# Patient Record
Sex: Female | Born: 1935 | Race: White | Hispanic: No | Smoking: Never smoker
Health system: Southern US, Community
[De-identification: ages and names within clinical notes are randomized; demographics above are authoritative.]

## PROBLEM LIST (undated history)

## (undated) DIAGNOSIS — Z9289 Personal history of other medical treatment: Secondary | ICD-10-CM

## (undated) DIAGNOSIS — K5730 Diverticulosis of large intestine without perforation or abscess without bleeding: Secondary | ICD-10-CM

## (undated) DIAGNOSIS — K254 Chronic or unspecified gastric ulcer with hemorrhage: Secondary | ICD-10-CM

## (undated) DIAGNOSIS — I10 Essential (primary) hypertension: Secondary | ICD-10-CM

## (undated) DIAGNOSIS — Z950 Presence of cardiac pacemaker: Secondary | ICD-10-CM

## (undated) DIAGNOSIS — C439 Malignant melanoma of skin, unspecified: Secondary | ICD-10-CM

## (undated) DIAGNOSIS — I459 Conduction disorder, unspecified: Secondary | ICD-10-CM

## (undated) DIAGNOSIS — M810 Age-related osteoporosis without current pathological fracture: Secondary | ICD-10-CM

## (undated) DIAGNOSIS — E785 Hyperlipidemia, unspecified: Secondary | ICD-10-CM

## (undated) DIAGNOSIS — R51 Headache: Secondary | ICD-10-CM

## (undated) DIAGNOSIS — I4891 Unspecified atrial fibrillation: Secondary | ICD-10-CM

## (undated) DIAGNOSIS — N951 Menopausal and female climacteric states: Secondary | ICD-10-CM

## (undated) DIAGNOSIS — C801 Malignant (primary) neoplasm, unspecified: Secondary | ICD-10-CM

## (undated) HISTORY — PX: BUNIONECTOMY: SHX129

## (undated) HISTORY — PX: CATARACT EXTRACTION W/ INTRAOCULAR LENS  IMPLANT, BILATERAL: SHX1307

## (undated) HISTORY — DX: Malignant melanoma of skin, unspecified: C43.9

## (undated) HISTORY — PX: APPENDECTOMY: SHX54

## (undated) HISTORY — DX: Age-related osteoporosis without current pathological fracture: M81.0

## (undated) HISTORY — DX: Conduction disorder, unspecified: I45.9

## (undated) HISTORY — PX: FRACTURE SURGERY: SHX138

## (undated) HISTORY — DX: Hyperlipidemia, unspecified: E78.5

## (undated) HISTORY — DX: Diverticulosis of large intestine without perforation or abscess without bleeding: K57.30

## (undated) HISTORY — PX: CHOLECYSTECTOMY OPEN: SUR202

## (undated) HISTORY — PX: PACEMAKER INSERTION: SHX728

## (undated) HISTORY — DX: Menopausal and female climacteric states: N95.1

---

## 1997-09-01 ENCOUNTER — Other Ambulatory Visit: Admission: RE | Admit: 1997-09-01 | Discharge: 1997-09-01 | Payer: Self-pay | Admitting: Obstetrics and Gynecology

## 1998-05-23 ENCOUNTER — Emergency Department (HOSPITAL_COMMUNITY): Admission: EM | Admit: 1998-05-23 | Discharge: 1998-05-23 | Payer: Self-pay | Admitting: Emergency Medicine

## 1998-05-23 ENCOUNTER — Encounter: Payer: Self-pay | Admitting: Emergency Medicine

## 2001-04-22 ENCOUNTER — Other Ambulatory Visit: Admission: RE | Admit: 2001-04-22 | Discharge: 2001-04-22 | Payer: Self-pay | Admitting: Internal Medicine

## 2003-01-06 ENCOUNTER — Encounter (INDEPENDENT_AMBULATORY_CARE_PROVIDER_SITE_OTHER): Payer: Self-pay

## 2003-01-06 ENCOUNTER — Observation Stay (HOSPITAL_COMMUNITY): Admission: RE | Admit: 2003-01-06 | Discharge: 2003-01-07 | Payer: Self-pay | Admitting: *Deleted

## 2004-04-05 ENCOUNTER — Other Ambulatory Visit: Admission: RE | Admit: 2004-04-05 | Discharge: 2004-04-05 | Payer: Self-pay | Admitting: Obstetrics and Gynecology

## 2004-05-20 ENCOUNTER — Ambulatory Visit: Payer: Self-pay | Admitting: Internal Medicine

## 2004-07-05 ENCOUNTER — Ambulatory Visit: Payer: Self-pay | Admitting: Internal Medicine

## 2004-12-14 ENCOUNTER — Ambulatory Visit: Payer: Self-pay | Admitting: Internal Medicine

## 2005-08-03 ENCOUNTER — Ambulatory Visit: Payer: Self-pay | Admitting: Internal Medicine

## 2005-08-10 ENCOUNTER — Ambulatory Visit: Payer: Self-pay | Admitting: Internal Medicine

## 2005-08-23 ENCOUNTER — Ambulatory Visit: Payer: Self-pay | Admitting: Internal Medicine

## 2005-09-05 ENCOUNTER — Ambulatory Visit: Payer: Self-pay | Admitting: Internal Medicine

## 2005-09-05 ENCOUNTER — Encounter: Payer: Self-pay | Admitting: Internal Medicine

## 2005-10-17 ENCOUNTER — Ambulatory Visit: Payer: Self-pay | Admitting: Internal Medicine

## 2006-07-10 ENCOUNTER — Encounter: Payer: Self-pay | Admitting: Internal Medicine

## 2006-07-10 LAB — HM MAMMOGRAPHY

## 2006-07-11 ENCOUNTER — Encounter: Payer: Self-pay | Admitting: Internal Medicine

## 2006-08-07 ENCOUNTER — Ambulatory Visit: Payer: Self-pay | Admitting: Internal Medicine

## 2006-08-07 LAB — CONVERTED CEMR LAB
ALT: 20 units/L (ref 0–40)
AST: 20 units/L (ref 0–37)
Albumin: 4 g/dL (ref 3.5–5.2)
Alkaline Phosphatase: 79 units/L (ref 39–117)
BUN: 9 mg/dL (ref 6–23)
Basophils Absolute: 0 10*3/uL (ref 0.0–0.1)
Basophils Relative: 0.8 % (ref 0.0–1.0)
Bilirubin, Direct: 0.1 mg/dL (ref 0.0–0.3)
CO2: 30 meq/L (ref 19–32)
Calcium: 9.7 mg/dL (ref 8.4–10.5)
Chloride: 106 meq/L (ref 96–112)
Cholesterol: 162 mg/dL (ref 0–200)
Creatinine, Ser: 1 mg/dL (ref 0.4–1.2)
Eosinophils Absolute: 0.1 10*3/uL (ref 0.0–0.6)
Eosinophils Relative: 2 % (ref 0.0–5.0)
GFR calc Af Amer: 70 mL/min
GFR calc non Af Amer: 57 mL/min
Glucose, Bld: 112 mg/dL — ABNORMAL HIGH (ref 70–99)
HCT: 41.9 % (ref 36.0–46.0)
HDL: 51.2 mg/dL (ref >39.0)
Hemoglobin: 14.6 g/dL (ref 12.0–15.0)
Hgb A1c MFr Bld: 6.2 % — ABNORMAL HIGH (ref 4.6–6.0)
LDL Cholesterol: 85 mg/dL (ref 0–99)
Lymphocytes Relative: 32.5 % (ref 12.0–46.0)
MCHC: 34.7 g/dL (ref 30.0–36.0)
MCV: 87.6 fL (ref 78.0–100.0)
Monocytes Absolute: 0.4 10*3/uL (ref 0.2–0.7)
Monocytes Relative: 6.9 % (ref 3.0–11.0)
Neutro Abs: 3.6 10*3/uL (ref 1.4–7.7)
Neutrophils Relative %: 57.8 % (ref 43.0–77.0)
Platelets: 245 10*3/uL (ref 150–400)
Potassium: 4.8 meq/L (ref 3.5–5.1)
RBC: 4.78 M/uL (ref 3.87–5.11)
RDW: 12.9 % (ref 11.5–14.6)
Sodium: 141 meq/L (ref 135–145)
TSH: 1.59 microintl units/mL (ref 0.35–5.50)
Total Bilirubin: 0.9 mg/dL (ref 0.3–1.2)
Total CHOL/HDL Ratio: 3.2
Total Protein: 7.2 g/dL (ref 6.0–8.3)
Triglycerides: 131 mg/dL (ref 0–149)
VLDL: 26 mg/dL (ref 0–40)
WBC: 6.1 10*3/uL (ref 4.5–10.5)

## 2006-08-14 ENCOUNTER — Ambulatory Visit: Payer: Self-pay | Admitting: Internal Medicine

## 2006-11-22 ENCOUNTER — Encounter: Payer: Self-pay | Admitting: Internal Medicine

## 2006-11-22 ENCOUNTER — Ambulatory Visit: Payer: Self-pay | Admitting: Internal Medicine

## 2006-11-22 DIAGNOSIS — K5730 Diverticulosis of large intestine without perforation or abscess without bleeding: Secondary | ICD-10-CM | POA: Insufficient documentation

## 2006-11-22 DIAGNOSIS — E785 Hyperlipidemia, unspecified: Secondary | ICD-10-CM

## 2006-11-22 DIAGNOSIS — M810 Age-related osteoporosis without current pathological fracture: Secondary | ICD-10-CM | POA: Insufficient documentation

## 2006-11-22 HISTORY — DX: Age-related osteoporosis without current pathological fracture: M81.0

## 2006-11-22 HISTORY — DX: Diverticulosis of large intestine without perforation or abscess without bleeding: K57.30

## 2006-11-22 HISTORY — DX: Hyperlipidemia, unspecified: E78.5

## 2007-03-21 ENCOUNTER — Ambulatory Visit: Payer: Self-pay | Admitting: Internal Medicine

## 2007-10-20 ENCOUNTER — Encounter: Payer: Self-pay | Admitting: Internal Medicine

## 2007-10-21 ENCOUNTER — Ambulatory Visit: Payer: Self-pay | Admitting: Internal Medicine

## 2007-10-21 DIAGNOSIS — R300 Dysuria: Secondary | ICD-10-CM | POA: Insufficient documentation

## 2007-10-21 LAB — CONVERTED CEMR LAB
ALT: 18 units/L (ref 0–35)
AST: 20 units/L (ref 0–37)
Albumin: 4 g/dL (ref 3.5–5.2)
Alkaline Phosphatase: 74 units/L (ref 39–117)
BUN: 12 mg/dL (ref 6–23)
Basophils Absolute: 0 10*3/uL (ref 0.0–0.1)
Basophils Relative: 0.6 % (ref 0.0–1.0)
Bilirubin, Direct: 0.1 mg/dL (ref 0.0–0.3)
CO2: 28 meq/L (ref 19–32)
Calcium: 9.3 mg/dL (ref 8.4–10.5)
Chloride: 108 meq/L (ref 96–112)
Cholesterol: 231 mg/dL (ref 0–200)
Creatinine, Ser: 0.9 mg/dL (ref 0.4–1.2)
Direct LDL: 159.2 mg/dL
Eosinophils Absolute: 0.1 10*3/uL (ref 0.0–0.7)
Eosinophils Relative: 1.9 % (ref 0.0–5.0)
GFR calc Af Amer: 78 mL/min
GFR calc non Af Amer: 65 mL/min
Glucose, Bld: 98 mg/dL (ref 70–99)
HCT: 41 % (ref 36.0–46.0)
HDL: 44 mg/dL (ref >39.0)
Hemoglobin: 14 g/dL (ref 12.0–15.0)
Lymphocytes Relative: 33.2 % (ref 12.0–46.0)
MCHC: 34.2 g/dL (ref 30.0–36.0)
MCV: 88.1 fL (ref 78.0–100.0)
Monocytes Absolute: 0.4 10*3/uL (ref 0.1–1.0)
Monocytes Relative: 6 % (ref 3.0–12.0)
Neutro Abs: 3.6 10*3/uL (ref 1.4–7.7)
Neutrophils Relative %: 58.3 % (ref 43.0–77.0)
Platelets: 223 10*3/uL (ref 150–400)
Potassium: 4.5 meq/L (ref 3.5–5.1)
RBC: 4.66 M/uL (ref 3.87–5.11)
RDW: 13.4 % (ref 11.5–14.6)
Sodium: 143 meq/L (ref 135–145)
TSH: 1.68 microintl units/mL (ref 0.35–5.50)
Total Bilirubin: 0.8 mg/dL (ref 0.3–1.2)
Total CHOL/HDL Ratio: 5.3
Total Protein: 7.1 g/dL (ref 6.0–8.3)
Triglycerides: 128 mg/dL (ref 0–149)
VLDL: 26 mg/dL (ref 0–40)
WBC: 6.2 10*3/uL (ref 4.5–10.5)

## 2007-11-25 ENCOUNTER — Ambulatory Visit: Payer: Self-pay | Admitting: Internal Medicine

## 2008-01-09 ENCOUNTER — Ambulatory Visit: Payer: Self-pay | Admitting: Internal Medicine

## 2008-01-09 DIAGNOSIS — R519 Headache, unspecified: Secondary | ICD-10-CM | POA: Insufficient documentation

## 2008-01-09 DIAGNOSIS — R51 Headache: Secondary | ICD-10-CM | POA: Insufficient documentation

## 2008-02-12 ENCOUNTER — Encounter: Payer: Self-pay | Admitting: Internal Medicine

## 2008-02-24 ENCOUNTER — Encounter: Payer: Self-pay | Admitting: Internal Medicine

## 2008-02-24 ENCOUNTER — Ambulatory Visit: Payer: Self-pay | Admitting: Internal Medicine

## 2008-02-25 LAB — CONVERTED CEMR LAB
AST: 16 units/L (ref 0–37)
Cholesterol: 263 mg/dL (ref 0–200)
Direct LDL: 176 mg/dL

## 2008-06-16 ENCOUNTER — Ambulatory Visit: Payer: Self-pay | Admitting: Internal Medicine

## 2008-06-16 DIAGNOSIS — R079 Chest pain, unspecified: Secondary | ICD-10-CM | POA: Insufficient documentation

## 2008-07-07 ENCOUNTER — Encounter: Payer: Self-pay | Admitting: Internal Medicine

## 2008-07-07 ENCOUNTER — Ambulatory Visit: Payer: Self-pay

## 2008-08-18 ENCOUNTER — Encounter: Payer: Self-pay | Admitting: Internal Medicine

## 2009-02-18 ENCOUNTER — Ambulatory Visit: Payer: Self-pay | Admitting: Internal Medicine

## 2009-07-27 ENCOUNTER — Ambulatory Visit: Payer: Self-pay | Admitting: Internal Medicine

## 2009-08-31 ENCOUNTER — Encounter: Payer: Self-pay | Admitting: Internal Medicine

## 2010-01-28 ENCOUNTER — Ambulatory Visit: Payer: Self-pay | Admitting: Internal Medicine

## 2010-01-28 DIAGNOSIS — N390 Urinary tract infection, site not specified: Secondary | ICD-10-CM | POA: Insufficient documentation

## 2010-06-19 LAB — CONVERTED CEMR LAB
ALT: 17 units/L (ref 0–35)
AST: 18 units/L (ref 0–37)
Albumin: 4.3 g/dL (ref 3.5–5.2)
Alkaline Phosphatase: 79 units/L (ref 39–117)
BUN: 7 mg/dL (ref 6–23)
Basophils Absolute: 0 10*3/uL (ref 0.0–0.1)
Basophils Relative: 0.8 % (ref 0.0–3.0)
Bilirubin Urine: NEGATIVE
Bilirubin, Direct: 0.1 mg/dL (ref 0.0–0.3)
Blood in Urine, dipstick: NEGATIVE
CK-MB: 1.8 ng/mL (ref 0.3–4.0)
CO2: 29 meq/L (ref 19–32)
Calcium: 9.5 mg/dL (ref 8.4–10.5)
Chloride: 106 meq/L (ref 96–112)
Cholesterol: 170 mg/dL (ref 0–200)
Creatinine, Ser: 0.9 mg/dL (ref 0.4–1.2)
Eosinophils Absolute: 0.1 10*3/uL (ref 0.0–0.7)
Eosinophils Relative: 2.1 % (ref 0.0–5.0)
GFR calc non Af Amer: 64.28 mL/min (ref >60)
Glucose, Bld: 96 mg/dL (ref 70–99)
Glucose, Urine, Semiquant: NEGATIVE
HCT: 42.7 % (ref 36.0–46.0)
HDL: 56.1 mg/dL (ref >39.00)
Hemoglobin: 13.9 g/dL (ref 12.0–15.0)
Ketones, urine, test strip: NEGATIVE
LDL Cholesterol: 89 mg/dL (ref 0–99)
Lymphocytes Relative: 30.8 % (ref 12.0–46.0)
Lymphs Abs: 1.6 10*3/uL (ref 0.7–4.0)
MCHC: 32.5 g/dL (ref 30.0–36.0)
MCV: 90.9 fL (ref 78.0–100.0)
Monocytes Absolute: 0.4 10*3/uL (ref 0.1–1.0)
Monocytes Relative: 7 % (ref 3.0–12.0)
Neutro Abs: 3.2 10*3/uL (ref 1.4–7.7)
Neutrophils Relative %: 59.3 % (ref 43.0–77.0)
Nitrite: NEGATIVE
Platelets: 202 10*3/uL (ref 150.0–400.0)
Potassium: 4.4 meq/L (ref 3.5–5.1)
Protein, U semiquant: NEGATIVE
RBC: 4.69 M/uL (ref 3.87–5.11)
RDW: 13.1 % (ref 11.5–14.6)
Relative Index: 2.5 (ref 0.0–2.5)
Sodium: 143 meq/L (ref 135–145)
Specific Gravity, Urine: 1.01
TSH: 1.33 microintl units/mL (ref 0.35–5.50)
Total Bilirubin: 0.6 mg/dL (ref 0.3–1.2)
Total CHOL/HDL Ratio: 3
Total CK: 72 units/L (ref 7–177)
Total Protein: 7.8 g/dL (ref 6.0–8.3)
Triglycerides: 127 mg/dL (ref 0.0–149.0)
Urobilinogen, UA: NEGATIVE
VLDL: 25.4 mg/dL (ref 0.0–40.0)
WBC Urine, dipstick: NEGATIVE
WBC: 5.3 10*3/uL (ref 4.5–10.5)
pH: 5

## 2010-06-21 NOTE — Assessment & Plan Note (Signed)
Summary: emp/pt fasting/cjr   Vital Signs:  Patient profile:   77 year old female Height:      64.5 inches Weight:      155 pounds BMI:     26.29 Temp:     97.8 degrees F oral BP sitting:   126 / 70  (right arm) Cuff size:   regular  Vitals Entered By: Duard Brady LPN (July 28, 2954 8:34 AM) CC: cpx - fasting for labs , GYN - Dr. Derrel Nip - doing well Is Patient Diabetic? No   CC:  cpx - fasting for labs  and GYN - Dr. Derrel Nip - doing well.  History of Present Illness: 77 year old patient who is seen today for a comprehensive evaluation.  Medical problems include dyslipidemia.  She presents on Lipitor 20 mg daily.  She has osteoporosis and a history of diverticulosis.  She is doing quite well.  One year ago, had an exercise Cardiolite stress test that was unremarkable.  She denies any cardiopulmonary complaints center last visit here.  She has had a squamous cell skin cancer, resected from her left leg.  She has occasional headaches, which have been stable.  She does receive annual gynecologic care and mammograms.  Preventive Screening-Counseling & Management  Alcohol-Tobacco     Smoking Status: never  Allergies (verified): No Known Drug Allergies  Past History:  Past Medical History: Reviewed history from 11/22/2006 and no changes required. Diverticulosis, colon Hyperlipidemia Menopausal syndrome Osteoporosis  Past Surgical History: Appendectomy Cholecystectomy 2004 Fx'd shoulder surgery in 1989 bunionectomy 1984 colonoscopy 2007 status post squamous cell skin cancer resection, left leg 2010 Cardiolite stress test February 2010  Family History: Reviewed history from 10/21/2007 and no changes required. father died of an  MI at age 11 mother died age 83, heart failure  Four brothers status post CABG; one deceased of bone cancer; one deceased from complications of COPD  Two sisters; positive for bipolar depression and senile dementia(deceased)  Social  History: Reviewed history from 10/21/2007 and no changes required. Married Never Smoked (Beth died 01/19/09)  Review of Systems  The patient denies anorexia, fever, weight loss, weight gain, vision loss, decreased hearing, hoarseness, chest pain, syncope, dyspnea on exertion, peripheral edema, prolonged cough, headaches, hemoptysis, abdominal pain, melena, hematochezia, severe indigestion/heartburn, hematuria, incontinence, genital sores, muscle weakness, suspicious skin lesions, transient blindness, difficulty walking, depression, unusual weight change, abnormal bleeding, enlarged lymph nodes, angioedema, and breast masses.    Physical Exam  General:  Well-developed,well-nourished,in no acute distress; alert,appropriate and cooperative throughout examination Head:  Normocephalic and atraumatic without obvious abnormalities. No apparent alopecia or balding. Eyes:  No corneal or conjunctival inflammation noted. EOMI. Perrla. Funduscopic exam benign, without hemorrhages, exudates or papilledema. Vision grossly normal. Ears:  External ear exam shows no significant lesions or deformities.  Otoscopic examination reveals clear canals, tympanic membranes are intact bilaterally without bulging, retraction, inflammation or discharge. Hearing is grossly normal bilaterally. Nose:  External nasal examination shows no deformity or inflammation. Nasal mucosa are pink and moist without lesions or exudates. Mouth:  Oral mucosa and oropharynx without lesions or exudates.  Teeth in good repair. Neck:  No deformities, masses, or tenderness noted. Chest Wall:  No deformities, masses, or tenderness noted. Breasts:  No mass, nodules, thickening, tenderness, bulging, retraction, inflamation, nipple discharge or skin changes noted.   Lungs:  Normal respiratory effort, chest expands symmetrically. Lungs are clear to auscultation, no crackles or wheezes. Heart:  Normal rate and regular rhythm. S1 and S2 normal without  gallop, murmur, click, rub or other extra sounds. Abdomen:  Bowel sounds positive,abdomen soft and non-tender without masses, organomegaly or hernias noted. Msk:  No deformity or scoliosis noted of thoracic or lumbar spine.   Pulses:  diminished right posterior tibial pulse Extremities:  No clubbing, cyanosis, edema, or deformity noted with normal full range of motion of all joints.   Neurologic:  No cranial nerve deficits noted. Station and gait are normal. Plantar reflexes are down-going bilaterally. DTRs are symmetrical throughout. Sensory, motor and coordinative functions appear intact. Skin:  Intact without suspicious lesions or rashes Cervical Nodes:  No lymphadenopathy noted Axillary Nodes:  No palpable lymphadenopathy Inguinal Nodes:  No significant adenopathy Psych:  Cognition and judgment appear intact. Alert and cooperative with normal attention span and concentration. No apparent delusions, illusions, hallucinations   Impression & Recommendations:  Problem # 1:  OSTEOPOROSIS (ICD-733.00)  Her updated medication list for this problem includes:    Boniva 150 Mg Tabs (Ibandronate sodium) ..... One monthly  Orders: TLB-BMP (Basic Metabolic Panel-BMET) (80048-METABOL) TLB-CBC Platelet - w/Differential (85025-CBCD) TLB-Hepatic/Liver Function Pnl (80076-HEPATIC)  Problem # 2:  HYPERLIPIDEMIA (ICD-272.4)  The following medications were removed from the medication list:    Pravastatin Sodium 40 Mg Tabs (Pravastatin sodium) ..... One daily Her updated medication list for this problem includes:    Lipitor 40 Mg Tabs (Atorvastatin calcium) ..... Use daily  Orders: Prescription Created Electronically [phone]) Venipuncture [phone]) TLB-Lipid Panel (80061-LIPID) TLB-BMP (Basic Metabolic Panel-BMET) (80048-METABOL) TLB-CBC Platelet - w/Differential (85025-CBCD) TLB-Hepatic/Liver Function Pnl (80076-HEPATIC) TLB-TSH (Thyroid Stimulating Hormone) (84443-TSH)  Problem # 3:  HEADACHE  (ICD-784.0)  The following medications were removed from the medication list:    Butalbital-apap-caffeine 50-325-40 Mg Tabs (Butalbital-apap-caffeine) ..... One or 6 hours as needed for pain  Orders: TLB-BMP (Basic Metabolic Panel-BMET) (80048-METABOL) TLB-CBC Platelet - w/Differential (85025-CBCD) TLB-Hepatic/Liver Function Pnl (80076-HEPATIC)  Problem # 4:  DYSURIA (ICD-788.1)  Orders: TLB-BMP (Basic Metabolic Panel-BMET) (80048-METABOL) TLB-CBC Platelet - w/Differential (85025-CBCD) TLB-Hepatic/Liver Function Pnl (80076-HEPATIC)  Complete Medication List: 1)  Boniva 150 Mg Tabs (Ibandronate sodium) .... One monthly 2)  Lipitor 40 Mg Tabs (Atorvastatin calcium) .... Use daily  Other Orders: EKG w/ Interpretation (93000)  Patient Instructions: 1)  Please schedule a follow-up appointment in 1 year. 2)  Limit your Sodium (Salt). 3)  It is important that you exercise regularly at least 20 minutes 5 times a week. If you develop chest pain, have severe difficulty breathing, or feel very tired , stop exercising immediately and seek medical attention. 4)  Take calcium +Vitamin D daily. Prescriptions: LIPITOR 40 MG TABS (ATORVASTATIN CALCIUM) use daily  #90 x 6   Entered and Authorized by:   Gordy Savers  MD   Signed by:   Gordy Savers  MD on 07/27/2009   Method used:   Print then Give to Patient   RxID:   [id number] BONIVA 150 MG  TABS (IBANDRONATE SODIUM) one monthly  #3 x 6   Entered and Authorized by:   Gordy Savers  MD   Signed by:   Gordy Savers  MD on 07/27/2009   Method used:   Print then Give to Patient   RxID:   [id number]

## 2010-06-21 NOTE — Assessment & Plan Note (Signed)
Summary: UTI // RS   Vital Signs:  Patient profile:   77 year old female Weight:      153 pounds Temp:     98.1 degrees F oral BP sitting:   120 / 80  (right arm) Cuff size:   regular  Vitals Entered By: Duard Brady LPN (January 28, 2010 3:46 PM) CC: c/o dysuria, blood in urine Is Patient Diabetic? No Flu Vaccine Consent Questions     Do you have a history of severe allergic reactions to this vaccine? no    Any prior history of allergic reactions to egg and/or gelatin? no    Do you have a sensitivity to the preservative Thimersol? no    Do you have a past history of Guillan-Barre Syndrome? no    Do you currently have an acute febrile illness? no    Have you ever had a severe reaction to latex? no    Vaccine information given and explained to patient? yes    Are you currently pregnant? no    Lot Number:[id number]   Exp Date:11/19/2010   Site Given  Left Deltoid IM   CC:  c/o dysuria and blood in urine.  History of Present Illness:  77 year old patient who presented with a 3-day history of urinary frequency and dysuria.  No fever, chills, or flank pain.  She is scheduled for cataract extraction surgery and lens implantation in a few days.  No drug allergies. she has treated dyslipidemia, and osteoporosis  Allergies (verified): No Known Drug Allergies  Physical Exam  General:  Well-developed,well-nourished,in no acute distress; alert,appropriate and cooperative throughout examination   Impression & Recommendations:  Problem # 1:  UTI (ICD-599.0)  Her updated medication list for this problem includes:    Ciprofloxacin Hcl 500 Mg Tabs (Ciprofloxacin hcl) ..... One twice daily  Complete Medication List: 1)  Boniva 150 Mg Tabs (Ibandronate sodium) .... One monthly 2)  Lipitor 40 Mg Tabs (Atorvastatin calcium) .... Use daily 3)  Ciprofloxacin Hcl 500 Mg Tabs (Ciprofloxacin hcl) .... One twice daily  Other Orders: UA Dipstick w/o Micro (manual) (60454) Flu  Vaccine 14yrs + MEDICARE PATIENTS (U9811) Administration Flu vaccine - MCR (B1478)  Patient Instructions: 1)  Drink as much fluid as you can tolerate for the next few days. 2)  Take your antibiotic as prescribed until ALL of it is gone, but stop if you develop a rash or swelling and contact our office as soon as possible. 3)  Take ciprofloxin twice daily for 5 days Prescriptions: CIPROFLOXACIN HCL 500 MG TABS (CIPROFLOXACIN HCL) one twice daily  #20 x 0   Entered and Authorized by:   Gordy Savers  MD   Signed by:   Gordy Savers  MD on 01/28/2010   Method used:   Print then Give to Patient   RxID:   [id number]

## 2010-09-09 ENCOUNTER — Encounter: Payer: Self-pay | Admitting: Internal Medicine

## 2010-10-07 NOTE — Assessment & Plan Note (Signed)
 HEALTHCARE                            [facility] OFFICE NOTE   NAME:[name] [name] KOSKELA                          MRN:          [id number]  DATE:08/14/2006                            DOB:          Aug 30, 1928    A 77 year old patient seen today for an annual exam.  She has  hypercholesterolemia, menopausal syndrome, osteopenia.  She is doing  quite well, is followed annually by her gynecologist.  She has no  concerns or complaints today.  She has tried Actonel in the past and had  some severe dyspepsia.  Did have colonoscopy last year.  Has history of  diverticulosis as well.   FAMILY HISTORY:  Unchanged.  Father died young of MI.  Mother died of  complications of congestive heart failure.  One brother is status post  CABG.   PHYSICAL EXAMINATION:  GENERAL APPEARANCE:  A healthy-appearing fit  white female in no acute distress.  She appeared younger than her stated  age.  VITAL SIGNS:  Blood pressure was 140/80.  HEENT:  Fundi, ears, nose and throat clear.  NECK:  No bruits or adenopathy.  CHEST:  Clear.  BREASTS:  Negative.  CARDIOVASCULAR:  Normal heart sounds, no murmurs.  ABDOMEN:  Benign, no organomegaly.  EXTREMITIES:  Negative.  Peripheral pulses were full.   IMPRESSION:  1. Menopausal syndrome.  2. Hypercholesterolemia.  3. History of diverticulosis and osteopenia.   DISPOSITION:  Will give a trial of Boniva to use monthly.  Will pretreat  with a PPI.  Will reassess in one year.  Is scheduled for gynecologic  follow-up soon.     Gordy Savers, MD  Electronically Signed    PFK/MedQ  DD: 08/14/2006  DT: 08/14/2006  Job #: [id number]

## 2010-10-07 NOTE — Op Note (Signed)
   NAMEEARLA, CHARLIE                             ACCOUNT NO.:  [id number]   MEDICAL RECORD NO.:  [id number]                   PATIENT TYPE:  OBV   LOCATION:  0479                                 FACILITY:  [facility]   PHYSICIAN:  Vikki Ports, M.D.         DATE OF BIRTH:  1928/06/30   DATE OF PROCEDURE:  01/06/2003  DATE OF DISCHARGE:                                 OPERATIVE REPORT   PREOPERATIVE DIAGNOSIS:  Biliary dyskinesia.   POSTOPERATIVE DIAGNOSIS:  Biliary dyskinesia.   OPERATION/PROCEDURE:  Laparoscopic cholecystectomy.   SURGEON:  Vikki Ports, M.D.   ASSISTANTSheppard Plumber. Earlene Plater, M.D.   ANESTHESIA:  General.   DESCRIPTION OF PROCEDURE:  The patient was taken to the operating room and  placed in the supine position. After adequate general anesthesia was induced  using the endotracheal tube, the abdomen was prepped and draped in the  normal sterile fashion.  Using a transverse infraumbilical incision, I  dissected down to the fascia.  The fascia was opened vertically.  An 0  Vicryl pursestring suture was placed around the fascial defect and Hasson  trocar was placed in the abdomen.  Abdomen was insufflated with continuous-  flow CO2 to a measurement of 15 mmHg.  Under direct visualization a 10 mm  port was placed in the right subxiphoid region.  Two 5 mm ports were placed  in the right abdomen.  Gallbladder was identified and retracted cephalad.  Adhesions were taken down off the lateral wall of the gallbladder.  Infundibulum was retracted laterally.  Cystic duct was identified at its  junction with the gallbladder.  Common duct was verified.  The triangle of  Calot was identified and verified.  The cystic duct was then triply clipped  and divided.  The cystic artery was dissected in the similar fashion, triply  clipped and divided.  Gallbladder was then taken off then taken off the  gallbladder bed using Bovie electrocautery and removed through the  umbilicus  port.  Adequate hemostasis was insured.  Pneumoperitoneum was released.  The  infraumbilical fascial defect was closed with 3-0 Vicryl pursestring suture.  Skin incisions were closed with subcuticular 4-0 Monocryl.  Steri-Strips and  sterile dressings were applied.  Incisions were injected with Marcaine prior  to closure.  The patient tolerated the procedure well and went to PACU in  good condition.                                                Vikki Ports, M.D.    KRH/MEDQ  D:  01/06/2003  T:  01/06/2003  Job:  [id number]

## 2010-10-14 ENCOUNTER — Encounter: Payer: Self-pay | Admitting: Internal Medicine

## 2010-10-18 ENCOUNTER — Ambulatory Visit (INDEPENDENT_AMBULATORY_CARE_PROVIDER_SITE_OTHER): Payer: Medicare Other | Admitting: Internal Medicine

## 2010-10-18 ENCOUNTER — Encounter: Payer: Self-pay | Admitting: Internal Medicine

## 2010-10-18 VITALS — BP 124/80 | HR 60 | Temp 97.9°F | Resp 16 | Ht 64.5 in | Wt 154.0 lb

## 2010-10-18 DIAGNOSIS — Z23 Encounter for immunization: Secondary | ICD-10-CM

## 2010-10-18 DIAGNOSIS — E785 Hyperlipidemia, unspecified: Secondary | ICD-10-CM

## 2010-10-18 DIAGNOSIS — Z0000 Encounter for general adult medical examination without abnormal findings: Secondary | ICD-10-CM

## 2010-10-18 DIAGNOSIS — K5730 Diverticulosis of large intestine without perforation or abscess without bleeding: Secondary | ICD-10-CM

## 2010-10-18 DIAGNOSIS — M810 Age-related osteoporosis without current pathological fracture: Secondary | ICD-10-CM

## 2010-10-18 LAB — HEPATIC FUNCTION PANEL
AST: 20 U/L (ref 0–37)
Alkaline Phosphatase: 75 U/L (ref 39–117)
Bilirubin, Direct: 0.1 mg/dL (ref 0.0–0.3)
Total Bilirubin: 0.5 mg/dL (ref 0.3–1.2)

## 2010-10-18 LAB — BASIC METABOLIC PANEL
BUN: 9 mg/dL (ref 6–23)
Chloride: 106 mEq/L (ref 96–112)
GFR: 65.76 mL/min (ref >60.00)
Potassium: 4.6 mEq/L (ref 3.5–5.1)
Sodium: 142 mEq/L (ref 135–145)

## 2010-10-18 LAB — CBC WITH DIFFERENTIAL/PLATELET
Basophils Absolute: 0 10*3/uL (ref 0.0–0.1)
Eosinophils Absolute: 0.1 10*3/uL (ref 0.0–0.7)
Lymphocytes Relative: 33.6 % (ref 12.0–46.0)
MCHC: 33.5 g/dL (ref 30.0–36.0)
MCV: 90.2 fl (ref 78.0–100.0)
Monocytes Absolute: 0.4 10*3/uL (ref 0.1–1.0)
Neutrophils Relative %: 56.2 % (ref 43.0–77.0)
Platelets: 191 10*3/uL (ref 150.0–400.0)
RDW: 14.3 % (ref 11.5–14.6)

## 2010-10-18 LAB — LIPID PANEL
Cholesterol: 176 mg/dL (ref 0–200)
LDL Cholesterol: 89 mg/dL (ref 0–99)
Total CHOL/HDL Ratio: 3
VLDL: 31.4 mg/dL (ref 0.0–40.0)

## 2010-10-18 MED ORDER — ATORVASTATIN CALCIUM 40 MG PO TABS: 40.0000 mg | ORAL_TABLET | Freq: Every day | ORAL | Status: DC

## 2010-10-18 NOTE — Progress Notes (Signed)
Subjective:    Patient ID: [name] Small, female    DOB: Jul 05, 1928, 77 y.o.   MRN: [id number]  HPI  77 year old patient who is seen today for a health maintenance examination. Medical problems include dyslipidemia. She also has a history of osteoporosis but self discontinued Boniva approximately 6 months ago;  no concerns or complaints.  1. Risk factors, based on past  M,S,F history-  cardiovascular risk factors include dyslipidemia  2.  Physical activities: Remains quite active still works part-time at [employer]  3.  Depression/mood: No history of depression or mood disorder  4.  Hearing: No deficits  5.  ADL's: Independent in all aspects of daily living  6.  Fall risk: Low 7.  Home safety: No problems identified 8.  Height weight, and visual acuity; height and weight stable no change in visual acuity has had bilateral cataract extraction surgery  9.  Counseling: Heart healthy diet calcium vitamin D regular exercise are encouraged  10. Lab orders based on risk factors: Temperature profile including lipid panel will be reviewed 11. Referral: Not appropriate at this time  12. Care plan: Continue calcium and vitamin D supplement she has had a mammogram this year. Heart healthy diet and regular exercise all encouraged  13. Cognitive assessment: Alert and oriented with normal affect. No cognitive dysfunction       Review of Systems  Constitutional: Negative for fever, appetite change, fatigue and unexpected weight change.  HENT: Negative for hearing loss, ear pain, nosebleeds, congestion, sore throat, mouth sores, trouble swallowing, neck stiffness, dental problem, voice change, sinus pressure and tinnitus.   Eyes: Negative for photophobia, pain, redness and visual disturbance.  Respiratory: Negative for cough, chest tightness and shortness of breath.   Cardiovascular: Negative for chest pain, palpitations and leg swelling.  Gastrointestinal: Negative for nausea, vomiting,  abdominal pain, diarrhea, constipation, blood in stool, abdominal distention and rectal pain.  Genitourinary: Negative for dysuria, urgency, frequency, hematuria, flank pain, vaginal bleeding, vaginal discharge, difficulty urinating, genital sores, vaginal pain, menstrual problem and pelvic pain.  Musculoskeletal: Negative for back pain and arthralgias.  Skin: Negative for rash.  Neurological: Negative for dizziness, syncope, speech difficulty, weakness, light-headedness, numbness and headaches.  Hematological: Negative for adenopathy. Does not bruise/bleed easily.  Psychiatric/Behavioral: Negative for suicidal ideas, behavioral problems, self-injury, dysphoric mood and agitation. The patient is not nervous/anxious.        Objective:   Physical Exam  Constitutional: She is oriented to person, place, and time. She appears well-developed and well-nourished.  HENT:  Head: Normocephalic and atraumatic.  Right Ear: External ear normal.  Left Ear: External ear normal.  Mouth/Throat: Oropharynx is clear and moist.  Eyes: Conjunctivae and EOM are normal.  Neck: Normal range of motion. Neck supple. No JVD present. No thyromegaly present.  Cardiovascular: Normal rate, regular rhythm, normal heart sounds and intact distal pulses.   No murmur heard.      Pedal pulses full  Pulmonary/Chest: Effort normal and breath sounds normal. She has no wheezes. She has no rales.  Abdominal: Soft. Bowel sounds are normal. She exhibits no distension and no mass. There is no tenderness. There is no rebound and no guarding.  Musculoskeletal: Normal range of motion. She exhibits no edema and no tenderness.  Neurological: She is alert and oriented to person, place, and time. She has normal reflexes. No cranial nerve deficit. She exhibits normal muscle tone. Coordination normal.  Skin: Skin is warm and dry. No rash noted.  Psychiatric: She has a  normal mood and affect. Her behavior is normal.          Assessment  & Plan:   Annual health assessment Dyslipidemia Osteoporosis. We'll check a bone density study in one year. We'll continue calcium and vitamin D supplements and regular exercise

## 2010-10-18 NOTE — Patient Instructions (Signed)
Limit your sodium (Salt) intake  Take a calcium supplement, plus 800-1200 units of vitamin D    It is important that you exercise regularly, at least 20 minutes 3 to 4 times per week.  If you develop chest pain or shortness of breath seek  medical attention.  Return in one year for follow-up   

## 2010-11-24 ENCOUNTER — Telehealth: Payer: Self-pay | Admitting: Internal Medicine

## 2010-11-24 NOTE — Telephone Encounter (Signed)
Pt aware - request I mail letter to home. KIK

## 2010-11-24 NOTE — Telephone Encounter (Signed)
In order for the patient's insurance company to pay for her  Tetanus injection; she is requesting a letter from her primary doctor stating that the injection was required.

## 2010-12-09 ENCOUNTER — Encounter: Payer: Self-pay | Admitting: Internal Medicine

## 2010-12-09 ENCOUNTER — Ambulatory Visit (INDEPENDENT_AMBULATORY_CARE_PROVIDER_SITE_OTHER): Payer: Medicare Other | Admitting: Internal Medicine

## 2010-12-09 VITALS — BP 130/84 | Temp 98.1°F | Wt 155.0 lb

## 2010-12-09 DIAGNOSIS — L259 Unspecified contact dermatitis, unspecified cause: Secondary | ICD-10-CM

## 2010-12-09 DIAGNOSIS — L308 Other specified dermatitis: Secondary | ICD-10-CM

## 2010-12-09 MED ORDER — NYSTATIN-TRIAMCINOLONE 100000-0.1 UNIT/GM-% EX CREA: TOPICAL_CREAM | Freq: Four times a day (QID) | CUTANEOUS | Status: DC

## 2010-12-09 NOTE — Patient Instructions (Signed)
Use cream as directed  Keep the area dry and well ventilated  Call or return to clinic prn if these symptoms worsen or fail to improve as anticipated.

## 2010-12-09 NOTE — Progress Notes (Signed)
  Subjective:    Patient ID: [name] Small, female    DOB: 12-26-1928, 77 y.o.   MRN: [id number]  HPI  77 year old patient who presents with a two-week history of a red pruritic rash involving both axillary areas. She has been using an antibiotic ointment without benefit. She has discontinued use of a deodorant.   Review of Systems  Skin: Positive for rash.       Objective:   Physical Exam  Constitutional: She appears well-developed and well-nourished. No distress.  Skin:       Both axilla were erythematous          Assessment & Plan:   Axillary dermatitis. Will treat with nystatin triamcinolone

## 2011-03-02 ENCOUNTER — Ambulatory Visit (INDEPENDENT_AMBULATORY_CARE_PROVIDER_SITE_OTHER): Payer: Medicare Other | Admitting: Internal Medicine

## 2011-03-02 DIAGNOSIS — Z0000 Encounter for general adult medical examination without abnormal findings: Secondary | ICD-10-CM

## 2011-03-02 DIAGNOSIS — Z23 Encounter for immunization: Secondary | ICD-10-CM

## 2011-05-26 ENCOUNTER — Telehealth: Payer: Self-pay | Admitting: Internal Medicine

## 2011-05-26 MED ORDER — CIPROFLOXACIN HCL 500 MG PO TABS: 500.0000 mg | ORAL_TABLET | Freq: Two times a day (BID) | ORAL | Status: AC

## 2011-05-26 NOTE — Telephone Encounter (Signed)
Okay per Dr Kirtland Bouchard. Rx sent

## 2011-05-26 NOTE — Telephone Encounter (Signed)
Pt is at the pharmacy with another uti and would like a refill of Cipro called to [facility].

## 2011-09-16 ENCOUNTER — Observation Stay (HOSPITAL_COMMUNITY)
Admission: EM | Admit: 2011-09-16 | Discharge: 2011-09-17 | Disposition: A | Discharge status: Home / Self Care | Payer: Medicare Other | Attending: Internal Medicine | Admitting: Internal Medicine

## 2011-09-16 ENCOUNTER — Emergency Department (HOSPITAL_COMMUNITY): Payer: Medicare Other

## 2011-09-16 ENCOUNTER — Encounter (HOSPITAL_COMMUNITY): Payer: Self-pay | Admitting: Emergency Medicine

## 2011-09-16 DIAGNOSIS — E860 Dehydration: Principal | ICD-10-CM | POA: Insufficient documentation

## 2011-09-16 DIAGNOSIS — R002 Palpitations: Secondary | ICD-10-CM | POA: Diagnosis present

## 2011-09-16 DIAGNOSIS — R0789 Other chest pain: Secondary | ICD-10-CM | POA: Insufficient documentation

## 2011-09-16 DIAGNOSIS — M810 Age-related osteoporosis without current pathological fracture: Secondary | ICD-10-CM | POA: Insufficient documentation

## 2011-09-16 DIAGNOSIS — E785 Hyperlipidemia, unspecified: Secondary | ICD-10-CM | POA: Insufficient documentation

## 2011-09-16 HISTORY — DX: Malignant (primary) neoplasm, unspecified: C80.1

## 2011-09-16 HISTORY — DX: Headache: R51

## 2011-09-16 LAB — DIFFERENTIAL
Basophils Absolute: 0 10*3/uL (ref 0.0–0.1)
Basophils Relative: 1 % (ref 0–1)
Eosinophils Absolute: 0.1 10*3/uL (ref 0.0–0.7)
Eosinophils Relative: 1 % (ref 0–5)
Lymphocytes Relative: 16 % (ref 12–46)
Lymphs Abs: 1.4 10*3/uL (ref 0.7–4.0)
Monocytes Absolute: 0.7 10*3/uL (ref 0.1–1.0)
Monocytes Relative: 8 % (ref 3–12)
Neutro Abs: 6.4 10*3/uL (ref 1.7–7.7)
Neutrophils Relative %: 75 % (ref 43–77)

## 2011-09-16 LAB — CBC
HCT: 46.3 % — ABNORMAL HIGH (ref 36.0–46.0)
Hemoglobin: 15.3 g/dL — ABNORMAL HIGH (ref 12.0–15.0)
MCH: 29.5 pg (ref 26.0–34.0)
MCHC: 33 g/dL (ref 30.0–36.0)
MCV: 89.4 fL (ref 78.0–100.0)
Platelets: 247 10*3/uL (ref 150–400)
RBC: 5.18 MIL/uL — ABNORMAL HIGH (ref 3.87–5.11)
RDW: 13.5 % (ref 11.5–15.5)
WBC: 8.5 10*3/uL (ref 4.0–10.5)

## 2011-09-16 LAB — URINALYSIS, ROUTINE W REFLEX MICROSCOPIC
Glucose, UA: NEGATIVE mg/dL
Ketones, ur: 15 mg/dL — AB
Protein, ur: NEGATIVE mg/dL
Urobilinogen, UA: 1 mg/dL (ref 0.0–1.0)

## 2011-09-16 LAB — POCT I-STAT, CHEM 8
BUN: 16 mg/dL (ref 6–23)
Calcium, Ion: 1.18 mmol/L (ref 1.12–1.32)
Chloride: 110 mEq/L (ref 96–112)
Glucose, Bld: 146 mg/dL — ABNORMAL HIGH (ref 70–99)
HCT: 47 % — ABNORMAL HIGH (ref 36.0–46.0)
TCO2: 24 mmol/L (ref 0–100)

## 2011-09-16 LAB — CARDIAC PANEL(CRET KIN+CKTOT+MB+TROPI)
Relative Index: 4.2 — ABNORMAL HIGH (ref 0.0–2.5)
Troponin I: <0.3 ng/mL (ref <0.30)

## 2011-09-16 LAB — TROPONIN I: Troponin I: <0.3 ng/mL (ref <0.30)

## 2011-09-16 LAB — URINE MICROSCOPIC-ADD ON

## 2011-09-16 MED ORDER — ASPIRIN EC 81 MG PO TBEC
81.0000 mg | DELAYED_RELEASE_TABLET | Freq: Every day | ORAL | Status: DC
  Administered 2011-09-16 – 2011-09-17 (×2): 81 mg via ORAL
  Filled 2011-09-16 (×2): qty 1

## 2011-09-16 MED ORDER — SODIUM CHLORIDE 0.9 % IV SOLN: INTRAVENOUS | Status: DC

## 2011-09-16 MED ORDER — SODIUM CHLORIDE 0.9 % IV SOLN
INTRAVENOUS | Status: DC
  Administered 2011-09-16 – 2011-09-17 (×3): via INTRAVENOUS

## 2011-09-16 NOTE — ED Notes (Signed)
Family at bedside. Son and daughter in law.

## 2011-09-16 NOTE — ED Notes (Signed)
Pt. Stated, last night my heart was beating so fast and this morning I was so weak and my arm was hurting

## 2011-09-16 NOTE — ED Notes (Signed)
MD at bedside. 

## 2011-09-16 NOTE — H&P (Signed)
PCP:  Rogelia Boga, MD, MD   DOA:  09/16/2011  9:18 AM  Chief Complaint:  Chest pain and palpitations x 1 day  HPI: 78 y/o female with hx of HL and osteoporosis presented with 1 day of palpitations and left arm pain since 1 day.  Patient was in her usual health when she starting having palpitations with her heart racing  Very fast and symptom lasted for also 2 hours. She went off to bed. This morning she woke and up and felt very diaphoretic and hot. She also had dull pain voer left arm radiating to left side of neck 5/10 in intensity lasting for 15 minutes and self subsisded. She then cam eot ED. In the ED she was given ASA and EKG done and labs sent inclufding cardiac enzyme which was negative.  [facility] hospitalist called for admission to r/o ACS  on my evalaution she is symptom free. infomrs the last time she had similar symptoms was about 6 months ago. She had similar left arm pain radiating to left neck about 2 years ago for which she had stress test done  and was negative. denies SOB, orthopnea or PND. denies fever, cough, N/V, abdominal pain, bowel or urinary symptoms. She informs having a flu like symptoms few weeks back. She informs her living with her granddaughter and having some stressors with her.     Allergies: No Known Allergies  Prior to Admission medications   Medication Sig Start Date End Date Taking? Authorizing Provider  acetaminophen (TYLENOL) 500 MG tablet Take 500 mg by mouth every 6 (six) hours as needed. For pain.   Yes Historical Provider, MD  atorvastatin (LIPITOR) 40 MG tablet Take 20 mg by mouth daily. 10/18/10  Yes Gordy Savers, MD  Calcium Carbonate-Vitamin D (CALTRATE 600+D PO) Take 1 tablet by mouth daily.     Yes Historical Provider, MD  Polyvinyl Alcohol-Povidone (REFRESH OP) Place 1 drop into both eyes 2 (two) times daily.   Yes Historical Provider, MD    Past Medical History  Diagnosis Date  . DIVERTICULOSIS, COLON 11/22/2006  .  HYPERLIPIDEMIA 11/22/2006  . OSTEOPOROSIS 11/22/2006  . Menopausal syndrome (hot flashes)   . Headache   . Cancer Meloanoma left leg    Past Surgical History  Procedure Date  . Appendectomy   . Cholecystectomy   . Bunionectomy     Social History:  reports that she has never smoked. She has never used smokeless tobacco. She reports that she does not drink alcohol or use illicit drugs.  Family History  Problem Relation Age of Onset  . Alzheimer's disease Sister   . Cardiomyopathy Brother     Review of Systems:  Constitutional: Denies fever, chills, positive for  diaphoresis, appetite change and fatigue. left arm pain radiating to the neck  HEENT: Denies photophobia, eye pain, redness, hearing loss, ear pain, congestion, sore throat, rhinorrhea, sneezing, mouth sores, trouble swallowing, neck pain, neck stiffness and tinnitus.   Respiratory: Denies SOB, DOE, cough, chest tightness,  and wheezing.   Cardiovascular: Denies chest pain, palpitations + denies leg swelling.  Gastrointestinal: Denies nausea, vomiting, abdominal pain, diarrhea, constipation, blood in stool and abdominal distention.  Genitourinary: Denies dysuria, urgency, frequency, hematuria, flank pain and difficulty urinating.  Musculoskeletal: Denies myalgias, back pain, joint swelling, arthralgias and gait problem.  Skin: Denies pallor, rash and wound.  Neurological: Denies dizziness, seizures, syncope, weakness, light-headedness, numbness and headaches.  Hematological: Denies adenopathy. Easy bruising, personal or family bleeding history  Psychiatric/Behavioral: Denies  suicidal ideation, mood changes, confusion, nervousness, sleep disturbance and agitation   Physical Exam:  Filed Vitals:   09/16/11 1115 09/16/11 1145 09/16/11 1228 09/16/11 1317  BP: 115/52 121/51 158/57 158/57  Pulse: 57 65 55 55  Temp:   97.8 F (36.6 C) 97.8 F (36.6 C)  TempSrc:   Oral Oral  Resp: 17 20 20 20   Height:   5\' 5"  (1.651 m) 5\' 5"   (1.651 m)  Weight:   66.996 kg (147 lb 11.2 oz) 66.9 kg (147 lb 7.8 oz)  SpO2: 100% 99% 100% 100%    Constitutional: Vital signs reviewed.  Patient is a well-developed and well-nourished in no acute distress and cooperative with exam. Alert and oriented x3.  Head: Normocephalic and atraumatic Ear: TM normal bilaterally Mouth: no erythema or exudates, MMM Eyes: PERRL, EOMI, conjunctivae normal, No scleral icterus.  Neck: Supple, Trachea midline normal ROM, No JVD, mass, thyromegaly, or carotid bruit present.  Cardiovascular: RRR, S1 normal, S2 normal, no MRG, pulses symmetric and intact bilaterally Pulmonary/Chest: CTAB, no wheezes, rales, or rhonchi Abdominal: Soft. Non-tender, non-distended, bowel sounds are normal, no masses, organomegaly, or guarding present.  GU: no CVA tenderness Musculoskeletal: No joint deformities, erythema, or stiffness, ROM full and no nontender Ext: no edema and no cyanosis, pulses palpable bilaterally (DP and PT) Hematology: no cervical, inginal, or axillary adenopathy.  Neurological: A&O x3, Strenght is normal and symmetric bilaterally, cranial nerve II-XII are grossly intact, no focal motor deficit, sensory intact to light touch bilaterally.  Skin: Warm, dry and intact. No rash, cyanosis, or clubbing.  Psychiatric: Normal mood and affect. speech and behavior is normal. Judgment and thought content normal. Cognition and memory are normal.   Labs on Admission:  Results for orders placed during the hospital encounter of 09/16/11 (from the past 48 hour(s))  CBC     Status: Abnormal   Collection Time   09/16/11  9:37 AM      Component Value Range Comment   WBC 8.5  4.0 - 10.5 (K/uL)    RBC 5.18 (*) 3.87 - 5.11 (MIL/uL)    Hemoglobin 15.3 (*) 12.0 - 15.0 (g/dL)    HCT 62.9 (*) 52.8 - 46.0 (%)    MCV 89.4  78.0 - 100.0 (fL)    MCH 29.5  26.0 - 34.0 (pg)    MCHC 33.0  30.0 - 36.0 (g/dL)    RDW 41.3  24.4 - 01.0 (%)    Platelets 247  150 - 400 (K/uL)     DIFFERENTIAL     Status: Normal   Collection Time   09/16/11  9:37 AM      Component Value Range Comment   Neutrophils Relative 75  43 - 77 (%)    Neutro Abs 6.4  1.7 - 7.7 (K/uL)    Lymphocytes Relative 16  12 - 46 (%)    Lymphs Abs 1.4  0.7 - 4.0 (K/uL)    Monocytes Relative 8  3 - 12 (%)    Monocytes Absolute 0.7  0.1 - 1.0 (K/uL)    Eosinophils Relative 1  0 - 5 (%)    Eosinophils Absolute 0.1  0.0 - 0.7 (K/uL)    Basophils Relative 1  0 - 1 (%)    Basophils Absolute 0.0  0.0 - 0.1 (K/uL)   TROPONIN I     Status: Normal   Collection Time   09/16/11  9:37 AM      Component Value Range Comment   Troponin I <  0.30  <0.30 (ng/mL)   POCT I-STAT, CHEM 8     Status: Abnormal   Collection Time   09/16/11  9:55 AM      Component Value Range Comment   Sodium 145  135 - 145 (mEq/L)    Potassium 4.0  3.5 - 5.1 (mEq/L)    Chloride 110  96 - 112 (mEq/L)    BUN 16  6 - 23 (mg/dL)    Creatinine, Ser 2.95 (*) 0.50 - 1.10 (mg/dL)    Glucose, Bld 621 (*) 70 - 99 (mg/dL)    Calcium, Ion 3.08  1.12 - 1.32 (mmol/L)    TCO2 24  0 - 100 (mmol/L)    Hemoglobin 16.0 (*) 12.0 - 15.0 (g/dL)    HCT 65.7 (*) 84.6 - 46.0 (%)   URINALYSIS, ROUTINE W REFLEX MICROSCOPIC     Status: Abnormal   Collection Time   09/16/11 10:22 AM      Component Value Range Comment   Color, Urine AMBER (*) YELLOW  BIOCHEMICALS MAY BE AFFECTED BY COLOR   APPearance CLOUDY (*) CLEAR     Specific Gravity, Urine 1.026  1.005 - 1.030     pH 5.0  5.0 - 8.0     Glucose, UA NEGATIVE  NEGATIVE (mg/dL)    Hgb urine dipstick NEGATIVE  NEGATIVE     Bilirubin Urine SMALL (*) NEGATIVE     Ketones, ur 15 (*) NEGATIVE (mg/dL)    Protein, ur NEGATIVE  NEGATIVE (mg/dL)    Urobilinogen, UA 1.0  0.0 - 1.0 (mg/dL)    Nitrite NEGATIVE  NEGATIVE     Leukocytes, UA MODERATE (*) NEGATIVE    URINE MICROSCOPIC-ADD ON     Status: Abnormal   Collection Time   09/16/11 10:22 AM      Component Value Range Comment   Squamous Epithelial / LPF RARE   RARE     WBC, UA 3-6  <3 (WBC/hpf)    RBC / HPF 0-2  <3 (RBC/hpf)    Bacteria, UA FEW (*) RARE     Casts HYALINE CASTS (*) NEGATIVE     Urine-Other MUCOUS PRESENT       Radiological Exams on Admission: CXR - left lower lung nodular density. Recommended for repeat follow up as outpt ( possibly with a CT scan)  Assessment/Plan   *Chest pain Appears to be atypical and more likely stress induced  monitor under observation in tele Currently pain free  serial CE and EKG to r/o ACS ASA 81 mg po daily patient currently pain free If ruled out for ACS can be discharged home in am and follow up with PCP as outpt with 2D echo  patient had stress test done 2 years back for similar symptoms and was negative   Palpitations Monitor in tele  check TSH  EKG wnl Plan on 2D echo and can be done as outpt if stable     Dehydration Patient appeared quite dehydrated as per ED and started IV fluids Mild AKI noted Will cont IV NS for now Patient informs having a flu like illness recently   hyperlipidemia Cont lipitor   DVT prophylaxis: early ambulation  Cardiac diet  Full code  Time Spent on Admission: 60 minutes  Kaylana Fenstermacher 09/16/2011, 1:57 PM

## 2011-09-16 NOTE — ED Provider Notes (Signed)
History     CSN: [id number]  Arrival date & time 09/16/11  4098   First MD Initiated Contact with Patient 09/16/11 [phone]      Chief Complaint  Patient presents with  . Irregular Heart Beat    (Consider location/radiation/quality/duration/timing/severity/associated sxs/prior treatment) The history is provided by the patient.  patient states that she felt her heart very fast last night. She states she had some left-sided chest pain that went to her neck with it. She states she felt very weak during the episode and after. No cough. No fevers. She states she's otherwise feeling well. She states she's had an episode like this in the past. She states she had a previous negative stress test over a year ago. She still feels somewhat weak, the chest pain is resolved. She states she was too weak to get out of bed. She states she also to urinate 3 times in 15 minutes. He states this is unusual for her.  Past Medical History  Diagnosis Date  . DIVERTICULOSIS, COLON 11/22/2006  . HYPERLIPIDEMIA 11/22/2006  . OSTEOPOROSIS 11/22/2006  . Menopausal syndrome (hot flashes)     Past Surgical History  Procedure Date  . Appendectomy   . Cholecystectomy   . Shoulder surgery     fx  . Bunionectomy     No family history on file.  History  Substance Use Topics  . Smoking status: Never Smoker   . Smokeless tobacco: Never Used  . Alcohol Use: No    OB History    Grav Para Term Preterm Abortions TAB SAB Ect Mult Living                  Review of Systems  Constitutional: Positive for fatigue. Negative for activity change and appetite change.  HENT: Negative for neck stiffness.   Eyes: Negative for pain.  Respiratory: Negative for chest tightness and shortness of breath.   Cardiovascular: Positive for chest pain and palpitations. Negative for leg swelling.  Gastrointestinal: Negative for nausea, vomiting, abdominal pain and diarrhea.  Genitourinary: Positive for frequency. Negative for flank  pain.  Musculoskeletal: Negative for back pain.  Skin: Negative for rash.  Neurological: Positive for weakness. Negative for numbness and headaches.  Psychiatric/Behavioral: Negative for behavioral problems.    Allergies  Review of patient's allergies indicates no known allergies.  Home Medications   Current Outpatient Rx  Name Route Sig Dispense Refill  . ACETAMINOPHEN 500 MG PO TABS Oral Take 500 mg by mouth every 6 (six) hours as needed. For pain.    . ATORVASTATIN CALCIUM 40 MG PO TABS Oral Take 20 mg by mouth daily.    [name] CALTRATE 600+D PO Oral Take 1 tablet by mouth daily.      [name] REFRESH OP Both Eyes Place 1 drop into both eyes 2 (two) times daily.      BP 115/52  Pulse 57  Temp(Src) 97.3 F (36.3 C) (Oral)  Resp 17  SpO2 100%  Physical Exam  Nursing note and vitals reviewed. Constitutional: She is oriented to person, place, and time. She appears well-developed and well-nourished.       Patient appears somewhat weak overall  HENT:  Head: Normocephalic and atraumatic.  Neck: Normal range of motion. Neck supple.  Cardiovascular: Normal rate, regular rhythm and normal heart sounds.   No murmur heard. Pulmonary/Chest: Effort normal and breath sounds normal. No respiratory distress. She has no wheezes. She has no rales.  Abdominal: Soft. Bowel sounds are normal. She exhibits  no distension. There is no tenderness. There is no rebound and no guarding.  Musculoskeletal: Normal range of motion.  Neurological: She is alert and oriented to person, place, and time. No cranial nerve deficit.  Skin: Skin is warm and dry.  Psychiatric: She has a normal mood and affect. Her speech is normal.    ED Course  Procedures (including critical care time)  Labs Reviewed  CBC - Abnormal; Notable for the following:    RBC 5.18 (*)    Hemoglobin 15.3 (*)    HCT 46.3 (*)    All other components within normal limits  URINALYSIS, ROUTINE W REFLEX MICROSCOPIC - Abnormal; Notable for the  following:    Color, Urine AMBER (*) BIOCHEMICALS MAY BE AFFECTED BY COLOR   APPearance CLOUDY (*)    Bilirubin Urine SMALL (*)    Ketones, ur 15 (*)    Leukocytes, UA MODERATE (*)    All other components within normal limits  POCT I-STAT, CHEM 8 - Abnormal; Notable for the following:    Creatinine, Ser 1.20 (*)    Glucose, Bld 146 (*)    Hemoglobin 16.0 (*)    HCT 47.0 (*)    All other components within normal limits  URINE MICROSCOPIC-ADD ON - Abnormal; Notable for the following:    Bacteria, UA FEW (*)    Casts HYALINE CASTS (*)    All other components within normal limits  DIFFERENTIAL  TROPONIN I   Dg Chest 2 View  09/16/2011  *RADIOLOGY REPORT*  Clinical Data: Left chest pain and shortness of breath.  CHEST - 2 VIEW  Comparison: None.  Findings: There is a vague area of potential density in the retrocardiac region of the left lower lung which is not well localized in the lateral projection.  Without prior chest x-rays available for comparison at this time, a subtle pulmonary nodule cannot be excluded.  I would initially recommend a follow-up PA and lateral chest x-ray.  If this is a persistent finding, chest CT evaluation may be necessary to exclude pulmonary nodule.  No evidence of active infiltrate, pulmonary edema or pleural fluid. Heart size is normal.  The bony thorax is unremarkable.  IMPRESSION: Potential subtle retrocardiac left lower lung nodular density.  As above, if this is persistent on follow-up PA and lateral chest x- ray, CT of the chest is recommended.  Original Report Authenticated By: Reola Calkins, M.D.     1. Chest pain   2. Dehydration      Date: 09/16/2011  Rate: 65  Rhythm: normal sinus rhythm and sinus arrhythmia  QRS Axis: normal  Intervals: normal  ST/T Wave abnormalities: normal  Conduction Disutrbances:nonspecific intraventricular conduction delay  Narrative Interpretation:   Old EKG Reviewed: unchanged    MDM  Patient with palpitations  and left-sided chest pain last night. EKG and lab work are reassuring. It does show a possible dehydration. Patient be admitted to medicine for further evaluation.        Juliet Rude. Rubin Payor, MD 09/16/11 1128

## 2011-09-17 DIAGNOSIS — R0789 Other chest pain: Secondary | ICD-10-CM | POA: Diagnosis present

## 2011-09-17 LAB — CARDIAC PANEL(CRET KIN+CKTOT+MB+TROPI)
CK, MB: 3.2 ng/mL (ref 0.3–4.0)
Relative Index: INVALID (ref 0.0–2.5)
Total CK: 77 U/L (ref 7–177)
Troponin I: <0.3 ng/mL (ref <0.30)
Troponin I: <0.3 ng/mL (ref <0.30)

## 2011-09-17 NOTE — Discharge Instructions (Signed)
Chest Pain (Nonspecific) It is often hard to give a specific diagnosis for the cause of chest pain. There is always a chance that your pain could be related to something serious, such as a heart attack or a blood clot in the lungs. You need to follow up with your caregiver for further evaluation. CAUSES   Heartburn.   Pneumonia or bronchitis.   Anxiety or stress.   Inflammation around your heart (pericarditis) or lung (pleuritis or pleurisy).   A blood clot in the lung.   A collapsed lung (pneumothorax). It can develop suddenly on its own (spontaneous pneumothorax) or from injury (trauma) to the chest.   Shingles infection (herpes zoster virus).  The chest wall is composed of bones, muscles, and cartilage. Any of these can be the source of the pain.  The bones can be bruised by injury.   The muscles or cartilage can be strained by coughing or overwork.   The cartilage can be affected by inflammation and become sore (costochondritis).  DIAGNOSIS  Lab tests or other studies, such as X-rays, electrocardiography, stress testing, or cardiac imaging, may be needed to find the cause of your pain.  TREATMENT   Treatment depends on what may be causing your chest pain. Treatment may include:   Acid blockers for heartburn.   Anti-inflammatory medicine.   Pain medicine for inflammatory conditions.   Antibiotics if an infection is present.   You may be advised to change lifestyle habits. This includes stopping smoking and avoiding alcohol, caffeine, and chocolate.   You may be advised to keep your head raised (elevated) when sleeping. This reduces the chance of acid going backward from your stomach into your esophagus.   Most of the time, nonspecific chest pain will improve within 2 to 3 days with rest and mild pain medicine.  HOME CARE INSTRUCTIONS   If antibiotics were prescribed, take your antibiotics as directed. Finish them even if you start to feel better.   For the next few  days, avoid physical activities that bring on chest pain. Continue physical activities as directed.   Do not smoke.   Avoid drinking alcohol.   Only take over-the-counter or prescription medicine for pain, discomfort, or fever as directed by your caregiver.   Follow your caregiver's suggestions for further testing if your chest pain does not go away.   Keep any follow-up appointments you made. If you do not go to an appointment, you could develop lasting (chronic) problems with pain. If there is any problem keeping an appointment, you must call to reschedule.  SEEK MEDICAL CARE IF:   You think you are having problems from the medicine you are taking. Read your medicine instructions carefully.   Your chest pain does not go away, even after treatment.   You develop a rash with blisters on your chest.  SEEK IMMEDIATE MEDICAL CARE IF:   You have increased chest pain or pain that spreads to your arm, neck, jaw, back, or abdomen.   You develop shortness of breath, an increasing cough, or you are coughing up blood.   You have severe back or abdominal pain, feel nauseous, or vomit.   You develop severe weakness, fainting, or chills.   You have a fever.  THIS IS AN EMERGENCY. Do not wait to see if the pain will go away. Get medical help at once. Call your local emergency services (911 in [place] Do not drive yourself to the hospital. MAKE SURE YOU:   Understand these instructions.     Will watch your condition.   Will get help right away if you are not doing well or get worse.  Document Released: 02/15/2005 Document Revised: 04/27/2011 Document Reviewed: 12/12/2007 [employer] Patient Information 2012 [employer]. 

## 2011-09-17 NOTE — Progress Notes (Signed)
Verbalized understanding of discharge instructions.  Vital signs stable.  Transportation via family member to home.

## 2011-09-17 NOTE — Discharge Summary (Signed)
Patient ID: [name] Small MRN: [id number] DOB/AGE: September 02, 1928 78 y.o.  Admit date: 09/16/2011 Discharge date: 09/17/2011  Primary Care Physician:  Rogelia Boga, MD, MD  Discharge Diagnoses:     Principal Problem:  *Chest pain, atypical  Active Problems:  Dehydration  chest Palpitations Hyperlipidemia osteoporosis    Medication List  As of 09/17/2011  9:17 AM   TAKE these medications         acetaminophen 500 MG tablet   Commonly known as: TYLENOL   Take 500 mg by mouth every 6 (six) hours as needed. For pain.      atorvastatin 40 MG tablet   Commonly known as: LIPITOR   Take 20 mg by mouth daily.      CALTRATE 600+D PO   Take 1 tablet by mouth daily.      REFRESH OP   Place 1 drop into both eyes 2 (two) times daily.            Disposition and Follow-up:  Home with outpatient PCP follow up  Consults:  none  Significant Diagnostic Studies:  No results found.  Brief H and P: For complete details please refer to admission H and P, but in brief 78 y/o female with hx of HL and osteoporosis presented with 1 day of palpitations and left arm pain since 1 day.  Patient was in her usual health when she starting having palpitations with her heart racing Very fast and symptom lasted for also 2 hours. She went off to bed. This morning she woke and up and felt very diaphoretic and hot. She also had dull pain voer left arm radiating to left side of neck 5/10 in intensity lasting for 15 minutes and self subsisded. She then cam eot ED. In the ED she was given ASA and EKG done and labs sent inclufding cardiac enzyme which was negative.  [facility] hospitalist called for admission to r/o ACS  on my evalaution she is symptom free. infomrs the last time she had similar symptoms was about 6 months ago. She had similar left arm pain radiating to left neck about 2 years ago for which she had stress test done and was negative. denies SOB, orthopnea or PND. denies fever, cough, N/V,  abdominal pain, bowel or urinary symptoms.  She informs having a flu like symptoms few weeks back.   Physical Exam on Discharge:  Filed Vitals:   09/16/11 1317 09/16/11 2138 09/17/11 0159 09/17/11 0300  BP: 158/57 137/71 136/66 169/87  Pulse: 55 53 50 54  Temp: 97.8 F (36.6 C) 98 F (36.7 C) 97.7 F (36.5 C) 97.8 F (36.6 C)  TempSrc: Oral Oral Oral Oral  Resp: 20 18 18 18   Height: 5\' 5"  (1.651 m)     Weight: 66.9 kg (147 lb 7.8 oz)   68.5 kg (151 lb 0.2 oz)  SpO2: 100% 98% 98% 100%     Intake/Output Summary (Last 24 hours) at 09/17/11 0917 Last data filed at 09/17/11 0851  Gross per 24 hour  Intake 3618.75 ml  Output    475 ml  Net 3143.75 ml    General: Alert, awake, oriented x3, in no acute distress. HEENT: No bruits, no goiter. Heart: Regular rate and rhythm, without murmurs, rubs, gallops. Lungs: Clear to auscultation bilaterally. Abdomen: Soft, nontender, nondistended, positive bowel sounds. Extremities: No clubbing cyanosis or edema with positive pedal pulses. Neuro: Grossly intact, nonfocal.  CBC:    Component Value Date/Time   WBC 8.5 09/16/2011 0937  HGB 16.0* 09/16/2011 0955   HCT 47.0* 09/16/2011 0955   PLT 247 09/16/2011 0937   MCV 89.4 09/16/2011 0937   NEUTROABS 6.4 09/16/2011 0937   LYMPHSABS 1.4 09/16/2011 0937   MONOABS 0.7 09/16/2011 0937   EOSABS 0.1 09/16/2011 0937   BASOSABS 0.0 09/16/2011 0937    Basic Metabolic Panel:    Component Value Date/Time   NA 145 09/16/2011 0955   K 4.0 09/16/2011 0955   CL 110 09/16/2011 0955   CO2 29 10/18/2010 0952   BUN 16 09/16/2011 0955   CREATININE 1.20* 09/16/2011 0955   GLUCOSE 146* 09/16/2011 0955   CALCIUM 9.5 10/18/2010 0952    Hospital Course:  *Chest pain  Appears to be atypical and more likely stress induced  Stable  under observation in tele  Currently pain free serial CE and EKG done to r/o ACS and negative patient is symptom free can be discharged home  and follow up with PCP as outpt with 2D  echo  patient had stress test done 2 years back for similar symptoms and was negative   Palpitations  Stable on tele, few episodes of HR in high 40s to 50s but asymptomatic check TSH wnl EKG wnl  Patient would benefit from a  2D echo to evaluate the heart valves and can be done as outpt  Dehydration  Patient appeared quite dehydrated as per ED and started IV fluids . Patient informs having a flu like illness recently  Mild AKI noted  Stable this am  hyperlipidemia  Cont lipitor   Patient clinically stable and can be discharged home with outpt PCP follow up. Recommend 2d ECHO as outpt   Time spent on Discharge: 25 minutes  Signed: Eddie North 09/17/2011, 9:17 AM

## 2011-09-20 ENCOUNTER — Encounter: Payer: Self-pay | Admitting: Internal Medicine

## 2011-09-21 ENCOUNTER — Encounter: Payer: Self-pay | Admitting: Internal Medicine

## 2011-09-21 ENCOUNTER — Ambulatory Visit (INDEPENDENT_AMBULATORY_CARE_PROVIDER_SITE_OTHER): Payer: Medicare Other | Admitting: Internal Medicine

## 2011-09-21 VITALS — BP 150/90 | Temp 97.2°F | Wt 152.0 lb

## 2011-09-21 DIAGNOSIS — R002 Palpitations: Secondary | ICD-10-CM

## 2011-09-21 DIAGNOSIS — R0789 Other chest pain: Secondary | ICD-10-CM

## 2011-09-21 DIAGNOSIS — E785 Hyperlipidemia, unspecified: Secondary | ICD-10-CM

## 2011-09-21 MED ORDER — ASPIRIN 81 MG PO TBEC: 81.0000 mg | DELAYED_RELEASE_TABLET | Freq: Every day | ORAL | Status: AC

## 2011-09-21 NOTE — Patient Instructions (Signed)
Limit your sodium (Salt) intake    It is important that you exercise regularly, at least 20 minutes 3 to 4 times per week.  If you develop chest pain or shortness of breath seek  medical attention. 

## 2011-09-21 NOTE — Progress Notes (Signed)
  Subjective:    Patient ID: [name] Small, female    DOB: [birth year], 78 y.o.   MRN: [id number]  HPI  78 year old patient who is seen today post hospital discharge. She was admitted briefly for evaluation of atypical chest pain associated with palpitations. She was discharged about 4 days ago and has been stable. There's been no recurrent symptoms. She did have a stress test performed about 2 years ago for similar pain. EKGs and serial cardiac enzymes were normal. An outpatient 2-D echocardiogram was recommended. Discharge summary reviewed    Review of Systems  Constitutional: Negative.   HENT: Negative for hearing loss, congestion, sore throat, rhinorrhea, dental problem, sinus pressure and tinnitus.   Eyes: Negative for pain, discharge and visual disturbance.  Respiratory: Negative for cough and shortness of breath.   Cardiovascular: Positive for chest pain and palpitations. Negative for leg swelling.  Gastrointestinal: Negative for nausea, vomiting, abdominal pain, diarrhea, constipation, blood in stool and abdominal distention.  Genitourinary: Negative for dysuria, urgency, frequency, hematuria, flank pain, vaginal bleeding, vaginal discharge, difficulty urinating, vaginal pain and pelvic pain.  Musculoskeletal: Negative for joint swelling, arthralgias and gait problem.  Skin: Negative for rash.  Neurological: Negative for dizziness, syncope, speech difficulty, weakness, numbness and headaches.  Hematological: Negative for adenopathy.  Psychiatric/Behavioral: Negative for behavioral problems, dysphoric mood and agitation. The patient is not nervous/anxious.        Objective:   Physical Exam  Constitutional: She is oriented to person, place, and time. She appears well-developed and well-nourished.  HENT:  Head: Normocephalic.  Right Ear: External ear normal.  Left Ear: External ear normal.  Mouth/Throat: Oropharynx is clear and moist.  Eyes: Conjunctivae and EOM are normal. Pupils  are equal, round, and reactive to light.  Neck: Normal range of motion. Neck supple. No thyromegaly present.  Cardiovascular: Normal rate, regular rhythm, normal heart sounds and intact distal pulses.   No murmur heard. Pulmonary/Chest: Effort normal and breath sounds normal.  Abdominal: Soft. Bowel sounds are normal. She exhibits no mass. There is no tenderness.  Musculoskeletal: Normal range of motion.  Lymphadenopathy:    She has no cervical adenopathy.  Neurological: She is alert and oriented to person, place, and time.  Skin: Skin is warm and dry. No rash noted.  Psychiatric: She has a normal mood and affect. Her behavior is normal.          Assessment & Plan:  Atypical chest pain. Clinically stable at present History palpitations stable Dyslipidemia  We'll set her 2-D echocardiogram. Baby aspirin added to her regimen. If she remains stable will see in 6 months for her annual exam. She report any new symptoms

## 2011-09-22 ENCOUNTER — Encounter: Payer: Self-pay | Admitting: Internal Medicine

## 2011-10-03 ENCOUNTER — Other Ambulatory Visit: Payer: Self-pay

## 2011-10-03 ENCOUNTER — Ambulatory Visit (HOSPITAL_COMMUNITY): Payer: Medicare Other | Attending: Cardiology

## 2011-10-03 DIAGNOSIS — R079 Chest pain, unspecified: Secondary | ICD-10-CM | POA: Insufficient documentation

## 2011-10-03 DIAGNOSIS — R0789 Other chest pain: Secondary | ICD-10-CM

## 2011-10-03 DIAGNOSIS — R002 Palpitations: Secondary | ICD-10-CM | POA: Insufficient documentation

## 2011-10-03 DIAGNOSIS — R072 Precordial pain: Secondary | ICD-10-CM

## 2011-10-04 NOTE — Progress Notes (Signed)
Quick Note:  Attempt to call- VM - LMTCB if questions - test normal ______ 

## 2012-02-23 ENCOUNTER — Telehealth: Payer: Self-pay | Admitting: Internal Medicine

## 2012-02-23 MED ORDER — CIPROFLOXACIN HCL 500 MG PO TABS: 500.0000 mg | ORAL_TABLET | Freq: Two times a day (BID) | ORAL | Status: DC

## 2012-02-23 NOTE — Telephone Encounter (Signed)
Please advise -last seen 09/2011 post hospital

## 2012-02-23 NOTE — Telephone Encounter (Signed)
Caller: Haruna/Patient; Patient Name: Donnetta Hail; PCP: Eleonore Chiquito [facility]); Best Callback Phone Number: [phone]; Reason for call: Urinary Pain. Menopause.   Onset a "couple hours ago" 02/23/12.  +burning, +frequency, +hesistancy, no back pain, no fever.  She took x2 over the counter AZO. She is requesting Prescription medication called into her pharmacy. She reports that Dr. Kirtland Bouchard. has called in medication for her before.   She is requesting a prescription for Ciprofloxin. This was given  the last time she had a UTI in January , 2013. Emergent s/sx ruled out per Urinary Symptoms Protocol with excpetion to "Has one or more urinary tract symptoms and has not been previously evaluated".  See Provider in 24 hours. Patient declined appointment at present, would like to see if PRESCRIPTION MEDICATION WOULD BE CALLED. Home care instrucitons reviewed. Understadning expressed. Encouraged to call back for questions, changes or concerns.

## 2012-02-23 NOTE — Telephone Encounter (Signed)
Ok cipro 500  #6  One bid

## 2012-03-14 ENCOUNTER — Ambulatory Visit (INDEPENDENT_AMBULATORY_CARE_PROVIDER_SITE_OTHER): Payer: Medicare Other

## 2012-03-14 DIAGNOSIS — Z23 Encounter for immunization: Secondary | ICD-10-CM

## 2012-04-02 ENCOUNTER — Encounter: Payer: Self-pay | Admitting: Internal Medicine

## 2012-04-02 ENCOUNTER — Ambulatory Visit (INDEPENDENT_AMBULATORY_CARE_PROVIDER_SITE_OTHER): Payer: Medicare Other | Admitting: Internal Medicine

## 2012-04-02 VITALS — BP 160/90 | HR 63 | Temp 98.1°F | Resp 18 | Wt 149.0 lb

## 2012-04-02 DIAGNOSIS — E785 Hyperlipidemia, unspecified: Secondary | ICD-10-CM

## 2012-04-02 DIAGNOSIS — M810 Age-related osteoporosis without current pathological fracture: Secondary | ICD-10-CM

## 2012-04-02 DIAGNOSIS — I10 Essential (primary) hypertension: Secondary | ICD-10-CM | POA: Insufficient documentation

## 2012-04-02 DIAGNOSIS — Z0000 Encounter for general adult medical examination without abnormal findings: Secondary | ICD-10-CM

## 2012-04-02 DIAGNOSIS — R002 Palpitations: Secondary | ICD-10-CM

## 2012-04-02 DIAGNOSIS — N3281 Overactive bladder: Secondary | ICD-10-CM | POA: Insufficient documentation

## 2012-04-02 DIAGNOSIS — N318 Other neuromuscular dysfunction of bladder: Secondary | ICD-10-CM

## 2012-04-02 DIAGNOSIS — N3941 Urge incontinence: Secondary | ICD-10-CM

## 2012-04-02 LAB — POCT URINALYSIS DIPSTICK
Bilirubin, UA: NEGATIVE
Glucose, UA: NEGATIVE
Ketones, UA: NEGATIVE
Leukocytes, UA: NEGATIVE
Spec Grav, UA: 1.025

## 2012-04-02 LAB — COMPREHENSIVE METABOLIC PANEL
Albumin: 4 g/dL (ref 3.5–5.2)
BUN: 14 mg/dL (ref 6–23)
CO2: 28 mEq/L (ref 19–32)
GFR: 57.87 mL/min — ABNORMAL LOW (ref >60.00)
Glucose, Bld: 95 mg/dL (ref 70–99)
Sodium: 140 mEq/L (ref 135–145)
Total Bilirubin: 0.7 mg/dL (ref 0.3–1.2)
Total Protein: 7.3 g/dL (ref 6.0–8.3)

## 2012-04-02 LAB — CBC WITH DIFFERENTIAL/PLATELET
Basophils Relative: 0.8 % (ref 0.0–3.0)
Eosinophils Relative: 1.4 % (ref 0.0–5.0)
HCT: 41.4 % (ref 36.0–46.0)
Monocytes Relative: 6.9 % (ref 3.0–12.0)
Neutrophils Relative %: 53.9 % (ref 43.0–77.0)
Platelets: 214 10*3/uL (ref 150.0–400.0)
RBC: 4.57 Mil/uL (ref 3.87–5.11)
WBC: 6.3 10*3/uL (ref 4.5–10.5)

## 2012-04-02 LAB — LIPID PANEL
Cholesterol: 192 mg/dL (ref 0–200)
HDL: 50 mg/dL (ref >39.00)

## 2012-04-02 LAB — TSH: TSH: 1.64 u[IU]/mL (ref 0.35–5.50)

## 2012-04-02 MED ORDER — LISINOPRIL 20 MG PO TABS: 20.0000 mg | ORAL_TABLET | Freq: Every day | ORAL | Status: DC

## 2012-04-02 MED ORDER — FESOTERODINE FUMARATE ER 8 MG PO TB24: 8.0000 mg | ORAL_TABLET | Freq: Every day | ORAL | Status: DC

## 2012-04-02 MED ORDER — ATORVASTATIN CALCIUM 40 MG PO TABS: 40.0000 mg | ORAL_TABLET | Freq: Every day | ORAL | Status: DC

## 2012-04-02 NOTE — Progress Notes (Signed)
Subjective:    Patient ID: [name] Small, female    DOB: [birth year], 78 y.o.   MRN: [id number]  HPI Subjective:    Patient ID: [name] Small, female    DOB: 1928/10/04, 78 y.o.   MRN: [id number]  HPI  78year-old patient who is seen today for a health maintenance examination. Medical problems include dyslipidemia. She also has a history of osteoporosis but self discontinued Boniva approximately 2 years ago;  no concerns or complaints. Complaints today include urinary frequency and urgency to the point of incontinence for the past 6 months. Blood pressure has also been slightly elevated  1. Risk factors, based on past  M,S,F history-  cardiovascular risk factors include dyslipidemia  2.  Physical activities: Remains quite active still works part-time at [employer]  3.  Depression/mood: No history of depression or mood disorder  4.  Hearing: No deficits  5.  ADL's: Independent in all aspects of daily living  6.  Fall risk: Low 7.  Home safety: No problems identified 8.  Height weight, and visual acuity; height and weight stable no change in visual acuity has had bilateral cataract extraction surgery  9.  Counseling: Heart healthy diet calcium vitamin D regular exercise are encouraged  10. Lab orders based on risk factors: Temperature profile including lipid panel will be reviewed 11. Referral: Not appropriate at this time  12. Care plan: Continue calcium and vitamin D supplement she has had a mammogram this year. Heart healthy diet and regular exercise all encouraged  13. Cognitive assessment: Alert and oriented with normal affect. No cognitive dysfunction       Review of Systems  Constitutional: Negative for fever, appetite change, fatigue and unexpected weight change.  HENT: Negative for hearing loss, ear pain, nosebleeds, congestion, sore throat, mouth sores, trouble swallowing, neck stiffness, dental problem, voice change, sinus pressure and tinnitus.   Eyes: Negative for  photophobia, pain, redness and visual disturbance.  Respiratory: Negative for cough, chest tightness and shortness of breath.   Cardiovascular: Negative for chest pain, palpitations and leg swelling.  Gastrointestinal: Negative for nausea, vomiting, abdominal pain, diarrhea, constipation, blood in stool, abdominal distention and rectal pain.  Genitourinary: Negative for dysuria, urgency, frequency, hematuria, flank pain, vaginal bleeding, vaginal discharge, difficulty urinating, genital sores, vaginal pain, menstrual problem and pelvic pain.  Musculoskeletal: Negative for back pain and arthralgias.  Skin: Negative for rash.  Neurological: Negative for dizziness, syncope, speech difficulty, weakness, light-headedness, numbness and headaches.  Hematological: Negative for adenopathy. Does not bruise/bleed easily.  Psychiatric/Behavioral: Negative for suicidal ideas, behavioral problems, self-injury, dysphoric mood and agitation. The patient is not nervous/anxious.        Objective:   Physical Exam  Constitutional: She is oriented to person, place, and time. She appears well-developed and well-nourished.  HENT:  Head: Normocephalic and atraumatic.  Right Ear: External ear normal.  Left Ear: External ear normal.  Mouth/Throat: Oropharynx is clear and moist.  Eyes: Conjunctivae and EOM are normal.  Neck: Normal range of motion. Neck supple. No JVD present. No thyromegaly present.  Cardiovascular: Normal rate, regular rhythm, normal heart sounds and intact distal pulses.   No murmur heard.      Pedal pulses full  Pulmonary/Chest: Effort normal and breath sounds normal. She has no wheezes. She has no rales.  Abdominal: Soft. Bowel sounds are normal. She exhibits no distension and no mass. There is no tenderness. There is no rebound and no guarding.  Musculoskeletal: Normal range of motion. She exhibits  no edema and no tenderness.  Neurological: She is alert and oriented to person, place, and  time. She has normal reflexes. No cranial nerve deficit. She exhibits normal muscle tone. Coordination normal.  Skin: Skin is warm and dry. No rash noted.  Psychiatric: She has a normal mood and affect. Her behavior is normal.          Assessment & Plan:   Annual health assessment Dyslipidemia Osteoporosis. We'll check a bone density study in one year. We'll continue calcium and vitamin D supplements and regular exercise    BP Readings from Last 3 Encounters:  04/02/12 160/90  09/21/11 150/90  09/17/11 169/87    Review of Systems     Objective:   Physical Exam        Assessment & Plan:   Preventive health examination Mild hypertension. We'll start lisinopril 20 mg daily Overactive bladder. Samples of toviaz dispensed. Will recheck in 6 weeks Osteoporosis. We'll check a followup bone density

## 2012-04-02 NOTE — Patient Instructions (Signed)
Limit your sodium (Salt) intake    It is important that you exercise regularly, at least 20 minutes 3 to 4 times per week.  If you develop chest pain or shortness of breath seek  medical attention.  Bone density study as discussed  DASH Diet The DASH diet stands for "Dietary Approaches to Stop Hypertension." It is a healthy eating plan that has been shown to reduce high blood pressure (hypertension) in as little as 14 days, while also possibly providing other significant health benefits. These other health benefits include reducing the risk of breast cancer after menopause and reducing the risk of type 2 diabetes, heart disease, colon cancer, and stroke. Health benefits also include weight loss and slowing kidney failure in patients with chronic kidney disease.   DIET GUIDELINES  Limit salt (sodium). Your diet should contain less than 1500 mg of sodium daily.   Limit refined or processed carbohydrates. Your diet should include mostly whole grains. Desserts and added sugars should be used sparingly.   Include small amounts of heart-healthy fats. These types of fats include nuts, oils, and tub margarine. Limit saturated and trans fats. These fats have been shown to be harmful in the body.  CHOOSING FOODS   The following food groups are based on a 2000 calorie diet. See your Registered Dietitian for individual calorie needs. Grains and Grain Products (6 to 8 servings daily)  Eat More Often: Whole-wheat bread, brown rice, whole-grain or wheat pasta, quinoa, popcorn without added fat or salt (air popped).   Eat Less Often: White bread, white pasta, white rice, cornbread.  Vegetables (4 to 5 servings daily)  Eat More Often: Fresh, frozen, and canned vegetables. Vegetables may be raw, steamed, roasted, or grilled with a minimal amount of fat.   Eat Less Often/Avoid: Creamed or fried vegetables. Vegetables in a cheese sauce.  Fruit (4 to 5 servings daily)  Eat More Often: All fresh, canned (in  natural juice), or frozen fruits. Dried fruits without added sugar. One hundred percent fruit juice ( cup [237 mL] daily).   Eat Less Often: Dried fruits with added sugar. Canned fruit in light or heavy syrup.  [employer], Fish, and Poultry (2 servings or less daily. One serving is 3 to 4 oz [85-114 g]).  Eat More Often: Ninety percent or leaner ground beef, tenderloin, sirloin. Round cuts of beef, chicken breast, [place] breast. All fish. Grill, bake, or broil your meat. Nothing should be fried.   Eat Less Often/Avoid: Fatty cuts of meat, [place], or chicken leg, thigh, or wing. Fried cuts of meat or fish.  Dairy (2 to 3 servings)  Eat More Often: Low-fat or fat-free milk, low-fat plain or light yogurt, reduced-fat or part-skim cheese.   Eat Less Often/Avoid: Milk (whole, 2%). Whole milk yogurt. Full-fat cheeses.  Nuts, Seeds, and Legumes (4 to 5 servings per week)  Eat More Often: All without added salt.   Eat Less Often/Avoid: Salted nuts and seeds, canned beans with added salt.  Fats and Sweets (limited)  Eat More Often: Vegetable oils, tub margarines without trans fats, sugar-free gelatin. Mayonnaise and salad dressings.   Eat Less Often/Avoid: Coconut oils, palm oils, butter, stick margarine, cream, half and half, cookies, candy, pie.  FOR MORE INFORMATION The Dash Diet Eating Plan: www.dashdiet.org Document Released: 04/27/2011 Document Revised: 07/31/2011 Document Reviewed: 04/27/2011 [facility] Patient Information 2013 [place], [place].   Overactive Bladder, Adult The bladder has two functions that are totally opposite of the other. One is to relax and  stretch out so it can store urine (fills like a balloon), and the other is to contract and squeeze down so that it can empty the urine that it has stored. Proper functioning of the bladder is a complex mixing of these two functions. The filling and emptying of the bladder can be influenced by:  The bladder.   The spinal cord.    The brain.   The nerves going to the bladder.   Other organs that are closely related to the bladder such as prostate in males and the vagina in females.  As your bladder fills with urine, nerve signals are sent from the bladder to the brain to tell you that you may need to urinate. Normal urination requires that the bladder squeeze down with sufficient strength to empty the bladder, but this also requires that the bladder squeeze down sufficiently long to finish the job. In addition the sphincter muscles, which normally keep you from leaking urine, must also relax so that the urine can pass. Coordination between the bladder muscle squeezing down and the sphincter muscles relaxing is required to make everything happen normally. With an overactive bladder sometimes the muscles of the bladder contract unexpectedly and involuntarily and this causes an urgent need to urinate. The normal response is to try to hold urine in by contracting the sphincter muscles. Sometimes the bladder contracts so strongly that the sphincter muscles cannot stop the urine from passing out and incontinence occurs. This kind of incontinence is called urge incontinence. Having an overactive bladder can be embarrassing and awkward. It can keep you from living life the way you want to. Many people think it is just something you have to put up with as you grow older or have certain health conditions. In fact, there are treatments that can help make your life easier and more pleasant. CAUSES   Many things can cause an overactive bladder. Possibilities include:  Urinary tract infection or infection of nearby tissues such as the prostate.   Prostate enlargement.   In women, multiple pregnancies or surgery on the uterus or urethra.   Bladder stones, inflammation or tumors.   Caffeine.   Alcohol.   Medications. For example, diuretics (drugs that help the body get rid of extra fluid) increase urine production. Some other  medicines must be taken with lots of fluids.   Muscle or nerve weakness. This might be the result of a spinal cord injury, a stroke, multiple sclerosis or Parkinson's disease.   Diabetes can cause a high urine volume which fills the bladder so quickly that the normal urge to urinate is triggered very strongly.  SYMPTOMS    Loss of bladder control. You feel the need to urinate and cannot make your body wait.   Sudden, strong urges to urinate.   Urinating 8 or more times a day.   Waking up to urinate two or more times a night.  DIAGNOSIS   To decide if you have overactive bladder, your healthcare provider will probably:  Ask about symptoms you have noticed.   Ask about your overall health. This will include questions about any medications you are taking.   Do a physical examination. This will help determine if there are obvious blockages or other problems.   Order some tests. These might include:   A blood test to check for diabetes or other health issues that could be contributing to the problem.   Urine testing. This could measure the flow of urine and the pressure on the  bladder.   A test of your neurological system (the brain, spinal cord and nerves). This is the system that senses the need to urinate. Some of these tests are called flow tests, bladder pressure tests and electrical measurements of the sphincter muscle.   A bladder test to check whether it is emptying completely when you urinate.   Cytoscopy. This test uses a thin tube with a tiny camera on it. It offers a look inside your urethra and bladder to see if there are problems.   Imaging tests. You might be given a contrast dye and then asked to urinate. X-rays are taken to see how your bladder is working.  TREATMENT   An overactive bladder can be treated in many ways. The treatment will depend on the cause. Whether you have a mild or severe case also makes a difference. Often, treatment can be given in your  healthcare provider's office or clinic. Be sure to discuss the different options with your caregiver. They include:  Behavioral treatments. These do not involve medication or surgery:   Bladder training. For this, you would follow a schedule to urinate at regular intervals. This helps you learn to control the urge to urinate. At first, you might be asked to wait a few minutes after feeling the urge. In time, you should be able to schedule bathroom visits an hour or more apart.   Kegel exercises. These exercises strengthen the pelvic floor muscles, which support the bladder. By toning these muscles, they can help control urination, even if the bladder muscles are overactive. A specialist will teach you how to do these exercises correctly. They will require daily practice.   Weight loss. If you are obese or overweight, losing weight might stop your bladder from being overactive. Talk to your healthcare provider about how many pounds you should lose. Also ask if there is a specific program or method that would work best for you.   Diet change. This might be suggested if constipation is making your overactive bladder worse. Your healthcare provider or a nutritionist can explain ways to change what you eat to ease constipation. Other people might need to take in less caffeine or alcohol. Sometimes drinking fewer fluids is needed, too.   Protection. This is not an actual treatment. But, you could wear special pads to take care of any leakage while you wait for other treatments to take effect. This will help you avoid embarrassment.   Physical treatments.   Electrical stimulation. Electrodes will send gentle pulses to the nerves or muscles that help control the bladder. The goal is to strengthen them. Sometimes this is done with the electrodes outside of the body. Or, they might be placed inside the body (implanted). This treatment can take several months to have an effect.   Medications. These are  usually used along with other treatments. Several medicines are available. Some are injected into the muscles involved in urination. Others come in pill form. Medications sometimes prescribed include:   Anticholinergics. These drugs block the signals that the nerves deliver to the bladder. This keeps it from releasing urine at the wrong time. Researchers think the drugs might help in other ways, too.   Imipramine. This is an antidepressant. But, it relaxes bladder muscles.   Botox. This is still experimental. Some people believe that injecting it into the bladder muscles will relax them so they work more normally. It has also been injected into the sphincter muscle when the sphincter muscle does not open properly.  This is a temporary fix, however. Also, it might make matters worse, especially in older people.   Surgery.   A device might be implanted to help manage your nerves. It works on the nerves that signal when you need to urinate.   Surgery is sometimes needed with electrical stimulation. If the electrodes are implanted, this is done through surgery.   Sometimes repairs need to be made through surgery. For example, the size of the bladder can be changed. This is usually done in severe cases only.  HOME CARE INSTRUCTIONS    Take any medications your healthcare provider prescribed or suggested. Follow the directions carefully.   Practice any lifestyle changes that are recommended. These might include:   Drinking less fluid or drinking at different times of the day. If you need to urinate often during the night, for example, you may need to stop drinking fluids early in the evening.   Cutting down on caffeine or alcohol. They can both make an overactive bladder worse. Caffeine is found in coffee, tea and sodas.   Doing Kegel exercises to strengthen muscles.   Losing weight, if that is recommended.   Eating a healthy and balanced diet. This will help you avoid constipation.   Keep a  journal or a log. You might be asked to record how much you drink and when, and also when you feel the need to urinate.   Learn how to care for implants or other devices, such as pessaries.  SEEK MEDICAL CARE IF:    Your overactive bladder gets worse.   You feel increased pain or irritation when you urinate.   You notice blood in your urine.   You have questions about any medications or devices that your healthcare provider recommended.   You notice blood, pus or swelling at the site of any test or treatment procedure.   You have an oral temperature above 102 F (38.9 C).  SEEK IMMEDIATE MEDICAL CARE IF:   You have an oral temperature above 102 F (38.9 C), not controlled by medicine. Document Released: 03/04/2009 Document Revised: 07/31/2011 Document Reviewed: 03/04/2009 [facility] Patient Information 2013 [place], [place].

## 2012-05-28 ENCOUNTER — Ambulatory Visit (INDEPENDENT_AMBULATORY_CARE_PROVIDER_SITE_OTHER)
Admission: RE | Admit: 2012-05-28 | Discharge: 2012-05-28 | Disposition: A | Discharge status: Home / Self Care | Payer: Medicare Other | Source: Ambulatory Visit

## 2012-05-28 DIAGNOSIS — M810 Age-related osteoporosis without current pathological fracture: Secondary | ICD-10-CM

## 2012-06-04 ENCOUNTER — Ambulatory Visit (INDEPENDENT_AMBULATORY_CARE_PROVIDER_SITE_OTHER): Payer: Medicare Other | Admitting: Internal Medicine

## 2012-06-04 ENCOUNTER — Encounter: Payer: Self-pay | Admitting: Internal Medicine

## 2012-06-04 VITALS — BP 120/68 | HR 75 | Temp 98.8°F | Resp 18 | Wt 150.0 lb

## 2012-06-04 DIAGNOSIS — E785 Hyperlipidemia, unspecified: Secondary | ICD-10-CM

## 2012-06-04 DIAGNOSIS — I10 Essential (primary) hypertension: Secondary | ICD-10-CM

## 2012-06-04 NOTE — Patient Instructions (Signed)
Limit your sodium (Salt) intake  Please check your blood pressure on a regular basis.  If it is consistently greater than 150/90, please make an office appointment.  CPX  11/14

## 2012-06-04 NOTE — Progress Notes (Signed)
Subjective:    Patient ID: [name] Small, female    DOB: 1929-01-11, 80 y.o.   MRN: [id number]  HPI  79 year old patient who is seen today for followup of hypertension. She was seen for her annual exam in November and blood pressure readings last year were consistently high. She was placed on lisinopril. Blood pressure today is well-controlled. She feels well. She has tolerated the medication well without cough or other side effects. She has a history of osteoporosis and previously had been on boniva;  this was discontinued about 2 years ago and she has had a recent DEXA scan. This revealed osteopenia. She has a history of OAB and takes TOVIAZ  periodically with nice results  Past Medical History  Diagnosis Date  . DIVERTICULOSIS, COLON 11/22/2006  . HYPERLIPIDEMIA 11/22/2006  . OSTEOPOROSIS 11/22/2006  . Menopausal syndrome (hot flashes)   . Headache   . Cancer Meloanoma left leg    History   Social History  . Marital Status: Widowed    Spouse Name: N/A    Number of Children: N/A  . Years of Education: N/A   Occupational History  . Not on file.   Social History Main Topics  . Smoking status: Never Smoker   . Smokeless tobacco: Never Used  . Alcohol Use: No  . Drug Use: No  . Sexually Active: No   Other Topics Concern  . Not on file   Social History Narrative  . No narrative on file    Past Surgical History  Procedure Date  . Appendectomy   . Cholecystectomy   . Bunionectomy     Family History  Problem Relation Age of Onset  . Alzheimer's disease Sister   . Cardiomyopathy Brother     No Known Allergies  Current Outpatient Prescriptions on File Prior to Visit  Medication Sig Dispense Refill  . acetaminophen (TYLENOL) 500 MG tablet Take 500 mg by mouth every 6 (six) hours as needed. For pain.      [name] aspirin 81 MG EC tablet Take 1 tablet (81 mg total) by mouth daily. Swallow whole.  30 tablet  12  . atorvastatin (LIPITOR) 40 MG tablet Take 1 tablet (40 mg total) by  mouth daily.  90 tablet  6  . Calcium Carbonate-Vitamin D (CALTRATE 600+D PO) Take 1 tablet by mouth daily.        . fesoterodine (TOVIAZ) 8 MG TB24 Take 1 tablet (8 mg total) by mouth daily.  30 tablet    . lisinopril (PRINIVIL,ZESTRIL) 20 MG tablet Take 1 tablet (20 mg total) by mouth daily.  90 tablet  3  . Polyvinyl Alcohol-Povidone (REFRESH OP) Place 1 drop into both eyes 2 (two) times daily.        BP 120/68  Pulse 75  Temp 98.8 F (37.1 C) (Oral)  Resp 18  Wt 150 lb (68.04 kg)  SpO2 97%     Review of Systems  Constitutional: Negative.   HENT: Negative for hearing loss, congestion, sore throat, rhinorrhea, dental problem, sinus pressure and tinnitus.   Eyes: Negative for pain, discharge and visual disturbance.  Respiratory: Negative for cough and shortness of breath.   Cardiovascular: Negative for chest pain, palpitations and leg swelling.  Gastrointestinal: Negative for nausea, vomiting, abdominal pain, diarrhea, constipation, blood in stool and abdominal distention.  Genitourinary: Negative for dysuria, urgency, frequency, hematuria, flank pain, vaginal bleeding, vaginal discharge, difficulty urinating, vaginal pain and pelvic pain.  Musculoskeletal: Negative for joint swelling, arthralgias and gait problem.  Skin: Negative for rash.  Neurological: Negative for dizziness, syncope, speech difficulty, weakness, numbness and headaches.  Hematological: Negative for adenopathy.  Psychiatric/Behavioral: Negative for behavioral problems, dysphoric mood and agitation. The patient is not nervous/anxious.        Objective:   Physical Exam  Constitutional: She appears well-developed and well-nourished. No distress.       Blood pressure 130-140/70          Assessment & Plan:   Hypertension. Nicely controlled. We'll continue lisinopril. Will maintain a low salt diet exercise regimen at home blood pressure monitoring Overactive bladder stable samples of TOV IAZ  dispensed Osteopenia. We'll continue calcium and vitamin D supplementation as well as exercise and active lifestyle. We'll consider a followup bone density study in 2-3 years

## 2012-07-08 ENCOUNTER — Telehealth: Payer: Self-pay | Admitting: Internal Medicine

## 2012-07-08 NOTE — Telephone Encounter (Signed)
Caller: Emberley/Patient; Phone: [phone]; Reason for Call: Returning call from message left 07/08/12 AM by office staff.  No current problems.  Last seen approximately 1 month ago.  Please call back.

## 2012-07-16 ENCOUNTER — Ambulatory Visit (INDEPENDENT_AMBULATORY_CARE_PROVIDER_SITE_OTHER): Payer: Medicare Other | Admitting: Internal Medicine

## 2012-07-16 DIAGNOSIS — M810 Age-related osteoporosis without current pathological fracture: Secondary | ICD-10-CM

## 2012-07-16 MED ORDER — DENOSUMAB 60 MG/ML ~~LOC~~ SOLN
60.0000 mg | Freq: Once | SUBCUTANEOUS | Status: AC
  Administered 2012-07-16: 60 mg via SUBCUTANEOUS

## 2012-09-12 ENCOUNTER — Encounter: Payer: Self-pay | Admitting: Internal Medicine

## 2012-09-12 ENCOUNTER — Ambulatory Visit (INDEPENDENT_AMBULATORY_CARE_PROVIDER_SITE_OTHER): Payer: Medicare Other | Admitting: Internal Medicine

## 2012-09-12 VITALS — BP 130/62 | HR 76 | Temp 97.8°F | Resp 18 | Wt 145.0 lb

## 2012-09-12 DIAGNOSIS — M1610 Unilateral primary osteoarthritis, unspecified hip: Secondary | ICD-10-CM

## 2012-09-12 DIAGNOSIS — I10 Essential (primary) hypertension: Secondary | ICD-10-CM

## 2012-09-12 DIAGNOSIS — M810 Age-related osteoporosis without current pathological fracture: Secondary | ICD-10-CM

## 2012-09-12 DIAGNOSIS — M1612 Unilateral primary osteoarthritis, left hip: Secondary | ICD-10-CM

## 2012-09-12 DIAGNOSIS — H1045 Other chronic allergic conjunctivitis: Secondary | ICD-10-CM

## 2012-09-12 DIAGNOSIS — H1013 Acute atopic conjunctivitis, bilateral: Secondary | ICD-10-CM

## 2012-09-12 DIAGNOSIS — M169 Osteoarthritis of hip, unspecified: Secondary | ICD-10-CM

## 2012-09-12 MED ORDER — METHYLPREDNISOLONE ACETATE 80 MG/ML IJ SUSP
80.0000 mg | Freq: Once | INTRAMUSCULAR | Status: AC
  Administered 2012-09-12: 80 mg via INTRAMUSCULAR

## 2012-09-12 NOTE — Patient Instructions (Addendum)
Take Aleve 200 mg twice daily for pain or swelling  Consider a nonsedating antihistamine such as Claritin or Zyrtec once dailyAllergic Conjunctivitis The conjunctiva is a thin membrane that covers the visible white part of the eyeball and the underside of the eyelids. This membrane protects and lubricates the eye. The membrane has small blood vessels running through it that can normally be seen. When the conjunctiva becomes inflamed, the condition is called conjunctivitis. In response to the inflammation, the conjunctival blood vessels become swollen. The swelling results in redness in the normally white part of the eye. The blood vessels of this membrane also react when a person has allergies and is then called allergic conjunctivitis. This condition usually lasts for as long as the allergy persists. Allergic conjunctivitis cannot be passed to another person (non-contagious). The likelihood of bacterial infection is great and the cause is not likely due to allergies if the inflamed eye has:  A sticky discharge.  Discharge or sticking together of the lids in the morning.  Scaling or flaking of the eyelids where the eyelashes come out.  Red swollen eyelids. CAUSES   Viruses.  Irritants such as foreign bodies.  Chemicals.  General allergic reactions.  Inflammation or serious diseases in the inside or the outside of the eye or the orbit (the boney cavity in which the eye sits) can cause a "red eye." SYMPTOMS   Eye redness.  Tearing.  Itchy eyes.  Burning feeling in the eyes.  Clear drainage from the eye.  Allergic reaction due to pollens or ragweed sensitivity. Seasonal allergic conjunctivitis is frequent in the spring when pollens are in the air and in the fall. DIAGNOSIS  This condition, in its many forms, is usually diagnosed based on the history and an ophthalmological exam. It usually involves both eyes. If your eyes react at the same time every year, allergies may be the  cause. While most "red eyes" are due to allergy or an infection, the role of an eye (ophthalmological) exam is important. The exam can rule out serious diseases of the eye or orbit. TREATMENT   Non-antibiotic eye drops, ointments, or medications by mouth may be prescribed if the ophthalmologist is sure the conjunctivitis is due to allergies alone.  Over-the-counter drops and ointments for allergic symptoms should be used only after other causes of conjunctivitis have been ruled out, or as your caregiver suggests. Medications by mouth are often prescribed if other allergy-related symptoms are present. If the ophthalmologist is sure that the conjunctivitis is due to allergies alone, treatment is normally limited to drops or ointments to reduce itching and burning. HOME CARE INSTRUCTIONS   Wash hands before and after applying drops or ointments, or touching the inflamed eye(s) or eyelids.  Do not let the eye dropper tip or ointment tube touch the eyelid when putting medicine in your eye.  Stop using your soft contact lenses and throw them away. Use a new pair of lenses when recovery is complete. You should run through sterilizing cycles at least three times before use after complete recovery if the old soft contact lenses are to be used. Hard contact lenses should be stopped. They need to be thoroughly sterilized before use after recovery.  Itching and burning eyes due to allergies is often relieved by using a cool cloth applied to closed eye(s). SEEK MEDICAL CARE IF:   Your problems do not go away after two or three days of treatment.  Your lids are sticky (especially in the morning when you  wake up) or stick together.  Discharge develops. Antibiotics may be needed either as drops, ointment, or by mouth.  You have extreme light sensitivity.  An oral temperature above 102 F (38.9 C) develops.  Pain in or around the eye or any other visual symptom develops. MAKE SURE YOU:   Understand  these instructions.  Will watch your condition.  Will get help right away if you are not doing well or get worse. Document Released: 07/29/2002 Document Revised: 07/31/2011 Document Reviewed: 06/24/2007 [facility] Patient Information 2013 [place], [place].

## 2012-09-12 NOTE — Progress Notes (Signed)
Subjective:    Patient ID: [name] Small, female    DOB: 06-18-1928, 79 y.o.   MRN: [id number]  HPI  79 year old patient who is seen today as a walk-in/organ with a number of complaints. She has had some itching behind the left ear. Also for the past few days she is awoke in the morning with the itchy copy eyes. She also complains a right knee and left hip discomfort. She does have a history of osteoporosis and mild osteoarthritis. She has taken no medications for these complaints.  Past Medical History  Diagnosis Date  . DIVERTICULOSIS, COLON 11/22/2006  . HYPERLIPIDEMIA 11/22/2006  . OSTEOPOROSIS 11/22/2006  . Menopausal syndrome (hot flashes)   . Headache   . Cancer Meloanoma left leg    History   Social History  . Marital Status: Widowed    Spouse Name: N/A    Number of Children: N/A  . Years of Education: N/A   Occupational History  . Not on file.   Social History Main Topics  . Smoking status: Never Smoker   . Smokeless tobacco: Never Used  . Alcohol Use: No  . Drug Use: No  . Sexually Active: No   Other Topics Concern  . Not on file   Social History Narrative  . No narrative on file    Past Surgical History  Procedure Laterality Date  . Appendectomy    . Cholecystectomy    . Bunionectomy      Family History  Problem Relation Age of Onset  . Alzheimer's disease Sister   . Cardiomyopathy Brother     No Known Allergies  Current Outpatient Prescriptions on File Prior to Visit  Medication Sig Dispense Refill  . acetaminophen (TYLENOL) 500 MG tablet Take 500 mg by mouth every 6 (six) hours as needed. For pain.      [name] aspirin 81 MG EC tablet Take 1 tablet (81 mg total) by mouth daily. Swallow whole.  30 tablet  12  . atorvastatin (LIPITOR) 40 MG tablet Take 1 tablet (40 mg total) by mouth daily.  90 tablet  6  . Calcium Carbonate-Vitamin D (CALTRATE 600+D PO) Take 1 tablet by mouth daily.        . fesoterodine (TOVIAZ) 8 MG TB24 Take 1 tablet (8 mg total) by mouth  daily.  30 tablet    . lisinopril (PRINIVIL,ZESTRIL) 20 MG tablet Take 1 tablet (20 mg total) by mouth daily.  90 tablet  3  . Polyvinyl Alcohol-Povidone (REFRESH OP) Place 1 drop into both eyes 2 (two) times daily.       No current facility-administered medications on file prior to visit.    BP 130/62  Pulse 76  Temp(Src) 97.8 F (36.6 C) (Oral)  Resp 18  Wt 145 lb (65.772 kg)  BMI 24.13 kg/m2  SpO2 98%       Review of Systems  Constitutional: Negative.   HENT: Negative for hearing loss, congestion, sore throat, rhinorrhea, dental problem, sinus pressure and tinnitus.   Eyes: Positive for discharge, redness and itching. Negative for pain and visual disturbance.  Respiratory: Negative for cough and shortness of breath.   Cardiovascular: Negative for chest pain, palpitations and leg swelling.  Gastrointestinal: Negative for nausea, vomiting, abdominal pain, diarrhea, constipation, blood in stool and abdominal distention.  Genitourinary: Negative for dysuria, urgency, frequency, hematuria, flank pain, vaginal bleeding, vaginal discharge, difficulty urinating, vaginal pain and pelvic pain.  Musculoskeletal: Positive for arthralgias. Negative for joint swelling and gait problem.  Skin:  Negative for rash.  Neurological: Negative for dizziness, syncope, speech difficulty, weakness, numbness and headaches.  Hematological: Negative for adenopathy.  Psychiatric/Behavioral: Negative for behavioral problems, dysphoric mood and agitation. The patient is not nervous/anxious.        Objective:   Physical Exam  Constitutional: She is oriented to person, place, and time. She appears well-developed and well-nourished.  HENT:  Head: Normocephalic.  Right Ear: External ear normal.  Left Ear: External ear normal.  Mouth/Throat: Oropharynx is clear and moist.  Eyes: Conjunctivae and EOM are normal. Pupils are equal, round, and reactive to light. Right eye exhibits no discharge.  Conjunctiva  mildly injected  Neck: Normal range of motion. Neck supple. No thyromegaly present.  Cardiovascular: Normal rate, regular rhythm, normal heart sounds and intact distal pulses.   Pulmonary/Chest: Effort normal and breath sounds normal.  Abdominal: Soft. Bowel sounds are normal. She exhibits no mass. There is no tenderness.  Musculoskeletal: Normal range of motion.  Lymphadenopathy:    She has no cervical adenopathy.  Neurological: She is alert and oriented to person, place, and time.  Skin: Skin is warm and dry. No rash noted.  Dry flaky skin involving the left postauricular area at the junction of the ear and the scalp  Psychiatric: She has a normal mood and affect. Her behavior is normal.          Assessment & Plan:   Nonspecific dermatitis Symptomatic osteoarthritis Allergic conjunctivitis  Patient will treat with Depo-Medrol. Local cortisone cream will be administered to the left postauricular area samples provided. Will consider a nonsedating antihistamine and/or Aleve if symptoms persist

## 2012-12-30 ENCOUNTER — Telehealth: Payer: Self-pay | Admitting: Internal Medicine

## 2012-12-30 NOTE — Telephone Encounter (Signed)
Pt was sched for Prolia on 8/26. She called and cancelled it, stating the Prolia was making all her hair fall out.

## 2013-01-14 ENCOUNTER — Ambulatory Visit: Payer: Medicare Other | Admitting: Internal Medicine

## 2013-01-14 ENCOUNTER — Ambulatory Visit: Payer: Medicare Other | Admitting: *Deleted

## 2013-04-03 ENCOUNTER — Ambulatory Visit (INDEPENDENT_AMBULATORY_CARE_PROVIDER_SITE_OTHER): Payer: Medicare Other | Admitting: Internal Medicine

## 2013-04-03 ENCOUNTER — Encounter: Payer: Self-pay | Admitting: Internal Medicine

## 2013-04-03 VITALS — BP 142/80 | HR 63 | Temp 98.1°F | Resp 18 | Ht 64.25 in | Wt 143.0 lb

## 2013-04-03 DIAGNOSIS — E785 Hyperlipidemia, unspecified: Secondary | ICD-10-CM

## 2013-04-03 DIAGNOSIS — M810 Age-related osteoporosis without current pathological fracture: Secondary | ICD-10-CM

## 2013-04-03 DIAGNOSIS — Z0000 Encounter for general adult medical examination without abnormal findings: Secondary | ICD-10-CM

## 2013-04-03 DIAGNOSIS — I10 Essential (primary) hypertension: Secondary | ICD-10-CM

## 2013-04-03 DIAGNOSIS — Z23 Encounter for immunization: Secondary | ICD-10-CM

## 2013-04-03 LAB — COMPREHENSIVE METABOLIC PANEL
ALT: 14 U/L (ref 0–35)
Albumin: 4 g/dL (ref 3.5–5.2)
CO2: 29 mEq/L (ref 19–32)
Calcium: 9.5 mg/dL (ref 8.4–10.5)
Chloride: 105 mEq/L (ref 96–112)
GFR: 52.16 mL/min — ABNORMAL LOW (ref >60.00)
Sodium: 140 mEq/L (ref 135–145)
Total Protein: 7.4 g/dL (ref 6.0–8.3)

## 2013-04-03 LAB — CBC WITH DIFFERENTIAL/PLATELET
Basophils Absolute: 0.1 10*3/uL (ref 0.0–0.1)
HCT: 41.7 % (ref 36.0–46.0)
Lymphocytes Relative: 28.6 % (ref 12.0–46.0)
Lymphs Abs: 1.8 10*3/uL (ref 0.7–4.0)
Monocytes Relative: 6.5 % (ref 3.0–12.0)
Neutrophils Relative %: 62.6 % (ref 43.0–77.0)
Platelets: 213 10*3/uL (ref 150.0–400.0)
RDW: 13.4 % (ref 11.5–14.6)

## 2013-04-03 LAB — TSH: TSH: 1.24 u[IU]/mL (ref 0.35–5.50)

## 2013-04-03 MED ORDER — LOSARTAN POTASSIUM 100 MG PO TABS: 100.0000 mg | ORAL_TABLET | Freq: Every day | ORAL | Status: DC

## 2013-04-03 NOTE — Progress Notes (Signed)
Subjective:    Patient ID: [name] Small, female    DOB: Jun 06, 1928, 79 y.o.   MRN: [id number]  HPI  Pre-visit discussion using our clinic review tool. No additional management support is needed unless otherwise documented below in the visit note.    79 year-old patient who is seen today for a health maintenance examination. Medical problems include dyslipidemia. She also has a history of osteoporosis and is has been treated with leave that in the past. More recently she was treated with Prolia which she did not tolerate and presently is on calcium and vitamin D supplements  For the  past 3 or 4 months the patient has developed a cough. She has treated hypertension which has been managed with lisinopril.   MWV  1. Risk factors, based on past  M,S,F history-  cardiovascular risk factors include dyslipidemia. She was evaluated about one year ago for atypical chest pain  2.  Physical activities: Remains quite active still works part-time at [employer]  3.  Depression/mood: No history of depression or mood disorder  4.  Hearing: No deficits  5.  ADL's: Independent in all aspects of daily living  6.  Fall risk: Low 7.  Home safety: No problems identified 8.  Height weight, and visual acuity; height and weight stable no change in visual acuity has had bilateral cataract extraction surgery  9.  Counseling: Heart healthy diet calcium vitamin D regular exercise are encouraged  10. Lab orders based on risk factors: Temperature profile including lipid panel will be reviewed 11. Referral: Not appropriate at this time  12. Care plan: Continue calcium and vitamin D supplement she has had a mammogram this year. Heart healthy diet and regular exercise all encouraged  13. Cognitive assessment: Alert and oriented with normal affect. No cognitive dysfunction       Review of Systems  Constitutional: Negative for fever, appetite change, fatigue and unexpected weight change.  HENT: Negative  for congestion, dental problem, ear pain, hearing loss, mouth sores, nosebleeds, sinus pressure, sore throat, tinnitus, trouble swallowing and voice change.   Eyes: Negative for photophobia, pain, redness and visual disturbance.  Respiratory: Negative for cough, chest tightness and shortness of breath.   Cardiovascular: Negative for chest pain, palpitations and leg swelling.  Gastrointestinal: Negative for nausea, vomiting, abdominal pain, diarrhea, constipation, blood in stool, abdominal distention and rectal pain.  Genitourinary: Negative for dysuria, urgency, frequency, hematuria, flank pain, vaginal bleeding, vaginal discharge, difficulty urinating, genital sores, vaginal pain, menstrual problem and pelvic pain.  Musculoskeletal: Negative for arthralgias, back pain and neck stiffness.  Skin: Negative for rash.  Neurological: Negative for dizziness, syncope, speech difficulty, weakness, light-headedness, numbness and headaches.  Hematological: Negative for adenopathy. Does not bruise/bleed easily.  Psychiatric/Behavioral: Negative for suicidal ideas, behavioral problems, self-injury, dysphoric mood and agitation. The patient is not nervous/anxious.        Objective:   Physical Exam  Constitutional: She is oriented to person, place, and time. She appears well-developed and well-nourished.  HENT:  Head: Normocephalic and atraumatic.  Right Ear: External ear normal.  Left Ear: External ear normal.  Mouth/Throat: Oropharynx is clear and moist.  Eyes: Conjunctivae and EOM are normal.  Neck: Normal range of motion. Neck supple. No JVD present. No thyromegaly present.  Cardiovascular: Normal rate, regular rhythm, normal heart sounds and intact distal pulses.   No murmur heard. Pedal pulses full  Pulmonary/Chest: Effort normal and breath sounds normal. She has no wheezes. She has no rales.  Abdominal:  Soft. Bowel sounds are normal. She exhibits no distension and no mass. There is no  tenderness. There is no rebound and no guarding.  Musculoskeletal: Normal range of motion. She exhibits no edema and no tenderness.  Neurological: She is alert and oriented to person, place, and time. She has normal reflexes. No cranial nerve deficit. She exhibits normal muscle tone. Coordination normal.  Skin: Skin is warm and dry. No rash noted.  Psychiatric: She has a normal mood and affect. Her behavior is normal.          Assessment & Plan:   Annual health assessment Dyslipidemia Osteoporosis  We'll continue calcium and vitamin D supplements and regular exercise Laboratory  Update Hypertension. We'll substitute losartan Recheck one year

## 2013-04-03 NOTE — Progress Notes (Signed)
Pre-visit discussion using our clinic review tool. No additional management support is needed unless otherwise documented below in the visit note.  

## 2013-04-03 NOTE — Patient Instructions (Addendum)
Take a calcium supplement, plus [phone] units of vitamin D    It is important that you exercise regularly, at least 20 minutes 3 to 4 times per week.  If you develop chest pain or shortness of breath seek  medical attention.  Return in 6 months for follow-up  Please check your blood pressure on a regular basis.  If it is consistently greater than 150/90, please make an office appointment.

## 2013-09-30 ENCOUNTER — Ambulatory Visit: Payer: Medicare Other | Admitting: Internal Medicine

## 2013-09-30 LAB — HM MAMMOGRAPHY: HM Mammogram: NEGATIVE

## 2013-10-02 ENCOUNTER — Ambulatory Visit (INDEPENDENT_AMBULATORY_CARE_PROVIDER_SITE_OTHER): Payer: Medicare Other | Admitting: Internal Medicine

## 2013-10-02 ENCOUNTER — Encounter: Payer: Self-pay | Admitting: Internal Medicine

## 2013-10-02 VITALS — BP 132/80 | HR 56 | Temp 98.7°F | Resp 20 | Ht 64.25 in | Wt 145.0 lb

## 2013-10-02 DIAGNOSIS — E785 Hyperlipidemia, unspecified: Secondary | ICD-10-CM

## 2013-10-02 DIAGNOSIS — I10 Essential (primary) hypertension: Secondary | ICD-10-CM

## 2013-10-02 NOTE — Patient Instructions (Signed)
Limit your sodium (Salt) intake    It is important that you exercise regularly, at least 20 minutes 3 to 4 times per week.  If you develop chest pain or shortness of breath seek  medical attention.  Return in 6 months for follow-up  

## 2013-10-02 NOTE — Progress Notes (Signed)
Pre-visit discussion using our clinic review tool. No additional management support is needed unless otherwise documented below in the visit note.  

## 2013-10-02 NOTE — Progress Notes (Signed)
Subjective:    Patient ID: [name] Small, female    DOB: 1929/04/27, 80 y.o.   MRN: [id number]  HPI 80 year old patient who is seen today for her biannual followup.  She has hypertension and dyslipidemia.  Doing quite well without concerns or complaints.  She remains quite active and still works at [employer].  No cardiopulmonary complaints.  She was seen 6 months ago for her annual exam. Past Medical History  Diagnosis Date  . DIVERTICULOSIS, COLON 11/22/2006  . HYPERLIPIDEMIA 11/22/2006  . OSTEOPOROSIS 11/22/2006  . Menopausal syndrome (hot flashes)   . Headache(784.0)   . Cancer Meloanoma left leg    History   Social History  . Marital Status: Widowed    Spouse Name: N/A    Number of Children: N/A  . Years of Education: N/A   Occupational History  . Not on file.   Social History Main Topics  . Smoking status: Never Smoker   . Smokeless tobacco: Never Used  . Alcohol Use: No  . Drug Use: No  . Sexual Activity: No   Other Topics Concern  . Not on file   Social History Narrative  . No narrative on file    Past Surgical History  Procedure Laterality Date  . Appendectomy    . Cholecystectomy    . Bunionectomy      Family History  Problem Relation Age of Onset  . Alzheimer's disease Sister   . Cardiomyopathy Brother     Allergies  Allergen Reactions  . Lisinopril     Cough    Current Outpatient Prescriptions on File Prior to Visit  Medication Sig Dispense Refill  . acetaminophen (TYLENOL) 500 MG tablet Take 500 mg by mouth every 6 (six) hours as needed. For pain.      [name] atorvastatin (LIPITOR) 40 MG tablet Take 1 tablet (40 mg total) by mouth daily.  90 tablet  6  . Calcium Carbonate-Vitamin D (CALTRATE 600+D PO) Take 1 tablet by mouth daily.        [name] losartan (COZAAR) 100 MG tablet Take 1 tablet (100 mg total) by mouth daily.  90 tablet  3  . Polyvinyl Alcohol-Povidone (REFRESH OP) Place 1 drop into both eyes 2 (two) times daily.       No current  facility-administered medications on file prior to visit.    BP 132/80  Pulse 56  Temp(Src) 98.7 F (37.1 C) (Oral)  Resp 20  Ht 5' 4.25" (1.632 m)  Wt 145 lb (65.772 kg)  BMI 24.69 kg/m2  SpO2 99%        Review of Systems  Constitutional: Negative.   HENT: Negative for congestion, dental problem, hearing loss, rhinorrhea, sinus pressure, sore throat and tinnitus.   Eyes: Negative for pain, discharge and visual disturbance.  Respiratory: Negative for cough and shortness of breath.   Cardiovascular: Negative for chest pain, palpitations and leg swelling.  Gastrointestinal: Negative for nausea, vomiting, abdominal pain, diarrhea, constipation, blood in stool and abdominal distention.  Genitourinary: Negative for dysuria, urgency, frequency, hematuria, flank pain, vaginal bleeding, vaginal discharge, difficulty urinating, vaginal pain and pelvic pain.  Musculoskeletal: Negative for arthralgias, gait problem and joint swelling.  Skin: Negative for rash.  Neurological: Negative for dizziness, syncope, speech difficulty, weakness, numbness and headaches.  Hematological: Negative for adenopathy.  Psychiatric/Behavioral: Negative for behavioral problems, dysphoric mood and agitation. The patient is not nervous/anxious.        Objective:   Physical Exam  Constitutional: She is oriented  to person, place, and time. She appears well-developed and well-nourished.  HENT:  Head: Normocephalic.  Right Ear: External ear normal.  Left Ear: External ear normal.  Mouth/Throat: Oropharynx is clear and moist.  Eyes: Conjunctivae and EOM are normal. Pupils are equal, round, and reactive to light.  Neck: Normal range of motion. Neck supple. No thyromegaly present.  Cardiovascular: Normal rate, regular rhythm, normal heart sounds and intact distal pulses.   Pulmonary/Chest: Effort normal and breath sounds normal.  Abdominal: Soft. Bowel sounds are normal. She exhibits no mass. There is no  tenderness.  Musculoskeletal: Normal range of motion.  Lymphadenopathy:    She has no cervical adenopathy.  Neurological: She is alert and oriented to person, place, and time.  Skin: Skin is warm and dry. No rash noted.  Psychiatric: She has a normal mood and affect. Her behavior is normal.          Assessment & Plan:   Hypertension well controlled Dyslipidemia.  Stable on atorvastatin  CPX 6 months No change therapy

## 2013-10-03 ENCOUNTER — Encounter: Payer: Self-pay | Admitting: Internal Medicine

## 2013-10-03 ENCOUNTER — Telehealth: Payer: Self-pay | Admitting: Internal Medicine

## 2013-10-03 NOTE — Telephone Encounter (Signed)
Relevant patient education mailed to patient.  

## 2013-10-21 ENCOUNTER — Ambulatory Visit (INDEPENDENT_AMBULATORY_CARE_PROVIDER_SITE_OTHER): Payer: Medicare Other | Admitting: Internal Medicine

## 2013-10-21 ENCOUNTER — Encounter: Payer: Self-pay | Admitting: Internal Medicine

## 2013-10-21 VITALS — BP 136/80 | HR 50 | Temp 98.3°F | Resp 18 | Ht 64.25 in | Wt 146.0 lb

## 2013-10-21 DIAGNOSIS — I10 Essential (primary) hypertension: Secondary | ICD-10-CM

## 2013-10-21 DIAGNOSIS — Z1231 Encounter for screening mammogram for malignant neoplasm of breast: Secondary | ICD-10-CM

## 2013-10-21 DIAGNOSIS — E785 Hyperlipidemia, unspecified: Secondary | ICD-10-CM

## 2013-10-21 NOTE — Progress Notes (Signed)
Pre-visit discussion using our clinic review tool. No additional management support is needed unless otherwise documented below in the visit note.  

## 2013-10-21 NOTE — Progress Notes (Signed)
   Subjective:    Patient ID: [name] Small, female    DOB: [birth year], 80 y.o.   MRN: [id number]  HPI  80 year old patient who is seen today on a recent screening mammogram.  This revealed dense breasts, but no abnormalities were concerns about breast cancer.  Routine screening was recommended.  The mammogram report dated specified that 40% of women have dense breasts.  Nonetheless, the patient was concerned. The mammogram report reviewed and discussed and the patient reassured.  Annual screening recommended    Review of Systems     Objective:   Physical Exam        Assessment & Plan:   Unremarkable mammogram.  Discussed and patient reassured.  Return in 6 months as scheduled for general followup and annual exam

## 2013-10-21 NOTE — Patient Instructions (Signed)
Limit your sodium (Salt) intake  Please check your blood pressure on a regular basis.  If it is consistently greater than 150/90, please make an office appointment.    It is important that you exercise regularly, at least 20 minutes 3 to 4 times per week.  If you develop chest pain or shortness of breath seek  medical attention.  Return in 6 months for follow-up  

## 2013-12-01 ENCOUNTER — Telehealth: Payer: Self-pay | Admitting: Internal Medicine

## 2013-12-01 NOTE — Telephone Encounter (Signed)
Patient Information:  Caller Name: Corie  Phone: [phone]  Patient: [name] Small  Gender: Female  DOB: 12-Jul-1928  Age: 80 Years  PCP: Bluford Kaufmann (Family Practice > 68yrs old)  Office Follow Up:  Does the office need to follow up with this patient?: No  Instructions For The Office: N/A  RN Note:  Reviewed the normal reactions for the Shingles Vaccine with the pt. and offered reassurance. Advised to give it at least a week for the symptoms to resolve. Will use an ice pack to the arm and Tylenol for the headache.  Symptoms  Reason For Call & Symptoms: Pt took the Shingles Vaccine on 11/27/13 and is having pins and needles feeling in her head on the Lf. side. Pt. has a large reddened area in the Rt. arm  where the injection was that itches.  Reviewed Health History In EMR: Yes  Reviewed Medications In EMR: Yes  Reviewed Allergies In EMR: Yes  Reviewed Surgeries / Procedures: Yes  Date of Onset of Symptoms: 11/27/2013  Treatments Tried: Tylenol  Treatments Tried Worked: No  Guideline(s) Used:  Immunization Reactions  Disposition Per Guideline:   Home Care  Reason For Disposition Reached:   Mild immunization reaction  Advice Given:  Cold Pack for Local Reaction at Injection Site:  Apply a cold pack or ice in a wet washcloth to the area for 20 minutes. Repeat in 1 hour.  Then apply as needed for the first 48 hours after the injection (Reason: reduce the pain and swelling).  Pain and Fever Medicines:  For pain or fever relief, take either acetaminophen or ibuprofen.  Call Back If:  Injection site starts to look infected  You become worse.  Shingles (Herpes Zoster; Zostavax) Vaccine:  Redness, swelling, pain, or itching at the injection site (in 33%).  Headache (in 10%).  Fever over 100.4 F (38.0 C) (in 10 %) and fever over 102 F (38.9 C) (in 1-2%).  Malaise, nausea, headache, body aches.  Patient Will Follow Care Advice:  YES

## 2013-12-01 NOTE — Telephone Encounter (Signed)
Noted  

## 2014-03-10 ENCOUNTER — Ambulatory Visit (INDEPENDENT_AMBULATORY_CARE_PROVIDER_SITE_OTHER): Payer: Medicare Other

## 2014-03-10 DIAGNOSIS — Z23 Encounter for immunization: Secondary | ICD-10-CM

## 2014-04-05 ENCOUNTER — Encounter (HOSPITAL_COMMUNITY): Payer: Self-pay

## 2014-04-05 ENCOUNTER — Emergency Department (HOSPITAL_COMMUNITY)
Admission: EM | Admit: 2014-04-05 | Discharge: 2014-04-05 | Disposition: A | Discharge status: Home / Self Care | Payer: Medicare Other | Attending: Emergency Medicine | Admitting: Emergency Medicine

## 2014-04-05 DIAGNOSIS — Z79899 Other long term (current) drug therapy: Secondary | ICD-10-CM | POA: Insufficient documentation

## 2014-04-05 DIAGNOSIS — E785 Hyperlipidemia, unspecified: Secondary | ICD-10-CM | POA: Insufficient documentation

## 2014-04-05 DIAGNOSIS — Z85820 Personal history of malignant melanoma of skin: Secondary | ICD-10-CM | POA: Diagnosis not present

## 2014-04-05 DIAGNOSIS — M810 Age-related osteoporosis without current pathological fracture: Secondary | ICD-10-CM | POA: Insufficient documentation

## 2014-04-05 DIAGNOSIS — Z8742 Personal history of other diseases of the female genital tract: Secondary | ICD-10-CM | POA: Diagnosis not present

## 2014-04-05 DIAGNOSIS — M549 Dorsalgia, unspecified: Secondary | ICD-10-CM

## 2014-04-05 DIAGNOSIS — M546 Pain in thoracic spine: Secondary | ICD-10-CM | POA: Diagnosis not present

## 2014-04-05 DIAGNOSIS — Z8719 Personal history of other diseases of the digestive system: Secondary | ICD-10-CM | POA: Insufficient documentation

## 2014-04-05 MED ORDER — METHOCARBAMOL 500 MG PO TABS: 500.0000 mg | ORAL_TABLET | Freq: Two times a day (BID) | ORAL | Status: DC

## 2014-04-05 NOTE — ED Notes (Signed)
Pt here for mid upper back pain since this morning. No known injury.

## 2014-04-05 NOTE — ED Provider Notes (Signed)
CSN: [id number]     Arrival date & time 04/05/14  0732 History   First MD Initiated Contact with Patient 04/05/14 [phone]     Chief Complaint  Patient presents with  . Back Pain     (Consider location/radiation/quality/duration/timing/severity/associated sxs/prior Treatment) HPI Comments: Patient presents today with a chief complaint of pain across her mid back.  She reports acute onset of pain around 2 AM this morning.  Pain has been constant since that time.  Pain does not radiate.  Pain worse with bending and raising her arms.  She has not taken anything for pain prior to arrival.  She reports that yesterday she was trimming rose bushes at home.  She denies numbness, tingling, bowel/bladder incontinence, fever, or chills.   She also denies chest pain, SOB, or abdominal pain.  She denies prior history of Cancer.  The history is provided by the patient.    Past Medical History  Diagnosis Date  . DIVERTICULOSIS, COLON 11/22/2006  . HYPERLIPIDEMIA 11/22/2006  . OSTEOPOROSIS 11/22/2006  . Menopausal syndrome (hot flashes)   . Headache(784.0)   . Cancer Meloanoma left leg   Past Surgical History  Procedure Laterality Date  . Appendectomy    . Cholecystectomy    . Bunionectomy     Family History  Problem Relation Age of Onset  . Alzheimer's disease Sister   . Cardiomyopathy Brother    History  Substance Use Topics  . Smoking status: Never Smoker   . Smokeless tobacco: Never Used  . Alcohol Use: No   OB History    No data available     Review of Systems  All other systems reviewed and are negative.     Allergies  Lisinopril  Home Medications   Prior to Admission medications   Medication Sig Start Date End Date Taking? Authorizing Provider  acetaminophen (TYLENOL) 500 MG tablet Take 500 mg by mouth every 6 (six) hours as needed. For pain.    Historical Provider, MD  atorvastatin (LIPITOR) 40 MG tablet Take 1 tablet (40 mg total) by mouth daily. 04/02/12   Marletta Lor, MD  Calcium Carbonate-Vitamin D (CALTRATE 600+D PO) Take 1 tablet by mouth daily.      Historical Provider, MD  losartan (COZAAR) 100 MG tablet Take 1 tablet (100 mg total) by mouth daily. 04/03/13   Marletta Lor, MD  Polyvinyl Alcohol-Povidone (REFRESH OP) Place 1 drop into both eyes 2 (two) times daily.    Historical Provider, MD   BP 151/66 mmHg  Pulse 66  Temp(Src) 97.5 F (36.4 C) (Oral)  Resp 15  Ht 5\' 5"  (1.651 m)  Wt 140 lb (63.504 kg)  BMI 23.30 kg/m2  SpO2 100% Physical Exam  Constitutional: She appears well-developed and well-nourished.  HENT:  Head: Normocephalic and atraumatic.  Mouth/Throat: Oropharynx is clear and moist.  Neck: Normal range of motion. Neck supple.  Cardiovascular: Normal rate, regular rhythm and normal heart sounds.   Pulses:      Radial pulses are 2+ on the right side, and 2+ on the left side.       Dorsalis pedis pulses are 2+ on the right side, and 2+ on the left side.  Pulmonary/Chest: Effort normal and breath sounds normal. No respiratory distress. She has no wheezes. She has no rales.  Musculoskeletal: Normal range of motion.  No tenderness to palpation of the cervical, thoracic, or lumbar spine Increased pain of the back with raising both arms.  Neurological: She is alert. She  has normal strength. No sensory deficit. Gait normal.  Reflex Scores:      Patellar reflexes are 2+ on the right side and 2+ on the left side. Grip strength 5/5 bilaterally Muscle strength 5/5 lower extremities bilaterally Distal sensation of both feet intact   Skin: Skin is warm and dry.  Psychiatric: She has a normal mood and affect.  Nursing note and vitals reviewed.   ED Course  Procedures (including critical care time) Labs Review Labs Reviewed - No data to display  Imaging Review No results found.   EKG Interpretation None      MDM   Final diagnoses:  None   Patient presents today with back pain that came on early this  morning.  She reports that she was trimming rose bushes yesterday.  Pain worse with movement and raising both arms.  She is neurovascularly intact. Patient with back pain.  No neurological deficits and normal neuro exam.  Patient can ambulate.  No loss of bowel or bladder control.  No concern for cauda equina.  No fever, night sweats, weight loss, h/o cancer, IVDU.  Suspect that the pain is muscular.  Patient stable for discharge.  Discharged home with muscle relaxer.  Patient also evaluated by Dr. Zenia Resides who is in agreement with the plan.  Return precautions given.     Hyman Bible, PA-C 04/05/14 1219  Leota Jacobsen, MD 04/06/14 [phone]

## 2014-04-07 ENCOUNTER — Ambulatory Visit (INDEPENDENT_AMBULATORY_CARE_PROVIDER_SITE_OTHER): Payer: Medicare Other | Admitting: Internal Medicine

## 2014-04-07 ENCOUNTER — Encounter: Payer: Self-pay | Admitting: Internal Medicine

## 2014-04-07 DIAGNOSIS — Z0000 Encounter for general adult medical examination without abnormal findings: Secondary | ICD-10-CM

## 2014-04-07 DIAGNOSIS — I10 Essential (primary) hypertension: Secondary | ICD-10-CM

## 2014-04-07 DIAGNOSIS — Z23 Encounter for immunization: Secondary | ICD-10-CM

## 2014-04-07 DIAGNOSIS — M810 Age-related osteoporosis without current pathological fracture: Secondary | ICD-10-CM

## 2014-04-07 DIAGNOSIS — E785 Hyperlipidemia, unspecified: Secondary | ICD-10-CM

## 2014-04-07 LAB — CBC WITH DIFFERENTIAL/PLATELET
BASOS PCT: 0.3 % (ref 0.0–3.0)
Basophils Absolute: 0 10*3/uL (ref 0.0–0.1)
Eosinophils Absolute: 0 10*3/uL (ref 0.0–0.7)
Eosinophils Relative: 0.4 % (ref 0.0–5.0)
HCT: 41.2 % (ref 36.0–46.0)
HEMOGLOBIN: 13.6 g/dL (ref 12.0–15.0)
LYMPHS ABS: 1.8 10*3/uL (ref 0.7–4.0)
Lymphocytes Relative: 24.9 % (ref 12.0–46.0)
MCHC: 33.1 g/dL (ref 30.0–36.0)
MCV: 88.3 fl (ref 78.0–100.0)
MONO ABS: 0.5 10*3/uL (ref 0.1–1.0)
Monocytes Relative: 6.5 % (ref 3.0–12.0)
Neutro Abs: 4.8 10*3/uL (ref 1.4–7.7)
Neutrophils Relative %: 67.9 % (ref 43.0–77.0)
Platelets: 224 10*3/uL (ref 150.0–400.0)
RBC: 4.66 Mil/uL (ref 3.87–5.11)
RDW: 13.6 % (ref 11.5–15.5)
WBC: 7.1 10*3/uL (ref 4.0–10.5)

## 2014-04-07 LAB — TSH: TSH: 1.8 u[IU]/mL (ref 0.35–4.50)

## 2014-04-07 NOTE — Progress Notes (Signed)
Pre visit review using our clinic review tool, if applicable. No additional management support is needed unless otherwise documented below in the visit note. 

## 2014-04-07 NOTE — Progress Notes (Signed)
Subjective:    Patient ID: [name] Small, female    DOB: [birth year], 80 y.o.   MRN: [id number]  HPI  Pre-visit discussion using our clinic review tool. No additional management support is needed unless otherwise documented below in the visit note.    80  year-old patient who is seen today for a health maintenance examination.  Medical problems include dyslipidemia. She also has a history of osteoporosis and is has been treated with Fosamax in the past. More recently she was treated with Prolia which she did not tolerate and presently is on calcium and vitamin D supplements  She has treated hypertension which has been well controlled with losartan.  She developed a cough on lisinopril.  Her only complaint today is some occasional urinary urgency.   MWV  1. Risk factors, based on past  M,S,F history-  cardiovascular risk factors include dyslipidemia and hypertension. She was evaluated about one year ago for atypical chest pain  2.  Physical activities: Remains quite active still works part-time at [employer]  3.  Depression/mood: No history of depression or mood disorder  4.  Hearing: No deficits  5.  ADL's: Independent in all aspects of daily living  6.  Fall risk: Low 7.  Home safety: No problems identified 8.  Height weight, and visual acuity; height and weight stable no change in visual acuity has had bilateral cataract extraction surgery  9.  Counseling: Heart healthy diet calcium vitamin D regular exercise are encouraged  10. Lab orders based on risk factors: Temperature profile including lipid panel will be reviewed 11. Referral: Not appropriate at this time  12. Care plan: Continue calcium and vitamin D supplement she has had a mammogram this year. Heart healthy diet and regular exercise all encouraged  13. Cognitive assessment: Alert and oriented with normal affect. No cognitive dysfunction  14.  Preventive services will include annual physical examinations as well as  annual mammograms.  She does receive annual eye examinations as well.  Patient was provided with a written and personalized care plan  15.  Provider list update includes primary care ophthalmology and radiology       Review of Systems  Constitutional: Negative for fever, appetite change, fatigue and unexpected weight change.  HENT: Negative for congestion, dental problem, ear pain, hearing loss, mouth sores, nosebleeds, sinus pressure, sore throat, tinnitus, trouble swallowing and voice change.   Eyes: Negative for photophobia, pain, redness and visual disturbance.  Respiratory: Negative for cough, chest tightness and shortness of breath.   Cardiovascular: Negative for chest pain, palpitations and leg swelling.  Gastrointestinal: Negative for nausea, vomiting, abdominal pain, diarrhea, constipation, blood in stool, abdominal distention and rectal pain.  Genitourinary: Positive for urgency. Negative for dysuria, frequency, hematuria, flank pain, vaginal bleeding, vaginal discharge, difficulty urinating, genital sores, vaginal pain, menstrual problem and pelvic pain.  Musculoskeletal: Positive for back pain. Negative for arthralgias and neck stiffness.  Skin: Negative for rash.  Neurological: Negative for dizziness, syncope, speech difficulty, weakness, light-headedness, numbness and headaches.  Hematological: Negative for adenopathy. Does not bruise/bleed easily.  Psychiatric/Behavioral: Negative for suicidal ideas, behavioral problems, self-injury, dysphoric mood and agitation. The patient is not nervous/anxious.        Objective:   Physical Exam  Constitutional: She is oriented to person, place, and time. She appears well-developed and well-nourished.  HENT:  Head: Normocephalic and atraumatic.  Right Ear: External ear normal.  Left Ear: External ear normal.  Mouth/Throat: Oropharynx is clear and moist.  Eyes:  Conjunctivae and EOM are normal.  Neck: Normal range of motion. Neck  supple. No JVD present. No thyromegaly present.  Cardiovascular: Normal rate, regular rhythm, normal heart sounds and intact distal pulses.   No murmur heard. Pulmonary/Chest: Effort normal and breath sounds normal. She has no wheezes. She has no rales.  Abdominal: Soft. Bowel sounds are normal. She exhibits no distension and no mass. There is no tenderness. There is no rebound and no guarding.  Musculoskeletal: Normal range of motion. She exhibits no edema or tenderness.  Neurological: She is alert and oriented to person, place, and time. She has normal reflexes. No cranial nerve deficit. She exhibits normal muscle tone. Coordination normal.  Skin: Skin is warm and dry. No rash noted.  Psychiatric: She has a normal mood and affect. Her behavior is normal.          Assessment & Plan:   Annual health assessment Dyslipidemia Osteoporosis  We'll continue calcium and vitamin D supplements and regular exercise Laboratory  Update will be reviewed Hypertension. We'll continue losartan.  Continue home blood pressure monitoring, and low-salt diet Recheck one year

## 2014-04-07 NOTE — Patient Instructions (Addendum)
Limit your sodium (Salt) intake    It is important that you exercise regularly, at least 20 minutes 3 to 4 times per week.  If you develop chest pain or shortness of breath seek  medical attention.  Take a calcium supplement, plus 800-1200 units of vitamin D  Please check your blood pressure on a regular basis.  If it is consistently greater than 150/90, please make an office appointment.  Return in one year for follow-up  Health Maintenance Adopting a healthy lifestyle and getting preventive care can go a long way to promote health and wellness. Talk with your health care provider about what schedule of regular examinations is right for you. This is a good chance for you to check in with your provider about disease prevention and staying healthy. In between checkups, there are plenty of things you can do on your own. Experts have done a lot of research about which lifestyle changes and preventive measures are most likely to keep you healthy. Ask your health care provider for more information. WEIGHT AND DIET  Eat a healthy diet  Be sure to include plenty of vegetables, fruits, low-fat dairy products, and lean protein.  Do not eat a lot of foods high in solid fats, added sugars, or salt.  Get regular exercise. This is one of the most important things you can do for your health.  Most adults should exercise for at least 150 minutes each week. The exercise should increase your heart rate and make you sweat (moderate-intensity exercise).  Most adults should also do strengthening exercises at least twice a week. This is in addition to the moderate-intensity exercise.  Maintain a healthy weight  Body mass index (BMI) is a measurement that can be used to identify possible weight problems. It estimates body fat based on height and weight. Your health care provider can help determine your BMI and help you achieve or maintain a healthy weight.  For females 20 years of age and older:   A BMI  below 18.5 is considered underweight.  A BMI of 18.5 to 24.9 is normal.  A BMI of 25 to 29.9 is considered overweight.  A BMI of 30 and above is considered obese.  Watch levels of cholesterol and blood lipids  You should start having your blood tested for lipids and cholesterol at 78 years of age, then have this test every 5 years.  You may need to have your cholesterol levels checked more often if:  Your lipid or cholesterol levels are high.  You are older than 78 years of age.  You are at high risk for heart disease.  CANCER SCREENING   Lung Cancer  Lung cancer screening is recommended for adults 55-80 years old who are at high risk for lung cancer because of a history of smoking.  A yearly low-dose CT scan of the lungs is recommended for people who:  Currently smoke.  Have quit within the past 15 years.  Have at least a 30-pack-year history of smoking. A pack year is smoking an average of one pack of cigarettes a day for 1 year.  Yearly screening should continue until it has been 15 years since you quit.  Yearly screening should stop if you develop a health problem that would prevent you from having lung cancer treatment.  Breast Cancer  Practice breast self-awareness. This means understanding how your breasts normally appear and feel.  It also means doing regular breast self-exams. Let your health care provider know about any   changes, no matter how small.  If you are in your 20s or 30s, you should have a clinical breast exam (CBE) by a health care provider every 1-3 years as part of a regular health exam.  If you are 40 or older, have a CBE every year. Also consider having a breast X-ray (mammogram) every year.  If you have a family history of breast cancer, talk to your health care provider about genetic screening.  If you are at high risk for breast cancer, talk to your health care provider about having an MRI and a mammogram every year.  Breast cancer gene  (BRCA) assessment is recommended for women who have family members with BRCA-related cancers. BRCA-related cancers include:  Breast.  Ovarian.  Tubal.  Peritoneal cancers.  Results of the assessment will determine the need for genetic counseling and BRCA1 and BRCA2 testing. Cervical Cancer Routine pelvic examinations to screen for cervical cancer are no longer recommended for nonpregnant women who are considered low risk for cancer of the pelvic organs (ovaries, uterus, and vagina) and who do not have symptoms. A pelvic examination may be necessary if you have symptoms including those associated with pelvic infections. Ask your health care provider if a screening pelvic exam is right for you.   The Pap test is the screening test for cervical cancer for women who are considered at risk.  If you had a hysterectomy for a problem that was not cancer or a condition that could lead to cancer, then you no longer need Pap tests.  If you are older than 65 years, and you have had normal Pap tests for the past 10 years, you no longer need to have Pap tests.  If you have had past treatment for cervical cancer or a condition that could lead to cancer, you need Pap tests and screening for cancer for at least 20 years after your treatment.  If you no longer get a Pap test, assess your risk factors if they change (such as having a new sexual partner). This can affect whether you should start being screened again.  Some women have medical problems that increase their chance of getting cervical cancer. If this is the case for you, your health care provider may recommend more frequent screening and Pap tests.  The human papillomavirus (HPV) test is another test that may be used for cervical cancer screening. The HPV test looks for the virus that can cause cell changes in the cervix. The cells collected during the Pap test can be tested for HPV.  The HPV test can be used to screen women 30 years of age and  older. Getting tested for HPV can extend the interval between normal Pap tests from three to five years.  An HPV test also should be used to screen women of any age who have unclear Pap test results.  After 78 years of age, women should have HPV testing as often as Pap tests.  Colorectal Cancer  This type of cancer can be detected and often prevented.  Routine colorectal cancer screening usually begins at 78 years of age and continues through 78 years of age.  Your health care provider may recommend screening at an earlier age if you have risk factors for colon cancer.  Your health care provider may also recommend using home test kits to check for hidden blood in the stool.  A small camera at the end of a tube can be used to examine your colon directly (sigmoidoscopy or colonoscopy).   is done to check for the earliest forms of colorectal cancer.  Routine screening usually begins at age 9.  Direct examination of the colon should be repeated every 5-10 years through 78 years of age. However, you may need to be screened more often if early forms of precancerous polyps or small growths are found. Skin Cancer  Check your skin from head to toe regularly.  Tell your health care provider about any new moles or changes in moles, especially if there is a change in a mole's shape or color.  Also tell your health care provider if you have a mole that is larger than the size of a pencil eraser.  Always use sunscreen. Apply sunscreen liberally and repeatedly throughout the day.  Protect yourself by wearing long sleeves, pants, a wide-brimmed hat, and sunglasses whenever you are outside. HEART DISEASE, DIABETES, AND HIGH BLOOD PRESSURE   Have your blood pressure checked at least every 1-2 years. High blood pressure causes heart disease and increases the risk of stroke.  If you are between 19 years and 34 years old, ask your health care provider if you should take aspirin to prevent  strokes.  Have regular diabetes screenings. This involves taking a blood sample to check your fasting blood sugar level.  If you are at a normal weight and have a low risk for diabetes, have this test once every three years after 78 years of age.  If you are overweight and have a high risk for diabetes, consider being tested at a younger age or more often. PREVENTING INFECTION  Hepatitis B  If you have a higher risk for hepatitis B, you should be screened for this virus. You are considered at high risk for hepatitis B if:  You were born in a country where hepatitis B is common. Ask your health care provider which countries are considered high risk.  Your parents were born in a high-risk country, and you have not been immunized against hepatitis B (hepatitis B vaccine).  You have HIV or AIDS.  You use needles to inject street drugs.  You live with someone who has hepatitis B.  You have had sex with someone who has hepatitis B.  You get hemodialysis treatment.  You take certain medicines for conditions, including cancer, organ transplantation, and autoimmune conditions. Hepatitis C  Blood testing is recommended for:  Everyone born from [birth year] through 1963.  Anyone with known risk factors for hepatitis C. Sexually transmitted infections (STIs)  You should be screened for sexually transmitted infections (STIs) including gonorrhea and chlamydia if:  You are sexually active and are younger than 78 years of age.  You are older than 78 years of age and your health care provider tells you that you are at risk for this type of infection.  Your sexual activity has changed since you were last screened and you are at an increased risk for chlamydia or gonorrhea. Ask your health care provider if you are at risk.  If you do not have HIV, but are at risk, it may be recommended that you take a prescription medicine daily to prevent HIV infection. This is called pre-exposure prophylaxis  (PrEP). You are considered at risk if:  You are sexually active and do not regularly use condoms or know the HIV status of your partner(s).  You take drugs by injection.  You are sexually active with a partner who has HIV. Talk with your health care provider about whether you are at high risk of being  infected with HIV. If you choose to begin PrEP, you should first be tested for HIV. You should then be tested every 3 months for as long as you are taking PrEP.  PREGNANCY   If you are premenopausal and you may become pregnant, ask your health care provider about preconception counseling.  If you may become pregnant, take 400 to 800 micrograms (mcg) of folic acid every day.  If you want to prevent pregnancy, talk to your health care provider about birth control (contraception). OSTEOPOROSIS AND MENOPAUSE   Osteoporosis is a disease in which the bones lose minerals and strength with aging. This can result in serious bone fractures. Your risk for osteoporosis can be identified using a bone density scan.  If you are 53 years of age or older, or if you are at risk for osteoporosis and fractures, ask your health care provider if you should be screened.  Ask your health care provider whether you should take a calcium or vitamin D supplement to lower your risk for osteoporosis.  Menopause may have certain physical symptoms and risks.  Hormone replacement therapy may reduce some of these symptoms and risks. Talk to your health care provider about whether hormone replacement therapy is right for you.  HOME CARE INSTRUCTIONS   Schedule regular health, dental, and eye exams.  Stay current with your immunizations.   Do not use any tobacco products including cigarettes, chewing tobacco, or electronic cigarettes.  If you are pregnant, do not drink alcohol.  If you are breastfeeding, limit how much and how often you drink alcohol.  Limit alcohol intake to no more than 1 drink per day for  nonpregnant women. One drink equals 12 ounces of beer, 5 ounces of wine, or 1 ounces of hard liquor.  Do not use street drugs.  Do not share needles.  Ask your health care provider for help if you need support or information about quitting drugs.  Tell your health care provider if you often feel depressed.  Tell your health care provider if you have ever been abused or do not feel safe at home. Document Released: 11/21/2010 Document Revised: 09/22/2013 Document Reviewed: 04/09/2013 [facility] Patient Information 2015 [place], [place]. This information is not intended to replace advice given to you by your health care provider. Make sure you discuss any questions you have with your health care provider.

## 2014-04-08 LAB — COMPREHENSIVE METABOLIC PANEL
ALT: 18 U/L (ref 0–35)
AST: 29 U/L (ref 0–37)
Albumin: 4.5 g/dL (ref 3.5–5.2)
Alkaline Phosphatase: 66 U/L (ref 39–117)
BUN: 12 mg/dL (ref 6–23)
CO2: 22 mEq/L (ref 19–32)
Calcium: 9.4 mg/dL (ref 8.4–10.5)
Chloride: 110 mEq/L (ref 96–112)
Creatinine, Ser: 1.1 mg/dL (ref 0.4–1.2)
GFR: 49.36 mL/min — ABNORMAL LOW (ref >60.00)
Glucose, Bld: 103 mg/dL — ABNORMAL HIGH (ref 70–99)
Potassium: 4.3 mEq/L (ref 3.5–5.1)
Sodium: 145 mEq/L (ref 135–145)
Total Bilirubin: 0.7 mg/dL (ref 0.2–1.2)
Total Protein: 7.6 g/dL (ref 6.0–8.3)

## 2014-04-08 LAB — LIPID PANEL
CHOL/HDL RATIO: 4
Cholesterol: 277 mg/dL — ABNORMAL HIGH (ref 0–200)
HDL: 62 mg/dL (ref >39.00)
LDL CALC: 192 mg/dL — AB (ref 0–99)
NONHDL: 215
TRIGLYCERIDES: 114 mg/dL (ref 0.0–149.0)
VLDL: 22.8 mg/dL (ref 0.0–40.0)

## 2014-04-09 ENCOUNTER — Other Ambulatory Visit: Payer: Self-pay | Admitting: *Deleted

## 2014-04-09 MED ORDER — LOSARTAN POTASSIUM 100 MG PO TABS: 100.0000 mg | ORAL_TABLET | Freq: Every day | ORAL | Status: DC

## 2014-04-09 MED ORDER — ATORVASTATIN CALCIUM 40 MG PO TABS: 40.0000 mg | ORAL_TABLET | Freq: Every day | ORAL | Status: DC

## 2014-05-05 ENCOUNTER — Encounter: Payer: Self-pay | Admitting: Family Medicine

## 2014-05-05 ENCOUNTER — Ambulatory Visit (INDEPENDENT_AMBULATORY_CARE_PROVIDER_SITE_OTHER): Payer: Medicare Other | Admitting: Family Medicine

## 2014-05-05 VITALS — BP 130/84 | HR 60 | Temp 98.1°F | Ht 64.5 in | Wt 146.0 lb

## 2014-05-05 DIAGNOSIS — R350 Frequency of micturition: Secondary | ICD-10-CM

## 2014-05-05 DIAGNOSIS — N390 Urinary tract infection, site not specified: Secondary | ICD-10-CM

## 2014-05-05 LAB — POCT URINALYSIS DIPSTICK
Bilirubin, UA: NEGATIVE
Glucose, UA: NEGATIVE
Ketones, UA: NEGATIVE
Nitrite, UA: NEGATIVE
PROTEIN UA: NEGATIVE
RBC UA: NEGATIVE
SPEC GRAV UA: 1.01
Urobilinogen, UA: 0.2
pH, UA: 5

## 2014-05-05 MED ORDER — NITROFURANTOIN MONOHYD MACRO 100 MG PO CAPS: 100.0000 mg | ORAL_CAPSULE | Freq: Two times a day (BID) | ORAL | Status: DC

## 2014-05-05 NOTE — Progress Notes (Signed)
Pre visit review using our clinic review tool, if applicable. No additional management support is needed unless otherwise documented below in the visit note. 

## 2014-05-05 NOTE — Progress Notes (Signed)
   Subjective:    Patient ID: [name] Small, female    DOB: 06-02-1928, 80 y.o.   MRN: [id number]  HPI Here for 3 weeks of urinary urgency, burning, and some incontinence. No fever. Using Azo.    Review of Systems  Constitutional: Negative.   Genitourinary: Positive for dysuria, urgency and frequency. Negative for flank pain and pelvic pain.       Objective:   Physical Exam  Constitutional: She appears well-nourished.  Abdominal: Soft. Bowel sounds are normal. She exhibits no distension and no mass. There is no tenderness. There is no rebound and no guarding.          Assessment & Plan:  Treat with Macrobid. Send the sample for culture.

## 2014-05-08 LAB — URINE CULTURE

## 2014-05-12 MED ORDER — CIPROFLOXACIN HCL 500 MG PO TABS: 500.0000 mg | ORAL_TABLET | Freq: Two times a day (BID) | ORAL | Status: DC

## 2014-05-12 NOTE — Addendum Note (Signed)
Addended by: Aggie Hacker A on: 05/12/2014 11:03 AM   Modules accepted: Orders, Medications

## 2014-06-01 DIAGNOSIS — M1712 Unilateral primary osteoarthritis, left knee: Secondary | ICD-10-CM | POA: Diagnosis not present

## 2014-06-22 HISTORY — PX: REVISION TOTAL HIP ARTHROPLASTY: SHX766

## 2014-07-08 ENCOUNTER — Encounter (HOSPITAL_COMMUNITY): Payer: Self-pay | Admitting: Emergency Medicine

## 2014-07-08 ENCOUNTER — Inpatient Hospital Stay (HOSPITAL_COMMUNITY)
Admission: EM | Admit: 2014-07-08 | Discharge: 2014-07-11 | DRG: 482 | Disposition: A | Discharge status: Skilled Nursing Facility | Payer: Medicare Other | Attending: Orthopedic Surgery | Admitting: Orthopedic Surgery

## 2014-07-08 ENCOUNTER — Inpatient Hospital Stay (HOSPITAL_COMMUNITY): Payer: Medicare Other

## 2014-07-08 ENCOUNTER — Inpatient Hospital Stay (HOSPITAL_COMMUNITY): Payer: Medicare Other | Admitting: Anesthesiology

## 2014-07-08 ENCOUNTER — Encounter (HOSPITAL_COMMUNITY)
Admission: EM | Disposition: A | Discharge status: Skilled Nursing Facility | Payer: Self-pay | Source: Home / Self Care | Attending: Internal Medicine

## 2014-07-08 ENCOUNTER — Other Ambulatory Visit: Payer: Self-pay | Admitting: Emergency Medicine

## 2014-07-08 ENCOUNTER — Emergency Department (HOSPITAL_COMMUNITY): Payer: Medicare Other

## 2014-07-08 DIAGNOSIS — W108XXA Fall (on) (from) other stairs and steps, initial encounter: Secondary | ICD-10-CM | POA: Diagnosis present

## 2014-07-08 DIAGNOSIS — Z9889 Other specified postprocedural states: Secondary | ICD-10-CM

## 2014-07-08 DIAGNOSIS — I10 Essential (primary) hypertension: Secondary | ICD-10-CM | POA: Diagnosis not present

## 2014-07-08 DIAGNOSIS — S72142A Displaced intertrochanteric fracture of left femur, initial encounter for closed fracture: Principal | ICD-10-CM | POA: Diagnosis present

## 2014-07-08 DIAGNOSIS — S72009A Fracture of unspecified part of neck of unspecified femur, initial encounter for closed fracture: Secondary | ICD-10-CM

## 2014-07-08 DIAGNOSIS — W19XXXA Unspecified fall, initial encounter: Secondary | ICD-10-CM

## 2014-07-08 DIAGNOSIS — D72829 Elevated white blood cell count, unspecified: Secondary | ICD-10-CM | POA: Diagnosis present

## 2014-07-08 DIAGNOSIS — Y92008 Other place in unspecified non-institutional (private) residence as the place of occurrence of the external cause: Secondary | ICD-10-CM | POA: Diagnosis not present

## 2014-07-08 DIAGNOSIS — M810 Age-related osteoporosis without current pathological fracture: Secondary | ICD-10-CM | POA: Diagnosis present

## 2014-07-08 DIAGNOSIS — Z4789 Encounter for other orthopedic aftercare: Secondary | ICD-10-CM | POA: Diagnosis not present

## 2014-07-08 DIAGNOSIS — R2689 Other abnormalities of gait and mobility: Secondary | ICD-10-CM | POA: Diagnosis not present

## 2014-07-08 DIAGNOSIS — R278 Other lack of coordination: Secondary | ICD-10-CM | POA: Diagnosis not present

## 2014-07-08 DIAGNOSIS — Z9049 Acquired absence of other specified parts of digestive tract: Secondary | ICD-10-CM | POA: Diagnosis present

## 2014-07-08 DIAGNOSIS — E785 Hyperlipidemia, unspecified: Secondary | ICD-10-CM | POA: Diagnosis not present

## 2014-07-08 DIAGNOSIS — S72102A Unspecified trochanteric fracture of left femur, initial encounter for closed fracture: Secondary | ICD-10-CM | POA: Diagnosis not present

## 2014-07-08 DIAGNOSIS — S72142D Displaced intertrochanteric fracture of left femur, subsequent encounter for closed fracture with routine healing: Secondary | ICD-10-CM | POA: Diagnosis not present

## 2014-07-08 DIAGNOSIS — Z79899 Other long term (current) drug therapy: Secondary | ICD-10-CM | POA: Diagnosis not present

## 2014-07-08 DIAGNOSIS — M21252 Flexion deformity, left hip: Secondary | ICD-10-CM | POA: Diagnosis not present

## 2014-07-08 DIAGNOSIS — Z01818 Encounter for other preprocedural examination: Secondary | ICD-10-CM | POA: Diagnosis not present

## 2014-07-08 DIAGNOSIS — M6281 Muscle weakness (generalized): Secondary | ICD-10-CM | POA: Diagnosis not present

## 2014-07-08 DIAGNOSIS — S72132B Displaced apophyseal fracture of left femur, initial encounter for open fracture type I or II: Secondary | ICD-10-CM | POA: Diagnosis not present

## 2014-07-08 DIAGNOSIS — Z8781 Personal history of (healed) traumatic fracture: Secondary | ICD-10-CM

## 2014-07-08 DIAGNOSIS — R52 Pain, unspecified: Secondary | ICD-10-CM | POA: Diagnosis not present

## 2014-07-08 DIAGNOSIS — M25552 Pain in left hip: Secondary | ICD-10-CM | POA: Diagnosis not present

## 2014-07-08 DIAGNOSIS — S72102D Unspecified trochanteric fracture of left femur, subsequent encounter for closed fracture with routine healing: Secondary | ICD-10-CM | POA: Diagnosis not present

## 2014-07-08 HISTORY — DX: Essential (primary) hypertension: I10

## 2014-07-08 HISTORY — PX: FEMUR IM NAIL: SHX1597

## 2014-07-08 LAB — BASIC METABOLIC PANEL
Anion gap: 7 (ref 5–15)
BUN: 13 mg/dL (ref 6–23)
CHLORIDE: 108 mmol/L (ref 96–112)
CO2: 24 mmol/L (ref 19–32)
CREATININE: 1.06 mg/dL (ref 0.50–1.10)
Calcium: 8.9 mg/dL (ref 8.4–10.5)
GFR calc Af Amer: 55 mL/min — ABNORMAL LOW (ref >90)
GFR calc non Af Amer: 47 mL/min — ABNORMAL LOW (ref >90)
Glucose, Bld: 187 mg/dL — ABNORMAL HIGH (ref 70–99)
Potassium: 4 mmol/L (ref 3.5–5.1)
Sodium: 139 mmol/L (ref 135–145)

## 2014-07-08 LAB — CBC WITH DIFFERENTIAL/PLATELET
BASOS ABS: 0 10*3/uL (ref 0.0–0.1)
BASOS PCT: 0 % (ref 0–1)
EOS PCT: 1 % (ref 0–5)
Eosinophils Absolute: 0.1 10*3/uL (ref 0.0–0.7)
HEMATOCRIT: 40 % (ref 36.0–46.0)
HEMOGLOBIN: 12.9 g/dL (ref 12.0–15.0)
Lymphocytes Relative: 13 % (ref 12–46)
Lymphs Abs: 1.7 10*3/uL (ref 0.7–4.0)
MCH: 29.6 pg (ref 26.0–34.0)
MCHC: 32.3 g/dL (ref 30.0–36.0)
MCV: 91.7 fL (ref 78.0–100.0)
MONO ABS: 0.5 10*3/uL (ref 0.1–1.0)
Monocytes Relative: 4 % (ref 3–12)
NEUTROS ABS: 10.2 10*3/uL — AB (ref 1.7–7.7)
Neutrophils Relative %: 82 % — ABNORMAL HIGH (ref 43–77)
Platelets: 196 10*3/uL (ref 150–400)
RBC: 4.36 MIL/uL (ref 3.87–5.11)
RDW: 13.3 % (ref 11.5–15.5)
WBC: 12.5 10*3/uL — ABNORMAL HIGH (ref 4.0–10.5)

## 2014-07-08 LAB — ABO/RH: ABO/RH(D): O POS

## 2014-07-08 LAB — PROTIME-INR
INR: 1.04 (ref 0.00–1.49)
Prothrombin Time: 13.7 seconds (ref 11.6–15.2)

## 2014-07-08 LAB — TYPE AND SCREEN
ABO/RH(D): O POS
Antibody Screen: NEGATIVE

## 2014-07-08 SURGERY — INSERTION, INTRAMEDULLARY ROD, FEMUR
Anesthesia: General | Site: Hip | Laterality: Left

## 2014-07-08 MED ORDER — CEFAZOLIN SODIUM-DEXTROSE 2-3 GM-% IV SOLR
INTRAVENOUS | Status: AC
  Filled 2014-07-08: qty 50

## 2014-07-08 MED ORDER — SUCCINYLCHOLINE CHLORIDE 20 MG/ML IJ SOLN
INTRAMUSCULAR | Status: DC | PRN
  Administered 2014-07-08: 100 mg via INTRAVENOUS

## 2014-07-08 MED ORDER — ENOXAPARIN SODIUM 30 MG/0.3ML ~~LOC~~ SOLN
30.0000 mg | SUBCUTANEOUS | Status: DC
  Filled 2014-07-08: qty 0.3

## 2014-07-08 MED ORDER — FENTANYL CITRATE 0.05 MG/ML IJ SOLN: 50.0000 ug | INTRAMUSCULAR | Status: DC | PRN

## 2014-07-08 MED ORDER — SODIUM CHLORIDE 0.9 % IJ SOLN
INTRAMUSCULAR | Status: AC
  Filled 2014-07-08: qty 10

## 2014-07-08 MED ORDER — FENTANYL CITRATE 0.05 MG/ML IJ SOLN
INTRAMUSCULAR | Status: AC
  Filled 2014-07-08: qty 2

## 2014-07-08 MED ORDER — ACETAMINOPHEN 650 MG RE SUPP: 650.0000 mg | Freq: Four times a day (QID) | RECTAL | Status: DC | PRN

## 2014-07-08 MED ORDER — ONDANSETRON HCL 4 MG/2ML IJ SOLN
INTRAMUSCULAR | Status: DC | PRN
  Administered 2014-07-08: 4 mg via INTRAVENOUS

## 2014-07-08 MED ORDER — HYDROCODONE-ACETAMINOPHEN 5-325 MG PO TABS: 1.0000 | ORAL_TABLET | Freq: Four times a day (QID) | ORAL | Status: DC | PRN

## 2014-07-08 MED ORDER — DEXAMETHASONE SODIUM PHOSPHATE 10 MG/ML IJ SOLN
INTRAMUSCULAR | Status: DC | PRN
  Administered 2014-07-08: 10 mg via INTRAVENOUS

## 2014-07-08 MED ORDER — LIP MEDEX EX OINT
TOPICAL_OINTMENT | CUTANEOUS | Status: AC
  Filled 2014-07-08: qty 7

## 2014-07-08 MED ORDER — PROPOFOL 10 MG/ML IV BOLUS
INTRAVENOUS | Status: AC
  Filled 2014-07-08: qty 20

## 2014-07-08 MED ORDER — HYDRALAZINE HCL 20 MG/ML IJ SOLN
10.0000 mg | Freq: Four times a day (QID) | INTRAMUSCULAR | Status: DC | PRN
  Administered 2014-07-08: 10 mg via INTRAVENOUS
  Filled 2014-07-08: qty 1

## 2014-07-08 MED ORDER — GLYCOPYRROLATE 0.2 MG/ML IJ SOLN
INTRAMUSCULAR | Status: DC | PRN
  Administered 2014-07-08: 0.2 mg via INTRAVENOUS

## 2014-07-08 MED ORDER — ROCURONIUM BROMIDE 100 MG/10ML IV SOLN
INTRAVENOUS | Status: AC
  Filled 2014-07-08: qty 1

## 2014-07-08 MED ORDER — MORPHINE SULFATE 2 MG/ML IJ SOLN
0.5000 mg | INTRAMUSCULAR | Status: DC | PRN
  Administered 2014-07-09: 0.5 mg via INTRAVENOUS
  Filled 2014-07-08: qty 1

## 2014-07-08 MED ORDER — WARFARIN SODIUM 2.5 MG PO TABS
2.5000 mg | ORAL_TABLET | ORAL | Status: AC
  Administered 2014-07-08: 2.5 mg via ORAL
  Filled 2014-07-08: qty 1

## 2014-07-08 MED ORDER — LIDOCAINE HCL (CARDIAC) 20 MG/ML IV SOLN
INTRAVENOUS | Status: DC | PRN
  Administered 2014-07-08: 50 mg via INTRAVENOUS

## 2014-07-08 MED ORDER — HYDROCODONE-ACETAMINOPHEN 5-325 MG PO TABS
1.0000 | ORAL_TABLET | Freq: Four times a day (QID) | ORAL | Status: DC | PRN
  Administered 2014-07-09: 1 via ORAL
  Administered 2014-07-10 – 2014-07-11 (×3): 2 via ORAL
  Filled 2014-07-08 (×3): qty 2
  Filled 2014-07-08: qty 1

## 2014-07-08 MED ORDER — DEXAMETHASONE SODIUM PHOSPHATE 10 MG/ML IJ SOLN
INTRAMUSCULAR | Status: AC
  Filled 2014-07-08: qty 1

## 2014-07-08 MED ORDER — HYDROMORPHONE HCL 2 MG/ML IJ SOLN
INTRAMUSCULAR | Status: AC
Start: 2014-07-08 — End: 2014-07-08
  Filled 2014-07-08: qty 1

## 2014-07-08 MED ORDER — ACETAMINOPHEN 325 MG PO TABS: 650.0000 mg | ORAL_TABLET | Freq: Four times a day (QID) | ORAL | Status: DC | PRN

## 2014-07-08 MED ORDER — ONDANSETRON HCL 4 MG/2ML IJ SOLN: 4.0000 mg | Freq: Four times a day (QID) | INTRAMUSCULAR | Status: DC | PRN

## 2014-07-08 MED ORDER — LACTATED RINGERS IV SOLN
INTRAVENOUS | Status: DC
  Administered 2014-07-08: 1000 mL via INTRAVENOUS

## 2014-07-08 MED ORDER — EPHEDRINE SULFATE 50 MG/ML IJ SOLN
INTRAMUSCULAR | Status: DC | PRN
  Administered 2014-07-08: 5 mg via INTRAVENOUS

## 2014-07-08 MED ORDER — CEFAZOLIN SODIUM-DEXTROSE 2-3 GM-% IV SOLR: INTRAVENOUS | Status: DC | PRN

## 2014-07-08 MED ORDER — PHENOL 1.4 % MT LIQD
1.0000 | OROMUCOSAL | Status: DC | PRN
  Filled 2014-07-08: qty 177

## 2014-07-08 MED ORDER — LACTATED RINGERS IV SOLN
INTRAVENOUS | Status: DC | PRN
  Administered 2014-07-08: 17:00:00 via INTRAVENOUS

## 2014-07-08 MED ORDER — POTASSIUM CHLORIDE IN NACL 20-0.9 MEQ/L-% IV SOLN
INTRAVENOUS | Status: AC
  Administered 2014-07-08: 23:00:00 via INTRAVENOUS
  Filled 2014-07-08 (×5): qty 1000

## 2014-07-08 MED ORDER — MENTHOL 3 MG MT LOZG
1.0000 | LOZENGE | OROMUCOSAL | Status: DC | PRN
  Filled 2014-07-08 (×2): qty 9

## 2014-07-08 MED ORDER — PROPOFOL 10 MG/ML IV BOLUS
INTRAVENOUS | Status: DC | PRN
  Administered 2014-07-08: 120 mg via INTRAVENOUS

## 2014-07-08 MED ORDER — ONDANSETRON HCL 4 MG/2ML IJ SOLN
INTRAMUSCULAR | Status: AC
  Filled 2014-07-08: qty 2

## 2014-07-08 MED ORDER — SODIUM CHLORIDE 0.9 % IV SOLN
INTRAVENOUS | Status: DC
  Administered 2014-07-08: 11:00:00 via INTRAVENOUS

## 2014-07-08 MED ORDER — WARFARIN - PHARMACIST DOSING INPATIENT
Freq: Every day | Status: DC
  Administered 2014-07-11: 09:00:00

## 2014-07-08 MED ORDER — METOCLOPRAMIDE HCL 10 MG PO TABS: 5.0000 mg | ORAL_TABLET | Freq: Three times a day (TID) | ORAL | Status: DC | PRN

## 2014-07-08 MED ORDER — CEFAZOLIN SODIUM-DEXTROSE 2-3 GM-% IV SOLR
2.0000 g | Freq: Three times a day (TID) | INTRAVENOUS | Status: AC
  Administered 2014-07-09 (×2): 2 g via INTRAVENOUS
  Filled 2014-07-08 (×2): qty 50

## 2014-07-08 MED ORDER — GLYCOPYRROLATE 0.2 MG/ML IJ SOLN
INTRAMUSCULAR | Status: AC
  Filled 2014-07-08: qty 1

## 2014-07-08 MED ORDER — ATORVASTATIN CALCIUM 40 MG PO TABS
40.0000 mg | ORAL_TABLET | Freq: Every day | ORAL | Status: DC
  Administered 2014-07-09 – 2014-07-11 (×3): 40 mg via ORAL
  Filled 2014-07-08 (×4): qty 1

## 2014-07-08 MED ORDER — HYDROMORPHONE HCL 1 MG/ML IJ SOLN: 0.2500 mg | INTRAMUSCULAR | Status: DC | PRN

## 2014-07-08 MED ORDER — METOCLOPRAMIDE HCL 5 MG/ML IJ SOLN: 5.0000 mg | Freq: Three times a day (TID) | INTRAMUSCULAR | Status: DC | PRN

## 2014-07-08 MED ORDER — LABETALOL HCL 5 MG/ML IV SOLN
INTRAVENOUS | Status: AC
  Filled 2014-07-08: qty 4

## 2014-07-08 MED ORDER — ONDANSETRON HCL 4 MG PO TABS
4.0000 mg | ORAL_TABLET | Freq: Four times a day (QID) | ORAL | Status: DC | PRN
  Administered 2014-07-11: 4 mg via ORAL
  Filled 2014-07-08: qty 1

## 2014-07-08 MED ORDER — MORPHINE SULFATE 2 MG/ML IJ SOLN
0.5000 mg | INTRAMUSCULAR | Status: DC | PRN
  Administered 2014-07-08 (×2): 0.5 mg via INTRAVENOUS
  Filled 2014-07-08 (×2): qty 1

## 2014-07-08 MED ORDER — CEFAZOLIN SODIUM-DEXTROSE 2-3 GM-% IV SOLR
INTRAVENOUS | Status: DC | PRN
  Administered 2014-07-08: 2 g via INTRAVENOUS

## 2014-07-08 MED ORDER — EPHEDRINE SULFATE 50 MG/ML IJ SOLN
INTRAMUSCULAR | Status: AC
  Filled 2014-07-08: qty 1

## 2014-07-08 MED ORDER — MEPERIDINE HCL 50 MG/ML IJ SOLN: 6.2500 mg | INTRAMUSCULAR | Status: DC | PRN

## 2014-07-08 MED ORDER — LOSARTAN POTASSIUM 50 MG PO TABS
100.0000 mg | ORAL_TABLET | Freq: Every day | ORAL | Status: DC
  Administered 2014-07-09 – 2014-07-11 (×3): 100 mg via ORAL
  Filled 2014-07-08 (×4): qty 2

## 2014-07-08 MED ORDER — SODIUM CHLORIDE 0.9 % IV BOLUS (SEPSIS)
500.0000 mL | Freq: Once | INTRAVENOUS | Status: AC
  Administered 2014-07-08: 500 mL via INTRAVENOUS

## 2014-07-08 MED ORDER — FENTANYL CITRATE 0.05 MG/ML IJ SOLN
INTRAMUSCULAR | Status: DC | PRN
  Administered 2014-07-08 (×2): 50 ug via INTRAVENOUS

## 2014-07-08 MED ORDER — PROMETHAZINE HCL 25 MG/ML IJ SOLN: 6.2500 mg | INTRAMUSCULAR | Status: DC | PRN

## 2014-07-08 MED ORDER — 0.9 % SODIUM CHLORIDE (POUR BTL) OPTIME
TOPICAL | Status: DC | PRN
  Administered 2014-07-08: 1000 mL

## 2014-07-08 MED ORDER — LABETALOL HCL 5 MG/ML IV SOLN
5.0000 mg | INTRAVENOUS | Status: DC | PRN
  Administered 2014-07-08: 5 mg via INTRAVENOUS

## 2014-07-08 MED ORDER — LIDOCAINE HCL (CARDIAC) 20 MG/ML IV SOLN
INTRAVENOUS | Status: AC
  Filled 2014-07-08: qty 5

## 2014-07-08 MED ORDER — HYDROMORPHONE HCL 1 MG/ML IJ SOLN
INTRAMUSCULAR | Status: DC | PRN
Start: 2014-07-08 — End: 2014-07-08
  Administered 2014-07-08: 0.5 mg via INTRAVENOUS

## 2014-07-08 MED ORDER — SODIUM CHLORIDE 0.9 % IV SOLN
Freq: Once | INTRAVENOUS | Status: DC
  Administered 2014-07-08: 11:00:00 via INTRAVENOUS

## 2014-07-08 SURGICAL SUPPLY — 37 items
BAG SPEC THK2 15X12 ZIP CLS (MISCELLANEOUS) ×1
BAG ZIPLOCK 12X15 (MISCELLANEOUS) ×2 IMPLANT
BANDAGE ELASTIC 6 VELCRO ST LF (GAUZE/BANDAGES/DRESSINGS) ×2 IMPLANT
BLADE SURG 15 STRL LF DISP TIS (BLADE) ×1 IMPLANT
BLADE SURG 15 STRL SS (BLADE) ×2
BNDG GAUZE ELAST 4 BULKY (GAUZE/BANDAGES/DRESSINGS) ×2 IMPLANT
DRAPE STERI IOBAN 125X83 (DRAPES) ×2 IMPLANT
DRSG AQUACEL AG ADV 3.5X 4 (GAUZE/BANDAGES/DRESSINGS) ×2 IMPLANT
DRSG PAD ABDOMINAL 8X10 ST (GAUZE/BANDAGES/DRESSINGS) ×2 IMPLANT
DRSG TEGADERM 4X4.75 (GAUZE/BANDAGES/DRESSINGS) ×2 IMPLANT
DURAPREP 26ML APPLICATOR (WOUND CARE) ×2 IMPLANT
ELECT REM PT RETURN 9FT ADLT (ELECTROSURGICAL) ×2
ELECTRODE REM PT RTRN 9FT ADLT (ELECTROSURGICAL) ×1 IMPLANT
GAUZE SPONGE 4X4 12PLY STRL (GAUZE/BANDAGES/DRESSINGS) ×2 IMPLANT
GLOVE SURG ORTHO 8.0 STRL STRW (GLOVE) ×2 IMPLANT
GOWN STRL REUS W/TWL LRG LVL3 (GOWN DISPOSABLE) ×2 IMPLANT
GUIDE PIN 3.2 LONG (PIN) ×1 IMPLANT
GUIDE PIN 3.2MM (MISCELLANEOUS) ×2
GUIDE PIN ORTH 343X3.2XBRAD (MISCELLANEOUS) IMPLANT
HIP SCREW SET (Screw) ×1 IMPLANT
KIT BASIN OR (CUSTOM PROCEDURE TRAY) ×2 IMPLANT
NAIL IMSH 10X36 (Nail) ×1 IMPLANT
PACK GENERAL/GYN (CUSTOM PROCEDURE TRAY) ×2 IMPLANT
PADDING CAST COTTON 6X4 STRL (CAST SUPPLIES) ×2 IMPLANT
POSITIONER SURGICAL ARM (MISCELLANEOUS) ×3 IMPLANT
SCREW COMPRESSION (Screw) ×1 IMPLANT
SCREW LAG 95MM (Screw) ×2 IMPLANT
SCREW LAGSTD 95X21X12.7X9 (Screw) IMPLANT
SOL PREP PROV IODINE SCRUB 4OZ (MISCELLANEOUS) ×1 IMPLANT
SPONGE LAP 4X18 X RAY DECT (DISPOSABLE) ×2 IMPLANT
STAPLER VISISTAT (STAPLE) ×2 IMPLANT
SUT ETHILON 3 0 PS 1 (SUTURE) ×3 IMPLANT
SUT VIC AB 0 CT1 27 (SUTURE) ×4
SUT VIC AB 0 CT1 27XBRD ANTBC (SUTURE) ×2 IMPLANT
SUT VIC AB 2-0 CT1 27 (SUTURE) ×4
SUT VIC AB 2-0 CT1 TAPERPNT 27 (SUTURE) ×2 IMPLANT
TOWEL OR 17X26 10 PK STRL BLUE (TOWEL DISPOSABLE) ×6 IMPLANT

## 2014-07-08 NOTE — ED Notes (Signed)
Per surgery, will see patient after lunch, will perform surgery at [facility]-notified EDP and RN,keep NPO per EDP

## 2014-07-08 NOTE — Anesthesia Postprocedure Evaluation (Signed)
Anesthesia Post Note  Patient: [name] Small  Procedure(s) Performed: Procedure(s) (LRB): INTRAMEDULLARY (IM) NAIL  (Left)  Anesthesia type: General  Patient location: PACU  Post pain: Pain level controlled  Post assessment: Post-op Vital signs reviewed  Last Vitals: BP 146/61 mmHg  Pulse 73  Temp(Src) 36.4 C (Oral)  Resp 18  SpO2 100%  Post vital signs: Reviewed  Level of consciousness: sedated  Complications: No apparent anesthesia complications

## 2014-07-08 NOTE — Transfer of Care (Signed)
Immediate Anesthesia Transfer of Care Note  Patient: [name] Small  Procedure(s) Performed: Procedure(s): INTRAMEDULLARY (IM) NAIL  (Left)  Patient Location: PACU  Anesthesia Type:General  Level of Consciousness: awake, alert  and oriented  Airway & Oxygen Therapy: Patient Spontanous Breathing and Patient connected to face mask oxygen  Post-op Assessment: Report given to RN and Post -op Vital signs reviewed and stable  Post vital signs: Reviewed and stable  Last Vitals:  Filed Vitals:   07/08/14 1646  BP: 173/56  Pulse: 81  Temp: 37.1 C  Resp:     Complications: No apparent anesthesia complications

## 2014-07-08 NOTE — ED Provider Notes (Signed)
CSN: [id number]     Arrival date & time 07/08/14  0630 History   First MD Initiated Contact with Patient 07/08/14 [id number]     Chief Complaint  Patient presents with  . Fall  . Hip Pain    left      HPI Patient presents to the emergency department after falling off the bottom of the stairs today while apparent for a trip.  She presents with severe left hip pain with left leg shortening and external rotation.  She received fentanyl and Zofran by EMS.  Her pain is controlled at this time.  She denies head injury.  No neck pain.  No loss consciousness.  She denies weakness of her arms or legs.  She reports severe pain in her left hip.  No other complaints.  She has no history of heart disease or lung disease.  She is not on anticoagulants.  Patient was otherwise in her normal state of health today.  She lives at home with her husband.   Past Medical History  Diagnosis Date  . DIVERTICULOSIS, COLON 11/22/2006  . HYPERLIPIDEMIA 11/22/2006  . OSTEOPOROSIS 11/22/2006  . Menopausal syndrome (hot flashes)   . Headache(784.0)   . Cancer Meloanoma left leg  . Hypertension    Past Surgical History  Procedure Laterality Date  . Appendectomy    . Cholecystectomy    . Bunionectomy     Family History  Problem Relation Age of Onset  . Alzheimer's disease Sister   . Cardiomyopathy Brother    History  Substance Use Topics  . Smoking status: Never Smoker   . Smokeless tobacco: Never Used  . Alcohol Use: No   OB History    No data available     Review of Systems  All other systems reviewed and are negative.     Allergies  Lisinopril  Home Medications   Prior to Admission medications   Medication Sig Start Date End Date Taking? Authorizing Provider  acetaminophen (TYLENOL) 500 MG tablet Take 500 mg by mouth every 6 (six) hours as needed. For pain.   Yes Historical Provider, MD  atorvastatin (LIPITOR) 40 MG tablet Take 1 tablet (40 mg total) by mouth daily. 04/09/14  Yes Marletta Lor, MD  Calcium Carbonate-Vitamin D (CALTRATE 600+D PO) Take 1 tablet by mouth daily.     Yes Historical Provider, MD  losartan (COZAAR) 100 MG tablet Take 1 tablet (100 mg total) by mouth daily. 04/09/14  Yes Marletta Lor, MD  Polyvinyl Alcohol-Povidone (REFRESH OP) Place 1 drop into both eyes 2 (two) times daily.   Yes Historical Provider, MD  ciprofloxacin (CIPRO) 500 MG tablet Take 1 tablet (500 mg total) by mouth 2 (two) times daily. Patient not taking: Reported on 07/08/2014 05/12/14   Laurey Morale, MD  methocarbamol (ROBAXIN) 500 MG tablet Take 1 tablet (500 mg total) by mouth 2 (two) times daily. Patient not taking: Reported on 05/05/2014 04/05/14   Heather Laisure, PA-C   BP 205/64 mmHg  Pulse 88  Temp(Src) 98 F (36.7 C) (Oral)  Resp 16  SpO2 99% Physical Exam  Constitutional: She is oriented to person, place, and time. She appears well-developed and well-nourished. No distress.  HENT:  Head: Normocephalic and atraumatic.  Eyes: EOM are normal.  Neck: Normal range of motion.  c spine is nontender  Cardiovascular: Normal rate, regular rhythm and normal heart sounds.   Pulmonary/Chest: Effort normal and breath sounds normal.  Abdominal: Soft. She exhibits no distension. There  is no tenderness.  Musculoskeletal: Normal range of motion.  Left leg shortened and rotated. Wiggles toes bilaterally. Normal pulses bilaterally  Neurological: She is alert and oriented to person, place, and time.  Skin: Skin is warm and dry.  Psychiatric: She has a normal mood and affect. Judgment normal.  Nursing note and vitals reviewed.   ED Course  Procedures (including critical care time) Labs Review Labs Reviewed  CBC WITH DIFFERENTIAL/PLATELET - Abnormal; Notable for the following:    WBC 12.5 (*)    Neutrophils Relative % 82 (*)    Neutro Abs 10.2 (*)    All other components within normal limits  PROTIME-INR  BASIC METABOLIC PANEL  TYPE AND SCREEN    Imaging  Review Dg Chest 1 View  07/08/2014   CLINICAL DATA:  Preoperative evaluation for hip fracture. Hypertension  EXAM: CHEST  1 VIEW  COMPARISON:  September 16, 2011  FINDINGS: There is no edema or consolidation. The heart size and pulmonary vascularity are normal. No adenopathy. No pneumothorax. No bone lesions.  IMPRESSION: No edema or consolidation.   Electronically Signed   By: Lowella Grip III M.D.   On: 07/08/2014 08:57   Dg Hip Unilat With Pelvis 2-3 Views Left  07/08/2014   CLINICAL DATA:  Fall with left hip pain. Initial encounter.  EXAM: LEFT HIP (WITH PELVIS) 2-3 VIEWS  COMPARISON:  None.  FINDINGS: Acute intertrochanteric left femur fracture with distraction and varus angulation. The lesser and greater trochanteric fragments have been distracted. The hip remains located.  No evidence of pelvic ring or right hip fracture. 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  Osteopenia.  IMPRESSION: Displaced intertrochanteric left femur fracture.   Electronically Signed   By: Monte Fantasia M.D.   On: 07/08/2014 08:56  I personally reviewed the imaging tests through PACS system I reviewed available ER/hospitalization records through the EMR    EKG Interpretation   Date/Time:  Wednesday July 08 2014 08:36:04 EST Ventricular Rate:  50 PR Interval:  160 QRS Duration: 105 QT Interval:  474 QTC Calculation: 432 R Axis:   -24 Text Interpretation:  Sinus rhythm Borderline left axis deviation Low  voltage, precordial leads Abnormal R-wave progression, early transition  Baseline wander in lead(s) V6 No significant change was found Confirmed by  Faylynn Stamos  MD, Kennady Zimmerle ([id number]) on 07/08/2014 9:05:55 AM      MDM   Final diagnoses:  Fall  Pain    Left intertrochanteric fracture.  Patient will be admitted to the hospital.  Pain controlled at this time.  Nothing by mouth.  No use of anticoagulants.     Orthopedics: Dr Ellwood Handler    Hoy Morn, MD 07/08/14 [id number]

## 2014-07-08 NOTE — H&P (Signed)
[facility] History and Physical  [name] Small FAO:[id number] DOB: 28-Sep-1928 DOA: 07/08/2014  Referring physician: ED physician PCP: [name] Cowden, MD   Chief Complaint: fall and left hip pain   HPI:  Patient is 81 year old female with hypertension, presented to [facility] emergency department after an episode of fall at home while she was walking down the stairs. Patient explains she tripped and fell onto her left hip. She denies any chest pain or shortness of breath at this time or prior to this event. In addition she denies any loss of consciousness, no numbness or tingling in lower extremities or upper extremities. She describes pain in the left hip is constant and throbbing, 7 out of 10 in severity, occasionally but not consistently radiating to the left lower extremity. Worse with movement and with no specific alleviating factors.  In emergency department vital signs stable except elevated blood pressure at 200/61. X-ray notable for left hip fracture. Orthopedic specialists consulted for further evaluation and tried hospitalist asked to admit.  Assessment and Plan: Active Problems: Displaced intertrochanteric left femur fracture - Admit to medical floor - Appreciate orthopedic team assistance - Provide analgesia as needed - Plan to take to OR this afternoon - Keep nothing by mouth - Plan for PT in the morning Hypertension, accelerated - Likely exacerbated by acute hip fracture and pain - Continue home medical regimen with losartan - Placed in hydralazine 10 mg every 6 hours as needed for systolic blood pressure greater than 165 Leukocytosis - No specific infectious etiology elicited, patient denies urinary concerns, chest x-ray with no signs of consolidation - This is possibly reactive from fall and hip fracture - Hold off on antibiotics at this time, repeat CBC in the morning DVT prophylaxis - Lovenox subcutaneous   Radiological Exams on Admission: Dg Chest  1 View  07/08/2014   CLINICAL DATA:  Preoperative evaluation for hip fracture. Hypertension  EXAM: CHEST  1 VIEW  COMPARISON:  September 16, 2011  FINDINGS: There is no edema or consolidation. The heart size and pulmonary vascularity are normal. No adenopathy. No pneumothorax. No bone lesions.  IMPRESSION: No edema or consolidation.   Electronically Signed   By: [name] Small M.D.   On: 07/08/2014 08:57   Dg Hip Unilat With Pelvis 2-3 Views Left  07/08/2014   CLINICAL DATA:  Fall with left hip pain. Initial encounter.  EXAM: LEFT HIP (WITH PELVIS) 2-3 VIEWS  COMPARISON:  None.  FINDINGS: Acute intertrochanteric left femur fracture with distraction and varus angulation. The lesser and greater trochanteric fragments have been distracted. The hip remains located.  No evidence of pelvic ring or right hip fracture. 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  Osteopenia.  IMPRESSION: Displaced intertrochanteric left femur fracture.   Electronically Signed   By: [name] Small M.D.   On: 07/08/2014 08:56     Code Status: Full Family Communication: Pt and family at bedside Disposition Plan: Admit for further evaluation    [name] Small [facility] [phone]   Review of Systems:  Constitutional: Negative for fever, chills and malaise/fatigue. Negative for diaphoresis.  HENT: Negative for hearing loss, ear pain, nosebleeds, congestion, sore throat, neck pain, tinnitus and ear discharge.   Eyes: Negative for blurred vision, double vision, photophobia, pain, discharge and redness.  Respiratory: Negative for cough, hemoptysis, sputum production, shortness of breath, wheezing and stridor.   Cardiovascular: Negative for chest pain, palpitations, orthopnea, claudication and leg swelling.  Gastrointestinal: Negative for nausea, vomiting and  abdominal pain. Negative for heartburn, constipation, blood in stool and melena.  Genitourinary: Negative for dysuria, urgency, frequency,  hematuria and flank pain.  Musculoskeletal: Negative for myalgias Skin: Negative for itching and rash.  Neurological: Negative for dizziness and weakness.  Endo/Heme/Allergies: Negative for environmental allergies and polydipsia. Does not bruise/bleed easily.  Psychiatric/Behavioral: Negative for suicidal ideas. The patient is not nervous/anxious.      Past Medical History  Diagnosis Date  . DIVERTICULOSIS, COLON 11/22/2006  . HYPERLIPIDEMIA 11/22/2006  . OSTEOPOROSIS 11/22/2006  . Menopausal syndrome (hot flashes)   . Headache(784.0)   . Cancer Meloanoma left leg  . Hypertension     Past Surgical History  Procedure Laterality Date  . Appendectomy    . Cholecystectomy    . Bunionectomy      Social History:  reports that she has never smoked. She has never used smokeless tobacco. She reports that she does not drink alcohol or use illicit drugs.  Allergies  Allergen Reactions  . Lisinopril     Cough    Family History  Problem Relation Age of Onset  . Alzheimer's disease Sister   . Cardiomyopathy Brother     Prior to Admission medications   Medication Sig Start Date End Date Taking? Authorizing Provider  acetaminophen (TYLENOL) 500 MG tablet Take 500 mg by mouth every 6 (six) hours as needed. For pain.   Yes Historical Provider, MD  atorvastatin (LIPITOR) 40 MG tablet Take 1 tablet (40 mg total) by mouth daily. 04/09/14  Yes [name] Lor, MD  Calcium Carbonate-Vitamin D (CALTRATE 600+D PO) Take 1 tablet by mouth daily.     Yes Historical Provider, MD  losartan (COZAAR) 100 MG tablet Take 1 tablet (100 mg total) by mouth daily. 04/09/14  Yes [name] Lor, MD  Polyvinyl Alcohol-Povidone (REFRESH OP) Place 1 drop into both eyes 2 (two) times daily.   Yes Historical Provider, MD  ciprofloxacin (CIPRO) 500 MG tablet Take 1 tablet (500 mg total) by mouth 2 (two) times daily. Patient not taking: Reported on 07/08/2014 05/12/14   [name] Morale, MD  methocarbamol (ROBAXIN)  500 MG tablet Take 1 tablet (500 mg total) by mouth 2 (two) times daily. Patient not taking: Reported on 05/05/2014 04/05/14   [name] Bible, PA-C    Physical Exam: Filed Vitals:   07/08/14 0759 07/08/14 0923  BP: 205/64 191/50  Pulse: 88 56  Temp: 98 F (36.7 C)   TempSrc: Oral   Resp: 16 17  SpO2: 99% 100%    Physical Exam  Constitutional: Appears well-developed and well-nourished. No distress.  HENT: Normocephalic. External right and left ear normal. Oropharynx is clear and moist.  Eyes: Conjunctivae and EOM are normal. PERRLA, no scleral icterus.  Neck: Normal ROM. Neck supple. No JVD. No tracheal deviation. No thyromegaly.  CVS: RRR, S1/S2 +, no murmurs, no gallops, no carotid bruit.  Pulmonary: Effort and breath sounds normal, no stridor, rhonchi, wheezes, rales.  Abdominal: Soft. BS +,  no distension, tenderness, rebound or guarding.  Musculoskeletal: Normal range of motion. Significant tenderness at the area of the left hip, limited range of motion due to pain  Lymphadenopathy: No lymphadenopathy noted, cervical, inguinal. Neuro: Alert. Normal reflexes, muscle tone coordination. No cranial nerve deficit. Skin: Skin is warm and dry. No rash noted. Not diaphoretic. No erythema. No pallor.  Psychiatric: Normal mood and affect. Behavior, judgment, thought content normal.   Labs on Admission:  Basic Metabolic Panel: No results for input(s): NA, K, CL, CO2,  GLUCOSE, BUN, CREATININE, CALCIUM, MG, PHOS in the last 168 hours. Liver Function Tests: No results for input(s): AST, ALT, ALKPHOS, BILITOT, PROT, ALBUMIN in the last 168 hours. No results for input(s): LIPASE, AMYLASE in the last 168 hours. No results for input(s): AMMONIA in the last 168 hours. CBC:  Recent Labs Lab 07/08/14 0852  WBC 12.5*  NEUTROABS 10.2*  HGB 12.9  HCT 40.0  MCV 91.7  PLT 196    EKG: Requested EKG preop evaluation, still pending   If 7PM-7AM, please contact  night-coverage [email or web address] Password [facility] 07/08/2014, 9:27 AM

## 2014-07-08 NOTE — ED Notes (Signed)
Patient transported to X-ray 

## 2014-07-08 NOTE — Brief Op Note (Signed)
07/08/2014  7:07 PM  PATIENT:  [name] Small  81 y.o. female  PRE-OPERATIVE DIAGNOSIS:  fractured left hip  POST-OPERATIVE DIAGNOSIS:  fractured left hip  PROCEDURE:  Procedure(s): INTRAMEDULLARY (IM) NAIL   SURGEON:  Surgeon(s): Meredith Pel, MD  ASSISTANT: none  ANESTHESIA:   epidural  EBL: 100 ml       BLOOD ADMINISTERED: none  DRAINS: none   LOCAL MEDICATIONS USED:  none  SPECIMEN:  No Specimen  COUNTS:  YES  TOURNIQUET:  * No tourniquets in log *  DICTATION: .Other Dictation: Dictation Number [id number]   PLAN OF CARE: Admit to inpatient   PATIENT DISPOSITION:  PACU - hemodynamically stable

## 2014-07-08 NOTE — ED Notes (Addendum)
Per EMS pt comes from home where pt was on last step in garage carrying her suitcase when she fell onto left side.  Pt called her son who came over and called 36.  Pt has shortening and rotation per EMS.  EMS has splint around hips. Pt states that pain is very intense when she moves.   Pt was given 127mcg Fentanyl and 8mg  Zofran in route. Vitals: 192/100, 60HR, 94% RA.  Pt hasnt taken her morning medications (ie HTN) due to not taking on empty stomach.

## 2014-07-08 NOTE — Anesthesia Preprocedure Evaluation (Addendum)
Anesthesia Evaluation  Patient identified by MRN, date of birth, ID band Patient awake    Reviewed: Allergy & Precautions, NPO status , Patient's Chart, lab work & pertinent test results  Airway Mallampati: II  TM Distance: >3 FB Neck ROM: Full    Dental no notable dental hx.    Pulmonary neg pulmonary ROS,  breath sounds clear to auscultation  Pulmonary exam normal       Cardiovascular hypertension, Pt. on medications Rhythm:Regular Rate:Normal  Echo 2013 - Left ventricle: The cavity size was normal. Wall thicknesswas increased in a pattern of mild LVH. Systolic functionwas vigorous. The estimated ejection fraction was in therange of 65% to 70%. Wall motion was normal; there were noregional wall motion abnormalities. Features areconsistent with a pseudonormal left ventricular fillingpattern, with concomitant abnormal relaxation andincreased filling pressure (grade 2 diastolicdysfunction). - Aortic valve: Trivial regurgitation. - Mitral valve: Calcified annulus. - Pericardium, extracardiac: A trivial pericardial effusion was identified.    Neuro/Psych negative neurological ROS  negative psych ROS   GI/Hepatic negative GI ROS, Neg liver ROS,   Endo/Other  negative endocrine ROS  Renal/GU negative Renal ROS     Musculoskeletal negative musculoskeletal ROS (+)   Abdominal   Peds  Hematology negative hematology ROS (+)   Anesthesia Other Findings   Reproductive/Obstetrics negative OB ROS                            Anesthesia Physical Anesthesia Plan  ASA: III  Anesthesia Plan:    Post-op Pain Management:    Induction:   Airway Management Planned:   Additional Equipment:   Intra-op Plan:   Post-operative Plan:   Informed Consent: I have reviewed the patients History and Physical, chart, labs and discussed the procedure including the risks, benefits and alternatives  for the proposed anesthesia with the patient or authorized representative who has indicated his/her understanding and acceptance.   Dental advisory given  Plan Discussed with: CRNA  Anesthesia Plan Comments:         Anesthesia Quick Evaluation

## 2014-07-08 NOTE — ED Notes (Signed)
Bed: XQ11 Expected date:  Expected time:  Means of arrival:  Comments: Ems, 81 yo F, hip pain

## 2014-07-08 NOTE — Consult Note (Signed)
Reason for Consult: Left hip pain Referring Physician: Dr. Willene Hatchet is an 81 y.o. female.  HPI: [name] Small present a 30-year-old female who is getting ready to go on a trip today when she fell mechanical fall at her house. Reports left hip pain no other injuries denies any orthopedic complaints denies loss of consciousness there  is no family history of DVT or pulmonary embolism  Past Medical History  Diagnosis Date  . DIVERTICULOSIS, COLON 11/22/2006  . HYPERLIPIDEMIA 11/22/2006  . OSTEOPOROSIS 11/22/2006  . Menopausal syndrome (hot flashes)   . Headache(784.0)   . Cancer Meloanoma left leg  . Hypertension     Past Surgical History  Procedure Laterality Date  . Appendectomy    . Cholecystectomy    . Bunionectomy      Family History  Problem Relation Age of Onset  . Alzheimer's disease Sister   . Cardiomyopathy Brother     Social History:  reports that she has never smoked. She has never used smokeless tobacco. She reports that she does not drink alcohol or use illicit drugs.  Allergies:  Allergies  Allergen Reactions  . Lisinopril     Cough    Medications: I have reviewed the patient's current medications.  Results for orders placed or performed during the hospital encounter of 07/08/14 (from the past 48 hour(s))  Type and screen     Status: None   Collection Time: 07/08/14  8:48 AM  Result Value Ref Range   ABO/RH(D) O POS    Antibody Screen NEG    Sample Expiration 59/16/3846   Basic metabolic panel     Status: Abnormal   Collection Time: 07/08/14  8:52 AM  Result Value Ref Range   Sodium 139 135 - 145 mmol/L   Potassium 4.0 3.5 - 5.1 mmol/L   Chloride 108 96 - 112 mmol/L   CO2 24 19 - 32 mmol/L   Glucose, Bld 187 (H) 70 - 99 mg/dL   BUN 13 6 - 23 mg/dL   Creatinine, Ser 1.06 0.50 - 1.10 mg/dL   Calcium 8.9 8.4 - 10.5 mg/dL   GFR calc non Af Amer 47 (L) >90 mL/min   GFR calc Af Amer 55 (L) >90 mL/min    Comment: (NOTE) The eGFR has been calculated using  the CKD EPI equation. This calculation has not been validated in all clinical situations. eGFR's persistently <90 mL/min signify possible Chronic Kidney Disease.    Anion gap 7 5 - 15  CBC WITH DIFFERENTIAL     Status: Abnormal   Collection Time: 07/08/14  8:52 AM  Result Value Ref Range   WBC 12.5 (H) 4.0 - 10.5 K/uL   RBC 4.36 3.87 - 5.11 MIL/uL   Hemoglobin 12.9 12.0 - 15.0 g/dL   HCT 40.0 36.0 - 46.0 %   MCV 91.7 78.0 - 100.0 fL   MCH 29.6 26.0 - 34.0 pg   MCHC 32.3 30.0 - 36.0 g/dL   RDW 13.3 11.5 - 15.5 %   Platelets 196 150 - 400 K/uL   Neutrophils Relative % 82 (H) 43 - 77 %   Neutro Abs 10.2 (H) 1.7 - 7.7 K/uL   Lymphocytes Relative 13 12 - 46 %   Lymphs Abs 1.7 0.7 - 4.0 K/uL   Monocytes Relative 4 3 - 12 %   Monocytes Absolute 0.5 0.1 - 1.0 K/uL   Eosinophils Relative 1 0 - 5 %   Eosinophils Absolute 0.1 0.0 - 0.7 K/uL  Basophils Relative 0 0 - 1 %   Basophils Absolute 0.0 0.0 - 0.1 K/uL  Protime-INR     Status: None   Collection Time: 07/08/14  8:52 AM  Result Value Ref Range   Prothrombin Time 13.7 11.6 - 15.2 seconds   INR 1.04 0.00 - 1.49    Dg Chest 1 View  07/08/2014   CLINICAL DATA:  Preoperative evaluation for hip fracture. Hypertension  EXAM: CHEST  1 VIEW  COMPARISON:  September 16, 2011  FINDINGS: There is no edema or consolidation. The heart size and pulmonary vascularity are normal. No adenopathy. No pneumothorax. No bone lesions.  IMPRESSION: No edema or consolidation.   Electronically Signed   By: Lowella Grip III M.D.   On: 07/08/2014 08:57   Dg Hip Unilat With Pelvis 2-3 Views Left  07/08/2014   CLINICAL DATA:  Fall with left hip pain. Initial encounter.  EXAM: LEFT HIP (WITH PELVIS) 2-3 VIEWS  COMPARISON:  None.  FINDINGS: Acute intertrochanteric left femur fracture with distraction and varus angulation. The lesser and greater trochanteric fragments have been distracted. The hip remains located.  No evidence of pelvic ring or right hip fracture.  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  Osteopenia.  IMPRESSION: Displaced intertrochanteric left femur fracture.   Electronically Signed   By: Monte Fantasia M.D.   On: 07/08/2014 08:56    Review of Systems  Constitutional: Negative.   HENT: Negative.   Eyes: Negative.   Respiratory: Negative.   Cardiovascular: Negative.   Gastrointestinal: Negative.   Genitourinary: Negative.   Musculoskeletal: Positive for joint pain.  Skin: Negative.   Neurological: Negative.   Endo/Heme/Allergies: Negative.   Psychiatric/Behavioral: Negative.    Blood pressure 200/61, pulse 56, temperature 98 F (36.7 C), temperature source Oral, resp. rate 18, SpO2 100 %. Physical Exam  Constitutional: She appears well-developed.  HENT:  Head: Normocephalic.  Eyes: Pupils are equal, round, and reactive to light.  Neck: Normal range of motion.  Cardiovascular: Normal rate.   Respiratory: Effort normal.  Neurological: She is alert.  Skin: Skin is warm.  Psychiatric: She has a normal mood and affect.   semination of bilateral upper chimney demonstrates elbow shoulders wrist with reasonable range of motion and crepitus motor sensory function intact in the hands right lower [name] he demonstrates no grinding crepitus in the right ankle or right knee no groin tenderness internal extra rotation leg she has good ankle dorsiflexion plantar flexion strength on the right-hand side she has palpable pedal pulses bilaterally intact ankle [name] Small flexion on the left she has no knee effusion left pain with left hip range of motion  Assessment/Plan: Impression is left hip intertrochanteric fracture plan open reduction internal fixation today risk benefits discussed with the patient cannot limited to infection nerve and vessel damage incomplete healing potential need for more surgery anticipate hospital stay of 1-2 days with discharged either home with home health or potentially skilled  nursing. She does have 2 sons that live near her. She has 4 steps to get into the house. She was a community and related with no assist prior to this fall.Marcene Duos SCOTT 07/08/2014, 12:37 PM

## 2014-07-08 NOTE — Progress Notes (Signed)
UR completed 

## 2014-07-08 NOTE — Progress Notes (Signed)
Patients BP on arrival to [place] is 200/61.   RN paged hospitalist about BP, and informed MD that BP has been elevated since patient was in ED.  (ED RN was asked by 6E RN if BP has been addressed. ED RN, Morey Hummingbird informed floor RN that nothing had been done about BP in the ED.)

## 2014-07-08 NOTE — Progress Notes (Signed)
INITIAL NUTRITION ASSESSMENT  DOCUMENTATION CODES Per approved criteria  -Not Applicable   INTERVENTION: - Recommend that updated weight be obtained to better assess nutritional needs. - Once diet advanced, add Ensure Complete po BID, each supplement provides 350 kcal and 13 grams of protein - RD will continue to monitor for diet advancement  NUTRITION DIAGNOSIS: Inadequate oral intake related to inability to eat as evidenced by NPO.   Goal: Pt to meet >/= 90% of their estimated nutrition needs   Monitor:  Weight trend, NPO, acceptance of supplements, labs  Reason for Assessment: Consult for nutrition assessment  81 y.o. female  Admitting Dx: <principal problem not specified>  ASSESSMENT: 81 year old female with hypertension, presented to [facility] emergency department after an episode of fall at home while she was walking down the stairs. Patient explains she tripped and fell onto her left hip.   Pt with displaced intertrochanteric left femur fracture.  Currently NPO and surgery scheduled for this afternoon.  Pt report no recent weight loss. Usual body weight is 140-145 lbs.  Pt with poor appetite.  No updated weight in chart.  Will order nutritional supplements once diet advanced.  Pt with mild wasting at the temples.  Labs reviewed.   Height: Ht Readings from Last 1 Encounters:  05/05/14 5' 4.5" (1.638 m)    Weight: Wt Readings from Last 1 Encounters:  05/05/14 146 lb (66.225 kg)    Ideal Body Weight: 120 lbs  % Ideal Body Weight: 122%  Wt Readings from Last 10 Encounters:  05/05/14 146 lb (66.225 kg)  04/07/14 144 lb (65.318 kg)  04/05/14 140 lb (63.504 kg)  10/21/13 146 lb (66.225 kg)  10/02/13 145 lb (65.772 kg)  04/03/13 143 lb (64.864 kg)  09/12/12 145 lb (65.772 kg)  06/04/12 150 lb (68.04 kg)  04/02/12 149 lb (67.586 kg)  09/21/11 152 lb (68.947 kg)    BMI:  There is no weight on file to calculate BMI.  Estimated Nutritional Needs: Kcal:  1800-2000 Protein: 90-100 g Fluid: 1.8-2.0 L/day *based on weight from December 2015  Skin: intact  Diet Order: Diet NPO time specified  EDUCATION NEEDS: -Education needs addressed   Intake/Output Summary (Last 24 hours) at 07/08/14 1201 Last data filed at 07/08/14 1124  Gross per 24 hour  Intake  48.75 ml  Output      0 ml  Net  48.75 ml    Last BM: prior to admission   Labs:   Recent Labs Lab 07/08/14 0852  NA 139  K 4.0  CL 108  CO2 24  BUN 13  CREATININE 1.06  CALCIUM 8.9  GLUCOSE 187*    CBG (last 3)  No results for input(s): GLUCAP in the last 72 hours.  Scheduled Meds: . atorvastatin  40 mg Oral Daily  . enoxaparin (LOVENOX) injection  30 mg Subcutaneous Q24H  . lip balm      . losartan  100 mg Oral Daily    Continuous Infusions: . sodium chloride 10 mL/hr at 07/08/14 1124    Past Medical History  Diagnosis Date  . DIVERTICULOSIS, COLON 11/22/2006  . HYPERLIPIDEMIA 11/22/2006  . OSTEOPOROSIS 11/22/2006  . Menopausal syndrome (hot flashes)   . Headache(784.0)   . Cancer Meloanoma left leg  . Hypertension     Past Surgical History  Procedure Laterality Date  . Appendectomy    . Cholecystectomy    . [place], [place], [place]

## 2014-07-09 ENCOUNTER — Encounter (HOSPITAL_COMMUNITY): Payer: Self-pay | Admitting: Orthopedic Surgery

## 2014-07-09 LAB — BASIC METABOLIC PANEL
Anion gap: 11 (ref 5–15)
BUN: 14 mg/dL (ref 6–23)
CHLORIDE: 105 mmol/L (ref 96–112)
CO2: 23 mmol/L (ref 19–32)
CREATININE: 0.92 mg/dL (ref 0.50–1.10)
Calcium: 8.7 mg/dL (ref 8.4–10.5)
GFR, EST AFRICAN AMERICAN: 65 mL/min — AB (ref >90)
GFR, EST NON AFRICAN AMERICAN: 56 mL/min — AB (ref >90)
Glucose, Bld: 170 mg/dL — ABNORMAL HIGH (ref 70–99)
Potassium: 4.2 mmol/L (ref 3.5–5.1)
Sodium: 139 mmol/L (ref 135–145)

## 2014-07-09 LAB — PROTIME-INR
INR: 1.11 (ref 0.00–1.49)
Prothrombin Time: 14.4 seconds (ref 11.6–15.2)

## 2014-07-09 LAB — CBC
HCT: 29.1 % — ABNORMAL LOW (ref 36.0–46.0)
HEMOGLOBIN: 9.6 g/dL — AB (ref 12.0–15.0)
MCH: 29.4 pg (ref 26.0–34.0)
MCHC: 33 g/dL (ref 30.0–36.0)
MCV: 89 fL (ref 78.0–100.0)
Platelets: 197 10*3/uL (ref 150–400)
RBC: 3.27 MIL/uL — ABNORMAL LOW (ref 3.87–5.11)
RDW: 13.6 % (ref 11.5–15.5)
WBC: 8.8 10*3/uL (ref 4.0–10.5)

## 2014-07-09 MED ORDER — WARFARIN SODIUM 3 MG PO TABS
3.0000 mg | ORAL_TABLET | Freq: Once | ORAL | Status: AC
  Administered 2014-07-09: 3 mg via ORAL
  Filled 2014-07-09: qty 1

## 2014-07-09 MED ORDER — ENSURE COMPLETE PO LIQD
237.0000 mL | Freq: Two times a day (BID) | ORAL | Status: DC
  Administered 2014-07-09 – 2014-07-10 (×3): 237 mL via ORAL

## 2014-07-09 MED ORDER — POLYETHYLENE GLYCOL 3350 17 G PO PACK
17.0000 g | PACK | Freq: Every day | ORAL | Status: DC
  Administered 2014-07-09 – 2014-07-11 (×3): 17 g via ORAL

## 2014-07-09 NOTE — Progress Notes (Signed)
ANTICOAGULATION CONSULT NOTE - follow up  Pharmacy Consult for warfarin Indication: VTE prophylaxis  Allergies  Allergen Reactions  . Lisinopril     Cough    Patient Measurements: Height: 5\' 5"  (165.1 cm) Weight: 152 lb (68.947 kg) IBW/kg (Calculated) : 57 Heparin Dosing Weight:   Vital Signs: Temp: 98.8 F (37.1 C) (02/18 0900) Temp Source: Oral (02/18 0900) BP: 142/47 mmHg (02/18 0930) Pulse Rate: 82 (02/18 0930)  Labs:  Recent Labs  07/08/14 0852 07/09/14 0525  HGB 12.9 9.6*  HCT 40.0 29.1*  PLT 196 197  LABPROT 13.7 14.4  INR 1.04 1.11  CREATININE 1.06 0.92    Estimated Creatinine Clearance: 45.2 mL/min (by C-G formula based on Cr of 0.92).   Medical History: Past Medical History  Diagnosis Date  . DIVERTICULOSIS, COLON 11/22/2006  . HYPERLIPIDEMIA 11/22/2006  . OSTEOPOROSIS 11/22/2006  . Menopausal syndrome (hot flashes)   . Headache(784.0)   . Cancer Meloanoma left leg  . Hypertension     Medications:  Prescriptions prior to admission  Medication Sig Dispense Refill Last Dose  . acetaminophen (TYLENOL) 500 MG tablet Take 500 mg by mouth every 6 (six) hours as needed. For pain.   a week  . atorvastatin (LIPITOR) 40 MG tablet Take 1 tablet (40 mg total) by mouth daily. 90 tablet 3 07/07/2014 at Unknown time  . Calcium Carbonate-Vitamin D (CALTRATE 600+D PO) Take 1 tablet by mouth daily.     a week  . losartan (COZAAR) 100 MG tablet Take 1 tablet (100 mg total) by mouth daily. 90 tablet 3 07/07/2014 at Unknown time  . Polyvinyl Alcohol-Povidone (REFRESH OP) Place 1 drop into both eyes 2 (two) times daily.   07/08/2014 at Unknown time  . ciprofloxacin (CIPRO) 500 MG tablet Take 1 tablet (500 mg total) by mouth 2 (two) times daily. (Patient not taking: Reported on 07/08/2014) 14 tablet 0   . methocarbamol (ROBAXIN) 500 MG tablet Take 1 tablet (500 mg total) by mouth 2 (two) times daily. (Patient not taking: Reported on 05/05/2014) 20 tablet 0 Not Taking    Scheduled:  . atorvastatin  40 mg Oral Daily  . feeding supplement (ENSURE COMPLETE)  237 mL Oral BID BM  . losartan  100 mg Oral Daily  . polyethylene glycol  17 g Oral Daily  . Warfarin - Pharmacist Dosing Inpatient   Does not apply q1800    Assessment: Patient s/p Left hip intertrochanteric fracture fixation.  Pharmacy to dose warfarin.  Today: INR 1.11 H/H low but as expected after surgery Plts WNL Regular diet starting today No drug interactions noted  Goal of Therapy:  INR 2-3   Plan:  Coumadin 3mg  today @ 1800 Check PT/INR daily. Provide Coumadin education.   Dolly Rias RPh 07/09/2014, 10:45 AM Pager [phone]

## 2014-07-09 NOTE — Care Management Note (Unsigned)
Page 1 of 2   07/09/2014     3:06:11 PM CARE MANAGEMENT NOTE 07/09/2014  Patient:  [name] Small,[name] Small   Account Number:  [id number]  Date Initiated:  07/09/2014  Documentation initiated by:  JEFFRIES,SARAH  Subjective/Objective Assessment:   adm: INTRAMEDULLARY (IM) NAIL  (Left)     Action/Plan:   discharge planning   Anticipated DC Date:  07/09/2014   Anticipated DC Plan:  HOME W HOME HEALTH SERVICES      DC Planning Services  CM consult      PAC Choice  HOME HEALTH   Choice offered to / List presented to:  C-1 Patient   DME arranged  3-N-1  WALKER - ROLLING      DME agency  [facility]     HH arranged  HH-2 PT      HH agency  [facility]   Status of service:  In process, will continue to follow Medicare Important Message given?   (If response is "NO", the following Medicare IM given date fields will be blank) Date Medicare IM given:   Medicare IM given by:   Date Additional Medicare IM given:   Additional Medicare IM given by:    Discharge Disposition:  HOME W HOME HEALTH SERVICES  Per UR Regulation:    If discussed at Long Length of Stay Meetings, dates discussed:    Comments:  07/09/14 10:00 CM met with pt in room to offer choice of home health agency. Despite recc. of SNF, pt is adamant in going home.  Pt chooses [facility] to render HHPT.  Address and contact information verified by pt.  Referral called to [facility] rep, Kristen.  CM called [facility] DME rep, Lecretia to please deliver the 3n1 and rolling walker to room prior to discharge. Order for 3n1, rolling walker, and HHPT/RN (INR monitoring) needed if plan continues to be to go home. Orders have been rquested.    Sarah Jeffries, BSN, CM [phone].   

## 2014-07-09 NOTE — Progress Notes (Signed)
Subjective: Pt stable - pain ok   Objective: Vital signs in last 24 hours: Temp:  [97.5 F (36.4 C)-98.8 F (37.1 C)] 98.8 F (37.1 C) (02/18 0900) Pulse Rate:  [66-119] 82 (02/18 0930) Resp:  [9-18] 17 (02/18 0930) BP: (131-192)/(43-90) 142/47 mmHg (02/18 0930) SpO2:  [99 %-100 %] 100 % (02/18 0930) Weight:  [68.947 kg (152 lb)] 68.947 kg (152 lb) (02/17 2310)  Intake/Output from previous day: 02/17 0701 - 02/18 0700 In: 1281.3 [P.O.:120; I.V.:1161.3] Out: 600 [Urine:500; Blood:100] Intake/Output this shift: Total I/O In: 120 [P.O.:120] Out: 300 [Urine:300]  Exam:  Sensation intact distally Intact pulses distally Dorsiflexion/Plantar flexion intact  Labs:  Recent Labs  07/08/14 0852 07/09/14 0525  HGB 12.9 9.6*    Recent Labs  07/08/14 0852 07/09/14 0525  WBC 12.5* 8.8  RBC 4.36 3.27*  HCT 40.0 29.1*  PLT 196 197    Recent Labs  07/08/14 0852 07/09/14 0525  NA 139 139  K 4.0 4.2  CL 108 105  CO2 24 23  BUN 13 14  CREATININE 1.06 0.92  GLUCOSE 187* 170*  CALCIUM 8.9 8.7    Recent Labs  07/08/14 0852 07/09/14 0525  INR 1.04 1.11    Assessment/Plan: Plan to mobilize with PT  - nwb for 2 weeks then progress   [name] Small 07/09/2014, 12:12 PM

## 2014-07-09 NOTE — Evaluation (Signed)
Physical Therapy Evaluation Patient Details Name: [name] Small MRN: [id number] DOB: 08-19-1928 Today's Date: 07/09/2014   History of Present Illness  81 y/o female who fell  and broke L hip while on stairs at home .  Pt is now s/p L femoral IM nailing (07/08/14)  Clinical Impression  Pt admitted with above diagnosis. Pt currently with functional limitations due to the deficits listed below (see PT Problem List).  Pt will benefit from skilled PT to increase their independence and safety with mobility to allow discharge to the venue listed below.   Pt would like to go home, but due to being NWB is needing +2 A with SPT.  At this time recommend SNF for more rehab due to L LE NWB.  Pt would really like to go home and is going to speak with family.  If family can provide necessary level of physical assistance, then pt may be ok to go home with their assistance, HHPT, and would need w/c as well as RW and BSC.  PT spoke with CM regarding this.     Follow Up Recommendations SNF    Equipment Recommendations  Rolling walker with 5" wheels    Recommendations for Other Services       Precautions / Restrictions Precautions Precautions: Fall Restrictions Weight Bearing Restrictions: Yes LLE Weight Bearing: Non weight bearing      Mobility  Bed Mobility Overal bed mobility: Needs Assistance;+2 for physical assistance Bed Mobility: Supine to Sit     Supine to sit: +2 for physical assistance;Min assist     General bed mobility comments: A at leg and one person A with trunk.  Pt with good effort.  Transfers Overall transfer level: Needs assistance Equipment used: Rolling walker (2 wheeled) Transfers: Sit to/from [facility] Sit to Stand: Mod assist Stand pivot transfers: +2 physical assistance;Mod assist       General transfer comment: cues for hand placement and to maintaing L NWB. Pt unable to hop on R leg for SPT and having to scoot R foot over with RW and with  posterior lean. Cues to bring RW with her and to stay within RW.  Ambulation/Gait                Stairs            Wheelchair Mobility    Modified Rankin (Stroke Patients Only)       Balance Overall balance assessment: Needs assistance Sitting-balance support: Feet supported;Bilateral upper extremity supported Sitting balance-Leahy Scale: Fair     Standing balance support: Bilateral upper extremity supported Standing balance-Leahy Scale: Poor                               Pertinent Vitals/Pain Pain Assessment: 0-10 Pain Score: 2 - did increase to 10/10 with transfer and then settled back down Pain Location: L thigh Pain Descriptors / Indicators: Sore Pain Intervention(s): Premedicated before session;RN gave pain meds during session, ice applied    Home Living Family/patient expects to be discharged to:: Private residence Living Arrangements: Alone   Type of Home: House Home Access: Stairs to enter Entrance Stairs-Rails: Left Entrance Stairs-Number of Steps: 4 Home Layout: One level Home Equipment: Cane - single point      Prior Function Level of Independence: Independent               Hand Dominance        Extremity/Trunk Assessment  Upper Extremity Assessment: Defer to OT evaluation           Lower Extremity Assessment: LLE deficits/detail   LLE Deficits / Details: Limited L LE ROM. Pt with tendency to keep IR.  Cervical / Trunk Assessment: Normal  Communication      Cognition Arousal/Alertness: Awake/alert Behavior During Therapy: WFL for tasks assessed/performed Overall Cognitive Status: Within Functional Limits for tasks assessed                      General Comments      Exercises General Exercises - Lower Extremity Ankle Circles/Pumps: AROM;Both;10 reps Heel Slides: AAROM;Left;10 reps;Supine Hip ABduction/ADduction: AAROM;Left;10 reps;Supine      Assessment/Plan    PT Assessment Patient  needs continued PT services  PT Diagnosis Difficulty walking;Acute pain;Generalized weakness   PT Problem List Decreased strength;Decreased range of motion;Decreased activity tolerance;Decreased balance;Decreased mobility;Pain  PT Treatment Interventions Gait training;Stair training;Functional mobility training;Therapeutic activities;Therapeutic exercise;DME instruction   PT Goals (Current goals can be found in the Care Plan section) Acute Rehab PT Goals Patient Stated Goal: Pt would like to go home PT Goal Formulation: With patient Time For Goal Achievement: 07/16/14 Potential to Achieve Goals: Good    Frequency Min 5X/week   Barriers to discharge        Co-evaluation               End of Session Equipment Utilized During Treatment: Gait belt Activity Tolerance: Patient tolerated treatment well Patient left: in chair;with call bell/phone within reach Nurse Communication: Mobility status;Weight bearing status         Time: 1020-1040 PT Time Calculation (min) (ACUTE ONLY): 20 min   Charges:   PT Evaluation $Initial PT Evaluation Tier I: 1 Procedure     PT G Codes:        Cherrell Maybee LUBECK 07/09/2014, 11:57 AM

## 2014-07-09 NOTE — Op Note (Signed)
NAMEANTARA, [name] Small                   ACCOUNT NO.:  [id number]  MEDICAL RECORD NO.:  [id number]  LOCATION:  2080                         FACILITY:  [facility]  PHYSICIAN:  Anderson Malta, M.D.    DATE OF BIRTH:  10/05/1928  DATE OF PROCEDURE:  07/08/2014 DATE OF DISCHARGE:                              OPERATIVE REPORT   PREOPERATIVE DIAGNOSIS:  Left hip intertrochanteric fracture.  POSTOPERATIVE DIAGNOSIS:  Left hip intertrochanteric fracture.  PROCEDURE:  Left hip intertrochanteric fracture fixation using [employer] IMHS 10 x 36 nail with 95 mm lag screw.  SURGEON:  Anderson Malta, M.D.  ASSISTANT:  None.  ANESTHESIA:  General.  INDICATIONS:  [name] is a patient who is an ambulatory patient who fell and injured her left hip.  She presents now for operative management after explanation risks and benefits.  PROCEDURE IN DETAIL:  The patient was brought to operating room where general anesthetic was induced.  Preoperative antibiotics administered. Time-out was called.  Right leg was placed in lithotomy position.  Left leg placed in the [facility] bed under traction and slight internal rotation. Near anatomic reduction was achieved under fluoroscopic evaluation.  At this time, left hip area was prescrubbed with alcohol and Betadine and allowed to air dry.  Prepped with DuraPrep solution, draped in sterile manner.  [name] was then used to cover the operative field.  Time-out was called.  An incision was made proximal to the trochanter.  Skin and subcutaneous tissue were sharply divided, guidepin was placed to the tip of the trochanter in good position into the shaft.  At this time, proximal reaming performed in accordance with preop templating.  A 10 x 36 nail was then placed across the fracture site and into the femoral shaft.  A separate incision was then made for the lag screw which across the fracture plane, tip apex distance approximately 5 mm.  At this time, traction was released and  a compression screw was placed to the lag screw.  Good reduction was achieved.  Thorough irrigation performed of both incisions which were then closed using 0 Vicryl suture, 2-0 Vicryl suture, and skin staples.  The patient tolerated the procedure well without immediate complication.  Transferred to recovery room in stable condition.  Total blood loss approximately 100 mL.    Anderson Malta, M.D.    GSD/MEDQ  D:  07/08/2014  T:  07/09/2014  Job:  [id number]

## 2014-07-09 NOTE — Progress Notes (Addendum)
CSW consulted for SNF placement. Met with pt / son and daughter in-law at bedside. Pt plans to return home following hospital d/c. Pt reports that she has good family support / 24/7 assistance. RNCM will assist with d/c planning.  Werner Lean LCSW [phone]

## 2014-07-09 NOTE — Progress Notes (Signed)
OT Cancellation Note  Patient Details Name: JHANIYA BRISKI MRN: [id number] DOB: 09-27-1928   Cancelled Treatment:    Reason Eval/Treat Not Completed: Pain limiting ability to participate;Fatigue/lethargy limiting ability to participate  Noted pt needed significant A with PT. Pt will likely need ST SNF.   Will recheck on pt next day  [place], Thereasa Parkin 07/09/2014, 12:14 PM

## 2014-07-09 NOTE — Progress Notes (Signed)
ANTICOAGULATION CONSULT NOTE - Initial Consult  Pharmacy Consult for warfarin Indication: VTE prophylaxis  Allergies  Allergen Reactions  . Lisinopril     Cough    Patient Measurements: Height: 5\' 5"  (165.1 cm) Weight: 152 lb (68.947 kg) IBW/kg (Calculated) : 57 Heparin Dosing Weight:   Vital Signs: Temp: 98.8 F (37.1 C) (02/18 0219) Temp Source: Axillary (02/18 0219) BP: 132/60 mmHg (02/18 0219) Pulse Rate: 80 (02/18 0219)  Labs:  Recent Labs  07/08/14 0852 07/09/14 0525  HGB 12.9 9.6*  HCT 40.0 29.1*  PLT 196 197  LABPROT 13.7 14.4  INR 1.04 1.11  CREATININE 1.06  --     Estimated Creatinine Clearance: 39.2 mL/min (by C-G formula based on Cr of 1.06).   Medical History: Past Medical History  Diagnosis Date  . DIVERTICULOSIS, COLON 11/22/2006  . HYPERLIPIDEMIA 11/22/2006  . OSTEOPOROSIS 11/22/2006  . Menopausal syndrome (hot flashes)   . Headache(784.0)   . Cancer Meloanoma left leg  . Hypertension     Medications:  Prescriptions prior to admission  Medication Sig Dispense Refill Last Dose  . acetaminophen (TYLENOL) 500 MG tablet Take 500 mg by mouth every 6 (six) hours as needed. For pain.   a week  . atorvastatin (LIPITOR) 40 MG tablet Take 1 tablet (40 mg total) by mouth daily. 90 tablet 3 07/07/2014 at Unknown time  . Calcium Carbonate-Vitamin D (CALTRATE 600+D PO) Take 1 tablet by mouth daily.     a week  . losartan (COZAAR) 100 MG tablet Take 1 tablet (100 mg total) by mouth daily. 90 tablet 3 07/07/2014 at Unknown time  . Polyvinyl Alcohol-Povidone (REFRESH OP) Place 1 drop into both eyes 2 (two) times daily.   07/08/2014 at Unknown time  . ciprofloxacin (CIPRO) 500 MG tablet Take 1 tablet (500 mg total) by mouth 2 (two) times daily. (Patient not taking: Reported on 07/08/2014) 14 tablet 0   . methocarbamol (ROBAXIN) 500 MG tablet Take 1 tablet (500 mg total) by mouth 2 (two) times daily. (Patient not taking: Reported on 05/05/2014) 20 tablet 0 Not Taking    Scheduled:  . atorvastatin  40 mg Oral Daily  .  ceFAZolin (ANCEF) IV  2 g Intravenous Q8H  . labetalol      . losartan  100 mg Oral Daily  . Warfarin - Pharmacist Dosing Inpatient   Does not apply q1800    Assessment: Patient s/p Left hip intertrochanteric fracture fixation.  Pharmacy to dose warfarin. Goal of Therapy:  INR 2-3    Plan:  Start with Coumadin 2.5 mg on 2/17 PM. Check PT/INR daily. Provide Coumadin education.   Tyler Deis, Shea Stakes [place] 07/09/2014,6:01 AM

## 2014-07-09 NOTE — Progress Notes (Addendum)
[facility] PROGRESS NOTE  Assessment/Plan: Left intertrochanteric hip fracture: - She is status post fixation on 07/09/2014. - Appreciate orthosis assistance. - Anticoagulation per orthopedic physician. - PTOT consult pending. - She has good family support.  Essential hypertension - Continues to be stable, continue home medications.  Leukocytosis: Most likely due to stressed emargination now resolved has remained afebrile.    Code Status: full Family Communication: family  Disposition Plan: PT pending   Consultants:  ortho  Procedures:  l hip surgery  Antibiotics:  None  HPI/Subjective:  no complaints, she relates she has been pain-free. She is motivated and once just get started with physical therapy.  Patient relates she would like to go home with physical therapy.  Objective: Filed Vitals:   07/08/14 2310 07/09/14 0010 07/09/14 0219 07/09/14 0611  BP: 154/71 131/65 132/60 140/43  Pulse: 81 79 80 77  Temp: 97.9 F (36.6 C) 97.5 F (36.4 C) 98.8 F (37.1 C) 98.2 F (36.8 C)  TempSrc: Axillary Oral Axillary Oral  Resp: 18 18 18 18   Height: 5\' 5"  (1.651 m)     Weight: 68.947 kg (152 lb)     SpO2: 100% 99% 99% 99%    Intake/Output Summary (Last 24 hours) at 07/09/14 0808 Last data filed at 07/09/14 1914  Gross per 24 hour  Intake 1281.25 ml  Output    600 ml  Net 681.25 ml   Filed Weights   07/08/14 2310  Weight: 68.947 kg (152 lb)    Exam:  General: Alert, awake, oriented x3, in no acute distress.  HEENT: No bruits, no goiter.  Heart: Regular rate and rhythm.  Lungs: Good air movement, clear. Abdomen: Soft, nontender, nondistended, positive bowel sounds.  Neuro: Grossly intact, nonfocal.   Data Reviewed: Basic Metabolic Panel:  Recent Labs Lab 07/08/14 0852 07/09/14 0525  NA 139 139  K 4.0 4.2  CL 108 105  CO2 24 23  GLUCOSE 187* 170*  BUN 13 14  CREATININE 1.06 0.92  CALCIUM 8.9 8.7   Liver Function Tests: No  results for input(s): AST, ALT, ALKPHOS, BILITOT, PROT, ALBUMIN in the last 168 hours. No results for input(s): LIPASE, AMYLASE in the last 168 hours. No results for input(s): AMMONIA in the last 168 hours. CBC:  Recent Labs Lab 07/08/14 0852 07/09/14 0525  WBC 12.5* 8.8  NEUTROABS 10.2*  --   HGB 12.9 9.6*  HCT 40.0 29.1*  MCV 91.7 89.0  PLT 196 197   Cardiac Enzymes: No results for input(s): CKTOTAL, CKMB, CKMBINDEX, TROPONINI in the last 168 hours. BNP (last 3 results) No results for input(s): BNP in the last 8760 hours.  ProBNP (last 3 results) No results for input(s): PROBNP in the last 8760 hours.  CBG: No results for input(s): GLUCAP in the last 168 hours.  No results found for this or any previous visit (from the past 240 hour(s)).   Studies: Dg Chest 1 View  07/08/2014   CLINICAL DATA:  Preoperative evaluation for hip fracture. Hypertension  EXAM: CHEST  1 VIEW  COMPARISON:  September 16, 2011  FINDINGS: There is no edema or consolidation. The heart size and pulmonary vascularity are normal. No adenopathy. No pneumothorax. No bone lesions.  IMPRESSION: No edema or consolidation.   Electronically Signed   By: Lowella Grip III M.D.   On: 07/08/2014 08:57   Pelvis Portable  07/08/2014   CLINICAL DATA:  Status post fracture fixation left hip.  EXAM: PORTABLE PELVIS 1-2 VIEWS  COMPARISON:  Plain films left hip earlier this same date.  FINDINGS: The patient has a new intra medullary nail and mobile hip screw for fixation of an intertrochanteric right hip fracture. Gas in the soft tissues and surgical staples are noted. The distal and of the femoral component is not visualized. Hardware is intact. No new fracture is identified. The lesser trochanter remains distracted.  IMPRESSION: Status post fixation of a left intertrochanteric fracture without evidence of complication. The distal end of the femoral component is not included on the images.   Electronically Signed   By: Inge Rise M.D.   On: 07/08/2014 23:27   Dg C-arm 1-60 Min-no Report  07/08/2014   CLINICAL DATA: IM Nail left hip   C-ARM 1-60 MINUTES  Fluoroscopy was utilized by the requesting physician.  No radiographic  interpretation.    Dg Hip Unilat With Pelvis 2-3 Views Left  07/08/2014   CLINICAL DATA:  Fall with left hip pain. Initial encounter.  EXAM: LEFT HIP (WITH PELVIS) 2-3 VIEWS  COMPARISON:  None.  FINDINGS: Acute intertrochanteric left femur fracture with distraction and varus angulation. The lesser and greater trochanteric fragments have been distracted. The hip remains located.  No evidence of pelvic ring or right hip fracture. 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  Osteopenia.  IMPRESSION: Displaced intertrochanteric left femur fracture.   Electronically Signed   By: Monte Fantasia M.D.   On: 07/08/2014 08:56   Dg Femur Min 2 Views Left  07/08/2014   CLINICAL DATA:  Left proximal femoral fracture.  EXAM: LEFT FEMUR 2 VIEWS; DG C-ARM 1-60 MIN - NRPT [facility]  COMPARISON:  Radiographs dated 07/08/2014  FINDINGS: Multiple C-arm images demonstrate the patient has undergone open reduction and internal fixation with a long intramedullary nail and compression screw. There is improvement in the alignment and position of the major fracture fragments. Lesser trochanter is avulsed and displaced.  IMPRESSION: Open reduction and internal fixation of comminuted intertrochanteric fracture of the left femur.   Electronically Signed   By: Lorriane Shire M.D.   On: 07/08/2014 19:03    Scheduled Meds: . atorvastatin  40 mg Oral Daily  .  ceFAZolin (ANCEF) IV  2 g Intravenous Q8H  . losartan  100 mg Oral Daily  . polyethylene glycol  17 g Oral Daily  . Warfarin - Pharmacist Dosing Inpatient   Does not apply q1800   Continuous Infusions: . sodium chloride Stopped (07/08/14 1708)  . 0.9 % NaCl with KCl 20 mEq / L 75 mL/hr at 07/08/14 [place] [place]  [facility] Pager [phone]. If 8PM-8AM, please contact night-coverage at [email or web address], password [facility] 07/09/2014, 8:08 AM  LOS: 1 day

## 2014-07-10 LAB — PROTIME-INR
INR: 1.09 (ref 0.00–1.49)
Prothrombin Time: 14.2 seconds (ref 11.6–15.2)

## 2014-07-10 LAB — BASIC METABOLIC PANEL
ANION GAP: 6 (ref 5–15)
BUN: 19 mg/dL (ref 6–23)
CALCIUM: 8.6 mg/dL (ref 8.4–10.5)
CHLORIDE: 107 mmol/L (ref 96–112)
CO2: 27 mmol/L (ref 19–32)
CREATININE: 0.92 mg/dL (ref 0.50–1.10)
GFR calc non Af Amer: 56 mL/min — ABNORMAL LOW (ref >90)
GFR, EST AFRICAN AMERICAN: 65 mL/min — AB (ref >90)
Glucose, Bld: 127 mg/dL — ABNORMAL HIGH (ref 70–99)
Potassium: 4.1 mmol/L (ref 3.5–5.1)
Sodium: 140 mmol/L (ref 135–145)

## 2014-07-10 LAB — CBC
HCT: 24.8 % — ABNORMAL LOW (ref 36.0–46.0)
HEMOGLOBIN: 8.1 g/dL — AB (ref 12.0–15.0)
MCH: 29.7 pg (ref 26.0–34.0)
MCHC: 32.7 g/dL (ref 30.0–36.0)
MCV: 90.8 fL (ref 78.0–100.0)
Platelets: 159 10*3/uL (ref 150–400)
RBC: 2.73 MIL/uL — ABNORMAL LOW (ref 3.87–5.11)
RDW: 14.1 % (ref 11.5–15.5)
WBC: 9 10*3/uL (ref 4.0–10.5)

## 2014-07-10 LAB — URINE MICROSCOPIC-ADD ON

## 2014-07-10 LAB — URINALYSIS, ROUTINE W REFLEX MICROSCOPIC
Bilirubin Urine: NEGATIVE
Glucose, UA: NEGATIVE mg/dL
HGB URINE DIPSTICK: NEGATIVE
Ketones, ur: NEGATIVE mg/dL
Nitrite: NEGATIVE
Protein, ur: NEGATIVE mg/dL
Specific Gravity, Urine: 1.017 (ref 1.005–1.030)
UROBILINOGEN UA: 0.2 mg/dL (ref 0.0–1.0)
pH: 5 (ref 5.0–8.0)

## 2014-07-10 MED ORDER — WARFARIN SODIUM 6 MG PO TABS
6.0000 mg | ORAL_TABLET | Freq: Once | ORAL | Status: AC
Start: 2014-07-10 — End: 2014-07-10
  Administered 2014-07-10: 6 mg via ORAL
  Filled 2014-07-10: qty 1

## 2014-07-10 MED ORDER — METHOCARBAMOL 500 MG PO TABS: 500.0000 mg | ORAL_TABLET | Freq: Three times a day (TID) | ORAL | Status: DC | PRN

## 2014-07-10 MED ORDER — HYDROCODONE-ACETAMINOPHEN 5-325 MG PO TABS: 1.0000 | ORAL_TABLET | Freq: Four times a day (QID) | ORAL | Status: DC | PRN

## 2014-07-10 MED ORDER — WARFARIN SODIUM 5 MG PO TABS: 5.0000 mg | ORAL_TABLET | Freq: Every day | ORAL | Status: DC

## 2014-07-10 MED ORDER — COUMADIN BOOK
Freq: Once | Status: AC
  Administered 2014-07-10: 12:00:00
  Filled 2014-07-10: qty 1

## 2014-07-10 MED ORDER — ENSURE COMPLETE PO LIQD
237.0000 mL | Freq: Three times a day (TID) | ORAL | Status: DC
  Administered 2014-07-10 – 2014-07-11 (×3): 237 mL via ORAL

## 2014-07-10 NOTE — Discharge Instructions (Addendum)
Information on my medicine - Coumadin®   (Warfarin) ° °This medication education was reviewed with me or my healthcare representative as part of my discharge preparation.  The pharmacist that spoke with me during my hospital stay was:  Jackson, Rachel E, RPH ° °Why was Coumadin prescribed for you? °Coumadin was prescribed for you because you have a blood clot or a medical condition that can cause an increased risk of forming blood clots. Blood clots can cause serious health problems by blocking the flow of blood to the heart, lung, or brain. Coumadin can prevent harmful blood clots from forming. °As a reminder your indication for Coumadin is:   Select from menu ° °What test will check on my response to Coumadin? °While on Coumadin (warfarin) you will need to have an INR test regularly to ensure that your dose is keeping you in the desired range. The INR (international normalized ratio) number is calculated from the result of the laboratory test called prothrombin time (PT). ° °If an INR APPOINTMENT HAS NOT ALREADY BEEN MADE FOR YOU please schedule an appointment to have this lab work done by your health care provider within 7 days. °Your INR goal is usually a number between:  2 to 3 or your provider may give you a more narrow range like 2-2.5.  Ask your health care provider during an office visit what your goal INR is. ° °What  do you need to  know  About  COUMADIN? °Take Coumadin (warfarin) exactly as prescribed by your healthcare provider about the same time each day.  DO NOT stop taking without talking to the doctor who prescribed the medication.  Stopping without other blood clot prevention medication to take the place of Coumadin may increase your risk of developing a new clot or stroke.  Get refills before you run out. ° °What do you do if you miss a dose? °If you miss a dose, take it as soon as you remember on the same day then continue your regularly scheduled regimen the next day.  Do not take two doses of  Coumadin at the same time. ° °Important Safety Information °A possible side effect of Coumadin (Warfarin) is an increased risk of bleeding. You should call your healthcare provider right away if you experience any of the following: °? Bleeding from an injury or your nose that does not stop. °? Unusual colored urine (red or dark brown) or unusual colored stools (red or black). °? Unusual bruising for unknown reasons. °? A serious fall or if you hit your head (even if there is no bleeding). ° °Some foods or medicines interact with Coumadin® (warfarin) and might alter your response to warfarin. To help avoid this: °? Eat a balanced diet, maintaining a consistent amount of Vitamin K. °? Notify your provider about major diet changes you plan to make. °? Avoid alcohol or limit your intake to 1 drink for women and 2 drinks for men per day. °(1 drink is 5 oz. wine, 12 oz. beer, or 1.5 oz. liquor.) ° °Make sure that ANY health care provider who prescribes medication for you knows that you are taking Coumadin (warfarin).  Also make sure the healthcare provider who is monitoring your Coumadin knows when you have started a new medication including herbals and non-prescription products. ° °Coumadin® (Warfarin)  Major Drug Interactions  °Increased Warfarin Effect Decreased Warfarin Effect  °Alcohol (large quantities) °Antibiotics (esp. Septra/Bactrim, Flagyl, Cipro) °Amiodarone (Cordarone) °Aspirin (ASA) °Cimetidine (Tagamet) °Megestrol (Megace) °NSAIDs (ibuprofen, naproxen, etc.) °Piroxicam (  Feldene) Propafenone (Rythmol SR) Propranolol (Inderal) Isoniazid (INH) Posaconazole (Noxafil) Barbiturates (Phenobarbital) Carbamazepine (Tegretol) Chlordiazepoxide (Librium) Cholestyramine (Questran) Griseofulvin Oral Contraceptives Rifampin Sucralfate (Carafate) Vitamin K   Coumadin (Warfarin) Major Herbal Interactions  Increased Warfarin Effect Decreased Warfarin Effect  Garlic Ginseng Ginkgo biloba Coenzyme Q10 Green  tea St. Johns wort    Coumadin (Warfarin) FOOD Interactions  Eat a consistent number of servings per week of foods HIGH in Vitamin K (1 serving =  cup)  Collards (cooked, or boiled & drained) Kale (cooked, or boiled & drained) Mustard greens (cooked, or boiled & drained) Parsley *serving size only =  cup Spinach (cooked, or boiled & drained) Swiss chard (cooked, or boiled & drained) Turnip greens (cooked, or boiled & drained)  Eat a consistent number of servings per week of foods MEDIUM-HIGH in Vitamin K (1 serving = 1 cup)  Asparagus (cooked, or boiled & drained) Broccoli (cooked, boiled & drained, or raw & chopped) Brussel sprouts (cooked, or boiled & drained) *serving size only =  cup Lettuce, raw (green leaf, endive, romaine) Spinach, raw Turnip greens, raw & chopped   These websites have more information on Coumadin (warfarin):  FailFactory.se; VeganReport.com.au;  Information on my medicine - Coumadin   (Warfarin)  This medication education was reviewed with me or my healthcare representative as part of my discharge preparation.  The pharmacist that spoke with me during my hospital stay was:  Rudean Haskell, [facility]  Why was Coumadin prescribed for you? Coumadin was prescribed for you because you have a blood clot or a medical condition that can cause an increased risk of forming blood clots. Blood clots can cause serious health problems by blocking the flow of blood to the heart, lung, or brain. Coumadin can prevent harmful blood clots from forming. As a reminder your indication for Coumadin is:   Blood Clot Prevention After Orthopedic Surgery  What test will check on my response to Coumadin? While on Coumadin (warfarin) you will need to have an INR test regularly to ensure that your dose is keeping you in the desired range. The INR (international normalized ratio) number is calculated from the result of the laboratory test called prothrombin time  (PT).  If an INR APPOINTMENT HAS NOT ALREADY BEEN MADE FOR YOU please schedule an appointment to have this lab work done by your health care provider within 7 days. Your INR goal is usually a number between:  2 to 3 or your provider may give you a more narrow range like 2-2.5.  Ask your health care provider during an office visit what your goal INR is.  What  do you need to  know  About  COUMADIN? Take Coumadin (warfarin) exactly as prescribed by your healthcare provider about the same time each day.  DO NOT stop taking without talking to the doctor who prescribed the medication.  Stopping without other blood clot prevention medication to take the place of Coumadin may increase your risk of developing a new clot or stroke.  Get refills before you run out.  What do you do if you miss a dose? If you miss a dose, take it as soon as you remember on the same day then continue your regularly scheduled regimen the next day.  Do not take two doses of Coumadin at the same time.  Important Safety Information A possible side effect of Coumadin (Warfarin) is an increased risk of bleeding. You should call your healthcare provider right away if you experience any of the following: ?  Bleeding from an injury or your nose that does not stop. ? Unusual colored urine (red or dark brown) or unusual colored stools (red or black). ? Unusual bruising for unknown reasons. ? A serious fall or if you hit your head (even if there is no bleeding).  Some foods or medicines interact with Coumadin (warfarin) and might alter your response to warfarin. To help avoid this: ? Eat a balanced diet, maintaining a consistent amount of Vitamin K. ? Notify your provider about major diet changes you plan to make. ? Avoid alcohol or limit your intake to 1 drink for women and 2 drinks for men per day. (1 drink is 5 oz. wine, 12 oz. beer, or 1.5 oz. liquor.)  Make sure that ANY health care provider who prescribes medication for you  knows that you are taking Coumadin (warfarin).  Also make sure the healthcare provider who is monitoring your Coumadin knows when you have started a new medication including herbals and non-prescription products.  Coumadin (Warfarin)  Major Drug Interactions  Increased Warfarin Effect Decreased Warfarin Effect  Alcohol (large quantities) Antibiotics (esp. Septra/Bactrim, Flagyl, Cipro) Amiodarone (Cordarone) Aspirin (ASA) Cimetidine (Tagamet) Megestrol (Megace) NSAIDs (ibuprofen, naproxen, etc.) Piroxicam (Feldene) Propafenone (Rythmol SR) Propranolol (Inderal) Isoniazid (INH) Posaconazole (Noxafil) Barbiturates (Phenobarbital) Carbamazepine (Tegretol) Chlordiazepoxide (Librium) Cholestyramine (Questran) Griseofulvin Oral Contraceptives Rifampin Sucralfate (Carafate) Vitamin K   Coumadin (Warfarin) Major Herbal Interactions  Increased Warfarin Effect Decreased Warfarin Effect  Garlic Ginseng Ginkgo biloba Coenzyme Q10 Green tea St. Johns wort    Coumadin (Warfarin) FOOD Interactions  Eat a consistent number of servings per week of foods HIGH in Vitamin K (1 serving =  cup)  Collards (cooked, or boiled & drained) Kale (cooked, or boiled & drained) Mustard greens (cooked, or boiled & drained) Parsley *serving size only =  cup Spinach (cooked, or boiled & drained) Swiss chard (cooked, or boiled & drained) Turnip greens (cooked, or boiled & drained)  Eat a consistent number of servings per week of foods MEDIUM-HIGH in Vitamin K (1 serving = 1 cup)  Asparagus (cooked, or boiled & drained) Broccoli (cooked, boiled & drained, or raw & chopped) Brussel sprouts (cooked, or boiled & drained) *serving size only =  cup Lettuce, raw (green leaf, endive, romaine) Spinach, raw Turnip greens, raw & chopped   These websites have more information on Coumadin (warfarin):  FailFactory.se; VeganReport.com.au;

## 2014-07-10 NOTE — Progress Notes (Signed)
Clinical Social Work Department BRIEF PSYCHOSOCIAL ASSESSMENT 07/10/2014  Patient:  [name] Small, [name] Small     Account Number:  [id number]     Admit date:  07/08/2014  Clinical Social Worker:  Renold Genta  Date/Time:  07/10/2014 12:06 PM  Referred by:  Physician  Date Referred:  07/10/2014 Referred for  SNF Placement   Other Referral:   Interview type:  Patient Other interview type:   and son, [name] Small  & daughter-in-law, [name] Small    PSYCHOSOCIAL DATA Living Status:  ALONE Admitted from facility:   Level of care:   Primary support name:  [name] Small (son) h#: [phone] c#: [phone] Primary support relationship to patient:  CHILD, ADULT Degree of support available:   good    CURRENT CONCERNS Current Concerns  Post-Acute Placement   Other Concerns:    SOCIAL WORK ASSESSMENT / PLAN CSW received consult for SNF placement.   Assessment/plan status:  Information/Referral to [employer] Other assessment/ plan:   Information/referral to community resources:   CSW completed FL2 and faxed information out to [facility] SNFs - left message for [place] at [facility], awaiting call back re: bed availability.    PATIENT'S/FAMILY'S RESPONSE TO PLAN OF CARE: Patient reluctantly agreed to SNF after talking it over with her son & daughter-in-law. Son states that one of their family members had been to [facility] in the past and had a good experience.          [name] Small, [name] Small Clinical Social Worker cell #: [phone]

## 2014-07-10 NOTE — Progress Notes (Signed)
Clinical Social Work Department CLINICAL SOCIAL WORK PLACEMENT NOTE 07/10/2014  Patient:  [name] Small, [name] Small  Account Number:  [id number] Admit date:  07/08/2014  Clinical Social Worker:  Renold Genta  Date/time:  07/10/2014 12:12 PM  Clinical Social Work is seeking post-discharge placement for this patient at the following level of care:   [place]   (*CSW will update this form in Epic as items are completed)   07/10/2014  Patient/family provided with [facility] Department of Clinical Social Work's list of facilities offering this level of care within the geographic area requested by the patient (or if unable, by the patient's family).  07/10/2014  Patient/family informed of their freedom to choose among providers that offer the needed level of care, that participate in Medicare, Medicaid or managed care program needed by the patient, have an available bed and are willing to accept the patient.  07/10/2014  Patient/family informed of [facility]' ownership interest in [facility], as well as of the fact that they are under no obligation to receive care at this facility.  PASARR submitted to EDS on 07/10/2014 PASARR number received on 07/10/2014  FL2 transmitted to all facilities in geographic area requested by pt/family on  07/10/2014 FL2 transmitted to all facilities within larger geographic area on   Patient informed that his/her managed care company has contracts with or will negotiate with  certain facilities, including the following:     Patient/family informed of bed offers received:   Patient chooses bed at  Physician recommends and patient chooses bed at    Patient to be transferred to  on   Patient to be transferred to facility by  Patient and family notified of transfer on  Name of family member notified:    The following physician request were entered in Epic:   Additional Comments:    Raynaldo Opitz, [place] Social Worker cell #: [phone]

## 2014-07-10 NOTE — Progress Notes (Signed)
ANTICOAGULATION CONSULT NOTE - FOLLOW UP  Pharmacy Consult for warfarin Indication: VTE prophylaxis  Allergies  Allergen Reactions  . Lisinopril     Cough    Patient Measurements: Height: 5\' 5"  (165.1 cm) Weight: 152 lb (68.947 kg) IBW/kg (Calculated) : 57 Heparin Dosing Weight:   Vital Signs: Temp: 98.3 F (36.8 C) (02/19 0545) Temp Source: Oral (02/19 0545) BP: 147/58 mmHg (02/19 0545) Pulse Rate: 79 (02/19 0545)  Labs:  Recent Labs  07/08/14 0852 07/09/14 0525 07/10/14 0444  HGB 12.9 9.6* 8.1*  HCT 40.0 29.1* 24.8*  PLT 196 197 159  LABPROT 13.7 14.4 14.2  INR 1.04 1.11 1.09  CREATININE 1.06 0.92 0.92    Estimated Creatinine Clearance: 45.2 mL/min (by C-G formula based on Cr of 0.92).   Medical History: Past Medical History  Diagnosis Date  . DIVERTICULOSIS, COLON 11/22/2006  . HYPERLIPIDEMIA 11/22/2006  . OSTEOPOROSIS 11/22/2006  . Menopausal syndrome (hot flashes)   . Headache(784.0)   . Cancer Meloanoma left leg  . Hypertension     Medications:  Prescriptions prior to admission  Medication Sig Dispense Refill Last Dose  . acetaminophen (TYLENOL) 500 MG tablet Take 500 mg by mouth every 6 (six) hours as needed. For pain.   a week  . atorvastatin (LIPITOR) 40 MG tablet Take 1 tablet (40 mg total) by mouth daily. 90 tablet 3 07/07/2014 at Unknown time  . Calcium Carbonate-Vitamin D (CALTRATE 600+D PO) Take 1 tablet by mouth daily.     a week  . losartan (COZAAR) 100 MG tablet Take 1 tablet (100 mg total) by mouth daily. 90 tablet 3 07/07/2014 at Unknown time  . Polyvinyl Alcohol-Povidone (REFRESH OP) Place 1 drop into both eyes 2 (two) times daily.   07/08/2014 at Unknown time  . ciprofloxacin (CIPRO) 500 MG tablet Take 1 tablet (500 mg total) by mouth 2 (two) times daily. (Patient not taking: Reported on 07/08/2014) 14 tablet 0   . methocarbamol (ROBAXIN) 500 MG tablet Take 1 tablet (500 mg total) by mouth 2 (two) times daily. (Patient not taking: Reported on  05/05/2014) 20 tablet 0 Not Taking   Scheduled:  . atorvastatin  40 mg Oral Daily  . feeding supplement (ENSURE COMPLETE)  237 mL Oral BID BM  . losartan  100 mg Oral Daily  . polyethylene glycol  17 g Oral Daily  . Warfarin - Pharmacist Dosing Inpatient   Does not apply q1800    Assessment: 65 YOF s/p Left hip intertrochanteric fracture fixation.  Pharmacy has been consulted to dose warfarin for VTE prophylaxis.  Today: -INR SUBtherapeutic at 1.09  -H/H low, but stable. -Plts WNL -Began regular diet yesterday--no percentage of meal eaten recorded -No bleeding noted -No drug interactions noted  Goal of Therapy:  INR 2-3   Plan:  -Coumadin 6mg  today @ 1800 -Check PT/INR daily -Monitor CBC and signs of bleeding -Provide Coumadin education.  Gloriajean Dell, PharmD Candidate  Dolly Rias RPh 07/10/2014, 11:30 AM Pager [phone]

## 2014-07-10 NOTE — Evaluation (Signed)
Occupational Therapy Evaluation Patient Details Name: [name] Small MRN: [id number] DOB: [birth year] Today's Date: 07/10/2014    History of Present Illness 81 y/o female who fell  and broke L hip while on stairs at home .  Pt is now s/p L femoral IM nailing (07/08/14)   Clinical Impression   Pt is s/p hip fracture resulting in the deficits listed below (see OT Problem List). Pt will benefit from skilled OT to increase their safety and independence with ADL and functional mobility for ADL to facilitate discharge to venue listed below.      Follow Up Recommendations  SNF;Home health OT;Supervision/Assistance - 24 hour;Other (comment) (pt needs SNF- but wants [facility])    Equipment Recommendations  3 in 1 bedside comode    Recommendations for Other Services       Precautions / Restrictions Restrictions Weight Bearing Restrictions: Yes LLE Weight Bearing: Non weight bearing      Mobility Bed Mobility Overal bed mobility: Needs Assistance;+2 for physical assistance Bed Mobility: Supine to Sit     Supine to sit: +2 for physical assistance;Min assist     General bed mobility comments: A at leg and one person A with trunk.  Pt with good effort.  Transfers Overall transfer level: Needs assistance Equipment used: Rolling walker (2 wheeled)   Sit to Stand: Max assist Stand pivot transfers: +2 physical assistance;Max assist       General transfer comment: cues for hand placement and to maintaing L NWB.     Balance                                            ADL Overall ADL's : Needs assistance/impaired     Grooming: Sitting;Set up                   Toilet Transfer: RW;BSC;Cueing for safety;Cueing for sequencing;+2 for physical assistance;Maximal assistance   Toileting- Clothing Manipulation and Hygiene: Sit to/from stand;+2 for physical assistance;Total assistance         General ADL Comments: pt unable to maintain NWB with bed to chair transfer      Vision     Perception     Praxis      Pertinent Vitals/Pain Pain Score: 9  Pain Location: l thigh Pain Descriptors / Indicators: Sore;Aching Pain Intervention(s): Monitored during session;Premedicated before session;Repositioned;Ice applied;Limited activity within patient's tolerance     Hand Dominance     Extremity/Trunk Assessment Upper Extremity Assessment Upper Extremity Assessment: Generalized weakness           Communication Communication Communication: No difficulties   Cognition Arousal/Alertness: Awake/alert Behavior During Therapy: WFL for tasks assessed/performed Overall Cognitive Status: Within Functional Limits for tasks assessed                     General Comments       Exercises       Shoulder Instructions      Home Living Family/patient expects to be discharged to:: Private residence Living Arrangements: Alone   Type of Home: House Home Access: Stairs to enter Technical brewer of Steps: 4 Entrance Stairs-Rails: Left Home Layout: One level     Bathroom Shower/Tub: Teacher, early years/pre: Standard     Home Equipment: Cane - single point          Prior Functioning/Environment Level of Independence:  Independent             OT Diagnosis: Generalized weakness;Acute pain   OT Problem List: Decreased strength;Decreased activity tolerance;Decreased knowledge of precautions   OT Treatment/Interventions: Self-care/ADL training;Patient/family education;DME and/or AE instruction    OT Goals(Current goals can be found in the care plan section) Acute Rehab OT Goals Patient Stated Goal: Pt would like to go home OT Goal Formulation: With patient Time For Goal Achievement: August 09, 2014 Potential to Achieve Goals: Good  OT Frequency: Min 2X/week   Barriers to D/C:            Co-evaluation              End of Session Nurse Communication: Mobility status  Activity Tolerance: Patient limited by  pain Patient left: in chair;with call bell/phone within reach   Time: 0913-0953 OT Time Calculation (min): 40 min Charges:  OT General Charges $OT Visit: 1 Procedure OT Evaluation $Initial OT Evaluation Tier I: 1 Procedure OT Treatments $Self Care/Home Management : 23-37 mins G-Codes:    Payton Mccallum D 08-09-2014, 10:02 AM

## 2014-07-10 NOTE — Progress Notes (Signed)
Subjective: Pt stable - pain ok    Objective: Vital signs in last 24 hours: Temp:  [97.9 F (36.6 C)-98.3 F (36.8 C)] 98.3 F (36.8 C) (02/19 0545) Pulse Rate:  [68-79] 79 (02/19 0545) Resp:  [16] 16 (02/19 0545) BP: (128-154)/(41-58) 147/58 mmHg (02/19 0545) SpO2:  [98 %-100 %] 99 % (02/19 0545)  Intake/Output from previous day: 02/18 0701 - 02/19 0700 In: 2144.3 [P.O.:460; I.V.:1634.3; IV Piggyback:50] Out: 1050 [Urine:1050] Intake/Output this shift:    Exam:  Intact pulses distally Dorsiflexion/Plantar flexion intact  Labs:  Recent Labs  07/08/14 0852 07/09/14 0525 07/10/14 0444  HGB 12.9 9.6* 8.1*    Recent Labs  07/09/14 0525 07/10/14 0444  WBC 8.8 9.0  RBC 3.27* 2.73*  HCT 29.1* 24.8*  PLT 197 159    Recent Labs  07/09/14 0525 07/10/14 0444  NA 139 140  K 4.2 4.1  CL 105 107  CO2 23 27  BUN 14 19  CREATININE 0.92 0.92  GLUCOSE 170* 127*  CALCIUM 8.7 8.6    Recent Labs  07/09/14 0525 07/10/14 0444  INR 1.11 1.09    Assessment/Plan: Plan snf today or tomorrow - tdwb lle - pain meds and rx for anti dvt on chart   DEAN,GREGORY SCOTT 07/10/2014, 12:21 PM

## 2014-07-10 NOTE — Progress Notes (Signed)
Physical Therapy Treatment Patient Details Name: [name] Small MRN: [id number] DOB: 06/26/1928 Today's Date: 07/10/2014    History of Present Illness 81 y/o female who fell  and broke L hip while on stairs at home .  Pt is now s/p L femoral IM nailing (07/08/14)    PT Comments    POD # 2 pt progressing slowly and requires total to Max assist for transfers and bed mobility.  Assisted pt out of recliner to a wheelchair.  Pt propelled wc 110 feet intermittent due to c/o weakness/fatigue.  Assisted pt to bathroom using wc to get close to commode.  Assisted with toileting.  Assisted back to wc then back to bed.  Pt required increased time and several rest breaks due to pain level and fatigue. Son arrived at end of session.  Filled him in on pt's need for max/total assist for all care and that there is "no way she can safely function at home" as pt has declined rec for SNF earlier.  Pt and family agreed to [facility] center preferably.  Follow Up Recommendations  SNF [facility])     Equipment Recommendations       Recommendations for Other Services       Precautions / Restrictions Precautions Precautions: Fall Restrictions Weight Bearing Restrictions: Yes LLE Weight Bearing: Non weight bearing Other Position/Activity Restrictions: for 2 weeks per Ortho MD note    Mobility  Bed Mobility Overal bed mobility: Needs Assistance;+2 for physical assistance Bed Mobility: Sit to Supine     Supine to sit: +2 for physical assistance;Min assist Sit to supine: Total assist   General bed mobility comments: assisted back to bed per pt request  Transfers Overall transfer level: Needs assistance Equipment used: None Transfers: Sit to/from [employer] Transfers Sit to Stand: Max assist;Total assist Stand pivot transfers: Max assist;Total assist       General transfer comment: positioned transfer such that pt was performing 1/4 pivot turn towards her R.  assisted out of recliner  to wc.  assiststed from wc to commode.  assisited from commode to wc. assisted from wc to bed. a total of 4 transfers with 75% VC's on proper tech, proper hand placement and turn completion while NWB L LE.  Ambulation/Gait                 Hotel manager mobility: Yes Wheelchair propulsion: Both upper extremities Wheelchair parts: Needs assistance Distance: 110 feet Wheelchair Assistance Details (indicate cue type and reason): 25% VC's on proper tech and safety with turns.  Limited activity tolerance due to c/o "woozy" and "tired".   Modified Rankin (Stroke Patients Only)       Balance                                    Cognition Arousal/Alertness: Awake/alert Behavior During Therapy: WFL for tasks assessed/performed Overall Cognitive Status: Within Functional Limits for tasks assessed                      Exercises      General Comments        Pertinent Vitals/Pain Pain Assessment: 0-10 Pain Score: 9  Pain Location: L hip Pain Descriptors / Indicators: Sore;Tender Pain Intervention(s): Monitored during session;Premedicated before session;Repositioned;Ice applied    Home Living Family/patient  expects to be discharged to:: Private residence Living Arrangements: Alone   Type of Home: House Home Access: Stairs to enter Entrance Stairs-Rails: Left Home Layout: One level Home Equipment: [place] - single point      Prior Function Level of Independence: Independent          PT Goals (current goals can now be found in the care plan section) Acute Rehab PT Goals Patient Stated Goal: Pt would like to go home Progress towards PT goals: Progressing toward goals    Frequency  Min 5X/week    PT Plan      Co-evaluation             End of Session Equipment Utilized During Treatment: Gait belt Activity Tolerance: Patient limited by fatigue Patient left: in bed;with call  bell/phone within reach;with family/visitor present     Time: 1000-1055 PT Time Calculation (min) (ACUTE ONLY): 55 min  Charges:  $Therapeutic Activity: 23-37 mins $Wheel Chair Management: 23-37 mins                    G Codes:      Rica Koyanagi  PTA [facility]  Acute  Rehab Pager      [phone]

## 2014-07-10 NOTE — Discharge Summary (Addendum)
Physician Discharge Summary  Patient ID: [name] Small MRN: [id number] DOB/AGE: 1929-03-27 81 y.o.  Admit date: 07/08/2014 Discharge date: 07/11/2014  Admission Diagnoses:  Active Problems:   Essential hypertension   Intertrochanteric fracture of left hip   Discharge Diagnoses:  Same  Surgeries: Procedure(s): INTRAMEDULLARY (IM) NAIL  on 07/08/2014   Consultants:    Discharged Condition: Stable  Hospital Course: [name] Small is an 81 y.o. female who was admitted 07/08/2014 with a chief complaint of  Chief Complaint  Patient presents with  . Fall  . Hip Pain    left  , and found to have a diagnosis of hip fracture.  They were brought to the operating room on 07/08/2014 and underwent the above named procedures.Tolerated well - TDWB left leg and on coumadin for dvt prophylaxis. Pain controlled.    Antibiotics given:      Anti-infectives    Start     Dose/Rate Route Frequency Ordered Stop   07/09/14 0200  ceFAZolin (ANCEF) IVPB 2 g/50 mL premix     2 g 100 mL/hr over 30 Minutes Intravenous Every 8 hours 07/08/14 2249 07/09/14 1010    .  Recent vital signs:  Filed Vitals:   07/11/14 0630  BP: 130/43  Pulse: 64  Temp: 98.9 F (37.2 C)  Resp: 18    Recent laboratory studies:  Results for orders placed or performed during the hospital encounter of 96/75/91  Basic metabolic panel  Result Value Ref Range   Sodium 139 135 - 145 mmol/L   Potassium 4.0 3.5 - 5.1 mmol/L   Chloride 108 96 - 112 mmol/L   CO2 24 19 - 32 mmol/L   Glucose, Bld 187 (H) 70 - 99 mg/dL   BUN 13 6 - 23 mg/dL   Creatinine, Ser 1.06 0.50 - 1.10 mg/dL   Calcium 8.9 8.4 - 10.5 mg/dL   GFR calc non Af Amer 47 (L) >90 mL/min   GFR calc Af Amer 55 (L) >90 mL/min   Anion gap 7 5 - 15  CBC WITH DIFFERENTIAL  Result Value Ref Range   WBC 12.5 (H) 4.0 - 10.5 K/uL   RBC 4.36 3.87 - 5.11 MIL/uL   Hemoglobin 12.9 12.0 - 15.0 g/dL   HCT 40.0 36.0 - 46.0 %   MCV 91.7 78.0 - 100.0 fL   MCH 29.6 26.0 - 34.0  pg   MCHC 32.3 30.0 - 36.0 g/dL   RDW 13.3 11.5 - 15.5 %   Platelets 196 150 - 400 K/uL   Neutrophils Relative % 82 (H) 43 - 77 %   Neutro Abs 10.2 (H) 1.7 - 7.7 K/uL   Lymphocytes Relative 13 12 - 46 %   Lymphs Abs 1.7 0.7 - 4.0 K/uL   Monocytes Relative 4 3 - 12 %   Monocytes Absolute 0.5 0.1 - 1.0 K/uL   Eosinophils Relative 1 0 - 5 %   Eosinophils Absolute 0.1 0.0 - 0.7 K/uL   Basophils Relative 0 0 - 1 %   Basophils Absolute 0.0 0.0 - 0.1 K/uL  Protime-INR  Result Value Ref Range   Prothrombin Time 13.7 11.6 - 15.2 seconds   INR 1.04 0.00 - 1.49  CBC  Result Value Ref Range   WBC 8.8 4.0 - 10.5 K/uL   RBC 3.27 (L) 3.87 - 5.11 MIL/uL   Hemoglobin 9.6 (L) 12.0 - 15.0 g/dL   HCT 29.1 (L) 36.0 - 46.0 %   MCV 89.0 78.0 - 100.0 fL  MCH 29.4 26.0 - 34.0 pg   MCHC 33.0 30.0 - 36.0 g/dL   RDW 13.6 11.5 - 15.5 %   Platelets 197 150 - 400 K/uL  Basic metabolic panel  Result Value Ref Range   Sodium 139 135 - 145 mmol/L   Potassium 4.2 3.5 - 5.1 mmol/L   Chloride 105 96 - 112 mmol/L   CO2 23 19 - 32 mmol/L   Glucose, Bld 170 (H) 70 - 99 mg/dL   BUN 14 6 - 23 mg/dL   Creatinine, Ser 0.92 0.50 - 1.10 mg/dL   Calcium 8.7 8.4 - 10.5 mg/dL   GFR calc non Af Amer 56 (L) >90 mL/min   GFR calc Af Amer 65 (L) >90 mL/min   Anion gap 11 5 - 15  Protime-INR  Result Value Ref Range   Prothrombin Time 14.4 11.6 - 15.2 seconds   INR 1.11 0.00 - 1.49  CBC  Result Value Ref Range   WBC 9.0 4.0 - 10.5 K/uL   RBC 2.73 (L) 3.87 - 5.11 MIL/uL   Hemoglobin 8.1 (L) 12.0 - 15.0 g/dL   HCT 24.8 (L) 36.0 - 46.0 %   MCV 90.8 78.0 - 100.0 fL   MCH 29.7 26.0 - 34.0 pg   MCHC 32.7 30.0 - 36.0 g/dL   RDW 14.1 11.5 - 15.5 %   Platelets 159 150 - 400 K/uL  Basic metabolic panel  Result Value Ref Range   Sodium 140 135 - 145 mmol/L   Potassium 4.1 3.5 - 5.1 mmol/L   Chloride 107 96 - 112 mmol/L   CO2 27 19 - 32 mmol/L   Glucose, Bld 127 (H) 70 - 99 mg/dL   BUN 19 6 - 23 mg/dL   Creatinine,  Ser 0.92 0.50 - 1.10 mg/dL   Calcium 8.6 8.4 - 10.5 mg/dL   GFR calc non Af Amer 56 (L) >90 mL/min   GFR calc Af Amer 65 (L) >90 mL/min   Anion gap 6 5 - 15  Protime-INR  Result Value Ref Range   Prothrombin Time 14.2 11.6 - 15.2 seconds   INR 1.09 0.00 - 1.49  Urinalysis, Routine w reflex microscopic  Result Value Ref Range   Color, Urine YELLOW YELLOW   APPearance CLEAR CLEAR   Specific Gravity, Urine 1.017 1.005 - 1.030   pH 5.0 5.0 - 8.0   Glucose, UA NEGATIVE NEGATIVE mg/dL   Hgb urine dipstick NEGATIVE NEGATIVE   Bilirubin Urine NEGATIVE NEGATIVE   Ketones, ur NEGATIVE NEGATIVE mg/dL   Protein, ur NEGATIVE NEGATIVE mg/dL   Urobilinogen, UA 0.2 0.0 - 1.0 mg/dL   Nitrite NEGATIVE NEGATIVE   Leukocytes, UA TRACE (A) NEGATIVE  Urine microscopic-add on  Result Value Ref Range   WBC, UA 0-2 <3 WBC/hpf   Bacteria, UA RARE RARE  CBC  Result Value Ref Range   WBC 8.0 4.0 - 10.5 K/uL   RBC 2.62 (L) 3.87 - 5.11 MIL/uL   Hemoglobin 7.8 (L) 12.0 - 15.0 g/dL   HCT 23.8 (L) 36.0 - 46.0 %   MCV 90.8 78.0 - 100.0 fL   MCH 29.8 26.0 - 34.0 pg   MCHC 32.8 30.0 - 36.0 g/dL   RDW 14.0 11.5 - 15.5 %   Platelets 170 150 - 400 K/uL  Protime-INR  Result Value Ref Range   Prothrombin Time 14.7 11.6 - 15.2 seconds   INR 1.13 0.00 - 1.49  Type and screen  Result Value Ref Range  ABO/RH(D) O POS    Antibody Screen NEG    Sample Expiration 07/11/2014     Discharge Medications:     Medication List    STOP taking these medications        acetaminophen 500 MG tablet  Commonly known as:  TYLENOL     ciprofloxacin 500 MG tablet  Commonly known as:  CIPRO      TAKE these medications        atorvastatin 40 MG tablet  Commonly known as:  LIPITOR  Take 1 tablet (40 mg total) by mouth daily.     CALTRATE 600+D PO  Take 1 tablet by mouth daily.     HYDROcodone-acetaminophen 5-325 MG per tablet  Commonly known as:  NORCO/VICODIN  Take 1-2 tablets by mouth every 6 (six) hours as  needed for moderate pain.     losartan 100 MG tablet  Commonly known as:  COZAAR  Take 1 tablet (100 mg total) by mouth daily.     methocarbamol 500 MG tablet  Commonly known as:  ROBAXIN  Take 1 tablet (500 mg total) by mouth every 8 (eight) hours as needed for muscle spasms.     REFRESH OP  Place 1 drop into both eyes 2 (two) times daily.     warfarin 5 MG tablet  Commonly known as:  COUMADIN  Take 1 tablet (5 mg total) by mouth daily.        Diagnostic Studies: Dg Chest 1 View  07/08/2014   CLINICAL DATA:  Preoperative evaluation for hip fracture. Hypertension  EXAM: CHEST  1 VIEW  COMPARISON:  September 16, 2011  FINDINGS: There is no edema or consolidation. The heart size and pulmonary vascularity are normal. No adenopathy. No pneumothorax. No bone lesions.  IMPRESSION: No edema or consolidation.   Electronically Signed   By: Lowella Grip III M.D.   On: 07/08/2014 08:57   Pelvis Portable  07/08/2014   CLINICAL DATA:  Status post fracture fixation left hip.  EXAM: PORTABLE PELVIS 1-2 VIEWS  COMPARISON:  Plain films left hip earlier this same date.  FINDINGS: The patient has a new intra medullary nail and mobile hip screw for fixation of an intertrochanteric right hip fracture. Gas in the soft tissues and surgical staples are noted. The distal and of the femoral component is not visualized. Hardware is intact. No new fracture is identified. The lesser trochanter remains distracted.  IMPRESSION: Status post fixation of a left intertrochanteric fracture without evidence of complication. The distal end of the femoral component is not included on the images.   Electronically Signed   By: Inge Rise M.D.   On: 07/08/2014 23:27   Dg C-arm 1-60 Min-no Report  07/08/2014   CLINICAL DATA: IM Nail left hip   C-ARM 1-60 MINUTES  Fluoroscopy was utilized by the requesting physician.  No radiographic  interpretation.    Dg Hip Unilat With Pelvis 2-3 Views Left  07/08/2014   CLINICAL DATA:   Fall with left hip pain. Initial encounter.  EXAM: LEFT HIP (WITH PELVIS) 2-3 VIEWS  COMPARISON:  None.  FINDINGS: Acute intertrochanteric left femur fracture with distraction and varus angulation. The lesser and greater trochanteric fragments have been distracted. The hip remains located.  No evidence of pelvic ring or right hip fracture. 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  Osteopenia.  IMPRESSION: Displaced intertrochanteric left femur fracture.   Electronically Signed   By: Monte Fantasia M.D.   On:  07/08/2014 08:56   Dg Femur Min 2 Views Left  07/08/2014   CLINICAL DATA:  Left proximal femoral fracture.  EXAM: LEFT FEMUR 2 VIEWS; DG C-ARM 1-60 MIN - NRPT [facility]  COMPARISON:  Radiographs dated 07/08/2014  FINDINGS: Multiple C-arm images demonstrate the patient has undergone open reduction and internal fixation with a long intramedullary nail and compression screw. There is improvement in the alignment and position of the major fracture fragments. Lesser trochanter is avulsed and displaced.  IMPRESSION: Open reduction and internal fixation of comminuted intertrochanteric fracture of the left femur.   Electronically Signed   By: Lorriane Shire M.D.   On: 07/08/2014 19:03    Disposition: Skilled nursing facility.  Patient needs PT/INR drawn on Monday.  Discharge Instructions    Call MD / Call 911    Complete by:  As directed   If you experience chest pain or shortness of breath, CALL 911 and be transported to the hospital emergency room.  If you develope a fever above 101 F, pus (white drainage) or increased drainage or redness at the wound, or calf pain, call your surgeon's office.     Call MD / Call 911    Complete by:  As directed   If you experience chest pain or shortness of breath, CALL 911 and be transported to the hospital emergency room.  If you develope a fever above 101.5 F, pus (white drainage) or increased drainage or redness at the wound, or calf  pain, call your surgeon's office.     Constipation Prevention    Complete by:  As directed   Drink plenty of fluids.  Prune juice may be helpful.  You may use a stool softener, such as Colace (over the counter) 100 mg twice a day.  Use MiraLax (over the counter) for constipation as needed.     Constipation Prevention    Complete by:  As directed   Drink plenty of fluids.  Prune juice may be helpful.  You may use a stool softener, such as Colace (over the counter) 100 mg twice a day.  Use MiraLax (over the counter) for constipation as needed.     DO NOT drive, shower or take a tub bath until instructed by your physician    Complete by:  As directed      Diet - low sodium heart healthy    Complete by:  As directed      Diet - low sodium heart healthy    Complete by:  As directed      Diet general    Complete by:  As directed      Discharge instructions    Complete by:  As directed   Touch down weight bearing left leg Keep incisions dry Return to [place] [facility] clinic 10 days from discharge     Discharge wound care:    Complete by:  As directed   If you have a hip bandage, keep it clean and dry.  Change your bandage as instructed by your health care providers.  If your bandage has been discontinued, keep your incision clean and dry.  Pat dry after showering.  DO NOT put lotion, powder, or antibiotic ointments on your incision.     Do not sit on low chairs, stoools or toilet seats, as it may be difficult to get up from low surfaces    Complete by:  As directed      Driving restrictions    Complete by:  As directed  No driving while taking narcotic pain meds.     Increase activity slowly as tolerated    Complete by:  As directed      Increase activity slowly as tolerated    Complete by:  As directed      Touch down weight bearing    Complete by:  As directed            Follow-up Information    Follow up with Nyoka Cowden, MD.   Specialty:  Internal Medicine   Contact  information:   [place] [address] [phone]       Follow up with [place].   Why:  home health physical therapy   Contact information:   [address] [place] [address] [phone]       Follow up with [place].   Why:  rolling walker and 3n1 (commode)   Contact information:   [place] [address] [phone]       Follow up with Meredith Pel, MD In 2 weeks.   Specialty:  Orthopedic Surgery   Why:  For suture removal, For wound re-check   Contact information:   [place] [address] [phone]        Signed: Marianna Payment 07/11/2014, 7:45 AM

## 2014-07-10 NOTE — Progress Notes (Signed)
NUTRITION FOLLOW UP  Intervention:   - Increase Ensure Complete to TID - Magic cup TID with meals, each supplement provides 290 kcal and 9 grams of protein - RD will continue to monitor  Nutrition Dx:   Inadequate oral intake related to poor appetite as evidenced by poor po; ongoing  Goal:   Pt to meet >/= 90% of their estimated nutrition needs; not met  Monitor:   Weight trend, po intake, acceptance of supplements, labs  Assessment:   81 year old female with hypertension, presented to [facility] emergency department after an episode of fall at home while she was walking down the stairs. Patient explains she tripped and fell onto her left hip.   2/17: Pt with displaced intertrochanteric left femur fracture.  Currently NPO and surgery scheduled for this afternoon.  Pt report no recent weight loss. Usual body weight is 140-145 lbs.  Pt with poor appetite.  No updated weight in chart.  Will order nutritional supplements once diet advanced.  Pt with mild wasting at the temples.   2/19: Pt s/p hip fx repair 2/17 Pt reports poor appetite due to pain medication.  PO has been 0-75%. She is drinking Ensure BID.  She does not feel hungry, but family member ordered pt lunch tray of a baked sweet potato and soup which she does not feel like eating. Pt said that she may be able to tolerate ice cream. Will increase Ensure and send Magic Cups with trays. Labs reviewed  Height: Ht Readings from Last 1 Encounters:  07/08/14 _0  (1.651 m)    Weight Status:   Wt Readings from Last 1 Encounters:  07/08/14 152 lb (68.947 kg)    Re-estimated needs:  Kcal: 1800-2000 Protein: 90-100 g Fluid: 1.8-2.0 L/day  Skin: closed incision on hip  Diet Order: Diet regular   Intake/Output Summary (Last 24 hours) at 07/10/14 1141 Last data filed at 07/10/14 0545  Gross per 24 hour  Intake 1422.5 ml  Output    750 ml  Net  672.5 ml    Last BM: 2/19   Labs:   Recent Labs Lab  07/08/14 0852 07/09/14 0525 07/10/14 0444  NA 139 139 140  K 4.0 4.2 4.1  CL 108 105 107  CO2 _1 BUN _2 CREATININE 1.06 0.92 0.92  CALCIUM 8.9 8.7 8.6  GLUCOSE 187* 170* 127*    CBG (last 3)  No results for input(s): GLUCAP in the last 72 hours.  Scheduled Meds: . atorvastatin  40 mg Oral Daily  . coumadin book   Does not apply Once  . feeding supplement (ENSURE COMPLETE)  237 mL Oral BID BM  . losartan  100 mg Oral Daily  . polyethylene glycol  17 g Oral Daily  . warfarin  6 mg Oral ONCE-1800  . Warfarin - Pharmacist Dosing Inpatient   Does not apply q1800    Continuous Infusions: . sodium chloride Stopped (07/08/14 1708)  . 0.9 % NaCl with KCl 20 mEq / L 20 mL/hr at 07/09/14 [address] [place], [place], [place]

## 2014-07-11 DIAGNOSIS — I10 Essential (primary) hypertension: Secondary | ICD-10-CM | POA: Diagnosis not present

## 2014-07-11 DIAGNOSIS — M6281 Muscle weakness (generalized): Secondary | ICD-10-CM | POA: Diagnosis not present

## 2014-07-11 DIAGNOSIS — I480 Paroxysmal atrial fibrillation: Secondary | ICD-10-CM | POA: Diagnosis not present

## 2014-07-11 DIAGNOSIS — S72412D Displaced unspecified condyle fracture of lower end of left femur, subsequent encounter for closed fracture with routine healing: Secondary | ICD-10-CM | POA: Diagnosis not present

## 2014-07-11 DIAGNOSIS — S72142S Displaced intertrochanteric fracture of left femur, sequela: Secondary | ICD-10-CM | POA: Diagnosis not present

## 2014-07-11 DIAGNOSIS — Z4789 Encounter for other orthopedic aftercare: Secondary | ICD-10-CM | POA: Diagnosis not present

## 2014-07-11 DIAGNOSIS — M25552 Pain in left hip: Secondary | ICD-10-CM | POA: Diagnosis not present

## 2014-07-11 DIAGNOSIS — R2689 Other abnormalities of gait and mobility: Secondary | ICD-10-CM | POA: Diagnosis not present

## 2014-07-11 DIAGNOSIS — M21252 Flexion deformity, left hip: Secondary | ICD-10-CM | POA: Diagnosis not present

## 2014-07-11 DIAGNOSIS — D72829 Elevated white blood cell count, unspecified: Secondary | ICD-10-CM | POA: Diagnosis not present

## 2014-07-11 DIAGNOSIS — D62 Acute posthemorrhagic anemia: Secondary | ICD-10-CM | POA: Diagnosis not present

## 2014-07-11 DIAGNOSIS — R278 Other lack of coordination: Secondary | ICD-10-CM | POA: Diagnosis not present

## 2014-07-11 DIAGNOSIS — J9620 Acute and chronic respiratory failure, unspecified whether with hypoxia or hypercapnia: Secondary | ICD-10-CM | POA: Diagnosis not present

## 2014-07-11 DIAGNOSIS — E785 Hyperlipidemia, unspecified: Secondary | ICD-10-CM | POA: Diagnosis not present

## 2014-07-11 DIAGNOSIS — S72102D Unspecified trochanteric fracture of left femur, subsequent encounter for closed fracture with routine healing: Secondary | ICD-10-CM | POA: Diagnosis not present

## 2014-07-11 DIAGNOSIS — R791 Abnormal coagulation profile: Secondary | ICD-10-CM | POA: Diagnosis not present

## 2014-07-11 DIAGNOSIS — I4891 Unspecified atrial fibrillation: Secondary | ICD-10-CM | POA: Diagnosis not present

## 2014-07-11 LAB — PROTIME-INR
INR: 1.13 (ref 0.00–1.49)
PROTHROMBIN TIME: 14.7 s (ref 11.6–15.2)

## 2014-07-11 LAB — CBC
HCT: 23.8 % — ABNORMAL LOW (ref 36.0–46.0)
HEMOGLOBIN: 7.8 g/dL — AB (ref 12.0–15.0)
MCH: 29.8 pg (ref 26.0–34.0)
MCHC: 32.8 g/dL (ref 30.0–36.0)
MCV: 90.8 fL (ref 78.0–100.0)
Platelets: 170 10*3/uL (ref 150–400)
RBC: 2.62 MIL/uL — ABNORMAL LOW (ref 3.87–5.11)
RDW: 14 % (ref 11.5–15.5)
WBC: 8 10*3/uL (ref 4.0–10.5)

## 2014-07-11 MED ORDER — WARFARIN SODIUM 6 MG PO TABS
6.0000 mg | ORAL_TABLET | Freq: Once | ORAL | Status: AC
  Administered 2014-07-11: 6 mg via ORAL
  Filled 2014-07-11: qty 1

## 2014-07-11 NOTE — Progress Notes (Signed)
Medicare Important Message given? YES  Date Medicare IM given:  07/11/2014 Medicare IM given by: Jonnie Finner

## 2014-07-11 NOTE — Progress Notes (Signed)
CARE MANAGEMENT NOTE 07/11/2014  Patient:  [name] Small, [name] Small   Account Number:  [id number]  Date Initiated:  07/09/2014  Documentation initiated by:  [facility]  Subjective/Objective Assessment:   adm: INTRAMEDULLARY (IM) NAIL  (Left)     Action/Plan:   discharge planning   Anticipated DC Date:  07/09/2014   Anticipated DC Plan:  [place]  In-house referral  Clinical Social Worker      DC Planning Services  CM consult      Choice offered to / List presented to:     DME arranged  3-N-1  Vassie Moselle      DME agency  [facility].        Status of service:  Completed, signed off Medicare Important Message given?  YES (If response is "NO", the following Medicare IM given date fields will be blank) Date Medicare IM given:  07/11/2014 Medicare IM given by:  [facility] Date Additional Medicare IM given:   Additional Medicare IM given by:    Discharge Disposition:  [place]  Per UR Regulation:    If discussed at Long Length of Stay Meetings, dates discussed:    Comments:  07/11/2014 1000 NCM spoke to pt and plan is dc to SNF for rehab. Notified [facility] of plan to SNF.  Jonnie Finner RN CCM Case Mgmt phone [phone]  07/09/14 10:00 CM met with pt in room to offer choice of home health agency. Despite recc. of SNF, pt is adamant in going home.  Pt chooses [facility] to render HHPT.  Address and contact information verified by pt.  Referral called to [facility] rep, [name] Small.  CM called [facility] DME rep, [name] Small to please deliver the 3n1 and rolling walker to room prior to discharge. Order for 3n1, rolling walker, and HHPT/RN (INR monitoring) needed if plan continues to be to go home. Orders have been rquested.    Mariane Masters, BSN, CM [phone].

## 2014-07-11 NOTE — Progress Notes (Signed)
Report call to Gbo, Therapist, sports at [facility]. Patient will be transported by EMS to facility.

## 2014-07-11 NOTE — Clinical Social Work Placement (Signed)
Clinical Social Work Department CLINICAL SOCIAL WORK PLACEMENT NOTE 07/11/2014  Patient:  LITA, FLYNN  Account Number:  [id number] Admit date:  07/08/2014  Clinical Social Worker:  Renold Genta  Date/time:  07/10/2014 12:12 PM  Clinical Social Work is seeking post-discharge placement for this patient at the following level of care:   [place]   (*CSW will update this form in Epic as items are completed)   07/10/2014  Patient/family provided with [place] Department of Clinical Social Work's list of facilities offering this level of care within the geographic area requested by the patient (or if unable, by the patient's family).  07/10/2014  Patient/family informed of their freedom to choose among providers that offer the needed level of care, that participate in Medicare, Medicaid or managed care program needed by the patient, have an available bed and are willing to accept the patient.  07/10/2014  Patient/family informed of [facility]' ownership interest in [facility], as well as of the fact that they are under no obligation to receive care at this facility.  PASARR submitted to EDS on 07/10/2014 PASARR number received on 07/10/2014  FL2 transmitted to all facilities in geographic area requested by pt/family on  07/10/2014 FL2 transmitted to all facilities within larger geographic area on   Patient informed that his/her managed care company has contracts with or will negotiate with  certain facilities, including the following:     Patient/family informed of bed offers received:  07/10/2014 Patient chooses bed at [facility] Physician recommends and patient chooses bed at    Patient to be transferred to [facility] on  07/11/2014 Patient to be transferred to facility by ambulance Patient and family notified of transfer on 07/11/2014 Name of family member notified:  Donald/son  The following physician request were entered in Epic:   Additional  Comments:  .Dede Query, [place] Worker - Weekend Coverage cell #: [phone]

## 2014-07-11 NOTE — Progress Notes (Signed)
ANTICOAGULATION CONSULT NOTE - FOLLOW UP  Pharmacy Consult for warfarin Indication: VTE prophylaxis  Allergies  Allergen Reactions  . Lisinopril     Cough    Patient Measurements: Height: 5\' 5"  (165.1 cm) Weight: 152 lb (68.947 kg) IBW/kg (Calculated) : 57 Heparin Dosing Weight:   Vital Signs: Temp: 98.9 F (37.2 C) (02/20 0630) Temp Source: Oral (02/20 0630) BP: 130/43 mmHg (02/20 0630) Pulse Rate: 64 (02/20 0630)  Labs:  Recent Labs  07/08/14 0852 07/09/14 0525 07/10/14 0444 07/11/14 0515  HGB 12.9 9.6* 8.1* 7.8*  HCT 40.0 29.1* 24.8* 23.8*  PLT 196 197 159 170  LABPROT 13.7 14.4 14.2 14.7  INR 1.04 1.11 1.09 1.13  CREATININE 1.06 0.92 0.92  --     Estimated Creatinine Clearance: 45.2 mL/min (by C-G formula based on Cr of 0.92).   Medical History: Past Medical History  Diagnosis Date  . DIVERTICULOSIS, COLON 11/22/2006  . HYPERLIPIDEMIA 11/22/2006  . OSTEOPOROSIS 11/22/2006  . Menopausal syndrome (hot flashes)   . Headache(784.0)   . Cancer Meloanoma left leg  . Hypertension     Medications:  Prescriptions prior to admission  Medication Sig Dispense Refill Last Dose  . acetaminophen (TYLENOL) 500 MG tablet Take 500 mg by mouth every 6 (six) hours as needed. For pain.   a week  . atorvastatin (LIPITOR) 40 MG tablet Take 1 tablet (40 mg total) by mouth daily. 90 tablet 3 07/07/2014 at Unknown time  . Calcium Carbonate-Vitamin D (CALTRATE 600+D PO) Take 1 tablet by mouth daily.     a week  . losartan (COZAAR) 100 MG tablet Take 1 tablet (100 mg total) by mouth daily. 90 tablet 3 07/07/2014 at Unknown time  . Polyvinyl Alcohol-Povidone (REFRESH OP) Place 1 drop into both eyes 2 (two) times daily.   07/08/2014 at Unknown time  . ciprofloxacin (CIPRO) 500 MG tablet Take 1 tablet (500 mg total) by mouth 2 (two) times daily. (Patient not taking: Reported on 07/08/2014) 14 tablet 0   . methocarbamol (ROBAXIN) 500 MG tablet Take 1 tablet (500 mg total) by mouth 2 (two)  times daily. (Patient not taking: Reported on 05/05/2014) 20 tablet 0 Not Taking   Scheduled:  . atorvastatin  40 mg Oral Daily  . feeding supplement (ENSURE COMPLETE)  237 mL Oral TID BM  . losartan  100 mg Oral Daily  . polyethylene glycol  17 g Oral Daily  . Warfarin - Pharmacist Dosing Inpatient   Does not apply q1800    Assessment: 88 YOF s/p Left hip intertrochanteric fracture fixation.  Pharmacy has been consulted to dose warfarin for VTE prophylaxis.  Today: -INR remains SUBtherapeutic  -H/H low, trending down slowly post-op -Plts WNL -Regular diet, 50% of dinner recorded as eaten last night -No bleeding noted -No drug interactions noted -SCDs on -Noted plan for d/c to SNF today  Goal of Therapy:  INR 2-3   Plan:  -Repeat Coumadin 6mg  today @ noon -Check PT/INR daily -Monitor CBC and signs of bleeding -Provide Coumadin education.  Ralene Bathe, PharmD, BCPS 07/11/2014, 8:20 AM  Pager: [phone]

## 2014-07-11 NOTE — Progress Notes (Signed)
Hgb stable Dressing clean Dc to SNF today  N. Eduard Roux, MD [place] 7:44 AM

## 2014-07-13 ENCOUNTER — Encounter: Payer: Self-pay | Admitting: Internal Medicine

## 2014-07-13 ENCOUNTER — Non-Acute Institutional Stay (SKILLED_NURSING_FACILITY): Payer: Medicare Other | Admitting: Internal Medicine

## 2014-07-13 DIAGNOSIS — D72829 Elevated white blood cell count, unspecified: Secondary | ICD-10-CM | POA: Diagnosis not present

## 2014-07-13 DIAGNOSIS — E785 Hyperlipidemia, unspecified: Secondary | ICD-10-CM

## 2014-07-13 DIAGNOSIS — D62 Acute posthemorrhagic anemia: Secondary | ICD-10-CM | POA: Diagnosis not present

## 2014-07-13 DIAGNOSIS — I10 Essential (primary) hypertension: Secondary | ICD-10-CM | POA: Diagnosis not present

## 2014-07-13 DIAGNOSIS — S72142S Displaced intertrochanteric fracture of left femur, sequela: Secondary | ICD-10-CM

## 2014-07-13 DIAGNOSIS — K5900 Constipation, unspecified: Secondary | ICD-10-CM

## 2014-07-13 LAB — POCT INR: INR: 2.2 — AB (ref 0.9–1.1)

## 2014-07-13 NOTE — Progress Notes (Signed)
Patient ID: [name] Small, female   DOB: 02-16-1929, 81 y.o.   MRN: [id number]     Facility: [facility]    PCP: Nyoka Cowden, MD  Code Status: full code  Allergies  Allergen Reactions  . Lisinopril     Cough    Chief Complaint  Patient presents with  . New Admit To SNF     HPI:  81 year old patient is here for short term rehabilitation post hospital admission from 07/08/14-07/11/14 post fall with left hip fracture. She underwent left femoral intramedullary nailing. She is seen in her room today with her daughter in law present. She mentions being in pain at present. She has not had a bowel movement for 3-4 days. Normal bowel regimen is every other day for her. Denies muscle spasm. No other concerns.  Review of Systems:  Constitutional: positive for fatigue. Negative for fever, chills, diaphoresis.  HENT: Negative for headache, congestion Eyes: Negative for eye pain, blurred vision, double vision and discharge.  Respiratory: Negative for cough, shortness of breath and wheezing.   Cardiovascular: Negative for chest pain, palpitations, leg swelling.  Gastrointestinal: Negative for heartburn, nausea, vomiting, abdominal pain. appetite is fair Genitourinary: Negative for dysuria, flank pain.  Musculoskeletal: Negative for back pain, falls in facility Skin: Negative for itching, rash.  Neurological: Negative for dizziness, tingling, focal weakness Psychiatric/Behavioral: Negative for depression, anxiety, insomnia and memory loss.    Past Medical History  Diagnosis Date  . DIVERTICULOSIS, COLON 11/22/2006  . HYPERLIPIDEMIA 11/22/2006  . OSTEOPOROSIS 11/22/2006  . Menopausal syndrome (hot flashes)   . Headache(784.0)   . Cancer Meloanoma left leg  . Hypertension    Past Surgical History  Procedure Laterality Date  . Appendectomy    . Cholecystectomy    . Bunionectomy    . Femur im nail Left 07/08/2014    Procedure: INTRAMEDULLARY (IM) NAIL ;   Surgeon: Meredith Pel, MD;  Location: [facility];  Service: Orthopedics;  Laterality: Left;   Social History:   reports that she has never smoked. She has never used smokeless tobacco. She reports that she does not drink alcohol or use illicit drugs.  Family History  Problem Relation Age of Onset  . Alzheimer's disease Sister   . Cardiomyopathy Brother     Medications: Patient's Medications  New Prescriptions   No medications on file  Previous Medications   ATORVASTATIN (LIPITOR) 40 MG TABLET    Take 1 tablet (40 mg total) by mouth daily.   CALCIUM CARBONATE-VITAMIN D (CALTRATE 600+D PO)    Take 1 tablet by mouth daily.     HYDROCODONE-ACETAMINOPHEN (NORCO/VICODIN) 5-325 MG PER TABLET    Take 1-2 tablets by mouth every 6 (six) hours as needed for moderate pain.   LOSARTAN (COZAAR) 100 MG TABLET    Take 1 tablet (100 mg total) by mouth daily.   METHOCARBAMOL (ROBAXIN) 500 MG TABLET    Take 1 tablet (500 mg total) by mouth every 8 (eight) hours as needed for muscle spasms.   POLYVINYL ALCOHOL-POVIDONE (REFRESH OP)    Place 1 drop into both eyes 2 (two) times daily.   WARFARIN (COUMADIN) 5 MG TABLET    Take 1 tablet (5 mg total) by mouth daily.  Modified Medications   No medications on file  Discontinued Medications   No medications on file     Physical Exam: Filed Vitals:   07/13/14 1209  BP: 130/58  Pulse: 64  Temp: 97 F (36.1 C)  Resp: 18  SpO2: 97%    General- elderly female, well built, in no acute distress Head- normocephalic, atraumatic Throat- moist mucus membrane Eyes- PERRLA, EOMI, no pallor, no icterus, no discharge, normal conjunctiva, normal sclera Neck- no cervical lymphadenopathy Cardiovascular- normal s1,s2, no murmurs, palpable dorsalis pedis and radial pulses, no leg edema Respiratory- bilateral clear to auscultation, no wheeze, no rhonchi, no crackles, no use of accessory muscles Abdomen- bowel sounds present, soft, non tender Musculoskeletal- able  to move all 4 extremities, limited range of motion on left hip area  Neurological- no focal deficit Skin- warm and dry, surgical incision on left hip area with 2 aquacel dressing Psychiatry- alert and oriented to person, place and time, normal mood and affect    Labs reviewed: Basic Metabolic Panel:  Recent Labs  07/08/14 0852 07/09/14 0525 07/10/14 0444  NA 139 139 140  K 4.0 4.2 4.1  CL 108 105 107  CO2 24 23 27   GLUCOSE 187* 170* 127*  BUN 13 14 19   CREATININE 1.06 0.92 0.92  CALCIUM 8.9 8.7 8.6   Liver Function Tests:  Recent Labs  04/07/14 0935  AST 29  ALT 18  ALKPHOS 66  BILITOT 0.7  PROT 7.6  ALBUMIN 4.5   CBC:  Recent Labs  04/07/14 0935 07/08/14 0852 07/09/14 0525 07/10/14 0444 07/11/14 0515  WBC 7.1 12.5* 8.8 9.0 8.0  NEUTROABS 4.8 10.2*  --   --   --   HGB 13.6 12.9 9.6* 8.1* 7.8*  HCT 41.2 40.0 29.1* 24.8* 23.8*  MCV 88.3 91.7 89.0 90.8 90.8  PLT 224.0 196 197 159 170    Assessment/Plan  Left intertrochanteric hip fracture She is status post IM nailing on 07/09/2014. Has follow up with orthopedics In 2 weeks. Will have her work with physical therapy and occupational therapy team to help with gait training and muscle strengthening exercises.fall precautions. Skin care. Encourage to be out of bed. As per d/c summary she is touch down weight bearing. Continue coumadin for dvt prophylaxis. Continue posterior hip precuations. Continue ca-vit d supplement. Continue norco 5-325 1-2 tab q6h prn pain and robaxin 500 mg q8h prn muscle spasm. Nurse to provide pain medication now prior to therapy  Anticoagulation inr 07/12/14 2.2 continue coumadin 5 mg daily and inr recheck on 07/20/14  Essential hypertension stable, continue cozaar 100 mg daily  Leukocytosis Resolved. Monitor clinically  Dyslipidemia Continue lipitor 40 mg daily  Constipation Start miralax 17 g daily with colace 100 mg bid for now and reassess  Blood loss anemia likely post  op, monitor h&h  Goals of care: short term rehabilitation  Labs/tests ordered: cbc   Family/ staff Communication: reviewed care plan with patient and nursing supervisor    Blanchie Serve, MD  [place] [phone] (Monday-Friday 8 am - 5 pm) [phone] (afterhours)

## 2014-07-15 ENCOUNTER — Encounter: Payer: Self-pay | Admitting: Registered Nurse

## 2014-07-15 ENCOUNTER — Non-Acute Institutional Stay (SKILLED_NURSING_FACILITY): Payer: Medicare Other | Admitting: Registered Nurse

## 2014-07-15 DIAGNOSIS — I4891 Unspecified atrial fibrillation: Secondary | ICD-10-CM

## 2014-07-15 LAB — CBC AND DIFFERENTIAL
HCT: 28 % — AB (ref 36–46)
Hemoglobin: 9.2 g/dL — AB (ref 12.0–16.0)
Platelets: 399 10*3/uL (ref 150–399)
WBC: 9.2 10^3/mL

## 2014-07-15 LAB — BASIC METABOLIC PANEL
BUN: 20 mg/dL (ref 4–21)
Creatinine: 0.9 mg/dL (ref 0.5–1.1)
Glucose: 126 mg/dL
Potassium: 4.3 mmol/L (ref 3.4–5.3)
Sodium: 138 mmol/L (ref 137–147)

## 2014-07-15 NOTE — Progress Notes (Signed)
Patient ID: [name] Small, female   DOB: Jan 30, 1929, 81 y.o.   MRN: [id number]   Place of Service: [facility]  Allergies  Allergen Reactions  . Lisinopril     Cough    Code Status: Full Code  Goals of Care: Longevity/STR  Chief Complaint  Patient presents with  . Acute Visit    new onset afib with RVR    HPI  81 y.o. female with PMH of HTN, OA, HLD, left hip fx s/p IM nailing is being seen for an acute visit at the request of nursing staff for the evaluation of possible afib with RVR per EKG. Patient reported that she was returning to bed after using the restroom last night and started feeling like her heart is beating out of her chest. Denies any other symptoms at the time. Has had similar symptom in the past. She denies any fever, chills, fatigue, headache, dizziness, chest pain, palpitations, shortness of breath, orthopnea, abdominal pain, or n/v. ROS otherwise unremarkable. She was placed on lopressor 25mg  twice daily by on-call provider last night w/o any issues. She is also currently on coumadin for DVT prophylaxis after Left hip surgery with therapeutic INR 2.2 on 07/12/14.   Past Medical History  Diagnosis Date  . DIVERTICULOSIS, COLON 11/22/2006  . HYPERLIPIDEMIA 11/22/2006  . OSTEOPOROSIS 11/22/2006  . Menopausal syndrome (hot flashes)   . Headache(784.0)   . Cancer Meloanoma left leg  . Hypertension     Past Surgical History  Procedure Laterality Date  . Appendectomy    . Cholecystectomy    . Bunionectomy    . Femur im nail Left 07/08/2014    Procedure: INTRAMEDULLARY (IM) NAIL ;  Surgeon: Meredith Pel, MD;  Location: [facility];  Service: Orthopedics;  Laterality: Left;   Family History  Problem Relation Age of Onset  . Alzheimer's disease Sister   . Cardiomyopathy Brother     History   Social History  . Marital Status: Widowed    Spouse Name: N/A  . Number of Children: N/A  . Years of Education: N/A   Occupational History  . Not on file.    Social History Main Topics  . Smoking status: Never Smoker   . Smokeless tobacco: Never Used  . Alcohol Use: No  . Drug Use: No  . Sexual Activity: No   Other Topics Concern  . Not on file   Social History Narrative      Medication List       This list is accurate as of: 07/15/14  2:00 PM.  Always use your most recent med list.               atorvastatin 40 MG tablet  Commonly known as:  LIPITOR  Take 1 tablet (40 mg total) by mouth daily.     CALTRATE 600+D PO  Take 1 tablet by mouth daily.     HYDROcodone-acetaminophen 5-325 MG per tablet  Commonly known as:  NORCO/VICODIN  Take 1-2 tablets by mouth every 6 (six) hours as needed for moderate pain.     losartan 100 MG tablet  Commonly known as:  COZAAR  Take 1 tablet (100 mg total) by mouth daily.     methocarbamol 500 MG tablet  Commonly known as:  ROBAXIN  Take 1 tablet (500 mg total) by mouth every 8 (eight) hours as needed for muscle spasms.     metoprolol tartrate 25 MG tablet  Commonly known as:  LOPRESSOR  Take  25 mg by mouth 2 (two) times daily.     REFRESH OP  Place 1 drop into both eyes 2 (two) times daily.     warfarin 5 MG tablet  Commonly known as:  COUMADIN  Take 1 tablet (5 mg total) by mouth daily.        Physical Exam  BP 120/87 mmHg  Pulse 93  Temp(Src) 97.8 F (36.6 C)  Resp 20  Ht 5\' 9"  (1.753 m)  Wt 145 lb (65.772 kg)  BMI 21.40 kg/m2  SpO2 95%  Constitutional: WDWN elderly female in no acute distress. Appears younger than stated age. HEENT: Normocephalic and atraumatic. PERRL. EOM intact. No scleral icterus. Oral mucosa moist. Posterior pharynx clear of any exudate or lesions.  Neck: Supple and nontender. No lymphadenopathy, masses, or thyromegaly. No JVD or carotid bruits. Cardiac: Irregularly irregular without appreciable murmurs, rubs, or gallops. Distal pulses intact. No dependent edema.  Lungs: No respiratory distress. Breath sounds clear bilaterally without rales,  rhonchi, or wheezes. Abdomen: Audible bowel sounds in all quadrants. Soft, nontender, nondistended.  Musculoskeletal: able to move all extremities.  Skin: Warm and dry. No rash noted.  Neurological: Alert and oriented to person, place, and time. No focal deficits. Psychiatric: Judgment and insight adequate. Appropriate mood and affect.   Labs Reviewed  CBC Latest Ref Rng 07/15/2014 07/11/2014 07/10/2014  WBC - 9.2 8.0 9.0  Hemoglobin 12.0 - 16.0 g/dL 9.2(A) 7.8(L) 8.1(L)  Hematocrit 36 - 46 % 28(A) 23.8(L) 24.8(L)  Platelets 150 - 399 K/L 399 170 159     CMP Latest Ref Rng 07/15/2014 07/10/2014 07/09/2014  Glucose 70 - 99 mg/dL - 127(H) 170(H)  BUN 4 - 21 mg/dL 20 19 14   Creatinine 0.5 - 1.1 mg/dL 0.9 0.92 0.92  Sodium 137 - 147 mmol/L 138 140 139  Potassium 3.4 - 5.3 mmol/L 4.3 4.1 4.2  Chloride 96 - 112 mmol/L - 107 105  CO2 19 - 32 mmol/L - 27 23  Calcium 8.4 - 10.5 mg/dL - 8.6 8.7  Total Protein 6.0 - 8.3 g/dL - - -  Total Bilirubin 0.2 - 1.2 mg/dL - - -  Alkaline Phos 39 - 117 U/L - - -  AST 0 - 37 U/L - - -  ALT 0 - 35 U/L - - -   Lab Results  Component Value Date   INR 2.2* 07/13/2014   INR 1.13 07/11/2014   INR 1.09 07/10/2014   Lab Results  Component Value Date   TSH 1.80 04/07/2014   EKG 07/14/14: Afib with RVR (126)  Assessment & Plan 1. New onset a-fib Continue lopressor 25mg  twice daily and coumadin for now. Cardiology consult initiated. Continue to monitor her status.   Labs: TSH  Family/Staff Communication Plan of care discussed with resident and nursing staff. Resident and nursing staff verbalized understanding and agree with plan of care. No additional questions or concerns reported.    Arthur Holms, MSN, AGNP-C [facility] [address] [place], [place] [address] [phone] [8am-5pm] After hours: [phone]

## 2014-07-20 ENCOUNTER — Encounter: Payer: Self-pay | Admitting: Registered Nurse

## 2014-07-20 ENCOUNTER — Non-Acute Institutional Stay (SKILLED_NURSING_FACILITY): Payer: Medicare Other | Admitting: Registered Nurse

## 2014-07-20 DIAGNOSIS — R791 Abnormal coagulation profile: Secondary | ICD-10-CM | POA: Diagnosis not present

## 2014-07-20 DIAGNOSIS — I480 Paroxysmal atrial fibrillation: Secondary | ICD-10-CM

## 2014-07-20 LAB — POCT INR: INR: 3.7 — AB (ref 0.9–1.1)

## 2014-07-20 NOTE — Progress Notes (Signed)
Patient ID: [name] Small, female   DOB: [birth year], 81 y.o.   MRN: [id number]   Place of Service: [facility]  Allergies  Allergen Reactions  . Lisinopril     Cough    Code Status: Full Code  Goals of Care: Longevity/STR  Chief Complaint  Patient presents with  . Acute Visit    elevated INR    HPI  81 y.o. female with PMH of HTN, HLD, Osteporosis, Left femur fracture s/p IM nailing among others is being seen for management of warfarin therapy. Patient is on warfarin anticoagulation for DVT prophylaxis and new onset paroxysmal afib with CHASVAS score of 4. Goal INR is 2 to 3. Today INR is 3.7 and current warfarin dose is 5mg  daily. No missed or extra doses in past week reported. Recently started on lopressor 25mg  twice daily with holding criteria for SBP <110 and HR <60. No upcoming procedure.   Review of Systems Constitutional: Negative for fever and chills. Cardiovascular: Negative for chest pain, palpitations, and leg swelling Respiratory: Negative cough, shortness of breath, and wheezing.  Gastrointestinal: Negative for nausea and vomiting. Negative for abdominal pain. Negative for bloody stool.   Genitourinary: Negative for hematuria Musculoskeletal: Negative for back pain, joint pain, and joint swelling  Neurological: Negative for dizziness and headache Skin: Negative for rash and wound.   Psychiatric: Negative for depression  Past Medical History  Diagnosis Date  . DIVERTICULOSIS, COLON 11/22/2006  . HYPERLIPIDEMIA 11/22/2006  . OSTEOPOROSIS 11/22/2006  . Menopausal syndrome (hot flashes)   . Headache(784.0)   . Cancer Meloanoma left leg  . Hypertension     Past Surgical History  Procedure Laterality Date  . Appendectomy    . Cholecystectomy    . Bunionectomy    . Femur im nail Left 07/08/2014    Procedure: INTRAMEDULLARY (IM) NAIL ;  Surgeon: Meredith Pel, MD;  Location: [facility];  Service: Orthopedics;  Laterality: Left;    History   Social  History  . Marital Status: Widowed    Spouse Name: N/A  . Number of Children: N/A  . Years of Education: N/A   Occupational History  . Not on file.   Social History Main Topics  . Smoking status: Never Smoker   . Smokeless tobacco: Never Used  . Alcohol Use: No  . Drug Use: No  . Sexual Activity: No   Other Topics Concern  . Not on file   Social History Narrative    Family History  Problem Relation Age of Onset  . Alzheimer's disease Sister   . Cardiomyopathy Brother       Medication List       This list is accurate as of: 07/20/14 11:46 AM.  Always use your most recent med list.               atorvastatin 40 MG tablet  Commonly known as:  LIPITOR  Take 1 tablet (40 mg total) by mouth daily.     CALTRATE 600+D PO  Take 1 tablet by mouth daily.     HYDROcodone-acetaminophen 5-325 MG per tablet  Commonly known as:  NORCO/VICODIN  Take 1-2 tablets by mouth every 6 (six) hours as needed for moderate pain.     losartan 100 MG tablet  Commonly known as:  COZAAR  Take 1 tablet (100 mg total) by mouth daily.     methocarbamol 500 MG tablet  Commonly known as:  ROBAXIN  Take 1 tablet (500 mg total) by  mouth every 8 (eight) hours as needed for muscle spasms.     metoprolol tartrate 25 MG tablet  Commonly known as:  LOPRESSOR  Take 25 mg by mouth 2 (two) times daily.     REFRESH OP  Place 1 drop into both eyes 2 (two) times daily.     warfarin 5 MG tablet  Commonly known as:  COUMADIN  Take 1 tablet (5 mg total) by mouth daily.        Physical Exam  BP 130/56 mmHg  Pulse 52  Temp(Src) 98.2 F (36.8 C)  Resp 20  Ht 5\' 6"  (1.676 m)  Wt 145 lb (65.772 kg)  BMI 23.41 kg/m2  Constitutional: WDWN elderly female in no acute distress. Appears younger than stated age. HEENT: Normocephalic and atraumatic. PERRL. EOM intact. No scleral icterus. Oral mucosa moist. Posterior pharynx clear of any exudate or lesions.  Neck: Supple and nontender. No  lymphadenopathy, masses, or thyromegaly. No JVD or carotid bruits. Cardiac: Slightly bradycardic. Regular rhythm without appreciable murmurs, rubs, or gallops. Distal pulses intact. Trace dependent edema.  Lungs: No respiratory distress. Breath sounds clear bilaterally without rales, rhonchi, or wheezes. Abdomen: Audible bowel sounds in all quadrants. Soft, nontender, nondistended.  Musculoskeletal: able to move all extremities.  Skin: Warm and dry. No rash noted.  Neurological: Alert and oriented to person, place, and time. No focal deficits. Psychiatric: Judgment and insight adequate. Appropriate mood and affect.   Labs Reviewed  Lab Results  Component Value Date   INR 3.7* 07/20/2014   INR 2.2* 07/13/2014   INR 1.13 07/11/2014     Assessment & Plan 1. Elevated INR 2. Paroxysmal a-fib INR 3.7 today. No signs of bleeding. Hold coumadin for now. Recheck INR daily until INR <3.0. Continue to monitor her status. Pending cardiology consult.   Family/Staff Communication Plan of care discussed with patient and nursing staff. Patient and nursing staff verbalized understanding and agree with plan of care. No additional questions or concerns reported.    Arthur Holms, MSN, AGNP-C [facility] [address] [place], [place] [address] [phone] [8am-5pm] After hours: [phone]

## 2014-07-22 ENCOUNTER — Encounter: Payer: Self-pay | Admitting: Registered Nurse

## 2014-07-22 ENCOUNTER — Non-Acute Institutional Stay (SKILLED_NURSING_FACILITY): Payer: Medicare Other | Admitting: Registered Nurse

## 2014-07-22 DIAGNOSIS — S72142S Displaced intertrochanteric fracture of left femur, sequela: Secondary | ICD-10-CM | POA: Diagnosis not present

## 2014-07-22 DIAGNOSIS — S72102D Unspecified trochanteric fracture of left femur, subsequent encounter for closed fracture with routine healing: Secondary | ICD-10-CM | POA: Diagnosis not present

## 2014-07-22 NOTE — Progress Notes (Signed)
Patient ID: [name] Small, female   DOB: 10-Oct-1928, 81 y.o.   MRN: [id number]   Place of Service: [facility]  Allergies  Allergen Reactions  . Lisinopril     Cough    Code Status: Full Code  Goals of Care: Longevity/STR  Chief Complaint  Patient presents with  . Acute Visit    pain    HPI  81 y.o. female with PMH of HTN, HLD, Osteporosis, Left femur fracture s/p IM nailing among others is being seen for an acute visit at the request of PT and nursing staff for pain management. Per PT, patient is unable to participate to her full potential secondary to pain of LLE. She has orders for tylenol and norco as needed for pain with robaxin as needed for muscle spasms, but patient is very reluctant to taking pain med and muscle relaxer secondary to potential adverse effects. She would only take one tylenol 325mg  for pain and may be receptive to 650mg  if pain is severe.  Seen in room today. Family at bedside  Review of Systems Constitutional: Negative for fever and chills. Cardiovascular: Negative for chest pain, palpitations, and leg swelling Respiratory: Negative cough, shortness of breath, and wheezing.  Gastrointestinal: Negative for nausea and vomiting. Negative for abdominal pain. Genitourinary: Negative for dysuria Musculoskeletal: Negative for back pain. Positive for LLE pain Neurological: Negative for dizziness and headache Skin: Negative for rash Psychiatric: Negative for depression  Past Medical History  Diagnosis Date  . DIVERTICULOSIS, COLON 11/22/2006  . HYPERLIPIDEMIA 11/22/2006  . OSTEOPOROSIS 11/22/2006  . Menopausal syndrome (hot flashes)   . Headache(784.0)   . Cancer Meloanoma left leg  . Hypertension     Past Surgical History  Procedure Laterality Date  . Appendectomy    . Cholecystectomy    . Bunionectomy    . Femur im nail Left 07/08/2014    Procedure: INTRAMEDULLARY (IM) NAIL ;  Surgeon: Meredith Pel, MD;  Location: [facility];  Service:  Orthopedics;  Laterality: Left;    History   Social History  . Marital Status: Widowed    Spouse Name: N/A  . Number of Children: N/A  . Years of Education: N/A   Occupational History  . Not on file.   Social History Main Topics  . Smoking status: Never Smoker   . Smokeless tobacco: Never Used  . Alcohol Use: No  . Drug Use: No  . Sexual Activity: No   Other Topics Concern  . Not on file   Social History Narrative    Family History  Problem Relation Age of Onset  . Alzheimer's disease Sister   . Cardiomyopathy Brother       Medication List       This list is accurate as of: 07/22/14 12:26 PM.  Always use your most recent med list.               atorvastatin 40 MG tablet  Commonly known as:  LIPITOR  Take 1 tablet (40 mg total) by mouth daily.     CALTRATE 600+D PO  Take 1 tablet by mouth daily.     HYDROcodone-acetaminophen 5-325 MG per tablet  Commonly known as:  NORCO/VICODIN  Take 1-2 tablets by mouth every 6 (six) hours as needed for moderate pain.     lidocaine 5 %  Commonly known as:  LIDODERM  Place 1 patch onto the skin daily. Remove & Discard patch within 12 hours or as directed by MD  losartan 100 MG tablet  Commonly known as:  COZAAR  Take 1 tablet (100 mg total) by mouth daily.     methocarbamol 500 MG tablet  Commonly known as:  ROBAXIN  Take 1 tablet (500 mg total) by mouth every 8 (eight) hours as needed for muscle spasms.     metoprolol tartrate 25 MG tablet  Commonly known as:  LOPRESSOR  Take 25 mg by mouth 2 (two) times daily.     REFRESH OP  Place 1 drop into both eyes 2 (two) times daily.     warfarin 5 MG tablet  Commonly known as:  COUMADIN  Take 1 tablet (5 mg total) by mouth daily.        Physical Exam  BP 122/78 mmHg  Pulse 82  Temp(Src) 97.7 F (36.5 C)  Resp 20  SpO2 97%  Constitutional: WDWN elderly female in no acute distress. Appears younger than stated age. HEENT: Normocephalic and atraumatic.  PERRL. EOM intact. No scleral icterus. Oral mucosa moist. Posterior pharynx clear of any exudate or lesions.  Neck: Supple and nontender. No lymphadenopathy, masses, or thyromegaly. No JVD or carotid bruits. Cardiac: RRR without appreciable murmurs, rubs, or gallops. Distal pulses intact. Trace dependent edema.  Lungs: No respiratory distress. Breath sounds clear bilaterally without rales, rhonchi, or wheezes. Abdomen: Audible bowel sounds in all quadrants. Soft, nontender, nondistended.  Musculoskeletal: able to move all extremities. Limited ROM of LLE.  Skin: Warm and dry. No rash noted.  Neurological: Alert and oriented to person, place, and time. No focal deficits. Psychiatric: Judgment and insight adequate. Appropriate mood and affect.   Assessment & Plan 1. Intertrochanteric fracture of left hip, sequela Pain is not well-controlled to maximize therapy time secondary to fear of potential adverse effects. Education provided regarding side/adverse effects of analgesics and muscle relaxants. Encourage patient to utilize available medications for better pain management so that she can fully participate and make progress in therapy. Patient reported that she would like to stay off any narcotics. Will start lidocaine 5% patch topically to LLE daily. Reassess and continue to monitor.   Time spent: 25 minutes on counseling  Family/Staff Communication Plan of care discussed with patient, family, and nursing staff. Patient and nursing staff verbalized understanding and agree with plan of care. No additional questions or concerns reported.    Arthur Holms, MSN, AGNP-C [facility] [address] [place], [place] [address] [phone] [8am-5pm] After hours: [phone]

## 2014-07-27 ENCOUNTER — Non-Acute Institutional Stay (SKILLED_NURSING_FACILITY): Payer: Medicare Other | Admitting: Registered Nurse

## 2014-07-27 DIAGNOSIS — R791 Abnormal coagulation profile: Secondary | ICD-10-CM

## 2014-07-27 DIAGNOSIS — I480 Paroxysmal atrial fibrillation: Secondary | ICD-10-CM | POA: Diagnosis not present

## 2014-07-27 LAB — POCT INR: INR: 1.7 — AB (ref <=1.1)

## 2014-07-27 NOTE — Progress Notes (Signed)
Patient ID: [name] Small, female   DOB: [birth year], 81 y.o.   MRN: [id number]   Place of Service: [facility]  Allergies  Allergen Reactions  . Lisinopril     Cough    Code Status: Full Code  Goals of Care: Longevity/STR  Chief Complaint  Patient presents with  . Acute Visit    subtherapeutic INR    HPI  81 y.o. female with PMH of HTN, HLD, Osteporosis, Left femur fracture s/p IM nailing among others is being seen for management of warfarin therapy. Patient is on warfarin anticoagulation for DVT prophylaxis and new onset paroxysmal afib with CHASVAS score of 4. Goal INR is 2 to 3. Today INR is 1.7 and current warfarin dose is 4.5mg  daily. No missed dose reported.  No upcoming procedure. Seen in room today. Denies any concerns  Review of Systems Constitutional: Negative for fever and chills. Cardiovascular: Negative for chest pain, palpitations, and leg swelling Respiratory: Negative cough, shortness of breath, and wheezing.  Gastrointestinal: Negative for nausea and vomiting. Negative for abdominal pain. Negative for bloody stool.   Genitourinary: Negative for hematuria Musculoskeletal: Negative for back pain, joint pain, and joint swelling  Neurological: Negative for dizziness and headache Skin: Negative for rash and wound.   Psychiatric: Negative for depression  Past Medical History  Diagnosis Date  . DIVERTICULOSIS, COLON 11/22/2006  . HYPERLIPIDEMIA 11/22/2006  . OSTEOPOROSIS 11/22/2006  . Menopausal syndrome (hot flashes)   . Headache(784.0)   . Cancer Meloanoma left leg  . Hypertension     Past Surgical History  Procedure Laterality Date  . Appendectomy    . Cholecystectomy    . Bunionectomy    . Femur im nail Left 07/08/2014    Procedure: INTRAMEDULLARY (IM) NAIL ;  Surgeon: Meredith Pel, MD;  Location: [facility];  Service: Orthopedics;  Laterality: Left;    History   Social History  . Marital Status: Widowed    Spouse Name: N/A  . Number of  Children: N/A  . Years of Education: N/A   Occupational History  . Not on file.   Social History Main Topics  . Smoking status: Never Smoker   . Smokeless tobacco: Never Used  . Alcohol Use: No  . Drug Use: No  . Sexual Activity: No   Other Topics Concern  . Not on file   Social History Narrative    Family History  Problem Relation Age of Onset  . Alzheimer's disease Sister   . Cardiomyopathy Brother       Medication List       This list is accurate as of: 07/27/14  1:21 PM.  Always use your most recent med list.               atorvastatin 40 MG tablet  Commonly known as:  LIPITOR  Take 1 tablet (40 mg total) by mouth daily.     CALTRATE 600+D PO  Take 1 tablet by mouth daily.     HYDROcodone-acetaminophen 5-325 MG per tablet  Commonly known as:  NORCO/VICODIN  Take 1-2 tablets by mouth every 6 (six) hours as needed for moderate pain.     lidocaine 5 %  Commonly known as:  LIDODERM  Place 1 patch onto the skin daily. Remove & Discard patch within 12 hours or as directed by MD     losartan 100 MG tablet  Commonly known as:  COZAAR  Take 1 tablet (100 mg total) by mouth daily.  methocarbamol 500 MG tablet  Commonly known as:  ROBAXIN  Take 1 tablet (500 mg total) by mouth every 8 (eight) hours as needed for muscle spasms.     metoprolol tartrate 25 MG tablet  Commonly known as:  LOPRESSOR  Take 25 mg by mouth 2 (two) times daily.     REFRESH OP  Place 1 drop into both eyes 2 (two) times daily.     warfarin 1 MG tablet  Commonly known as:  COUMADIN  Take 4.5 mg by mouth daily.        Physical Exam  BP 147/72 mmHg  Pulse 59  Temp(Src) 98.5 F (36.9 C)  Resp 20  Constitutional: WDWN elderly female in no acute distress. Appears younger than stated age. HEENT: Normocephalic and atraumatic. PERRL. EOM intact. No scleral icterus. Oral mucosa moist. Posterior pharynx clear of any exudate or lesions.  Neck: No JVD or carotid bruits. Cardiac:  Slightly bradycardic. Regular rhythm without appreciable murmurs, rubs, or gallops. Distal pulses intact. Trace dependent edema.  Lungs: No respiratory distress. Breath sounds clear bilaterally without rales, rhonchi, or wheezes. Abdomen: Audible bowel sounds in all quadrants. Soft, nontender, nondistended.  Musculoskeletal: able to move all extremities.  Skin: Warm and dry. No rash noted.  Neurological: Alert and oriented to person, place, and time. No focal deficits. Psychiatric: Judgment and insight adequate. Appropriate mood and affect.   Labs Reviewed  Lab Results  Component Value Date   INR 1.7* 07/27/2014   INR 3.7* 07/20/2014   INR 2.2* 07/13/2014     Assessment & Plan 1. Paroxysmal a-fib 2. Subtherapeutic international normalized ratio (INR) INR 1.7 today. Will increase coumadin to 4.5mg  daily. Recheck INR on 08/03/14. Continue to monitor her status.   Family/Staff Communication Plan of care discussed with patient and nursing staff. Patient and nursing staff verbalized understanding and agree with plan of care. No additional questions or concerns reported.    Arthur Holms, MSN, AGNP-C [id number] [address] [place], [place] [address] [phone] [8am-5pm] After hours: [phone]

## 2014-07-28 ENCOUNTER — Non-Acute Institutional Stay (SKILLED_NURSING_FACILITY): Payer: Medicare Other | Admitting: Registered Nurse

## 2014-07-28 DIAGNOSIS — E785 Hyperlipidemia, unspecified: Secondary | ICD-10-CM

## 2014-07-28 DIAGNOSIS — S72142S Displaced intertrochanteric fracture of left femur, sequela: Secondary | ICD-10-CM | POA: Diagnosis not present

## 2014-07-28 DIAGNOSIS — K5900 Constipation, unspecified: Secondary | ICD-10-CM

## 2014-07-28 DIAGNOSIS — I480 Paroxysmal atrial fibrillation: Secondary | ICD-10-CM

## 2014-07-28 DIAGNOSIS — I10 Essential (primary) hypertension: Secondary | ICD-10-CM | POA: Diagnosis not present

## 2014-07-28 DIAGNOSIS — D62 Acute posthemorrhagic anemia: Secondary | ICD-10-CM

## 2014-07-28 NOTE — Progress Notes (Addendum)
Patient ID: [name] Small, female   DOB: 10-29-1928, 81 y.o.   MRN: [id number]   Place of Service: [facility]  Allergies  Allergen Reactions  . Lisinopril     Cough    Code Status: Full Code  Goals of Care: Longevity/STR  Chief Complaint  Patient presents with  . Discharge Note    HPI 81 y.o. female with PMH of HTN, HLD, osteoporosis among others is being seen for a discharge visit. Patient was here for short-term rehabilitation post hospital admission from 07/08/14 to 07/11/14 with left hip fracture s/p fall after missing a step. She underwent left femoral intramedullary nailing. Patient has worked with therapy team and is ready to be discharged home with [facility] PT/OT/RN and DME (Standard walker). Seen in room today. Denies any concerns. Family at bedside. Reported that pain is adequately controlled with lidocaine patch and prn tylenol. No issues with constipation.   Review of Systems Constitutional: Negative for fever and chills. Cardiovascular: Negative for chest pain, palpitations, and leg swelling Respiratory: Negative cough, shortness of breath, and wheezing.  Gastrointestinal: Negative for nausea and vomiting. Negative for abdominal pain. Genitourinary: Negative for dysuria Musculoskeletal: Negative for back pain. Positive for LLE pain Neurological: Negative for dizziness and headache Skin: Negative for rash Psychiatric: Negative for depression  Past Medical History  Diagnosis Date  . DIVERTICULOSIS, COLON 11/22/2006  . HYPERLIPIDEMIA 11/22/2006  . OSTEOPOROSIS 11/22/2006  . Menopausal syndrome (hot flashes)   . Headache(784.0)   . Cancer Meloanoma left leg  . Hypertension     Past Surgical History  Procedure Laterality Date  . Appendectomy    . Cholecystectomy    . Bunionectomy    . Femur im nail Left 07/08/2014    Procedure: INTRAMEDULLARY (IM) NAIL ;  Surgeon: Meredith Pel, MD;  Location: [facility];  Service: Orthopedics;  Laterality: Left;    History    Social History  . Marital Status: Widowed    Spouse Name: N/A  . Number of Children: N/A  . Years of Education: N/A   Occupational History  . Not on file.   Social History Main Topics  . Smoking status: Never Smoker   . Smokeless tobacco: Never Used  . Alcohol Use: No  . Drug Use: No  . Sexual Activity: No   Other Topics Concern  . Not on file   Social History Narrative    Family History  Problem Relation Age of Onset  . Alzheimer's disease Sister   . Cardiomyopathy Brother       Medication List       This list is accurate as of: 07/28/14  1:55 PM.  Always use your most recent med list.               atorvastatin 40 MG tablet  Commonly known as:  LIPITOR  Take 1 tablet (40 mg total) by mouth daily.     CALTRATE 600+D PO  Take 1 tablet by mouth daily.     lidocaine 5 %  Commonly known as:  LIDODERM  Place 1 patch onto the skin daily. Remove & Discard patch within 12 hours or as directed by MD     losartan 100 MG tablet  Commonly known as:  COZAAR  Take 1 tablet (100 mg total) by mouth daily.     metoprolol tartrate 25 MG tablet  Commonly known as:  LOPRESSOR  Take 12.5 mg by mouth 2 (two) times daily.     REFRESH OP  Place 1 drop into both eyes 2 (two) times daily.     warfarin 1 MG tablet  Commonly known as:  COUMADIN  Take 4.5 mg by mouth daily.        Physical Exam  BP 118/65 mmHg  Pulse 60  Temp(Src) 97.7 F (36.5 C)  Resp 18  Ht 5\' 6"  (1.676 m)  Wt 140 lb 8 oz (63.73 kg)  BMI 22.69 kg/m2  SpO2 97%  Constitutional: WDWN elderly female in no acute distress. Appears younger than stated age. HEENT: Normocephalic and atraumatic. PERRL. EOM intact. No scleral icterus. Oral mucosa moist. Posterior pharynx clear of any exudate or lesions.  Neck: Supple and nontender. No lymphadenopathy, masses, or thyromegaly. No JVD or carotid bruits. Cardiac: Slightly bradycardic, but regular rhythm. No appreciable murmurs, rubs, or gallops. Distal  pulses intact. Trace dependent edema.  Lungs: No respiratory distress. Breath sounds clear bilaterally without rales, rhonchi, or wheezes. Abdomen: Audible bowel sounds in all quadrants. Soft, nontender, nondistended.  Musculoskeletal: able to move all extremities. Limited ROM of LLE with generalized weakness.LLE tender to palpation. Surgical incision w/o signs of infection noted.  Skin: Warm and dry. No rash noted.  Neurological: Alert and oriented to person, place, and time. No focal deficits. Psychiatric: Judgment and insight adequate. Appropriate mood and affect.   Labs Reviewed  CBC Latest Ref Rng 07/15/2014 07/11/2014 07/10/2014  WBC - 9.2 8.0 9.0  Hemoglobin 12.0 - 16.0 g/dL 9.2(A) 7.8(L) 8.1(L)  Hematocrit 36 - 46 % 28(A) 23.8(L) 24.8(L)  Platelets 150 - 399 K/L 399 170 159    CMP Latest Ref Rng 07/15/2014 07/10/2014 07/09/2014  Glucose 70 - 99 mg/dL - 127(H) 170(H)  BUN 4 - 21 mg/dL 20 19 14   Creatinine 0.5 - 1.1 mg/dL 0.9 0.92 0.92  Sodium 137 - 147 mmol/L 138 140 139  Potassium 3.4 - 5.3 mmol/L 4.3 4.1 4.2  Chloride 96 - 112 mmol/L - 107 105  CO2 19 - 32 mmol/L - 27 23  Calcium 8.4 - 10.5 mg/dL - 8.6 8.7  Total Protein 6.0 - 8.3 g/dL - - -  Total Bilirubin 0.2 - 1.2 mg/dL - - -  Alkaline Phos 39 - 117 U/L - - -  AST 0 - 37 U/L - - -  ALT 0 - 35 U/L - - -   Lab Results  Component Value Date   INR 1.7* 07/27/2014   INR 3.7* 07/20/2014   INR 2.2* 07/13/2014    Assessment & Plan 1. Intertrochanteric fracture of left hip, sequela S/p left femoral IM nailing. Continue tylenol 325-650mg  every four hours as needed for pain with lidocaine 5% topically to LLE daily (12 hrs on/12 hrs off). Continue coumadin at 4.5mg  daily for DVT prophylaxis. Last INR 1.7 on 07/26/16. Will be discharged home with [facility] RN for INR monitoring. Next INR check is 08/03/14. Continue to work with [facility] PT/OT for gait/strength/balance training to restore/maximize function. Continue to f/u with ortho.   2.  Paroxysmal a-fib Stable. RRR on exam. Will decrease lopressor from 25mg  twice daily to 12.5mg  twice daily due to low HR in high 40s to low 50s. CHASVASC score 4. Continue coumadin 4.5mg  daily for now. Has appt with cardiology, Dr. Johnsie Cancel on 08/13/14 @10am .  3. Essential hypertension Stable. Continue losartan 100mg  daily with lopressor 12.5mg  twice daily. Encourage patient to keep BP and HR diary and review it with PCP  4. Dyslipidemia Continue lipitor 40mg  daily. F/u with pcp  5. Acute blood loss anemia Stable.  Recent h&h 9.2/28.3. PCP to monitor h&h  6. Constipation, unspecified constipation type Stable. Encourage hydration and ambulation as tolerated. Continue colace 100mg  twice daily as needed.   Home health services:PT/OT/RN DME required: [facility] PCP follow-up: Dr. Burnice Logan on 08/18/14 at 1:15 30-day supply of prescription medications provided.   Family/Staff Communication Plan of care discussed with patient and nursing staff. Patient and nursing staff verbalized understanding and agree with plan of care. No additional questions or concerns reported.    Arthur Holms, MSN, AGNP-C [facility] [address] [place], [place] [address] [phone] [8am-5pm] After hours: [phone]

## 2014-08-03 ENCOUNTER — Ambulatory Visit (INDEPENDENT_AMBULATORY_CARE_PROVIDER_SITE_OTHER): Payer: Medicare Other | Admitting: General Practice

## 2014-08-03 ENCOUNTER — Telehealth: Payer: Self-pay | Admitting: Internal Medicine

## 2014-08-03 DIAGNOSIS — I4891 Unspecified atrial fibrillation: Secondary | ICD-10-CM | POA: Diagnosis not present

## 2014-08-03 DIAGNOSIS — Z7901 Long term (current) use of anticoagulants: Secondary | ICD-10-CM

## 2014-08-03 DIAGNOSIS — I10 Essential (primary) hypertension: Secondary | ICD-10-CM | POA: Diagnosis not present

## 2014-08-03 DIAGNOSIS — Z5181 Encounter for therapeutic drug level monitoring: Secondary | ICD-10-CM | POA: Diagnosis not present

## 2014-08-03 DIAGNOSIS — Z4789 Encounter for other orthopedic aftercare: Secondary | ICD-10-CM | POA: Diagnosis not present

## 2014-08-03 DIAGNOSIS — E785 Hyperlipidemia, unspecified: Secondary | ICD-10-CM | POA: Diagnosis not present

## 2014-08-03 LAB — POCT INR: INR: 1.4

## 2014-08-03 NOTE — Progress Notes (Signed)
Pre visit review using our clinic review tool, if applicable. No additional management support is needed unless otherwise documented below in the visit note. 

## 2014-08-03 NOTE — Telephone Encounter (Signed)
[name] Small from [facility] called to say that pt  INR 1.4  PROTIME 16.2   CURRENT DOSE 4.5 MG DAILY

## 2014-08-03 NOTE — Telephone Encounter (Signed)
Spoke to pt, gave her orders per Dr. Raliegh Ip but pt was already given orders this morning when [facility] from [facility] was there from [place]. Told pt to do as instruced earlier and will send message to [facility] about scheduling follow up. Pt verbalized understanding.

## 2014-08-03 NOTE — Telephone Encounter (Signed)
Increase Coumadin to 5 mg daily Given additional 2.5 milligrams today only Recheck INR one week

## 2014-08-03 NOTE — Telephone Encounter (Signed)
Please see message and advise 

## 2014-08-04 DIAGNOSIS — E785 Hyperlipidemia, unspecified: Secondary | ICD-10-CM | POA: Diagnosis not present

## 2014-08-04 DIAGNOSIS — Z4789 Encounter for other orthopedic aftercare: Secondary | ICD-10-CM | POA: Diagnosis not present

## 2014-08-04 DIAGNOSIS — I4891 Unspecified atrial fibrillation: Secondary | ICD-10-CM | POA: Diagnosis not present

## 2014-08-04 DIAGNOSIS — Z7901 Long term (current) use of anticoagulants: Secondary | ICD-10-CM | POA: Diagnosis not present

## 2014-08-04 DIAGNOSIS — I10 Essential (primary) hypertension: Secondary | ICD-10-CM | POA: Diagnosis not present

## 2014-08-04 DIAGNOSIS — Z5181 Encounter for therapeutic drug level monitoring: Secondary | ICD-10-CM | POA: Diagnosis not present

## 2014-08-04 NOTE — Telephone Encounter (Signed)
Noted  

## 2014-08-04 NOTE — Telephone Encounter (Signed)
Cindy, I saw you gave pt coumadin orders, pt not sure if she has a follow up appointment. Please schedule.

## 2014-08-06 ENCOUNTER — Telehealth: Payer: Self-pay | Admitting: *Deleted

## 2014-08-06 ENCOUNTER — Ambulatory Visit (INDEPENDENT_AMBULATORY_CARE_PROVIDER_SITE_OTHER): Payer: Medicare Other | Admitting: General Practice

## 2014-08-06 DIAGNOSIS — Z5181 Encounter for therapeutic drug level monitoring: Secondary | ICD-10-CM | POA: Diagnosis not present

## 2014-08-06 DIAGNOSIS — I10 Essential (primary) hypertension: Secondary | ICD-10-CM | POA: Diagnosis not present

## 2014-08-06 DIAGNOSIS — Z7901 Long term (current) use of anticoagulants: Secondary | ICD-10-CM | POA: Diagnosis not present

## 2014-08-06 DIAGNOSIS — I4891 Unspecified atrial fibrillation: Secondary | ICD-10-CM | POA: Diagnosis not present

## 2014-08-06 DIAGNOSIS — Z4789 Encounter for other orthopedic aftercare: Secondary | ICD-10-CM | POA: Diagnosis not present

## 2014-08-06 DIAGNOSIS — E785 Hyperlipidemia, unspecified: Secondary | ICD-10-CM | POA: Diagnosis not present

## 2014-08-06 LAB — POCT INR: INR: 1.6

## 2014-08-06 NOTE — Progress Notes (Signed)
Pre visit review using our clinic review tool, if applicable. No additional management support is needed unless otherwise documented below in the visit note. 

## 2014-08-06 NOTE — Telephone Encounter (Signed)
Received phone call from [place] saw pt today and said pt's heart rate is 44, blood pressure 118/63 resting. Pt is asymptomatic. Pt is taking Metoprolol 12.5 mg twice a day. Told him I will call him right back after speaking with Dr. Idolina Primer verbalized understanding.  Discussed with Dr. Raliegh Ip, pt to be seen tomorrow and do not take anymore Metoprolol till seen.  Called Merrilee Seashore back told him Dr. Raliegh Ip would like to see pt tomorrow and not to take anymore Metoprolol till he sees her tomorrow. Merrilee Seashore verbalized understanding and told Merrilee Seashore have pt come tomorrow at 11:00. Merrilee Seashore said that time is fine with pt. Appointment scheduled.

## 2014-08-07 ENCOUNTER — Encounter: Payer: Self-pay | Admitting: Internal Medicine

## 2014-08-07 ENCOUNTER — Ambulatory Visit (INDEPENDENT_AMBULATORY_CARE_PROVIDER_SITE_OTHER): Payer: Medicare Other | Admitting: Internal Medicine

## 2014-08-07 DIAGNOSIS — R001 Bradycardia, unspecified: Secondary | ICD-10-CM | POA: Diagnosis not present

## 2014-08-07 DIAGNOSIS — R5383 Other fatigue: Secondary | ICD-10-CM | POA: Diagnosis not present

## 2014-08-07 MED ORDER — METOPROLOL TARTRATE 25 MG PO TABS: ORAL_TABLET | ORAL | Status: DC

## 2014-08-07 NOTE — Progress Notes (Signed)
Subjective:    Patient ID: [name] Small, female    DOB: 11-10-1928, 81 y.o.   MRN: [id number]  HPI 81 year old patient who has essential hypertension.  She is status post treatment of an intertrochanteric fracture of the left hip approximately 6 weeks ago. At physical yesterday she was noted to have significant bradycardia with pulse rate in the forties.  She has been on metoprolol 12.5 mg twice a day.  Her afternoon dose was held yesterday and she has not taken any medication today.  She generally feels well with mild fatigue.  No presyncopal symptoms.  She has an appointment to see cardiology next week and does have a history of palpitations  Blood pressure on arrival after ambulation to the exam room 72 EKG today revealed a sinus bradycardia with a rate of 54  She is on Coumadin anticoagulation  2-D echocardiogram in 2013 revealed mild LVH and otherwise normal  Past Medical History  Diagnosis Date  . DIVERTICULOSIS, COLON 11/22/2006  . HYPERLIPIDEMIA 11/22/2006  . OSTEOPOROSIS 11/22/2006  . Menopausal syndrome (hot flashes)   . Headache(784.0)   . Cancer Meloanoma left leg  . Hypertension     History   Social History  . Marital Status: Widowed    Spouse Name: N/A  . Number of Children: N/A  . Years of Education: N/A   Occupational History  . Not on file.   Social History Main Topics  . Smoking status: Never Smoker   . Smokeless tobacco: Never Used  . Alcohol Use: No  . Drug Use: No  . Sexual Activity: No   Other Topics Concern  . Not on file   Social History Narrative    Past Surgical History  Procedure Laterality Date  . Appendectomy    . Cholecystectomy    . Bunionectomy    . Femur im nail Left 07/08/2014    Procedure: INTRAMEDULLARY (IM) NAIL ;  Surgeon: Meredith Pel, MD;  Location: [facility];  Service: Orthopedics;  Laterality: Left;    Family History  Problem Relation Age of Onset  . Alzheimer's disease Sister   . Cardiomyopathy Brother      Allergies  Allergen Reactions  . Lisinopril     Cough    Current Outpatient Prescriptions on File Prior to Visit  Medication Sig Dispense Refill  . atorvastatin (LIPITOR) 40 MG tablet Take 1 tablet (40 mg total) by mouth daily. 90 tablet 3  . Calcium Carbonate-Vitamin D (CALTRATE 600+D PO) Take 1 tablet by mouth daily.      [name] lidocaine (LIDODERM) 5 % Place 1 patch onto the skin daily. Remove & Discard patch within 12 hours or as directed by MD    . losartan (COZAAR) 100 MG tablet Take 1 tablet (100 mg total) by mouth daily. 90 tablet 3  . Polyvinyl Alcohol-Povidone (REFRESH OP) Place 1 drop into both eyes 2 (two) times daily.     No current facility-administered medications on file prior to visit.    BP 148/80 mmHg  Pulse 72  Temp(Src) 97.9 F (36.6 C) (Oral)  Resp 18  Ht 5\' 6"  (1.676 m)  Wt 137 lb (62.143 kg)  BMI 22.12 kg/m2  SpO2 98%    Review of Systems  Constitutional: Positive for fatigue.  HENT: Negative for congestion, dental problem, hearing loss, rhinorrhea, sinus pressure, sore throat and tinnitus.   Eyes: Negative for pain, discharge and visual disturbance.  Respiratory: Negative for cough and shortness of breath.   Cardiovascular: Negative for  chest pain, palpitations and leg swelling.  Gastrointestinal: Negative for nausea, vomiting, abdominal pain, diarrhea, constipation, blood in stool and abdominal distention.  Genitourinary: Negative for dysuria, urgency, frequency, hematuria, flank pain, vaginal bleeding, vaginal discharge, difficulty urinating, vaginal pain and pelvic pain.  Musculoskeletal: Positive for gait problem. Negative for joint swelling and arthralgias.  Skin: Negative for rash.  Neurological: Negative for dizziness, syncope, speech difficulty, weakness, numbness and headaches.  Hematological: Negative for adenopathy.  Psychiatric/Behavioral: Negative for behavioral problems, dysphoric mood and agitation. The patient is not nervous/anxious.         Objective:   Physical Exam  Constitutional: She appears well-developed and well-nourished. No distress.  Neck: Normal range of motion. No JVD present.  Cardiovascular: Normal rate and regular rhythm.   Rate approximately 60  Pulmonary/Chest: No respiratory distress. She has no wheezes. She has no rales.  O2 saturation 98  Lymphadenopathy:    She has no cervical adenopathy.          Assessment & Plan:   Essential hypertension Bradycardia.  We'll decrease metoprolol to 12 point 5 mg once daily.  The patient has an appointment to see cardiology in 6 days.  We'll continue to observe.  Reassess blood pressure and 6 days History of palpitations

## 2014-08-07 NOTE — Patient Instructions (Addendum)
Decrease metoprolol to 12.5 mg once daily in the morning  Follow-up cardiology next week as scheduled  Limit your sodium (Salt) intake  Okay to continue with physical therapy  Please check your blood pressure on a regular basis.  If it is consistently greater than 150/90, please make an office appointment.

## 2014-08-07 NOTE — Progress Notes (Signed)
Pre visit review using our clinic review tool, if applicable. No additional management support is needed unless otherwise documented below in the visit note. 

## 2014-08-10 ENCOUNTER — Ambulatory Visit (INDEPENDENT_AMBULATORY_CARE_PROVIDER_SITE_OTHER): Payer: Medicare Other | Admitting: General Practice

## 2014-08-10 DIAGNOSIS — Z5181 Encounter for therapeutic drug level monitoring: Secondary | ICD-10-CM | POA: Diagnosis not present

## 2014-08-10 DIAGNOSIS — Z7901 Long term (current) use of anticoagulants: Secondary | ICD-10-CM

## 2014-08-10 DIAGNOSIS — E785 Hyperlipidemia, unspecified: Secondary | ICD-10-CM | POA: Diagnosis not present

## 2014-08-10 DIAGNOSIS — Z4789 Encounter for other orthopedic aftercare: Secondary | ICD-10-CM | POA: Diagnosis not present

## 2014-08-10 DIAGNOSIS — I10 Essential (primary) hypertension: Secondary | ICD-10-CM | POA: Diagnosis not present

## 2014-08-10 DIAGNOSIS — I4891 Unspecified atrial fibrillation: Secondary | ICD-10-CM | POA: Diagnosis not present

## 2014-08-10 LAB — POCT INR: INR: 2.4

## 2014-08-10 NOTE — Progress Notes (Signed)
Pre visit review using our clinic review tool, if applicable. No additional management support is needed unless otherwise documented below in the visit note. 

## 2014-08-12 DIAGNOSIS — S72102D Unspecified trochanteric fracture of left femur, subsequent encounter for closed fracture with routine healing: Secondary | ICD-10-CM | POA: Diagnosis not present

## 2014-08-12 NOTE — Progress Notes (Signed)
Patient ID: [name] Small, female   DOB: 06-18-1928, 81 y.o.   MRN: [id number]     Cardiology Office Note   Date:  08/13/2014   ID:  ROSHANA SHUFFIELD, DOB 1928/11/11, MRN [id number]  PCP:  Nyoka Cowden, MD  Cardiologist:   Jenkins Rouge, MD   No chief complaint on file.     History of Present Illness: [name] Small is a 81 y.o. female who presents for evaluation of new onset afib However reading Dr Marthann Schiller note she was noted to have bradycardia and palpitations and beta blocker dose reduced.  She had a hip fracture about 8 weeks ago and is on coumadin This was started 2/21 after left intratrochanteric fracture Lopressor decreased last week by Dr Raliegh Ip  No syncope  Fall with hip fracture was mechanical Missed garage steps.  Very  Infrequent palpitations No documented afib that I can find She denies history of  Still wit some left thigh pain but doing well with rehab.  No chest pain or dyspnea     Past Medical History  Diagnosis Date  . DIVERTICULOSIS, COLON 11/22/2006  . HYPERLIPIDEMIA 11/22/2006  . OSTEOPOROSIS 11/22/2006  . Menopausal syndrome (hot flashes)   . Headache(784.0)   . Cancer Meloanoma left leg  . Hypertension     Past Surgical History  Procedure Laterality Date  . Appendectomy    . Cholecystectomy    . Bunionectomy    . Femur im nail Left 07/08/2014    Procedure: INTRAMEDULLARY (IM) NAIL ;  Surgeon: Meredith Pel, MD;  Location: [facility];  Service: Orthopedics;  Laterality: Left;     Current Outpatient Prescriptions  Medication Sig Dispense Refill  . atorvastatin (LIPITOR) 40 MG tablet Take 1 tablet (40 mg total) by mouth daily. 90 tablet 3  . Calcium Carbonate-Vitamin D (CALTRATE 600+D PO) Take 1 tablet by mouth daily.      [name] losartan (COZAAR) 100 MG tablet Take 1 tablet (100 mg total) by mouth daily. 90 tablet 3  . metoprolol tartrate (LOPRESSOR) 25 MG tablet 12.5 mg once daily in the morning    . Polyvinyl Alcohol-Povidone (REFRESH OP) Place 1 drop into both eyes 2  (two) times daily.    [name] warfarin (COUMADIN) 2 MG tablet   0  . warfarin (COUMADIN) 2.5 MG tablet   0   No current facility-administered medications for this visit.    Allergies:   Lisinopril    Social History:  The patient  reports that she has never smoked. She has never used smokeless tobacco. She reports that she does not drink alcohol or use illicit drugs.   Family History:  The patient's family history includes Alzheimer's disease in her sister; Cardiomyopathy in her brother.    ROS:  Please see the history of present illness.   Otherwise, review of systems are positive for none.   All other systems are reviewed and negative.    PHYSICAL EXAM: VS:  BP 90/52 mmHg  Pulse 55  Ht 5\' 5"  (1.651 m)  Wt 137 lb (62.143 kg)  BMI 22.80 kg/m2  SpO2 99% , BMI Body mass index is 22.8 kg/(m^2). GEN: Well nourished, well developed, in no acute distress HEENT: normal Neck: no JVD, carotid bruits, or masses Cardiac:  RRR; no murmurs, rubs, or gallops,no edema  Respiratory:  clear to auscultation bilaterally, normal work of breathing GI: soft, nontender, nondistended, + BS MS: no deformity or atrophy Skin: warm and dry, no rash Neuro:  Strength and sensation  are intact Psych: euthymic mood, full affect Left hip scar    EKG:  08/07/14  SB rate 54  Poor R wave progression no conduction or ST abnormality  2/18  SR rate 56 nonspecific ST changes   Recent Labs: 04/07/2014: ALT 18; TSH 1.80 07/15/2014: BUN 20; Creatinine 0.9; Hemoglobin 9.2*; Platelets 399; Potassium 4.3; Sodium 138    Lipid Panel    Component Value Date/Time   CHOL 277* 04/07/2014 0935   TRIG 114.0 04/07/2014 0935   HDL 62.00 04/07/2014 0935   CHOLHDL 4 04/07/2014 0935   VLDL 22.8 04/07/2014 0935   LDLCALC 192* 04/07/2014 0935   LDLDIRECT 176.0 02/24/2008 1424      Wt Readings from Last 3 Encounters:  08/13/14 137 lb (62.143 kg)  08/07/14 137 lb (62.143 kg)  07/28/14 140 lb 8 oz (63.73 kg)      Other  studies Reviewed: Additional studies/ records that were reviewed today include:  None.    ASSESSMENT AND PLAN:  1.  Bradycardia:  With relatively low BP stop lopressor all together.  2/24 NH notes indicate ? afib but I have no Epic documentation.  She may have SSS with isolated PAF. However she broke her hip due to mechanical fall and given her age and frail female body habitus her bleeding risk is not trivial Multiple f/u ECG;s since February NSR and no palpitations D/C lopresser and coumadin start 81 mg ASA  Discussed these issues personally with Dr Raliegh Ip on phone and he agrees to stopping meds 2. Left Hip Fracture  Continue rehab  Good family support May need xray for alignment given continued thigh pain    Current medicines are reviewed at length with the patient today.  The patient does not want to be on coumadin concerns regarding medicines.  The following changes have been made:  Stop lopressor and coumadin   Labs/ tests ordered today include: None  No orders of the defined types were placed in this encounter.     Disposition:   FU with me  In 3 months    Signed, Jenkins Rouge, MD  08/13/2014 10:03 AM    [facility] [place], [place], [place]  [address] Phone: [phone]; Fax: [phone]

## 2014-08-13 ENCOUNTER — Ambulatory Visit (INDEPENDENT_AMBULATORY_CARE_PROVIDER_SITE_OTHER): Payer: Medicare Other | Admitting: Cardiovascular Disease

## 2014-08-13 ENCOUNTER — Encounter: Payer: Self-pay | Admitting: Cardiovascular Disease

## 2014-08-13 VITALS — BP 90/52 | HR 55 | Ht 65.0 in | Wt 137.0 lb

## 2014-08-13 DIAGNOSIS — R001 Bradycardia, unspecified: Secondary | ICD-10-CM | POA: Diagnosis not present

## 2014-08-13 NOTE — Patient Instructions (Signed)
Your physician recommends that you schedule a follow-up appointment in: [place] has recommended you make the following change in your medication:  STOP  WARFARIN AND  METOPROLOL START  ASPIRIN  81 MG   EVERY DAY

## 2014-08-14 DIAGNOSIS — Z5181 Encounter for therapeutic drug level monitoring: Secondary | ICD-10-CM | POA: Diagnosis not present

## 2014-08-14 DIAGNOSIS — E785 Hyperlipidemia, unspecified: Secondary | ICD-10-CM | POA: Diagnosis not present

## 2014-08-14 DIAGNOSIS — J9620 Acute and chronic respiratory failure, unspecified whether with hypoxia or hypercapnia: Secondary | ICD-10-CM | POA: Diagnosis not present

## 2014-08-14 DIAGNOSIS — R2689 Other abnormalities of gait and mobility: Secondary | ICD-10-CM | POA: Diagnosis not present

## 2014-08-14 DIAGNOSIS — Z7901 Long term (current) use of anticoagulants: Secondary | ICD-10-CM | POA: Diagnosis not present

## 2014-08-14 DIAGNOSIS — Z4789 Encounter for other orthopedic aftercare: Secondary | ICD-10-CM | POA: Diagnosis not present

## 2014-08-14 DIAGNOSIS — I10 Essential (primary) hypertension: Secondary | ICD-10-CM | POA: Diagnosis not present

## 2014-08-14 DIAGNOSIS — I4891 Unspecified atrial fibrillation: Secondary | ICD-10-CM | POA: Diagnosis not present

## 2014-08-14 DIAGNOSIS — S72412D Displaced unspecified condyle fracture of lower end of left femur, subsequent encounter for closed fracture with routine healing: Secondary | ICD-10-CM | POA: Diagnosis not present

## 2014-08-17 DIAGNOSIS — I10 Essential (primary) hypertension: Secondary | ICD-10-CM | POA: Diagnosis not present

## 2014-08-17 DIAGNOSIS — Z4789 Encounter for other orthopedic aftercare: Secondary | ICD-10-CM | POA: Diagnosis not present

## 2014-08-17 DIAGNOSIS — E785 Hyperlipidemia, unspecified: Secondary | ICD-10-CM | POA: Diagnosis not present

## 2014-08-17 DIAGNOSIS — Z7901 Long term (current) use of anticoagulants: Secondary | ICD-10-CM | POA: Diagnosis not present

## 2014-08-17 DIAGNOSIS — I4891 Unspecified atrial fibrillation: Secondary | ICD-10-CM | POA: Diagnosis not present

## 2014-08-17 DIAGNOSIS — Z5181 Encounter for therapeutic drug level monitoring: Secondary | ICD-10-CM | POA: Diagnosis not present

## 2014-08-18 ENCOUNTER — Ambulatory Visit: Payer: Medicare Other | Admitting: Internal Medicine

## 2014-08-20 ENCOUNTER — Ambulatory Visit (INDEPENDENT_AMBULATORY_CARE_PROVIDER_SITE_OTHER): Payer: Medicare Other | Admitting: General Practice

## 2014-08-20 DIAGNOSIS — Z7901 Long term (current) use of anticoagulants: Secondary | ICD-10-CM

## 2014-08-21 DIAGNOSIS — Z4789 Encounter for other orthopedic aftercare: Secondary | ICD-10-CM | POA: Diagnosis not present

## 2014-08-21 DIAGNOSIS — E785 Hyperlipidemia, unspecified: Secondary | ICD-10-CM | POA: Diagnosis not present

## 2014-08-21 DIAGNOSIS — I4891 Unspecified atrial fibrillation: Secondary | ICD-10-CM | POA: Diagnosis not present

## 2014-08-21 DIAGNOSIS — I10 Essential (primary) hypertension: Secondary | ICD-10-CM | POA: Diagnosis not present

## 2014-08-21 DIAGNOSIS — Z7901 Long term (current) use of anticoagulants: Secondary | ICD-10-CM | POA: Diagnosis not present

## 2014-08-21 DIAGNOSIS — Z5181 Encounter for therapeutic drug level monitoring: Secondary | ICD-10-CM | POA: Diagnosis not present

## 2014-08-25 DIAGNOSIS — Z7901 Long term (current) use of anticoagulants: Secondary | ICD-10-CM | POA: Diagnosis not present

## 2014-08-25 DIAGNOSIS — Z5181 Encounter for therapeutic drug level monitoring: Secondary | ICD-10-CM | POA: Diagnosis not present

## 2014-08-25 DIAGNOSIS — Z4789 Encounter for other orthopedic aftercare: Secondary | ICD-10-CM | POA: Diagnosis not present

## 2014-08-25 DIAGNOSIS — I10 Essential (primary) hypertension: Secondary | ICD-10-CM | POA: Diagnosis not present

## 2014-08-25 DIAGNOSIS — E785 Hyperlipidemia, unspecified: Secondary | ICD-10-CM | POA: Diagnosis not present

## 2014-08-25 DIAGNOSIS — I4891 Unspecified atrial fibrillation: Secondary | ICD-10-CM | POA: Diagnosis not present

## 2014-08-27 DIAGNOSIS — E785 Hyperlipidemia, unspecified: Secondary | ICD-10-CM | POA: Diagnosis not present

## 2014-08-27 DIAGNOSIS — I10 Essential (primary) hypertension: Secondary | ICD-10-CM | POA: Diagnosis not present

## 2014-08-27 DIAGNOSIS — I4891 Unspecified atrial fibrillation: Secondary | ICD-10-CM | POA: Diagnosis not present

## 2014-08-27 DIAGNOSIS — Z4789 Encounter for other orthopedic aftercare: Secondary | ICD-10-CM | POA: Diagnosis not present

## 2014-08-27 DIAGNOSIS — Z5181 Encounter for therapeutic drug level monitoring: Secondary | ICD-10-CM | POA: Diagnosis not present

## 2014-08-27 DIAGNOSIS — Z7901 Long term (current) use of anticoagulants: Secondary | ICD-10-CM | POA: Diagnosis not present

## 2014-09-01 DIAGNOSIS — Z7901 Long term (current) use of anticoagulants: Secondary | ICD-10-CM | POA: Diagnosis not present

## 2014-09-01 DIAGNOSIS — E785 Hyperlipidemia, unspecified: Secondary | ICD-10-CM | POA: Diagnosis not present

## 2014-09-01 DIAGNOSIS — I4891 Unspecified atrial fibrillation: Secondary | ICD-10-CM | POA: Diagnosis not present

## 2014-09-01 DIAGNOSIS — I10 Essential (primary) hypertension: Secondary | ICD-10-CM | POA: Diagnosis not present

## 2014-09-01 DIAGNOSIS — Z5181 Encounter for therapeutic drug level monitoring: Secondary | ICD-10-CM | POA: Diagnosis not present

## 2014-09-01 DIAGNOSIS — Z4789 Encounter for other orthopedic aftercare: Secondary | ICD-10-CM | POA: Diagnosis not present

## 2014-09-03 DIAGNOSIS — E785 Hyperlipidemia, unspecified: Secondary | ICD-10-CM | POA: Diagnosis not present

## 2014-09-03 DIAGNOSIS — Z7901 Long term (current) use of anticoagulants: Secondary | ICD-10-CM | POA: Diagnosis not present

## 2014-09-03 DIAGNOSIS — Z4789 Encounter for other orthopedic aftercare: Secondary | ICD-10-CM | POA: Diagnosis not present

## 2014-09-03 DIAGNOSIS — I4891 Unspecified atrial fibrillation: Secondary | ICD-10-CM | POA: Diagnosis not present

## 2014-09-03 DIAGNOSIS — Z5181 Encounter for therapeutic drug level monitoring: Secondary | ICD-10-CM | POA: Diagnosis not present

## 2014-09-03 DIAGNOSIS — I10 Essential (primary) hypertension: Secondary | ICD-10-CM | POA: Diagnosis not present

## 2014-09-16 DIAGNOSIS — S72102D Unspecified trochanteric fracture of left femur, subsequent encounter for closed fracture with routine healing: Secondary | ICD-10-CM | POA: Diagnosis not present

## 2014-09-22 DIAGNOSIS — H01021 Squamous blepharitis right upper eyelid: Secondary | ICD-10-CM | POA: Diagnosis not present

## 2014-09-22 DIAGNOSIS — H26493 Other secondary cataract, bilateral: Secondary | ICD-10-CM | POA: Diagnosis not present

## 2014-09-22 DIAGNOSIS — H01024 Squamous blepharitis left upper eyelid: Secondary | ICD-10-CM | POA: Diagnosis not present

## 2014-10-01 ENCOUNTER — Ambulatory Visit (INDEPENDENT_AMBULATORY_CARE_PROVIDER_SITE_OTHER): Payer: Medicare Other | Admitting: Internal Medicine

## 2014-10-01 ENCOUNTER — Encounter: Payer: Self-pay | Admitting: Internal Medicine

## 2014-10-01 VITALS — BP 140/78 | HR 58 | Temp 98.0°F | Resp 18 | Ht 66.0 in | Wt 134.0 lb

## 2014-10-01 DIAGNOSIS — S72142D Displaced intertrochanteric fracture of left femur, subsequent encounter for closed fracture with routine healing: Secondary | ICD-10-CM | POA: Diagnosis not present

## 2014-10-01 DIAGNOSIS — I10 Essential (primary) hypertension: Secondary | ICD-10-CM | POA: Diagnosis not present

## 2014-10-01 DIAGNOSIS — R002 Palpitations: Secondary | ICD-10-CM

## 2014-10-01 NOTE — Patient Instructions (Signed)
Limit your sodium (Salt) intake  Return in 6 months for follow-up  

## 2014-10-01 NOTE — Progress Notes (Signed)
Subjective:    Patient ID: [name] Small, female    DOB: 28-Dec-1928, 81 y.o.   MRN: [id number]  HPI  81 year old patient who has essential hypertension.  She has been on beta blocker therapy and does have a history also of palpitations.  She is recovering from a left hip fracture and has noted to have some recent bradycardia.  She is now off Coumadin anticoagulation.  Metoprolol has been tapered and discontinued.  She generally feels well today.  Blood pressure repeat 140 over 78  Past Medical History  Diagnosis Date  . DIVERTICULOSIS, COLON 11/22/2006  . HYPERLIPIDEMIA 11/22/2006  . OSTEOPOROSIS 11/22/2006  . Menopausal syndrome (hot flashes)   . Headache(784.0)   . Cancer Meloanoma left leg  . Hypertension     History   Social History  . Marital Status: Widowed    Spouse Name: N/A  . Number of Children: N/A  . Years of Education: N/A   Occupational History  . Not on file.   Social History Main Topics  . Smoking status: Never Smoker   . Smokeless tobacco: Never Used  . Alcohol Use: No  . Drug Use: No  . Sexual Activity: No   Other Topics Concern  . Not on file   Social History Narrative    Past Surgical History  Procedure Laterality Date  . Appendectomy    . Cholecystectomy    . Bunionectomy    . Femur im nail Left 07/08/2014    Procedure: INTRAMEDULLARY (IM) NAIL ;  Surgeon: Meredith Pel, MD;  Location: [facility];  Service: Orthopedics;  Laterality: Left;    Family History  Problem Relation Age of Onset  . Alzheimer's disease Sister   . Cardiomyopathy Brother     Allergies  Allergen Reactions  . Lisinopril     Cough    Current Outpatient Prescriptions on File Prior to Visit  Medication Sig Dispense Refill  . atorvastatin (LIPITOR) 40 MG tablet Take 1 tablet (40 mg total) by mouth daily. 90 tablet 3  . Calcium Carbonate-Vitamin D (CALTRATE 600+D PO) Take 1 tablet by mouth daily.      [name] losartan (COZAAR) 100 MG tablet Take 1 tablet (100 mg total) by  mouth daily. 90 tablet 3  . Polyvinyl Alcohol-Povidone (REFRESH OP) Place 1 drop into both eyes 2 (two) times daily.     No current facility-administered medications on file prior to visit.    BP 140/78 mmHg  Pulse 58  Temp(Src) 98 F (36.7 C) (Oral)  Resp 18  Ht 5\' 6"  (1.676 m)  Wt 134 lb (60.782 kg)  BMI 21.64 kg/m2  SpO2 98%     Review of Systems  Constitutional: Negative.   HENT: Negative for congestion, dental problem, hearing loss, rhinorrhea, sinus pressure, sore throat and tinnitus.   Eyes: Negative for pain, discharge and visual disturbance.  Respiratory: Negative for cough and shortness of breath.   Cardiovascular: Positive for leg swelling. Negative for chest pain and palpitations.  Gastrointestinal: Negative for nausea, vomiting, abdominal pain, diarrhea, constipation, blood in stool and abdominal distention.  Genitourinary: Negative for dysuria, urgency, frequency, hematuria, flank pain, vaginal bleeding, vaginal discharge, difficulty urinating, vaginal pain and pelvic pain.  Musculoskeletal: Positive for gait problem. Negative for joint swelling and arthralgias.  Skin: Negative for rash.  Neurological: Negative for dizziness, syncope, speech difficulty, weakness, numbness and headaches.  Hematological: Negative for adenopathy.  Psychiatric/Behavioral: Negative for behavioral problems, dysphoric mood and agitation. The patient is not nervous/anxious.  Objective:   Physical Exam  Constitutional: She is oriented to person, place, and time. She appears well-developed and well-nourished.  HENT:  Head: Normocephalic.  Right Ear: External ear normal.  Left Ear: External ear normal.  Mouth/Throat: Oropharynx is clear and moist.  Eyes: Conjunctivae and EOM are normal. Pupils are equal, round, and reactive to light.  Neck: Normal range of motion. Neck supple. No thyromegaly present.  Cardiovascular: Normal rate, regular rhythm, normal heart sounds and intact  distal pulses.   Pulse rate 60 and regular  Repeat blood pressure 140/78  Pulmonary/Chest: Effort normal and breath sounds normal.  Abdominal: Soft. Bowel sounds are normal. She exhibits no mass. There is no tenderness.  Musculoskeletal: Normal range of motion. She exhibits edema.  Mild left ankle edema  Lymphadenopathy:    She has no cervical adenopathy.  Neurological: She is alert and oriented to person, place, and time.  Skin: Skin is warm and dry. No rash noted.  Psychiatric: She has a normal mood and affect. Her behavior is normal.          Assessment & Plan:   Hypertension.  Remains stable off beta blocker therapy.  We'll continue losartan. Low-salt diet recommended.  Home blood pressure readings recommended  Recheck 6 months or as needed Will report any consistent blood pressure elevations in excess of 140 over 90

## 2014-10-01 NOTE — Progress Notes (Signed)
Pre visit review using our clinic review tool, if applicable. No additional management support is needed unless otherwise documented below in the visit note. 

## 2014-10-27 DIAGNOSIS — Z13820 Encounter for screening for osteoporosis: Secondary | ICD-10-CM | POA: Diagnosis not present

## 2014-10-27 DIAGNOSIS — Z780 Asymptomatic menopausal state: Secondary | ICD-10-CM | POA: Diagnosis not present

## 2014-10-27 DIAGNOSIS — Z1231 Encounter for screening mammogram for malignant neoplasm of breast: Secondary | ICD-10-CM | POA: Diagnosis not present

## 2014-10-27 LAB — HM MAMMOGRAPHY: HM MAMMO: NEGATIVE

## 2014-10-27 LAB — HM DEXA SCAN

## 2014-11-02 ENCOUNTER — Encounter: Payer: Self-pay | Admitting: Internal Medicine

## 2014-11-02 NOTE — Progress Notes (Signed)
Patient ID: [name] Small, female   DOB: [birth year], 81 y.o.   MRN: [id number]     Cardiology Office Note   Date:  11/04/2014   ID:  [name] Small, DOB 1928-06-04, MRN [id number]  PCP:  Nyoka Cowden, MD  Cardiologist:   Jenkins Rouge, MD   No chief complaint on file.     History of Present Illness: [name] Small is a 81 y.o. female who presents for f/u bradycardia  Reading Dr Marthann Schiller note she was noted to have bradycardia and palpitations and beta blocker dose reduced.  She had a hip fracture and was on coumadin until recently This was started 07/12/14 after left intratrochanteric fracture  No syncope  Fall with hip fracture was mechanical Missed garage steps.  Very  Infrequent palpitations No documented afib that I can find She denies history of  Still wit some left thigh pain but doing well with rehab.  No chest pain or dyspnea   Stopped taking BP med as she didn't think she needed it  08/12/14 beta blocker stopped all together and decision not to place on long term anticoagulation  Past Medical History  Diagnosis Date  . DIVERTICULOSIS, COLON 11/22/2006  . HYPERLIPIDEMIA 11/22/2006  . OSTEOPOROSIS 11/22/2006  . Menopausal syndrome (hot flashes)   . Headache(784.0)   . Cancer Meloanoma left leg  . Hypertension     Past Surgical History  Procedure Laterality Date  . Appendectomy    . Cholecystectomy    . Bunionectomy    . Femur im nail Left 07/08/2014    Procedure: INTRAMEDULLARY (IM) NAIL ;  Surgeon: Meredith Pel, MD;  Location: [facility];  Service: Orthopedics;  Laterality: Left;     Current Outpatient Prescriptions  Medication Sig Dispense Refill  . acetaminophen (TYLENOL) 500 MG tablet Take 500 mg by mouth daily.    [name] aspirin 81 MG tablet Take 81 mg by mouth daily.    [name] atorvastatin (LIPITOR) 40 MG tablet Take 1 tablet (40 mg total) by mouth daily. 90 tablet 3  . Calcium Carbonate-Vitamin D (CALTRATE 600+D PO) Take 1 tablet by mouth daily.      [name] losartan (COZAAR) 100 MG  tablet Take 1 tablet (100 mg total) by mouth daily. 90 tablet 3  . Polyvinyl Alcohol-Povidone (REFRESH OP) Place 1 drop into both eyes 2 (two) times daily.     No current facility-administered medications for this visit.    Allergies:   Lisinopril    Social History:  The patient  reports that she has never smoked. She has never used smokeless tobacco. She reports that she does not drink alcohol or use illicit drugs.   Family History:  The patient's family history includes Alzheimer's disease in her sister; Cardiomyopathy in her brother.    ROS:  Please see the history of present illness.   Otherwise, review of systems are positive for none.   All other systems are reviewed and negative.    PHYSICAL EXAM: VS:  BP 170/80 mmHg  Pulse 54  Ht 5\' 5"  (1.651 m)  Wt 59.149 kg (130 lb 6.4 oz)  BMI 21.70 kg/m2  SpO2 99% , BMI Body mass index is 21.7 kg/(m^2). GEN: Well nourished, well developed, in no acute distress HEENT: normal Neck: no JVD, carotid bruits, or masses Cardiac:  RRR; no murmurs, rubs, or gallops,no edema  Respiratory:  clear to auscultation bilaterally, normal work of breathing GI: soft, nontender, nondistended, + BS MS: no deformity or atrophy Skin: warm and  dry, no rash Neuro:  Strength and sensation are intact Psych: euthymic mood, full affect Left hip scar    EKG:  08/07/14  SB rate 54  Poor R wave progression no conduction or ST abnormality  2/18  SR rate 56 nonspecific ST changes   Recent Labs: 04/07/2014: ALT 18; TSH 1.80 07/15/2014: BUN 20; Creatinine 0.9; Hemoglobin 9.2*; Platelets 399; Potassium 4.3; Sodium 138    Lipid Panel    Component Value Date/Time   CHOL 277* 04/07/2014 0935   TRIG 114.0 04/07/2014 0935   HDL 62.00 04/07/2014 0935   CHOLHDL 4 04/07/2014 0935   VLDL 22.8 04/07/2014 0935   LDLCALC 192* 04/07/2014 0935   LDLDIRECT 176.0 02/24/2008 1424      Wt Readings from Last 3 Encounters:  11/04/14 59.149 kg (130 lb 6.4 oz)  10/01/14  60.782 kg (134 lb)  08/13/14 62.143 kg (137 lb)      Other studies Reviewed: Additional studies/ records that were reviewed today include:  None.    ASSESSMENT AND PLAN:  1.  Bradycardia:  With relatively low BP stop lopressor all together.  2/24 NH notes indicate ? afib but I have no Epic documentation.  She may have SSS with isolated PAF. However she broke her hip due to mechanical fall and given her age and frail female body habitus her bleeding risk is not trivial Multiple f/u ECG;s since February NSR and no palpitations Rx ASA and no beta blocker  Discussed these issues personally with Dr Raliegh Ip on phone  2. Left Hip Fracture  Continue rehab  Good family support May need xray for alignment given continued thigh pain  3. HTN some white coat component but stressed compliance with her ARB   Current medicines are reviewed at length with the patient today.  The patient does not want to be on coumadin concerns regarding medicines.  The following changes have been made:  Stop lopressor and coumadin   Labs/ tests ordered today include: None  No orders of the defined types were placed in this encounter.     Disposition:   FU with me  In 3 months    Signed, Jenkins Rouge, MD  11/04/2014 9:17 AM    [facility] [place], [place], [place]  [address] Phone: [phone]; Fax: [phone]

## 2014-11-04 ENCOUNTER — Ambulatory Visit (INDEPENDENT_AMBULATORY_CARE_PROVIDER_SITE_OTHER): Payer: Medicare Other | Admitting: Cardiovascular Disease

## 2014-11-04 ENCOUNTER — Encounter: Payer: Self-pay | Admitting: Cardiovascular Disease

## 2014-11-04 VITALS — BP 170/80 | HR 54 | Ht 65.0 in | Wt 130.4 lb

## 2014-11-04 DIAGNOSIS — I10 Essential (primary) hypertension: Secondary | ICD-10-CM | POA: Diagnosis not present

## 2014-11-04 NOTE — Patient Instructions (Signed)
Medication Instructions:  NO CHANGES  Labwork: NONE  Testing/Procedures: NONE  Follow-Up: Your physician recommends that you schedule a follow-up appointment in:6 MONTHS  WITH  DR Johnsie Cancel   Any Other Special Instructions Will Be Listed Below (If Applicable).

## 2014-11-11 DIAGNOSIS — S72102D Unspecified trochanteric fracture of left femur, subsequent encounter for closed fracture with routine healing: Secondary | ICD-10-CM | POA: Diagnosis not present

## 2014-12-07 ENCOUNTER — Inpatient Hospital Stay (HOSPITAL_COMMUNITY)
Admission: EM | Admit: 2014-12-07 | Discharge: 2014-12-08 | DRG: 310 | Disposition: A | Discharge status: Home / Self Care | Payer: Medicare Other | Attending: Internal Medicine | Admitting: Internal Medicine

## 2014-12-07 ENCOUNTER — Encounter (HOSPITAL_COMMUNITY): Payer: Self-pay | Admitting: Emergency Medicine

## 2014-12-07 ENCOUNTER — Emergency Department (HOSPITAL_COMMUNITY): Payer: Medicare Other

## 2014-12-07 DIAGNOSIS — R748 Abnormal levels of other serum enzymes: Secondary | ICD-10-CM | POA: Diagnosis not present

## 2014-12-07 DIAGNOSIS — N3281 Overactive bladder: Secondary | ICD-10-CM | POA: Diagnosis not present

## 2014-12-07 DIAGNOSIS — R000 Tachycardia, unspecified: Secondary | ICD-10-CM | POA: Diagnosis not present

## 2014-12-07 DIAGNOSIS — E860 Dehydration: Secondary | ICD-10-CM | POA: Diagnosis not present

## 2014-12-07 DIAGNOSIS — I495 Sick sinus syndrome: Secondary | ICD-10-CM | POA: Diagnosis present

## 2014-12-07 DIAGNOSIS — R739 Hyperglycemia, unspecified: Secondary | ICD-10-CM | POA: Diagnosis present

## 2014-12-07 DIAGNOSIS — M810 Age-related osteoporosis without current pathological fracture: Secondary | ICD-10-CM | POA: Diagnosis present

## 2014-12-07 DIAGNOSIS — I4891 Unspecified atrial fibrillation: Principal | ICD-10-CM | POA: Diagnosis present

## 2014-12-07 DIAGNOSIS — Z888 Allergy status to other drugs, medicaments and biological substances status: Secondary | ICD-10-CM

## 2014-12-07 DIAGNOSIS — R778 Other specified abnormalities of plasma proteins: Secondary | ICD-10-CM | POA: Diagnosis present

## 2014-12-07 DIAGNOSIS — E1165 Type 2 diabetes mellitus with hyperglycemia: Secondary | ICD-10-CM | POA: Diagnosis present

## 2014-12-07 DIAGNOSIS — N289 Disorder of kidney and ureter, unspecified: Secondary | ICD-10-CM | POA: Diagnosis not present

## 2014-12-07 DIAGNOSIS — Z9049 Acquired absence of other specified parts of digestive tract: Secondary | ICD-10-CM | POA: Diagnosis not present

## 2014-12-07 DIAGNOSIS — I10 Essential (primary) hypertension: Secondary | ICD-10-CM | POA: Diagnosis not present

## 2014-12-07 DIAGNOSIS — E785 Hyperlipidemia, unspecified: Secondary | ICD-10-CM | POA: Diagnosis not present

## 2014-12-07 DIAGNOSIS — R7989 Other specified abnormal findings of blood chemistry: Secondary | ICD-10-CM | POA: Diagnosis present

## 2014-12-07 DIAGNOSIS — R61 Generalized hyperhidrosis: Secondary | ICD-10-CM | POA: Diagnosis not present

## 2014-12-07 LAB — BASIC METABOLIC PANEL
Anion gap: 10 (ref 5–15)
BUN: 6 mg/dL (ref 6–20)
CHLORIDE: 106 mmol/L (ref 101–111)
CO2: 24 mmol/L (ref 22–32)
CREATININE: 1.11 mg/dL — AB (ref 0.44–1.00)
Calcium: 9.7 mg/dL (ref 8.9–10.3)
GFR calc Af Amer: 52 mL/min — ABNORMAL LOW (ref >60)
GFR, EST NON AFRICAN AMERICAN: 45 mL/min — AB (ref >60)
Glucose, Bld: 142 mg/dL — ABNORMAL HIGH (ref 65–99)
POTASSIUM: 4 mmol/L (ref 3.5–5.1)
Sodium: 140 mmol/L (ref 135–145)

## 2014-12-07 LAB — URINALYSIS, ROUTINE W REFLEX MICROSCOPIC
Bilirubin Urine: NEGATIVE
Glucose, UA: NEGATIVE mg/dL
HGB URINE DIPSTICK: NEGATIVE
KETONES UR: NEGATIVE mg/dL
Nitrite: NEGATIVE
PH: 6 (ref 5.0–8.0)
PROTEIN: NEGATIVE mg/dL
Specific Gravity, Urine: 1.005 (ref 1.005–1.030)
UROBILINOGEN UA: 0.2 mg/dL (ref 0.0–1.0)

## 2014-12-07 LAB — GLUCOSE, CAPILLARY
GLUCOSE-CAPILLARY: 143 mg/dL — AB (ref 65–99)
Glucose-Capillary: 122 mg/dL — ABNORMAL HIGH (ref 65–99)

## 2014-12-07 LAB — TROPONIN I
Troponin I: 0.06 ng/mL — ABNORMAL HIGH (ref <0.031)
Troponin I: 0.05 ng/mL — ABNORMAL HIGH (ref <0.031)

## 2014-12-07 LAB — I-STAT TROPONIN, ED: Troponin i, poc: 0.06 ng/mL (ref 0.00–0.08)

## 2014-12-07 LAB — CBC
HCT: 44.3 % (ref 36.0–46.0)
Hemoglobin: 14.1 g/dL (ref 12.0–15.0)
MCH: 27.9 pg (ref 26.0–34.0)
MCHC: 31.8 g/dL (ref 30.0–36.0)
MCV: 87.7 fL (ref 78.0–100.0)
Platelets: 287 10*3/uL (ref 150–400)
RBC: 5.05 MIL/uL (ref 3.87–5.11)
RDW: 15 % (ref 11.5–15.5)
WBC: 8.3 10*3/uL (ref 4.0–10.5)

## 2014-12-07 LAB — MAGNESIUM: Magnesium: 2.1 mg/dL (ref 1.7–2.4)

## 2014-12-07 LAB — URINE MICROSCOPIC-ADD ON

## 2014-12-07 LAB — TSH: TSH: 1.019 u[IU]/mL (ref 0.350–4.500)

## 2014-12-07 LAB — T4, FREE: Free T4: 1.17 ng/dL — ABNORMAL HIGH (ref 0.61–1.12)

## 2014-12-07 MED ORDER — SODIUM CHLORIDE 0.9 % IV SOLN
Freq: Once | INTRAVENOUS | Status: AC
  Administered 2014-12-07: 10:00:00 via INTRAVENOUS

## 2014-12-07 MED ORDER — OFF THE BEAT BOOK
Freq: Once | Status: AC
  Administered 2014-12-07: 18:00:00
  Filled 2014-12-07: qty 1

## 2014-12-07 MED ORDER — APIXABAN 2.5 MG PO TABS: 2.5000 mg | ORAL_TABLET | Freq: Two times a day (BID) | ORAL | Status: DC

## 2014-12-07 MED ORDER — SODIUM CHLORIDE 0.9 % IV BOLUS (SEPSIS)
500.0000 mL | Freq: Once | INTRAVENOUS | Status: AC
  Administered 2014-12-07: 500 mL via INTRAVENOUS

## 2014-12-07 MED ORDER — POLYVINYL ALCOHOL 1.4 % OP SOLN
1.0000 [drp] | Freq: Two times a day (BID) | OPHTHALMIC | Status: DC
  Administered 2014-12-08: 1 [drp] via OPHTHALMIC
  Filled 2014-12-07: qty 15

## 2014-12-07 MED ORDER — LOSARTAN POTASSIUM 50 MG PO TABS
100.0000 mg | ORAL_TABLET | Freq: Every day | ORAL | Status: DC
Start: 2014-12-07 — End: 2014-12-08
  Administered 2014-12-07 – 2014-12-08 (×2): 100 mg via ORAL
  Filled 2014-12-07 (×2): qty 2

## 2014-12-07 MED ORDER — SODIUM CHLORIDE 0.9 % IV SOLN
INTRAVENOUS | Status: DC
  Administered 2014-12-07 (×2): via INTRAVENOUS

## 2014-12-07 MED ORDER — METOPROLOL TARTRATE 12.5 MG HALF TABLET
12.5000 mg | ORAL_TABLET | Freq: Two times a day (BID) | ORAL | Status: DC
  Administered 2014-12-07: 12.5 mg via ORAL
  Filled 2014-12-07: qty 1

## 2014-12-07 MED ORDER — ACETAMINOPHEN 325 MG PO TABS: 650.0000 mg | ORAL_TABLET | ORAL | Status: DC | PRN

## 2014-12-07 MED ORDER — ACETAMINOPHEN 500 MG PO TABS: 500.0000 mg | ORAL_TABLET | Freq: Every day | ORAL | Status: DC

## 2014-12-07 MED ORDER — ASPIRIN EC 81 MG PO TBEC
81.0000 mg | DELAYED_RELEASE_TABLET | Freq: Every day | ORAL | Status: DC
  Administered 2014-12-07 – 2014-12-08 (×2): 81 mg via ORAL
  Filled 2014-12-07 (×2): qty 1

## 2014-12-07 MED ORDER — METOPROLOL TARTRATE 1 MG/ML IV SOLN
5.0000 mg | Freq: Once | INTRAVENOUS | Status: AC
  Administered 2014-12-07: 5 mg via INTRAVENOUS
  Filled 2014-12-07: qty 5

## 2014-12-07 MED ORDER — INSULIN ASPART 100 UNIT/ML ~~LOC~~ SOLN: 0.0000 [IU] | Freq: Three times a day (TID) | SUBCUTANEOUS | Status: DC

## 2014-12-07 MED ORDER — ONDANSETRON HCL 4 MG/2ML IJ SOLN: 4.0000 mg | Freq: Four times a day (QID) | INTRAMUSCULAR | Status: DC | PRN

## 2014-12-07 MED ORDER — SODIUM CHLORIDE 0.9 % IV SOLN: INTRAVENOUS | Status: AC

## 2014-12-07 MED ORDER — CARBOXYMETHYLCELLULOSE SODIUM 1 % OP SOLN: 1.0000 [drp] | Freq: Two times a day (BID) | OPHTHALMIC | Status: DC

## 2014-12-07 MED ORDER — DILTIAZEM LOAD VIA INFUSION
10.0000 mg | Freq: Once | INTRAVENOUS | Status: AC
  Administered 2014-12-07: 10 mg via INTRAVENOUS
  Filled 2014-12-07: qty 10

## 2014-12-07 MED ORDER — ATORVASTATIN CALCIUM 40 MG PO TABS
40.0000 mg | ORAL_TABLET | Freq: Every day | ORAL | Status: DC
  Administered 2014-12-07 – 2014-12-08 (×2): 40 mg via ORAL
  Filled 2014-12-07 (×3): qty 1

## 2014-12-07 MED ORDER — DILTIAZEM HCL 100 MG IV SOLR
5.0000 mg/h | INTRAVENOUS | Status: DC
  Administered 2014-12-07: 5 mg/h via INTRAVENOUS

## 2014-12-07 NOTE — ED Notes (Signed)
Portable xray at bedside.

## 2014-12-07 NOTE — ED Provider Notes (Signed)
CSN: [id number]     Arrival date & time 12/07/14  3151 History   First MD Initiated Contact with Patient 12/07/14 0827     Chief Complaint  Patient presents with  . Tachycardia  . Weakness     (Consider location/radiation/quality/duration/timing/severity/associated sxs/prior Treatment) HPI   [name] Small is a 81 y.o. female who states that she woke up this morning with a sensation of rapid heartbeat, and feeling sweaty. She was able to ambulate without dizziness. She denies chest pain, shortness of breath, nausea or vomiting. She has had urinary frequency, yesterday, without dysuria. She is taking her usual medications. She had episodes of rapid heartbeat, during a rehabilitation stay, in February 2016, saw a cardiologist, but no interventions or diagnosis were made. There are no other known modifying factors.   Past Medical History  Diagnosis Date  . DIVERTICULOSIS, COLON 11/22/2006  . HYPERLIPIDEMIA 11/22/2006  . OSTEOPOROSIS 11/22/2006  . Menopausal syndrome (hot flashes)   . Headache(784.0)   . Cancer Meloanoma left leg  . Hypertension    Past Surgical History  Procedure Laterality Date  . Appendectomy    . Cholecystectomy    . Bunionectomy    . Femur im nail Left 07/08/2014    Procedure: INTRAMEDULLARY (IM) NAIL ;  Surgeon: Meredith Pel, MD;  Location: [facility];  Service: Orthopedics;  Laterality: Left;   Family History  Problem Relation Age of Onset  . Alzheimer's disease Sister   . Cardiomyopathy Brother    History  Substance Use Topics  . Smoking status: Never Smoker   . Smokeless tobacco: Never Used  . Alcohol Use: No   OB History    No data available     Review of Systems  All other systems reviewed and are negative.     Allergies  Lisinopril  Home Medications   Prior to Admission medications   Medication Sig Start Date End Date Taking? Authorizing Provider  acetaminophen (TYLENOL) 500 MG tablet Take 500 mg by mouth daily.   Yes Historical Provider,  MD  aspirin 81 MG tablet Take 81 mg by mouth daily.   Yes Historical Provider, MD  atorvastatin (LIPITOR) 40 MG tablet Take 1 tablet (40 mg total) by mouth daily. 04/09/14  Yes Marletta Lor, MD  Calcium Carbonate-Vitamin D (CALTRATE 600+D PO) Take 1 tablet by mouth daily.     Yes Historical Provider, MD  losartan (COZAAR) 100 MG tablet Take 1 tablet (100 mg total) by mouth daily. 04/09/14  Yes Marletta Lor, MD  Polyvinyl Alcohol-Povidone (REFRESH OP) Place 1 drop into both eyes 2 (two) times daily.   Yes Historical Provider, MD   BP 125/82 mmHg  Pulse 95  Temp(Src) 97.8 F (36.6 C) (Oral)  Resp 17  Ht 5\' 5"  (1.651 m)  Wt 130 lb (58.968 kg)  BMI 21.63 kg/m2  SpO2 100% Physical Exam  Constitutional: She is oriented to person, place, and time. She appears well-developed.  Elderly, frail  HENT:  Head: Normocephalic and atraumatic.  Right Ear: External ear normal.  Left Ear: External ear normal.  Eyes: Conjunctivae and EOM are normal. Pupils are equal, round, and reactive to light.  Neck: Normal range of motion and phonation normal. Neck supple.  Cardiovascular: Normal heart sounds.   Irregular tachycardia.   Pulmonary/Chest: Effort normal and breath sounds normal. She exhibits no bony tenderness.  Abdominal: Soft. There is no tenderness.  Musculoskeletal: Normal range of motion. She exhibits no edema or tenderness.  Neurological: She is  alert and oriented to person, place, and time. No cranial nerve deficit or sensory deficit. She exhibits normal muscle tone. Coordination normal.  Skin: Skin is warm, dry and intact.  Psychiatric: She has a normal mood and affect. Her behavior is normal. Judgment and thought content normal.  Nursing note and vitals reviewed.   ED Course  Procedures (including critical care time)  Medications  sodium chloride 0.9 % bolus 500 mL (0 mLs Intravenous Stopped 12/07/14 0930)  metoprolol (LOPRESSOR) injection 5 mg (5 mg Intravenous Given  12/07/14 0952)  0.9 %  sodium chloride infusion ( Intravenous New Bag/Given 12/07/14 0952)    Patient Vitals for the past 24 hrs:  BP Temp Temp src Pulse Resp SpO2 Height Weight  12/07/14 1045 - - - - 17 100 % - -  12/07/14 1015 125/82 mmHg - - 95 15 100 % - -  12/07/14 1000 117/79 mmHg - - - 22 96 % - -  12/07/14 0953 138/88 mmHg - - - 25 92 % - -  12/07/14 0945 125/69 mmHg - - - 23 97 % - -  12/07/14 0927 138/69 mmHg - - - 22 98 % - -  12/07/14 0915 108/56 mmHg - - - 22 99 % - -  12/07/14 0835 141/90 mmHg 97.8 F (36.6 C) Oral (!) 150 20 100 % 5\' 5"  (1.651 m) 130 lb (58.968 kg)  12/07/14 0824 - - - - - - 5\' 5"  (1.651 m) 130 lb (58.968 kg)    9:44 AM Reevaluation with update and discussion. After initial assessment and treatment, an updated evaluation reveals she remains comfortable and is drinking fluid. HR still elevated 125-140. Lopressor ordered. [name]   This patients CHA2DS2-VASc Score and unadjusted Ischemic Stroke Rate (% per year) is equal to 4.8 % stroke rate/year from a score of 4  Above score calculated as 1 point each if present [CHF, HTN, DM, Vascular=MI/PAD/Aortic Plaque, Age if 65-74, or Female] Above score calculated as 2 points each if present [Age > 75, or Stroke/TIA/TE]   CRITICAL CARE Performed by: Daleen Bo L Total critical care time: 35 min. Critical care time was exclusive of separately billable procedures and treating other patients. Critical care was necessary to treat or prevent imminent or life-threatening deterioration. Critical care was time spent personally by me on the following activities: development of treatment plan with patient and/or surrogate as well as nursing, discussions with consultants, evaluation of patient's response to treatment, examination of patient, obtaining history from patient or surrogate, ordering and performing treatments and interventions, ordering and review of laboratory studies, ordering and review of radiographic  studies, pulse oximetry and re-evaluation of patient's condition.   Labs Review Labs Reviewed  BASIC METABOLIC PANEL - Abnormal; Notable for the following:    Glucose, Bld 142 (*)    Creatinine, Ser 1.11 (*)    GFR calc non Af Amer 45 (*)    GFR calc Af Amer 52 (*)    All other components within normal limits  URINE CULTURE  CBC  URINALYSIS, ROUTINE W REFLEX MICROSCOPIC (NOT AT [facility])  Randolm Idol, ED    Imaging Review Dg Chest Port 1 View  12/07/2014   CLINICAL DATA:  Diaphoresis upon waking this morning. Sensation of tachycardia.  EXAM: PORTABLE CHEST - 1 VIEW  COMPARISON:  07/08/2014  FINDINGS: Artifact overlies the chest. Heart size is normal. There is atherosclerosis of the aorta. The lungs are clear. The pulmonary vascularity is normal. No effusions. No acute bone finding.  IMPRESSION: No active disease   Electronically Signed   By: Nelson Chimes M.D.   On: 12/07/2014 08:51     EKG Interpretation   Date/Time:  Monday December 07 2014 08:30:35 EDT Ventricular Rate:  161 PR Interval:    QRS Duration: 76 QT Interval:  292 QTC Calculation: 478 R Axis:   -38 Text Interpretation:  Atrial fibrillation with rapid V-rate Left axis  deviation Abnormal R-wave progression, early transition Repolarization  abnormality, prob rate related Since last tracing Atrial fibrillation is  new and rate is faster Confirmed by Tania Steinhauser  MD, Xiamara Hulet ([id number]) on  12/07/2014 8:41:00 AM      MDM   Final diagnoses:  Atrial fibrillation with rapid ventricular response    Atrial fibrillation, new onset, without clear cause. Rapid ventricular rate. Required treatment with IV fluids, and Lopressor. Initial troponin is normal. Doubt ACS, PE, pneumonia, metabolic instability. She may have mild dehydration associated with elevated creatinine, from her baseline. Urinalysis with white cells, but no bacteria and nitrite negative. Urine culture is pending. She had previous evaluation by cardiology Johnsie Cancel) for  episodes of irregular heartbeat. Apparently, with tachycardia. At that time, in May 2016, she was in sinus bradycardia. She was transiently treated for palpitations and possibly to fibrillation, with metoprolol but is off it at this time. She has had a cardiac echo recently which showed normal ejection fraction. Chads risk is elevated, indicating that she needs to have anticoagulation. She was symptomatic today with rapid heartbeat and uncontrolled atrial fibrillation. She will require admission for further observation and treatment.  Nursing Notes Reviewed/ Care Coordinated Applicable Imaging Reviewed Interpretation of Laboratory Data incorporated into ED treatment   Plan: Admit    Daleen Bo, MD 12/07/14 1643

## 2014-12-07 NOTE — Progress Notes (Signed)
Pt started on cardizem gtt, with bolus. HR down to 65. Notified PA, turned cardizem gtt off

## 2014-12-07 NOTE — ED Notes (Signed)
Pt reports waking up this morning with diaphoresis and feeling like her heart was "running away."

## 2014-12-07 NOTE — Progress Notes (Signed)
Pt concerned with taking Insulin. RN educated patient. Pt states she will address it tomorrow with MD

## 2014-12-07 NOTE — ED Notes (Signed)
Pt attempted to use bedpan, no UA sample Pt drinking water

## 2014-12-07 NOTE — Consult Note (Signed)
Patient ID: [name] Small MRN: [id number], DOB/AGE: 07/27/1928   Admit date: 12/07/2014   Primary Physician: Nyoka Cowden, MD Primary Cardiologist: Dr. Johnsie Cancel   Pt. Profile:  81 y/o female with h/o HTN, HLD, bradycardia and palpitations, presenting to the ED with tachy palpitations and found to be in atrial fibrillation w/ RVR. There had been question of atrial fibrillation in the past. When the patient was seen by Dr. Johnsie Cancel earlier this year, he was concerned about the possibility of sick sinus syndrome. The patient had been on Coumadin after a fractured hip. Drs. Johnsie Cancel and [place] spoke and agreed that she would not be kept on Coumadin. It was also mentioned that the patient preferred not to be on Coumadin.  She presented today with rapid atrial fib.  Problem List  Past Medical History  Diagnosis Date  . DIVERTICULOSIS, COLON 11/22/2006  . HYPERLIPIDEMIA 11/22/2006  . OSTEOPOROSIS 11/22/2006  . Menopausal syndrome (hot flashes)   . Headache(784.0)   . Cancer Meloanoma left leg  . Hypertension     Past Surgical History  Procedure Laterality Date  . Appendectomy    . Cholecystectomy    . Bunionectomy    . Femur im nail Left 07/08/2014    Procedure: INTRAMEDULLARY (IM) NAIL ;  Surgeon: Meredith Pel, MD;  Location: [facility];  Service: Orthopedics;  Laterality: Left;     Allergies  Allergies  Allergen Reactions  . Lisinopril     Cough    HPI  81 y/o female followed by Dr. Johnsie Cancel. Dr. Burnice Logan follower her medically. Her history is notable for HTN and HLD. She has a h/o palpitations but no prior documentation of atrial fibrillation. She has had documented bradycardia and has required discontinuation of BB therapy. She had been on coumadin for a period of time for DVT prophylaxis following hip surgery. However, given her advanced age and fall risk, decision was made not to continue coumadin long term. She had a 2D echo 09/2011 that showed normal LV systolic  function with an EF of 65-70%. Grade II DD was noted but no wall motion abnormalities and no significant valvular abnormalities. Her last OV with Dr. Johnsie Cancel was 11/04/14 and she was felt to be stable from a cardiac standpoint and she was instructed to f/u in 3 months.   She presents to [facility] ED today with complaints of tachy palpitations with associated weakness and diaphoresis. Sudden onset which started this am. No chest pain or dyspnea. She also notes recent h/o increased urinary frequency w/o dysuria or pelvic pain.   EKG on arrival showed atrial fibrillation with RVR of 161 bpm. Labs are only notable for renal insufficiency with SCr of 1.11 (baseline 0.09). K is WNL at 4.0. She is afebrile. WBC and H/H all normal. CXR is unremarkable w/o signs of edema or infiltrate. U/A shows moderate amount of leukocytes. Urine culture pending. Mg and TSH also pending.   In ER, she received 5 mg of IV Lopressor and started on IVFs. She continues in afib with rate now in the 120s-130s. She is being admitted by IM.      Home Medications  Prior to Admission medications   Medication Sig Start Date End Date Taking? Authorizing Provider  acetaminophen (TYLENOL) 500 MG tablet Take 500 mg by mouth daily.   Yes Historical Provider, MD  aspirin 81 MG tablet Take 81 mg by mouth daily.   Yes Historical Provider, MD  atorvastatin (LIPITOR) 40 MG tablet Take 1 tablet (40  mg total) by mouth daily. 04/09/14  Yes Marletta Lor, MD  Calcium Carbonate-Vitamin D (CALTRATE 600+D PO) Take 1 tablet by mouth daily.     Yes Historical Provider, MD  losartan (COZAAR) 100 MG tablet Take 1 tablet (100 mg total) by mouth daily. 04/09/14  Yes Marletta Lor, MD  Polyvinyl Alcohol-Povidone (REFRESH OP) Place 1 drop into both eyes 2 (two) times daily.   Yes Historical Provider, MD    Family History  Family History  Problem Relation Age of Onset  . Alzheimer's disease Sister   . Cardiomyopathy Brother     Social  History  History   Social History  . Marital Status: Widowed    Spouse Name: N/A  . Number of Children: N/A  . Years of Education: N/A   Occupational History  . Not on file.   Social History Main Topics  . Smoking status: Never Smoker   . Smokeless tobacco: Never Used  . Alcohol Use: No  . Drug Use: No  . Sexual Activity: No   Other Topics Concern  . Not on file   Social History Narrative     Review of Systems General:  No chills, fever, night sweats or weight changes.  Cardiovascular:  No chest pain, dyspnea on exertion, edema, orthopnea, palpitations, paroxysmal nocturnal dyspnea. Dermatological: No rash, lesions/masses Respiratory: No cough, dyspnea Urologic: No hematuria, dysuria Abdominal:   No nausea, vomiting, diarrhea, bright red blood per rectum, melena, or hematemesis Neurologic:  No visual changes, wkns, changes in mental status. All other systems reviewed and are otherwise negative except as noted above.  Physical Exam  Blood pressure 128/85, pulse 95, temperature 97.8 F (36.6 C), temperature source Oral, resp. rate 17, height 5\' 5"  (1.651 m), weight 130 lb (58.968 kg), SpO2 99 %.  General: Pleasant, NAD Psych: Normal affect. Neuro: Alert and oriented X 3. Moves all extremities spontaneously. HEENT: Normal  Neck: Supple without bruits or JVD. Lungs:  Resp regular and unlabored, CTA. Heart: irregularly irregular no s3, s4, or murmurs. Abdomen: Soft, non-tender, non-distended, BS + x 4.  Extremities: No clubbing, cyanosis or edema. DP/PT/Radials 2+ and equal bilaterally.  Labs  Troponin [facility] Test)  Recent Labs  12/07/14 0844  TROPIPOC 0.06   No results for input(s): CKTOTAL, CKMB, TROPONINI in the last 72 hours. Lab Results  Component Value Date   WBC 8.3 12/07/2014   HGB 14.1 12/07/2014   HCT 44.3 12/07/2014   MCV 87.7 12/07/2014   PLT 287 12/07/2014    Recent Labs Lab 12/07/14 0838  NA 140  K 4.0  CL 106  CO2 24  BUN 6    CREATININE 1.11*  CALCIUM 9.7  GLUCOSE 142*   Lab Results  Component Value Date   CHOL 277* 04/07/2014   HDL 62.00 04/07/2014   LDLCALC 192* 04/07/2014   TRIG 114.0 04/07/2014   No results found for: DDIMER   Radiology/Studies  Dg Chest Port 1 View  12/07/2014   CLINICAL DATA:  Diaphoresis upon waking this morning. Sensation of tachycardia.  EXAM: PORTABLE CHEST - 1 VIEW  COMPARISON:  07/08/2014  FINDINGS: Artifact overlies the chest. Heart size is normal. There is atherosclerosis of the aorta. The lungs are clear. The pulmonary vascularity is normal. No effusions. No acute bone finding.  IMPRESSION: No active disease   Electronically Signed   By: Nelson Chimes M.D.   On: 12/07/2014 08:51    ECG  Atrial fibrillation w/ RVR 161 bpm  ASSESSMENT AND PLAN  Principal Problem:   Atrial fibrillation with RVR Active Problems:   Dyslipidemia   Dehydration   Overactive bladder   Essential hypertension   Hyperglycemia   Atrial fibrillation with rapid ventricular response   1. Atrial Fibrillation w/ RVR:  HR still elevated in the 120s-130s. She has h/o bradycardia on BBs in the past. Recommend initiation of IV Cardizem for rate control. Discontinue if significant bradycardia. Check Mg, TSH and 2D echo. F/u urine studies. Her CHA2DS2 VASc score is at least 4 (HTN, Age >53 and female sex). However, given her advanced age and  fall risk, her coumadin was discontinued 06/2014. We will monitor her response to rate control therapy. We will use diltiazem for rate control. Beta blocker cannot be used because of history of prior sinus bradycardia. The patient has sick sinus syndrome. At this point there is no definite indication for pacemaker. She has not had syncope.. We will continue to keep her off anticoagulation. I have an additional concern added to the prior ones that she could possibly have a syncopal episode if she has a long pause when converting from atrial flutter back to sinus rhythm.  This issue can be readdressed by Dr. Johnsie Cancel when he sees her back as an outpatient in the future.  Signed, SIMMONS, BRITTAINY, PA-C 12/07/2014, 1:16 PM Patient seen and examined. I agree with the assessment and plan as detailed above. See also my additional thoughts below.   I spoke with the patient and family in the emergency room. I reviewed old records. I have modified the note above with my own additions. We will use diltiazem for rate control. We will have to follow her rhythm to see if she has significant bradycardia tachycardia problems. At this point she does not need a pacemaker. We will not add anticoagulation at this time. That issue can be reconsidered in the future when the patient is seen by Dr. Johnsie Cancel as an outpatient.  Dola Argyle, MD, [facility] 12/07/2014 2:46 PM

## 2014-12-07 NOTE — Evaluation (Signed)
Physical Therapy Evaluation Patient Details Name: [name] Small MRN: [id number] DOB: 08-17-1928 Today's Date: 12/07/2014   History of Present Illness  81 y.o. female admitted from [facility] on 12/07/14 with A fib with RVR.  Pt with significant PMHx of diverticulosis, osteoporosis, CA (melanoma of L leg) HTN, L femur IM nail s/p fall in Feb of 2016.  Clinical Impression  Pt is mobilizing at her baseline. HR 90s-100s during gait except for one brief moment of 140 and then immediately back to 90s.  She would continue to benefit from acute PT for activity tolerance (HR monitoring during mobility).  She will likely not need PT f/u at discharge.      Follow Up Recommendations No PT follow up    Equipment Recommendations  None recommended by PT    Recommendations for Other Services   NA    Precautions / Restrictions Precautions Precautions: Fall Precaution Comments: h/o falls Restrictions Weight Bearing Restrictions: No      Mobility  Bed Mobility Overal bed mobility: Modified Independent             General bed mobility comments: HOB elevated and using bed rail for leverage  Transfers Overall transfer level: Modified independent Equipment used: Straight cane             General transfer comment: using straight cane and bed rail  Ambulation/Gait Ambulation/Gait assistance: Min guard Ambulation Distance (Feet): 150 Feet Assistive device: Straight cane Gait Pattern/deviations: Step-through pattern;Antalgic Gait velocity: decreased Gait velocity interpretation: <1.8 ft/sec, indicative of risk for recurrent falls General Gait Details: mildly antalgic gait pattern, very slow speed         Balance Overall balance assessment: Needs assistance Sitting-balance support: Feet supported;No upper extremity supported Sitting balance-Leahy Scale: Good     Standing balance support: Single extremity supported Standing balance-Leahy Scale: Good                              Pertinent Vitals/Pain Pain Assessment: No/denies pain    Home Living Family/patient expects to be discharged to:: Private residence Living Arrangements: Alone Available Help at Discharge: Family;Available PRN/intermittently Type of Home: House Home Access: Stairs to enter Entrance Stairs-Rails: Left Entrance Stairs-Number of Steps: 4 Home Layout: One level Home Equipment: Cane - single point;Walker - 2 wheels      Prior Function Level of Independence: Independent with assistive device(s)         Comments: pt uses cane during the day at home and RW at night when she gets up to go to the bathroom.  Family lives next door and check on her regularly.  She still drives.         Extremity/Trunk Assessment   Upper Extremity Assessment: Overall WFL for tasks assessed           Lower Extremity Assessment: LLE deficits/detail   LLE Deficits / Details: left leg still showing mild functional deficits as a result of recent hip fx surgery.  Mildly abnormal gait pattern.   Cervical / Trunk Assessment: Normal  Communication   Communication: No difficulties  Cognition Arousal/Alertness: Awake/alert Behavior During Therapy: WFL for tasks assessed/performed Overall Cognitive Status: Within Functional Limits for tasks assessed                      General Comments General comments (skin integrity, edema, etc.): HR mostly stayed upper 90s low 100s except for one breif moment the monitor registered 140  and then immediately went back to 90s.            Assessment/Plan    PT Assessment Patient needs continued PT services  PT Diagnosis Abnormality of gait;Difficulty walking   PT Problem List Decreased strength;Decreased activity tolerance;Decreased balance;Cardiopulmonary status limiting activity  PT Treatment Interventions DME instruction;Gait training;Stair training;Functional mobility training;Therapeutic activities;Balance training;Therapeutic  exercise;Neuromuscular re-education;Patient/family education   PT Goals (Current goals can be found in the Care Plan section) Acute Rehab PT Goals Patient Stated Goal: to go home tomorrow if she is allowed PT Goal Formulation: With patient Time For Goal Achievement: 12/21/14 Potential to Achieve Goals: Good    Frequency Min 3X/week    End of Session   Activity Tolerance: Patient tolerated treatment well Patient left: in bed (seated EOB) Nurse Communication: Mobility status;Other (comment) (HR)         Time: 8403-7543 PT Time Calculation (min) (ACUTE ONLY): 15 min   Charges:   PT Evaluation $Initial PT Evaluation Tier I: 1 Procedure          [name] Small, [name] Small, DPT [phone]   12/07/2014, 5:05 PM

## 2014-12-07 NOTE — Progress Notes (Signed)
Utilization Review Completed.Donne Anon T7/18/2016

## 2014-12-07 NOTE — H&P (Signed)
[facility] History and Physical  BARBEE MAMULA PJA:[id number] DOB: 09-02-1928 DOA: 12/07/2014  Referring physician: Dr. Eulis Foster PCP: Nyoka Cowden, MD   Chief Complaint: Fast heart rate  HPI: [name] Small is a 81 y.o. female HTN, HLD, overactive bladder and osteoporosis presented to the ED due to an episode of feeling hot, sweaty and weak with a fast heart rate. The episode began at 0730 this morning and persisted until she receiving medicine in the ED. Per daughter-in-law, patient was also pale during this episode. Patient says she felt normal when she went to sleep last night. Patient denies nausea, vomiting, dizziness, light-headedness, confusion, vision changes. No orthopnea, PND, DOE, SOB or chest pain. She also reports persistent urinary frequency and feeling dehydrated. She has been eating and drinking normally, with some decreased appetite and has been feeling more tired than usual. She experienced an episode similar to this is Feb 2016 and was taking metoprolol at that time. She saw a cardiologist, no interventions were made at that time. The metoprolol was discontinued in May 2016 due to bradycardia. Patient had a hip fracture in Feb 2016 and was placed on coumadin for DVT prophylaxis, but is currently on only aspirin -not anti-coagulants.  In the ED, patient was given metoprolol that resolved her symptoms and IVF. EKG demonstrated atrial fibrillation with RVR. POC Trop negative. Labwork includes an elevated Cr at 1.11, other lab work unremarkable. CXR unremarkable. Patient is currently asymptomatic and is tachycardic in atrial fibrillation.   Echo performed 10/03/11 with EF 34-19%, grade 2 diastolic dysfunction.    Review of Systems:  Constitutional: No weight loss, night sweats, Fevers, chills, fatigue. ++weakness, diaphoresis HEENT: No headaches, Difficulty swallowing,Tooth/dental problems,Sore throat,  No sneezing, itching, ear ache, nasal congestion, post nasal drip,    Cardio-vascular:No chest pain, Orthopnea, PND, swelling in lower extremities, anasarca, dizziness, ++palpitations  GI: No heartburn, indigestion, abdominal pain, nausea, vomiting, diarrhea, change in bowel habits, ++ decreased appetite  Resp: No shortness of breath with exertion or at rest. No excess mucus, no productive cough, No non-productive cough, No coughing up of blood.No change in color of mucus.No wheezing.No chest wall deformity  Skin: no rash or lesions.  GU: ++ urinary frequency, no dysuria, change in color of urine, no urgency,No flank pain.  Musculoskeletal: No joint pain or swelling. No decreased range of motion. No back pain.  Psych: No change in mood or affect. No depression or anxiety. No memory loss.   Past Medical History  Diagnosis Date  . DIVERTICULOSIS, COLON 11/22/2006  . HYPERLIPIDEMIA 11/22/2006  . OSTEOPOROSIS 11/22/2006  . Menopausal syndrome (hot flashes)   . Headache(784.0)   . Cancer Meloanoma left leg  . Hypertension    Past Surgical History  Procedure Laterality Date  . Appendectomy    . Cholecystectomy    . Bunionectomy    . Femur im nail Left 07/08/2014    Procedure: INTRAMEDULLARY (IM) NAIL ;  Surgeon: Meredith Pel, MD;  Location: [facility];  Service: Orthopedics;  Laterality: Left;   Social History:  reports that she has never smoked. She has never used smokeless tobacco. She reports that she does not drink alcohol or use illicit drugs.   Patient lives at home by herself. She uses a cane to walk due to L hip fracture in Feb 2016. She is able to cook and clean for herself. She does drive. She has immediately family that lives in close proximity to her.   Allergies  Allergen Reactions  .  Lisinopril     Cough    Family History  Problem Relation Age of Onset  . Alzheimer's disease Sister   . Cardiomyopathy Brother   Brother MI with open heart surgery when he was 73.  Prior to Admission medications   Medication Sig Start Date End Date Taking?  Authorizing Provider  acetaminophen (TYLENOL) 500 MG tablet Take 500 mg by mouth daily.   Yes Historical Provider, MD  aspirin 81 MG tablet Take 81 mg by mouth daily.   Yes Historical Provider, MD  atorvastatin (LIPITOR) 40 MG tablet Take 1 tablet (40 mg total) by mouth daily. 04/09/14  Yes Marletta Lor, MD  Calcium Carbonate-Vitamin D (CALTRATE 600+D PO) Take 1 tablet by mouth daily.     Yes Historical Provider, MD  losartan (COZAAR) 100 MG tablet Take 1 tablet (100 mg total) by mouth daily. 04/09/14  Yes Marletta Lor, MD  Polyvinyl Alcohol-Povidone (REFRESH OP) Place 1 drop into both eyes 2 (two) times daily.   Yes Historical Provider, MD   Physical Exam: Filed Vitals:   12/07/14 1045 12/07/14 1115 12/07/14 1130 12/07/14 1142  BP:  124/70 133/80   Pulse:      Temp:      TempSrc:      Resp: 17 20 19 16   Height:      Weight:      SpO2: 100% 96% 99% 100%    Wt Readings from Last 3 Encounters:  12/07/14 58.968 kg (130 lb)  11/04/14 59.149 kg (130 lb 6.4 oz)  10/01/14 60.782 kg (134 lb)    General:  Appears calm and comfortable laying supine in bed. She is pleasant and in no distress with no difficulty breathing. Her color is normal.  Eyes: PERRL, normal lids, irises & conjunctiva ENT: grossly normal hearing, lips & tongue Neck: no LAD, masses or thyromegaly, no JVD Cardiovascular: Irregular rhythm, no m/r/g. No LE edema. 2+ DP/PT pulses. Respiratory: CTA bilaterally, no w/r/r. Normal respiratory effort. Abdomen: Bowel sounds present, soft, ntnd Skin: no rash or induration seen on limited exam Musculoskeletal: grossly normal tone BUE/BLE, 5/5 strength BUE. Psychiatric: grossly normal mood and affect, speech fluent and appropriate Neurologic: grossly non-focal.          Labs on Admission:  Basic Metabolic Panel:  Recent Labs Lab 12/07/14 0838  NA 140  K 4.0  CL 106  CO2 24  GLUCOSE 142*  BUN 6  CREATININE 1.11*  CALCIUM 9.7    CBC:  Recent  Labs Lab 12/07/14 0838  WBC 8.3  HGB 14.1  HCT 44.3  MCV 87.7  PLT 287   Radiological Exams on Admission: Dg Chest Port 1 View  12/07/2014   CLINICAL DATA:  Diaphoresis upon waking this morning. Sensation of tachycardia.  EXAM: PORTABLE CHEST - 1 VIEW  COMPARISON:  07/08/2014  FINDINGS: Artifact overlies the chest. Heart size is normal. There is atherosclerosis of the aorta. The lungs are clear. The pulmonary vascularity is normal. No effusions. No acute bone finding.  IMPRESSION: No active disease   Electronically Signed   By: Nelson Chimes M.D.   On: 12/07/2014 08:51    EKG: Independently reviewed. Atrial fibrillation at 161.  Assessment/Plan Principal Problem:   Atrial fibrillation with RVR Active Problems:   Dyslipidemia   Dehydration   Overactive bladder   Essential hypertension   Hyperglycemia   1. Atrial fibrillation with RVR - Rapid heart rate that persisted until metoprolol was given  - CHADS-VASC 4-5. Anticoagulation indicated. Will  start on Eliquis per pharmacy.  - Cycle troponin, Check Mag, - Check TSH and free T4 for any thyroid dysfunction that may contribute to tachycardia - start Eliquis at 2.5 mg bid, and metoprolol.  Pharmacy to review dose and educate. - Cardiology consult requested. - Admit to telemtry  2. HTN - Blood pressure controlled at 133/80t - Continue losartan, added metoprolol 12.5 bid  3. Hyperlipidemia - Continue atorvastatin - Heart healthy diet  4. Hyperglycemia - No personal history of diabetes - Check A1C due to elevated CBG at 141 and polyuria - Will add SSI - sensitive.  5. Overactive bladder - Persistent urinary frequency and feeling of being dehydrated - Urine unremarkable, moderate leukocytes, negative for glucose and protein   Code Status: Full DVT Prophylaxis: Eliquis Family Communication: Patient and daughter-in-law Disposition Plan: Admit to telemetry  Time spent: [address] PA-S Imogene Burn, [place] [facility] Pager [phone]

## 2014-12-08 ENCOUNTER — Other Ambulatory Visit: Payer: Self-pay | Admitting: Cardiology

## 2014-12-08 ENCOUNTER — Inpatient Hospital Stay (HOSPITAL_COMMUNITY): Payer: Medicare Other

## 2014-12-08 ENCOUNTER — Ambulatory Visit (INDEPENDENT_AMBULATORY_CARE_PROVIDER_SITE_OTHER): Payer: Medicare Other

## 2014-12-08 DIAGNOSIS — I4891 Unspecified atrial fibrillation: Secondary | ICD-10-CM

## 2014-12-08 DIAGNOSIS — R739 Hyperglycemia, unspecified: Secondary | ICD-10-CM

## 2014-12-08 DIAGNOSIS — I10 Essential (primary) hypertension: Secondary | ICD-10-CM

## 2014-12-08 DIAGNOSIS — I495 Sick sinus syndrome: Secondary | ICD-10-CM

## 2014-12-08 DIAGNOSIS — R7989 Other specified abnormal findings of blood chemistry: Secondary | ICD-10-CM

## 2014-12-08 DIAGNOSIS — R778 Other specified abnormalities of plasma proteins: Secondary | ICD-10-CM | POA: Diagnosis present

## 2014-12-08 DIAGNOSIS — E785 Hyperlipidemia, unspecified: Secondary | ICD-10-CM

## 2014-12-08 LAB — BASIC METABOLIC PANEL
ANION GAP: 4 — AB (ref 5–15)
BUN: 14 mg/dL (ref 6–20)
CALCIUM: 8.7 mg/dL — AB (ref 8.9–10.3)
CO2: 25 mmol/L (ref 22–32)
Chloride: 110 mmol/L (ref 101–111)
Creatinine, Ser: 1.06 mg/dL — ABNORMAL HIGH (ref 0.44–1.00)
GFR calc Af Amer: 55 mL/min — ABNORMAL LOW (ref >60)
GFR, EST NON AFRICAN AMERICAN: 47 mL/min — AB (ref >60)
GLUCOSE: 104 mg/dL — AB (ref 65–99)
Potassium: 3.9 mmol/L (ref 3.5–5.1)
SODIUM: 139 mmol/L (ref 135–145)

## 2014-12-08 LAB — GLUCOSE, CAPILLARY
GLUCOSE-CAPILLARY: 101 mg/dL — AB (ref 65–99)
Glucose-Capillary: 88 mg/dL (ref 65–99)

## 2014-12-08 LAB — HEMOGLOBIN A1C
Hgb A1c MFr Bld: 6.1 % — ABNORMAL HIGH (ref 4.8–5.6)
Mean Plasma Glucose: 128 mg/dL

## 2014-12-08 LAB — TROPONIN I: TROPONIN I: 0.05 ng/mL — AB (ref <0.031)

## 2014-12-08 MED ORDER — APIXABAN 2.5 MG PO TABS: 2.5000 mg | ORAL_TABLET | Freq: Two times a day (BID) | ORAL | Status: DC

## 2014-12-08 MED ORDER — METOPROLOL TARTRATE 25 MG PO TABS: 12.5000 mg | ORAL_TABLET | Freq: Two times a day (BID) | ORAL | Status: DC

## 2014-12-08 MED ORDER — CARVEDILOL 3.125 MG PO TABS: 3.1250 mg | ORAL_TABLET | Freq: Two times a day (BID) | ORAL | Status: DC

## 2014-12-08 NOTE — Progress Notes (Signed)
  Echocardiogram 2D Echocardiogram has been performed.  Basilia Jumbo 12/08/2014, 2:38 PM

## 2014-12-08 NOTE — Progress Notes (Signed)
Subjective:  Up in bed, no complaints, anxious to go home  Objective:  Vital Signs in the last 24 hours: Temp:  [97.7 F (36.5 C)-98.2 F (36.8 C)] 97.9 F (36.6 C) (07/19 0338) Pulse Rate:  [51-140] 51 (07/19 0338) Resp:  [16-28] 16 (07/19 0338) BP: (104-162)/(49-93) 162/58 mmHg (07/19 0338) SpO2:  [96 %-100 %] 99 % (07/19 0338) Weight:  [129 lb 3.2 oz (58.605 kg)-130 lb 9.6 oz (59.24 kg)] 130 lb 9.6 oz (59.24 kg) (07/19 0338)  Intake/Output from previous day:  Intake/Output Summary (Last 24 hours) at 12/08/14 1110 Last data filed at 12/08/14 0807  Gross per 24 hour  Intake 1197.5 ml  Output      2 ml  Net 1195.5 ml    Physical Exam: General appearance: alert, cooperative, no distress and pale Neck: no JVD Lungs: clear to auscultation bilaterally Heart: regular rate and rhythm Extremities: no edema   Rate: 45-60  Rhythm: normal sinus rhythm and sinus bradycardia  Lab Results:  Recent Labs  12/07/14 0838  WBC 8.3  HGB 14.1  PLT 287    Recent Labs  12/07/14 0838 12/08/14 0249  NA 140 139  K 4.0 3.9  CL 106 110  CO2 24 25  GLUCOSE 142* 104*  BUN 6 14  CREATININE 1.11* 1.06*    Recent Labs  12/07/14 2038 12/08/14 0249  TROPONINI 0.05* 0.05*   No results for input(s): INR in the last 72 hours.  Scheduled Meds: . acetaminophen  500 mg Oral Daily  . aspirin EC  81 mg Oral Daily  . atorvastatin  40 mg Oral Daily  . insulin aspart  0-9 Units Subcutaneous TID WC  . losartan  100 mg Oral Daily  . metoprolol tartrate  12.5 mg Oral BID  . polyvinyl alcohol  1 drop Both Eyes BID   Continuous Infusions: . sodium chloride 50 mL/hr at 12/07/14 2133  . diltiazem (CARDIZEM) infusion Stopped (12/07/14 1543)   PRN Meds:.acetaminophen, ondansetron (ZOFRAN) IV   Imaging: Dg Chest Port 1 View  12/07/2014   CLINICAL DATA:  Diaphoresis upon waking this morning. Sensation of tachycardia.  EXAM: PORTABLE CHEST - 1 VIEW  COMPARISON:  07/08/2014  FINDINGS:  Artifact overlies the chest. Heart size is normal. There is atherosclerosis of the aorta. The lungs are clear. The pulmonary vascularity is normal. No effusions. No acute bone finding.  IMPRESSION: No active disease   Electronically Signed   By: Nelson Chimes M.D.   On: 12/07/2014 08:51    Cardiac Studies: Echo 12/08/14 Study Conclusions  - Left ventricle: The cavity size was normal. Wall thickness was normal. Systolic function was normal. The estimated ejection fraction was in the range of 55% to 60%. Wall motion was normal; there were no regional wall motion abnormalities. Doppler parameters are consistent with high ventricular filling pressure. - Aortic valve: There was trivial regurgitation. - Left atrium: The atrium was mildly dilated.  Impressions:  - Normal LV function; elevated LV filling pressure; mild LAE; trace AI, MR and TR.   Assessment/Plan:  81 y/o female with h/o HTN, HLD, bradycardia and palpitations, presenting to the ED with tachy palpitations and found to be in atrial fibrillation w/ RVR. She was placed on IV Diltiazem and converted to NSR/SB overnight.   Principal Problem:   Atrial fibrillation with RVR-CHADs VASc=4 Active Problems:   Essential hypertension   Dyslipidemia   Hyperglycemia   Troponin level elevated   PLAN: She has SSS component with past documented  palpations and known bradycardia. She is considering NOAC Rx. She is on Lopressor 12.5 mg BID currently. ? DC today with plans for 48 hr monitor and early f/u.   [employer] PA-C 12/08/2014, 11:10 AM [phone]

## 2014-12-08 NOTE — Progress Notes (Signed)
Noftified Dr. Debara Pickett and Dr. Tana Coast of pt HR 49. Carroll Kinds RN

## 2014-12-08 NOTE — Progress Notes (Signed)
Pt is ready for discharge. Pt has all paperwork, and RN has gone over all discharge instructions. VSS. IV has been removed. Pt understands to go over to her cardiologist office after discharge to obtain a Holter monitor. Pt agrees and understands all discharge instructions. Pt instructed to call Unit or MD if she has any further questions about discharge. Etta Quill, RN

## 2014-12-08 NOTE — Discharge Instructions (Signed)
Atrial Fibrillation °Atrial fibrillation is a type of irregular heart rhythm (arrhythmia). During atrial fibrillation, the upper chambers of the heart (atria) quiver continuously in a chaotic pattern. This causes an irregular and often rapid heart rate.  °Atrial fibrillation is the result of the heart becoming overloaded with disorganized signals that tell it to beat. These signals are normally released one at a time by a part of the right atrium called the sinoatrial node. They then travel from the atria to the lower chambers of the heart (ventricles), causing the atria and ventricles to contract and pump blood as they pass. In atrial fibrillation, parts of the atria outside of the sinoatrial node also release these signals. This results in two problems. First, the atria receive so many signals that they do not have time to fully contract. Second, the ventricles, which can only receive one signal at a time, beat irregularly and out of rhythm with the atria.  °There are three types of atrial fibrillation:  °· Paroxysmal. Paroxysmal atrial fibrillation starts suddenly and stops on its own within a week. °· Persistent. Persistent atrial fibrillation lasts for more than a week. It may stop on its own or with treatment. °· Permanent. Permanent atrial fibrillation does not go away. Episodes of atrial fibrillation may lead to permanent atrial fibrillation. °Atrial fibrillation can prevent your heart from pumping blood normally. It increases your risk of stroke and can lead to heart failure.  °CAUSES  °· Heart conditions, including a heart attack, heart failure, coronary artery disease, and heart valve conditions.   °· Inflammation of the sac that surrounds the heart (pericarditis). °· Blockage of an artery in the lungs (pulmonary embolism). °· Pneumonia or other infections. °· Chronic lung disease. °· Thyroid problems, especially if the thyroid is overactive (hyperthyroidism). °· Caffeine, excessive alcohol use, and use  of some illegal drugs.   °· Use of some medicines, including certain decongestants and diet pills. °· Heart surgery.   °· Birth defects.   °Sometimes, no cause can be found. When this happens, the atrial fibrillation is called lone atrial fibrillation. The risk of complications from atrial fibrillation increases if you have lone atrial fibrillation and you are age 60 years or older. °RISK FACTORS °· Heart failure. °· Coronary artery disease. °· Diabetes mellitus.   °· High blood pressure (hypertension).   °· Obesity.   °· Other arrhythmias.   °· Increased age. °SIGNS AND SYMPTOMS  °· A feeling that your heart is beating rapidly or irregularly.   °· A feeling of discomfort or pain in your chest.   °· Shortness of breath.   °· Sudden light-headedness or weakness.   °· Getting tired easily when exercising.   °· Urinating more often than normal (mainly when atrial fibrillation first begins).   °In paroxysmal atrial fibrillation, symptoms may start and suddenly stop. °DIAGNOSIS  °Your health care provider may be able to detect atrial fibrillation when taking your pulse. Your health care provider may have you take a test called an ambulatory electrocardiogram (ECG). An ECG records your heartbeat patterns over a 24-hour period. You may also have other tests, such as: °· Transthoracic echocardiogram (TTE). During echocardiography, sound waves are used to evaluate how blood flows through your heart. °· Transesophageal echocardiogram (TEE). °· Stress test. There is more than one type of stress test. If a stress test is needed, ask your health care provider about which type is best for you. °· Chest X-ray exam. °· Blood tests. °· Computed tomography (CT). °TREATMENT  °Treatment may include: °· Treating any underlying conditions. For example, if you   have an overactive thyroid, treating the condition may correct atrial fibrillation.  Taking medicine. Medicines may be given to control a rapid heart rate or to prevent blood  clots, heart failure, or a stroke.  Having a procedure to correct the rhythm of the heart:  Electrical cardioversion. During electrical cardioversion, a controlled, low-energy shock is delivered to the heart through your skin. If you have chest pain, very low blood pressure, or sudden heart failure, this procedure may need to be done as an emergency.  Catheter ablation. During this procedure, heart tissues that send the signals that cause atrial fibrillation are destroyed.  Surgical ablation. During this surgery, thin lines of heart tissue that carry the abnormal signals are destroyed. This procedure can either be an open-heart surgery or a minimally invasive surgery. With the minimally invasive surgery, small cuts are made to access the heart instead of a large opening.  Pulmonary venous isolation. During this surgery, tissue around the veins that carry blood from the lungs (pulmonary veins) is destroyed. This tissue is thought to carry the abnormal signals. HOME CARE INSTRUCTIONS   Take medicines only as directed by your health care provider. Some medicines can make atrial fibrillation worse or recur.  If blood thinners were prescribed by your health care provider, take them exactly as directed. Too much blood-thinning medicine can cause bleeding. If you take too little, you will not have the needed protection against stroke and other problems.  Perform blood tests at home if directed by your health care provider. Perform blood tests exactly as directed.  Quit smoking if you smoke.  Do not drink alcohol.  Do not drink caffeinated beverages such as coffee, soda, and some teas. You may drink decaffeinated coffee, soda, or tea.   Maintain a healthy weight.Do not use diet pills unless your health care provider approves. They may make heart problems worse.   Follow diet instructions as directed by your health care provider.  Exercise regularly as directed by your health care  provider.  Keep all follow-up visits as directed by your health care provider. This is important. PREVENTION  The following substances can cause atrial fibrillation to recur:   Caffeinated beverages.  Alcohol.  Certain medicines, especially those used for breathing problems.  Certain herbs and herbal medicines, such as those containing ephedra or ginseng.  Illegal drugs, such as cocaine and amphetamines. Sometimes medicines are given to prevent atrial fibrillation from recurring. Proper treatment of any underlying condition is also important in helping prevent recurrence.  SEEK MEDICAL CARE IF:  You notice a change in the rate, rhythm, or strength of your heartbeat.  You suddenly begin urinating more frequently.  You tire more easily when exerting yourself or exercising. SEEK IMMEDIATE MEDICAL CARE IF:   You have chest pain, abdominal pain, sweating, or weakness.  You feel nauseous.  You have shortness of breath.  You suddenly have swollen feet and ankles.  You feel dizzy.  Your face or limbs feel numb or weak.  You have a change in your vision or speech. MAKE SURE YOU:   Understand these instructions.  Will watch your condition.  Will get help right away if you are not doing well or get worse. Document Released: 05/08/2005 Document Revised: 09/22/2013 Document Reviewed: 06/18/2012 [facility] Patient Information 2015 [place], [place]. This information is not intended to replace advice given to you by your health care provider. Make sure you discuss any questions you have with your health care provider. Holter Monitoring A Holter monitor is  a small device with electrodes (small sticky patches) that attach to your chest. It records the electrical activity of your heart and is worn continuously for 24-48 hours.  A HOLTER MONITOR IS USED TO  Detect heart problems such as:  Heart arrhythmia. Is an abnormal or irregular heartbeat. With some heart arrhythmias, you may not  feel or know that you have an irregular heart rhythm.  Palpitations, such as feeling your heart racing or fluttering. It is possible to have heart palpitations and not have a heart arrhythmia.  A heart rhythm that is too slow or too fast.  If you have problems fainting, near fainting or feeling light-headed, a Holter monitor may be worn to see if your heart is the cause. HOLTER MONITOR PREPARATION   Electrodes will be attached to the skin on your chest.  If you have hair on your chest, small areas may have to be shaved. This is done to help the patches stick better and make the recording more accurate.  The electrodes are attached by wires to the Holter monitor. The Holter monitor clips to your clothing. You will wear the monitor at all times, even while exercising and sleeping. HOME CARE INSTRUCTIONS   Wear your monitor at all times.  The wires and the monitor must stay dry. Do not get the monitor wet.  Do not bathe, swim or use a hot tub with it on.  You may do a "sponge" bath while you have the monitor on.  Keep your skin clean, do not put body lotion or moisturizer on your chest.  It's possible that your skin under the electrodes could become irritated. To keep this from happening, you may put the electrodes in slightly different places on your chest.  Your caregiver will also ask you to keep a diary of your activities, such as walking or doing chores. Be sure to note what you are doing if you experience heart symptoms such as palpitations. This will help your caregiver determine what might be contributing to your symptoms. The information stored in your monitor will be reviewed by your caregiver alongside your diary entries.  Make sure the monitor is safely clipped to your clothing or in a location close to your body that your caregiver recommends.  The monitor and electrodes are removed when the test is over. Return the monitor as directed.  Be sure to follow up with your  caregiver and discuss your Holter monitor results. SEEK IMMEDIATE MEDICAL CARE IF:  You faint or feel lightheaded.  You have trouble breathing.  You get pain in your chest, upper arm or jaw.  You feel sick to your stomach and your skin is pale, cool, or damp.  You think something is wrong with the way your heart is beating. MAKE SURE YOU:   Understand these instructions.  Will watch your condition.  Will get help right away if you are not doing well or get worse. Document Released: 02/04/2004 Document Revised: 07/31/2011 Document Reviewed: 06/18/2008 [facility] Patient Information 2015 [place], [place]. This information is not intended to replace advice given to you by your health care provider. Make sure you discuss any questions you have with your health care provider.  Go to [employer] office to get Holter after discharge

## 2014-12-08 NOTE — Discharge Summary (Addendum)
Physician Discharge Summary   Patient ID: [name] Small MRN: [id number] DOB/AGE: Apr 27, 1929 81 y.o.  Admit date: 12/07/2014 Discharge date: 12/08/2014  Primary Care Physician:  Nyoka Cowden, MD  Discharge Diagnoses:    . Hyperglycemia . Atrial fibrillation with RVR-CHADs VASc=4 . Essential hypertension . Dyslipidemia . Troponin level elevated  Consults:  Cardiology, Dr. Debara Pickett   Recommendations for Outpatient Follow-up:  Please note patient was started on Coreg 3.125 mg twice a day,eliquis  2.5 mg twice a day  TESTS THAT NEED FOLLOW-UP Please follow hemoglobin A1c, currently 6.1   DIET: Heart healthy diet    Allergies:   Allergies  Allergen Reactions  . Lisinopril     Cough     Discharge Medications:   Medication List    STOP taking these medications        aspirin 81 MG tablet      TAKE these medications        acetaminophen 500 MG tablet  Commonly known as:  TYLENOL  Take 500 mg by mouth daily.     apixaban 2.5 MG Tabs tablet  Commonly known as:  ELIQUIS  Take 1 tablet (2.5 mg total) by mouth 2 (two) times daily.     atorvastatin 40 MG tablet  Commonly known as:  LIPITOR  Take 1 tablet (40 mg total) by mouth daily.     CALTRATE 600+D PO  Take 1 tablet by mouth daily.     carvedilol 3.125 MG tablet  Commonly known as:  COREG  Take 1 tablet (3.125 mg total) by mouth 2 (two) times daily with a meal.  Start taking on:  12/09/2014     losartan 100 MG tablet  Commonly known as:  COZAAR  Take 1 tablet (100 mg total) by mouth daily.     REFRESH OP  Place 1 drop into both eyes 2 (two) times daily.         Brief H and P: For complete details please refer to admission H and P, but in brief [name] Small is a 81 y.o. female HTN, HLD, overactive bladder and osteoporosis presented to the ED due to an episode of feeling hot, sweaty and weak with a fast heart rate. The episode began at 0730 this morning and persisted until she receiving medicine  in the ED. Per daughter-in-law, patient was also pale during this episode. Patient says she felt normal when she went to sleep last night. Patient denies nausea, vomiting, dizziness, light-headedness, confusion, vision changes. No orthopnea, PND, DOE, SOB or chest pain. She also reported persistent urinary frequency and feeling dehydrated. She had been eating and drinking normally, with some decreased appetite and has been feeling more tired than usual. She experienced an episode similar to this is Feb 2016 and was taking metoprolol at that time. She saw a cardiologist, no interventions were made at that time. The metoprolol was discontinued in May 2016 due to bradycardia. Patient had a hip fracture in Feb 2016 and was placed on coumadin for DVT prophylaxis, but is currently on only aspirin -not anti-coagulants. In the ED, patient was given metoprolol that resolved her symptoms and IVF. EKG demonstrated atrial fibrillation with RVR. POC Trop negative. Labwork includes an elevated Cr at 1.11, other lab work unremarkable. CXR unremarkable. Patient is currently asymptomatic and is tachycardic in atrial fibrillation.    Hospital Course:  Atrial fibrillation with RVR Patient was admitted for further workup. Patient was given IV Lopressor with improvement in her heart rate. Her troponins  were elevated, patient was admitted for further workup. Cardiology was consulted. However after the Lopressor, patient did have bradycardia with heart rate in 40s, cardiology, Dr. Debara Pickett recommended to discontinue metoprolol and placed on Coreg. 2-D echocardiogram showed EF of 55-60%, normal wall motion. - CHADS-VASC 4-5, patient was started on Eliquis 2.5mg  BID. Aspirin was discontinued TSH 1.019, magnesium 2.1. Cardiology recommended 48 hours monitor to rule out sick sinus syndrome. Cardiology with Will arrange the monitor.  Hypertension Currently stable, continue losartan, added Coreg  Hyperlipidemia Continue  statin  Hyperglycemia, diabetes Hemoglobin A1c 6.1, patient was not started on any oral hypoglycemic, a.m. blood sugar was 104. Please follow closely.  PT evaluation showed no follow-up needed    Day of Discharge BP 162/58 mmHg  Pulse 51  Temp(Src) 97.9 F (36.6 C) (Oral)  Resp 16  Ht 5\' 5"  (1.651 m)  Wt 59.24 kg (130 lb 9.6 oz)  BMI 21.73 kg/m2  SpO2 99%  Physical Exam: General: Alert and awake oriented x3 not in any acute distress. HEENT: anicteric sclera, pupils reactive to light and accommodation CVS: S1-S2 clear no murmur rubs or gallops Chest: clear to auscultation bilaterally, no wheezing rales or rhonchi Abdomen: soft nontender, nondistended, normal bowel sounds Extremities: no cyanosis, clubbing or edema noted bilaterally Neuro: Cranial nerves II-XII intact, no focal neurological deficits   The results of significant diagnostics from this hospitalization (including imaging, microbiology, ancillary and laboratory) are listed below for reference.    LAB RESULTS: Basic Metabolic Panel:  Recent Labs Lab 12/07/14 0838 12/07/14 1531 12/08/14 0249  NA 140  --  139  K 4.0  --  3.9  CL 106  --  110  CO2 24  --  25  GLUCOSE 142*  --  104*  BUN 6  --  14  CREATININE 1.11*  --  1.06*  CALCIUM 9.7  --  8.7*  MG  --  2.1  --    Liver Function Tests: No results for input(s): AST, ALT, ALKPHOS, BILITOT, PROT, ALBUMIN in the last 168 hours. No results for input(s): LIPASE, AMYLASE in the last 168 hours. No results for input(s): AMMONIA in the last 168 hours. CBC:  Recent Labs Lab 12/07/14 0838  WBC 8.3  HGB 14.1  HCT 44.3  MCV 87.7  PLT 287   Cardiac Enzymes:  Recent Labs Lab 12/07/14 2038 12/08/14 0249  TROPONINI 0.05* 0.05*   BNP: Invalid input(s): POCBNP CBG:  Recent Labs Lab 12/08/14 0742 12/08/14 1110  GLUCAP 101* 88    Significant Diagnostic Studies:  Dg Chest Port 1 View  12/07/2014   CLINICAL DATA:  Diaphoresis upon waking  this morning. Sensation of tachycardia.  EXAM: PORTABLE CHEST - 1 VIEW  COMPARISON:  07/08/2014  FINDINGS: Artifact overlies the chest. Heart size is normal. There is atherosclerosis of the aorta. The lungs are clear. The pulmonary vascularity is normal. No effusions. No acute bone finding.  IMPRESSION: No active disease   Electronically Signed   By: Nelson Chimes M.D.   On: 12/07/2014 08:51    2D ECHO: Study Conclusions  - Left ventricle: The cavity size was normal. Wall thickness was normal. Systolic function was normal. The estimated ejection fraction was in the range of 55% to 60%. Wall motion was normal; there were no regional wall motion abnormalities. Doppler parameters are consistent with high ventricular filling pressure. - Aortic valve: There was trivial regurgitation. - Left atrium: The atrium was mildly dilated.  Impressions:  - Normal LV function; elevated  LV filling pressure; mild LAE; trace AI, MR and TR.  Disposition and Follow-up: Discharge Instructions    Diet - low sodium heart healthy    Complete by:  As directed      Increase activity slowly    Complete by:  As directed             DISPOSITION: Home.    DISCHARGE FOLLOW-UP Follow-up Information    Follow up with Jenkins Rouge, MD. Go on 12/21/2014.   Specialty:  Cardiology   Why:  for hospital follow-up @ 2:30   Contact information:   [address] [address] [phone]       Follow up with Nyoka Cowden, MD. Go on 12/18/2014.   Specialty:  Internal Medicine   Why:  for hospital follow up @1030am    Contact information:   [place] [address] [phone]        Time spent on Discharge: 35 mins   Signed:   Domonique Cothran M.D. [facility] 12/08/2014, 1:50 PM Pager: [phone]

## 2014-12-08 NOTE — Care Management Note (Signed)
Case Management Note  Patient Details  Name: [name] Small MRN: [id number] Date of Birth: Dec 20, 1928  Subjective/Objective:       Pt admitted for Afib. Plan for d/c on Eliquis.              Action/Plan: Benefits check in process for Eliquis. Will make pt aware once completed. CM will provide pt with 30 day free card. Pt will need Rx to go along with 30 day free. [facility] has medication available.  No further needs from CM at this time.    Expected Discharge Date:                  Expected Discharge Plan:  Home/Self Care  In-House Referral:  NA  Discharge planning Services  CM Consult, Medication Assistance  Post Acute Care Choice:  NA Choice offered to:  NA  DME Arranged:  N/A DME Agency:  NA  HH Arranged:  NA HH Agency:  NA  Status of Service:  Completed, signed off  Medicare Important Message Given:    Date Medicare IM Given:    Medicare IM give by:    Date Additional Medicare IM Given:    Additional Medicare Important Message give by:     If discussed at [place] of Stay Meetings, dates discussed:    Additional Comments:  Bethena Roys, RN 12/08/2014, 12:40 PM

## 2014-12-08 NOTE — Evaluation (Signed)
Occupational Therapy Evaluation Patient Details Name: [name] Small MRN: [id number] DOB: 1928/10/25 Today's Date: 12/08/2014    History of Present Illness 81 y.o. female admitted from [facility] on 12/07/14 with A fib with RVR.  Pt with significant PMHx of diverticulosis, osteoporosis, CA (melanoma of L leg) HTN, L femur IM nail s/p fall in Feb of 2016.   Clinical Impression   Pt admitted with above. Pt moving well in session and education provided. Feel pt is safe to d/c home, from OT standpoint. HR remained in 50-60s.   Follow Up Recommendations  No OT follow up    Equipment Recommendations  None recommended by OT    Recommendations for Other Services       Precautions / Restrictions Precautions Precautions: Fall Precaution Comments: h/o fall Restrictions Weight Bearing Restrictions: No      Mobility Bed Mobility     pt sitting EOB              Transfers Overall transfer level: Needs assistance   Transfers: Sit to/from Stand Sit to Stand: Supervision         General transfer comment: for IV; otherwise Mod I    Balance  No LOB in session.                                            ADL Overall ADL's : Needs assistance/impaired                     Lower Body Dressing: Supervision/safety;Sit to/from stand;Set up   Toilet Transfer: Supervision/safety;Ambulation (cane; sit to stand from bed)       Tub/ Shower Transfer: Walk-in shower;Supervision/safety;Ambulation   Functional mobility during ADLs: Supervision/safety;Cane General ADL Comments: Educated on energy conservation techniques as she reports she gets tired with bathing. Educated on safety such as sitting for LB ADLs, rugs/items on floor, and safe footwear.      Vision     Perception     Praxis      Pertinent Vitals/Pain Pain Assessment: Faces Faces Pain Scale: Hurts little more Pain Location: at IV site in left arm when she bends elbow Pain Intervention(s):  Monitored during session   HR in 50-60s in session.     Hand Dominance     Extremity/Trunk Assessment Upper Extremity Assessment Upper Extremity Assessment: Overall WFL for tasks assessed;Generalized weakness   Lower Extremity Assessment Lower Extremity Assessment: Defer to PT evaluation       Communication Communication Communication: No difficulties   Cognition Arousal/Alertness: Awake/alert Behavior During Therapy: WFL for tasks assessed/performed Overall Cognitive Status: Within Functional Limits for tasks assessed                     General Comments       Exercises       Shoulder Instructions      Home Living Family/patient expects to be discharged to:: Private residence Living Arrangements: Alone Available Help at Discharge: Family;Available PRN/intermittently Type of Home: House Home Access: Stairs to enter [employer] of Steps: 4 Entrance Stairs-Rails: Left Home Layout: One level     Bathroom Shower/Tub: Tub/shower unit and walk-in Psychologist, prison and probation services: Standard     Home Equipment: Cane - single point;Walker - 2 wheels;Grab bars - tub/shower;Shower seat - built in          Prior Functioning/Environment Level  of Independence: Independent with assistive device(s)        Comments: pt uses cane during the day at home and RW at night when she gets up to go to the bathroom.  Family lives next door and check on her regularly.  She still drives.     OT Diagnosis: Generalized weakness   OT Problem List:     OT Treatment/Interventions:      OT Goals(Current goals can be found in the care plan section)    OT Frequency:     Barriers to D/C:            Co-evaluation              End of Session Equipment Utilized During Treatment: Gait belt Nurse Communication: Other (comment) (HR)  Activity Tolerance: Patient tolerated treatment well Patient left: in bed;with call bell/phone within reach;with family/visitor  present   Time: 0511-0211 OT Time Calculation (min): 19 min Charges:  OT General Charges $OT Visit: 1 Procedure OT Evaluation $Initial OT Evaluation Tier I: 1 Procedure G-CodesBenito Mccreedy OTR/L [id number] 12/08/2014, 11:05 AM

## 2014-12-09 ENCOUNTER — Telehealth: Payer: Self-pay | Admitting: *Deleted

## 2014-12-09 LAB — URINE CULTURE: Special Requests: NORMAL

## 2014-12-09 NOTE — Telephone Encounter (Signed)
[name] Small pt returned your call °

## 2014-12-09 NOTE — Telephone Encounter (Signed)
Left message on machine for patient to return our call 

## 2014-12-11 NOTE — Telephone Encounter (Signed)
Left message on machine for patient to return our call 

## 2014-12-14 NOTE — Telephone Encounter (Signed)
Transition Care Management Follow-up Telephone Call  How have you been since you were released from the hospital? Patient complaining of dizziness and rash   Do you understand why you were in the hospital? yes   Do you understand the discharge instrcutions? yes  Items Reviewed:  Medications reviewed: yes  Allergies reviewed: yes  Dietary changes reviewed: yes  Referrals reviewed: yes   Functional Questionnaire:   Activities of Daily Living (ADLs):   She states they are independent in the following: ambulation, bathing and hygiene, feeding, continence, grooming, toileting and dressing - hard to take a shower States they require assistance with the following: hard to take a shower   Any transportation issues/concerns?: no   Any patient concerns? Yes - patient may be having an allergic reaction to Eliquis   Confirmed importance and date/time of follow-up visits scheduled: yes   Confirmed with patient if condition begins to worsen call PCP or go to the ER.  Patient was given the Call-a-Nurse line [phone]: yes Left message on machine for patient to return our call Left message on machine for patient to return our call Patient was discharged 12/08/14 Patient was discharged to her home Patient has an appointment with Dr Burnice Logan 12/18/14 at 10:30 am Appointment rescheduled per Dr Raliegh Ip to 12/17/14 at 2:30 pm and patient is aware

## 2014-12-16 ENCOUNTER — Telehealth: Payer: Self-pay | Admitting: *Deleted

## 2014-12-16 MED ORDER — RIVAROXABAN 15 MG PO TABS: 15.0000 mg | ORAL_TABLET | Freq: Every day | ORAL | Status: DC

## 2014-12-16 NOTE — Telephone Encounter (Signed)
We didn't prescribe eliquis and my last note indicated I didn't think she should be on anticoagulation

## 2014-12-16 NOTE — Telephone Encounter (Signed)
DR  [place]  NOTE  AND  RECOMMENDS STOPPING ELIQUIS  AND  STARTING  XARELTO  15 MG  EVERY DAY

## 2014-12-16 NOTE — Telephone Encounter (Signed)
Patient believes that she may be having an allergic reaction to Eliquis.  She has a rash, feels her lips may be swelling, and she is lightheaded.  Patient has an appointment 12/18/14. Please advise.

## 2014-12-16 NOTE — Telephone Encounter (Signed)
Patient is aware that she should call cardiology per Dr Burnice Logan.  Also her appointment has been moved.

## 2014-12-16 NOTE — Telephone Encounter (Signed)
PT AWARE OF MED CHANGE./CY 

## 2014-12-16 NOTE — Telephone Encounter (Signed)
WHILE GIVING PT  MONITOR RESULTS PT  HAS  COMPLAINED ABOUT  MOUTH SORES  AND  BLEEDING ,  THINKS  ELIQUIS   IS CULPRIT  HAS NOT  TAKEN  TODAY .  WILL FORWARD TO  DR Johnsie Cancel  FOR REVIEW ./C

## 2014-12-17 ENCOUNTER — Encounter: Payer: Self-pay | Admitting: Internal Medicine

## 2014-12-17 ENCOUNTER — Ambulatory Visit (INDEPENDENT_AMBULATORY_CARE_PROVIDER_SITE_OTHER): Payer: Medicare Other | Admitting: Internal Medicine

## 2014-12-17 ENCOUNTER — Telehealth: Payer: Self-pay

## 2014-12-17 VITALS — BP 120/60 | HR 55 | Temp 97.8°F | Resp 18 | Ht 65.0 in | Wt 129.0 lb

## 2014-12-17 DIAGNOSIS — R739 Hyperglycemia, unspecified: Secondary | ICD-10-CM | POA: Diagnosis not present

## 2014-12-17 DIAGNOSIS — I4891 Unspecified atrial fibrillation: Secondary | ICD-10-CM | POA: Diagnosis not present

## 2014-12-17 DIAGNOSIS — I10 Essential (primary) hypertension: Secondary | ICD-10-CM

## 2014-12-17 LAB — POCT GLUCOSE (DEVICE FOR HOME USE): POC GLUCOSE: 132 mg/dL — AB (ref 70–99)

## 2014-12-17 NOTE — Progress Notes (Signed)
Pre visit review using our clinic review tool, if applicable. No additional management support is needed unless otherwise documented below in the visit note. 

## 2014-12-17 NOTE — Progress Notes (Signed)
Subjective:    Patient ID: [name] Small, female    DOB: 20-Oct-1928, 81 y.o.   MRN: [id number]  HPI Admit date: 12/07/2014 Discharge date: 12/08/2014  Discharge Diagnoses:   . Hyperglycemia . Atrial fibrillation with RVR-CHADs VASc=4 . Essential hypertension . Dyslipidemia . Troponin level elevated  Consults: Cardiology, Dr. Debara Pickett   Recommendations for Outpatient Follow-up:  Please note patient was started on Coreg 3.125 mg twice a day,eliquis 2.5 mg twice a day  TESTS THAT NEED FOLLOW-UP Please follow hemoglobin A1c, currently 28.30  81 year old patient seen today for a follow up following a recent hospital discharge.  She was admitted with atrial fib with uncontrolled ventricular response and now is on anticoagulation.  She was discharged on Eliquis , but this has been switched to Xarelto which she seems to be tolerating well. Has done well since her hospital discharge without episodes of weakness, dizziness or palpitations.  No shortness of breath.  Hospital evaluation included a 2-D echo with a normal ejection fraction.  She has had some bradycardia in the past with metoprolol and was discharged on Coreg.  She is scheduled for cardiology follow-up soon.  Past Medical History  Diagnosis Date  . DIVERTICULOSIS, COLON 11/22/2006  . HYPERLIPIDEMIA 11/22/2006  . OSTEOPOROSIS 11/22/2006  . Menopausal syndrome (hot flashes)   . Headache(784.0)   . Cancer Meloanoma left leg  . Hypertension     History   Social History  . Marital Status: Widowed    Spouse Name: N/A  . Number of Children: N/A  . Years of Education: N/A   Occupational History  . Not on file.   Social History Main Topics  . Smoking status: Never Smoker   . Smokeless tobacco: Never Used  . Alcohol Use: No  . Drug Use: No  . Sexual Activity: No   Other Topics Concern  . Not on file   Social History Narrative    Past Surgical History  Procedure Laterality Date  . Appendectomy    . Cholecystectomy    .  Bunionectomy    . Femur im nail Left 07/08/2014    Procedure: INTRAMEDULLARY (IM) NAIL ;  Surgeon: Meredith Pel, MD;  Location: [facility] ORS;  Service: Orthopedics;  Laterality: Left;    Family History  Problem Relation Age of Onset  . Alzheimer's disease Sister   . Cardiomyopathy Brother     Allergies  Allergen Reactions  . Lisinopril     Cough    Current Outpatient Prescriptions on File Prior to Visit  Medication Sig Dispense Refill  . acetaminophen (TYLENOL) 500 MG tablet Take 500 mg by mouth daily.    [name] atorvastatin (LIPITOR) 40 MG tablet Take 1 tablet (40 mg total) by mouth daily. 90 tablet 3  . Calcium Carbonate-Vitamin D (CALTRATE 600+D PO) Take 1 tablet by mouth daily.      . carvedilol (COREG) 3.125 MG tablet Take 1 tablet (3.125 mg total) by mouth 2 (two) times daily with a meal. 60 tablet 2  . losartan (COZAAR) 100 MG tablet Take 1 tablet (100 mg total) by mouth daily. 90 tablet 3  . Polyvinyl Alcohol-Povidone (REFRESH OP) Place 1 drop into both eyes 2 (two) times daily.    . Rivaroxaban (XARELTO) 15 MG TABS tablet Take 1 tablet (15 mg total) by mouth daily with supper. (Patient not taking: Reported on 12/17/2014) 30 tablet 11   No current facility-administered medications on file prior to visit.    BP 120/60 mmHg  Pulse 55  Temp(Src) 97.8 F (36.6 C) (Oral)  Resp 18  Ht 5\' 5"  (1.651 m)  Wt 129 lb (58.514 kg)  BMI 21.47 kg/m2  SpO2 98%      Review of Systems  Constitutional: Positive for fatigue.  HENT: Negative for congestion, dental problem, hearing loss, rhinorrhea, sinus pressure, sore throat and tinnitus.   Eyes: Negative for pain, discharge and visual disturbance.  Respiratory: Negative for cough and shortness of breath.   Cardiovascular: Positive for leg swelling. Negative for chest pain and palpitations.  Gastrointestinal: Negative for nausea, vomiting, abdominal pain, diarrhea, constipation, blood in stool and abdominal distention.  Genitourinary:  Negative for dysuria, urgency, frequency, hematuria, flank pain, vaginal bleeding, vaginal discharge, difficulty urinating, vaginal pain and pelvic pain.  Musculoskeletal: Positive for gait problem. Negative for joint swelling and arthralgias.  Skin: Negative for rash.  Neurological: Negative for dizziness, syncope, speech difficulty, weakness, numbness and headaches.  Hematological: Negative for adenopathy.  Psychiatric/Behavioral: Negative for behavioral problems, dysphoric mood and agitation. The patient is not nervous/anxious.        Objective:   Physical Exam  Constitutional: She is oriented to person, place, and time. She appears well-developed and well-nourished.  Blood pressure 120/64 Pulse regular  HENT:  Head: Normocephalic.  Right Ear: External ear normal.  Left Ear: External ear normal.  Mouth/Throat: Oropharynx is clear and moist.  Eyes: Conjunctivae and EOM are normal. Pupils are equal, round, and reactive to light.  Neck: Normal range of motion. Neck supple. No thyromegaly present.  Cardiovascular: Normal rate, regular rhythm, normal heart sounds and intact distal pulses.   Pulmonary/Chest: Effort normal and breath sounds normal. No respiratory distress. She has no wheezes. She has no rales.  Abdominal: Soft. Bowel sounds are normal. She exhibits no mass. There is no tenderness.  Musculoskeletal: Normal range of motion. She exhibits edema.  Mild chronic left ankle edema  Walks with a cane  Lymphadenopathy:    She has no cervical adenopathy.  Neurological: She is alert and oriented to person, place, and time.  Skin: Skin is warm and dry. No rash noted.  Psychiatric: She has a normal mood and affect. Her behavior is normal.          Assessment & Plan:   Atrial fibrillation with rapid ventricular response.  Presently normal sinus rhythm.  Will continue anticoagulation Essential hypertension, stable Status post left hip fracture History of stress hyperglycemia.   Will monitor blood sugars carefully.  Random blood sugar today 1:30  Follow-up as scheduled Cardiology follow-up as scheduled

## 2014-12-17 NOTE — Patient Instructions (Signed)
Limit your sodium (Salt) intake  Cardiology follow-up as scheduled  Report any new or worsening symptoms, such as shortness of breath, palpitations, or weakness

## 2014-12-17 NOTE — Telephone Encounter (Signed)
Prior auth sent to [facility] for Xarelto 15mg  via Cover My Meds.

## 2014-12-18 ENCOUNTER — Telehealth: Payer: Self-pay

## 2014-12-18 ENCOUNTER — Ambulatory Visit: Payer: Self-pay | Admitting: Internal Medicine

## 2014-12-18 NOTE — Telephone Encounter (Signed)
Xarelto 15mg  approved through [facility]. Local pharmacy notified.

## 2014-12-21 ENCOUNTER — Telehealth: Payer: Self-pay

## 2014-12-21 ENCOUNTER — Ambulatory Visit (INDEPENDENT_AMBULATORY_CARE_PROVIDER_SITE_OTHER): Payer: Medicare Other | Admitting: Nurse Practitioner

## 2014-12-21 ENCOUNTER — Encounter: Payer: Self-pay | Admitting: Nurse Practitioner

## 2014-12-21 VITALS — BP 130/70 | HR 59 | Resp 18 | Ht 65.0 in | Wt 128.0 lb

## 2014-12-21 DIAGNOSIS — I480 Paroxysmal atrial fibrillation: Secondary | ICD-10-CM | POA: Diagnosis not present

## 2014-12-21 DIAGNOSIS — Z7901 Long term (current) use of anticoagulants: Secondary | ICD-10-CM

## 2014-12-21 DIAGNOSIS — I10 Essential (primary) hypertension: Secondary | ICD-10-CM

## 2014-12-21 LAB — BASIC METABOLIC PANEL
BUN: 12 mg/dL (ref 6–23)
CO2: 27 mEq/L (ref 19–32)
Calcium: 9.8 mg/dL (ref 8.4–10.5)
Chloride: 104 mEq/L (ref 96–112)
Creatinine, Ser: 0.96 mg/dL (ref 0.40–1.20)
GFR: 58.86 mL/min — ABNORMAL LOW (ref >60.00)
Glucose, Bld: 87 mg/dL (ref 70–99)
Potassium: 3.8 mEq/L (ref 3.5–5.1)
Sodium: 140 mEq/L (ref 135–145)

## 2014-12-21 LAB — CBC
HCT: 36.8 % (ref 36.0–46.0)
Hemoglobin: 12.2 g/dL (ref 12.0–15.0)
MCHC: 33 g/dL (ref 30.0–36.0)
MCV: 86.1 fl (ref 78.0–100.0)
Platelets: 229 10*3/uL (ref 150.0–400.0)
RBC: 4.28 Mil/uL (ref 3.87–5.11)
RDW: 15.5 % (ref 11.5–15.5)
WBC: 7 10*3/uL (ref 4.0–10.5)

## 2014-12-21 NOTE — Patient Instructions (Addendum)
We will be checking the following labs today - BMET and CBC   Medication Instructions:    Continue with your current medicines.     Testing/Procedures To Be Arranged:  N/A  Follow-Up:   See Dr. Johnsie Cancel in 3 to 4 months.     Other Special Instructions:   N/A  Call the [place] office at [phone] if you have any questions, problems or concerns.

## 2014-12-21 NOTE — Progress Notes (Signed)
CARDIOLOGY OFFICE NOTE  Date:  12/21/2014    Dorothey Baseman [place] Date of Birth: 1929-02-12 Medical Record #[id number]  PCP:  Nyoka Cowden, MD  Cardiologist:  Johnsie Cancel    Chief Complaint  Patient presents with  . Atrial Fibrillation    Post hospital visit - seen for Dr. Johnsie Cancel    History of Present Illness: [name] Small is a 81 y.o. female who presents today for a follow up visit. Seen for Dr. Johnsie Cancel. She has a history of PAF, HTN, HLD, and DM. She had a hip fracture and was on coumadin back in February following left intratrochanteric fracture. She had missed steps in her garage.   Admitted in July with AF. She was given IV Lopressor with improvement in her heart rate. Her troponins were elevated. Cardiology was consulted. However after the Lopressor, patient did have bradycardia with heart rate in 40s, cardiology, Dr. Debara Pickett recommended to discontinue metoprolol and placed on Coreg. Her 2-D echocardiogram showed EF of 55-60%, normal wall motion. - CHADS-VASC 4-5, patient was started on Eliquis 2.5mg  BID. Aspirin was discontinued. Cardiology recommended 48 hours monitor to rule out sick sinus syndrome.   Phone call last week to report some sores in her mouth along with bleeding - was switched to Xarelto 15 mg per Dr. Johnsie Cancel.   Comes back today. Here with her daughter. She is doing well. No palpitations. No chest pain. Using her cane. Her fall back in February was due to missing a step. Still with some soreness in her hip but otherwise doing ok. She had some mouth bleeding and was switched to Xarelto. CrCl is 35. She has no real complaint. She notes that she remains fatigued but this is a chronic issue and is not new or worse since her recent admission. Feels like she is doing well.   Past Medical History  Diagnosis Date  . DIVERTICULOSIS, COLON 11/22/2006  . HYPERLIPIDEMIA 11/22/2006  . OSTEOPOROSIS 11/22/2006  . Menopausal syndrome (hot flashes)   . Headache(784.0)   . Cancer Meloanoma  left leg  . Hypertension     Past Surgical History  Procedure Laterality Date  . Appendectomy    . Cholecystectomy    . Bunionectomy    . Femur im nail Left 07/08/2014    Procedure: INTRAMEDULLARY (IM) NAIL ;  Surgeon: Meredith Pel, MD;  Location: [facility];  Service: Orthopedics;  Laterality: Left;     Medications: Current Outpatient Prescriptions  Medication Sig Dispense Refill  . acetaminophen (TYLENOL) 500 MG tablet Take 500 mg by mouth every 6 (six) hours as needed for mild pain or headache.     [name] atorvastatin (LIPITOR) 40 MG tablet Take 1 tablet (40 mg total) by mouth daily. 90 tablet 3  . Calcium Carbonate-Vitamin D (CALTRATE 600+D PO) Take 1 tablet by mouth daily.      . carvedilol (COREG) 3.125 MG tablet Take 1 tablet (3.125 mg total) by mouth 2 (two) times daily with a meal. 60 tablet 2  . losartan (COZAAR) 100 MG tablet Take 1 tablet (100 mg total) by mouth daily. 90 tablet 3  . Polyvinyl Alcohol-Povidone (REFRESH OP) Place 1 drop into both eyes 2 (two) times daily.    . Rivaroxaban (XARELTO) 15 MG TABS tablet Take 1 tablet (15 mg total) by mouth daily with supper. 30 tablet 11   No current facility-administered medications for this visit.    Allergies: Allergies  Allergen Reactions  . Lisinopril     Cough  Social History: The patient  reports that she has never smoked. She has never used smokeless tobacco. She reports that she does not drink alcohol or use illicit drugs.   Family History: The patient's family history includes Alzheimer's disease in her sister; Cardiomyopathy in her brother.   Review of Systems: Please see the history of present illness.   Otherwise, the review of systems is positive for none.   All other systems are reviewed and negative.   Physical Exam: VS:  BP 130/70 mmHg  Pulse 59  Resp 18  Ht 5\' 5"  (1.651 m)  Wt 128 lb (58.06 kg)  BMI 21.30 kg/m2  SpO2 98% .  BMI Body mass index is 21.3 kg/(m^2).  Wt Readings from Last 3  Encounters:  12/21/14 128 lb (58.06 kg)  12/17/14 129 lb (58.514 kg)  12/08/14 130 lb 9.6 oz (59.24 kg)    General: Pleasant. Well developed, well nourished and in no acute distress.  HEENT: Normal. Neck: Supple, no JVD, carotid bruits, or masses noted.  Cardiac: Regular rate and rhythm. No murmurs, rubs, or gallops. No edema.  Respiratory:  Lungs are clear to auscultation bilaterally with normal work of breathing.  GI: Soft and nontender.  MS: No deformity or atrophy. Gait and ROM intact. Skin: Warm and dry. Color is normal.  Neuro:  Strength and sensation are intact and no gross focal deficits noted.  Psych: Alert, appropriate and with normal affect.   LABORATORY DATA:  EKG:  EKG is ordered today. This demonstrates sinus brady with a rate of 54. Has RSR'.   Lab Results  Component Value Date   WBC 8.3 12/07/2014   HGB 14.1 12/07/2014   HCT 44.3 12/07/2014   PLT 287 12/07/2014   GLUCOSE 104* 12/08/2014   CHOL 277* 04/07/2014   TRIG 114.0 04/07/2014   HDL 62.00 04/07/2014   LDLDIRECT 176.0 02/24/2008   LDLCALC 192* 04/07/2014   ALT 18 04/07/2014   AST 29 04/07/2014   NA 139 12/08/2014   K 3.9 12/08/2014   CL 110 12/08/2014   CREATININE 1.06* 12/08/2014   BUN 14 12/08/2014   CO2 25 12/08/2014   TSH 1.019 12/07/2014   INR 2.4 08/10/2014   HGBA1C 6.1* 12/07/2014    BNP (last 3 results) No results for input(s): BNP in the last 8760 hours.  ProBNP (last 3 results) No results for input(s): PROBNP in the last 8760 hours.   Other Studies Reviewed Today: Notes Recorded by Josue Hector, MD on 12/15/2014 at 2:34 PM Monitor ok no significant arrhythmias just PAC;s and PVC;s     Echo Study Conclusions from July 2017  - Left ventricle: The cavity size was normal. Wall thickness was normal. Systolic function was normal. The estimated ejection fraction was in the range of 55% to 60%. Wall motion was normal; there were no regional wall motion abnormalities.  Doppler parameters are consistent with high ventricular filling pressure. - Aortic valve: There was trivial regurgitation. - Left atrium: The atrium was mildly dilated.  Impressions:  - Normal LV function; elevated LV filling pressure; mild LAE; trace AI, MR and TR.   Assessment/Plan: 1. PAF -  Remains in sinus.   2. Chronic anticoagulation with Xarelto - she did not tolerate Eliquis. Recheck surveillance labs today.  3. HTN - BP is good on her current regimen.   4. HLD - on statin  5. Bradycardia - recent monitor ok - no profound bradycardia noted. Rate ok by EKG today.   Current medicines  are reviewed with the patient today.  The patient does not have concerns regarding medicines other than what has been noted above.  The following changes have been made:  See above.  Labs/ tests ordered today include:    Orders Placed This Encounter  Procedures  . Basic metabolic panel  . CBC  . EKG 12-Lead     Disposition:   FU with Dr. Johnsie Cancel in 4 months.   Patient is agreeable to this plan and will call if any problems develop in the interim.   Signed: Burtis Junes, RN, ANP-C 12/21/2014 2:41 PM  [facility] [address] [place], [place]  [address] Phone: [phone] Fax: [phone]

## 2014-12-21 NOTE — Telephone Encounter (Signed)
Opened in error

## 2015-01-07 ENCOUNTER — Telehealth: Payer: Self-pay | Admitting: Cardiovascular Disease

## 2015-01-07 NOTE — Telephone Encounter (Signed)
SPOKE WITH  PT  IS  NOT  TOLERATING  XARELTO  HAD NOT  TOLERATED   ELIQUIS EITHER  INFORMED WILL DISCUSS WITH DR Johnsie Cancel PER  PT   WILL TRY AND TAKE  BENADRYL  AND SEE IF HELPS  WITH ITCHINESS   DOES  NOT  WANT TO TAKE   COUMADIN   WILL CONTINUE  XARELTO PT TO  CALL NEXT WEEK IF NO IMPROVEMENT .Adonis Housekeeper

## 2015-01-07 NOTE — Telephone Encounter (Signed)
Pt c/o medication issue:  1. Name of Medication: Xareloto  2. How are you currently taking this medication (dosage and times per day)? 15 mg 1x per day   3. Are you having a reaction (difficulty breathing--STAT)? Making pt dizzy and making her itch  4. What is your medication issue?

## 2015-01-27 ENCOUNTER — Emergency Department (HOSPITAL_COMMUNITY)
Admission: EM | Admit: 2015-01-27 | Discharge: 2015-01-27 | Disposition: A | Discharge status: Home / Self Care | Payer: Medicare Other | Attending: Emergency Medicine | Admitting: Emergency Medicine

## 2015-01-27 ENCOUNTER — Emergency Department (HOSPITAL_COMMUNITY): Payer: Medicare Other

## 2015-01-27 ENCOUNTER — Encounter (HOSPITAL_COMMUNITY): Payer: Self-pay | Admitting: Family Medicine

## 2015-01-27 DIAGNOSIS — Z8742 Personal history of other diseases of the female genital tract: Secondary | ICD-10-CM | POA: Insufficient documentation

## 2015-01-27 DIAGNOSIS — Z79899 Other long term (current) drug therapy: Secondary | ICD-10-CM | POA: Diagnosis not present

## 2015-01-27 DIAGNOSIS — R1110 Vomiting, unspecified: Secondary | ICD-10-CM | POA: Diagnosis not present

## 2015-01-27 DIAGNOSIS — I4891 Unspecified atrial fibrillation: Secondary | ICD-10-CM | POA: Insufficient documentation

## 2015-01-27 DIAGNOSIS — Z7901 Long term (current) use of anticoagulants: Secondary | ICD-10-CM | POA: Diagnosis not present

## 2015-01-27 DIAGNOSIS — E785 Hyperlipidemia, unspecified: Secondary | ICD-10-CM | POA: Insufficient documentation

## 2015-01-27 DIAGNOSIS — I10 Essential (primary) hypertension: Secondary | ICD-10-CM | POA: Diagnosis not present

## 2015-01-27 DIAGNOSIS — R61 Generalized hyperhidrosis: Secondary | ICD-10-CM | POA: Insufficient documentation

## 2015-01-27 DIAGNOSIS — R5381 Other malaise: Secondary | ICD-10-CM | POA: Diagnosis not present

## 2015-01-27 DIAGNOSIS — Z8739 Personal history of other diseases of the musculoskeletal system and connective tissue: Secondary | ICD-10-CM | POA: Diagnosis not present

## 2015-01-27 DIAGNOSIS — Z859 Personal history of malignant neoplasm, unspecified: Secondary | ICD-10-CM | POA: Insufficient documentation

## 2015-01-27 DIAGNOSIS — R531 Weakness: Secondary | ICD-10-CM | POA: Diagnosis present

## 2015-01-27 DIAGNOSIS — Z8719 Personal history of other diseases of the digestive system: Secondary | ICD-10-CM | POA: Diagnosis not present

## 2015-01-27 LAB — BASIC METABOLIC PANEL
Anion gap: 11 (ref 5–15)
BUN: 12 mg/dL (ref 6–20)
CALCIUM: 9.9 mg/dL (ref 8.9–10.3)
CO2: 25 mmol/L (ref 22–32)
CREATININE: 1.22 mg/dL — AB (ref 0.44–1.00)
Chloride: 106 mmol/L (ref 101–111)
GFR, EST AFRICAN AMERICAN: 46 mL/min — AB (ref >60)
GFR, EST NON AFRICAN AMERICAN: 40 mL/min — AB (ref >60)
Glucose, Bld: 182 mg/dL — ABNORMAL HIGH (ref 65–99)
Potassium: 3.7 mmol/L (ref 3.5–5.1)
SODIUM: 142 mmol/L (ref 135–145)

## 2015-01-27 LAB — URINALYSIS, ROUTINE W REFLEX MICROSCOPIC
BILIRUBIN URINE: NEGATIVE
GLUCOSE, UA: NEGATIVE mg/dL
Hgb urine dipstick: NEGATIVE
KETONES UR: NEGATIVE mg/dL
Leukocytes, UA: NEGATIVE
NITRITE: NEGATIVE
PH: 6 (ref 5.0–8.0)
Protein, ur: NEGATIVE mg/dL
Specific Gravity, Urine: 1.009 (ref 1.005–1.030)
Urobilinogen, UA: 0.2 mg/dL (ref 0.0–1.0)

## 2015-01-27 LAB — CBC
HCT: 46.4 % — ABNORMAL HIGH (ref 36.0–46.0)
Hemoglobin: 14.8 g/dL (ref 12.0–15.0)
MCH: 28.6 pg (ref 26.0–34.0)
MCHC: 31.9 g/dL (ref 30.0–36.0)
MCV: 89.6 fL (ref 78.0–100.0)
PLATELETS: 285 10*3/uL (ref 150–400)
RBC: 5.18 MIL/uL — AB (ref 3.87–5.11)
RDW: 13.8 % (ref 11.5–15.5)
WBC: 9.1 10*3/uL (ref 4.0–10.5)

## 2015-01-27 LAB — I-STAT TROPONIN, ED: TROPONIN I, POC: 0.07 ng/mL (ref 0.00–0.08)

## 2015-01-27 MED ORDER — CARVEDILOL 3.125 MG PO TABS
3.1250 mg | ORAL_TABLET | Freq: Two times a day (BID) | ORAL | Status: DC
Start: 2015-01-27 — End: 2015-01-27
  Administered 2015-01-27: 3.125 mg via ORAL
  Filled 2015-01-27 (×2): qty 1

## 2015-01-27 MED ORDER — SODIUM CHLORIDE 0.9 % IV BOLUS (SEPSIS)
1000.0000 mL | Freq: Once | INTRAVENOUS | Status: AC
  Administered 2015-01-27: 1000 mL via INTRAVENOUS

## 2015-01-27 MED ORDER — DILTIAZEM HCL 25 MG/5ML IV SOLN
15.0000 mg | Freq: Once | INTRAVENOUS | Status: AC
  Administered 2015-01-27: 15 mg via INTRAVENOUS

## 2015-01-27 MED ORDER — DILTIAZEM HCL 100 MG IV SOLR
INTRAVENOUS | Status: AC
  Filled 2015-01-27: qty 100

## 2015-01-27 NOTE — ED Provider Notes (Signed)
CSN: [id number]     Arrival date & time 01/27/15  4098 History   First MD Initiated Contact with Patient 01/27/15 [phone]     Chief Complaint  Patient presents with  . Weakness  . Nausea     (Consider location/radiation/quality/duration/timing/severity/associated sxs/prior Treatment) Patient is a 81 y.o. female presenting with weakness. The history is provided by the patient.  Weakness This is a new problem. The current episode started 3 to 5 hours ago. The problem occurs constantly. The problem has not changed since onset.Pertinent negatives include no chest pain and no shortness of breath. Nothing aggravates the symptoms. Nothing relieves the symptoms.    Past Medical History  Diagnosis Date  . DIVERTICULOSIS, COLON 11/22/2006  . HYPERLIPIDEMIA 11/22/2006  . OSTEOPOROSIS 11/22/2006  . Menopausal syndrome (hot flashes)   . Headache(784.0)   . Cancer Meloanoma left leg  . Hypertension    Past Surgical History  Procedure Laterality Date  . Appendectomy    . Cholecystectomy    . Bunionectomy    . Femur im nail Left 07/08/2014    Procedure: INTRAMEDULLARY (IM) NAIL ;  Surgeon: Meredith Pel, MD;  Location: [facility];  Service: Orthopedics;  Laterality: Left;   Family History  Problem Relation Age of Onset  . Alzheimer's disease Sister   . Cardiomyopathy Brother    Social History  Substance Use Topics  . Smoking status: Never Smoker   . Smokeless tobacco: Never Used  . Alcohol Use: No   OB History    No data available     Review of Systems  Constitutional: Negative for fever and chills.  Respiratory: Negative for cough and shortness of breath.   Cardiovascular: Negative for chest pain.  Neurological: Positive for weakness.  All other systems reviewed and are negative.     Allergies  Lisinopril  Home Medications   Prior to Admission medications   Medication Sig Start Date End Date Taking? Authorizing Provider  acetaminophen (TYLENOL) 500 MG tablet Take 500 mg by  mouth every 6 (six) hours as needed for mild pain or headache.     Historical Provider, MD  atorvastatin (LIPITOR) 40 MG tablet Take 1 tablet (40 mg total) by mouth daily. 04/09/14   Marletta Lor, MD  Calcium Carbonate-Vitamin D (CALTRATE 600+D PO) Take 1 tablet by mouth daily.      Historical Provider, MD  carvedilol (COREG) 3.125 MG tablet Take 1 tablet (3.125 mg total) by mouth 2 (two) times daily with a meal. 12/09/14   Ripudeep Krystal Eaton, MD  losartan (COZAAR) 100 MG tablet Take 1 tablet (100 mg total) by mouth daily. 04/09/14   Marletta Lor, MD  Polyvinyl Alcohol-Povidone (REFRESH OP) Place 1 drop into both eyes 2 (two) times daily.    Historical Provider, MD  Rivaroxaban (XARELTO) 15 MG TABS tablet Take 1 tablet (15 mg total) by mouth daily with supper. 12/16/14   Josue Hector, MD   BP 140/115 mmHg  Pulse 155  Temp(Src) 97.1 F (36.2 C)  Resp 18  SpO2 100% Physical Exam  Constitutional: She is oriented to person, place, and time. She appears well-developed and well-nourished. No distress.  HENT:  Head: Normocephalic and atraumatic.  Mouth/Throat: Oropharynx is clear and moist.  Eyes: EOM are normal. Pupils are equal, round, and reactive to light.  Neck: Normal range of motion. Neck supple.  Cardiovascular: An irregularly irregular rhythm present. Tachycardia present.  Exam reveals no friction rub.   No murmur heard. Pulmonary/Chest: Effort normal  and breath sounds normal. No respiratory distress. She has no wheezes. She has no rales.  Abdominal: Soft. She exhibits no distension. There is no tenderness. There is no rebound.  Musculoskeletal: Normal range of motion. She exhibits no edema.  Neurological: She is alert and oriented to person, place, and time.  Skin: No rash noted. She is not diaphoretic.  Nursing note and vitals reviewed.   ED Course  Procedures (including critical care time) Labs Review Labs Reviewed  BASIC METABOLIC PANEL  CBC  URINALYSIS, ROUTINE W  REFLEX MICROSCOPIC (NOT AT [facility])  Randolm Idol, ED    Imaging Review Dg Chest Portable 1 View  01/27/2015   CLINICAL DATA:  Hypertension.  Atrial fibrillation  EXAM: PORTABLE CHEST - 1 VIEW  COMPARISON:  December 07, 2014  FINDINGS: Lungs are clear. Heart size and pulmonary vascularity are normal. No pneumothorax. No adenopathy. No bone lesions. There is atherosclerotic calcification in the aortic arch region.  IMPRESSION: No edema or consolidation.   Electronically Signed   By: Lowella Grip III M.D.   On: 01/27/2015 09:18   I have personally reviewed and evaluated these images and lab results as part of my medical decision-making.   EKG Interpretation   Date/Time:  Wednesday January 27 2015 08:17:47 EDT Ventricular Rate:  162 PR Interval:    QRS Duration: 79 QT Interval:  290 QTC Calculation: 476 R Axis:   -26 Text Interpretation:  Atrial fibrillation with rapid V-rate Borderline  left axis deviation Abnormal R-wave progression, early transition  Repolarization abnormality, prob rate related Baseline wander in lead(s)  III aVL Similar to prior from july 2016 Confirmed by Mingo Amber  MD, [place]  [phone]) on 01/27/2015 8:34:55 AM      MDM   Final diagnoses:  Atrial fibrillation with RVR    81 year old female with history of A. fib on Coreg for rate control presents with weakness, sweating, generalized malaise. She denies any chest pain or shortness of breath. She had a few episodes of vomiting. This all began when she will cut this morning. She denies any missed medications. Here she is in A. fib with RVR with rates varying between the high 130s to the 160s. EKG looks similar to that of 11/25/2014. Blood pressure is normal. She received IV Lopressor previously but then had some profound bradycardia, so we'll use IV diltiazem for rate control. She is on rivaroxaban for anti-coagulation. She is much more rate controlled after Dilt bolus, no hypotension. Given her Coreg. Cards will see in  follow-up. Stable for discharge.    Evelina Bucy, MD 01/27/15 [id number]

## 2015-01-27 NOTE — ED Notes (Signed)
Pt here for weakness, vomiting that stared this am. sts she feels like her heart is out of rhythm.

## 2015-01-27 NOTE — ED Notes (Signed)
Discharge pt.IV

## 2015-01-27 NOTE — Discharge Instructions (Signed)

## 2015-02-05 DIAGNOSIS — L821 Other seborrheic keratosis: Secondary | ICD-10-CM | POA: Diagnosis not present

## 2015-02-08 ENCOUNTER — Ambulatory Visit (INDEPENDENT_AMBULATORY_CARE_PROVIDER_SITE_OTHER): Payer: Medicare Other | Admitting: Nurse Practitioner

## 2015-02-08 ENCOUNTER — Encounter: Payer: Self-pay | Admitting: Nurse Practitioner

## 2015-02-08 VITALS — BP 140/68 | HR 54 | Ht 65.0 in | Wt 126.8 lb

## 2015-02-08 DIAGNOSIS — I10 Essential (primary) hypertension: Secondary | ICD-10-CM | POA: Diagnosis not present

## 2015-02-08 DIAGNOSIS — Z7901 Long term (current) use of anticoagulants: Secondary | ICD-10-CM | POA: Diagnosis not present

## 2015-02-08 DIAGNOSIS — I4891 Unspecified atrial fibrillation: Secondary | ICD-10-CM | POA: Diagnosis not present

## 2015-02-08 DIAGNOSIS — I480 Paroxysmal atrial fibrillation: Secondary | ICD-10-CM | POA: Diagnosis not present

## 2015-02-08 NOTE — Progress Notes (Signed)
CARDIOLOGY OFFICE NOTE  Date:  02/08/2015    [name] Small Date of Birth: 04/07/1929 Medical Record #[id number]  PCP:  Nyoka Cowden, MD  Cardiologist:  Johnsie Cancel    Chief Complaint  Patient presents with  . Atrial Fibrillation    Post ER visit - seen for Dr. Johnsie Cancel    History of Present Illness: [name] Small is a 81 y.o. female who presents today for a post ER visit/follow up. Seen for Dr. Johnsie Cancel. She has a history of PAF, HTN, HLD, and DM. She had a hip fracture and was on coumadin back in February following left intratrochanteric fracture. She had missed steps in her garage.   Admitted in July with AF. She was given IV Lopressor with improvement in her heart rate. Her troponins were elevated. Cardiology was consulted. However after the Lopressor, patient did have bradycardia with heart rate in 40s, cardiology, Dr. Debara Pickett recommended to discontinue metoprolol and placed on Coreg. Her 2-D echocardiogram showed EF of 55-60%, normal wall motion. - CHADS-VASC 4-5, patient was started on Eliquis 2.5mg  BID. Aspirin was discontinued. Cardiology recommended 48 hours monitor to rule out sick sinus syndrome.   Phone call back in August was to report some sores in her mouth along with bleeding - was switched to Xarelto 15 mg per Dr. Johnsie Cancel.   I saw her about 6 weeks ago - she was doing ok. Remained fatigued but that seemed to be a chronic issue.   In the ER about 10 days ago. Had recurrent AF - treated with Diltiazem. She felt better after the Diltiazem. Was told to follow back up here. Does not look like she had any medicine changes  Comes back today. Here with her daughter in law. Doing ok. Still fatigued. No palpitations. Some dizziness. HR remains in the low 50's just on low dose Coreg. She admits she is a little down in the dumps - since her hip fracture - has had to stop working, living situation is different - has just had a lot to cope with. Not eating. Continues to lose weight.  She weighed 146 last fall.    Past Medical History  Diagnosis Date  . DIVERTICULOSIS, COLON 11/22/2006  . HYPERLIPIDEMIA 11/22/2006  . OSTEOPOROSIS 11/22/2006  . Menopausal syndrome (hot flashes)   . Headache(784.0)   . Cancer Meloanoma left leg  . Hypertension     Past Surgical History  Procedure Laterality Date  . Appendectomy    . Cholecystectomy    . Bunionectomy    . Femur im nail Left 07/08/2014    Procedure: INTRAMEDULLARY (IM) NAIL ;  Surgeon: Meredith Pel, MD;  Location: [facility];  Service: Orthopedics;  Laterality: Left;     Medications: Current Outpatient Prescriptions  Medication Sig Dispense Refill  . acetaminophen (TYLENOL) 500 MG tablet Take 500 mg by mouth every 6 (six) hours as needed for mild pain or headache.     [name] amoxicillin (AMOXIL) 500 MG capsule TK ONE C PO  QID UNTIL FINISHED  0  . atorvastatin (LIPITOR) 40 MG tablet Take 1 tablet (40 mg total) by mouth daily. 90 tablet 3  . Calcium Carbonate-Vitamin D (CALTRATE 600+D PO) Take 1 tablet by mouth daily.      . carvedilol (COREG) 3.125 MG tablet Take 1 tablet (3.125 mg total) by mouth 2 (two) times daily with a meal. 60 tablet 2  . losartan (COZAAR) 100 MG tablet Take 1 tablet (100 mg total) by mouth daily. [place]  tablet 3  . Polyvinyl Alcohol-Povidone (REFRESH OP) Place 1 drop into both eyes 2 (two) times daily.    . Rivaroxaban (XARELTO) 15 MG TABS tablet Take 1 tablet (15 mg total) by mouth daily with supper. 30 tablet 11   No current facility-administered medications for this visit.    Allergies: Allergies  Allergen Reactions  . Lisinopril     Cough    Social History: The patient  reports that she has never smoked. She has never used smokeless tobacco. She reports that she does not drink alcohol or use illicit drugs.   Family History: The patient's family history includes Alzheimer's disease in her sister; Cardiomyopathy in her brother.   Review of Systems: Please see the history of present  illness.   Otherwise, the review of systems is positive for none.   All other systems are reviewed and negative.   Physical Exam: VS:  BP 140/68 mmHg  Pulse 54  Ht 5\' 5"  (1.651 m)  Wt 126 lb 12.8 oz (57.516 kg)  BMI 21.10 kg/m2 .  BMI Body mass index is 21.1 kg/(m^2).  Wt Readings from Last 3 Encounters:  02/08/15 126 lb 12.8 oz (57.516 kg)  12/21/14 128 lb (58.06 kg)  12/17/14 129 lb (58.514 kg)    General: Pleasant. Well developed, well nourished and in no acute distress. She seems a little blue/sad to me today.  HEENT: Normal. Neck: Supple, no JVD, carotid bruits, or masses noted.  Cardiac: Regular rate and rhythm. No murmurs, rubs, or gallops. No edema.  Respiratory:  Lungs are clear to auscultation bilaterally with normal work of breathing.  GI: Soft and nontender.  MS: No deformity or atrophy. Gait and ROM intact. Skin: Warm and dry. Color is normal.  Neuro:  Strength and sensation are intact and no gross focal deficits noted.  Psych: Alert, appropriate and with normal affect.   LABORATORY DATA:  EKG:  EKG is ordered today. This demonstrates sinus bradycardia.  Lab Results  Component Value Date   WBC 9.1 01/27/2015   HGB 14.8 01/27/2015   HCT 46.4* 01/27/2015   PLT 285 01/27/2015   GLUCOSE 182* 01/27/2015   CHOL 277* 04/07/2014   TRIG 114.0 04/07/2014   HDL 62.00 04/07/2014   LDLDIRECT 176.0 02/24/2008   LDLCALC 192* 04/07/2014   ALT 18 04/07/2014   AST 29 04/07/2014   NA 142 01/27/2015   K 3.7 01/27/2015   CL 106 01/27/2015   CREATININE 1.22* 01/27/2015   BUN 12 01/27/2015   CO2 25 01/27/2015   TSH 1.019 12/07/2014   INR 2.4 08/10/2014   HGBA1C 6.1* 12/07/2014    BNP (last 3 results) No results for input(s): BNP in the last 8760 hours.  ProBNP (last 3 results) No results for input(s): PROBNP in the last 8760 hours.   Other Studies Reviewed Today:  Notes Recorded by Josue Hector, MD on 12/15/2014 at 2:34 PM Monitor ok no significant arrhythmias  just PAC;s and PVC;s     Echo Study Conclusions from July 2017  - Left ventricle: The cavity size was normal. Wall thickness was normal. Systolic function was normal. The estimated ejection fraction was in the range of 55% to 60%. Wall motion was normal; there were no regional wall motion abnormalities. Doppler parameters are consistent with high ventricular filling pressure. - Aortic valve: There was trivial regurgitation. - Left atrium: The atrium was mildly dilated.  Impressions:  - Normal LV function; elevated LV filling pressure; mild LAE; trace AI, MR and  TR.   Assessment/Plan: 1. PAF - Recent recurrence - HR limiting our options. Will refer to AF clinic to discuss possible AAD therapy. May at some point need PPM in order to increase rate slowing medicines. She is anticoagulated.   2. Chronic anticoagulation with Xarelto - she did not tolerate Eliquis. Tolerating without issue at present  3. HTN - BP is good on her current regimen.   4. HLD - on statin  5. Bradycardia - recent monitor ok - no profound bradycardia noted but her medicines have limited how much medicine we can have her on. Rate ok by EKG today.   6. Probable depression - she has had a lot to deal with/cope with since her hip fracture. May need antidepressant - she will discuss with Dr. Raliegh Ip.         Current medicines are reviewed with the patient today.  The patient does not have concerns regarding medicines other than what has been noted above.  The following changes have been made:  See above.  Labs/ tests ordered today include:   No orders of the defined types were placed in this encounter.     Disposition:   FU with Dr. Johnsie Cancel as planned. Will refer on to the AF clinic for other options.    Patient is agreeable to this plan and will call if any problems develop in the interim.   Signed: Burtis Junes, RN, ANP-C 02/08/2015 11:54 AM  [facility] [address] [place], [place]  [address] Phone: [phone] Fax: [phone]

## 2015-02-08 NOTE — Patient Instructions (Addendum)
We will be checking the following labs today - NONE   Medication Instructions:    Continue with your current medicines.     Testing/Procedures To Be Arranged:  N/A  Follow-Up:   We will get you an appointment in the Atrial Fib clinic at the hospital    Other Special Instructions:   N/A  Call the [place] office at [phone] if you have any questions, problems or concerns.

## 2015-02-23 ENCOUNTER — Ambulatory Visit (HOSPITAL_COMMUNITY)
Admission: RE | Admit: 2015-02-23 | Discharge: 2015-02-23 | Disposition: A | Discharge status: Home / Self Care | Payer: Medicare Other | Source: Ambulatory Visit | Attending: Nurse Practitioner | Admitting: Nurse Practitioner

## 2015-02-23 ENCOUNTER — Other Ambulatory Visit (HOSPITAL_COMMUNITY): Payer: Self-pay | Admitting: *Deleted

## 2015-02-23 VITALS — BP 160/82 | HR 65 | Ht 65.0 in | Wt 127.4 lb

## 2015-02-23 DIAGNOSIS — Z79899 Other long term (current) drug therapy: Secondary | ICD-10-CM | POA: Diagnosis not present

## 2015-02-23 DIAGNOSIS — I4891 Unspecified atrial fibrillation: Secondary | ICD-10-CM | POA: Diagnosis present

## 2015-02-23 DIAGNOSIS — I480 Paroxysmal atrial fibrillation: Secondary | ICD-10-CM

## 2015-02-23 DIAGNOSIS — E785 Hyperlipidemia, unspecified: Secondary | ICD-10-CM | POA: Diagnosis not present

## 2015-02-23 DIAGNOSIS — I10 Essential (primary) hypertension: Secondary | ICD-10-CM | POA: Diagnosis not present

## 2015-02-23 DIAGNOSIS — Z7902 Long term (current) use of antithrombotics/antiplatelets: Secondary | ICD-10-CM | POA: Insufficient documentation

## 2015-02-23 MED ORDER — DRONEDARONE HCL 400 MG PO TABS: 400.0000 mg | ORAL_TABLET | Freq: Two times a day (BID) | ORAL | Status: DC

## 2015-02-24 ENCOUNTER — Encounter (HOSPITAL_COMMUNITY): Payer: Self-pay | Admitting: Nurse Practitioner

## 2015-02-24 NOTE — Progress Notes (Signed)
Patient ID: [name] Small, female   DOB: Oct 07, 1928, 81 y.o.   MRN: [id number]     Primary Care Physician: Nyoka Cowden, MD Referring Physician: Truitt Merle, NP   [name] Small is a 81 y.o. female with a h/o PAF that was first diagnosed 7/18 with PAF. Has has a past history of a fall in 2/16 with subsequent hip fracture and had been on coumadin for a short period of time. time. She was d/c on eliquis but did not tolerate and is now on 15 mg of xarelto. She does have some issues with bradycardia when is SR.  She was d/c on low dose carvedilol. She then had a ER visit 9/7 for another episode of afib with rvr at 160 bpm which left her feeling weak and sweaty. She converted with IV diltiazem and was d/c home. She is here to discuss possible antiarrythmic's with her daughter-in-law today.  Today, she denies symptoms of palpitations, chest pain, shortness of breath, orthopnea, PND, lower extremity edema, dizziness, presyncope, syncope, or neurologic sequela. The patient is tolerating medications without difficulties and is otherwise without complaint today.   Past Medical History  Diagnosis Date  . DIVERTICULOSIS, COLON 11/22/2006  . HYPERLIPIDEMIA 11/22/2006  . OSTEOPOROSIS 11/22/2006  . Menopausal syndrome (hot flashes)   . Headache(784.0)   . Cancer ([place]) Meloanoma left leg  . Hypertension    Past Surgical History  Procedure Laterality Date  . Appendectomy    . Cholecystectomy    . Bunionectomy    . Femur im nail Left 07/08/2014    Procedure: INTRAMEDULLARY (IM) NAIL ;  Surgeon: Meredith Pel, MD;  Location: [facility];  Service: Orthopedics;  Laterality: Left;    Current Outpatient Prescriptions  Medication Sig Dispense Refill  . acetaminophen (TYLENOL) 500 MG tablet Take 500 mg by mouth every 6 (six) hours as needed for mild pain or headache.     [name] atorvastatin (LIPITOR) 40 MG tablet Take 1 tablet (40 mg total) by mouth daily. 90 tablet 3  . Calcium Carbonate-Vitamin D (CALTRATE  600+D PO) Take 1 tablet by mouth daily.      [name] losartan (COZAAR) 100 MG tablet Take 1 tablet (100 mg total) by mouth daily. 90 tablet 3  . Polyvinyl Alcohol-Povidone (REFRESH OP) Place 1 drop into both eyes 2 (two) times daily.    . Rivaroxaban (XARELTO) 15 MG TABS tablet Take 1 tablet (15 mg total) by mouth daily with supper. 30 tablet 11  . dronedarone (MULTAQ) 400 MG tablet Take 1 tablet (400 mg total) by mouth 2 (two) times daily with a meal. 60 tablet 3   No current facility-administered medications for this encounter.    Allergies  Allergen Reactions  . Lisinopril     Cough    Social History   Social History  . Marital Status: Widowed    Spouse Name: N/A  . Number of Children: N/A  . Years of Education: N/A   Occupational History  . Not on file.   Social History Main Topics  . Smoking status: Never Smoker   . Smokeless tobacco: Never Used  . Alcohol Use: No  . Drug Use: No  . Sexual Activity: No   Other Topics Concern  . Not on file   Social History Narrative    Family History  Problem Relation Age of Onset  . Alzheimer's disease Sister   . Cardiomyopathy Brother     ROS- All systems are reviewed and negative except as per the  HPI above  Physical Exam: Filed Vitals:   02/23/15 0956  BP: 160/82  Pulse: 65  Height: 5\' 5"  (1.651 m)  Weight: 127 lb 6.4 oz (57.788 kg)    GEN- The patient is well appearing, alert and oriented x 3 today.   Head- normocephalic, atraumatic Eyes-  Sclera clear, conjunctiva pink Ears- hearing intact Oropharynx- clear Neck- supple, no JVP Lymph- no cervical lymphadenopathy Lungs- Clear to ausculation bilaterally, normal work of breathing Heart- Regular rate and rhythm, no murmurs, rubs or gallops, PMI not laterally displaced GI- soft, NT, ND, + BS Extremities- no clubbing, cyanosis, or edema MS- no significant deformity or atrophy Skin- no rash or lesion Psych- euthymic mood, full affect Neuro- strength and sensation  are intact  EKG- NSR with sinus arrhythmia, IRBBB, LAFB, v rare 65 bpm, PR int 144 ms, QRS 92 ms, QTc 403 ms  Epic records reviewed, labs, ech. Past 2 ER visits and Cecille Rubin Gerhardt's note  Echo-Left ventricle: The cavity size was normal. Wall thickness was normal. Systolic function was normal. The estimated ejection fraction was in the range of 55% to 60%. Wall motion was normal; there were no regional wall motion abnormalities. Doppler parameters are consistent with high ventricular filling pressure. - Aortic valve: There was trivial regurgitation. - Left atrium: The atrium was mildly dilated.  Impressions:  - Normal LV function; elevated LV filling pressure; mild LAE; trace AI, MR and TR.  Assessment and Plan: 1. PAF Long discussion re afib and consequences/ treatment of same. Discussed possible antiarrythmic's. She is not a 1c candidate due to IRBBB/LAFB. She is concerned with S.E. Of amiodarone and does not think she could afford tikosyn. Sotalol would probably cause profound bradycardia. She is willing to try multaq 400 mg bid with food and stop carvedilol due to possible bradycardia with both. She will be seen back in the office in one week for tolerance and will obtain liver profile at that time.  Continue Crane Carroll, [facility] [address] [place], [place] [address] [phone]

## 2015-03-02 ENCOUNTER — Ambulatory Visit (HOSPITAL_COMMUNITY)
Admission: RE | Admit: 2015-03-02 | Discharge: 2015-03-02 | Disposition: A | Discharge status: Home / Self Care | Payer: Medicare Other | Source: Ambulatory Visit | Attending: Nurse Practitioner | Admitting: Nurse Practitioner

## 2015-03-02 ENCOUNTER — Ambulatory Visit (INDEPENDENT_AMBULATORY_CARE_PROVIDER_SITE_OTHER): Payer: Medicare Other

## 2015-03-02 ENCOUNTER — Encounter (HOSPITAL_COMMUNITY): Payer: Self-pay | Admitting: Nurse Practitioner

## 2015-03-02 VITALS — BP 138/76 | HR 51 | Ht 65.0 in | Wt 126.0 lb

## 2015-03-02 DIAGNOSIS — I4891 Unspecified atrial fibrillation: Secondary | ICD-10-CM | POA: Diagnosis not present

## 2015-03-02 DIAGNOSIS — R001 Bradycardia, unspecified: Secondary | ICD-10-CM | POA: Insufficient documentation

## 2015-03-02 DIAGNOSIS — Z7901 Long term (current) use of anticoagulants: Secondary | ICD-10-CM | POA: Insufficient documentation

## 2015-03-02 DIAGNOSIS — I480 Paroxysmal atrial fibrillation: Secondary | ICD-10-CM | POA: Insufficient documentation

## 2015-03-02 NOTE — Progress Notes (Signed)
Patient ID: [name] Small, female   DOB: [birth year], 81 y.o.   MRN: [id number]     Primary Care Physician: Nyoka Cowden, MD Referring Physician: Truitt Merle, NP   [name] Small is a 81 y.o. female with a h/o PAF that was first diagnosed 7/18 with PAF. Has has a past history of a fall in 2/16 with subsequent hip fracture and had been on coumadin for a short period of time. time. She was d/c on eliquis but did not tolerate and is now on 15 mg of xarelto. She does have some issues with bradycardia when is SR.  She was d/c on low dose carvedilol. She then had a ER visit 9/7 for another episode of afib with rvr at 160 bpm which left her feeling weak and sweaty. She converted with IV diltiazem and was d/c home. She is here to discuss possible antiarrythmic's with her daughter-in-law today.   After a discussion re AAD's, she wanted to try multaq. She comes back today with SR but with v rate of 51. She is not symptomatic with that rate but does have a tendency for bradycardia. Discussed 48 hour monitor and she is in agreement to further evaluate. She has not noticed any further afib. She is tolerating multaq.  Today, she denies symptoms of palpitations, chest pain, shortness of breath, orthopnea, PND, lower extremity edema, dizziness, presyncope, syncope, or neurologic sequela.  Mild dizziness with getting out of bed in am.The patient is tolerating medications without difficulties and is otherwise without complaint today.   Past Medical History  Diagnosis Date  . DIVERTICULOSIS, COLON 11/22/2006  . HYPERLIPIDEMIA 11/22/2006  . OSTEOPOROSIS 11/22/2006  . Menopausal syndrome (hot flashes)   . Headache(784.0)   . Cancer ([place]) Meloanoma left leg  . Hypertension    Past Surgical History  Procedure Laterality Date  . Appendectomy    . Cholecystectomy    . Bunionectomy    . Femur im nail Left 07/08/2014    Procedure: INTRAMEDULLARY (IM) NAIL ;  Surgeon: Meredith Pel, MD;  Location: [facility];   Service: Orthopedics;  Laterality: Left;    Current Outpatient Prescriptions  Medication Sig Dispense Refill  . acetaminophen (TYLENOL) 500 MG tablet Take 500 mg by mouth every 6 (six) hours as needed for mild pain or headache.     [name] atorvastatin (LIPITOR) 40 MG tablet Take 1 tablet (40 mg total) by mouth daily. 90 tablet 3  . Calcium Carbonate-Vitamin D (CALTRATE 600+D PO) Take 1 tablet by mouth daily.      [name] dronedarone (MULTAQ) 400 MG tablet Take 1 tablet (400 mg total) by mouth 2 (two) times daily with a meal. 60 tablet 3  . losartan (COZAAR) 100 MG tablet Take 1 tablet (100 mg total) by mouth daily. 90 tablet 3  . Polyvinyl Alcohol-Povidone (REFRESH OP) Place 1 drop into both eyes 2 (two) times daily.    . Rivaroxaban (XARELTO) 15 MG TABS tablet Take 1 tablet (15 mg total) by mouth daily with supper. 30 tablet 11   No current facility-administered medications for this encounter.    Allergies  Allergen Reactions  . Lisinopril     Cough    Social History   Social History  . Marital Status: Widowed    Spouse Name: N/A  . Number of Children: N/A  . Years of Education: N/A   Occupational History  . Not on file.   Social History Main Topics  . Smoking status: Never Smoker   .  Smokeless tobacco: Never Used  . Alcohol Use: No  . Drug Use: No  . Sexual Activity: No   Other Topics Concern  . Not on file   Social History Narrative    Family History  Problem Relation Age of Onset  . Alzheimer's disease Sister   . Cardiomyopathy Brother     ROS- All systems are reviewed and negative except as per the HPI above  Physical Exam: Filed Vitals:   03/02/15 0954  BP: 138/76  Pulse: 51  Height: 5\' 5"  (1.651 m)  Weight: 126 lb (57.153 kg)    GEN- The patient is well appearing, alert and oriented x 3 today.   Head- normocephalic, atraumatic Eyes-  Sclera clear, conjunctiva pink Ears- hearing intact Oropharynx- clear Neck- supple, no JVP Lymph- no cervical  lymphadenopathy Lungs- Clear to ausculation bilaterally, normal work of breathing Heart- Regular rate and rhythm, no murmurs, rubs or gallops, PMI not laterally displaced GI- soft, NT, ND, + BS Extremities- no clubbing, cyanosis, or edema MS- no significant deformity or atrophy Skin- no rash or lesion Psych- euthymic mood, full affect Neuro- strength and sensation are intact  EKG- SR with pac x one, possible RVH,  v rare 51bpm, PR int 130 ms, QRS 90 ms, QTc 407 ms   Echo-Left ventricle: The cavity size was normal. Wall thickness was normal. Systolic function was normal. The estimated ejection fraction was in the range of 55% to 60%. Wall motion was normal; there were no regional wall motion abnormalities. Doppler parameters are consistent with high ventricular filling pressure. - Aortic valve: There was trivial regurgitation. - Left atrium: The atrium was mildly dilated.  Impressions:  - Normal LV function; elevated LV filling pressure; mild LAE; trace AI, MR and TR.  Assessment and Plan: 1. PAF Continue multaq.  Continue xarelto  2, Bradycardia  48 hour monitor  F/u in afib clinic x 2 weeks  Geroge Baseman. Rayson Rando, [facility] [address] [place], [place] [address] [phone]

## 2015-03-09 ENCOUNTER — Ambulatory Visit (HOSPITAL_COMMUNITY)
Admission: RE | Admit: 2015-03-09 | Discharge: 2015-03-09 | Disposition: A | Discharge status: Home / Self Care | Payer: Medicare Other | Source: Ambulatory Visit | Attending: Nurse Practitioner | Admitting: Nurse Practitioner

## 2015-03-09 ENCOUNTER — Encounter (HOSPITAL_COMMUNITY): Payer: Self-pay | Admitting: Nurse Practitioner

## 2015-03-09 VITALS — BP 140/72 | HR 52 | Ht 65.0 in | Wt 126.6 lb

## 2015-03-09 DIAGNOSIS — R001 Bradycardia, unspecified: Secondary | ICD-10-CM | POA: Insufficient documentation

## 2015-03-09 DIAGNOSIS — I480 Paroxysmal atrial fibrillation: Secondary | ICD-10-CM | POA: Insufficient documentation

## 2015-03-09 LAB — HEPATIC FUNCTION PANEL
ALK PHOS: 75 U/L (ref 38–126)
ALT: 12 U/L — AB (ref 14–54)
AST: 15 U/L (ref 15–41)
Albumin: 3.6 g/dL (ref 3.5–5.0)
BILIRUBIN DIRECT: 0.1 mg/dL (ref 0.1–0.5)
BILIRUBIN INDIRECT: 0.4 mg/dL (ref 0.3–0.9)
BILIRUBIN TOTAL: 0.5 mg/dL (ref 0.3–1.2)
Total Protein: 7 g/dL (ref 6.5–8.1)

## 2015-03-09 NOTE — Patient Instructions (Signed)
Melissa will be in touch with you regarding appointment with Dr. Curt Bears.

## 2015-03-09 NOTE — Progress Notes (Signed)
Patient ID: [name] Small, female   DOB: 04-Jun-1928, 81 y.o.   MRN: [id number]   Primary Care Physician: Nyoka Cowden, MD Referring Physician: Truitt Merle, NP    [name] Small is a 81 y.o. female with h/o PAF that was first diagnosed 7/18 with PAF. Has has a past history of a fall in 2/16 with subsequent hip fracture and had been on coumadin for a short period of time.  She was d/c on eliquis but did not tolerate and is now on 15 mg of xarelto. She does have some issues with bradycardia when is SR. She was d/c on low dose carvedilol. She then had a ER visit 9/7 for another episode of afib with rvr at 160 bpm which left her feeling weak and sweaty.   She was started on Multaq and is currently tolerating without any further afib. Carvedilol was stopped with the addition of multaq due to concerns re bradycardia. She also wore a holter monitor which showed some concerns for brady with pauses and pt will be sent to EP for further evaluation. She, however is asymptomatic re spells of dizziness, presyncope.   Today, she denies symptoms of palpitations, chest pain, shortness of breath, orthopnea, PND, lower extremity edema, dizziness, presyncope, syncope, or neurologic sequela. The patient is tolerating medications without difficulties and is otherwise without complaint today.   Past Medical History  Diagnosis Date  . DIVERTICULOSIS, COLON 11/22/2006  . HYPERLIPIDEMIA 11/22/2006  . OSTEOPOROSIS 11/22/2006  . Menopausal syndrome (hot flashes)   . Headache(784.0)   . Cancer ([place]) Meloanoma left leg  . Hypertension    Past Surgical History  Procedure Laterality Date  . Appendectomy    . Cholecystectomy    . Bunionectomy    . Femur im nail Left 07/08/2014    Procedure: INTRAMEDULLARY (IM) NAIL ;  Surgeon: Meredith Pel, MD;  Location: [facility];  Service: Orthopedics;  Laterality: Left;    Current Outpatient Prescriptions  Medication Sig Dispense Refill  . acetaminophen (TYLENOL) 500 MG tablet  Take 500 mg by mouth every 6 (six) hours as needed for mild pain or headache.     [name] atorvastatin (LIPITOR) 40 MG tablet Take 1 tablet (40 mg total) by mouth daily. 90 tablet 3  . Calcium Carbonate-Vitamin D (CALTRATE 600+D PO) Take 1 tablet by mouth daily.      [name] dronedarone (MULTAQ) 400 MG tablet Take 1 tablet (400 mg total) by mouth 2 (two) times daily with a meal. 60 tablet 3  . losartan (COZAAR) 100 MG tablet Take 1 tablet (100 mg total) by mouth daily. 90 tablet 3  . Polyvinyl Alcohol-Povidone (REFRESH OP) Place 1 drop into both eyes 2 (two) times daily.    . Rivaroxaban (XARELTO) 15 MG TABS tablet Take 1 tablet (15 mg total) by mouth daily with supper. 30 tablet 11   No current facility-administered medications for this encounter.    Allergies  Allergen Reactions  . Lisinopril     Cough    Social History   Social History  . Marital Status: Widowed    Spouse Name: N/A  . Number of Children: N/A  . Years of Education: N/A   Occupational History  . Not on file.   Social History Main Topics  . Smoking status: Never Smoker   . Smokeless tobacco: Never Used  . Alcohol Use: No  . Drug Use: No  . Sexual Activity: No   Other Topics Concern  . Not on file  Social History Narrative    Family History  Problem Relation Age of Onset  . Alzheimer's disease Sister   . Cardiomyopathy Brother     ROS- All systems are reviewed and negative except as per the HPI above  Physical Exam: Filed Vitals:   03/09/15 1038  BP: 140/72  Pulse: 52  Height: 5\' 5"  (1.651 m)  Weight: 126 lb 9.6 oz (57.425 kg)    GEN- The patient is well appearing, alert and oriented x 3 today.   Head- normocephalic, atraumatic Eyes-  Sclera clear, conjunctiva pink Ears- hearing intact Oropharynx- clear Neck- supple, no JVP Lymph- no cervical lymphadenopathy Lungs- Clear to ausculation bilaterally, normal work of breathing Heart- Regular rate and rhythm, no murmurs, rubs or gallops, PMI not  laterally displaced GI- soft, NT, ND, + BS Extremities- no clubbing, cyanosis, or edema MS- no significant deformity or atrophy Skin- no rash or lesion Psych- euthymic mood, full affect Neuro- strength and sensation are intact  EKG-Sinus brady at 52 bpm, pr int 148 ms, QRS int 80 ms, QTc 403 ms, LAFB  Holter monitor: Interpreted by Dr. Johnsie Cancel, Sinus rhythm sinus bradycardia PVC;s with slow bigeminny. If PVC;s not perfused effective HR as low as 24 bpm PAC;s and short bursts of atrial tachycardia less than 10 beats Average HR 54 bpm  Will refer to EP   Assessment and Plan: 1. PAF Continue on multaq, no further afib Continue on xarelto Liver panel today unremarkable for use of multaq.  2. Bradycardia WIll refer to EP per Dr. Kyla Balzarine recommendation for bradycardia, possible need for PPM Currently not symptomatic with dizziness, a lot of the brady was seen in the early AM hours.  F/u in the afib clinic as needed  [place]. Johnson Arizola, [facility] [address] [place], [place] [address] [phone]

## 2015-03-22 NOTE — Progress Notes (Signed)
Electrophysiology Office Note   Date:  03/22/2015   ID:  [name] Small, DOB May 19, 1929, MRN [id number]  PCP:  [name] Cowden, MD  Primary Electrophysiologist: [name] Meddings [name] Leeds, MD    No chief complaint on file.    History of Present Illness: [name] Small is a 81 y.o. female who presents today for electrophysiology evaluation.   She is a 81 y.o. female with h/o PAF that was first diagnosed 7/18 with PAF. Has has a past history of a fall in 2/16 with subsequent hip fracture and had been on coumadin for a short period of time. She does have some issues with bradycardia when is SR. She is bradycardic at night when sleeping.  She was d/c on low dose carvedilol. She then had a ER visit 9/7 for another episode of afib with rvr at 160 bpm which left her feeling weak and sweaty.   She was started on Multaq and is currently tolerating without any further afib. Carvedilol was stopped with the addition of multaq due to concerns re bradycardia. She also wore a holter monitor which showed some concerns for brady with pauses and pt [name] Small be sent to EP for further evaluation. She, however is asymptomatic re spells of dizziness, presyncope.  She states that she is able to do her daily activities without any issues and that she feels well now on the multaq.    Today, she denies symptoms of palpitations, chest pain, shortness of breath, orthopnea, PND, lower extremity edema, claudication, dizziness, presyncope, syncope, bleeding, or neurologic sequela. The patient is tolerating medications without difficulties and is otherwise without complaint today.    Past Medical History  Diagnosis Date  . DIVERTICULOSIS, COLON 11/22/2006  . HYPERLIPIDEMIA 11/22/2006  . OSTEOPOROSIS 11/22/2006  . Menopausal syndrome (hot flashes)   . Headache(784.0)   . Cancer ([place]) Meloanoma left leg  . Hypertension    Past Surgical History  Procedure Laterality Date  . Appendectomy    . Cholecystectomy    . Bunionectomy     . Femur im nail Left 07/08/2014    Procedure: INTRAMEDULLARY (IM) NAIL ;  Surgeon: [name] Pel, MD;  Location: [facility];  Service: Orthopedics;  Laterality: Left;     Current Outpatient Prescriptions  Medication Sig Dispense Refill  . acetaminophen (TYLENOL) 500 MG tablet Take 500 mg by mouth every 6 (six) hours as needed for mild pain or headache.     [name] atorvastatin (LIPITOR) 40 MG tablet Take 1 tablet (40 mg total) by mouth daily. 90 tablet 3  . Calcium Carbonate-Vitamin D (CALTRATE 600+D PO) Take 1 tablet by mouth daily.      [name] dronedarone (MULTAQ) 400 MG tablet Take 1 tablet (400 mg total) by mouth 2 (two) times daily with a meal. 60 tablet 3  . losartan (COZAAR) 100 MG tablet Take 1 tablet (100 mg total) by mouth daily. 90 tablet 3  . Polyvinyl Alcohol-Povidone (REFRESH OP) Place 1 drop into both eyes 2 (two) times daily.    . Rivaroxaban (XARELTO) 15 MG TABS tablet Take 1 tablet (15 mg total) by mouth daily with supper. 30 tablet 11   No current facility-administered medications for this visit.    Allergies:   Lisinopril   Social History:  The patient  reports that she has never smoked. She has never used smokeless tobacco. She reports that she does not drink alcohol or use illicit drugs.   Family History:  The patient's family history includes Alzheimer's disease in her  sister; Cardiomyopathy in her brother.    ROS:  Please see the history of present illness.  All other systems are reviewed and negative.    PHYSICAL EXAM: VS:  There were no vitals taken for this visit. , BMI There is no weight on file to calculate BMI. GEN: Well nourished, well developed, in no acute distress HEENT: normal Neck: no JVD, carotid bruits, or masses Cardiac: RRR; no murmurs, rubs, or gallops,no edema  Respiratory:  clear to auscultation bilaterally, normal work of breathing GI: soft, nontender, nondistended, + BS MS: no deformity or atrophy Skin: warm and dry Neuro:  Strength and  sensation are intact Psych: euthymic mood, full affect  EKG:  EKG is ordered today. The ekg ordered today shows sinus rhythm, LAFB, incomplete RBBB, rate 51  Recent Labs: 12/07/2014: Magnesium 2.1; TSH 1.019 01/27/2015: BUN 12; Creatinine, Ser 1.22*; Hemoglobin 14.8; Platelets 285; Potassium 3.7; Sodium 142 03/09/2015: ALT 12*    Lipid Panel     Component Value Date/Time   CHOL 277* 04/07/2014 0935   TRIG 114.0 04/07/2014 0935   HDL 62.00 04/07/2014 0935   CHOLHDL 4 04/07/2014 0935   VLDL 22.8 04/07/2014 0935   LDLCALC 192* 04/07/2014 0935   LDLDIRECT 176.0 02/24/2008 1424     Wt Readings from Last 3 Encounters:  03/09/15 126 lb 9.6 oz (57.425 kg)  03/02/15 126 lb (57.153 kg)  02/23/15 127 lb 6.4 oz (57.788 kg)      Other studies Reviewed: Additional studies/ records that were reviewed today include: Holter 03/02/15  Review of the above records today demonstrates: Sinus rhythm sinus bradycardia PVC;s with slow bigeminny. If PVC;s not perfused effective HR as low as 24 bpm PAC;s and short bursts of atrial tachycardia less than 10 beats Average HR 54 bpm   TTE - Normal LV function; elevated LV filling pressure; mild LAE; traceAI, MR and TR.  ASSESSMENT AND PLAN:  1. PAF On Xarelto and multaq without recurrent symptoms.  [name] Small make no changes at this time.  2. Bradycardia Has had bradycardia in the early moninig hours on monitor.  When up and around, feels well without major complaints.  [name] Small continue to follow and if she develops symptoms, Dai Apel plan for pacemaker placement.     Current medicines are reviewed at length with the patient today.   The patient does not have concerns regarding her medicines.  The following changes were made today:  none  Labs/ tests ordered today include: none  No orders of the defined types were placed in this encounter.     Disposition:   FU with AF clinic at next scheduled appointment  Signed, [name] Fluharty [name] Leeds, MD    03/22/2015 8:35 PM     [place] [address] [phone] (office) [phone] (fax)

## 2015-03-23 ENCOUNTER — Ambulatory Visit (INDEPENDENT_AMBULATORY_CARE_PROVIDER_SITE_OTHER): Payer: Medicare Other | Admitting: Cardiology

## 2015-03-23 ENCOUNTER — Encounter: Payer: Self-pay | Admitting: Cardiology

## 2015-03-23 VITALS — BP 126/78 | HR 51 | Ht 65.0 in | Wt 126.2 lb

## 2015-03-23 DIAGNOSIS — R001 Bradycardia, unspecified: Secondary | ICD-10-CM

## 2015-03-23 NOTE — Patient Instructions (Signed)
Medication Instructions:  Your physician recommends that you continue on your current medications as directed. Please refer to the Current Medication list given to you today.  Labwork: None ordered  Testing/Procedures: None ordered  Follow-Up: No follow up is needed at this time with Dr. Curt Bears.  He will see you on an as needed basis.  Your physician wants you to follow-up in: 6 months with AFib clinic You will receive a reminder letter in the mail two months in advance. If you don't receive a letter, please call our office to schedule the follow-up appointment.   Any Other Special Instructions Will Be Listed Below (If Applicable).  If you need a refill on your cardiac medications before your next appointment, please call your pharmacy.  Thank you for choosing [facility]!!   Trinidad Curet, RN [phone]

## 2015-03-30 ENCOUNTER — Ambulatory Visit (INDEPENDENT_AMBULATORY_CARE_PROVIDER_SITE_OTHER): Payer: Medicare Other

## 2015-03-30 DIAGNOSIS — Z23 Encounter for immunization: Secondary | ICD-10-CM | POA: Diagnosis not present

## 2015-04-05 ENCOUNTER — Ambulatory Visit: Payer: Medicare Other | Admitting: Internal Medicine

## 2015-04-21 ENCOUNTER — Ambulatory Visit (INDEPENDENT_AMBULATORY_CARE_PROVIDER_SITE_OTHER): Payer: Medicare Other | Admitting: Internal Medicine

## 2015-04-21 ENCOUNTER — Encounter: Payer: Self-pay | Admitting: Internal Medicine

## 2015-04-21 VITALS — BP 120/70 | HR 64 | Temp 97.8°F | Resp 18 | Ht 65.0 in | Wt 126.5 lb

## 2015-04-21 DIAGNOSIS — R0789 Other chest pain: Secondary | ICD-10-CM | POA: Diagnosis not present

## 2015-04-21 DIAGNOSIS — E785 Hyperlipidemia, unspecified: Secondary | ICD-10-CM

## 2015-04-21 DIAGNOSIS — I4891 Unspecified atrial fibrillation: Secondary | ICD-10-CM | POA: Diagnosis not present

## 2015-04-21 DIAGNOSIS — I10 Essential (primary) hypertension: Secondary | ICD-10-CM | POA: Diagnosis not present

## 2015-04-21 NOTE — Progress Notes (Signed)
Pre visit review using our clinic review tool, if applicable. No additional management support is needed unless otherwise documented below in the visit note. 

## 2015-04-21 NOTE — Patient Instructions (Signed)
Limit your sodium (Salt) intake  Return in 6 months for follow-up  

## 2015-04-21 NOTE — Progress Notes (Signed)
Subjective:    Patient ID: [name] Small, female    DOB: [birth year], 81 y.o.   MRN: [id number]  HPI  81 year old patient who is seen today in follow-up.  She is followed closely by cardiology due to atrial fibrillation.  She is now on rhythm control medications as well as chronic anticoagulation. Yesterday she had a 15 minute episode of rapid heart rate associated with weakness and feeling flushed.  She considered a ED visit, but symptoms eventually resolved She has essential hypertension  On complaint today is some right posterior chest wall pain that seems aggravated by prolonged standing and alleviated by rest  Cardiology follow-up as scheduled in about 6 weeks  Past Medical History  Diagnosis Date  . DIVERTICULOSIS, COLON 11/22/2006  . HYPERLIPIDEMIA 11/22/2006  . OSTEOPOROSIS 11/22/2006  . Menopausal syndrome (hot flashes)   . Headache(784.0)   . Cancer ([place]) Meloanoma left leg  . Hypertension     Social History   Social History  . Marital Status: Widowed    Spouse Name: N/A  . Number of Children: N/A  . Years of Education: N/A   Occupational History  . Not on file.   Social History Main Topics  . Smoking status: Never Smoker   . Smokeless tobacco: Never Used  . Alcohol Use: No  . Drug Use: No  . Sexual Activity: No   Other Topics Concern  . Not on file   Social History Narrative    Past Surgical History  Procedure Laterality Date  . Appendectomy    . Cholecystectomy    . Bunionectomy    . Femur im nail Left 07/08/2014    Procedure: INTRAMEDULLARY (IM) NAIL ;  Surgeon: Meredith Pel, MD;  Location: [facility];  Service: Orthopedics;  Laterality: Left;    Family History  Problem Relation Age of Onset  . Alzheimer's disease Sister   . Cardiomyopathy Brother     Allergies  Allergen Reactions  . Lisinopril Other (See Comments)    Cough    Current Outpatient Prescriptions on File Prior to Visit  Medication Sig Dispense Refill  . acetaminophen (TYLENOL)  500 MG tablet Take 500 mg by mouth every 6 (six) hours as needed for mild pain or headache.     [name] atorvastatin (LIPITOR) 40 MG tablet Take 1 tablet (40 mg total) by mouth daily. 90 tablet 3  . Calcium Carbonate-Vitamin D (CALTRATE 600+D PO) Take 1 tablet by mouth daily.      [name] dronedarone (MULTAQ) 400 MG tablet Take 1 tablet (400 mg total) by mouth 2 (two) times daily with a meal. 60 tablet 3  . losartan (COZAAR) 100 MG tablet Take 1 tablet (100 mg total) by mouth daily. 90 tablet 3  . Polyvinyl Alcohol-Povidone (REFRESH OP) Place 1 drop into both eyes 2 (two) times daily.    . Rivaroxaban (XARELTO) 15 MG TABS tablet Take 1 tablet (15 mg total) by mouth daily with supper. 30 tablet 11   No current facility-administered medications on file prior to visit.    BP 120/70 mmHg  Pulse 64  Temp(Src) 97.8 F (36.6 C) (Oral)  Resp 18  Ht 5\' 5"  (1.651 m)  Wt 126 lb 8 oz (57.38 kg)  BMI 21.05 kg/m2  SpO2 98%     Review of Systems  Constitutional: Negative.   HENT: Negative for congestion, dental problem, hearing loss, rhinorrhea, sinus pressure, sore throat and tinnitus.   Eyes: Negative for pain, discharge and visual disturbance.  Respiratory: Negative  for cough and shortness of breath.   Cardiovascular: Positive for chest pain and palpitations. Negative for leg swelling.  Gastrointestinal: Negative for nausea, vomiting, abdominal pain, diarrhea, constipation, blood in stool and abdominal distention.  Genitourinary: Negative for dysuria, urgency, frequency, hematuria, flank pain, vaginal bleeding, vaginal discharge, difficulty urinating, vaginal pain and pelvic pain.  Musculoskeletal: Positive for gait problem. Negative for joint swelling and arthralgias.  Skin: Negative for rash.  Neurological: Negative for dizziness, syncope, speech difficulty, weakness, numbness and headaches.  Hematological: Negative for adenopathy.  Psychiatric/Behavioral: Negative for behavioral problems, dysphoric  mood and agitation. The patient is not nervous/anxious.        Objective:   Physical Exam  Constitutional: She is oriented to person, place, and time. She appears well-developed and well-nourished.  Blood pressure well controlled Frail Uses a cane Requires 1 person assistance to transfer from a sitting position to the examining table  HENT:  Head: Normocephalic.  Right Ear: External ear normal.  Left Ear: External ear normal.  Mouth/Throat: Oropharynx is clear and moist.  Eyes: Conjunctivae and EOM are normal. Pupils are equal, round, and reactive to light.  Neck: Normal range of motion. Neck supple. No thyromegaly present.  Cardiovascular: Normal rate, regular rhythm, normal heart sounds and intact distal pulses.   Pulmonary/Chest: Effort normal and breath sounds normal. No respiratory distress. She has no wheezes. She has no rales.  Abdominal: Soft. Bowel sounds are normal. She exhibits no mass. There is no tenderness.  Musculoskeletal: Normal range of motion.  Lymphadenopathy:    She has no cervical adenopathy.  Neurological: She is alert and oriented to person, place, and time.  Skin: Skin is warm and dry. No rash noted.  Psychiatric: She has a normal mood and affect. Her behavior is normal.          Assessment & Plan:   Paroxysmal atrial fibrillation.  Continue present treatment.  Cardiology follow-up as scheduled Hypertension, stable Chest wall pain  Recheck 6 months or as needed

## 2015-04-22 ENCOUNTER — Encounter: Payer: Self-pay | Admitting: Internal Medicine

## 2015-06-11 NOTE — Progress Notes (Signed)
Patient ID: [name] Small, female   DOB: 08-08-1928, 82 y.o.   MRN: [id number]     CARDIOLOGY OFFICE NOTE  Date:  06/14/2015    [name] Small Date of Birth: Oct 05, 1928 Medical Record [id number]  PCP:  Nyoka Cowden, MD  Cardiologist:  Johnsie Cancel    Chief Complaint  Patient presents with  . Hypertension  . Atrial Fibrillation    History of Present Illness: [name] Small is a 82 y.o. female who presents today for a follow up visit.  She has a history of PAF, HTN, HLD, and DM. She had a hip fracture and was on coumadin back in February 2016 following left intratrochanteric fracture. She had missed steps in her garage.   Admitted in July 2016  with AF. She was given IV Lopressor with improvement in her heart rate. Her troponins were elevated. Cardiology was consulted. However after the Lopressor, patient did have bradycardia with heart rate in 40s, cardiology, Dr. Debara Pickett recommended to discontinue metoprolol and placed on Coreg. Her 2-D echocardiogram showed EF of 55-60%, normal wall motion. - CHADS-VASC 4-5, patient was started on Eliquis 2.5mg  BID. Aspirin was discontinued. Cardiology recommended 48 hours monitor to rule out sick sinus syndrome.   Phone call last week to report some sores in her mouth along with bleeding - was switched to Xarelto 15 mg     Seen by Dr Curt Bears 03/22/15  Monitor with early morning bradycardia asymptomatic  He did not feel pacer needed at this time   Past Medical History  Diagnosis Date  . DIVERTICULOSIS, COLON 11/22/2006  . HYPERLIPIDEMIA 11/22/2006  . OSTEOPOROSIS 11/22/2006  . Menopausal syndrome (hot flashes)   . Headache(784.0)   . Cancer ([place]) Meloanoma left leg  . Hypertension     Past Surgical History  Procedure Laterality Date  . Appendectomy    . Cholecystectomy    . Bunionectomy    . Femur im nail Left 07/08/2014    Procedure: INTRAMEDULLARY (IM) NAIL ;  Surgeon: Meredith Pel, MD;  Location: [facility];  Service: Orthopedics;  Laterality:  Left;     Medications: Current Outpatient Prescriptions  Medication Sig Dispense Refill  . acetaminophen (TYLENOL) 500 MG tablet Take 500 mg by mouth every 6 (six) hours as needed for mild pain or headache.     [name] atorvastatin (LIPITOR) 40 MG tablet Take 1 tablet (40 mg total) by mouth daily. 90 tablet 3  . Calcium Carbonate-Vitamin D (CALTRATE 600+D PO) Take 1 tablet by mouth daily.      [name] dronedarone (MULTAQ) 400 MG tablet Take 1 tablet (400 mg total) by mouth 2 (two) times daily with a meal. 60 tablet 3  . losartan (COZAAR) 100 MG tablet Take 1 tablet (100 mg total) by mouth daily. 90 tablet 3  . Polyvinyl Alcohol-Povidone (REFRESH OP) Place 1 drop into both eyes 2 (two) times daily.    . Rivaroxaban (XARELTO) 15 MG TABS tablet Take 1 tablet (15 mg total) by mouth daily with supper. 30 tablet 11   No current facility-administered medications for this visit.    Allergies: Allergies  Allergen Reactions  . Lisinopril Other (See Comments)    Cough    Social History: The patient  reports that she has never smoked. She has never used smokeless tobacco. She reports that she does not drink alcohol or use illicit drugs.   Family History: The patient's family history includes Alzheimer's disease in her sister; Cardiomyopathy in her brother.   Review of Systems:  Please see the history of present illness.   Otherwise, the review of systems is positive for none.   All other systems are reviewed and negative.   Physical Exam: VS:  BP 126/50 mmHg  Pulse 51  Ht 5\' 5"  (1.651 m)  Wt 57.425 kg (126 lb 9.6 oz)  BMI 21.07 kg/m2  SpO2 96% .  BMI Body mass index is 21.07 kg/(m^2).  Wt Readings from Last 3 Encounters:  06/14/15 57.425 kg (126 lb 9.6 oz)  04/21/15 57.38 kg (126 lb 8 oz)  03/23/15 57.244 kg (126 lb 3.2 oz)    General: Pleasant. Well developed, well nourished and in no acute distress.  HEENT: Normal. Neck: Supple, no JVD, carotid bruits, or masses noted.  Cardiac: Regular  rate and rhythm. No murmurs, rubs, or gallops. No edema.  Respiratory:  Lungs are clear to auscultation bilaterally with normal work of breathing.  GI: Soft and nontender.  MS: No deformity or atrophy. Gait and ROM intact. Skin: Warm and dry. Color is normal.  Neuro:  Strength and sensation are intact and no gross focal deficits noted.  Psych: Alert, appropriate and with normal affect.   LABORATORY DATA:  EKG:   03/23/15   sinus brady with a rate of 54. Has RSR'.   Lab Results  Component Value Date   WBC 9.1 01/27/2015   HGB 14.8 01/27/2015   HCT 46.4* 01/27/2015   PLT 285 01/27/2015   GLUCOSE 182* 01/27/2015   CHOL 277* 04/07/2014   TRIG 114.0 04/07/2014   HDL 62.00 04/07/2014   LDLDIRECT 176.0 02/24/2008   LDLCALC 192* 04/07/2014   ALT 12* 03/09/2015   AST 15 03/09/2015   NA 142 01/27/2015   K 3.7 01/27/2015   CL 106 01/27/2015   CREATININE 1.22* 01/27/2015   BUN 12 01/27/2015   CO2 25 01/27/2015   TSH 1.019 12/07/2014   INR 2.4 08/10/2014   HGBA1C 6.1* 12/07/2014    BNP (last 3 results) No results for input(s): BNP in the last 8760 hours.  ProBNP (last 3 results) No results for input(s): PROBNP in the last 8760 hours.   Other Studies Reviewed Today: Notes Recorded by Josue Hector, MD on 12/15/2014 at 2:34 PM Monitor ok no significant arrhythmias just PAC;s and PVC;s     Echo Study Conclusions from July 2017  - Left ventricle: The cavity size was normal. Wall thickness was normal. Systolic function was normal. The estimated ejection fraction was in the range of 55% to 60%. Wall motion was normal; there were no regional wall motion abnormalities. Doppler parameters are consistent with high ventricular filling pressure. - Aortic valve: There was trivial regurgitation. - Left atrium: The atrium was mildly dilated.  Impressions:  - Normal LV function; elevated LV filling pressure; mild LAE; trace AI, MR and TR.   Assessment/Plan: 1. PAF -   Remains in sinus.  Continue multaq  But decrease to once/day patient request due to expense.  Samples given  beta blocker d/c due to bradycardia  2. Chronic anticoagulation with Xarelto - she did not tolerate Eliquis. Low dose given age and fall 15 mg  3. HTN - BP is good on her current regimen.   4. HLD - on statin  5. Bradycardia - recent monitor ok - no profound bradycardia noted. Rate ok by EKG today. No pacer per [place] f/u EP  Current medicines are reviewed with the patient today.  The patient does not have concerns regarding medicines other than what has been  noted above.  The following changes have been made:  See above.  Labs/ tests ordered today include:    No orders of the defined types were placed in this encounter.     Disposition:   FU with EP 6 months and me a year   Patient is agreeable to this plan and will call if any problems develop in the interim.   Jenkins Rouge

## 2015-06-14 ENCOUNTER — Encounter: Payer: Self-pay | Admitting: Cardiovascular Disease

## 2015-06-14 ENCOUNTER — Ambulatory Visit (INDEPENDENT_AMBULATORY_CARE_PROVIDER_SITE_OTHER): Payer: Medicare Other | Admitting: Cardiovascular Disease

## 2015-06-14 VITALS — BP 126/50 | HR 51 | Ht 65.0 in | Wt 126.6 lb

## 2015-06-14 DIAGNOSIS — I10 Essential (primary) hypertension: Secondary | ICD-10-CM | POA: Diagnosis not present

## 2015-06-14 DIAGNOSIS — I480 Paroxysmal atrial fibrillation: Secondary | ICD-10-CM

## 2015-06-14 MED ORDER — DRONEDARONE HCL 400 MG PO TABS: 400.0000 mg | ORAL_TABLET | Freq: Every day | ORAL | Status: DC

## 2015-06-14 NOTE — Patient Instructions (Addendum)
Medication Instructions:  Your physician has recommended you make the following change in your medication:  1- Decrease Multaq 400 mg by mouth daily  Labwork: NONE  Testing/Procedures: NONE  Follow-Up: Your physician wants you to follow-up in: 6 months with Dr. Allene Pyo. You will receive a reminder letter in the mail two months in advance. If you don't receive a letter, please call our office to schedule the follow-up appointment.  Your physician wants you to follow-up in: 12 months with Dr. Johnsie Cancel. You will receive a reminder letter in the mail two months in advance. If you don't receive a letter, please call our office to schedule the follow-up appointment.  If you need a refill on your cardiac medications before your next appointment, please call your pharmacy.

## 2015-06-30 ENCOUNTER — Other Ambulatory Visit: Payer: Self-pay | Admitting: Internal Medicine

## 2015-07-20 ENCOUNTER — Other Ambulatory Visit: Payer: Self-pay | Admitting: Internal Medicine

## 2015-07-27 ENCOUNTER — Telehealth: Payer: Self-pay | Admitting: Internal Medicine

## 2015-07-27 NOTE — Telephone Encounter (Signed)
Noted  

## 2015-07-27 NOTE — Telephone Encounter (Signed)
Patient Name: [name] Small DOB: 03-Sep-1928 Initial Comment Caller states she has cold and cough, weakness, blood pressure dropped to top number 91 Nurse Assessment Nurse: Ronnald Ramp, RN, Miranda Date/Time (Eastern Time): 07/27/2015 1:39:10 PM Confirm and document reason for call. If symptomatic, describe symptoms. You must click the next button to save text entered. ---Caller states she has has a cold and cough for 2 days. This morning her SBP was 91 (normally 140's), it went up to 140/80. Has the patient traveled out of the country within the last 30 days? ---No Does the patient have any new or worsening symptoms? ---Yes Will a triage be completed? ---Yes Related visit to physician within the last 2 weeks? ---No Does the PT have any chronic conditions? (i.e. diabetes, asthma, etc.) ---Yes List chronic conditions. ---HTN, High Cholesterol, A-fib Is this a behavioral health or substance abuse call? ---No Guidelines Guideline Title Affirmed Question Affirmed Notes Common Cold Cold with no complications (all triage questions negative) Urination Pain - Female Age > 26 years Final Disposition Privateer, RN, Miranda Comments She also reports frequent urination since yesterday. Appt scheduled for tomorrow 07/28/15 at 10:15am with Dr. Burnice Logan Referrals REFERRED TO PCP OFFICE Disagree/Comply: Comply Disagree/Comply: Comply

## 2015-07-28 ENCOUNTER — Ambulatory Visit (INDEPENDENT_AMBULATORY_CARE_PROVIDER_SITE_OTHER): Payer: Medicare Other | Admitting: Internal Medicine

## 2015-07-28 ENCOUNTER — Encounter: Payer: Self-pay | Admitting: Internal Medicine

## 2015-07-28 VITALS — BP 126/70 | HR 63 | Temp 98.4°F | Resp 18 | Ht 65.0 in | Wt 126.0 lb

## 2015-07-28 DIAGNOSIS — J069 Acute upper respiratory infection, unspecified: Secondary | ICD-10-CM

## 2015-07-28 DIAGNOSIS — E785 Hyperlipidemia, unspecified: Secondary | ICD-10-CM

## 2015-07-28 DIAGNOSIS — R300 Dysuria: Secondary | ICD-10-CM

## 2015-07-28 DIAGNOSIS — I10 Essential (primary) hypertension: Secondary | ICD-10-CM

## 2015-07-28 DIAGNOSIS — R350 Frequency of micturition: Secondary | ICD-10-CM | POA: Diagnosis not present

## 2015-07-28 DIAGNOSIS — B9789 Other viral agents as the cause of diseases classified elsewhere: Secondary | ICD-10-CM

## 2015-07-28 LAB — POC URINALSYSI DIPSTICK (AUTOMATED)
Blood, UA: NEGATIVE
Glucose, UA: NEGATIVE
KETONES UA: NEGATIVE
NITRITE UA: NEGATIVE
PH UA: 5.5
Spec Grav, UA: >=1.03
UROBILINOGEN UA: 0.2

## 2015-07-28 NOTE — Patient Instructions (Signed)
Drink as much fluid as you  can tolerate over the next few days  Acute bronchitis symptoms for less than 10 days are generally not helped by antibiotics.  Take over-the-counter expectorants and cough medications such as  Mucinex DM.  Call if there is no improvement in 5 to 7 days or if  you develop worsening cough, fever, or new symptoms, such as shortness of breath or chest pain.  Call or return to clinic prn if these symptoms worsen or fail to improve as anticipated.  Take [phone] mg of Tylenol every 6 hours as needed for pain relief or fever.  Avoid taking more than 3000 mg in a 24-hour period (  This may cause liver damage).

## 2015-07-28 NOTE — Progress Notes (Signed)
Pre visit review using our clinic review tool, if applicable. No additional management support is needed unless otherwise documented below in the visit note. 

## 2015-07-28 NOTE — Progress Notes (Signed)
Subjective:    Patient ID: [name] Small, female    DOB: 12/07/1928, 82 y.o.   MRN: [id number]  HPI  82 year old patient who has a history of hypertension, dyslipidemia and paroxysmal atrial fibrillation.  For the past 2 or 3 days she has had a general sense of unwellness.  She has had cough, chest congestion, weakness and anorexia.  She also noticed some occasional urinary frequency.  There is been no fever, chills or productive cough  Past Medical History  Diagnosis Date  . DIVERTICULOSIS, COLON 11/22/2006  . HYPERLIPIDEMIA 11/22/2006  . OSTEOPOROSIS 11/22/2006  . Menopausal syndrome (hot flashes)   . Headache(784.0)   . Cancer ([place]) Meloanoma left leg  . Hypertension     Social History   Social History  . Marital Status: Widowed    Spouse Name: N/A  . Number of Children: N/A  . Years of Education: N/A   Occupational History  . Not on file.   Social History Main Topics  . Smoking status: Never Smoker   . Smokeless tobacco: Never Used  . Alcohol Use: No  . Drug Use: No  . Sexual Activity: No   Other Topics Concern  . Not on file   Social History Narrative    Past Surgical History  Procedure Laterality Date  . Appendectomy    . Cholecystectomy    . Bunionectomy    . Femur im nail Left 07/08/2014    Procedure: INTRAMEDULLARY (IM) NAIL ;  Surgeon: Meredith Pel, MD;  Location: [facility];  Service: Orthopedics;  Laterality: Left;    Family History  Problem Relation Age of Onset  . Alzheimer's disease Sister   . Cardiomyopathy Brother     Allergies  Allergen Reactions  . Lisinopril Other (See Comments)    Cough    Current Outpatient Prescriptions on File Prior to Visit  Medication Sig Dispense Refill  . acetaminophen (TYLENOL) 500 MG tablet Take 500 mg by mouth every 6 (six) hours as needed for mild pain or headache.     [name] atorvastatin (LIPITOR) 40 MG tablet TAKE 1 TABLET BY MOUTH DAILY 90 tablet 1  . Calcium Carbonate-Vitamin D (CALTRATE 600+D PO) Take 1  tablet by mouth daily.      [name] dronedarone (MULTAQ) 400 MG tablet Take 1 tablet (400 mg total) by mouth daily.    [name] losartan (COZAAR) 100 MG tablet TAKE 1 TABLET BY MOUTH DAILY 90 tablet 1  . Polyvinyl Alcohol-Povidone (REFRESH OP) Place 1 drop into both eyes 2 (two) times daily.    . Rivaroxaban (XARELTO) 15 MG TABS tablet Take 1 tablet (15 mg total) by mouth daily with supper. 30 tablet 11   No current facility-administered medications on file prior to visit.    BP 126/70 mmHg  Pulse 63  Temp(Src) 98.4 F (36.9 C) (Oral)  Resp 18  Ht 5\' 5"  (1.651 m)  Wt 126 lb (57.153 kg)  BMI 20.97 kg/m2  SpO2 98%     Review of Systems  Constitutional: Positive for activity change, appetite change and fatigue. Negative for fever, chills and diaphoresis.  HENT: Positive for congestion. Negative for dental problem, hearing loss, rhinorrhea, sinus pressure, sore throat and tinnitus.   Eyes: Negative for pain, discharge and visual disturbance.  Respiratory: Positive for cough. Negative for shortness of breath.   Cardiovascular: Negative for chest pain, palpitations and leg swelling.  Gastrointestinal: Negative for nausea, vomiting, abdominal pain, diarrhea, constipation, blood in stool and abdominal distention.  Genitourinary: Negative  for dysuria, urgency, frequency, hematuria, flank pain, vaginal bleeding, vaginal discharge, difficulty urinating, vaginal pain and pelvic pain.  Musculoskeletal: Negative for joint swelling, arthralgias and gait problem.  Skin: Negative for rash.  Neurological: Positive for weakness. Negative for dizziness, syncope, speech difficulty, numbness and headaches.  Hematological: Negative for adenopathy.  Psychiatric/Behavioral: Negative for behavioral problems, dysphoric mood and agitation. The patient is not nervous/anxious.        Objective:   Physical Exam  Constitutional: She is oriented to person, place, and time. She appears well-developed and well-nourished.    Afebrile Appears unwell, but in no acute distress Pulse 63  HENT:  Head: Normocephalic.  Right Ear: External ear normal.  Left Ear: External ear normal.  Mouth/Throat: Oropharynx is clear and moist.  Eyes: Conjunctivae and EOM are normal. Pupils are equal, round, and reactive to light.  Neck: Normal range of motion. Neck supple. No thyromegaly present.  Cardiovascular: Normal rate, regular rhythm, normal heart sounds and intact distal pulses.   Pulmonary/Chest: Effort normal and breath sounds normal.  O2 saturation 98  Abdominal: Soft. Bowel sounds are normal. She exhibits no mass. There is no tenderness.  Musculoskeletal: Normal range of motion.  Lymphadenopathy:    She has no cervical adenopathy.  Neurological: She is alert and oriented to person, place, and time.  Skin: Skin is warm and dry. No rash noted.  Psychiatric: She has a normal mood and affect. Her behavior is normal.          Assessment & Plan:   Viral URI with cough.  Will encourage liberal fluid intake and treat symptomatically Hypertension, stable

## 2015-09-28 ENCOUNTER — Encounter (HOSPITAL_COMMUNITY): Payer: Self-pay | Admitting: Nurse Practitioner

## 2015-09-28 ENCOUNTER — Ambulatory Visit (HOSPITAL_COMMUNITY)
Admission: RE | Admit: 2015-09-28 | Discharge: 2015-09-28 | Disposition: A | Discharge status: Home / Self Care | Payer: Medicare Other | Source: Ambulatory Visit | Attending: Nurse Practitioner | Admitting: Nurse Practitioner

## 2015-09-28 VITALS — BP 130/78 | HR 57 | Ht 65.0 in | Wt 127.4 lb

## 2015-09-28 DIAGNOSIS — Z0189 Encounter for other specified special examinations: Secondary | ICD-10-CM | POA: Diagnosis not present

## 2015-09-28 DIAGNOSIS — I444 Left anterior fascicular block: Secondary | ICD-10-CM | POA: Diagnosis not present

## 2015-09-28 DIAGNOSIS — I480 Paroxysmal atrial fibrillation: Secondary | ICD-10-CM | POA: Insufficient documentation

## 2015-09-28 DIAGNOSIS — K5730 Diverticulosis of large intestine without perforation or abscess without bleeding: Secondary | ICD-10-CM | POA: Diagnosis not present

## 2015-09-28 DIAGNOSIS — M810 Age-related osteoporosis without current pathological fracture: Secondary | ICD-10-CM | POA: Diagnosis not present

## 2015-09-28 DIAGNOSIS — I10 Essential (primary) hypertension: Secondary | ICD-10-CM | POA: Insufficient documentation

## 2015-09-28 DIAGNOSIS — C4372 Malignant melanoma of left lower limb, including hip: Secondary | ICD-10-CM | POA: Insufficient documentation

## 2015-09-28 DIAGNOSIS — Z7901 Long term (current) use of anticoagulants: Secondary | ICD-10-CM | POA: Insufficient documentation

## 2015-09-28 DIAGNOSIS — E785 Hyperlipidemia, unspecified: Secondary | ICD-10-CM | POA: Diagnosis not present

## 2015-09-28 DIAGNOSIS — R001 Bradycardia, unspecified: Secondary | ICD-10-CM | POA: Diagnosis not present

## 2015-09-28 DIAGNOSIS — N951 Menopausal and female climacteric states: Secondary | ICD-10-CM | POA: Diagnosis not present

## 2015-09-28 NOTE — Progress Notes (Signed)
Patient ID: [name] Small, female   DOB: 01-Jul-1928, 82 y.o.   MRN: [id number]   Primary Care Physician: Nyoka Cowden, MD Referring Physician: Truitt Merle, NP  Cardiologist: Dr. Johnsie Cancel EP: Dr. Oris Drone is a 82 y.o. female with h/o PAF that was first diagnosed 7/18 with PAF. Has has a past history of a fall in 2/16 with subsequent hip fracture and had been on coumadin for a short period of time.  She was d/c on eliquis but did not tolerate and is now on 15 mg of xarelto. She does have some issues with bradycardia when is SR. She was d/c on low dose carvedilol. She then had a ER visit 9/7 for another episode of afib with rvr at 160 bpm which left her feeling weak and sweaty.   She was started on Multaq and is currently tolerating without any further afib. Carvedilol was stopped with the addition of multaq due to concerns re bradycardia. She also wore a holter monitor which showed some concerns for brady with pauses and pt will be sent to EP for further evaluation. She, however is asymptomatic re spells of dizziness, presyncope.   Returns 5/8. At some point multaq was decreased to one a day for insomnia but is keeping her in rhythm. She was seen by Dr. Curt Bears 11/1, and was evaluated for brady on monitor but was not felt to be in need of PPM. She is not symptomatic today. HR is 57 bpm and tolerating.  Today, she denies symptoms of palpitations, chest pain, shortness of breath, orthopnea, PND, lower extremity edema, dizziness, presyncope, syncope, or neurologic sequela. The patient is tolerating medications without difficulties and is otherwise without complaint today.   Past Medical History  Diagnosis Date  . DIVERTICULOSIS, COLON 11/22/2006  . HYPERLIPIDEMIA 11/22/2006  . OSTEOPOROSIS 11/22/2006  . Menopausal syndrome (hot flashes)   . Headache(784.0)   . Cancer ([place]) Meloanoma left leg  . Hypertension    Past Surgical History  Procedure Laterality Date  . Appendectomy     . Cholecystectomy    . Bunionectomy    . Femur im nail Left 07/08/2014    Procedure: INTRAMEDULLARY (IM) NAIL ;  Surgeon: Meredith Pel, MD;  Location: [facility];  Service: Orthopedics;  Laterality: Left;    Current Outpatient Prescriptions  Medication Sig Dispense Refill  . acetaminophen (TYLENOL) 500 MG tablet Take 500 mg by mouth every 6 (six) hours as needed for mild pain or headache.     [name] atorvastatin (LIPITOR) 40 MG tablet TAKE 1 TABLET BY MOUTH DAILY 90 tablet 1  . Calcium Carbonate-Vitamin D (CALTRATE 600+D PO) Take 1 tablet by mouth daily.      [name] dronedarone (MULTAQ) 400 MG tablet Take 1 tablet (400 mg total) by mouth daily.    [name] losartan (COZAAR) 100 MG tablet TAKE 1 TABLET BY MOUTH DAILY 90 tablet 1  . Polyvinyl Alcohol-Povidone (REFRESH OP) Place 1 drop into both eyes 2 (two) times daily.    . Rivaroxaban (XARELTO) 15 MG TABS tablet Take 1 tablet (15 mg total) by mouth daily with supper. 30 tablet 11   No current facility-administered medications for this encounter.    Allergies  Allergen Reactions  . Lisinopril Other (See Comments)    Cough    Social History   Social History  . Marital Status: Widowed    Spouse Name: N/A  . Number of Children: N/A  . Years of Education: N/A   Occupational History  .  Not on file.   Social History Main Topics  . Smoking status: Never Smoker   . Smokeless tobacco: Never Used  . Alcohol Use: No  . Drug Use: No  . Sexual Activity: No   Other Topics Concern  . Not on file   Social History Narrative    Family History  Problem Relation Age of Onset  . Alzheimer's disease Sister   . Cardiomyopathy Brother     ROS- All systems are reviewed and negative except as per the HPI above  Physical Exam: Filed Vitals:   09/28/15 1037  BP: 130/78  Pulse: 57  Height: 5\' 5"  (1.651 m)  Weight: 127 lb 6.4 oz (57.788 kg)    GEN- The patient is well appearing, alert and oriented x 3 today.   Head- normocephalic,  atraumatic Eyes-  Sclera clear, conjunctiva pink Ears- hearing intact Oropharynx- clear Neck- supple, no JVP Lymph- no cervical lymphadenopathy Lungs- Clear to ausculation bilaterally, normal work of breathing Heart- Regular rate and rhythm, no murmurs, rubs or gallops, PMI not laterally displaced GI- soft, NT, ND, + BS Extremities- no clubbing, cyanosis, or edema MS- no significant deformity or atrophy Skin- no rash or lesion Psych- euthymic mood, full affect Neuro- strength and sensation are intact  EKG-Sinus brady at 57 bpm, pr int 152 ms, QRS int 82 ms, QTc 414 ms, LAFB  Holter monitor: 03/02/15- Interpreted by Dr. Johnsie Cancel, Sinus rhythm sinus bradycardia PVC;s with slow bigeminny. If PVC;s not perfused effective HR as low as 24 bpm PAC;s and short bursts of atrial tachycardia less than 10 beats Average HR 54 bpm  Will refer to EP   Assessment and Plan: 1. PAF Continue on multaq 400 qd, no further afib Continue on xarelto  2. Bradycardia Asymptomatic  Dr. Curt Bears as scheduled 7/26 F/u in the afib clinic as needed  [place]. [name], [facility] [address] [place], [place] [address] [phone]

## 2015-09-30 DIAGNOSIS — H35033 Hypertensive retinopathy, bilateral: Secondary | ICD-10-CM | POA: Diagnosis not present

## 2015-09-30 DIAGNOSIS — H04123 Dry eye syndrome of bilateral lacrimal glands: Secondary | ICD-10-CM | POA: Diagnosis not present

## 2015-09-30 DIAGNOSIS — H01024 Squamous blepharitis left upper eyelid: Secondary | ICD-10-CM | POA: Diagnosis not present

## 2015-09-30 DIAGNOSIS — H26492 Other secondary cataract, left eye: Secondary | ICD-10-CM | POA: Diagnosis not present

## 2015-10-06 ENCOUNTER — Telehealth: Payer: Self-pay | Admitting: Internal Medicine

## 2015-10-06 NOTE — Telephone Encounter (Signed)
Patient wants to refill her Amoxicillin- 500 mg four times daily for itching.  Patient does not have an appointment.    Pharmacy- [facility] on [address].

## 2015-10-19 ENCOUNTER — Ambulatory Visit: Payer: Medicare Other | Admitting: Internal Medicine

## 2015-10-26 ENCOUNTER — Ambulatory Visit (INDEPENDENT_AMBULATORY_CARE_PROVIDER_SITE_OTHER): Payer: Medicare Other | Admitting: Internal Medicine

## 2015-10-26 ENCOUNTER — Encounter: Payer: Self-pay | Admitting: Internal Medicine

## 2015-10-26 VITALS — BP 120/70 | HR 56 | Temp 98.1°F | Resp 18 | Ht 65.0 in | Wt 128.0 lb

## 2015-10-26 DIAGNOSIS — E785 Hyperlipidemia, unspecified: Secondary | ICD-10-CM

## 2015-10-26 DIAGNOSIS — I10 Essential (primary) hypertension: Secondary | ICD-10-CM

## 2015-10-26 DIAGNOSIS — I4891 Unspecified atrial fibrillation: Secondary | ICD-10-CM | POA: Diagnosis not present

## 2015-10-26 NOTE — Progress Notes (Signed)
Subjective:    Patient ID: [name] Small, female    DOB: Jul 19, 1928, 82 y.o.   MRN: [id number]  HPI  82 year old patient who is seen today for her six-month follow-up.  She was hospitalized about 11 months ago for new onset atrial fibrillation.  Remains on chronic anticoagulation.  She has treated hypertension and dyslipidemia.  No new concerns or complaints. She is followed by cardiology.  Past Medical History  Diagnosis Date  . DIVERTICULOSIS, COLON 11/22/2006  . HYPERLIPIDEMIA 11/22/2006  . OSTEOPOROSIS 11/22/2006  . Menopausal syndrome (hot flashes)   . Headache(784.0)   . Cancer ([place]) Meloanoma left leg  . Hypertension      Social History   Social History  . Marital Status: Widowed    Spouse Name: N/A  . Number of Children: N/A  . Years of Education: N/A   Occupational History  . Not on file.   Social History Main Topics  . Smoking status: Never Smoker   . Smokeless tobacco: Never Used  . Alcohol Use: No  . Drug Use: No  . Sexual Activity: No   Other Topics Concern  . Not on file   Social History Narrative    Past Surgical History  Procedure Laterality Date  . Appendectomy    . Cholecystectomy    . Bunionectomy    . Femur im nail Left 07/08/2014    Procedure: INTRAMEDULLARY (IM) NAIL ;  Surgeon: Meredith Pel, MD;  Location: [facility];  Service: Orthopedics;  Laterality: Left;    Family History  Problem Relation Age of Onset  . Alzheimer's disease Sister   . Cardiomyopathy Brother     Allergies  Allergen Reactions  . Lisinopril Other (See Comments)    Cough    Current Outpatient Prescriptions on File Prior to Visit  Medication Sig Dispense Refill  . acetaminophen (TYLENOL) 500 MG tablet Take 500 mg by mouth every 6 (six) hours as needed for mild pain or headache.     [name] atorvastatin (LIPITOR) 40 MG tablet TAKE 1 TABLET BY MOUTH DAILY 90 tablet 1  . Calcium Carbonate-Vitamin D (CALTRATE 600+D PO) Take 1 tablet by mouth daily.      [name] dronedarone  (MULTAQ) 400 MG tablet Take 1 tablet (400 mg total) by mouth daily.    [name] losartan (COZAAR) 100 MG tablet TAKE 1 TABLET BY MOUTH DAILY 90 tablet 1  . Polyvinyl Alcohol-Povidone (REFRESH OP) Place 1 drop into both eyes 2 (two) times daily.    . Rivaroxaban (XARELTO) 15 MG TABS tablet Take 1 tablet (15 mg total) by mouth daily with supper. 30 tablet 11   No current facility-administered medications on file prior to visit.    BP 120/70 mmHg  Pulse 56  Temp(Src) 98.1 F (36.7 C) (Oral)  Resp 18  Ht 5\' 5"  (1.651 m)  Wt 128 lb (58.06 kg)  BMI 21.30 kg/m2  SpO2 99%     Review of Systems  Constitutional: Negative.   HENT: Negative for congestion, dental problem, hearing loss, rhinorrhea, sinus pressure, sore throat and tinnitus.   Eyes: Negative for pain, discharge and visual disturbance.  Respiratory: Negative for cough and shortness of breath.   Cardiovascular: Negative for chest pain, palpitations and leg swelling.  Gastrointestinal: Negative for nausea, vomiting, abdominal pain, diarrhea, constipation, blood in stool and abdominal distention.  Genitourinary: Negative for dysuria, urgency, frequency, hematuria, flank pain, vaginal bleeding, vaginal discharge, difficulty urinating, vaginal pain and pelvic pain.  Musculoskeletal: Negative for joint swelling, arthralgias  and gait problem.  Skin: Negative for rash.  Neurological: Negative for dizziness, syncope, speech difficulty, weakness, numbness and headaches.  Hematological: Negative for adenopathy.  Psychiatric/Behavioral: Negative for behavioral problems, dysphoric mood and agitation. The patient is not nervous/anxious.        Objective:   Physical Exam  Constitutional: She is oriented to person, place, and time. She appears well-developed and well-nourished.  HENT:  Head: Normocephalic.  Right Ear: External ear normal.  Left Ear: External ear normal.  Mouth/Throat: Oropharynx is clear and moist.  Eyes: Conjunctivae and EOM  are normal. Pupils are equal, round, and reactive to light.  Neck: Normal range of motion. Neck supple. No thyromegaly present.  Cardiovascular: Normal rate, regular rhythm, normal heart sounds and intact distal pulses.   Pulmonary/Chest: Effort normal and breath sounds normal.  Abdominal: Soft. Bowel sounds are normal. She exhibits no mass. There is no tenderness.  Musculoskeletal: Normal range of motion.  Lymphadenopathy:    She has no cervical adenopathy.  Neurological: She is alert and oriented to person, place, and time.  Skin: Skin is warm and dry. No rash noted.  Psychiatric: She has a normal mood and affect. Her behavior is normal.          Assessment & Plan:   Atrial fibrillation, stable Essential hypertension, well-controlled History of stress hyperglycemia Dyslipidemia.  Continue statin therapy  CPX 6 months   Nyoka Cowden, MD

## 2015-10-26 NOTE — Progress Notes (Signed)
Pre visit review using our clinic review tool, if applicable. No additional management support is needed unless otherwise documented below in the visit note. 

## 2015-10-26 NOTE — Progress Notes (Signed)
   Subjective:    Patient ID: [name] Small, female    DOB: Jul 27, 1928, 82 y.o.   MRN: [id number]  HPI  Lab Results  Component Value Date   HGBA1C 6.1* 12/07/2014    Review of Systems     Objective:   Physical Exam        Assessment & Plan:

## 2015-10-26 NOTE — Patient Instructions (Signed)
Limit your sodium (Salt) intake  Please check your blood pressure on a regular basis.  If it is consistently greater than 150/90, please make an office appointment.  Cardiology follow-up as scheduled  Return in 6 months for follow-up 

## 2015-11-02 DIAGNOSIS — Z1231 Encounter for screening mammogram for malignant neoplasm of breast: Secondary | ICD-10-CM | POA: Diagnosis not present

## 2015-11-02 LAB — HM MAMMOGRAPHY

## 2015-11-04 ENCOUNTER — Encounter: Payer: Self-pay | Admitting: Internal Medicine

## 2015-11-24 ENCOUNTER — Emergency Department (HOSPITAL_COMMUNITY)
Admission: EM | Admit: 2015-11-24 | Discharge: 2015-11-24 | Disposition: A | Discharge status: Home / Self Care | Payer: Medicare Other | Attending: Emergency Medicine | Admitting: Emergency Medicine

## 2015-11-24 ENCOUNTER — Emergency Department (HOSPITAL_COMMUNITY): Payer: Medicare Other

## 2015-11-24 ENCOUNTER — Encounter (HOSPITAL_COMMUNITY): Payer: Self-pay | Admitting: *Deleted

## 2015-11-24 DIAGNOSIS — R0602 Shortness of breath: Secondary | ICD-10-CM | POA: Diagnosis not present

## 2015-11-24 DIAGNOSIS — Z79899 Other long term (current) drug therapy: Secondary | ICD-10-CM | POA: Diagnosis not present

## 2015-11-24 DIAGNOSIS — Z7901 Long term (current) use of anticoagulants: Secondary | ICD-10-CM | POA: Insufficient documentation

## 2015-11-24 DIAGNOSIS — R531 Weakness: Secondary | ICD-10-CM | POA: Insufficient documentation

## 2015-11-24 DIAGNOSIS — I480 Paroxysmal atrial fibrillation: Secondary | ICD-10-CM | POA: Diagnosis not present

## 2015-11-24 DIAGNOSIS — N390 Urinary tract infection, site not specified: Secondary | ICD-10-CM

## 2015-11-24 DIAGNOSIS — I4891 Unspecified atrial fibrillation: Secondary | ICD-10-CM | POA: Diagnosis not present

## 2015-11-24 DIAGNOSIS — I10 Essential (primary) hypertension: Secondary | ICD-10-CM | POA: Diagnosis not present

## 2015-11-24 DIAGNOSIS — Z85820 Personal history of malignant melanoma of skin: Secondary | ICD-10-CM | POA: Diagnosis not present

## 2015-11-24 DIAGNOSIS — I499 Cardiac arrhythmia, unspecified: Secondary | ICD-10-CM | POA: Diagnosis present

## 2015-11-24 HISTORY — DX: Unspecified atrial fibrillation: I48.91

## 2015-11-24 LAB — BASIC METABOLIC PANEL
Anion gap: 8 (ref 5–15)
BUN: 13 mg/dL (ref 6–20)
CALCIUM: 9.4 mg/dL (ref 8.9–10.3)
CO2: 23 mmol/L (ref 22–32)
CREATININE: 1.18 mg/dL — AB (ref 0.44–1.00)
Chloride: 109 mmol/L (ref 101–111)
GFR calc non Af Amer: 41 mL/min — ABNORMAL LOW (ref >60)
GFR, EST AFRICAN AMERICAN: 48 mL/min — AB (ref >60)
GLUCOSE: 103 mg/dL — AB (ref 65–99)
Potassium: 4.2 mmol/L (ref 3.5–5.1)
Sodium: 140 mmol/L (ref 135–145)

## 2015-11-24 LAB — CBC
HCT: 43.8 % (ref 36.0–46.0)
Hemoglobin: 13.8 g/dL (ref 12.0–15.0)
MCH: 29.4 pg (ref 26.0–34.0)
MCHC: 31.5 g/dL (ref 30.0–36.0)
MCV: 93.4 fL (ref 78.0–100.0)
PLATELETS: 214 10*3/uL (ref 150–400)
RBC: 4.69 MIL/uL (ref 3.87–5.11)
RDW: 13.8 % (ref 11.5–15.5)
WBC: 6.2 10*3/uL (ref 4.0–10.5)

## 2015-11-24 LAB — URINALYSIS, ROUTINE W REFLEX MICROSCOPIC
BILIRUBIN URINE: NEGATIVE
Glucose, UA: NEGATIVE mg/dL
HGB URINE DIPSTICK: NEGATIVE
KETONES UR: NEGATIVE mg/dL
Nitrite: NEGATIVE
PROTEIN: NEGATIVE mg/dL
Specific Gravity, Urine: 1.017 (ref 1.005–1.030)
pH: 6.5 (ref 5.0–8.0)

## 2015-11-24 LAB — URINE MICROSCOPIC-ADD ON

## 2015-11-24 LAB — TROPONIN I
TROPONIN I: 0.06 ng/mL — AB (ref <0.03)
Troponin I: 0.05 ng/mL (ref <0.03)

## 2015-11-24 LAB — BRAIN NATRIURETIC PEPTIDE: B NATRIURETIC PEPTIDE 5: 519.9 pg/mL — AB (ref 0.0–100.0)

## 2015-11-24 MED ORDER — METOPROLOL TARTRATE 25 MG PO TABS: 12.5000 mg | ORAL_TABLET | Freq: Two times a day (BID) | ORAL | Status: DC

## 2015-11-24 MED ORDER — CEPHALEXIN 500 MG PO CAPS: 500.0000 mg | ORAL_CAPSULE | Freq: Three times a day (TID) | ORAL | Status: DC

## 2015-11-24 MED ORDER — METOPROLOL TARTRATE 25 MG PO TABS
12.5000 mg | ORAL_TABLET | Freq: Once | ORAL | Status: AC
  Administered 2015-11-24: 12.5 mg via ORAL
  Filled 2015-11-24: qty 1

## 2015-11-24 NOTE — Discharge Instructions (Signed)
Atrial Fibrillation Stop taking multaq. Take metoprolol twice daily instead. Follow up in the A fib clinic in 2 days. Return to the ED if you develop chest pain, shortness of breath, or any other concerns. Atrial fibrillation is a type of irregular or rapid heartbeat (arrhythmia). In atrial fibrillation, the heart quivers continuously in a chaotic pattern. This occurs when parts of the heart receive disorganized signals that make the heart unable to pump blood normally. This can increase the risk for stroke, heart failure, and other heart-related conditions. There are different types of atrial fibrillation, including:  Paroxysmal atrial fibrillation. This type starts suddenly, and it usually stops on its own shortly after it starts.  Persistent atrial fibrillation. This type often lasts longer than a week. It may stop on its own or with treatment.  Long-lasting persistent atrial fibrillation. This type lasts longer than 12 months.  Permanent atrial fibrillation. This type does not go away. Talk with your health care provider to learn about the type of atrial fibrillation that you have. CAUSES This condition is caused by some heart-related conditions or procedures, including:  A heart attack.  Coronary artery disease.  Heart failure.  Heart valve conditions.  High blood pressure.  Inflammation of the sac that surrounds the heart (pericarditis).  Heart surgery.  Certain heart rhythm disorders, such as Wolf-Parkinson-White syndrome. Other causes include:  Pneumonia.  Obstructive sleep apnea.  Blockage of an artery in the lungs (pulmonary embolism, or PE).  Lung cancer.  Chronic lung disease.  Thyroid problems, especially if the thyroid is overactive (hyperthyroidism).  Caffeine.  Excessive alcohol use or illegal drug use.  Use of some medicines, including certain decongestants and diet pills. Sometimes, the cause cannot be found. RISK FACTORS This condition is more  likely to develop in:  People who are older in age.  People who smoke.  People who have diabetes mellitus.  People who are overweight (obese).  Athletes who exercise vigorously. SYMPTOMS Symptoms of this condition include:  A feeling that your heart is beating rapidly or irregularly.  A feeling of discomfort or pain in your chest.  Shortness of breath.  Sudden light-headedness or weakness.  Getting tired easily during exercise. In some cases, there are no symptoms. DIAGNOSIS Your health care provider may be able to detect atrial fibrillation when taking your pulse. If detected, this condition may be diagnosed with:  An electrocardiogram (ECG).  A Holter monitor test that records your heartbeat patterns over a 24-hour period.  Transthoracic echocardiogram (TTE) to evaluate how blood flows through your heart.  Transesophageal echocardiogram (TEE) to view more detailed images of your heart.  A stress test.  Imaging tests, such as a CT scan or chest X-ray.  Blood tests. TREATMENT The main goals of treatment are to prevent blood clots from forming and to keep your heart beating at a normal rate and rhythm. The type of treatment that you receive depends on many factors, such as your underlying medical conditions and how you feel when you are experiencing atrial fibrillation. This condition may be treated with:  Medicine to slow down the heart rate, bring the heart's rhythm back to normal, or prevent clots from forming.  Electrical cardioversion. This is a procedure that resets your heart's rhythm by delivering a controlled, low-energy shock to the heart through your skin.  Different types of ablation, such as catheter ablation, catheter ablation with pacemaker, or surgical ablation. These procedures destroy the heart tissues that send abnormal signals. When the pacemaker is used,  it is placed under your skin to help your heart beat in a regular rhythm. HOME CARE  INSTRUCTIONS  Take over-the counter and prescription medicines only as told by your health care provider.  If your health care provider prescribed a blood-thinning medicine (anticoagulant), take it exactly as told. Taking too much blood-thinning medicine can cause bleeding. If you do not take enough blood-thinning medicine, you will not have the protection that you need against stroke and other problems.  Do not use tobacco products, including cigarettes, chewing tobacco, and e-cigarettes. If you need help quitting, ask your health care provider.  If you have obstructive sleep apnea, manage your condition as told by your health care provider.  Do not drink alcohol.  Do not drink beverages that contain caffeine, such as coffee, soda, and tea.  Maintain a healthy weight. Do not use diet pills unless your health care provider approves. Diet pills may make heart problems worse.  Follow diet instructions as told by your health care provider.  Exercise regularly as told by your health care provider.  Keep all follow-up visits as told by your health care provider. This is important. PREVENTION  Avoid drinking beverages that contain caffeine or alcohol.  Avoid certain medicines, especially medicines that are used for breathing problems.  Avoid certain herbs and herbal medicines, such as those that contain ephedra or ginseng.  Do not use illegal drugs, such as cocaine and amphetamines.  Do not smoke.  Manage your high blood pressure. SEEK MEDICAL CARE IF:  You notice a change in the rate, rhythm, or strength of your heartbeat.  You are taking an anticoagulant and you notice increased bruising.  You tire more easily when you exercise or exert yourself. SEEK IMMEDIATE MEDICAL CARE IF:  You have chest pain, abdominal pain, sweating, or weakness.  You feel nauseous.  You notice blood in your vomit, bowel movement, or urine.  You have shortness of breath.  You suddenly have  swollen feet and ankles.  You feel dizzy.  You have sudden weakness or numbness of the face, arm, or leg, especially on one side of the body.  You have trouble speaking, trouble understanding, or both (aphasia).  Your face or your eyelid droops on one side. These symptoms may represent a serious problem that is an emergency. Do not wait to see if the symptoms will go away. Get medical help right away. Call your local emergency services (911 in the [place] Do not drive yourself to the hospital.   This information is not intended to replace advice given to you by your health care provider. Make sure you discuss any questions you have with your health care provider.   Document Released: 05/08/2005 Document Revised: 01/27/2015 Document Reviewed: 09/02/2014 Elsevier Interactive Patient Education [employer].

## 2015-11-24 NOTE — ED Notes (Signed)
Pt used bedside commode. Gave urine sample

## 2015-11-24 NOTE — ED Notes (Signed)
MD at bedside. 

## 2015-11-24 NOTE — ED Notes (Signed)
Patient transported to X-ray 

## 2015-11-24 NOTE — Consult Note (Signed)
ELECTROPHYSIOLOGY CONSULT NOTE    Patient ID: [name] Small MRN: [id number], DOB/AGE: 1928-09-09 82 y.o.  Admit date: 11/24/2015 Date of Consult: 11/24/2015  Primary Physician: Nyoka Cowden, MD Primary Cardiologist: Johnsie Cancel Electrophysiologist: Curt Bears  Reason for Consultation: AF with RVR   HPI:  [name] Small is a 82 y.o. female with a past medical history significant for hypertension, hyperlipidemia, and paroxysmal atrial fibrillation.  Bradycardia has limited medication options for atrial fibrillation.  She went into atrial fibrillation yesterday morning and is symptomatic with fatigue, weakness, and diaphoresis.  She takes Xarelto and reports compliance except for 2 missed doses last week.    She currently reports fatigue and weakness.  She denies chest pain, shortness of breath, dizziness, syncope. No fevers or chills.   Past Medical History  Diagnosis Date  . DIVERTICULOSIS, COLON 11/22/2006  . HYPERLIPIDEMIA 11/22/2006  . OSTEOPOROSIS 11/22/2006  . Menopausal syndrome (hot flashes)   . Headache(784.0)   . Cancer ([place]) Meloanoma left leg  . Hypertension   . Atrial fibrillation [facility])      Surgical History:  Past Surgical History  Procedure Laterality Date  . Appendectomy    . Cholecystectomy    . Bunionectomy    . Femur im nail Left 07/08/2014    Procedure: INTRAMEDULLARY (IM) NAIL ;  Surgeon: Meredith Pel, MD;  Location: [facility];  Service: Orthopedics;  Laterality: Left;     Current outpatient prescriptions:  .  acetaminophen (TYLENOL) 500 MG tablet, Take 500 mg by mouth every 6 (six) hours as needed for mild pain or headache. , Disp: , Rfl:  .  atorvastatin (LIPITOR) 40 MG tablet, TAKE 1 TABLET BY MOUTH DAILY, Disp: 90 tablet, Rfl: 1 .  Calcium Carbonate-Vitamin D (CALTRATE 600+D PO), Take 1 tablet by mouth daily.  , Disp: , Rfl:  .  dronedarone (MULTAQ) 400 MG tablet, Take 1 tablet (400 mg total) by mouth daily., Disp: , Rfl:  .  losartan (COZAAR) 100 MG  tablet, TAKE 1 TABLET BY MOUTH DAILY, Disp: 90 tablet, Rfl: 1 .  Polyvinyl Alcohol-Povidone (REFRESH OP), Place 1 drop into both eyes 2 (two) times daily., Disp: , Rfl:  .  Rivaroxaban (XARELTO) 15 MG TABS tablet, Take 1 tablet (15 mg total) by mouth daily with supper., Disp: 30 tablet, Rfl: 11   Inpatient Medications:   Allergies:  Allergies  Allergen Reactions  . Lisinopril Other (See Comments)    Cough    Social History   Social History  . Marital Status: Widowed    Spouse Name: N/A  . Number of Children: N/A  . Years of Education: N/A   Occupational History  . Not on file.   Social History Main Topics  . Smoking status: Never Smoker   . Smokeless tobacco: Never Used  . Alcohol Use: No  . Drug Use: No  . Sexual Activity: No   Other Topics Concern  . Not on file   Social History Narrative     Family History  Problem Relation Age of Onset  . Alzheimer's disease Sister   . Cardiomyopathy Brother      Review of Systems: All other systems reviewed and are otherwise negative except as noted above.  Physical Exam: Filed Vitals:   11/24/15 0832 11/24/15 0833  BP: 144/82 144/82  Pulse: 89 105  Temp:  97.3 F (36.3 C)  TempSrc:  Oral  Resp: 11 18  SpO2: 100% 99%    GEN- The patient is elderly and thin appearing,  alert and oriented x 3 today.   HEENT: normocephalic, atraumatic; sclera clear, conjunctiva pink; hearing intact; oropharynx clear; neck supple  Lungs- Clear to ausculation bilaterally, normal work of breathing.  No wheezes, rales, rhonchi Heart- Tachycardic irregular rate and rhythm  GI- soft, non-tender, non-distended, bowel sounds present  Extremities- no clubbing, cyanosis, or edema; DP/PT/radial pulses 2+ bilaterally MS- no significant deformity or atrophy Skin- warm and dry, no rash or lesion Psych- euthymic mood, full affect Neuro- strength and sensation are intact  Labs:   Lab Results  Component Value Date   WBC 6.2 11/24/2015   HGB  13.8 11/24/2015   HCT 43.8 11/24/2015   MCV 93.4 11/24/2015   PLT 214 11/24/2015    Recent Labs Lab 11/24/15 0845  NA 140  K 4.2  CL 109  CO2 23  BUN 13  CREATININE 1.18*  CALCIUM 9.4  GLUCOSE 103*      Radiology/Studies: No results found.  [id number] fibrillation, ventricular rate 115  TELEMETRY: atrial fibrillation, v rates 120's  Assessment/Plan: 1.  Paroxysmal atrial fibrillation The patient has recurrent symptomatic atrial fibrillation despite Multaq (once daily).  She is appropriately anticoagulated with Xarelto (renally dosed) Medication options are limited with bradycardia We have discussed cardioversion and staying on Multaq vs PPM implant which would allow more medication options  With missed doses of Xarelto last week and pt not 100% sure she went into AF within last 48 hours, [name] Small give 12.5mg  of Metoprolol now and see if rates improve enough to discharge home.   2. HTN Stable No change required today  3. Elevated troponin Chronically elevated No ischemic symptoms No further work up planned    Signed, [name] Marshall, NP 11/24/2015 10:31 AM   Addendum: Pt's rate much improved after low dose Metoprolol. Pt has not had symptoms of dizziness or pre-syncope before even while wearing monitor and bradycardia noted.  Discussed with patient and family. [name] Small plan to discharge from ER with Metoprolol 12.5mg  bid - pt aware she can take extra 12.5mg  of Metoprolol if needed for tachycardia. [name] Small discontinue Multaq as it is ineffective. Have discussed starting amiodarone on Friday when seen by [name] Small in the AF clinic.  I think she would tolerate low dose amiodarone as she has not been symptomatic with bradycardia in the past.   [name] Marshall, NP 11/24/2015 12:47 PM      I have seen and examined this patient with [name] Small.  Agree with above, note added to reflect my findings.  On exam, irregular rhythm, no murmurs, lungs clear.  Presented in atrial fibrillation.   Discussed options of pacemaker vs continued medical management.  Have added metoprolol 12.5 mg BID to her medical regimen.  [name] Small discharge today with improved rate control and see in the office at regularly scheduled appointment.    Lonisha Bobby M. Avanni Turnbaugh MD 11/24/2015 1:27 PM

## 2015-11-24 NOTE — ED Provider Notes (Signed)
CSN: [id number]     Arrival date & time 11/24/15  [id number] History   First MD Initiated Contact with Patient 11/24/15 [phone]     Chief Complaint  Patient presents with  . Weakness  . Irregular Heart Beat     (Consider location/radiation/quality/duration/timing/severity/associated sxs/prior Treatment) HPI Comments: Patient presents with weakness, diaphoresis, subjective hypotension at home. Patient states she woke up feeling generally weak and had trouble dressing and was diaphoretic.  She checked her blood pressure and it was 107/70 which is low for her.  Did not take medications today.  Found to be in Afib, hx of same.  On xarelto.  Does feel palpitations and chest tightness.  Unsure with Afib began.  Felt some generalized weakness. No falls.  No syncope or dizziness. Constant chest tightness since this morning.  Some diaphoresis, no nausea, vomiting or fever.  The history is provided by the patient.    Past Medical History  Diagnosis Date  . DIVERTICULOSIS, COLON 11/22/2006  . HYPERLIPIDEMIA 11/22/2006  . OSTEOPOROSIS 11/22/2006  . Menopausal syndrome (hot flashes)   . Headache(784.0)   . Cancer ([place]) Meloanoma left leg  . Hypertension   . Atrial fibrillation [facility])    Past Surgical History  Procedure Laterality Date  . Appendectomy    . Cholecystectomy    . Bunionectomy    . Femur im nail Left 07/08/2014    Procedure: INTRAMEDULLARY (IM) NAIL ;  Surgeon: Meredith Pel, MD;  Location: [facility];  Service: Orthopedics;  Laterality: Left;   Family History  Problem Relation Age of Onset  . Alzheimer's disease Sister   . Cardiomyopathy Brother    Social History  Substance Use Topics  . Smoking status: Never Smoker   . Smokeless tobacco: Never Used  . Alcohol Use: No   OB History    No data available     Review of Systems  Constitutional: Positive for diaphoresis, activity change, appetite change and fatigue. Negative for fever.  HENT: Negative for congestion.   Eyes: Negative  for visual disturbance.  Respiratory: Positive for chest tightness. Negative for cough and shortness of breath.   Cardiovascular: Negative for chest pain.  Gastrointestinal: Negative for nausea, vomiting and abdominal pain.  Genitourinary: Negative for dysuria, vaginal bleeding and vaginal discharge.  Musculoskeletal: Negative for myalgias and arthralgias.  Neurological: Positive for weakness. Negative for dizziness, light-headedness and headaches.  A complete 10 system review of systems was obtained and all systems are negative except as noted in the HPI and PMH.      Allergies  Lisinopril  Home Medications   Prior to Admission medications   Medication Sig Start Date End Date Taking? Authorizing Provider  acetaminophen (TYLENOL) 500 MG tablet Take 500 mg by mouth every 6 (six) hours as needed for mild pain or headache.     Historical Provider, MD  atorvastatin (LIPITOR) 40 MG tablet TAKE 1 TABLET BY MOUTH DAILY 06/30/15   Marletta Lor, MD  Calcium Carbonate-Vitamin D (CALTRATE 600+D PO) Take 1 tablet by mouth daily.      Historical Provider, MD  dronedarone (MULTAQ) 400 MG tablet Take 1 tablet (400 mg total) by mouth daily. 06/14/15   Josue Hector, MD  losartan (COZAAR) 100 MG tablet TAKE 1 TABLET BY MOUTH DAILY 07/20/15   Marletta Lor, MD  Polyvinyl Alcohol-Povidone (REFRESH OP) Place 1 drop into both eyes 2 (two) times daily.    Historical Provider, MD  Rivaroxaban (XARELTO) 15 MG TABS tablet Take 1 tablet (  15 mg total) by mouth daily with supper. 12/16/14   Josue Hector, MD   BP 144/82 mmHg  Pulse 105  Temp(Src) 97.3 F (36.3 C) (Oral)  Resp 18  SpO2 99% Physical Exam  Constitutional: She is oriented to person, place, and time. She appears well-developed and well-nourished. No distress.  HENT:  Head: Normocephalic and atraumatic.  Mouth/Throat: Oropharynx is clear and moist. No oropharyngeal exudate.  Eyes: Conjunctivae and EOM are normal. Pupils are equal,  round, and reactive to light.  Neck: Normal range of motion. Neck supple.  No meningismus.  Cardiovascular: Normal rate, normal heart sounds and intact distal pulses.   No murmur heard. Irregular rhythm rates 80-110s  Pulmonary/Chest: Effort normal and breath sounds normal. No respiratory distress.  Abdominal: Soft. There is no tenderness. There is no rebound and no guarding.  Musculoskeletal: Normal range of motion. She exhibits no edema or tenderness.  Neurological: She is alert and oriented to person, place, and time. No cranial nerve deficit. She exhibits normal muscle tone. Coordination normal.  No ataxia on finger to nose bilaterally. No pronator drift. 5/5 strength throughout. CN 2-12 intact.Equal grip strength. Sensation intact.   Skin: Skin is warm.  Psychiatric: She has a normal mood and affect. Her behavior is normal.  Nursing note and vitals reviewed.   ED Course  Procedures (including critical care time) Labs Review Labs Reviewed  BASIC METABOLIC PANEL - Abnormal; Notable for the following:    Glucose, Bld 103 (*)    Creatinine, Ser 1.18 (*)    GFR calc non Af Amer 41 (*)    GFR calc Af Amer 48 (*)    All other components within normal limits  URINALYSIS, ROUTINE W REFLEX MICROSCOPIC (NOT AT [facility]) - Abnormal; Notable for the following:    APPearance CLOUDY (*)    Leukocytes, UA MODERATE (*)    All other components within normal limits  BRAIN NATRIURETIC PEPTIDE - Abnormal; Notable for the following:    B Natriuretic Peptide 519.9 (*)    All other components within normal limits  TROPONIN I - Abnormal; Notable for the following:    Troponin I 0.05 (*)    All other components within normal limits  URINE MICROSCOPIC-ADD ON - Abnormal; Notable for the following:    Squamous Epithelial / LPF 0-5 (*)    Bacteria, UA RARE (*)    Casts HYALINE CASTS (*)    All other components within normal limits  CBC    Imaging Review Dg Chest 2 View  11/24/2015  CLINICAL DATA:   Shortness of breath. EXAM: CHEST  2 VIEW COMPARISON:  01/27/2015 FINDINGS: ECG skin marker overlying the right upper chest. Lungs are clear without airspace disease or pulmonary edema. Heart and mediastinum are within normal limits. Atherosclerotic calcifications at the aortic arch. Trachea is midline. No large pleural effusions. No acute bone abnormality. IMPRESSION: No active cardiopulmonary disease. Aortic atherosclerosis. Electronically Signed   By: Markus Daft M.D.   On: 11/24/2015 10:31   I have personally reviewed and evaluated these images and lab results as part of my medical decision-making.   EKG Interpretation   Date/Time:  Wednesday November 24 2015 08:29:21 EDT Ventricular Rate:  115 PR Interval:    QRS Duration: 85 QT Interval:  331 QTC Calculation: 480 R Axis:   -32 Text Interpretation:  Atrial fibrillation Left axis deviation RSR' in V1  or V2, right VCD or RVH Nonspecific T abnrm, anterolateral leads now  atrial fibrillation Confirmed by  Stephens Shreve  MD, Annie Main [phone]) on 11/24/2015  9:00:55 AM      MDM   Final diagnoses:  Atrial fibrillation, unspecified type ([place])  Urinary tract infection without hematuria, site unspecified   Generalized weakness, diaphoresis, chest tightness.  Afib with variable rates.    Patient denies chest pain. Does have troponin elevation similar previous.  She did not take Xarelto today and had two missed doses last week. Not a good candidate for cardioversion.  Troponins are borderline elevated similar to previous. Rate remains controlled in 80s.  Seen by cardiology. We'll start metoprolol 12.5 mg twice a day. Stop Multaq. He will be seen in the A. fib clinic in 2 days.  Feels well. Stable minimal troponin elevation. Tolerating PO and ambulatory.  Follow in A fib clinic in 2 days. Return precautions discussed. Treat UTI.  Ezequiel Essex, MD 11/24/15 [phone]

## 2015-11-24 NOTE — ED Notes (Signed)
Pt states she understands instructions. Home stable with family\. 

## 2015-11-24 NOTE — ED Notes (Signed)
Pt in from home reports diaphoresis, weakness, & a lower than usual BP at home 107/70, pt hx of a fib takes Xerelto, pt in a fib upon arrival to ED, pt denies CP, SOB, n/v/d, onset yesterday, pt takes Dronedarone 400 mg tab for a fib, A&O x4

## 2015-11-25 ENCOUNTER — Encounter (HOSPITAL_COMMUNITY)
Admission: EM | Disposition: A | Discharge status: Home / Self Care | Payer: Self-pay | Source: Home / Self Care | Attending: Emergency Medicine

## 2015-11-25 ENCOUNTER — Encounter (HOSPITAL_COMMUNITY): Payer: Self-pay

## 2015-11-25 ENCOUNTER — Ambulatory Visit (HOSPITAL_COMMUNITY)
Admission: EM | Admit: 2015-11-25 | Discharge: 2015-11-26 | Disposition: A | Discharge status: Home / Self Care | Payer: Medicare Other | Attending: Emergency Medicine | Admitting: Emergency Medicine

## 2015-11-25 DIAGNOSIS — I480 Paroxysmal atrial fibrillation: Secondary | ICD-10-CM | POA: Insufficient documentation

## 2015-11-25 DIAGNOSIS — E785 Hyperlipidemia, unspecified: Secondary | ICD-10-CM | POA: Insufficient documentation

## 2015-11-25 DIAGNOSIS — N390 Urinary tract infection, site not specified: Secondary | ICD-10-CM | POA: Insufficient documentation

## 2015-11-25 DIAGNOSIS — I4891 Unspecified atrial fibrillation: Secondary | ICD-10-CM | POA: Diagnosis not present

## 2015-11-25 DIAGNOSIS — I495 Sick sinus syndrome: Secondary | ICD-10-CM | POA: Diagnosis not present

## 2015-11-25 DIAGNOSIS — I10 Essential (primary) hypertension: Secondary | ICD-10-CM | POA: Diagnosis not present

## 2015-11-25 DIAGNOSIS — I482 Chronic atrial fibrillation, unspecified: Secondary | ICD-10-CM | POA: Diagnosis present

## 2015-11-25 DIAGNOSIS — Z95818 Presence of other cardiac implants and grafts: Secondary | ICD-10-CM

## 2015-11-25 HISTORY — DX: Presence of cardiac pacemaker: Z95.0

## 2015-11-25 HISTORY — DX: Chronic or unspecified gastric ulcer with hemorrhage: K25.4

## 2015-11-25 HISTORY — PX: EP IMPLANTABLE DEVICE: SHX172B

## 2015-11-25 HISTORY — DX: Personal history of other medical treatment: Z92.89

## 2015-11-25 LAB — BASIC METABOLIC PANEL
ANION GAP: 7 (ref 5–15)
BUN: 16 mg/dL (ref 6–20)
CHLORIDE: 109 mmol/L (ref 101–111)
CO2: 24 mmol/L (ref 22–32)
Calcium: 9.5 mg/dL (ref 8.9–10.3)
Creatinine, Ser: 1.2 mg/dL — ABNORMAL HIGH (ref 0.44–1.00)
GFR calc Af Amer: 47 mL/min — ABNORMAL LOW (ref >60)
GFR, EST NON AFRICAN AMERICAN: 40 mL/min — AB (ref >60)
GLUCOSE: 100 mg/dL — AB (ref 65–99)
POTASSIUM: 4.5 mmol/L (ref 3.5–5.1)
Sodium: 140 mmol/L (ref 135–145)

## 2015-11-25 LAB — TROPONIN I: Troponin I: 0.06 ng/mL (ref <0.03)

## 2015-11-25 LAB — CBC WITH DIFFERENTIAL/PLATELET
BASOS ABS: 0 10*3/uL (ref 0.0–0.1)
Basophils Relative: 0 %
EOS PCT: 1 %
Eosinophils Absolute: 0.1 10*3/uL (ref 0.0–0.7)
HEMATOCRIT: 45.7 % (ref 36.0–46.0)
HEMOGLOBIN: 14.7 g/dL (ref 12.0–15.0)
LYMPHS PCT: 20 %
Lymphs Abs: 1.4 10*3/uL (ref 0.7–4.0)
MCH: 29.7 pg (ref 26.0–34.0)
MCHC: 32.2 g/dL (ref 30.0–36.0)
MCV: 92.3 fL (ref 78.0–100.0)
Monocytes Absolute: 0.6 10*3/uL (ref 0.1–1.0)
Monocytes Relative: 9 %
NEUTROS ABS: 5.2 10*3/uL (ref 1.7–7.7)
NEUTROS PCT: 70 %
PLATELETS: 231 10*3/uL (ref 150–400)
RBC: 4.95 MIL/uL (ref 3.87–5.11)
RDW: 13.8 % (ref 11.5–15.5)
WBC: 7.3 10*3/uL (ref 4.0–10.5)

## 2015-11-25 SURGERY — PACEMAKER IMPLANT
Anesthesia: LOCAL

## 2015-11-25 MED ORDER — METOPROLOL TARTRATE 12.5 MG HALF TABLET: 12.5000 mg | ORAL_TABLET | Freq: Two times a day (BID) | ORAL | Status: DC

## 2015-11-25 MED ORDER — MIDAZOLAM HCL 5 MG/5ML IJ SOLN
INTRAMUSCULAR | Status: DC | PRN
  Administered 2015-11-25: 1 mg via INTRAVENOUS

## 2015-11-25 MED ORDER — LIDOCAINE HCL (PF) 1 % IJ SOLN
INTRAMUSCULAR | Status: AC
  Filled 2015-11-25: qty 60

## 2015-11-25 MED ORDER — ONDANSETRON HCL 4 MG/2ML IJ SOLN: 4.0000 mg | Freq: Four times a day (QID) | INTRAMUSCULAR | Status: DC | PRN

## 2015-11-25 MED ORDER — FENTANYL CITRATE (PF) 100 MCG/2ML IJ SOLN
INTRAMUSCULAR | Status: DC | PRN
  Administered 2015-11-25: 25 ug via INTRAVENOUS

## 2015-11-25 MED ORDER — CEPHALEXIN 500 MG PO CAPS: 500.0000 mg | ORAL_CAPSULE | Freq: Three times a day (TID) | ORAL | Status: DC

## 2015-11-25 MED ORDER — SODIUM CHLORIDE 0.9 % IR SOLN
Status: AC
  Filled 2015-11-25: qty 2

## 2015-11-25 MED ORDER — FENTANYL CITRATE (PF) 100 MCG/2ML IJ SOLN
INTRAMUSCULAR | Status: AC
  Filled 2015-11-25: qty 2

## 2015-11-25 MED ORDER — ATORVASTATIN CALCIUM 40 MG PO TABS
40.0000 mg | ORAL_TABLET | Freq: Every day | ORAL | Status: DC
  Administered 2015-11-25 – 2015-11-26 (×2): 40 mg via ORAL
  Filled 2015-11-25 (×2): qty 1

## 2015-11-25 MED ORDER — LIDOCAINE HCL (PF) 1 % IJ SOLN
INTRAMUSCULAR | Status: DC | PRN
  Administered 2015-11-25: 45 mL via SUBCUTANEOUS

## 2015-11-25 MED ORDER — LOSARTAN POTASSIUM 50 MG PO TABS
100.0000 mg | ORAL_TABLET | Freq: Every day | ORAL | Status: DC
  Administered 2015-11-25: 100 mg via ORAL
  Filled 2015-11-25: qty 2

## 2015-11-25 MED ORDER — ENSURE ENLIVE PO LIQD
237.0000 mL | Freq: Two times a day (BID) | ORAL | Status: DC
  Filled 2015-11-25 (×4): qty 237

## 2015-11-25 MED ORDER — ACETAMINOPHEN 500 MG PO TABS: 500.0000 mg | ORAL_TABLET | Freq: Four times a day (QID) | ORAL | Status: DC | PRN

## 2015-11-25 MED ORDER — CHLORHEXIDINE GLUCONATE 4 % EX LIQD: 60.0000 mL | Freq: Once | CUTANEOUS | Status: DC

## 2015-11-25 MED ORDER — CEFAZOLIN SODIUM-DEXTROSE 2-3 GM-% IV SOLR
INTRAVENOUS | Status: DC | PRN
  Administered 2015-11-25: 2 g via INTRAVENOUS

## 2015-11-25 MED ORDER — GENTAMICIN SULFATE 40 MG/ML IJ SOLN
80.0000 mg | INTRAMUSCULAR | Status: AC
  Administered 2015-11-25: 80 mg
  Filled 2015-11-25: qty 2

## 2015-11-25 MED ORDER — CEFAZOLIN IN D5W 1 GM/50ML IV SOLN
1.0000 g | Freq: Four times a day (QID) | INTRAVENOUS | Status: AC
  Administered 2015-11-25 – 2015-11-26 (×3): 1 g via INTRAVENOUS
  Filled 2015-11-25 (×3): qty 50

## 2015-11-25 MED ORDER — MIDAZOLAM HCL 5 MG/5ML IJ SOLN
INTRAMUSCULAR | Status: AC
  Filled 2015-11-25: qty 5

## 2015-11-25 MED ORDER — CEPHALEXIN 500 MG PO CAPS: 500.0000 mg | ORAL_CAPSULE | Freq: Two times a day (BID) | ORAL | Status: DC

## 2015-11-25 MED ORDER — ACETAMINOPHEN 325 MG PO TABS
325.0000 mg | ORAL_TABLET | ORAL | Status: DC | PRN
  Filled 2015-11-25: qty 1
  Filled 2015-11-25: qty 2

## 2015-11-25 MED ORDER — SODIUM CHLORIDE 0.9 % IV SOLN
INTRAVENOUS | Status: DC
  Administered 2015-11-25: 13:00:00 via INTRAVENOUS

## 2015-11-25 MED ORDER — METOPROLOL TARTRATE 25 MG PO TABS
25.0000 mg | ORAL_TABLET | Freq: Three times a day (TID) | ORAL | Status: DC
  Administered 2015-11-25 – 2015-11-26 (×3): 25 mg via ORAL
  Filled 2015-11-25 (×3): qty 1

## 2015-11-25 MED ORDER — RIVAROXABAN 15 MG PO TABS
15.0000 mg | ORAL_TABLET | Freq: Every day | ORAL | Status: DC
  Administered 2015-11-25: 15 mg via ORAL
  Filled 2015-11-25: qty 1

## 2015-11-25 MED ORDER — CEFAZOLIN SODIUM-DEXTROSE 2-4 GM/100ML-% IV SOLN
2.0000 g | INTRAVENOUS | Status: AC
  Filled 2015-11-25: qty 100

## 2015-11-25 MED ORDER — HEPARIN (PORCINE) IN NACL 2-0.9 UNIT/ML-% IJ SOLN
INTRAMUSCULAR | Status: DC | PRN
  Administered 2015-11-25: 500 mL

## 2015-11-25 MED ORDER — HEPARIN (PORCINE) IN NACL 2-0.9 UNIT/ML-% IJ SOLN
INTRAMUSCULAR | Status: AC
  Filled 2015-11-25: qty 500

## 2015-11-25 MED ORDER — CEFAZOLIN SODIUM-DEXTROSE 2-4 GM/100ML-% IV SOLN
INTRAVENOUS | Status: AC
  Filled 2015-11-25: qty 100

## 2015-11-25 SURGICAL SUPPLY — 8 items
CABLE SURGICAL S-101-97-12 (CABLE) ×1 IMPLANT
LEAD CAPSURE NOVUS 45CM (Lead) ×1 IMPLANT
LEAD CAPSURE NOVUS 5076-52CM (Lead) ×1 IMPLANT
PAD DEFIB LIFELINK (PAD) ×1 IMPLANT
PPM ADVISA MRI DR A2DR01 (Pacemaker) ×1 IMPLANT
SHEATH CLASSIC 7F (SHEATH) ×2 IMPLANT
SHEATH CLASSIC 7F 25CM (SHEATH) ×1 IMPLANT
TRAY PACEMAKER INSERTION (PACKS) ×1 IMPLANT

## 2015-11-25 NOTE — ED Notes (Signed)
Per PT, Pt started to feel SOB and weak on July 4th. Pt was seen her yesterday and discharged home with Atrial fibrillation. Pt states that she woke up this morning and she was unable to walk with increased SOB.

## 2015-11-25 NOTE — H&P (Signed)
ELECTROPHYSIOLOGY HISTORY AND PHYSICAL    Patient ID: [name] Small MRN: [id number], DOB/AGE: 07/17/1928 82 y.o.  Admit date: 11/25/2015 Date of Consult: 11/25/2015  Primary Physician: Nyoka Cowden, MD Primary Cardiologist: Johnsie Cancel Electrophysiologist: Curt Bears  Reason for Consultation: AF with RVR   HPI:  [name] Small is a 82 y.o. female with a past medical history significant for hypertension, hyperlipidemia, and paroxysmal atrial fibrillation.  Bradycardia has limited medication options for atrial fibrillation.  She went into atrial fibrillation earlier this week and was seen in the ER yesterday for AF with RVR.  Rates improved significantly with Metoprolol while in the ER but patient did not fill her prescription yesterday afternoon.  This morning, she had progressive weakness and presented back to the ER for further evaluation.   She currently reports fatigue and weakness.  She denies chest pain, shortness of breath, dizziness, syncope. No fevers or chills.   Past Medical History  Diagnosis Date  . DIVERTICULOSIS, COLON 11/22/2006  . HYPERLIPIDEMIA 11/22/2006  . OSTEOPOROSIS 11/22/2006  . Menopausal syndrome (hot flashes)   . Headache(784.0)   . Cancer ([place]) Meloanoma left leg  . Hypertension   . Atrial fibrillation [facility])      Surgical History:  Past Surgical History  Procedure Laterality Date  . Appendectomy    . Cholecystectomy    . Bunionectomy    . Femur im nail Left 07/08/2014    Procedure: INTRAMEDULLARY (IM) NAIL ;  Surgeon: Meredith Pel, MD;  Location: [facility];  Service: Orthopedics;  Laterality: Left;     Current outpatient prescriptions:  .  acetaminophen (TYLENOL) 500 MG tablet, Take 500 mg by mouth every 6 (six) hours as needed for mild pain or headache. , Disp: , Rfl:  .  atorvastatin (LIPITOR) 40 MG tablet, TAKE 1 TABLET BY MOUTH DAILY, Disp: 90 tablet, Rfl: 1 .  Calcium Carbonate-Vitamin D (CALTRATE 600+D PO), Take 1 tablet by mouth daily.  , Disp: ,  Rfl:  .  dronedarone (MULTAQ) 400 MG tablet, Take 1 tablet (400 mg total) by mouth daily., Disp: , Rfl:  .  losartan (COZAAR) 100 MG tablet, TAKE 1 TABLET BY MOUTH DAILY, Disp: 90 tablet, Rfl: 1 .  Polyvinyl Alcohol-Povidone (REFRESH OP), Place 1 drop into both eyes 2 (two) times daily., Disp: , Rfl:  .  Rivaroxaban (XARELTO) 15 MG TABS tablet, Take 1 tablet (15 mg total) by mouth daily with supper., Disp: 30 tablet, Rfl: 11   Inpatient Medications:   Allergies:  Allergies  Allergen Reactions  . Lisinopril Other (See Comments)    Cough    Social History   Social History  . Marital Status: Widowed    Spouse Name: N/A  . Number of Children: N/A  . Years of Education: N/A   Occupational History  . Not on file.   Social History Main Topics  . Smoking status: Never Smoker   . Smokeless tobacco: Never Used  . Alcohol Use: No  . Drug Use: No  . Sexual Activity: No   Other Topics Concern  . Not on file   Social History Narrative     Family History  Problem Relation Age of Onset  . Alzheimer's disease Sister   . Cardiomyopathy Brother      Review of Systems: All other systems reviewed and are otherwise negative except as noted above.  Physical Exam: Filed Vitals:   11/25/15 1030 11/25/15 1045 11/25/15 1100 11/25/15 1115  BP: 108/80 111/67 118/71 127/97  Pulse: 105  Resp: 22 26 21 22   Height:      Weight:      SpO2: 100% 100% 99% 95%    GEN- The patient is elderly and thin appearing, alert and oriented x 3 today.   HEENT: normocephalic, atraumatic; sclera clear, conjunctiva pink; hearing intact; oropharynx clear; neck supple  Lungs- Clear to ausculation bilaterally, normal work of breathing.  No wheezes, rales, rhonchi Heart- Tachycardic irregular rate and rhythm  GI- soft, non-tender, non-distended, bowel sounds present  Extremities- no clubbing, cyanosis, or edema; DP/PT/radial pulses 2+ bilaterally MS- no significant deformity or atrophy Skin- warm and  dry, no rash or lesion Psych- euthymic mood, full affect Neuro- strength and sensation are intact  Labs:   Lab Results  Component Value Date   WBC 7.3 11/25/2015   HGB 14.7 11/25/2015   HCT 45.7 11/25/2015   MCV 92.3 11/25/2015   PLT 231 11/25/2015     Recent Labs Lab 11/25/15 0845  NA 140  K 4.5  CL 109  CO2 24  BUN 16  CREATININE 1.20*  CALCIUM 9.5  GLUCOSE 100*      Radiology/Studies: Dg Chest 2 View  11/24/2015  CLINICAL DATA:  Shortness of breath. EXAM: CHEST  2 VIEW COMPARISON:  01/27/2015 FINDINGS: ECG skin marker overlying the right upper chest. Lungs are clear without airspace disease or pulmonary edema. Heart and mediastinum are within normal limits. Atherosclerotic calcifications at the aortic arch. Trachea is midline. No large pleural effusions. No acute bone abnormality. IMPRESSION: No active cardiopulmonary disease. Aortic atherosclerosis. Electronically Signed   By: Markus Daft M.D.   On: 11/24/2015 10:31    [id number] fibrillation, ventricular rate 115  TELEMETRY: atrial fibrillation, v rates 120's  Assessment/Plan: 1.  Paroxysmal atrial fibrillation The patient has recurrent symptomatic atrial fibrillation despite Multaq (once daily).  She is appropriately anticoagulated with Xarelto (renally dosed) Medication options are limited with bradycardia Risks, benefits to pacemaker implantation for tachy/brady discussed with the patient who wishes to proceed. Ayan Heffington plan for later today.   2. HTN Stable No change required today  3. Elevated troponin Chronically elevated No ischemic symptoms No further work up planned    Signed, Chanetta Marshall, NP 11/25/2015 11:32 AM    I have seen and examined this patient with Chanetta Marshall.  Agree with above, note added to reflect my findings.  On exam, tachycardic, irregular, no murmurs, lungs clear.  Presented in atrial fibrillation after being in the ER yesterday for the same issue.  Worsening fatigue.  Discussed  options of pacemaker placement.  Patient agrees to pacemaker.  Risks and benefits explained.  Risks include bleeding, infection, tamponade, pneumothorax.  Patient understands these risks.    Thiago Ragsdale M. Kaylee Wombles MD 11/25/2015 11:35 AM

## 2015-11-25 NOTE — ED Provider Notes (Signed)
CSN: [id number]     Arrival date & time 11/25/15  0810 History   First MD Initiated Contact with Patient 11/25/15 [phone]     Chief Complaint  Patient presents with  . Shortness of Breath     (Consider location/radiation/quality/duration/timing/severity/associated sxs/prior Treatment) HPI.... Patient presents today with generalized weakness. No substernal chest pain, fever, sweats, chills, cough.  Dyspnea has persisted. She was evaluated in the emergency department yesterday for atrial fibrillation and discharged home. A cardiology consult was obtained at that time and several options were given to the patient including medication changes, cardioversion, pacemaker. Severity of symptoms is moderate.  Past Medical History  Diagnosis Date  . DIVERTICULOSIS, COLON 11/22/2006  . HYPERLIPIDEMIA 11/22/2006  . OSTEOPOROSIS 11/22/2006  . Menopausal syndrome (hot flashes)   . Headache(784.0)   . Cancer ([place]) Meloanoma left leg  . Hypertension   . Atrial fibrillation [facility])    Past Surgical History  Procedure Laterality Date  . Appendectomy    . Cholecystectomy    . Bunionectomy    . Femur im nail Left 07/08/2014    Procedure: INTRAMEDULLARY (IM) NAIL ;  Surgeon: Meredith Pel, MD;  Location: [facility];  Service: Orthopedics;  Laterality: Left;   Family History  Problem Relation Age of Onset  . Alzheimer's disease Sister   . Cardiomyopathy Brother    Social History  Substance Use Topics  . Smoking status: Never Smoker   . Smokeless tobacco: Never Used  . Alcohol Use: No   OB History    No data available     Review of Systems  All other systems reviewed and are negative.     Allergies  Lisinopril  Home Medications   Prior to Admission medications   Medication Sig Start Date End Date Taking? Authorizing Provider  acetaminophen (TYLENOL) 500 MG tablet Take 500 mg by mouth every 6 (six) hours as needed for mild pain or headache.    Yes Historical Provider, MD  Calcium  Carbonate-Vitamin D (CALTRATE 600+D PO) Take 1 tablet by mouth daily.     Yes Historical Provider, MD  Polyvinyl Alcohol-Povidone (REFRESH OP) Place 1 drop into both eyes 2 (two) times daily.   Yes Historical Provider, MD  Rivaroxaban (XARELTO) 15 MG TABS tablet Take 1 tablet (15 mg total) by mouth daily with supper. 12/16/14  Yes Josue Hector, MD  atorvastatin (LIPITOR) 40 MG tablet TAKE 1 TABLET BY MOUTH DAILY 06/30/15   Marletta Lor, MD  cephALEXin (KEFLEX) 500 MG capsule Take 1 capsule (500 mg total) by mouth 3 (three) times daily. 11/24/15   Ezequiel Essex, MD  losartan (COZAAR) 100 MG tablet TAKE 1 TABLET BY MOUTH DAILY 07/20/15   Marletta Lor, MD  metoprolol (LOPRESSOR) 25 MG tablet Take 0.5 tablets (12.5 mg total) by mouth 2 (two) times daily. 11/24/15   Ezequiel Essex, MD   BP 127/97 mmHg  Pulse 105  Resp 22  Ht 5\' 5"  (1.651 m)  Wt 125 lb (56.7 kg)  BMI 20.80 kg/m2  SpO2 95% Physical Exam  Constitutional: She is oriented to person, place, and time. She appears well-developed and well-nourished.  HENT:  Head: Normocephalic and atraumatic.  Eyes: Conjunctivae and EOM are normal. Pupils are equal, round, and reactive to light.  Neck: Normal range of motion. Neck supple.  Cardiovascular:  Tachycardic, irregularly irregular  Pulmonary/Chest: Effort normal and breath sounds normal.  Abdominal: Soft. Bowel sounds are normal.  Musculoskeletal: Normal range of motion.  Neurological: She is alert  and oriented to person, place, and time.  Skin: Skin is warm and dry.  Psychiatric: She has a normal mood and affect. Her behavior is normal.  Nursing note and vitals reviewed.   ED Course  Procedures (including critical care time) Labs Review Labs Reviewed  BASIC METABOLIC PANEL - Abnormal; Notable for the following:    Glucose, Bld 100 (*)    Creatinine, Ser 1.20 (*)    GFR calc non Af Amer 40 (*)    GFR calc Af Amer 47 (*)    All other components within normal limits   TROPONIN I - Abnormal; Notable for the following:    Troponin I 0.06 (*)    All other components within normal limits  CBC WITH DIFFERENTIAL/PLATELET    Imaging Review Dg Chest 2 View  11/24/2015  CLINICAL DATA:  Shortness of breath. EXAM: CHEST  2 VIEW COMPARISON:  01/27/2015 FINDINGS: ECG skin marker overlying the right upper chest. Lungs are clear without airspace disease or pulmonary edema. Heart and mediastinum are within normal limits. Atherosclerotic calcifications at the aortic arch. Trachea is midline. No large pleural effusions. No acute bone abnormality. IMPRESSION: No active cardiopulmonary disease. Aortic atherosclerosis. Electronically Signed   By: Markus Daft M.D.   On: 11/24/2015 10:31   I have personally reviewed and evaluated these images and lab results as part of my medical decision-making.   EKG Interpretation   Date/Time:  Thursday November 25 2015 08:14:22 EDT Ventricular Rate:  113 PR Interval:    QRS Duration: 80 QT Interval:  340 QTC Calculation: 466 R Axis:   -9 Text Interpretation:  Atrial fibrillation with rapid ventricular response  with premature ventricular or aberrantly conducted complexes RSR' or QR  pattern in V1 suggests right ventricular conduction delay Septal infarct ,  age undetermined Abnormal ECG Confirmed by Latha Staunton  MD, Shonia Skilling ([id number]) on  11/25/2015 9:04:05 AM Also confirmed by Lacinda Axon  MD, Taylen Wendland ([id number])  on 11/25/2015  10:48:26 AM      MDM   Final diagnoses:  Atrial fibrillation, unspecified type ([place])    Second visit to the emergency department in 2 days for weakness and atrial fibrillation. Her blood pressure is stable. She is alert. Will consult cardiology for further recommendations.    Nat Christen, MD 11/26/15 [phone]

## 2015-11-25 NOTE — ED Notes (Signed)
Cardiology at the bedside.

## 2015-11-26 ENCOUNTER — Ambulatory Visit (HOSPITAL_COMMUNITY): Payer: Medicare Other

## 2015-11-26 ENCOUNTER — Ambulatory Visit (HOSPITAL_COMMUNITY): Payer: Medicare Other | Admitting: Nurse Practitioner

## 2015-11-26 DIAGNOSIS — I480 Paroxysmal atrial fibrillation: Secondary | ICD-10-CM | POA: Diagnosis not present

## 2015-11-26 DIAGNOSIS — I10 Essential (primary) hypertension: Secondary | ICD-10-CM | POA: Diagnosis not present

## 2015-11-26 DIAGNOSIS — E785 Hyperlipidemia, unspecified: Secondary | ICD-10-CM | POA: Diagnosis not present

## 2015-11-26 DIAGNOSIS — N390 Urinary tract infection, site not specified: Secondary | ICD-10-CM | POA: Diagnosis not present

## 2015-11-26 DIAGNOSIS — Z45018 Encounter for adjustment and management of other part of cardiac pacemaker: Secondary | ICD-10-CM | POA: Diagnosis not present

## 2015-11-26 DIAGNOSIS — I495 Sick sinus syndrome: Secondary | ICD-10-CM | POA: Diagnosis not present

## 2015-11-26 MED ORDER — OFF THE BEAT BOOK
Freq: Once | Status: AC
  Administered 2015-11-26: 04:00:00
  Filled 2015-11-26: qty 1

## 2015-11-26 MED ORDER — AMIODARONE HCL 200 MG PO TABS: 200.0000 mg | ORAL_TABLET | Freq: Every day | ORAL | Status: DC

## 2015-11-26 MED ORDER — YOU HAVE A PACEMAKER BOOK
Freq: Once | Status: AC
  Administered 2015-11-26: 04:00:00
  Filled 2015-11-26: qty 1

## 2015-11-26 NOTE — Discharge Summary (Signed)
ELECTROPHYSIOLOGY PROCEDURE DISCHARGE SUMMARY    Patient ID: [name] Small,  MRN: [id number], DOB/AGE: 80/23/1930 80 y.o.  Admit date: 11/25/2015 Discharge date: 11/26/2015  Primary Care Physician: Nyoka Cowden, MD Primary Cardiologist: Dr. Johnsie Cancel Electrophysiologist: Dr. Curt Bears  Primary Discharge Diagnosis:  1. Tachy-brady syndrome      S/p PPM implant this admission  Secondary Discharge Diagnosis:  1. Paroxysmal Afib     CHA2DS2Vasc is at least 4, on Xarelto 2. HTN 3. Chronic Troponin elevation  Allergies  Allergen Reactions  . Lisinopril Other (See Comments)    Cough     Procedures This Admission:  1.  Implantation of a MDT dual chamber PPM on 11/25/15 by Dr Curt Bears.  The patient received a Medtronic Advisa DR MRI model A2DR01 1 (serial number [id number]) pacemaker,Medtronic model 5076 (serial number [id number]) right atrial lead and a Medtronic model 5076 (serial number [id number]) right ventricular lead. There were no immediate post procedure complications. 2.  CXR on 11/26/15 demonstrated no pneumothorax status post device implantation.   Brief HPI: [name] Small is a 82 y.o. female was admitted with recurrent symptomatic Afib with known history of bradycardia, underwent PPM implant this admission.   Hospital Course:  The patient was admitted and underwent implantation of a PPM with details as outlined above.  She was monitored on telemetry overnight which demonstrated SR with A pacing, V sensing this AM.  Left chest was without hematoma or ecchymosis.  The device was interrogated and found to be functioning normally.  CXR was obtained and demonstrated no pneumothorax status post device implantation.  Wound care, arm mobility, and restrictions were reviewed with the patient.  Her Multaq is being discontinued to start amiodarone.  She had been prescribed Keflex for UTI at the ED 11/24/15 she Arabia Nylund continue this as was prescribed to completion and instructed to f/u  with her PMD regarding this.  The patient is feeling well this morning, was examined by Dr. Curt Bears and considered stable for discharge to home.    Physical Exam: Filed Vitals:   11/25/15 2100 11/25/15 2207 11/25/15 2300 11/26/15 0459  BP: 141/68 141/68 132/113 143/93  Pulse: 67 75  61  Temp:    97 F (36.1 C)  TempSrc:    Axillary  Resp: 15  21 12   Height:      Weight:    132 lb 0.9 oz (59.9 kg)  SpO2: 98%   99%    GEN- The patient is well appearing, alert and oriented x 3 today.   HEENT: normocephalic, atraumatic; sclera clear, conjunctiva pink; hearing intact; oropharynx clear; neck supple, no JVP Lungs- Clear to ausculation bilaterally, normal work of breathing.  No wheezes, rales, rhonchi Heart- Regular rate and rhythm, no murmurs, rubs or gallops, PMI not laterally displaced GI- soft, non-tender, non-distended Extremities- no clubbing, cyanosis, or edema MS- no significant deformity or atrophy Skin- warm and dry, no rash or lesion, left chest without hematoma/ecchymosis Psych- euthymic mood, full affect Neuro- no gross deficits   Labs:   Lab Results  Component Value Date   WBC 7.3 11/25/2015   HGB 14.7 11/25/2015   HCT 45.7 11/25/2015   MCV 92.3 11/25/2015   PLT 231 11/25/2015     Recent Labs Lab 11/25/15 0845  NA 140  K 4.5  CL 109  CO2 24  BUN 16  CREATININE 1.20*  CALCIUM 9.5  GLUCOSE 100*    Discharge Medications:    Medication List  TAKE these medications        acetaminophen 500 MG tablet  Commonly known as:  TYLENOL  Take 500 mg by mouth every 6 (six) hours as needed for mild pain or headache.     amiodarone 200 MG tablet  Commonly known as:  PACERONE  Take 1 tablet (200 mg total) by mouth daily.     atorvastatin 40 MG tablet  Commonly known as:  LIPITOR  TAKE 1 TABLET BY MOUTH DAILY     CALTRATE 600+D PO  Take 1 tablet by mouth daily.     cephALEXin 500 MG capsule  Commonly known as:  KEFLEX  Take 1 capsule (500 mg total) by  mouth 3 (three) times daily.     losartan 100 MG tablet  Commonly known as:  COZAAR  TAKE 1 TABLET BY MOUTH DAILY     metoprolol tartrate 25 MG tablet  Commonly known as:  LOPRESSOR  Take 0.5 tablets (12.5 mg total) by mouth 2 (two) times daily.     REFRESH OP  Place 1 drop into both eyes 2 (two) times daily.     Rivaroxaban 15 MG Tabs tablet  Commonly known as:  XARELTO  Take 1 tablet (15 mg total) by mouth daily with supper.        Disposition:  Home    Duration of Discharge Encounter: Greater than 30 minutes including physician time.  Signed, Tommye Standard, PA-C 11/26/2015 8:14 AM    I have seen and examined this patient with Tommye Standard.  Agree with above, note added to reflect my findings.  On exam, regular rhythm, no murmurs, lungs clear.  Presented to the hospital in atrial fibrillation.  Treatment was not possible due to bradycardia and thus pacemaker placed.  Increased metoprolol to 25 BID.  Cassius Cullinane also add amiodarone for rhythm control. Discharge today with return to device clinic in 10 days..    Ruffus Kamaka M. Kory Rains MD 11/26/2015 8:50 AM

## 2015-11-26 NOTE — Discharge Instructions (Signed)
°  Follow up with your primary doctor for follow up on the urinary tract infection, complete antibiotics as prescribed by the ER.       Supplemental Discharge Instructions for  Pacemaker/Defibrillator Patients  Activity No heavy lifting or vigorous activity with your left/right arm for 6 to 8 weeks.  Do not raise your left/right arm above your head for one week.  Gradually raise your affected arm as drawn below.             11/29/15                     11/30/15                    12/01/15                   12/02/15 __  NO DRIVING for 1 week    ; you may begin driving on  [id number]   .  WOUND CARE - Keep the wound area clean and dry.  Do not get this area wet for one week. No showers for one week; you may shower on  12/02/15   . - The tape/steri-strips on your wound will fall off; do not pull them off.  No bandage is needed on the site.  DO  NOT apply any creams, oils, or ointments to the wound area. - If you notice any drainage or discharge from the wound, any swelling or bruising at the site, or you develop a fever > 101? F after you are discharged home, call the office at once.  Special Instructions - You are still able to use cellular telephones; use the ear opposite the side where you have your pacemaker/defibrillator.  Avoid carrying your cellular phone near your device. - When traveling through airports, show security personnel your identification card to avoid being screened in the metal detectors.  Ask the security personnel to use the hand wand. - Avoid arc welding equipment, MRI testing (magnetic resonance imaging), TENS units (transcutaneous nerve stimulators).  Call the office for questions about other devices. - Avoid electrical appliances that are in poor condition or are not properly grounded. - Microwave ovens are safe to be near or to operate.  Additional information for defibrillator patients should your device go off: - If your device goes off ONCE and you feel fine  afterward, notify the device clinic nurses. - If your device goes off ONCE and you do not feel well afterward, call 911. - If your device goes off TWICE, call 911. - If your device goes off THREE times in one day, call 911.  DO NOT DRIVE YOURSELF OR A FAMILY MEMBER WITH A DEFIBRILLATOR TO THE HOSPITAL--CALL 911.

## 2015-12-02 ENCOUNTER — Encounter: Payer: Self-pay | Admitting: Cardiology

## 2015-12-02 ENCOUNTER — Ambulatory Visit (INDEPENDENT_AMBULATORY_CARE_PROVIDER_SITE_OTHER): Payer: Medicare Other | Admitting: *Deleted

## 2015-12-02 DIAGNOSIS — Z950 Presence of cardiac pacemaker: Secondary | ICD-10-CM

## 2015-12-02 DIAGNOSIS — I480 Paroxysmal atrial fibrillation: Secondary | ICD-10-CM | POA: Diagnosis not present

## 2015-12-02 DIAGNOSIS — R001 Bradycardia, unspecified: Secondary | ICD-10-CM

## 2015-12-02 LAB — CUP PACEART INCLINIC DEVICE CHECK
Battery Voltage: 3.1 V
Brady Statistic AP VP Percent: 0.06 %
Brady Statistic RA Percent Paced: 75.51 %
Brady Statistic RV Percent Paced: 0.06 %
Implantable Lead Implant Date: 20170706
Implantable Lead Location: 753859
Implantable Lead Location: 753860
Implantable Lead Model: 5076
Lead Channel Impedance Value: 475 Ohm
Lead Channel Impedance Value: 608 Ohm
Lead Channel Pacing Threshold Amplitude: 1.25 V
Lead Channel Pacing Threshold Pulse Width: 0.4 ms
MDC IDC LEAD IMPLANT DT: 20170706
MDC IDC MSMT LEADCHNL RA SENSING INTR AMPL: 2.125 mV
MDC IDC MSMT LEADCHNL RV IMPEDANCE VALUE: 418 Ohm
MDC IDC MSMT LEADCHNL RV IMPEDANCE VALUE: 475 Ohm
MDC IDC MSMT LEADCHNL RV PACING THRESHOLD AMPLITUDE: 0.75 V
MDC IDC MSMT LEADCHNL RV PACING THRESHOLD PULSEWIDTH: 0.4 ms
MDC IDC MSMT LEADCHNL RV SENSING INTR AMPL: 21.125 mV
MDC IDC SESS DTM: 20170713135409
MDC IDC SET LEADCHNL RA PACING AMPLITUDE: 3.5 V
MDC IDC SET LEADCHNL RV PACING AMPLITUDE: 3.5 V
MDC IDC SET LEADCHNL RV PACING PULSEWIDTH: 0.4 ms
MDC IDC SET LEADCHNL RV SENSING SENSITIVITY: 2.8 mV
MDC IDC STAT BRADY AP VS PERCENT: 75.45 %
MDC IDC STAT BRADY AS VP PERCENT: 0 %
MDC IDC STAT BRADY AS VS PERCENT: 24.49 %

## 2015-12-02 NOTE — Progress Notes (Signed)
Wound check appointment. Steri-strips removed. Wound without redness or edema. Incision edges approximated, wound well healed. Normal device function. Thresholds, sensing, and impedances consistent with implant measurements. Device programmed at 3.5V with auto capture programmed on for extra safety margin until 3 month visit. Histogram distribution appropriate for patient and level of activity. Patient reports weakness, exhaustion in AM only, feels it may be related to medication--scheduled for appt with AF clinic due to recent med changes in hospital. No mode switches (<0.1% AT/AF burden), +Xarelto, amio, and metoprolol. No high ventricular rates noted. Patient educated about wound care, arm mobility, lifting restrictions. ROV in 3 months with WC.

## 2015-12-03 ENCOUNTER — Encounter (HOSPITAL_COMMUNITY): Payer: Self-pay | Admitting: Nurse Practitioner

## 2015-12-03 ENCOUNTER — Ambulatory Visit (HOSPITAL_COMMUNITY)
Admission: RE | Admit: 2015-12-03 | Discharge: 2015-12-03 | Disposition: A | Discharge status: Home / Self Care | Payer: Medicare Other | Source: Ambulatory Visit | Attending: Nurse Practitioner | Admitting: Nurse Practitioner

## 2015-12-03 VITALS — BP 112/62 | HR 75 | Ht 65.0 in | Wt 124.2 lb

## 2015-12-03 DIAGNOSIS — Z950 Presence of cardiac pacemaker: Secondary | ICD-10-CM | POA: Insufficient documentation

## 2015-12-03 DIAGNOSIS — Z888 Allergy status to other drugs, medicaments and biological substances status: Secondary | ICD-10-CM | POA: Insufficient documentation

## 2015-12-03 DIAGNOSIS — E785 Hyperlipidemia, unspecified: Secondary | ICD-10-CM | POA: Insufficient documentation

## 2015-12-03 DIAGNOSIS — Z7901 Long term (current) use of anticoagulants: Secondary | ICD-10-CM | POA: Insufficient documentation

## 2015-12-03 DIAGNOSIS — N390 Urinary tract infection, site not specified: Secondary | ICD-10-CM | POA: Insufficient documentation

## 2015-12-03 DIAGNOSIS — R531 Weakness: Secondary | ICD-10-CM | POA: Insufficient documentation

## 2015-12-03 DIAGNOSIS — R5383 Other fatigue: Secondary | ICD-10-CM | POA: Insufficient documentation

## 2015-12-03 DIAGNOSIS — Z8719 Personal history of other diseases of the digestive system: Secondary | ICD-10-CM | POA: Diagnosis not present

## 2015-12-03 DIAGNOSIS — I480 Paroxysmal atrial fibrillation: Secondary | ICD-10-CM | POA: Diagnosis not present

## 2015-12-03 DIAGNOSIS — I4891 Unspecified atrial fibrillation: Secondary | ICD-10-CM | POA: Diagnosis not present

## 2015-12-03 DIAGNOSIS — Z79899 Other long term (current) drug therapy: Secondary | ICD-10-CM | POA: Insufficient documentation

## 2015-12-03 DIAGNOSIS — I10 Essential (primary) hypertension: Secondary | ICD-10-CM | POA: Diagnosis not present

## 2015-12-03 MED ORDER — LOSARTAN POTASSIUM 100 MG PO TABS: 50.0000 mg | ORAL_TABLET | Freq: Every day | ORAL | Status: DC

## 2015-12-03 NOTE — Progress Notes (Signed)
Patient ID: [name] Small, female   DOB: 1929-04-23, 82 y.o.   MRN: [id number]     Primary Care Physician: Nyoka Cowden, MD Referring Physician:Device clinic Cardiologist: Dr. Oris Drone is a 82 y.o. female with a h/o afib, with tacy/brady, admitted and underwent implantation of a  Medtronic PPM. She was monitored on telemetry overnight which demonstrated SR with A pacing, V sensing this AM. Left chest was without hematoma or ecchymosis. The device was interrogated and found to be functioning normally. CXR was obtained and demonstrated no pneumothorax status post device implantation. Wound care, arm mobility, and restrictions were reviewed with the patient. Multaq is being discontinued to start amiodarone. She had been prescribed Keflex for UTI at the ED 11/24/15 she will continue this as was prescribed to completion and instructed to f/u with her PMD regarding this. The patient was was examined by Dr. Curt Bears and considered stable for discharge to home.   She was seen in the device clinic yesterday with interrogation of device.She c/o of feeling weak. Device interrogated normal function and without any afib. Pacer site healing well. She was started on amiodarone 200 mg a day on d/c 7/7 and continues on cephalexin 500 mg tid for UTI. She did not start metoprolol. She c /o of feeling very weak in the am and better in the pm. BP soft at [id number] systolic and usually runs around 140 sys. Standing at 94/60. Has had some mild GI upset with antibiotic.  Today, she denies symptoms of palpitations, chest pain, shortness of breath, orthopnea, PND, lower extremity edema, dizziness, presyncope, syncope, or neurologic sequela. The patient is tolerating medications without difficulties and is otherwise without complaint today.   Past Medical History  Diagnosis Date  . DIVERTICULOSIS, COLON 11/22/2006  . HYPERLIPIDEMIA 11/22/2006  . OSTEOPOROSIS 11/22/2006  . Menopausal syndrome (hot flashes)   .  Cancer ([place]) Meloanoma left leg  . Hypertension   . Atrial fibrillation ([place])   . Presence of permanent cardiac pacemaker   . Bleeding stomach ulcer 1980s?  [name] History of blood transfusion 1980s    "when I had the bleeding ulcer"  . Headache(784.0)     "very often; not regular" (11/25/2015)   Past Surgical History  Procedure Laterality Date  . Bunionectomy Right   . Femur im nail Left 07/08/2014    Procedure: INTRAMEDULLARY (IM) NAIL ;  Surgeon: Meredith Pel, MD;  Location: [facility];  Service: Orthopedics;  Laterality: Left;  . Ep implantable device N/A 11/25/2015    Procedure: Pacemaker Implant;  Surgeon: Will Meredith Leeds, MD;  Location: [place] [facility];  Service: Cardiovascular;  Laterality: N/A;  . Fracture surgery    . Appendectomy    . Cholecystectomy open    . Cataract extraction w/ intraocular lens  implant, bilateral Bilateral 2000s    Current Outpatient Prescriptions  Medication Sig Dispense Refill  . acetaminophen (TYLENOL) 500 MG tablet Take 500 mg by mouth every 6 (six) hours as needed for mild pain or headache.     [name] amiodarone (PACERONE) 200 MG tablet Take 1 tablet (200 mg total) by mouth daily. 30 tablet 3  . atorvastatin (LIPITOR) 40 MG tablet TAKE 1 TABLET BY MOUTH DAILY 90 tablet 1  . Calcium Carbonate-Vitamin D (CALTRATE 600+D PO) Take 1 tablet by mouth daily.      . cephALEXin (KEFLEX) 500 MG capsule Take 1 capsule (500 mg total) by mouth 3 (three) times daily. 30 capsule 0  . losartan (  COZAAR) 100 MG tablet Take 0.5 tablets (50 mg total) by mouth at bedtime. 30 tablet 1  . Polyvinyl Alcohol-Povidone (REFRESH OP) Place 1 drop into both eyes 2 (two) times daily.    . Rivaroxaban (XARELTO) 15 MG TABS tablet Take 1 tablet (15 mg total) by mouth daily with supper. 30 tablet 11  . [DISCONTINUED] dronedarone (MULTAQ) 400 MG tablet Take 1 tablet (400 mg total) by mouth daily.     No current facility-administered medications for this encounter.    Allergies    Allergen Reactions  . Lisinopril Other (See Comments)    Cough    Social History   Social History  . Marital Status: Widowed    Spouse Name: N/A  . Number of Children: N/A  . Years of Education: N/A   Occupational History  . Not on file.   Social History Main Topics  . Smoking status: Never Smoker   . Smokeless tobacco: Never Used  . Alcohol Use: No  . Drug Use: No  . Sexual Activity: No   Other Topics Concern  . Not on file   Social History Narrative    Family History  Problem Relation Age of Onset  . Alzheimer's disease Sister   . Cardiomyopathy Brother     ROS- All systems are reviewed and negative except as per the HPI above  Physical Exam: Filed Vitals:   12/03/15 1033  BP: 112/62  Pulse: 75  Height: 5\' 5"  (1.651 m)  Weight: 124 lb 3.2 oz (56.337 kg)    GEN- The patient is well appearing, alert and oriented x 3 today.   Head- normocephalic, atraumatic Eyes-  Sclera clear, conjunctiva pink Ears- hearing intact Oropharynx- clear Neck- supple, no JVP Lymph- no cervical lymphadenopathy Lungs- Clear to ausculation bilaterally, normal work of breathing Heart-Slighly irregular(pc's) rate and rhythm, no murmurs, rubs or gallops, PMI not laterally displaced GI- soft, NT, ND, + BS Extremities- no clubbing, cyanosis, or edema MS- no significant deformity or atrophy Skin- no rash or lesion Psych- euthymic mood, full affect Neuro- strength and sensation are intact  EKG-atrial paced rhythm with bigeminy PAC's pr int 162 ms, qrs int 88 ms, qtc 471 ms Epic records reviewed  Assessment and Plan: 1. Afib, s/p PPM On amiodarone 200 mg a day and by interrogation appears to be in SR. Hold on staring metoprolol since she has fatigue and BP soft  2. Weakness/fatigue  Think that BP is playing a role in this Cut losartan to 50 mg and move to bedtime Drink an extra bottle of water a day, currently only drinking 2 16 oz bottles of water a day  3. UTI Finish  antibiotics, has three more days F/u with pcp  F/u in afib clinic on Wednesday  [name] C. [name], [facility] [address] [place], [place] [address] [phone]

## 2015-12-03 NOTE — Patient Instructions (Signed)
Your physician has recommended you make the following change in your medication: Decrease Losartan 100 mg to 1/2 tablet and take this at bedtime    Increase your water intake to add 16oz of water extra  Recheck on Wednesday

## 2015-12-08 ENCOUNTER — Encounter (HOSPITAL_COMMUNITY): Payer: Self-pay | Admitting: Nurse Practitioner

## 2015-12-08 ENCOUNTER — Ambulatory Visit (HOSPITAL_COMMUNITY)
Admission: RE | Admit: 2015-12-08 | Discharge: 2015-12-08 | Disposition: A | Discharge status: Home / Self Care | Payer: Medicare Other | Source: Ambulatory Visit | Attending: Nurse Practitioner | Admitting: Nurse Practitioner

## 2015-12-08 VITALS — BP 132/64 | HR 63 | Ht 65.0 in | Wt 124.0 lb

## 2015-12-08 DIAGNOSIS — N390 Urinary tract infection, site not specified: Secondary | ICD-10-CM | POA: Insufficient documentation

## 2015-12-08 DIAGNOSIS — Z950 Presence of cardiac pacemaker: Secondary | ICD-10-CM | POA: Insufficient documentation

## 2015-12-08 DIAGNOSIS — Z7901 Long term (current) use of anticoagulants: Secondary | ICD-10-CM | POA: Diagnosis not present

## 2015-12-08 DIAGNOSIS — Z79899 Other long term (current) drug therapy: Secondary | ICD-10-CM | POA: Diagnosis not present

## 2015-12-08 DIAGNOSIS — I10 Essential (primary) hypertension: Secondary | ICD-10-CM | POA: Insufficient documentation

## 2015-12-08 DIAGNOSIS — R9431 Abnormal electrocardiogram [ECG] [EKG]: Secondary | ICD-10-CM | POA: Diagnosis not present

## 2015-12-08 DIAGNOSIS — E785 Hyperlipidemia, unspecified: Secondary | ICD-10-CM | POA: Insufficient documentation

## 2015-12-08 DIAGNOSIS — I4891 Unspecified atrial fibrillation: Secondary | ICD-10-CM | POA: Insufficient documentation

## 2015-12-08 DIAGNOSIS — R531 Weakness: Secondary | ICD-10-CM | POA: Insufficient documentation

## 2015-12-08 DIAGNOSIS — I452 Bifascicular block: Secondary | ICD-10-CM | POA: Insufficient documentation

## 2015-12-08 NOTE — Progress Notes (Signed)
Patient ID: [name] Small, female   DOB: 05-30-1928, 82 y.o.   MRN: [id number]     Primary Care Physician: Nyoka Cowden, MD Referring Physician:Device clinic Cardiologist: Dr. Oris Drone is a 82 y.o. female with a h/o afib, with tacy/brady, admitted and underwent implantation of a  Medtronic PPM. She was monitored on telemetry overnight which demonstrated SR with A pacing, V sensing this AM. Left chest was without hematoma or ecchymosis. The device was interrogated and found to be functioning normally. CXR was obtained and demonstrated no pneumothorax status post device implantation. Wound care, arm mobility, and restrictions were reviewed with the patient. Multaq was discontinued to start amiodarone. She had been prescribed Keflex for UTI at the ED 11/24/15 she will continue this as was prescribed to completion and instructed to f/u with her PMD regarding this. The patient was was examined by Dr. Curt Bears and considered stable for discharge to home.   She was seen in the device clinic yesterday with interrogation of device.She had c/o's of feeling weak. Device interrogated normal function and without any afib. Pacer site healing well. She was started on amiodarone 200 mg a day on d/c 7/7 and continues on cephalexin 500 mg tid for UTI. She did not start metoprolol. She c /o of feeling very weak in the am and better in the pm. BP soft at [id number] systolic and usually runs around 140 sys. Standing at 94/60. Has had some mild GI upset with antibiotic.  Losartan was cut in half and moved to bedtime and she was asked to drink more water with orthostatic BP. She was told to hold off on start of metoprolol.  Returns 7/19 and feels much better. BP better at [id number] systolic. Can now due activities around the house without feeling like she may pass out. Continues on amiodarone and has not noticed any afib. Continues with L arm restrictions.  Today, she denies symptoms of palpitations, chest  pain, shortness of breath, orthopnea, PND, lower extremity edema, dizziness, presyncope, syncope, or neurologic sequela. The patient is tolerating medications without difficulties and is otherwise without complaint today.   Past Medical History  Diagnosis Date  . DIVERTICULOSIS, COLON 11/22/2006  . HYPERLIPIDEMIA 11/22/2006  . OSTEOPOROSIS 11/22/2006  . Menopausal syndrome (hot flashes)   . Cancer ([place]) Meloanoma left leg  . Hypertension   . Atrial fibrillation ([place])   . Presence of permanent cardiac pacemaker   . Bleeding stomach ulcer 1980s?  [name] History of blood transfusion 1980s    "when I had the bleeding ulcer"  . Headache(784.0)     "very often; not regular" (11/25/2015)   Past Surgical History  Procedure Laterality Date  . Bunionectomy Right   . Femur im nail Left 07/08/2014    Procedure: INTRAMEDULLARY (IM) NAIL ;  Surgeon: Meredith Pel, MD;  Location: [facility];  Service: Orthopedics;  Laterality: Left;  . Ep implantable device N/A 11/25/2015    Procedure: Pacemaker Implant;  Surgeon: Will Meredith Leeds, MD;  Location: [place] [facility];  Service: Cardiovascular;  Laterality: N/A;  . Fracture surgery    . Appendectomy    . Cholecystectomy open    . Cataract extraction w/ intraocular lens  implant, bilateral Bilateral 2000s    Current Outpatient Prescriptions  Medication Sig Dispense Refill  . acetaminophen (TYLENOL) 500 MG tablet Take 500 mg by mouth every 6 (six) hours as needed for mild pain or headache.     [name] amiodarone (PACERONE) 200 MG tablet  Take 1 tablet (200 mg total) by mouth daily. 30 tablet 3  . atorvastatin (LIPITOR) 40 MG tablet TAKE 1 TABLET BY MOUTH DAILY 90 tablet 1  . Calcium Carbonate-Vitamin D (CALTRATE 600+D PO) Take 1 tablet by mouth daily.      [name] losartan (COZAAR) 100 MG tablet Take 0.5 tablets (50 mg total) by mouth at bedtime. 30 tablet 1  . Polyvinyl Alcohol-Povidone (REFRESH OP) Place 1 drop into both eyes 2 (two) times daily.    . Rivaroxaban  (XARELTO) 15 MG TABS tablet Take 1 tablet (15 mg total) by mouth daily with supper. 30 tablet 11  . [DISCONTINUED] dronedarone (MULTAQ) 400 MG tablet Take 1 tablet (400 mg total) by mouth daily.     No current facility-administered medications for this encounter.    Allergies  Allergen Reactions  . Lisinopril Other (See Comments)    Cough    Social History   Social History  . Marital Status: Widowed    Spouse Name: N/A  . Number of Children: N/A  . Years of Education: N/A   Occupational History  . Not on file.   Social History Main Topics  . Smoking status: Never Smoker   . Smokeless tobacco: Never Used  . Alcohol Use: No  . Drug Use: No  . Sexual Activity: No   Other Topics Concern  . Not on file   Social History Narrative    Family History  Problem Relation Age of Onset  . Alzheimer's disease Sister   . Cardiomyopathy Brother     ROS- All systems are reviewed and negative except as per the HPI above  Physical Exam: Filed Vitals:   12/08/15 0920  BP: 132/64  Pulse: 63  Height: 5\' 5"  (1.651 m)  Weight: 124 lb (56.246 kg)    GEN- The patient is well appearing, alert and oriented x 3 today.   Head- normocephalic, atraumatic Eyes-  Sclera clear, conjunctiva pink Ears- hearing intact Oropharynx- clear Neck- supple, no JVP Lymph- no cervical lymphadenopathy Lungs- Clear to ausculation bilaterally, normal work of breathing Heart-regular  rate and rhythm, no murmurs, rubs or gallops, PMI not laterally displaced. PPM site healed GI- soft, NT, ND, + BS Extremities- no clubbing, cyanosis, or edema MS- no significant deformity or atrophy Skin- no rash or lesion Psych- euthymic mood, full affect Neuro- strength and sensation are intact  EKG-atrial paced rhythm, IRBBB,  pr int 182 ms, qrs int 94 ms, qtc 425 ms Epic records reviewed  Assessment and Plan: 1. Afib, s/p PPM Continue amiodarone 200 mg a day Continue xarelto for chadsvasc score of at least  4 Continues with left arm restrictions  2. Weakness/fatigue  Resolved with reduction of Losartan and moving to bedtime Continue an extra bottle of water a day, currently only drinking 2 16 oz bottles of water a day  3. UTI Finished antibiotics  F/u with pcp   F/u in afib clinic as needed Dr. Curt Bears as scheduled  Geroge Baseman. Shalawn Wynder, [facility] [address] [place], [place] [address] [phone]

## 2015-12-14 ENCOUNTER — Telehealth (HOSPITAL_COMMUNITY): Payer: Self-pay | Admitting: *Deleted

## 2015-12-14 NOTE — Telephone Encounter (Signed)
Pt called reporting that she is still having low bps.  This morning it was 88/60 and that she is still feeling weak.  Per Roderic Palau, NP pt should discontinue the Losartan that was already cut in half on last visit and continue to monitor her blood pressure..  Call our office if it begins to rise.  Pt understood all directions.

## 2015-12-15 ENCOUNTER — Encounter: Payer: Medicare Other | Admitting: Cardiology

## 2015-12-22 ENCOUNTER — Ambulatory Visit (INDEPENDENT_AMBULATORY_CARE_PROVIDER_SITE_OTHER): Payer: Medicare Other | Admitting: Internal Medicine

## 2015-12-22 ENCOUNTER — Encounter: Payer: Self-pay | Admitting: Internal Medicine

## 2015-12-22 VITALS — BP 158/78 | HR 67 | Temp 97.8°F | Wt 125.8 lb

## 2015-12-22 DIAGNOSIS — I10 Essential (primary) hypertension: Secondary | ICD-10-CM | POA: Diagnosis not present

## 2015-12-22 DIAGNOSIS — I4891 Unspecified atrial fibrillation: Secondary | ICD-10-CM | POA: Diagnosis not present

## 2015-12-22 DIAGNOSIS — G44209 Tension-type headache, unspecified, not intractable: Secondary | ICD-10-CM | POA: Diagnosis not present

## 2015-12-22 DIAGNOSIS — N390 Urinary tract infection, site not specified: Secondary | ICD-10-CM

## 2015-12-22 LAB — POC URINALSYSI DIPSTICK (AUTOMATED)
BILIRUBIN UA: NEGATIVE
Glucose, UA: NEGATIVE
KETONES UA: NEGATIVE
NITRITE UA: NEGATIVE
PH UA: 5
Protein, UA: NEGATIVE
RBC UA: NEGATIVE
Spec Grav, UA: 1.025
Urobilinogen, UA: 0.2

## 2015-12-22 MED ORDER — TRAMADOL HCL 50 MG PO TABS: 50.0000 mg | ORAL_TABLET | Freq: Four times a day (QID) | ORAL | 0 refills | Status: DC | PRN

## 2015-12-22 NOTE — Progress Notes (Signed)
Subjective:    Patient ID: [name] Small, female    DOB: 10-22-1928, 82 y.o.   MRN: [id number]  HPI  82 year old patient who is seen today with a chief complaint of headaches.  This started 3-4 days ago and are described as a throbbing headache in the left occipital area.  They are paroxysmal. She was hospitalized recently for atrial fibrillation and is status post pacemaker insertion.  She states the hospital course was complicated by a UTI and yesterday has resumed cephalexin.  No fever or chills.  No history of falls or head trauma.  No focal neurological symptoms.  Past Medical History:  Diagnosis Date  . Atrial fibrillation ([place])   . Bleeding stomach ulcer 1980s?  [name] Cancer ([place]) Meloanoma left leg  . DIVERTICULOSIS, COLON 11/22/2006  . Headache(784.0)    "very often; not regular" (11/25/2015)  . History of blood transfusion 1980s   "when I had the bleeding ulcer"  . HYPERLIPIDEMIA 11/22/2006  . Hypertension   . Menopausal syndrome (hot flashes)   . OSTEOPOROSIS 11/22/2006  . Presence of permanent cardiac pacemaker      Social History   Social History  . Marital status: Widowed    Spouse name: N/A  . Number of children: N/A  . Years of education: N/A   Occupational History  . Not on file.   Social History Main Topics  . Smoking status: Never Smoker  . Smokeless tobacco: Never Used  . Alcohol use No  . Drug use: No  . Sexual activity: No   Other Topics Concern  . Not on file   Social History Narrative  . No narrative on file    Past Surgical History:  Procedure Laterality Date  . APPENDECTOMY    . BUNIONECTOMY Right   . CATARACT EXTRACTION W/ INTRAOCULAR LENS  IMPLANT, BILATERAL Bilateral 2000s  . CHOLECYSTECTOMY OPEN    . EP IMPLANTABLE DEVICE N/A 11/25/2015   Procedure: Pacemaker Implant;  Surgeon: Will Meredith Leeds, MD;  Location: [facility];  Service: Cardiovascular;  Laterality: N/A;  . FEMUR IM NAIL Left 07/08/2014   Procedure: INTRAMEDULLARY (IM)  NAIL ;  Surgeon: Meredith Pel, MD;  Location: [facility];  Service: Orthopedics;  Laterality: Left;  . FRACTURE SURGERY      Family History  Problem Relation Age of Onset  . Alzheimer's disease Sister   . Cardiomyopathy Brother     Allergies  Allergen Reactions  . Lisinopril Other (See Comments)    Cough    Current Outpatient Prescriptions on File Prior to Visit  Medication Sig Dispense Refill  . acetaminophen (TYLENOL) 500 MG tablet Take 500 mg by mouth every 6 (six) hours as needed for mild pain or headache.     [name] amiodarone (PACERONE) 200 MG tablet Take 1 tablet (200 mg total) by mouth daily. 30 tablet 3  . atorvastatin (LIPITOR) 40 MG tablet TAKE 1 TABLET BY MOUTH DAILY 90 tablet 1  . Calcium Carbonate-Vitamin D (CALTRATE 600+D PO) Take 1 tablet by mouth daily.      . Polyvinyl Alcohol-Povidone (REFRESH OP) Place 1 drop into both eyes 2 (two) times daily.    . Rivaroxaban (XARELTO) 15 MG TABS tablet Take 1 tablet (15 mg total) by mouth daily with supper. 30 tablet 11  . [DISCONTINUED] dronedarone (MULTAQ) 400 MG tablet Take 1 tablet (400 mg total) by mouth daily.     No current facility-administered medications on file prior to visit.     BP [name])  158/78 (BP Location: Left Arm, Patient Position: Sitting, Cuff Size: Normal)   Pulse 67   Temp 97.8 F (36.6 C) (Oral)   Wt 125 lb 12.8 oz (57.1 kg)   SpO2 98%   BMI 20.93 kg/m     Review of Systems  Constitutional: Negative.   Respiratory: Negative.   Genitourinary: Positive for dysuria and frequency.  Neurological: Positive for headaches.       Objective:   Physical Exam  Constitutional: She is oriented to person, place, and time. She appears well-developed and well-nourished.  HENT:  Head: Normocephalic.  Right Ear: External ear normal.  Left Ear: External ear normal.  Mouth/Throat: Oropharynx is clear and moist.  Eyes: Conjunctivae and EOM are normal. Pupils are equal, round, and reactive to light.  Neck:  Normal range of motion. Neck supple. No thyromegaly present.  Cardiovascular: Normal rate, regular rhythm, normal heart sounds and intact distal pulses.   Pulmonary/Chest: Effort normal and breath sounds normal.  Abdominal: Soft. Bowel sounds are normal. She exhibits no mass. There is no tenderness.  Musculoskeletal: Normal range of motion.  Lymphadenopathy:    She has no cervical adenopathy.  Neurological: She is alert and oriented to person, place, and time. She has normal reflexes. No cranial nerve deficit.  No drift Normal finger to nose testing  Skin: Skin is warm and dry. No rash noted.  Psychiatric: She has a normal mood and affect. Her behavior is normal.          Assessment & Plan:   Headaches.  Unremarkable exam.  No headache at the present time.  These have been refractory to acetaminophen and occasionally interfere with sleep.  Will give a prescription for tramadol to take when necessary if needed Atrial fibrillation, status post PPM UTI.  Patient will complete antibiotic therapy with cephalexin  Cardiology follow-up as scheduled  Nyoka Cowden, MD

## 2015-12-22 NOTE — Patient Instructions (Signed)
Limit your sodium (Salt) intake  Call or return to clinic prn if these symptoms worsen or fail to improve as anticipated.  Take your antibiotic as prescribed until ALL of it is gone, but stop if you develop a rash, swelling, or any side effects of the medication.  Contact our office as soon as possible if  there are side effects of the medication.

## 2015-12-22 NOTE — Progress Notes (Signed)
Pre visit review using our clinic review tool, if applicable. No additional management support is needed unless otherwise documented below in the visit note. 

## 2015-12-27 ENCOUNTER — Emergency Department (HOSPITAL_COMMUNITY)
Admission: EM | Admit: 2015-12-27 | Discharge: 2015-12-27 | Disposition: A | Discharge status: Home / Self Care | Payer: Medicare Other | Attending: Emergency Medicine | Admitting: Emergency Medicine

## 2015-12-27 ENCOUNTER — Encounter (HOSPITAL_COMMUNITY): Payer: Self-pay | Admitting: Emergency Medicine

## 2015-12-27 ENCOUNTER — Emergency Department (HOSPITAL_COMMUNITY): Payer: Medicare Other

## 2015-12-27 ENCOUNTER — Other Ambulatory Visit: Payer: Self-pay | Admitting: Internal Medicine

## 2015-12-27 ENCOUNTER — Telehealth (HOSPITAL_COMMUNITY): Payer: Self-pay | Admitting: *Deleted

## 2015-12-27 DIAGNOSIS — Z79899 Other long term (current) drug therapy: Secondary | ICD-10-CM | POA: Diagnosis not present

## 2015-12-27 DIAGNOSIS — M5481 Occipital neuralgia: Secondary | ICD-10-CM | POA: Diagnosis not present

## 2015-12-27 DIAGNOSIS — R51 Headache: Principal | ICD-10-CM

## 2015-12-27 DIAGNOSIS — I4891 Unspecified atrial fibrillation: Secondary | ICD-10-CM | POA: Diagnosis not present

## 2015-12-27 DIAGNOSIS — I10 Essential (primary) hypertension: Secondary | ICD-10-CM | POA: Diagnosis not present

## 2015-12-27 DIAGNOSIS — Z7901 Long term (current) use of anticoagulants: Secondary | ICD-10-CM | POA: Insufficient documentation

## 2015-12-27 DIAGNOSIS — R519 Headache, unspecified: Secondary | ICD-10-CM

## 2015-12-27 DIAGNOSIS — I6522 Occlusion and stenosis of left carotid artery: Secondary | ICD-10-CM | POA: Diagnosis not present

## 2015-12-27 DIAGNOSIS — Z950 Presence of cardiac pacemaker: Secondary | ICD-10-CM | POA: Insufficient documentation

## 2015-12-27 DIAGNOSIS — I7774 Dissection of vertebral artery: Secondary | ICD-10-CM

## 2015-12-27 LAB — BASIC METABOLIC PANEL
Anion gap: 6 (ref 5–15)
BUN: 12 mg/dL (ref 6–20)
CHLORIDE: 105 mmol/L (ref 101–111)
CO2: 28 mmol/L (ref 22–32)
CREATININE: 1.19 mg/dL — AB (ref 0.44–1.00)
Calcium: 9.8 mg/dL (ref 8.9–10.3)
GFR, EST AFRICAN AMERICAN: 47 mL/min — AB (ref >60)
GFR, EST NON AFRICAN AMERICAN: 41 mL/min — AB (ref >60)
Glucose, Bld: 97 mg/dL (ref 65–99)
POTASSIUM: 4.6 mmol/L (ref 3.5–5.1)
SODIUM: 139 mmol/L (ref 135–145)

## 2015-12-27 LAB — CBC WITH DIFFERENTIAL/PLATELET
BASOS PCT: 0 %
Basophils Absolute: 0 10*3/uL (ref 0.0–0.1)
EOS ABS: 0.1 10*3/uL (ref 0.0–0.7)
EOS PCT: 2 %
HCT: 42.5 % (ref 36.0–46.0)
HEMOGLOBIN: 13.3 g/dL (ref 12.0–15.0)
Lymphocytes Relative: 30 %
Lymphs Abs: 1.7 10*3/uL (ref 0.7–4.0)
MCH: 28.8 pg (ref 26.0–34.0)
MCHC: 31.3 g/dL (ref 30.0–36.0)
MCV: 92 fL (ref 78.0–100.0)
Monocytes Absolute: 0.5 10*3/uL (ref 0.1–1.0)
Monocytes Relative: 8 %
NEUTROS PCT: 60 %
Neutro Abs: 3.5 10*3/uL (ref 1.7–7.7)
PLATELETS: 216 10*3/uL (ref 150–400)
RBC: 4.62 MIL/uL (ref 3.87–5.11)
RDW: 13.5 % (ref 11.5–15.5)
WBC: 5.9 10*3/uL (ref 4.0–10.5)

## 2015-12-27 LAB — I-STAT CHEM 8, ED
BUN: 14 mg/dL (ref 6–20)
CHLORIDE: 102 mmol/L (ref 101–111)
Calcium, Ion: 1.22 mmol/L (ref 1.12–1.23)
Creatinine, Ser: 1.1 mg/dL — ABNORMAL HIGH (ref 0.44–1.00)
Glucose, Bld: 91 mg/dL (ref 65–99)
HEMATOCRIT: 42 % (ref 36.0–46.0)
Hemoglobin: 14.3 g/dL (ref 12.0–15.0)
Potassium: 4.6 mmol/L (ref 3.5–5.1)
SODIUM: 141 mmol/L (ref 135–145)
TCO2: 26 mmol/L (ref 0–100)

## 2015-12-27 LAB — SEDIMENTATION RATE: SED RATE: 10 mm/h (ref 0–22)

## 2015-12-27 MED ORDER — GABAPENTIN 100 MG PO CAPS
100.0000 mg | ORAL_CAPSULE | Freq: Once | ORAL | Status: AC
  Administered 2015-12-27: 100 mg via ORAL
  Filled 2015-12-27: qty 1

## 2015-12-27 MED ORDER — IOPAMIDOL (ISOVUE-370) INJECTION 76%
INTRAVENOUS | Status: AC
  Administered 2015-12-27: 50 mL
  Filled 2015-12-27: qty 50

## 2015-12-27 MED ORDER — GABAPENTIN 100 MG PO CAPS: 100.0000 mg | ORAL_CAPSULE | Freq: Two times a day (BID) | ORAL | 0 refills | Status: DC

## 2015-12-27 MED ORDER — ACETAMINOPHEN 325 MG PO TABS
650.0000 mg | ORAL_TABLET | Freq: Once | ORAL | Status: AC
  Administered 2015-12-27: 325 mg via ORAL
  Filled 2015-12-27: qty 2

## 2015-12-27 NOTE — ED Triage Notes (Addendum)
Patient coming from home with c/o of posterior left head pain and right arm (elbow up to the shoulder) pain.  Patient states the pain has been having intermittent pain x 1 week.  Patient was seen by PCP on Wednesday 8/2 and prescribed Tramadol. Pain is currently rated a10/10 in head and arm.  Patient denies chest pain, no N/V/D.

## 2015-12-27 NOTE — Discharge Instructions (Signed)
Takes Neurontin a.m. and p.m. as described.  Follow-up with Dr. Burnice Logan. He is planning a neurology appointment referral.

## 2015-12-27 NOTE — ED Provider Notes (Signed)
[facility] DEPT Provider Note   CSN: [id number] Arrival date & time: 12/27/15  [id number]  First Provider Contact:  First MD Initiated Contact with Patient 12/27/15 1012        History   Chief Complaint Chief Complaint  Patient presents with  . Migraine  . Arm Pain    right arm from the elbow to the shoulder    HPI [name] Small is a 82 y.o. female. Her PCP is Dr. Sindy Guadeloupe at [name].  She presents with an occipital headache and right arm pain. These are new problems for her last week. She saw her physician Dr. Lonni Fix on Thursday. Was offered tramadol. She states her headaches continue. She states the Tylenol, all will help for a short while but they come back  Described as a throbbing left-sided headache. It is somewhat paroxysmal. Not a thunderclap-type event but will come on a few hours later seemingly go away sometimes with or without medications. She has a right arm pain sometimes at the same times her headache, sometimes not. His history of A. fib and is on Xarelto. Has not had any sudden or severe episodes of pain. No difficulty with speech. No difficulty with use or movement of 4 extremities ie no stroke symptoms.   HPI  Past Medical History:  Diagnosis Date  . Atrial fibrillation ([place])   . Bleeding stomach ulcer 1980s?  [name] Cancer ([place]) Meloanoma left leg  . DIVERTICULOSIS, COLON 11/22/2006  . Headache(784.0)    "very often; not regular" (11/25/2015)  . History of blood transfusion 1980s   "when I had the bleeding ulcer"  . HYPERLIPIDEMIA 11/22/2006  . Hypertension   . Menopausal syndrome (hot flashes)   . OSTEOPOROSIS 11/22/2006  . Presence of permanent cardiac pacemaker     Patient Active Problem List   Diagnosis Date Noted  . AF (atrial fibrillation) ([place]) 11/25/2015  . Atrial fibrillation ([place])   . Troponin level elevated 12/08/2014  . Hyperglycemia 12/07/2014  . Atrial fibrillation with RVR-CHADs VASc=4 12/07/2014  .  Intertrochanteric fracture of left hip ([place]) 07/08/2014  . Fall   . Overactive bladder 04/02/2012  . Essential hypertension 04/02/2012  . Chest pain, atypical 09/17/2011  . Dehydration 09/16/2011  . Palpitations 09/16/2011  . Dyslipidemia 11/22/2006  . DIVERTICULOSIS, COLON 11/22/2006  . Osteoporosis 11/22/2006    Past Surgical History:  Procedure Laterality Date  . APPENDECTOMY    . BUNIONECTOMY Right   . CATARACT EXTRACTION W/ INTRAOCULAR LENS  IMPLANT, BILATERAL Bilateral 2000s  . CHOLECYSTECTOMY OPEN    . EP IMPLANTABLE DEVICE N/A 11/25/2015   Procedure: Pacemaker Implant;  Surgeon: Will Meredith Leeds, MD;  Location: [facility];  Service: Cardiovascular;  Laterality: N/A;  . FEMUR IM NAIL Left 07/08/2014   Procedure: INTRAMEDULLARY (IM) NAIL ;  Surgeon: Meredith Pel, MD;  Location: [facility];  Service: Orthopedics;  Laterality: Left;  . FRACTURE SURGERY      OB History    No data available       Home Medications    Prior to Admission medications   Medication Sig Start Date End Date Taking? Authorizing Provider  acetaminophen (TYLENOL) 500 MG tablet Take 500 mg by mouth every 6 (six) hours as needed for mild pain or headache.    Yes Historical Provider, MD  amiodarone (PACERONE) 200 MG tablet Take 1 tablet (200 mg total) by mouth daily. 11/26/15  Yes Renee Dyane Dustman, PA-C  atorvastatin (LIPITOR) 40 MG tablet  TAKE 1 TABLET BY MOUTH DAILY Patient taking differently: TAKE 40 MG BY MOUTH DAILY 06/30/15  Yes Marletta Lor, MD  Calcium Carbonate-Vitamin D (CALTRATE 600+D PO) Take 1 tablet by mouth daily.     Yes Historical Provider, MD  Polyvinyl Alcohol-Povidone (REFRESH OP) Place 1 drop into both eyes 2 (two) times daily.   Yes Historical Provider, MD  Rivaroxaban (XARELTO) 15 MG TABS tablet Take 1 tablet (15 mg total) by mouth daily with supper. 12/16/14  Yes Josue Hector, MD  traMADol (ULTRAM) 50 MG tablet Take 1 tablet (50 mg total) by mouth every 6 (six)  hours as needed. Patient taking differently: Take 50 mg by mouth every 6 (six) hours as needed for moderate pain.  12/22/15  Yes Marletta Lor, MD  gabapentin (NEURONTIN) 100 MG capsule Take 1 capsule (100 mg total) by mouth 2 (two) times daily. 12/27/15   Tanna Furry, MD    Family History Family History  Problem Relation Age of Onset  . Alzheimer's disease Sister   . Cardiomyopathy Brother     Social History Social History  Substance Use Topics  . Smoking status: Never Smoker  . Smokeless tobacco: Never Used  . Alcohol use No     Allergies   Lisinopril   Review of Systems Review of Systems  Constitutional: Negative for appetite change, chills, diaphoresis, fatigue and fever.  HENT: Negative for mouth sores, sore throat and trouble swallowing.   Eyes: Negative for visual disturbance.  Respiratory: Negative for cough, chest tightness, shortness of breath and wheezing.   Cardiovascular: Negative for chest pain.  Gastrointestinal: Negative for abdominal distention, abdominal pain, diarrhea, nausea and vomiting.  Endocrine: Negative for polydipsia, polyphagia and polyuria.  Genitourinary: Negative for dysuria, frequency and hematuria.  Musculoskeletal: Positive for myalgias. Negative for gait problem.  Skin: Negative for color change, pallor and rash.  Neurological: Positive for headaches. Negative for dizziness, syncope and light-headedness.  Hematological: Does not bruise/bleed easily.  Psychiatric/Behavioral: Negative for behavioral problems and confusion.     Physical Exam Updated Vital Signs BP 182/73   Pulse (!) 59   Temp 97.7 F (36.5 C) (Oral)   Resp 19   Ht 5\' 5"  (1.651 m)   Wt 126 lb (57.2 kg)   SpO2 100%   BMI 20.97 kg/m   Physical Exam  Neurological:  Normal cranial nerve exam. Normal exam of the scalp and skull. No reducible tenderness per no vesicles. Does not reproduce with palpation or manipulation of the suboccipital area to suggest neuritis.  Not tender over the temporal artery. Intact cranial nerves and symmetric strength and motor   Normal exam of rue   ED Treatments / Results  Labs (all labs ordered are listed, but only abnormal results are displayed) Labs Reviewed  BASIC METABOLIC PANEL - Abnormal; Notable for the following:       Result Value   Creatinine, Ser 1.19 (*)    GFR calc non Af Amer 41 (*)    GFR calc Af Amer 47 (*)    All other components within normal limits  I-STAT CHEM 8, ED - Abnormal; Notable for the following:    Creatinine, Ser 1.10 (*)    All other components within normal limits  CBC WITH DIFFERENTIAL/PLATELET  SEDIMENTATION RATE    EKG  EKG Interpretation None       Radiology Ct Angio Head W Or Wo Contrast  Result Date: 12/27/2015 CLINICAL DATA:  Vertebral artery dissection. Left posterior occipital region pain.  EXAM: CT ANGIOGRAPHY HEAD AND NECK TECHNIQUE: Multidetector CT imaging of the head and neck was performed using the standard protocol during bolus administration of intravenous contrast. Multiplanar CT image reconstructions and MIPs were obtained to evaluate the vascular anatomy. Carotid stenosis measurements (when applicable) are obtained utilizing NASCET criteria, using the distal internal carotid diameter as the denominator. CONTRAST:  50 cc Isovue 370 intravenous COMPARISON:  Head CT from earlier today FINDINGS: CTA NECK Aortic arch: Atheromatous changes. Three vessel branching. No acute finding. Bulbous appearance at the ligament arteriosum is likely a ductus bump. Right carotid system: Moderate mixed calcified and noncalcified plaque at the carotid bifurcation without flow limiting stenosis. No dissection or ulceration. Left carotid system: Moderate mixed density plaque at the bifurcation with ~50% proximal ICA stenosis. No dissection or plaque ulceration. Vertebral arteries:No proximal subclavian artery stenosis. Left dominant vertebral artery. Atheromatous changes without dissection  or flow limiting stenosis in the neck. Skeleton: No acute or aggressive process. Other neck: No incidental mass or adenopathy Upper chest: No acute finding. CTA HEAD Anterior circulation: Symmetric carotid arteries. Sizable right posterior communicating artery. Probable small anterior communicating artery. Carotid siphon atherosclerosis without stenosis. Scattered atheromatous narrowings including a moderate distal left M1 segment stenosis. Other narrowings are M2 level or beyond. Negative for aneurysm. Posterior circulation: Dominant on the left. Most of right vertebral artery flow is into the right PICA with minimal if any flow within the distal right V4 segment. Small basilar artery in the setting of large right posterior communicating artery. There is multi focal atheromatous irregularity of the bilateral posterior cerebral arteries with moderate left P2 and advanced left P3/4 segment stenoses. Venous sinuses: Patent Anatomic variants: Fetal type PCA on the right Delayed phase: Negative for mass or parenchymal enhancement. IMPRESSION: 1. No acute finding including vertebral dissection. 2. Cervical carotid atherosclerosis with approximately 50% proximal left ICA stenosis. 3. Intracranial atherosclerosis, most notably moderate left M1 and left proximal PCA stenoses. Electronically Signed   By: Monte Fantasia M.D.   On: 12/27/2015 13:10   Ct Head Wo Contrast  Result Date: 12/27/2015 CLINICAL DATA:  Left occipital pain for 2 weeks EXAM: CT HEAD WITHOUT CONTRAST TECHNIQUE: Contiguous axial images were obtained from the base of the skull through the vertex without intravenous contrast. COMPARISON:  None FINDINGS: Brain: There is low attenuation within the subcortical and periventricular white matter compatible with chronic microvascular disease. There is no abnormal extra-axial fluid collection, intracranial hemorrhage or mass identified. Vascular: No hyperdense vessel or unexpected calcification. Skull: The  paranasal sinuses and mastoid air cells are clear. The calvarium is intact. Sinuses/Orbits: No acute findings. Other: None. IMPRESSION: 1. No acute intracranial abnormalities. 2. Chronic microvascular disease and brain atrophy. Electronically Signed   By: Kerby Moors M.D.   On: 12/27/2015 12:44   Ct Angio Neck W Or Wo Contrast  Result Date: 12/27/2015 CLINICAL DATA:  Vertebral artery dissection. Left posterior occipital region pain. EXAM: CT ANGIOGRAPHY HEAD AND NECK TECHNIQUE: Multidetector CT imaging of the head and neck was performed using the standard protocol during bolus administration of intravenous contrast. Multiplanar CT image reconstructions and MIPs were obtained to evaluate the vascular anatomy. Carotid stenosis measurements (when applicable) are obtained utilizing NASCET criteria, using the distal internal carotid diameter as the denominator. CONTRAST:  50 cc Isovue 370 intravenous COMPARISON:  Head CT from earlier today FINDINGS: CTA NECK Aortic arch: Atheromatous changes. Three vessel branching. No acute finding. Bulbous appearance at the ligament arteriosum is likely a ductus bump. Right  carotid system: Moderate mixed calcified and noncalcified plaque at the carotid bifurcation without flow limiting stenosis. No dissection or ulceration. Left carotid system: Moderate mixed density plaque at the bifurcation with ~50% proximal ICA stenosis. No dissection or plaque ulceration. Vertebral arteries:No proximal subclavian artery stenosis. Left dominant vertebral artery. Atheromatous changes without dissection or flow limiting stenosis in the neck. Skeleton: No acute or aggressive process. Other neck: No incidental mass or adenopathy Upper chest: No acute finding. CTA HEAD Anterior circulation: Symmetric carotid arteries. Sizable right posterior communicating artery. Probable small anterior communicating artery. Carotid siphon atherosclerosis without stenosis. Scattered atheromatous narrowings  including a moderate distal left M1 segment stenosis. Other narrowings are M2 level or beyond. Negative for aneurysm. Posterior circulation: Dominant on the left. Most of right vertebral artery flow is into the right PICA with minimal if any flow within the distal right V4 segment. Small basilar artery in the setting of large right posterior communicating artery. There is multi focal atheromatous irregularity of the bilateral posterior cerebral arteries with moderate left P2 and advanced left P3/4 segment stenoses. Venous sinuses: Patent Anatomic variants: Fetal type PCA on the right Delayed phase: Negative for mass or parenchymal enhancement. IMPRESSION: 1. No acute finding including vertebral dissection. 2. Cervical carotid atherosclerosis with approximately 50% proximal left ICA stenosis. 3. Intracranial atherosclerosis, most notably moderate left M1 and left proximal PCA stenoses. Electronically Signed   By: Monte Fantasia M.D.   On: 12/27/2015 13:10    Procedures Procedures (including critical care time)  Medications Ordered in ED Medications  gabapentin (NEURONTIN) capsule 100 mg (not administered)  acetaminophen (TYLENOL) tablet 650 mg (325 mg Oral Given 12/27/15 1115)  iopamidol (ISOVUE-370) 76 % injection (50 mLs  Contrast Given 12/27/15 1151)     Initial Impression / Assessment and Plan / ED Course  I have reviewed the triage vital signs and the nursing notes.  Pertinent labs & imaging results that were available during my care of the patient were reviewed by me and considered in my medical decision making (see chart for details).  Clinical Course    CT, and CT and no pain to normal. No stroke, no hemorrhage, no sign of vertebral dissection. I discussed the patient with her primary care physician Dr. Burnice Logan. I recommended Neurontin. He is recommending neurological follow-up and he will arrange for outpatient neurology follow-up. Given a single dose to 100 mg Neurontin here.  Prescription for 100 twice a day. Cautioned of the possible sedative and orthostatic effects of the Neurontin she was in understanding. We'll alternate her tramadol and Tylenol that she had been taking prior to her visit today.  Final Clinical Impressions(s) / ED Diagnoses   Final diagnoses:  Occipital headache  Nonintractable headache, unspecified chronicity pattern, unspecified headache type    New Prescriptions New Prescriptions   GABAPENTIN (NEURONTIN) 100 MG CAPSULE    Take 1 capsule (100 mg total) by mouth 2 (two) times daily.     Tanna Furry, MD 12/27/15 1435

## 2015-12-27 NOTE — Telephone Encounter (Signed)
Pt called in stating she has a severe headache and right arm pain. Has had the headache on/off for about a week. No chest pain or dizziness no palpitations. BP 122/82 Hr controlled. She has not recently fallen and hit head or had any trauma to head. Recommended she follow up with her PCP whom she recently saw 8/2 for headache for further workup or she could go to urgent care if she felt it necessary. Pt verbalized understanding.

## 2015-12-30 ENCOUNTER — Telehealth: Payer: Self-pay | Admitting: Internal Medicine

## 2015-12-30 DIAGNOSIS — G4452 New daily persistent headache (NDPH): Secondary | ICD-10-CM

## 2015-12-30 NOTE — Telephone Encounter (Signed)
Pt would like to have a referral to the [facility] for headaches.

## 2015-12-31 ENCOUNTER — Other Ambulatory Visit: Payer: Self-pay | Admitting: Internal Medicine

## 2015-12-31 DIAGNOSIS — G8929 Other chronic pain: Secondary | ICD-10-CM

## 2015-12-31 DIAGNOSIS — R51 Headache: Principal | ICD-10-CM

## 2015-12-31 NOTE — Telephone Encounter (Signed)
Referral put in by Dr. Raliegh Ip and patient notified.

## 2015-12-31 NOTE — Telephone Encounter (Signed)
Okay for referral?

## 2016-01-04 ENCOUNTER — Ambulatory Visit (INDEPENDENT_AMBULATORY_CARE_PROVIDER_SITE_OTHER): Payer: Medicare Other | Admitting: Diagnostic Neuroimaging

## 2016-01-04 ENCOUNTER — Encounter: Payer: Self-pay | Admitting: Diagnostic Neuroimaging

## 2016-01-04 VITALS — BP 134/72 | HR 56 | Ht 65.0 in | Wt 125.6 lb

## 2016-01-04 DIAGNOSIS — M79601 Pain in right arm: Secondary | ICD-10-CM

## 2016-01-04 DIAGNOSIS — M5481 Occipital neuralgia: Secondary | ICD-10-CM

## 2016-01-04 MED ORDER — GABAPENTIN 100 MG PO CAPS: 100.0000 mg | ORAL_CAPSULE | Freq: Two times a day (BID) | ORAL | 6 refills | Status: DC

## 2016-01-04 MED ORDER — GABAPENTIN 100 MG PO CAPS: 100.0000 mg | ORAL_CAPSULE | Freq: Two times a day (BID) | ORAL | 0 refills | Status: DC

## 2016-01-04 NOTE — Progress Notes (Signed)
[facility]  PATIENT: [name] Small DOB: 08/02/1928  REFERRING CLINICIAN: Kwiatowski HISTORY FROM: patient and friend REASON FOR VISIT: new consult    HISTORICAL  CHIEF COMPLAINT:  Chief Complaint  Patient presents with  . Headache    rm 6, New Pt, dgtr-in-law- Kathy, "bad headaches x 1 month, neurontin bid, Tyleonl prn is helpful"    HISTORY OF PRESENT ILLNESS:   82 year old female here for evaluation of headaches. For past 1 month patient has onset of left occipital throbbing headache pain lasting 30 minutes or longer at a time. No nausea or vomiting, photophobia or phonophobia. Sometimes this is associated with right proximal arm pain. She denies any history of similar headaches, migraines, falls, accidents or injuries. Headaches occurring at least twice per day. Patient has been prescribed gabapentin 100 mg at bedtime which slightly helps. Sometimes she takes a second 100 mg tablet in the daytime.  Patient has history of hypercholesteremia, atrial fibrillation, chronic anticoagulation, and went to the emergency room recently for these headaches, had CT scan of the head which is unremarkable (12/27/15).  Patient goes to sleep at 9 PM and wakes up at 5:30 AM. She tends to wake up multiple times at night. Headaches have been interrupting her sleep.  Also of note in the last 2 months patient has had increasing problems with malaise, fatigue, weakness, bradycardia, requiring pacemaker placement in July 2017.  Also of note patient had a fall and hip fracture on the left side in February 2016.   REVIEW OF SYSTEMS: Full 14 system review of systems performed and negative with exception of: Headache sleepiness not asleep.  ALLERGIES: Allergies  Allergen Reactions  . Lisinopril Other (See Comments)    Cough    HOME MEDICATIONS: Outpatient Medications Prior to Visit  Medication Sig Dispense Refill  . acetaminophen (TYLENOL) 500 MG tablet Take 500 mg by mouth every  6 (six) hours as needed for mild pain or headache.     [name] amiodarone (PACERONE) 200 MG tablet Take 1 tablet (200 mg total) by mouth daily. 30 tablet 3  . atorvastatin (LIPITOR) 40 MG tablet TAKE 1 TABLET BY MOUTH DAILY (Patient taking differently: TAKE 40 MG BY MOUTH DAILY) 90 tablet 1  . Calcium Carbonate-Vitamin D (CALTRATE 600+D PO) Take 1 tablet by mouth daily.      [name] gabapentin (NEURONTIN) 100 MG capsule Take 1 capsule (100 mg total) by mouth 2 (two) times daily. 60 capsule 0  . Polyvinyl Alcohol-Povidone (REFRESH OP) Place 1 drop into both eyes 2 (two) times daily.    . Rivaroxaban (XARELTO) 15 MG TABS tablet Take 1 tablet (15 mg total) by mouth daily with supper. 30 tablet 11  . traMADol (ULTRAM) 50 MG tablet Take 1 tablet (50 mg total) by mouth every 6 (six) hours as needed. (Patient taking differently: Take 50 mg by mouth every 6 (six) hours as needed for moderate pain. ) 30 tablet 0   No facility-administered medications prior to visit.     PAST MEDICAL HISTORY: Past Medical History:  Diagnosis Date  . Atrial fibrillation ([place])   . Bleeding stomach ulcer 1980s?  [name] Cancer ([place]) Meloanoma left leg  . DIVERTICULOSIS, COLON 11/22/2006  . Headache(784.0)    "very often; not regular" (11/25/2015)  . History of blood transfusion 1980s   "when I had the bleeding ulcer"  . HYPERLIPIDEMIA 11/22/2006  . Hypertension   . Menopausal syndrome (hot flashes)   . OSTEOPOROSIS 11/22/2006  . Presence of permanent cardiac  pacemaker     PAST SURGICAL HISTORY: Past Surgical History:  Procedure Laterality Date  . APPENDECTOMY    . BUNIONECTOMY Right   . CATARACT EXTRACTION W/ INTRAOCULAR LENS  IMPLANT, BILATERAL Bilateral 2000s  . CHOLECYSTECTOMY OPEN    . EP IMPLANTABLE DEVICE N/A 11/25/2015   Procedure: Pacemaker Implant;  Surgeon: Will Meredith Leeds, MD;  Location: [facility];  Service: Cardiovascular;  Laterality: N/A;  . FEMUR IM NAIL Left 07/08/2014   Procedure: INTRAMEDULLARY (IM) NAIL  ;  Surgeon: Meredith Pel, MD;  Location: [facility];  Service: Orthopedics;  Laterality: Left;  . FRACTURE SURGERY      FAMILY HISTORY: Family History  Problem Relation Age of Onset  . Alzheimer's disease Sister   . Cardiomyopathy Brother     SOCIAL HISTORY:  Social History   Social History  . Marital status: Widowed    Spouse name: N/A  . Number of children: 2  . Years of education: 12   Occupational History  .      retired   Social History Main Topics  . Smoking status: Never Smoker  . Smokeless tobacco: Never Used  . Alcohol use No  . Drug use: No  . Sexual activity: No   Other Topics Concern  . Not on file   Social History Narrative   Lives alone, son lives beside her   Caffeine - none     PHYSICAL EXAM   GENERAL EXAM/CONSTITUTIONAL: Vitals:  Vitals:   01/04/16 0847  BP: 134/72  Pulse: (!) 56  Weight: 125 lb 9.6 oz (57 kg)  Height: 5\' 5"  (1.651 m)     Body mass index is 20.9 kg/m.  Visual Acuity Screening   Right eye Left eye Both eyes  Without correction:     With correction: 20/40 20/40      Patient is in no distress; well developed, nourished and groomed; neck is supple  NO OCCIPITAL NERVE TENDERNESS  MASKED FACIES; FLATE AFFECT  CARDIOVASCULAR:  Examination of carotid arteries is normal; no carotid bruits  Regular rate and rhythm, no murmurs  Examination of peripheral vascular system by observation and palpation is normal  EYES:  Ophthalmoscopic exam of optic discs and posterior segments is normal; no papilledema or hemorrhages  MUSCULOSKELETAL:  Gait, strength, tone, movements noted in Neurologic exam below  NEUROLOGIC: MENTAL STATUS:  No flowsheet data found.  awake, alert, oriented to person, place and time  recent and remote memory intact  normal attention and concentration  language fluent, comprehension intact, naming intact,   fund of knowledge appropriate  CRANIAL NERVE:   2nd - no papilledema on  fundoscopic exam  2nd, 3rd, 4th, 6th - pupils equal and reactive to light, visual fields full to confrontation, extraocular muscles intact, no nystagmus  5th - facial sensation symmetric  7th - facial strength symmetric  8th - hearing intact  9th - palate elevates symmetrically, uvula midline  11th - shoulder shrug symmetric  12th - tongue protrusion midline  MOTOR:   normal bulk and tone, full strength in the BUE, BLE  SUBTLE RESTING TREMOR IN LEFT HAND  CLICKS AND POPS WITH RIGHT SHOULDER PASSIVE AND ACTIVE ROM  SENSORY:   normal and symmetric to light touch, temperature, vibration; ABSENT VIBRATION AT TOES  COORDINATION:   finger-nose-finger, fine finger movements normal  REFLEXES:   deep tendon reflexes TRACE and symmetric  GAIT/STATION:   SLOW CAUTIOUS, narrow based gait; UNSTEADY    DIAGNOSTIC DATA (LABS, IMAGING, TESTING) -  I reviewed patient records, labs, notes, testing and imaging myself where available.  Lab Results  Component Value Date   WBC 5.9 12/27/2015   HGB 14.3 12/27/2015   HCT 42.0 12/27/2015   MCV 92.0 12/27/2015   PLT 216 12/27/2015      Component Value Date/Time   NA 141 12/27/2015 1119   NA 138 07/15/2014   K 4.6 12/27/2015 1119   CL 102 12/27/2015 1119   CO2 28 12/27/2015 1105   GLUCOSE 91 12/27/2015 1119   BUN 14 12/27/2015 1119   BUN 20 07/15/2014   CREATININE 1.10 (H) 12/27/2015 1119   CALCIUM 9.8 12/27/2015 1105   PROT 7.0 03/09/2015 1140   ALBUMIN 3.6 03/09/2015 1140   AST 15 03/09/2015 1140   ALT 12 (L) 03/09/2015 1140   ALKPHOS 75 03/09/2015 1140   BILITOT 0.5 03/09/2015 1140   GFRNONAA 41 (L) 12/27/2015 1105   GFRAA 47 (L) 12/27/2015 1105   Lab Results  Component Value Date   CHOL 277 (H) 04/07/2014   HDL 62.00 04/07/2014   LDLCALC 192 (H) 04/07/2014   LDLDIRECT 176.0 02/24/2008   TRIG 114.0 04/07/2014   CHOLHDL 4 04/07/2014   Lab Results  Component Value Date   HGBA1C 6.1 (H) 12/07/2014   No  results found for: VITAMINB12 Lab Results  Component Value Date   TSH 1.019 12/07/2014    12/08/15 EKG - Atrial-paced rhythm - Incomplete right bundle branch block - Left anterior fascicular block - Abnormal ECG - Atrial bigmeiny no longer present  12/27/15 CT head [I reviewed images myself and agree with interpretation. -VRP]  1. No acute intracranial abnormalities. 2. Chronic microvascular disease and brain atrophy.  12/27/15 CTA head/neck [I reviewed images myself and agree with interpretation. -VRP]  1. No acute finding including vertebral dissection. 2. Cervical carotid atherosclerosis with approximately 50% proximal left ICA stenosis. 3. Intracranial atherosclerosis, most notably moderate left M1 and left proximal PCA stenoses.      ASSESSMENT AND PLAN  82 y.o. year old female here with new onset left occipital headache with right arm pain. Could represent new onset of migraine phenomenon although this would be unusual at patient's age without prior history of migraine. Cervicogenic headache or other headache is possible. Patient does have a new pacemaker placed in July 2017 which is MRI conditional. We'll try to check MRI brain and cervical spine for further evaluation. Also will adjust gabapentin treatment dose and referred to occupational therapy for right shoulder arthritis.   Dx:  1. Occipital neuralgia of left side   2. Right arm pain      PLAN: - MRI brain and cervical spine (has pacemaker: MR conditional: Medtronic Advisa DR MRI model A2DR01 1 (serial number [id number]) pacemaker,Medtronic model 5076 (serial number [id number]) right atrial lead and a Medtronic model 5076 (serial number [id number]) right ventricular lead) - may increase gabapentin to 200-300mg  at bedtime; then up to 200-300mg  BID - refer to OT for right arm pain  Orders Placed This Encounter  Procedures  . MR Brain Wo Contrast  . MR Cervical Spine Wo Contrast  . Ambulatory referral to  Occupational Therapy   Meds ordered this encounter  Medications  . DISCONTD: gabapentin (NEURONTIN) 100 MG capsule    Sig: Take 1 capsule (100 mg total) by mouth 2 (two) times daily.    Dispense:  60 capsule    Refill:  0  . gabapentin (NEURONTIN) 100 MG capsule    Sig: Take 1-3  capsules (100-300 mg total) by mouth 2 (two) times daily.    Dispense:  180 capsule    Refill:  6   Return in about 6 weeks (around 02/15/2016).    Penni Bombard, MD 0000000, 123456 AM Certified in Neurology, Neurophysiology and Neuroimaging  [facility] [address] [place], [place] [address] [phone]

## 2016-01-04 NOTE — Patient Instructions (Signed)
-   I will check MRI brain and cervical   - I will refer you to occupational therapy for right shoulder / arm pain  - may increase gabapentin to 200 or 300mg  at bedtime; may increase to 200-300mg  in the day time  - keep track of headaches and right arm pain in a calendar journal

## 2016-01-06 DIAGNOSIS — H3562 Retinal hemorrhage, left eye: Secondary | ICD-10-CM | POA: Diagnosis not present

## 2016-01-06 DIAGNOSIS — H35033 Hypertensive retinopathy, bilateral: Secondary | ICD-10-CM | POA: Diagnosis not present

## 2016-01-06 DIAGNOSIS — H3561 Retinal hemorrhage, right eye: Secondary | ICD-10-CM | POA: Diagnosis not present

## 2016-01-07 ENCOUNTER — Telehealth: Payer: Self-pay | Admitting: Diagnostic Neuroimaging

## 2016-01-07 NOTE — Telephone Encounter (Signed)
Called and spoke with patients daughter and law. She is going to call [facility] and have the patient scheduled.

## 2016-01-07 NOTE — Telephone Encounter (Signed)
[facility] called to let our office know pt has a Psychologist, forensic and can not do a MRI . They can do a CT though . May call [phone]

## 2016-01-10 ENCOUNTER — Other Ambulatory Visit: Payer: Self-pay | Admitting: Cardiovascular Disease

## 2016-01-11 ENCOUNTER — Ambulatory Visit: Payer: Medicare Other | Attending: Diagnostic Neuroimaging | Admitting: Occupational Therapy

## 2016-01-11 DIAGNOSIS — M79601 Pain in right arm: Secondary | ICD-10-CM

## 2016-01-11 DIAGNOSIS — M6281 Muscle weakness (generalized): Secondary | ICD-10-CM | POA: Diagnosis not present

## 2016-01-11 NOTE — Therapy (Addendum)
[facility] [address] [place], [place], [address] Phone: [phone]   Fax:  [phone]  Occupational Therapy Evaluation  Patient Details  Name: [name] Small MRN: [id number] Date of Birth: 04-01-1929 Referring Provider: Dr. Andrey Spearman  Encounter Date: 01/11/2016      OT End of Session - 01/11/16 1704    Visit Number 1   Number of Visits 9   Date for OT Re-Evaluation 02/11/16   Authorization Type [employer] Medicare, no visit limit, no auth; G-code needed   Authorization Time Period cert date 123456   Authorization - Visit Number 1   Authorization - Number of Visits 10   OT Start Time 0808   OT Stop Time 0845   OT Time Calculation (min) 37 min   Activity Tolerance Patient tolerated treatment well   Behavior During Therapy [facility] for tasks assessed/performed      Past Medical History:  Diagnosis Date  . Atrial fibrillation ([place])   . Bleeding stomach ulcer 1980s?  [name] Cancer ([place]) Meloanoma left leg  . DIVERTICULOSIS, COLON 11/22/2006  . Headache(784.0)    "very often; not regular" (11/25/2015)  . History of blood transfusion 1980s   "when I had the bleeding ulcer"  . HYPERLIPIDEMIA 11/22/2006  . Hypertension   . Menopausal syndrome (hot flashes)   . OSTEOPOROSIS 11/22/2006  . Presence of permanent cardiac pacemaker     Past Surgical History:  Procedure Laterality Date  . APPENDECTOMY    . BUNIONECTOMY Right   . CATARACT EXTRACTION W/ INTRAOCULAR LENS  IMPLANT, BILATERAL Bilateral 2000s  . CHOLECYSTECTOMY OPEN    . EP IMPLANTABLE DEVICE N/A 11/25/2015   Procedure: Pacemaker Implant;  Surgeon: Will Meredith Leeds, MD;  Location: [facility];  Service: Cardiovascular;  Laterality: N/A;  . FEMUR IM NAIL Left 07/08/2014   Procedure: INTRAMEDULLARY (IM) NAIL ;  Surgeon: Meredith Pel, MD;  Location: [facility];  Service: Orthopedics;  Laterality: Left;  . FRACTURE SURGERY      There were no vitals  filed for this visit.      Subjective Assessment - 01/11/16 0812    Subjective  "It hurts when my head hurts"  "It's better now"  "It was sore last night"   Patient is accompained by: Family member  dtr in law   Pertinent History pacemaker placed 11/2015, a-fib, headaches (MRI scheduled Wed), HTN, fall, L hip fx/surgery, osteoporosis   Limitations pacemaker, fall risk (using single point cane), no lifting >5lbs or full overhead movement with LUE due to pacemaker   Patient Stated Goals improve arm pain   Currently in Pain? Yes   Pain Score 5   arm pain 0-10/10; head pain 0-10/10 but 5/10 today   Pain Location Arm  R arm, headache   Pain Orientation Right;Upper  R elbow to shoulder   Pain Descriptors / Indicators Aching  headache throbbing   Pain Type Acute pain   Pain Onset More than a month ago   Pain Frequency Intermittent   Aggravating Factors  arm hurts worse when head hurts worse   Pain Relieving Factors medication   Effect of Pain on Daily Activities OT will monitor headache as relates to treatment, but will only address RUE pain           [facility] OT Assessment - 01/11/16 0001      Assessment   Diagnosis R arm pain (MD note indicates arthritis)   Referring Provider Dr. Andrey Spearman   Onset  Date --  approx 2 months ago   Assessment pacemaker placement 11/2015, ambulates with single point cane     Precautions   Precautions Fall;ICD/Pacemaker  ambulates with single point cane   Precaution Comments reports no lifting >5lbs with LUE and no raising LUE up fully (due to pacemaker placement)     Balance Screen   Has the patient fallen in the past 6 months No     Home  Environment   Family/patient expects to be discharged to: Private residence   Lives With Alone  family lives next door and assists     Prior Function   Level of Independence Independent with basic ADLs;Independent with household mobility with device     ADL   ADL comments Pt performing BADLs  independently with R dominant UE     IADL   Prior Level of Function Shopping has assistance for carrying groceries in the house   Prior Level of Function Light Housekeeping performing light tasks   Prior Level of Function Meal Prep performing light tasks   Community Mobility Drives own vehicle   Medication Management Is responsible for taking medication in correct dosages at correct time     Mobility   Mobility Status History of falls  with L hip fx/surgery 06/2014   Mobility Status Comments Ambulates mod I with single-point cane     Written Expression   Dominant Hand Right     Cognition   Overall Cognitive Status Within Functional Limits for tasks assessed     Sensation   Additional Comments denies numbness/tingling     Coordination   Fine Motor Movements are Fluid and Coordinated --  appears grossly WNL     ROM / Strength   AROM / PROM / Strength AROM;PROM;Strength     AROM   Overall AROM  Within functional limits for tasks performed   Overall AROM Comments BUEs grossly WFL however, did not fully test LUE due to precautions (only what was comfortable to pt, >75% ROM with L shoulder).  mild crepitus noted in both shoulders R slightly greater than L, particulary with IR      PROM   Overall PROM  Within functional limits for tasks performed   Overall PROM Comments RUE/shoulder WNL, with mild crepitus, particulary in IR bilaterally     Strength   Overall Strength Deficits   Overall Strength Comments BUE shoulder/elbow strength grossly 4/5    Strength Assessment Site Shoulder;Elbow   Right/Left Shoulder Right;Left                         OT Education - 01/11/16 1703    Education provided Yes   Education Details recommendation that pt journal when she gets arm pain and bring to next therapy session to try to establish patterns; Recommended pt clarify precautions for LUE at next cardiology visit   Person(s) Educated Patient;Caregiver(s)   Methods  Explanation   Comprehension Verbalized understanding             OT Long Term Goals - 01/11/16 1732      OT LONG TERM GOAL #1   Title Pt will be independent with HEP.--check goals 02/11/16   Time 4   Period Weeks   Status New     OT LONG TERM GOAL #2   Title Pt will report pain less than 3/10 consistently for ADLs.   Time 4   Period Weeks   Status New     OT  LONG TERM GOAL #3   Title Pt will verbalize understanding of activity modifications and pain management strategies/positioning to decr pain prn.   Time 4   Period Weeks   Status New     OT LONG TERM GOAL #4   Period Weeks   Status New               Plan - 01/11/16 1705    Clinical Impression Statement Pt is an 82 y.o. female referred to occupational therapy for R arm pain (MD note reports R shoulder arthritis).  Pt presents with decr strength and pain in RUE.  Pt had pacemaker placement in July 2017 and L hip fx with surgery in 2016.  Pt also has hx of HTN, a-fib, osteoporosis, headache, fall hx, and uses single-point cane.  Pt would benefit from occupational therapy to address RUE pain and weakness to improve RUE functional use and quality of life.   Rehab Potential Good   OT Frequency 2x / week   OT Duration 4 weeks  +eval (or 8 visits over 8 weeks if needed to monitor pain/update HEP)   Plan review pain journal to see if activities aggravate or pattern, initiate HEP (?cane)   OT Home Exercise Plan Education provided:  pain journal   Consulted and Agree with Plan of Care Patient      Patient will benefit from skilled therapeutic intervention in order to improve the following deficits and impairments:  Pain, Decreased strength, Decreased activity tolerance, Impaired UE functional use  Visit Diagnosis: Pain In Right Arm - Plan: Ot plan of care cert/re-cert  Muscle weakness (generalized) - Plan: Ot plan of care cert/re-cert      G-Codes - [id number]    Functional Assessment Tool Used R arm pain  0-10/10   Functional Limitation Self care   Self Care Current Status [phone]) At least 40 percent but less than 60 percent impaired, limited or restricted   Self Care Goal Status [id number]) At least 1 percent but less than 20 percent impaired, limited or restricted      Problem List Patient Active Problem List   Diagnosis Date Noted  . AF (atrial fibrillation) ([place]) 11/25/2015  . Atrial fibrillation ([place])   . Troponin level elevated 12/08/2014  . Hyperglycemia 12/07/2014  . Atrial fibrillation with RVR-CHADs VASc=4 12/07/2014  . Intertrochanteric fracture of left hip ([place]) 07/08/2014  . Fall   . Overactive bladder 04/02/2012  . Essential hypertension 04/02/2012  . Chest pain, atypical 09/17/2011  . Dehydration 09/16/2011  . Palpitations 09/16/2011  . Dyslipidemia 11/22/2006  . DIVERTICULOSIS, COLON 11/22/2006  . Osteoporosis 11/22/2006    [facility] 01/11/2016, 5:47 PM  [facility] [address] [place], [place], [address] Phone: [phone]   Fax:  [phone]  Name: [name] Small MRN: [id number] Date of Birth: 05-08-1929   Vianne Bulls, OTR/L [facility] [address] [place],   [phone] phone [phone] 01/11/16 5:47 PM

## 2016-01-11 NOTE — Patient Instructions (Addendum)
   Please write down the date/time every time you have arm pain.  Also write down how intense it is on a 0-10 scale and what you were doing when the pain started.  Bring it with you to your next therapy appointment.

## 2016-01-17 ENCOUNTER — Ambulatory Visit: Payer: Medicare Other | Admitting: Occupational Therapy

## 2016-01-17 DIAGNOSIS — M6281 Muscle weakness (generalized): Secondary | ICD-10-CM

## 2016-01-17 DIAGNOSIS — M79601 Pain in right arm: Secondary | ICD-10-CM

## 2016-01-17 NOTE — Patient Instructions (Addendum)
   1.  Lay on your back.  Hold cane in both hands.  Place right hand on handle of cane with thumb facing up and hold cane vertically with left hand on the bottom.  Then slowly raise right arm overhead using left arm to push for a stretch.  Hold 3 sec.  Repeat 10x, 2x/day.  Flexion (Eccentric) - Active-Assist (Cane)       2.   ROM: Abduction - Wand   Holding wand with right hand palm up, push wand directly out to side, leading with other hand palm down, until stretch is felt. Hold 3 seconds. Repeat 10 times per set. Do 2 sessions per day. (Lying down)   ROM: Extension - Wand (Standing)   Stand holding wand behind back. Raise arms as far as possible. Repeat 10 times per set.  Do 2-3 sessions per day.    Active Assistive Shoulder External Rotation    SIT with stick at waist level, right palm up, other palm down. , push forearm out from body with elbows bent and next to your side. Hold 3sec Perform 10 reps. 2X DAY   Copyright  VHI. All rights reserved.  http://ecce.exer.us/152   SHOULDER: Flexion At Wall    Slide right arms up wall. Maintain upright posture and tuck in stomach. Hold 3 seconds. 10 reps per set, 2x per day  Copyright  VHI. All rights reserved.

## 2016-01-17 NOTE — Therapy (Signed)
[facility] [address] [place], [place], [address] Phone: [phone]   Fax:  [phone]  Occupational Therapy Treatment  Patient Details  Name: [name] Small MRN: [id number] Date of Birth: [birth year] Referring Provider: Dr. Andrey Spearman  Encounter Date: 01/17/2016      OT End of Session - 01/17/16 0810    Visit Number 2   Number of Visits 9   Date for OT Re-Evaluation 02/11/16   Authorization Type UHC Medicare, no visit limit, no auth; G-code needed   Authorization Time Period cert date 123456   Authorization - Visit Number 2   Authorization - Number of Visits 10   OT Start Time 0806   OT Stop Time 0834   OT Time Calculation (min) 28 min   Activity Tolerance Patient tolerated treatment well   Behavior During Therapy [facility] for tasks assessed/performed      Past Medical History:  Diagnosis Date  . Atrial fibrillation ([place])   . Bleeding stomach ulcer 1980s?  [name] Cancer ([place]) Meloanoma left leg  . DIVERTICULOSIS, COLON 11/22/2006  . Headache(784.0)    "very often; not regular" (11/25/2015)  . History of blood transfusion 1980s   "when I had the bleeding ulcer"  . HYPERLIPIDEMIA 11/22/2006  . Hypertension   . Menopausal syndrome (hot flashes)   . OSTEOPOROSIS 11/22/2006  . Presence of permanent cardiac pacemaker     Past Surgical History:  Procedure Laterality Date  . APPENDECTOMY    . BUNIONECTOMY Right   . CATARACT EXTRACTION W/ INTRAOCULAR LENS  IMPLANT, BILATERAL Bilateral 2000s  . CHOLECYSTECTOMY OPEN    . EP IMPLANTABLE DEVICE N/A 11/25/2015   Procedure: Pacemaker Implant;  Surgeon: Will Meredith Leeds, MD;  Location: [facility];  Service: Cardiovascular;  Laterality: N/A;  . FEMUR IM NAIL Left 07/08/2014   Procedure: INTRAMEDULLARY (IM) NAIL ;  Surgeon: Meredith Pel, MD;  Location[facility];  Service: Orthopedics;  Laterality: Left;  . FRACTURE SURGERY      There were no vitals  filed for this visit.      Subjective Assessment - 01/17/16 0808    Subjective  arm hasn't hurt since last session, no improvement in headaches (MRI Wed), but no pain currently   Pertinent History pacemaker placed 11/2015, a-fib, headaches (MRI scheduled Wed), HTN, fall, L hip fx/surgery, osteoporosis   Limitations pacemaker, fall risk (using single point cane), no lifting >5lbs or full overhead movement with LUE due to pacemaker   Patient Stated Goals improve arm pain   Currently in Pain? No/denies       Recommended cancel 2nd appt this week due to improved (no pain) and will check on pain and HEP next week.  Pt/caregiver verbalized agreement.  Arm bike x41min level 1 for reciprocal movement/conditioning (forward/backwards) with no pain or rest.                        OT Education - 01/17/16 0839    Education Details HEP for ROM   Person(s) Educated Patient;Caregiver(s)   Methods Explanation;Verbal cues;Handout;Demonstration   Comprehension Returned demonstration;Verbalized understanding             OT Long Term Goals - 01/11/16 1732      OT LONG TERM GOAL #1   Title Pt will be independent with HEP.--check goals 02/11/16   Time 4   Period Weeks   Status New     OT LONG TERM GOAL #2  Title Pt will report pain less than 3/10 consistently for ADLs.   Time 4   Period Weeks   Status New     OT LONG TERM GOAL #3   Title Pt will verbalize understanding of activity modifications and pain management strategies/positioning to decr pain prn.   Time 4   Period Weeks   Status New     OT LONG TERM GOAL #4   Period Weeks   Status New               Plan - 01/17/16 0831    Clinical Impression Statement Pt reports no pain in RUE since last session.  Pt tolerated initial HEP well with no pain/discomfort.     Rehab Potential Good   OT Frequency 2x / week   OT Duration 4 weeks  +eval (or 8 visits over 8 weeks if needed to monitor pain/update HEP)    Plan review ROM HEP, assess pain (if any and possible aggravating activities), ?add light strengthening every other day? if appropriate   OT Home Exercise Plan Education provided:  pain journal; initial HEP   Consulted and Agree with Plan of Care Patient      Patient will benefit from skilled therapeutic intervention in order to improve the following deficits and impairments:  Pain, Decreased strength, Decreased activity tolerance, Impaired UE functional use  Visit Diagnosis: Pain In Right Arm  Muscle weakness (generalized)    Problem List Patient Active Problem List   Diagnosis Date Noted  . AF (atrial fibrillation) ([place]) 11/25/2015  . Atrial fibrillation ([place])   . Troponin level elevated 12/08/2014  . Hyperglycemia 12/07/2014  . Atrial fibrillation with RVR-CHADs VASc=4 12/07/2014  . Intertrochanteric fracture of left hip ([place]) 07/08/2014  . Fall   . Overactive bladder 04/02/2012  . Essential hypertension 04/02/2012  . Chest pain, atypical 09/17/2011  . Dehydration 09/16/2011  . Palpitations 09/16/2011  . Dyslipidemia 11/22/2006  . DIVERTICULOSIS, COLON 11/22/2006  . Osteoporosis 11/22/2006    [facility] 01/17/2016, 8:42 AM  [facility] [address] [place], [place], [address] Phone: [phone]   Fax:  [phone]  Name: [name] Small MRN: [id number] Date of Birth: 1928/07/29   Vianne Bulls, OTR/L [facility] [address] [place], [place]  [address] [phone] phone [phone] 01/17/16 8:42 AM

## 2016-01-18 ENCOUNTER — Ambulatory Visit: Payer: Medicare Other | Admitting: Occupational Therapy

## 2016-01-19 ENCOUNTER — Ambulatory Visit (HOSPITAL_COMMUNITY)
Admission: RE | Admit: 2016-01-19 | Discharge: 2016-01-19 | Disposition: A | Discharge status: Home / Self Care | Payer: Medicare Other | Source: Ambulatory Visit | Attending: Diagnostic Neuroimaging | Admitting: Diagnostic Neuroimaging

## 2016-01-19 ENCOUNTER — Encounter (HOSPITAL_COMMUNITY): Payer: Self-pay

## 2016-01-19 DIAGNOSIS — M5481 Occipital neuralgia: Secondary | ICD-10-CM | POA: Diagnosis not present

## 2016-01-19 DIAGNOSIS — R51 Headache: Secondary | ICD-10-CM | POA: Diagnosis not present

## 2016-01-19 DIAGNOSIS — R9082 White matter disease, unspecified: Secondary | ICD-10-CM | POA: Insufficient documentation

## 2016-01-19 DIAGNOSIS — M79601 Pain in right arm: Secondary | ICD-10-CM

## 2016-01-19 DIAGNOSIS — G319 Degenerative disease of nervous system, unspecified: Secondary | ICD-10-CM | POA: Diagnosis not present

## 2016-01-25 ENCOUNTER — Telehealth: Payer: Self-pay | Admitting: *Deleted

## 2016-01-25 NOTE — Telephone Encounter (Signed)
Per Dr Leta Baptist, spoke with patient and informed her that her MRI brain results are unremarkable. Advised Dr Leta Baptist will continue with her current treatment plan of neuro rehab and FU here on 02/15/16. She verbalized understanding, appreciation of call.

## 2016-01-26 ENCOUNTER — Ambulatory Visit: Payer: Medicare Other | Attending: Diagnostic Neuroimaging | Admitting: Occupational Therapy

## 2016-01-26 DIAGNOSIS — M79601 Pain in right arm: Secondary | ICD-10-CM | POA: Diagnosis not present

## 2016-01-26 DIAGNOSIS — M6281 Muscle weakness (generalized): Secondary | ICD-10-CM | POA: Diagnosis not present

## 2016-01-26 NOTE — Patient Instructions (Addendum)
Perform strengthening exercises with band every other day, perform cane and wall slides on the days you don't strengthen Strengthening: Resisted Flexion   Hold tubing with _right____ arm(s) at side. Pull forward and up. Move shoulder through pain-free range of motion. Repeat __10__ times per set.  Do _1-2_ sessions per day , every other day   Strengthening: Resisted Extension   Hold tubing in __right___ hand(s), arm forward. Pull arm back, elbow straight. Repeat _10___ times per set. Do _1-2___ sessions per day, every other day.      Elbow Flexion: Resisted   With tubing held in ___right___ hand(s) and other end secured under foot, curl arm up as far as possible. Repeat _10___ times per set. Do _1-2___ sessions per day, every other day.

## 2016-01-26 NOTE — Therapy (Signed)
[facility] [address] [place], [place], [address] Phone: [phone]   Fax:  [phone]  Occupational Therapy Treatment  Patient Details  Name: [name] Small MRN: [id number] Date of Birth: 1928-12-24 Referring Provider: Dr. Andrey Spearman  Encounter Date: 01/26/2016      OT End of Session - 01/26/16 0920    Visit Number 3   Number of Visits 9   Date for OT Re-Evaluation 02/11/16   Authorization Type UHC Medicare, no visit limit, no auth; G-code needed   Authorization Time Period cert date 6/75/91-63/84/66   Authorization - Visit Number 3   Authorization - Number of Visits 10   OT Start Time [phone]  d/c visit 2 units only   OT Stop Time 0915   OT Time Calculation (min) 33 min   Activity Tolerance Patient tolerated treatment well   Behavior During Therapy [facility] for tasks assessed/performed      Past Medical History:  Diagnosis Date  . Atrial fibrillation ([place])   . Bleeding stomach ulcer 1980s?  [name] Cancer ([place]) Meloanoma left leg  . DIVERTICULOSIS, COLON 11/22/2006  . Headache(784.0)    "very often; not regular" (11/25/2015)  . History of blood transfusion 1980s   "when I had the bleeding ulcer"  . HYPERLIPIDEMIA 11/22/2006  . Hypertension   . Menopausal syndrome (hot flashes)   . OSTEOPOROSIS 11/22/2006  . Presence of permanent cardiac pacemaker     Past Surgical History:  Procedure Laterality Date  . APPENDECTOMY    . BUNIONECTOMY Right   . CATARACT EXTRACTION W/ INTRAOCULAR LENS  IMPLANT, BILATERAL Bilateral 2000s  . CHOLECYSTECTOMY OPEN    . EP IMPLANTABLE DEVICE N/A 11/25/2015   Procedure: Pacemaker Implant;  Surgeon: Will Meredith Leeds, MD;  Location: [facility];  Service: Cardiovascular;  Laterality: N/A;  . FEMUR IM NAIL Left 07/08/2014   Procedure: INTRAMEDULLARY (IM) NAIL ;  Surgeon: Meredith Pel, MD;  [facility];  Service: Orthopedics;  Laterality: Left;  . FRACTURE SURGERY    .  REVISION TOTAL HIP ARTHROPLASTY Left 06/2014    There were no vitals filed for this visit.      Subjective Assessment - 01/26/16 0842    Subjective  Pt reports no pain , she has been exercising at home   Pertinent History pacemaker placed 11/2015, a-fib, headaches (MRI scheduled Wed), HTN, fall, L hip fx/surgery, osteoporosis   Limitations pacemaker, fall risk (using single point cane), no lifting >5lbs or full overhead movement with LUE due to pacemaker   Currently in Pain? No/denies                              OT Education - 01/26/16 [phone]    Education provided Yes   Education Details HEP for RUE strengthening, review of prior HEP with cane and wall slides for RUE   Person(s) Educated Patient;Caregiver(s)   Methods Explanation;Demonstration;Verbal cues;Handout   Comprehension Verbalized understanding;Returned demonstration             OT Long Term Goals - 01/26/16 0847      OT LONG TERM GOAL #1   Title Pt will be independent with HEP.--check goals 02/11/16   Time 4   Period Weeks   Status Achieved     OT LONG TERM GOAL #2   Title Pt will report pain less than 3/10 consistently for ADLs.   Time 4   Status Achieved  OT LONG TERM GOAL #3   Title Pt will verbalize understanding of activity modifications and pain management strategies/positioning to decr pain prn.   Time 4   Status Deferred  denies pain and is performing activities normally, using RUE primarily due to pacemaker placement left                Plan - 02/03/2016 7628    Clinical Impression Statement Pt reports no pain in RUE, she thinks she has returned to prior functional level.   Rehab Potential Good   OT Frequency 2x / week   OT Duration 4 weeks   OT Treatment/Interventions Self-care/ADL training;Patient/family education;Therapeutic exercises   Plan discharge OT   OT Home Exercise Plan Education provided:  pain journal; initial HEP   Consulted and Agree with Plan of  Care Patient;Family member/caregiver   Family Member Consulted daughter in law      Patient will benefit from skilled therapeutic intervention in order to improve the following deficits and impairments:  Pain, Decreased strength, Decreased activity tolerance, Impaired UE functional use  Visit Diagnosis: Pain In Right Arm  Muscle weakness (generalized)      G-Codes - 2016-02-03 0925    Functional Assessment Tool Used R arm pain 0/10   Functional Limitation Self care   Self Care Goal Status (B1517) At least 1 percent but less than 20 percent impaired, limited or restricted   Self Care Discharge Status [phone]) At least 1 percent but less than 20 percent impaired, limited or restricted      Problem List Patient Active Problem List   Diagnosis Date Noted  . AF (atrial fibrillation) ([place]) 11/25/2015  . Atrial fibrillation ([place])   . Troponin level elevated 12/08/2014  . Hyperglycemia 12/07/2014  . Atrial fibrillation with RVR-CHADs VASc=4 12/07/2014  . Intertrochanteric fracture of left hip ([place]) 07/08/2014  . Fall   . Overactive bladder 04/02/2012  . Essential hypertension 04/02/2012  . Chest pain, atypical 09/17/2011  . Dehydration 09/16/2011  . Palpitations 09/16/2011  . Dyslipidemia 11/22/2006  . DIVERTICULOSIS, COLON 11/22/2006  . Osteoporosis 11/22/2006  OCCUPATIONAL THERAPY DISCHARGE SUMMARY   Current functional level related to goals / functional outcomes: Pt made excellent progress overall and achieved all goals. Pt's pain is resolved and she feels she is at prior level of function.   Remaining deficits:n/a   Education / Equipment: Pt was educated in HEP for strengthening and stretching. Pt returned demonstration.  Plan: Patient agrees to discharge.  Patient goals were met. Patient is being discharged due to meeting the stated rehab goals.  ?????      Alif Petrak 2016/02/03, 9:26 AM Theone Murdoch, OTR/L [phone] Phone: [phone] 9:26 AM  February 03, 2016 [facility] [address] [place], [place], [address] Phone: [phone]   Fax:  [phone]  Name: [name] Small MRN: [id number] Date of Birth: July 23, 1928

## 2016-01-28 ENCOUNTER — Encounter: Payer: Medicare Other | Admitting: Occupational Therapy

## 2016-02-01 ENCOUNTER — Encounter: Payer: Medicare Other | Admitting: Occupational Therapy

## 2016-02-09 ENCOUNTER — Encounter: Payer: Medicare Other | Admitting: Occupational Therapy

## 2016-02-10 ENCOUNTER — Encounter: Payer: Medicare Other | Admitting: Occupational Therapy

## 2016-02-14 ENCOUNTER — Encounter: Payer: Medicare Other | Admitting: Occupational Therapy

## 2016-02-15 ENCOUNTER — Ambulatory Visit: Payer: Medicare Other | Admitting: Diagnostic Neuroimaging

## 2016-02-15 NOTE — Progress Notes (Deleted)
Electrophysiology Office Note   Date:  02/15/2016   ID:  [name] Small, DOB 11/16/1928, MRN [id number]  PCP:  [name] Cowden, MD  Primary Electrophysiologist: [name] Verne [name] Leeds, MD    No chief complaint on file.    History of Present Illness: [name] Small is a 82 y.o. female who presents today for electrophysiology evaluation.   She is a 82 y.o. female with h/o PAF that was first diagnosed 7/18 with PAF. Has has a past history of a fall in 2/16 with subsequent hip fracture and had been on coumadin for a short period of time. She does have some issues with bradycardia when is SR. She is bradycardic at night when sleeping.  She was d/c on low dose carvedilol. She then had a ER visit 9/7 for another episode of afib with rvr at 160 bpm which left her feeling weak and sweaty.   She was started on Multaq and is currently tolerating without any further afib. Carvedilol was stopped with the addition of multaq due to concerns re bradycardia. Had pacemaker placed for SSS 11/2015.  Today, she denies symptoms of palpitations, chest pain, shortness of breath, orthopnea, PND, lower extremity edema, claudication, dizziness, presyncope, syncope, bleeding, or neurologic sequela. The patient is tolerating medications without difficulties and is otherwise without complaint today.    Past Medical History:  Diagnosis Date  . Atrial fibrillation ([place])   . Bleeding stomach ulcer 1980s?  [name] Small Cancer ([name] Small) Meloanoma left leg  . DIVERTICULOSIS, COLON 11/22/2006  . Headache(784.0)    "very often; not regular" (11/25/2015)  . History of blood transfusion 1980s   "when I had the bleeding ulcer"  . HYPERLIPIDEMIA 11/22/2006  . Hypertension   . Menopausal syndrome (hot flashes)   . OSTEOPOROSIS 11/22/2006  . Presence of permanent cardiac pacemaker    Past Surgical History:  Procedure Laterality Date  . APPENDECTOMY    . BUNIONECTOMY Right   . CATARACT EXTRACTION W/ INTRAOCULAR LENS  IMPLANT, BILATERAL  Bilateral 2000s  . CHOLECYSTECTOMY OPEN    . EP IMPLANTABLE DEVICE N/A 11/25/2015   Procedure: Pacemaker Implant;  Surgeon: [name] Salada [name] Leeds, MD;  Location: [facility];  Service: Cardiovascular;  Laterality: N/A;  . FEMUR IM NAIL Left 07/08/2014   Procedure: INTRAMEDULLARY (IM) NAIL ;  Surgeon: [name] Pel, MD;  [facility];  Service: Orthopedics;  Laterality: Left;  . FRACTURE SURGERY    . REVISION TOTAL HIP ARTHROPLASTY Left 06/2014     Current Outpatient Prescriptions  Medication Sig Dispense Refill  . acetaminophen (TYLENOL) 500 MG tablet Take 500 mg by mouth every 6 (six) hours as needed for mild pain or headache.     [name] Small amiodarone (PACERONE) 200 MG tablet Take 1 tablet (200 mg total) by mouth daily. 30 tablet 3  . atorvastatin (LIPITOR) 40 MG tablet TAKE 1 TABLET BY MOUTH DAILY (Patient taking differently: TAKE 40 MG BY MOUTH DAILY) 90 tablet 1  . Calcium Carbonate-Vitamin D (CALTRATE 600+D PO) Take 1 tablet by mouth daily.      [name] Small gabapentin (NEURONTIN) 100 MG capsule Take 1-3 capsules (100-300 mg total) by mouth 2 (two) times daily. 180 capsule 6  . Polyvinyl Alcohol-Povidone (REFRESH OP) Place 1 drop into both eyes 2 (two) times daily.    [name] Small 15 MG TABS tablet TAKE 1 TABLET(15 MG) BY MOUTH DAILY WITH DINNER 30 tablet 4   No current facility-administered medications for this visit.     Allergies:   Lisinopril  Social History:  The patient  reports that she has never smoked. She has never used smokeless tobacco. She reports that she does not drink alcohol or use drugs.   Family History:  The patient's family history includes Alzheimer's disease in her sister; Cardiomyopathy in her brother.    ROS:  Please see the history of present illness.  All other systems are reviewed and negative.    PHYSICAL EXAM: VS:  There were no vitals taken for this visit. , BMI There is no height or weight on file to calculate BMI. GEN: Well nourished, well developed, in  no acute distress  HEENT: normal  Neck: no JVD, carotid bruits, or masses Cardiac: RRR; no murmurs, rubs, or gallops,no edema  Respiratory:  clear to auscultation bilaterally, normal work of breathing GI: soft, nontender, nondistended, + BS MS: no deformity or atrophy  Skin: warm and dry Neuro:  Strength and sensation are intact Psych: euthymic mood, full affect  EKG:  EKG is ordered today. The ekg ordered today shows sinus rhythm, LAFB, incomplete RBBB, rate 51  Recent Labs: 03/09/2015: ALT 12 11/24/2015: B Natriuretic Peptide 519.9 12/27/2015: BUN 14; Creatinine, Ser 1.10; Hemoglobin 14.3; Platelets 216; Potassium 4.6; Sodium 141    Lipid Panel     Component Value Date/Time   CHOL 277 (H) 04/07/2014 0935   TRIG 114.0 04/07/2014 0935   HDL 62.00 04/07/2014 0935   CHOLHDL 4 04/07/2014 0935   VLDL 22.8 04/07/2014 0935   LDLCALC 192 (H) 04/07/2014 0935   LDLDIRECT 176.0 02/24/2008 1424     Wt Readings from Last 3 Encounters:  01/04/16 125 lb 9.6 oz (57 kg)  12/27/15 126 lb (57.2 kg)  12/22/15 125 lb 12.8 oz (57.1 kg)      Other studies Reviewed: Additional studies/ records that were reviewed today include: Holter 03/02/15  Review of the above records today demonstrates: Sinus rhythm sinus bradycardia PVC;s with slow bigeminny. If PVC;s not perfused effective HR as low as 24 bpm PAC;s and short bursts of atrial tachycardia less than 10 beats Average HR 54 bpm   TTE - Normal LV function; elevated LV filling pressure; mild LAE; traceAI, MR and TR.  ASSESSMENT AND PLAN:  1. PAF On Xarelto and multaq without recurrent symptoms.  [name] Small make no changes at this time.  This patients CHA2DS2-VASc Score and unadjusted Ischemic Stroke Rate (% per year) is equal to 4.8 % stroke rate/year from a score of 4  Above score calculated as 1 point each if present [CHF, HTN, DM, Vascular=MI/PAD/Aortic Plaque, Age if 65-74, or Female] Above score calculated as 2 points each if  present [Age > 75, or Stroke/TIA/TE]  2. Sick sinus syndrome S/p dual chamber pacemaker.  3. Hypertension:    Current medicines are reviewed at length with the patient today.   The patient does not have concerns regarding her medicines.  The following changes were made today:  none  Labs/ tests ordered today include: none  No orders of the defined types were placed in this encounter.    Disposition:   FU with AF clinic at next scheduled appointment  Signed, Aunna Snooks [name] Leeds, MD  02/15/2016 9:22 PM     [place] [address] [phone] (office) [phone] (fax)

## 2016-02-16 ENCOUNTER — Encounter: Payer: Medicare Other | Admitting: Cardiology

## 2016-02-20 IMAGING — CR DG CHEST 1V PORT
1 series · 1 of 1 positions shown · non-contrast
Comparison: 07/08/2014

CLINICAL DATA: Diaphoresis upon waking this morning. Sensation of
tachycardia.

EXAM:
PORTABLE CHEST - 1 VIEW

[AP]
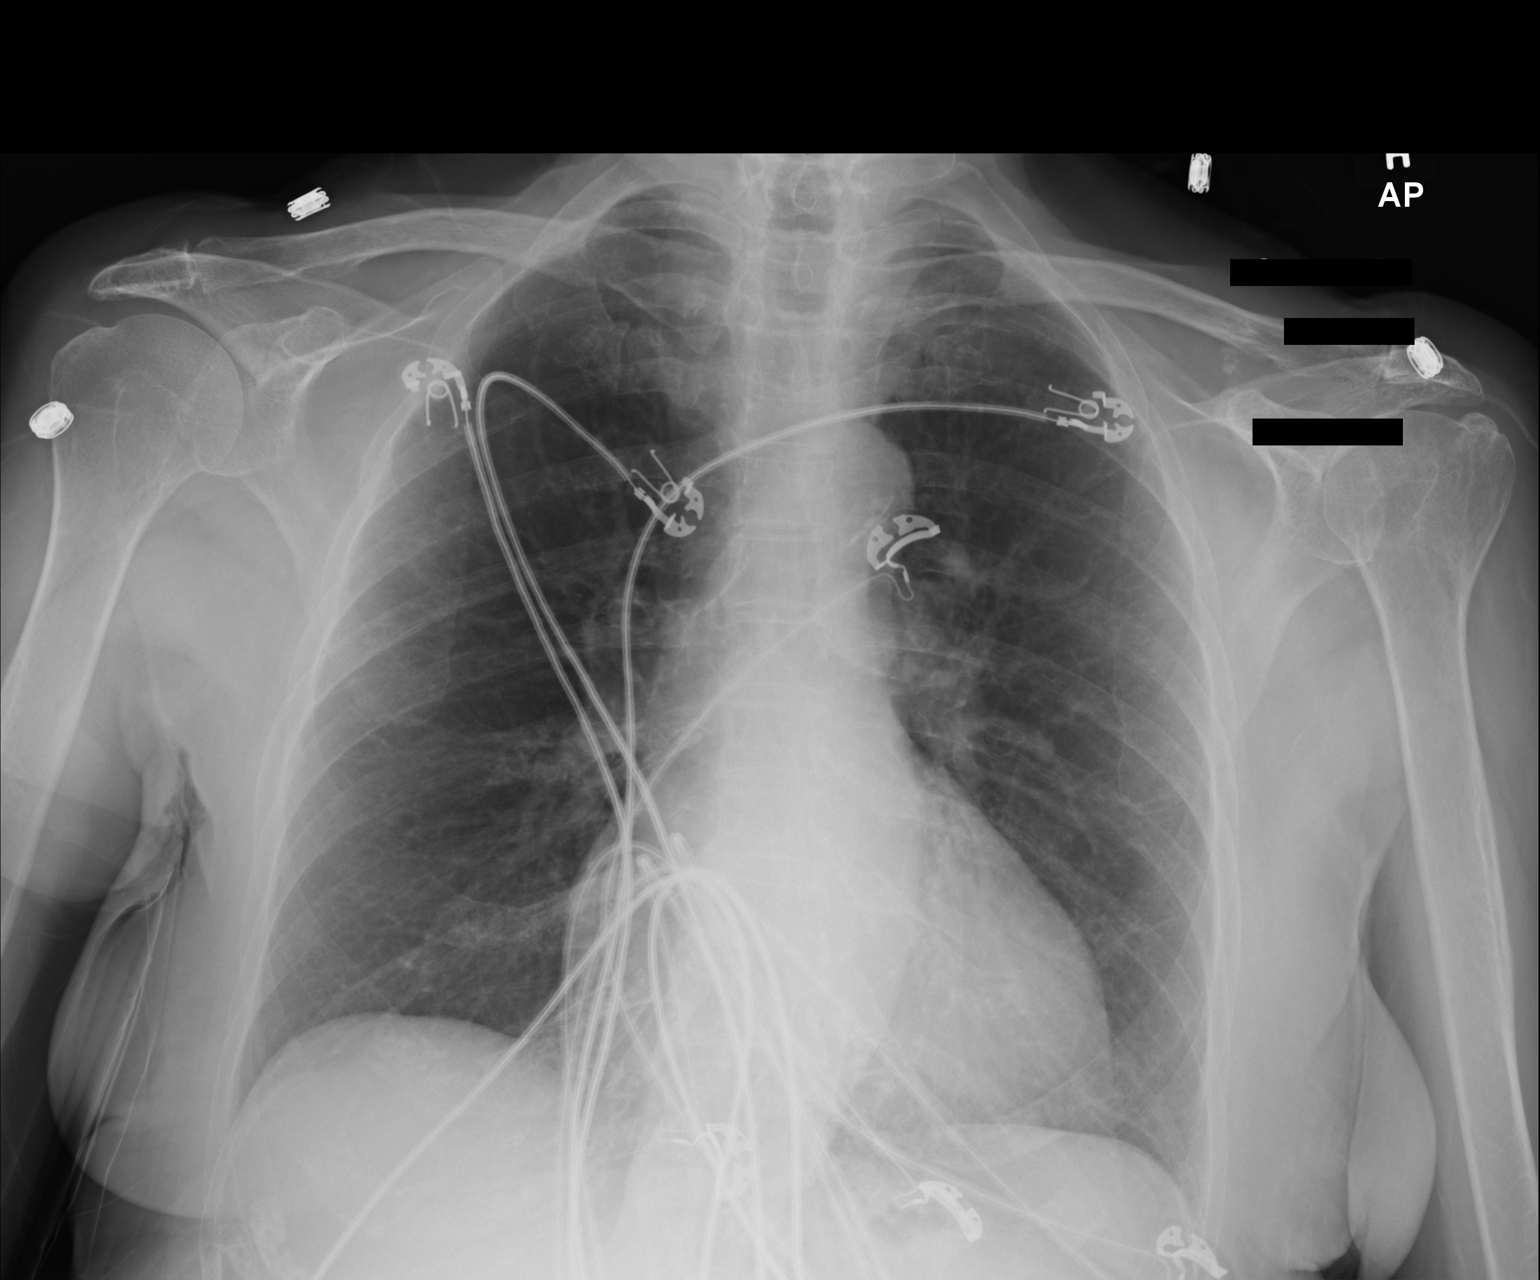

[1 of 1 positions shown; findings below may reference images not displayed]

FINDINGS: Artifact overlies the chest. Heart size is normal. There is
atherosclerosis of the aorta. The lungs are clear. The pulmonary
vascularity is normal. No effusions. No acute bone finding.
IMPRESSION: No active disease

## 2016-02-28 ENCOUNTER — Encounter (INDEPENDENT_AMBULATORY_CARE_PROVIDER_SITE_OTHER): Payer: Self-pay

## 2016-02-28 ENCOUNTER — Encounter: Payer: Self-pay | Admitting: Cardiology

## 2016-02-28 ENCOUNTER — Ambulatory Visit (INDEPENDENT_AMBULATORY_CARE_PROVIDER_SITE_OTHER): Payer: Medicare Other | Admitting: Cardiology

## 2016-02-28 VITALS — BP 162/74 | HR 74 | Ht 65.0 in | Wt 124.8 lb

## 2016-02-28 DIAGNOSIS — Z45018 Encounter for adjustment and management of other part of cardiac pacemaker: Secondary | ICD-10-CM | POA: Diagnosis not present

## 2016-02-28 DIAGNOSIS — I495 Sick sinus syndrome: Secondary | ICD-10-CM

## 2016-02-28 LAB — CUP PACEART INCLINIC DEVICE CHECK
Battery Voltage: 3.05 V
Brady Statistic AS VP Percent: 0 %
Date Time Interrogation Session: 20171009104442
Implantable Lead Implant Date: 20170706
Implantable Lead Location: 753859
Implantable Lead Model: 5076
Lead Channel Impedance Value: 399 Ohm
Lead Channel Impedance Value: 399 Ohm
Lead Channel Impedance Value: 551 Ohm
Lead Channel Pacing Threshold Pulse Width: 0.4 ms
Lead Channel Sensing Intrinsic Amplitude: 1.875 mV
Lead Channel Setting Pacing Amplitude: 2.5 V
Lead Channel Setting Sensing Sensitivity: 2.8 mV
MDC IDC LEAD IMPLANT DT: 20170706
MDC IDC LEAD LOCATION: 753860
MDC IDC MSMT BATTERY REMAINING LONGEVITY: 131 mo
MDC IDC MSMT LEADCHNL RA PACING THRESHOLD AMPLITUDE: 0.75 V
MDC IDC MSMT LEADCHNL RA PACING THRESHOLD PULSEWIDTH: 0.4 ms
MDC IDC MSMT LEADCHNL RV IMPEDANCE VALUE: 475 Ohm
MDC IDC MSMT LEADCHNL RV PACING THRESHOLD AMPLITUDE: 0.625 V
MDC IDC MSMT LEADCHNL RV SENSING INTR AMPL: 22.625 mV
MDC IDC SET LEADCHNL RA PACING AMPLITUDE: 1.5 V
MDC IDC SET LEADCHNL RV PACING PULSEWIDTH: 0.4 ms
MDC IDC STAT BRADY AP VP PERCENT: 0.04 %
MDC IDC STAT BRADY AP VS PERCENT: 90.64 %
MDC IDC STAT BRADY AS VS PERCENT: 9.32 %
MDC IDC STAT BRADY RA PERCENT PACED: 90.68 %
MDC IDC STAT BRADY RV PERCENT PACED: 0.04 %

## 2016-02-28 NOTE — Progress Notes (Signed)
Electrophysiology Office Note   Date:  02/28/2016   ID:  [name] Small, DOB 1928/08/10, MRN [id number]  PCP:  Nyoka Cowden, MD  Primary Electrophysiologist: Carollee Nussbaumer Meredith Leeds, MD    Chief Complaint  Patient presents with  . Pacemaker Check     History of Present Illness: [name] Small is a 82 y.o. female who presents today for electrophysiology evaluation.   She is a 82 y.o. female with h/o PAF that was first diagnosed 7/18 with PAF. Has has a past history of a fall in 2/16 with subsequent hip fracture and had been on coumadin for a short period of time. She does have some issues with bradycardia when is SR. She is bradycardic at night when sleeping.  She was d/c on low dose carvedilol. She then had a ER visit 9/7 for another episode of afib with rvr at 160 bpm which left her feeling weak and sweaty.   Pacemaker placed 11/25/15. She says that she had a bad headache and had to go to the emergency room for age. She had MRIs and CAT scans done which showed no evidence of abnormality. She stopped her Xarelto for 2 days, the headache went away, and she restarted the Xarelto at half dose. She has not had a return of her headaches since that time.  Today, she denies symptoms of palpitations, chest pain, shortness of breath, orthopnea, PND, lower extremity edema, claudication, dizziness, presyncope, syncope, bleeding, or neurologic sequela. The patient is tolerating medications without difficulties and is otherwise without complaint today.    Past Medical History:  Diagnosis Date  . Atrial fibrillation ([place])   . Bleeding stomach ulcer 1980s?  [name] Cancer ([place]) Meloanoma left leg  . DIVERTICULOSIS, COLON 11/22/2006  . Headache(784.0)    "very often; not regular" (11/25/2015)  . History of blood transfusion 1980s   "when I had the bleeding ulcer"  . HYPERLIPIDEMIA 11/22/2006  . Hypertension   . Menopausal syndrome (hot flashes)   . OSTEOPOROSIS 11/22/2006  . Presence of permanent cardiac  pacemaker    Past Surgical History:  Procedure Laterality Date  . APPENDECTOMY    . BUNIONECTOMY Right   . CATARACT EXTRACTION W/ INTRAOCULAR LENS  IMPLANT, BILATERAL Bilateral 2000s  . CHOLECYSTECTOMY OPEN    . EP IMPLANTABLE DEVICE N/A 11/25/2015   Procedure: Pacemaker Implant;  Surgeon: Alexande Sheerin Meredith Leeds, MD;  Location: [facility];  Service: Cardiovascular;  Laterality: N/A;  . FEMUR IM NAIL Left 07/08/2014   Procedure: INTRAMEDULLARY (IM) NAIL ;  Surgeon: Meredith Pel, MD;  Location: [facility];  Service: Orthopedics;  Laterality: Left;  . FRACTURE SURGERY    . REVISION TOTAL HIP ARTHROPLASTY Left 06/2014     Current Outpatient Prescriptions  Medication Sig Dispense Refill  . acetaminophen (TYLENOL) 500 MG tablet Take 500 mg by mouth every 6 (six) hours as needed for mild pain or headache.     [name] amiodarone (PACERONE) 200 MG tablet Take 1 tablet (200 mg total) by mouth daily. 30 tablet 3  . atorvastatin (LIPITOR) 40 MG tablet TAKE 1 TABLET BY MOUTH DAILY (Patient taking differently: TAKE 40 MG BY MOUTH DAILY) 90 tablet 1  . Calcium Carbonate-Vitamin D (CALTRATE 600+D PO) Take 1 tablet by mouth daily.      [name] gabapentin (NEURONTIN) 100 MG capsule Take 1-3 capsules (100-300 mg total) by mouth 2 (two) times daily. 180 capsule 6  . Polyvinyl Alcohol-Povidone (REFRESH OP) Place 1 drop into both eyes 2 (two) times  daily.    Alveda Reasons 15 MG TABS tablet TAKE 1 TABLET(15 MG) BY MOUTH DAILY WITH DINNER 30 tablet 4   No current facility-administered medications for this visit.     Allergies:   Lisinopril   Social History:  The patient  reports that she has never smoked. She has never used smokeless tobacco. She reports that she does not drink alcohol or use drugs.   Family History:  The patient's family history includes Alzheimer's disease in her sister; Cardiomyopathy in her brother.    ROS:  Please see the history of present illness.  All other systems are reviewed and positive  for balance problems.    PHYSICAL EXAM: VS:  BP (!) 162/74   Pulse 74   Ht 5\' 5"  (1.651 m)   Wt 124 lb 12.8 oz (56.6 kg)   BMI 20.77 kg/m  , BMI Body mass index is 20.77 kg/m. GEN: Well nourished, well developed, in no acute distress  HEENT: normal  Neck: no JVD, carotid bruits, or masses Cardiac: RRR; no murmurs, rubs, or gallops,no edema  Respiratory:  clear to auscultation bilaterally, normal work of breathing GI: soft, nontender, nondistended, + BS MS: no deformity or atrophy  Skin: warm and dry Neuro:  Strength and sensation are intact Psych: euthymic mood, full affect  EKG:  EKG is ordered today. The ekg ordered today shows A paced, iRBBB, LAFB  Recent Labs: 03/09/2015: ALT 12 11/24/2015: B Natriuretic Peptide 519.9 12/27/2015: BUN 14; Creatinine, Ser 1.10; Hemoglobin 14.3; Platelets 216; Potassium 4.6; Sodium 141    Lipid Panel     Component Value Date/Time   CHOL 277 (H) 04/07/2014 0935   TRIG 114.0 04/07/2014 0935   HDL 62.00 04/07/2014 0935   CHOLHDL 4 04/07/2014 0935   VLDL 22.8 04/07/2014 0935   LDLCALC 192 (H) 04/07/2014 0935   LDLDIRECT 176.0 02/24/2008 1424     Wt Readings from Last 3 Encounters:  02/28/16 124 lb 12.8 oz (56.6 kg)  01/04/16 125 lb 9.6 oz (57 kg)  12/27/15 126 lb (57.2 kg)      Other studies Reviewed: Additional studies/ records that were reviewed today include: Holter 03/02/15  Review of the above records today demonstrates: Sinus rhythm sinus bradycardia PVC;s with slow bigeminny. If PVC;s not perfused effective HR as low as 24 bpm PAC;s and short bursts of atrial tachycardia less than 10 beats Average HR 54 bpm   TTE - Normal LV function; elevated LV filling pressure; mild LAE; traceAI, MR and TR.  ASSESSMENT AND PLAN:  1. PAF On Xarelto and multaq without recurrent symptoms.  I have asked her to start taking her Xarelto at full dose and if she has return of her headaches to call us back. She did not tolerate Eliquis in  the past, we Kalief Kattner likely switch her to Pradaxa  2. SSS: Pacemaker placed 11/25/15.  3. Hypertension: Elevated on initial check today. Virat Prather have her call back with home BP recordings in one week.   4. Hyperlipidemia:Followed by PCP, on a statin   Current medicines are reviewed at length with the patient today.   The patient does not have concerns regarding her medicines.  The following changes were made today:  none  Labs/ tests ordered today include: none  No orders of the defined types were placed in this encounter.    Disposition:   FU with AF clinic at next scheduled appointment  Signed, Jamaris Theard Meredith Leeds, MD  02/28/2016 10:09 AM     [facility]  [address] [place] [address] [address] [phone] (office) [phone] (fax)

## 2016-02-28 NOTE — Patient Instructions (Addendum)
Medication Instructions:    Your physician recommends that you continue on your current medications as directed. Please refer to the Current Medication list given to you today.  --- If you need a refill on your cardiac medications before your next appointment, please call your pharmacy. ---  Labwork:  None ordered  Testing/Procedures:  None ordered  Follow-Up:  Your physician wants you to follow-up in: 6 months with Dr. Curt Bears.  You will receive a reminder letter in the mail two months in advance. If you don't receive a letter, please call our office to schedule the follow-up appointment.   Any Other Special Instructions Will Be Listed Below (If Applicable).  Remote monitoring is used to monitor your Pacemaker of ICD from home. This monitoring reduces the number of office visits required to check your device to one time per year. It allows [place] to keep an eye on the functioning of your device to ensure it is working properly. You are scheduled for a device check from home on 05/29/2016. You may send your transmission at any time that day. If you have a wireless device, the transmission will be sent automatically. After your physician reviews your transmission, you will receive a postcard with your next transmission date.   Keep a record of your blood pressures over the next several weeks.  Please call Trinidad Curet, RN  to report your blood pressures.   Thank you for choosing [facility]!!   Trinidad Curet, RN [phone]

## 2016-02-29 ENCOUNTER — Ambulatory Visit (INDEPENDENT_AMBULATORY_CARE_PROVIDER_SITE_OTHER): Payer: Medicare Other

## 2016-02-29 DIAGNOSIS — Z23 Encounter for immunization: Secondary | ICD-10-CM

## 2016-03-06 ENCOUNTER — Ambulatory Visit (INDEPENDENT_AMBULATORY_CARE_PROVIDER_SITE_OTHER): Payer: Medicare Other | Admitting: Diagnostic Neuroimaging

## 2016-03-06 ENCOUNTER — Encounter: Payer: Self-pay | Admitting: Diagnostic Neuroimaging

## 2016-03-06 DIAGNOSIS — M5481 Occipital neuralgia: Secondary | ICD-10-CM | POA: Diagnosis not present

## 2016-03-06 DIAGNOSIS — M79601 Pain in right arm: Secondary | ICD-10-CM | POA: Diagnosis not present

## 2016-03-06 NOTE — Progress Notes (Signed)
[facility]  PATIENT: [name] Small DOB: 01/05/1929  REFERRING CLINICIAN: Kwiatowski HISTORY FROM: patient REASON FOR VISIT: follow up   HISTORICAL  CHIEF COMPLAINT:  Chief Complaint  Patient presents with  . Occipital neuralgia L side    rm 6, completed OT; stopped Gabapentin approx 3 wks ago- headaches gone"  . Follow-up    6 week    HISTORY OF PRESENT ILLNESS:   UPDATE 03/06/16: Since last visit, doing much better. Tried gabapentin for 2-3 weeks, then pain improved. Now off gabapentin and doing well. Right shoulder is better also. OT completed.   PRIOR HPI (01/04/16): 82 year old female here for evaluation of headaches. For past 1 month patient has onset of left occipital throbbing headache pain lasting 30 minutes or longer at a time. No nausea or vomiting, photophobia or phonophobia. Sometimes this is associated with right proximal arm pain. She denies any history of similar headaches, migraines, falls, accidents or injuries. Headaches occurring at least twice per day. Patient has been prescribed gabapentin 100 mg at bedtime which slightly helps. Sometimes she takes a second 100 mg tablet in the daytime. Patient has history of hypercholesteremia, atrial fibrillation, chronic anticoagulation, and went to the emergency room recently for these headaches, had CT scan of the head which is unremarkable (12/27/15). Patient goes to sleep at 9 PM and wakes up at 5:30 AM. She tends to wake up multiple times at night. Headaches have been interrupting her sleep. Also of note in the last 2 months patient has had increasing problems with malaise, fatigue, weakness, bradycardia, requiring pacemaker placement in July 2017. Also of note patient had a fall and hip fracture on the left side in February 2016.   REVIEW OF SYSTEMS: Full 14 system review of systems performed and negative with exception of: Headache sleepiness not asleep.  ALLERGIES: Allergies  Allergen Reactions  .  Lisinopril Other (See Comments)    Cough    HOME MEDICATIONS: Outpatient Medications Prior to Visit  Medication Sig Dispense Refill  . acetaminophen (TYLENOL) 500 MG tablet Take 500 mg by mouth every 6 (six) hours as needed for mild pain or headache.     [name] amiodarone (PACERONE) 200 MG tablet Take 1 tablet (200 mg total) by mouth daily. 30 tablet 3  . atorvastatin (LIPITOR) 40 MG tablet TAKE 1 TABLET BY MOUTH DAILY (Patient taking differently: TAKE 40 MG BY MOUTH DAILY) 90 tablet 1  . Calcium Carbonate-Vitamin D (CALTRATE 600+D PO) Take 1 tablet by mouth daily.      . Polyvinyl Alcohol-Povidone (REFRESH OP) Place 1 drop into both eyes 2 (two) times daily.    Alveda Reasons 15 MG TABS tablet TAKE 1 TABLET(15 MG) BY MOUTH DAILY WITH DINNER 30 tablet 4  . gabapentin (NEURONTIN) 100 MG capsule Take 1-3 capsules (100-300 mg total) by mouth 2 (two) times daily. (Patient not taking: Reported on 03/06/2016) 180 capsule 6   No facility-administered medications prior to visit.     PAST MEDICAL HISTORY: Past Medical History:  Diagnosis Date  . Atrial fibrillation ([place])   . Bleeding stomach ulcer 1980s?  [name] Cancer ([place]) Meloanoma left leg  . DIVERTICULOSIS, COLON 11/22/2006  . Headache(784.0)    "very often; not regular" (11/25/2015)  . History of blood transfusion 1980s   "when I had the bleeding ulcer"  . HYPERLIPIDEMIA 11/22/2006  . Hypertension   . Menopausal syndrome (hot flashes)   . OSTEOPOROSIS 11/22/2006  . Presence of permanent cardiac pacemaker     PAST  SURGICAL HISTORY: Past Surgical History:  Procedure Laterality Date  . APPENDECTOMY    . BUNIONECTOMY Right   . CATARACT EXTRACTION W/ INTRAOCULAR LENS  IMPLANT, BILATERAL Bilateral 2000s  . CHOLECYSTECTOMY OPEN    . EP IMPLANTABLE DEVICE N/A 11/25/2015   Procedure: Pacemaker Implant;  Surgeon: Will Meredith Leeds, MD;  Location: [facility];  Service: Cardiovascular;  Laterality: N/A;  . FEMUR IM NAIL Left 07/08/2014   Procedure:  INTRAMEDULLARY (IM) NAIL ;  Surgeon: Meredith Pel, MD;  Location: [facility];  Service: Orthopedics;  Laterality: Left;  . FRACTURE SURGERY    . REVISION TOTAL HIP ARTHROPLASTY Left 06/2014    FAMILY HISTORY: Family History  Problem Relation Age of Onset  . Alzheimer's disease Sister   . Cardiomyopathy Brother     SOCIAL HISTORY:  Social History   Social History  . Marital status: Widowed    Spouse name: N/A  . Number of children: 2  . Years of education: 12   Occupational History  .      retired   Social History Main Topics  . Smoking status: Never Smoker  . Smokeless tobacco: Never Used  . Alcohol use No  . Drug use: No  . Sexual activity: No   Other Topics Concern  . Not on file   Social History Narrative   Lives alone, son lives beside her   Caffeine - none     PHYSICAL EXAM   GENERAL EXAM/CONSTITUTIONAL: Vitals:  Vitals:   03/06/16 1208  BP: (!) 164/82  Pulse: 61  Weight: 126 lb (57.2 kg)   Body mass index is 20.97 kg/m. No exam data present  Patient is in no distress; well developed, nourished and groomed; neck is supple  NO OCCIPITAL NERVE TENDERNESS  MASKED FACIES; FLAT AFFECT  CARDIOVASCULAR:  Examination of carotid arteries is normal; no carotid bruits  Regular rate and rhythm, no murmurs  Examination of peripheral vascular system by observation and palpation is normal  EYES:  Ophthalmoscopic exam of optic discs and posterior segments is normal; no papilledema or hemorrhages  MUSCULOSKELETAL:  Gait, strength, tone, movements noted in Neurologic exam below  NEUROLOGIC: MENTAL STATUS:  No flowsheet data found.  awake, alert, oriented to person, place and time  recent and remote memory intact  normal attention and concentration  language fluent, comprehension intact, naming intact,   fund of knowledge appropriate  CRANIAL NERVE:   2nd - no papilledema on fundoscopic exam  2nd, 3rd, 4th, 6th - pupils equal and  reactive to light, visual fields full to confrontation, extraocular muscles intact, no nystagmus  5th - facial sensation symmetric  7th - facial strength symmetric  8th - hearing intact  9th - palate elevates symmetrically, uvula midline  11th - shoulder shrug symmetric  12th - tongue protrusion midline  MOTOR:   normal bulk and tone, full strength in the BUE, BLE  SUBTLE RESTING TREMOR IN LEFT HAND  CLICKS AND POPS WITH RIGHT SHOULDER PASSIVE AND ACTIVE ROM  SENSORY:   normal and symmetric to light touch, temperature, vibration; ABSENT VIBRATION AT TOES  COORDINATION:   finger-nose-finger, fine finger movements normal  REFLEXES:   deep tendon reflexes TRACE and symmetric  GAIT/STATION:   SLOW CAUTIOUS, narrow based gait; UNSTEADY    DIAGNOSTIC DATA (LABS, IMAGING, TESTING) - I reviewed patient records, labs, notes, testing and imaging myself where available.  Lab Results  Component Value Date   WBC 5.9 12/27/2015   HGB 14.3 12/27/2015  HCT 42.0 12/27/2015   MCV 92.0 12/27/2015   PLT 216 12/27/2015      Component Value Date/Time   NA 141 12/27/2015 1119   NA 138 07/15/2014   K 4.6 12/27/2015 1119   CL 102 12/27/2015 1119   CO2 28 12/27/2015 1105   GLUCOSE 91 12/27/2015 1119   BUN 14 12/27/2015 1119   BUN 20 07/15/2014   CREATININE 1.10 (H) 12/27/2015 1119   CALCIUM 9.8 12/27/2015 1105   PROT 7.0 03/09/2015 1140   ALBUMIN 3.6 03/09/2015 1140   AST 15 03/09/2015 1140   ALT 12 (L) 03/09/2015 1140   ALKPHOS 75 03/09/2015 1140   BILITOT 0.5 03/09/2015 1140   GFRNONAA 41 (L) 12/27/2015 1105   GFRAA 47 (L) 12/27/2015 1105   Lab Results  Component Value Date   CHOL 277 (H) 04/07/2014   HDL 62.00 04/07/2014   LDLCALC 192 (H) 04/07/2014   LDLDIRECT 176.0 02/24/2008   TRIG 114.0 04/07/2014   CHOLHDL 4 04/07/2014   Lab Results  Component Value Date   HGBA1C 6.1 (H) 12/07/2014   No results found for: VITAMINB12 Lab Results  Component Value  Date   TSH 1.019 12/07/2014    12/08/15 EKG - Atrial-paced rhythm - Incomplete right bundle branch block - Left anterior fascicular block - Abnormal ECG - Atrial bigmeiny no longer present  12/27/15 CT head [I reviewed images myself and agree with interpretation. -VRP]  1. No acute intracranial abnormalities. 2. Chronic microvascular disease and brain atrophy.  12/27/15 CTA head/neck [I reviewed images myself and agree with interpretation. -VRP]  1. No acute finding including vertebral dissection. 2. Cervical carotid atherosclerosis with approximately 50% proximal left ICA stenosis. 3. Intracranial atherosclerosis, most notably moderate left M1 and left proximal PCA stenoses.  01/19/16 MRI brain [I reviewed images myself and agree with interpretation. -VRP]  1. No acute or focal abnormality explain the patient's symptoms. 2. Moderate generalized atrophy and white matter disease. This likely reflects the sequela of chronic microvascular ischemia.     ASSESSMENT AND PLAN  82 y.o. year old female here with new onset left occipital headache with right arm pain in August 2017. Could represent new onset of migraine phenomenon although this would be unusual at patient's age without prior history of migraine. Cervicogenic headache or other headache is possible. Patient does have a new pacemaker placed in July 2017 which is MRI conditional.   Now symptoms are spontaneously resolved; OT and gabapentin may have helped.    Dx:  1. Occipital neuralgia of left side   2. Right arm pain      PLAN: - monitor symptoms  Return for return to PCP.    Penni Bombard, MD [id number], [id number] PM Certified in Neurology, Neurophysiology and Neuroimaging  [facility] [address] [place], [place] [address] [phone]

## 2016-05-02 ENCOUNTER — Encounter: Payer: Self-pay | Admitting: Internal Medicine

## 2016-05-02 ENCOUNTER — Ambulatory Visit (INDEPENDENT_AMBULATORY_CARE_PROVIDER_SITE_OTHER): Payer: Medicare Other | Admitting: Internal Medicine

## 2016-05-02 VITALS — BP 132/66 | HR 65 | Temp 97.8°F | Ht 64.5 in | Wt 126.0 lb

## 2016-05-02 DIAGNOSIS — Z0000 Encounter for general adult medical examination without abnormal findings: Secondary | ICD-10-CM

## 2016-05-02 NOTE — Progress Notes (Signed)
Subjective:    Patient ID: [name] Small, female    DOB: December 02, 1928, 82 y.o.   MRN: [id number]  HPI  82 year old patient who is seen today for a preventive health examination and annual Medicare wellness visit. She continues to do well.  She is status post pacemaker implantation for tachybradycardia syndrome in July of this year and followed closely by cardiology. Doing quite well She remains on chronic anticoagulation. She has a history of dyslipidemia and remains on atorvastatin. She has a history of essential hypertension.  Presently controlled off medications  No new concerns or complaints  Past Medical History:  Diagnosis Date  . Atrial fibrillation ([place])   . Bleeding stomach ulcer 1980s?  [name] Cancer ([place]) Meloanoma left leg  . DIVERTICULOSIS, COLON 11/22/2006  . Headache(784.0)    "very often; not regular" (11/25/2015)  . History of blood transfusion 1980s   "when I had the bleeding ulcer"  . HYPERLIPIDEMIA 11/22/2006  . Hypertension   . Menopausal syndrome (hot flashes)   . OSTEOPOROSIS 11/22/2006  . Presence of permanent cardiac pacemaker      Social History   Social History  . Marital status: Widowed    Spouse name: N/A  . Number of children: 2  . Years of education: 12   Occupational History  .      retired   Social History Main Topics  . Smoking status: Never Smoker  . Smokeless tobacco: Never Used  . Alcohol use No  . Drug use: No  . Sexual activity: No   Other Topics Concern  . Not on file   Social History Narrative   Lives alone, son lives beside her   Caffeine - none    Past Surgical History:  Procedure Laterality Date  . APPENDECTOMY    . BUNIONECTOMY Right   . CATARACT EXTRACTION W/ INTRAOCULAR LENS  IMPLANT, BILATERAL Bilateral 2000s  . CHOLECYSTECTOMY OPEN    . EP IMPLANTABLE DEVICE N/A 11/25/2015   Procedure: Pacemaker Implant;  Surgeon: Will Meredith Leeds, MD;  Location: [facility];  Service: Cardiovascular;  Laterality: N/A;  .  FEMUR IM NAIL Left 07/08/2014   Procedure: INTRAMEDULLARY (IM) NAIL ;  Surgeon: Meredith Pel, MD;  Location: [facility];  Service: Orthopedics;  Laterality: Left;  . FRACTURE SURGERY    . REVISION TOTAL HIP ARTHROPLASTY Left 06/2014    Family History  Problem Relation Age of Onset  . Alzheimer's disease Sister   . Cardiomyopathy Brother     Allergies  Allergen Reactions  . Lisinopril Other (See Comments)    Cough    Current Outpatient Prescriptions on File Prior to Visit  Medication Sig Dispense Refill  . acetaminophen (TYLENOL) 500 MG tablet Take 500 mg by mouth every 6 (six) hours as needed for mild pain or headache.     [name] amiodarone (PACERONE) 200 MG tablet Take 1 tablet (200 mg total) by mouth daily. 30 tablet 3  . atorvastatin (LIPITOR) 40 MG tablet TAKE 1 TABLET BY MOUTH DAILY (Patient taking differently: TAKE 40 MG BY MOUTH DAILY) 90 tablet 1  . Calcium Carbonate-Vitamin D (CALTRATE 600+D PO) Take 1 tablet by mouth daily.      [name] gabapentin (NEURONTIN) 100 MG capsule Take 1-3 capsules (100-300 mg total) by mouth 2 (two) times daily. 180 capsule 6  . Polyvinyl Alcohol-Povidone (REFRESH OP) Place 1 drop into both eyes 2 (two) times daily.    Alveda Reasons 15 MG TABS tablet TAKE 1 TABLET(15 MG) BY  MOUTH DAILY WITH DINNER 30 tablet 4  . [DISCONTINUED] dronedarone (MULTAQ) 400 MG tablet Take 1 tablet (400 mg total) by mouth daily.     No current facility-administered medications on file prior to visit.     BP 132/66 (BP Location: Left Arm, Patient Position: Sitting, Cuff Size: Normal)   Pulse 65   Temp 97.8 F (36.6 C) (Oral)   Ht 5' 4.5" (1.638 m)   Wt 126 lb (57.2 kg)   SpO2 98%   BMI 21.29 kg/m   Medicare wellness visit  1. Risk factors, based on past  M,S,F history.  Cardiovascular risk factors include hypertension and dyslipidemia.  Patient has a history of atrial fibrillation  2.  Physical activities:tries to walk daily.  Uses a cane  3.  Depression/mood:no history  of major depression or mood disorder 4.  Hearing: no major deficits  5.  ADL's:independent  6.  Fall risk:moderate.  Walks with a cane 7.  Home safety:no problems identified  8.  Height weight, and visual acuity;height and weight stable no change in visual acuity is scheduled to see ophthalmology next week  9.  Counseling:continue heart healthy diet and active lifestyle  10. Lab orders based on risk factors:laboratory studies reviewed from earlier hospital admission  11. Referral :follow-up cardiology 12. Care plan:continue efforts at aggressive risk factor modification.  Continue statin therapy  13. Cognitive assessment: alert and appropriate with normal affect.  No cognitive dysfunction  14. Screening: Patient provided with a written and personalized 5-10 year screening schedule in the AVS.    15. Provider List Update: primary care cardiology, ophthalmology    Review of Systems  Constitutional: Negative.   HENT: Negative for congestion, dental problem, hearing loss, rhinorrhea, sinus pressure, sore throat and tinnitus.   Eyes: Negative for pain, discharge and visual disturbance.  Respiratory: Negative for cough and shortness of breath.   Cardiovascular: Negative for chest pain, palpitations and leg swelling.  Gastrointestinal: Negative for abdominal distention, abdominal pain, blood in stool, constipation, diarrhea, nausea and vomiting.  Genitourinary: Negative for difficulty urinating, dysuria, flank pain, frequency, hematuria, pelvic pain, urgency, vaginal bleeding, vaginal discharge and vaginal pain.  Musculoskeletal: Positive for arthralgias and gait problem. Negative for joint swelling.       Left hip discomfort Walks with a cane  Skin: Negative for rash.  Neurological: Negative for dizziness, syncope, speech difficulty, weakness, numbness and headaches.  Hematological: Negative for adenopathy.  Psychiatric/Behavioral: Negative for agitation, behavioral problems and  dysphoric mood. The patient is not nervous/anxious.        Objective:   Physical Exam  Constitutional: She is oriented to person, place, and time. She appears well-developed and well-nourished. No distress.  Elderly No distress Normal blood pressure Weight 126  HENT:  Head: Normocephalic and atraumatic.  Right Ear: External ear normal.  Left Ear: External ear normal.  Mouth/Throat: Oropharynx is clear and moist.  Eyes: Conjunctivae and EOM are normal.  Neck: Normal range of motion. Neck supple. No JVD present. No thyromegaly present.  Cardiovascular: Normal rate, regular rhythm, normal heart sounds and intact distal pulses.   No murmur heard. Rhythm is regular No murmur  Dorsalis pedis pulses full Posterior tibial pulses faint  Pulmonary/Chest: Effort normal and breath sounds normal. She has no wheezes. She has no rales.  Pacemaker implantation, left upper anterior chest wall  Abdominal: Soft. Bowel sounds are normal. She exhibits no distension and no mass. There is no tenderness. There is no rebound and no guarding.  Genitourinary:  Vagina normal.  Musculoskeletal: Normal range of motion. She exhibits no edema or tenderness.  Neurological: She is alert and oriented to person, place, and time. She has normal reflexes. No cranial nerve deficit. She exhibits normal muscle tone. Coordination normal.  Skin: Skin is warm and dry. No rash noted.  Psychiatric: She has a normal mood and affect. Her behavior is normal.          Assessment & Plan:   Preventive health examination Annual Medicare wellness visit History of essential hypertension.  Blood pressure well controlled off medications.  We'll continue to observe Paroxysmal atrial fibrillation/tachybradycardia syndrome.  Follow-up cardiology.  Continue anticoagulation Osteoarthritis  Follow-up 6 months or as needed , Cardiology and ophthalmology follow-up as scheduled  Nyoka Cowden

## 2016-05-02 NOTE — Patient Instructions (Signed)
Limit your sodium (Salt) intake    It is important that you walk regularly, at least 20 minutes 3 to 4 times per week.  If you develop chest pain or shortness of breath seek  medical attention.  Please check your blood pressure on a regular basis.  If it is consistently greater than 150/90, please make an office appointment.  Return in 6 months for follow-up  Cardiology follow-up as scheduled

## 2016-05-02 NOTE — Progress Notes (Signed)
Pre visit review using our clinic review tool, if applicable. No additional management support is needed unless otherwise documented below in the visit note. 

## 2016-05-08 DIAGNOSIS — H35341 Macular cyst, hole, or pseudohole, right eye: Secondary | ICD-10-CM | POA: Diagnosis not present

## 2016-05-08 DIAGNOSIS — H35353 Cystoid macular degeneration, bilateral: Secondary | ICD-10-CM | POA: Diagnosis not present

## 2016-05-29 ENCOUNTER — Telehealth: Payer: Self-pay | Admitting: Cardiology

## 2016-05-29 ENCOUNTER — Ambulatory Visit (INDEPENDENT_AMBULATORY_CARE_PROVIDER_SITE_OTHER): Payer: Medicare Other | Admitting: *Deleted

## 2016-05-29 DIAGNOSIS — I495 Sick sinus syndrome: Secondary | ICD-10-CM | POA: Diagnosis not present

## 2016-05-29 NOTE — Telephone Encounter (Signed)
Spoke with pt and reminded pt of remote transmission that is due today. Pt verbalized understanding.   

## 2016-05-30 NOTE — Progress Notes (Signed)
Remote pacemaker transmission.   

## 2016-05-31 ENCOUNTER — Encounter: Payer: Self-pay | Admitting: Cardiology

## 2016-05-31 LAB — CUP PACEART REMOTE DEVICE CHECK
Brady Statistic AP VS Percent: 95.5 %
Brady Statistic AS VP Percent: 0 %
Brady Statistic AS VS Percent: 4.45 %
Implantable Lead Implant Date: 20170706
Implantable Lead Location: 753859
Implantable Lead Location: 753860
Implantable Lead Model: 5076
Lead Channel Impedance Value: 437 Ohm
Lead Channel Impedance Value: 589 Ohm
Lead Channel Pacing Threshold Amplitude: 0.75 V
Lead Channel Pacing Threshold Pulse Width: 0.4 ms
Lead Channel Sensing Intrinsic Amplitude: 1.125 mV
Lead Channel Sensing Intrinsic Amplitude: 22.25 mV
Lead Channel Sensing Intrinsic Amplitude: 22.25 mV
Lead Channel Setting Pacing Amplitude: 2.5 V
Lead Channel Setting Pacing Pulse Width: 0.4 ms
MDC IDC LEAD IMPLANT DT: 20170706
MDC IDC MSMT BATTERY REMAINING LONGEVITY: 121 mo
MDC IDC MSMT BATTERY VOLTAGE: 3.03 V
MDC IDC MSMT LEADCHNL RA PACING THRESHOLD AMPLITUDE: 0.875 V
MDC IDC MSMT LEADCHNL RA PACING THRESHOLD PULSEWIDTH: 0.4 ms
MDC IDC MSMT LEADCHNL RA SENSING INTR AMPL: 1.125 mV
MDC IDC MSMT LEADCHNL RV IMPEDANCE VALUE: 361 Ohm
MDC IDC MSMT LEADCHNL RV IMPEDANCE VALUE: 475 Ohm
MDC IDC PG IMPLANT DT: 20170706
MDC IDC SESS DTM: 20180108210636
MDC IDC SET LEADCHNL RA PACING AMPLITUDE: 1.75 V
MDC IDC SET LEADCHNL RV SENSING SENSITIVITY: 2.8 mV
MDC IDC STAT BRADY AP VP PERCENT: 0.05 %
MDC IDC STAT BRADY RA PERCENT PACED: 95.52 %
MDC IDC STAT BRADY RV PERCENT PACED: 0.05 %

## 2016-07-27 NOTE — Progress Notes (Signed)
Patient ID: [name] Small, female   DOB: [birth year], 83 y.o.   MRN: [id number]     CARDIOLOGY OFFICE NOTE  Date:  08/08/2016    [name] Date of Birth: 1929/04/07 Medical Record #[id number]  PCP:  Nyoka Cowden, MD  Cardiologist:  Johnsie Cancel    No chief complaint on file.   History of Present Illness: [name] Small is a 83 y.o. female who presents today for a follow up visit.  She has a history of PAF, HTN, HLD, and DM. She had a hip fracture and was on coumadin back in February 2016 following left intratrochanteric fracture. She had missed steps in her garage.   Admitted in July 2016  with AF. She was given IV Lopressor with improvement in her heart rate. Her troponins were elevated. Cardiology was consulted. However after the Lopressor, patient did have bradycardia with heart rate in 40s, cardiology, Dr. Debara Pickett recommended to discontinue metoprolol and placed on Coreg. Her 2-D echocardiogram showed EF of 55-60%, normal wall motion. - CHADS-VASC 4-5, patient was started on Eliquis 2.5mg  BID. Aspirin was discontinued. Cardiology recommended 48 hours monitor to rule out sick sinus syndrome.   Had some sores in her mouth along with bleeding on eliquis  - was switched to Xarelto 15 mg     Dizziness July 2017 with bradycardia pacer implanted By Dr Curt Bears   BP elevated gives her a headache   Past Medical History:  Diagnosis Date  . Atrial fibrillation ([place])   . Bleeding stomach ulcer 1980s?  [name] Cancer ([place]) Meloanoma left leg  . DIVERTICULOSIS, COLON 11/22/2006  . Headache(784.0)    "very often; not regular" (11/25/2015)  . History of blood transfusion 1980s   "when I had the bleeding ulcer"  . HYPERLIPIDEMIA 11/22/2006  . Hypertension   . Menopausal syndrome (hot flashes)   . OSTEOPOROSIS 11/22/2006  . Presence of permanent cardiac pacemaker     Past Surgical History:  Procedure Laterality Date  . APPENDECTOMY    . BUNIONECTOMY Right   . CATARACT EXTRACTION W/ INTRAOCULAR LENS   IMPLANT, BILATERAL Bilateral 2000s  . CHOLECYSTECTOMY OPEN    . EP IMPLANTABLE DEVICE N/A 11/25/2015   Procedure: Pacemaker Implant;  Surgeon: Will Meredith Leeds, MD;  Location: [facility];  Service: Cardiovascular;  Laterality: N/A;  . FEMUR IM NAIL Left 07/08/2014   Procedure: INTRAMEDULLARY (IM) NAIL ;  Surgeon: Meredith Pel, MD;  [facility]: [facility];  Service: Orthopedics;  Laterality: Left;  . FRACTURE SURGERY    . REVISION TOTAL HIP ARTHROPLASTY Left 06/2014     Medications: Current Outpatient Prescriptions  Medication Sig Dispense Refill  . acetaminophen (TYLENOL) 500 MG tablet Take 500 mg by mouth every 6 (six) hours as needed for mild pain or headache.     [name] amiodarone (PACERONE) 200 MG tablet TAKE 1 TABLET(200 MG) BY MOUTH DAILY 30 tablet 2  . atorvastatin (LIPITOR) 40 MG tablet Take 40 mg by mouth daily.    . Calcium Carbonate-Vitamin D (CALTRATE 600+D PO) Take 1 tablet by mouth daily.      . Polyvinyl Alcohol-Povidone (REFRESH OP) Place 1 drop into both eyes 2 (two) times daily.    Alveda Reasons 15 MG TABS tablet TAKE 1 TABLET(15 MG) BY MOUTH DAILY WITH DINNER 30 tablet 4   No current facility-administered medications for this visit.     Allergies: Allergies  Allergen Reactions  . Lisinopril Other (See Comments)    Cough    Social History:  The patient  reports that she has never smoked. She has never used smokeless tobacco. She reports that she does not drink alcohol or use drugs.   Family History: The patient's family history includes Alzheimer's disease in her sister; Cardiomyopathy in her brother.   Review of Systems: Please see the history of present illness.   Otherwise, the review of systems is positive for none.   All other systems are reviewed and negative.   Physical Exam: VS:  BP (!) 144/80   Pulse 77   Ht 5\' 5"  (1.651 m)   Wt 129 lb 6 oz (58.7 kg)   SpO2 98%   BMI 21.53 kg/m  .  BMI Body mass index is 21.53 kg/m.  Wt Readings from Last 3  Encounters:  08/08/16 129 lb 6 oz (58.7 kg)  05/02/16 126 lb (57.2 kg)  03/06/16 126 lb (57.2 kg)    General: Pleasant. Well developed, well nourished and in no acute distress.   HEENT: Normal.  Neck: Supple, no JVD, carotid bruits, or masses noted.  Cardiac: Regular rate and rhythm. No murmurs, rubs, or gallops. No edema. Pacer under left clavicle  Respiratory:  Lungs are clear to auscultation bilaterally with normal work of breathing.  GI: Soft and nontender.  MS: No deformity or atrophy. Gait and ROM intact.  Skin: Warm and dry. Color is normal.  Neuro:  Strength and sensation are intact and no gross focal deficits noted.  Psych: Alert, appropriate and with normal affect.   LABORATORY DATA:  EKG:   03/23/15   sinus brady with a rate of 54. Has RSR'.   Lab Results  Component Value Date   WBC 5.9 12/27/2015   HGB 14.3 12/27/2015   HCT 42.0 12/27/2015   PLT 216 12/27/2015   GLUCOSE 91 12/27/2015   CHOL 277 (H) 04/07/2014   TRIG 114.0 04/07/2014   HDL 62.00 04/07/2014   LDLDIRECT 176.0 02/24/2008   LDLCALC 192 (H) 04/07/2014   ALT 12 (L) 03/09/2015   AST 15 03/09/2015   NA 141 12/27/2015   K 4.6 12/27/2015   CL 102 12/27/2015   CREATININE 1.10 (H) 12/27/2015   BUN 14 12/27/2015   CO2 28 12/27/2015   TSH 1.019 12/07/2014   INR 2.4 08/10/2014   HGBA1C 6.1 (H) 12/07/2014    BNP (last 3 results)  Recent Labs  11/24/15 0845  BNP 519.9*    ProBNP (last 3 results) No results for input(s): PROBNP in the last 8760 hours.   Other Studies Reviewed Today: Notes Recorded by Josue Hector, MD on 12/15/2014 at 2:34 PM Monitor ok no significant arrhythmias just PAC;s and PVC;s     Echo Study Conclusions from July 2017  - Left ventricle: The cavity size was normal. Wall thickness was normal. Systolic function was normal. The estimated ejection fraction was in the range of 55% to 60%. Wall motion was normal; there were no regional wall motion abnormalities.  Doppler parameters are consistent with high ventricular filling pressure. - Aortic valve: There was trivial regurgitation. - Left atrium: The atrium was mildly dilated.  Impressions:  - Normal LV function; elevated LV filling pressure; mild LAE; trace AI, MR and TR.   Assessment/Plan: 1. PAF -  Remains in sinus.  Continue amiodarone  Multaq stopped due to expense Samples given  beta blocker d/c due to bradycardia  2. Chronic anticoagulation with Xarelto - she did not tolerate Eliquis. Low dose given age and fall 15 mg  3. HTN - restart  Cozaar 50 mg   4. HLD - on statin  5. Bradycardia -  Medtronic Advisa MRI compatible pacer implanted 11/25/15 [place]   Will get labs including TSH/LFTls and BMET during upcoming pacer check in a few weeks   Jenkins Rouge, MD

## 2016-08-01 ENCOUNTER — Encounter: Payer: Self-pay | Admitting: Cardiovascular Disease

## 2016-08-02 ENCOUNTER — Other Ambulatory Visit: Payer: Self-pay | Admitting: Physician Assistant

## 2016-08-08 ENCOUNTER — Encounter (INDEPENDENT_AMBULATORY_CARE_PROVIDER_SITE_OTHER): Payer: Self-pay

## 2016-08-08 ENCOUNTER — Ambulatory Visit (INDEPENDENT_AMBULATORY_CARE_PROVIDER_SITE_OTHER): Payer: Medicare Other | Admitting: Cardiovascular Disease

## 2016-08-08 VITALS — BP 144/80 | HR 77 | Ht 65.0 in | Wt 129.4 lb

## 2016-08-08 DIAGNOSIS — I10 Essential (primary) hypertension: Secondary | ICD-10-CM | POA: Diagnosis not present

## 2016-08-08 DIAGNOSIS — I480 Paroxysmal atrial fibrillation: Secondary | ICD-10-CM | POA: Diagnosis not present

## 2016-08-08 DIAGNOSIS — Z79899 Other long term (current) drug therapy: Secondary | ICD-10-CM

## 2016-08-08 MED ORDER — LOSARTAN POTASSIUM 50 MG PO TABS: 50.0000 mg | ORAL_TABLET | Freq: Every day | ORAL | 3 refills | Status: DC

## 2016-08-08 NOTE — Patient Instructions (Addendum)
Medication Instructions:  Your physician has recommended you make the following change in your medication:  1-START Cozaar 50 mg by mouth daily  Labwork: Your physician recommends that you return for lab work in: April at Dr. Curt Bears appointment. Need a CMET and TSH   Testing/Procedures: NONE  Follow-Up: Your physician wants you to follow-up in: 3 months with Dr. Johnsie Cancel.  Your physician recommends that you schedule a follow-up appointment in: April with Dr. Curt Bears, per recall letter.   If you need a refill on your cardiac medications before your next appointment, please call your pharmacy.

## 2016-08-23 ENCOUNTER — Emergency Department (HOSPITAL_COMMUNITY)
Admission: EM | Admit: 2016-08-23 | Discharge: 2016-08-23 | Disposition: A | Discharge status: Home / Self Care | Payer: Medicare Other | Attending: Physician Assistant | Admitting: Physician Assistant

## 2016-08-23 ENCOUNTER — Encounter (HOSPITAL_COMMUNITY): Payer: Self-pay

## 2016-08-23 DIAGNOSIS — I10 Essential (primary) hypertension: Secondary | ICD-10-CM | POA: Insufficient documentation

## 2016-08-23 DIAGNOSIS — Z96641 Presence of right artificial hip joint: Secondary | ICD-10-CM | POA: Diagnosis not present

## 2016-08-23 DIAGNOSIS — Z950 Presence of cardiac pacemaker: Secondary | ICD-10-CM | POA: Insufficient documentation

## 2016-08-23 DIAGNOSIS — G43019 Migraine without aura, intractable, without status migrainosus: Secondary | ICD-10-CM | POA: Insufficient documentation

## 2016-08-23 DIAGNOSIS — G43001 Migraine without aura, not intractable, with status migrainosus: Secondary | ICD-10-CM | POA: Diagnosis not present

## 2016-08-23 DIAGNOSIS — R51 Headache: Secondary | ICD-10-CM | POA: Diagnosis present

## 2016-08-23 MED ORDER — PROCHLORPERAZINE EDISYLATE 5 MG/ML IJ SOLN
10.0000 mg | Freq: Once | INTRAMUSCULAR | Status: AC
  Administered 2016-08-23: 10 mg via INTRAVENOUS
  Filled 2016-08-23: qty 2

## 2016-08-23 MED ORDER — KETOROLAC TROMETHAMINE 15 MG/ML IJ SOLN
15.0000 mg | Freq: Once | INTRAMUSCULAR | Status: AC
  Administered 2016-08-23: 15 mg via INTRAVENOUS
  Filled 2016-08-23: qty 1

## 2016-08-23 MED ORDER — SODIUM CHLORIDE 0.9 % IV BOLUS (SEPSIS)
1000.0000 mL | Freq: Once | INTRAVENOUS | Status: AC
  Administered 2016-08-23: 1000 mL via INTRAVENOUS

## 2016-08-23 NOTE — Discharge Instructions (Signed)
We think that you likely have migraines. We were treating for migraine today and he got better. Please follow-up with your neurologist.

## 2016-08-23 NOTE — ED Provider Notes (Signed)
[facility] DEPT Provider Note   CSN: [id number] Arrival date & time: 08/23/16  1012     History   Chief Complaint Chief Complaint  Patient presents with  . Headache    HPI LARAH KUNTZMAN is a 83 y.o. female.  HPI patient is a 83 year old female with history of paroxysmal A. fib on eloquis with history of os is below headache presenting here with the same. Patient reports that she's had extensive workup for this including MRI and CAT scans. She's been seen by neurologist for this. She reports that she has another one of the headaches today. She reports its 10/10.  She took 2 "blood pressure pills" this morning to try to help. As well as Tylenol. The patient still has the pain.  Past Medical History:  Diagnosis Date  . Atrial fibrillation ([place])   . Bleeding stomach ulcer 1980s?  [name] Cancer ([place]) Meloanoma left leg  . DIVERTICULOSIS, COLON 11/22/2006  . Headache(784.0)    "very often; not regular" (11/25/2015)  . History of blood transfusion 1980s   "when I had the bleeding ulcer"  . HYPERLIPIDEMIA 11/22/2006  . Hypertension   . Menopausal syndrome (hot flashes)   . OSTEOPOROSIS 11/22/2006  . Presence of permanent cardiac pacemaker     Patient Active Problem List   Diagnosis Date Noted  . AF (atrial fibrillation) ([place]) 11/25/2015  . Atrial fibrillation ([place])   . Troponin level elevated 12/08/2014  . Hyperglycemia 12/07/2014  . Atrial fibrillation with RVR-CHADs VASc=4 12/07/2014  . Intertrochanteric fracture of left hip ([place]) 07/08/2014  . Fall   . Overactive bladder 04/02/2012  . Essential hypertension 04/02/2012  . Chest pain, atypical 09/17/2011  . Dehydration 09/16/2011  . Palpitations 09/16/2011  . Dyslipidemia 11/22/2006  . DIVERTICULOSIS, COLON 11/22/2006  . Osteoporosis 11/22/2006    Past Surgical History:  Procedure Laterality Date  . APPENDECTOMY    . BUNIONECTOMY Right   . CATARACT EXTRACTION W/ INTRAOCULAR LENS  IMPLANT, BILATERAL Bilateral 2000s  .  CHOLECYSTECTOMY OPEN    . EP IMPLANTABLE DEVICE N/A 11/25/2015   Procedure: Pacemaker Implant;  Surgeon: Will Meredith Leeds, MD;  Location: [facility];  Service: Cardiovascular;  Laterality: N/A;  . FEMUR IM NAIL Left 07/08/2014   Procedure: INTRAMEDULLARY (IM) NAIL ;  Surgeon: Meredith Pel, MD;  Location: [facility];  Service: Orthopedics;  Laterality: Left;  . FRACTURE SURGERY    . REVISION TOTAL HIP ARTHROPLASTY Left 06/2014    OB History    No data available       Home Medications    Prior to Admission medications   Medication Sig Start Date End Date Taking? Authorizing Provider  acetaminophen (TYLENOL) 500 MG tablet Take 500 mg by mouth every 6 (six) hours as needed for mild pain or headache.     Historical Provider, MD  amiodarone (PACERONE) 200 MG tablet TAKE 1 TABLET(200 MG) BY MOUTH DAILY 08/03/16   Will Meredith Leeds, MD  atorvastatin (LIPITOR) 40 MG tablet Take 40 mg by mouth daily.    Historical Provider, MD  Calcium Carbonate-Vitamin D (CALTRATE 600+D PO) Take 1 tablet by mouth daily.      Historical Provider, MD  losartan (COZAAR) 50 MG tablet Take 1 tablet (50 mg total) by mouth daily. 08/08/16   Josue Hector, MD  Polyvinyl Alcohol-Povidone (REFRESH OP) Place 1 drop into both eyes 2 (two) times daily.    Historical Provider, MD  XARELTO 15 MG TABS tablet TAKE 1 TABLET(15 MG) BY  MOUTH DAILY WITH DINNER 01/11/16   Josue Hector, MD    Family History Family History  Problem Relation Age of Onset  . Alzheimer's disease Sister   . Cardiomyopathy Brother     Social History Social History  Substance Use Topics  . Smoking status: Never Smoker  . Smokeless tobacco: Never Used  . Alcohol use No     Allergies   Lisinopril   Review of Systems Review of Systems  Constitutional: Negative for activity change.  Respiratory: Negative for shortness of breath.   Cardiovascular: Negative for chest pain.  Gastrointestinal: Negative for abdominal pain.    Neurological: Positive for headaches.     Physical Exam Updated Vital Signs BP (!) 149/62 (BP Location: Right Arm)   Pulse 63   Temp 98 F (36.7 C) (Oral)   Resp 17   Ht 5\' 5"  (1.651 m)   Wt 127 lb (57.6 kg)   SpO2 100%   BMI 21.13 kg/m   Physical Exam  Constitutional: She is oriented to person, place, and time. She appears well-developed and well-nourished.  HENT:  Head: Normocephalic and atraumatic.  Eyes: Right eye exhibits no discharge. Left eye exhibits no discharge.  Cardiovascular: Normal rate, regular rhythm and normal heart sounds.   No murmur heard. Pulmonary/Chest: Effort normal and breath sounds normal. She has no wheezes. She has no rales.  Abdominal: Soft. She exhibits no distension. There is no tenderness.  Neurological: She is oriented to person, place, and time. No cranial nerve deficit. Coordination normal.  Equal strength bilaterally upper and lower extremities negative pronator drift. Normal sensation bilaterally. Speech comprehensible, no slurring. Facial nerve tested and appears grossly normal. Alert and oriented 3.   Skin: Skin is warm and dry. She is not diaphoretic.  Psychiatric: She has a normal mood and affect.  Nursing note and vitals reviewed.    ED Treatments / Results  Labs (all labs ordered are listed, but only abnormal results are displayed) Labs Reviewed - No data to display  EKG  EKG Interpretation None       Radiology No results found.  Procedures Procedures (including critical care time)  Medications Ordered in ED Medications  sodium chloride 0.9 % bolus 1,000 mL (not administered)  prochlorperazine (COMPAZINE) injection 10 mg (not administered)  ketorolac (TORADOL) 15 MG/ML injection 15 mg (not administered)     Initial Impression / Assessment and Plan / ED Course  I have reviewed the triage vital signs and the nursing notes.  Pertinent labs & imaging results that were available during my care of the patient were  reviewed by me and considered in my medical decision making (see chart for details).     Patient is an 83 year old female presenting with recurrence occipital headache. Patient's had this in the past and had extensive workup. She is here with the same. We'll treat with migraine cocktail.  3:13 PM Patient's much improved after the migraine cocktail. We'll have her follow-up with her neurologist.  Final Clinical Impressions(s) / ED Diagnoses   Final diagnoses:  None    New Prescriptions New Prescriptions   No medications on file     Adalin Vanderploeg Julio Alm, MD 08/23/16 1514

## 2016-08-23 NOTE — ED Triage Notes (Signed)
Patient complains of occipital headache since 0530. States that she took tylenol and doubled her BP meds because she found her BP 180 on awakening. Denies trauma. Alert and oriented, no nausea, no photophobia

## 2016-09-04 ENCOUNTER — Ambulatory Visit (INDEPENDENT_AMBULATORY_CARE_PROVIDER_SITE_OTHER): Payer: Medicare Other | Admitting: Cardiology

## 2016-09-04 ENCOUNTER — Encounter: Payer: Self-pay | Admitting: Cardiology

## 2016-09-04 ENCOUNTER — Other Ambulatory Visit: Payer: Medicare Other | Admitting: *Deleted

## 2016-09-04 VITALS — BP 130/76 | HR 61 | Ht 65.0 in | Wt 131.4 lb

## 2016-09-04 DIAGNOSIS — Z45018 Encounter for adjustment and management of other part of cardiac pacemaker: Secondary | ICD-10-CM | POA: Diagnosis not present

## 2016-09-04 DIAGNOSIS — I481 Persistent atrial fibrillation: Secondary | ICD-10-CM

## 2016-09-04 DIAGNOSIS — I10 Essential (primary) hypertension: Secondary | ICD-10-CM | POA: Diagnosis not present

## 2016-09-04 DIAGNOSIS — I495 Sick sinus syndrome: Secondary | ICD-10-CM | POA: Diagnosis not present

## 2016-09-04 DIAGNOSIS — I480 Paroxysmal atrial fibrillation: Secondary | ICD-10-CM

## 2016-09-04 DIAGNOSIS — Z79899 Other long term (current) drug therapy: Secondary | ICD-10-CM

## 2016-09-04 DIAGNOSIS — I4819 Other persistent atrial fibrillation: Secondary | ICD-10-CM

## 2016-09-04 LAB — COMPREHENSIVE METABOLIC PANEL
A/G RATIO: 1.8 (ref 1.2–2.2)
ALT: 9 IU/L (ref 0–32)
AST: 12 IU/L (ref 0–40)
Albumin: 4.2 g/dL (ref 3.5–4.7)
Alkaline Phosphatase: 80 IU/L (ref 39–117)
BILIRUBIN TOTAL: 0.4 mg/dL (ref 0.0–1.2)
BUN/Creatinine Ratio: 12 (ref 12–28)
BUN: 14 mg/dL (ref 8–27)
CALCIUM: 9.1 mg/dL (ref 8.7–10.3)
CHLORIDE: 104 mmol/L (ref 96–106)
CO2: 20 mmol/L (ref 18–29)
Creatinine, Ser: 1.2 mg/dL — ABNORMAL HIGH (ref 0.57–1.00)
GFR, EST AFRICAN AMERICAN: 48 mL/min/{1.73_m2} — AB (ref >59)
GFR, EST NON AFRICAN AMERICAN: 41 mL/min/{1.73_m2} — AB (ref >59)
GLUCOSE: 129 mg/dL — AB (ref 65–99)
Globulin, Total: 2.3 g/dL (ref 1.5–4.5)
POTASSIUM: 4.6 mmol/L (ref 3.5–5.2)
Sodium: 141 mmol/L (ref 134–144)
TOTAL PROTEIN: 6.5 g/dL (ref 6.0–8.5)

## 2016-09-04 LAB — TSH: TSH: 1.67 u[IU]/mL (ref 0.450–4.500)

## 2016-09-04 NOTE — Progress Notes (Signed)
Electrophysiology Office Note   Date:  09/04/2016   ID:  [name] Small, DOB [birth year], MRN [id number]  PCP:  Nyoka Cowden, MD  Primary Electrophysiologist: Aileen Amore Meredith Leeds, MD    Chief Complaint  Patient presents with  . Pacemaker Check    Tachy-Brady syndrome     History of Present Illness: [name] Small is a 83 y.o. female who presents today for electrophysiology evaluation.   She is a 83 y.o. female with h/o PAF that was first diagnosed 7/18 with PAF. Has has a past history of a fall in 2/16 with subsequent hip fracture and had been on coumadin for a short period of time. She does have some issues with bradycardia when is SR. She is bradycardic at night when sleeping.  She was d/c on low dose carvedilol. She then had a ER visit 9/7 for another episode of afib with rvr at 160 bpm which left her feeling weak and sweaty. Pacemaker placed 11/25/15.   She is continuing to have issues with headaches. She went to the emergency room last week and received Toradol and Compazine which improved her headache symptoms. She otherwise is feeling well without major complaint.  Today, she denies symptoms of palpitations, chest pain, shortness of breath, orthopnea, PND, lower extremity edema, claudication, dizziness, presyncope, syncope, bleeding, or neurologic sequela. The patient is tolerating medications without difficulties and is otherwise without complaint today.    Past Medical History:  Diagnosis Date  . Atrial fibrillation ([place])   . Bleeding stomach ulcer 1980s?  [name] Cancer ([place]) Meloanoma left leg  . DIVERTICULOSIS, COLON 11/22/2006  . Headache(784.0)    "very often; not regular" (11/25/2015)  . History of blood transfusion 1980s   "when I had the bleeding ulcer"  . HYPERLIPIDEMIA 11/22/2006  . Hypertension   . Menopausal syndrome (hot flashes)   . OSTEOPOROSIS 11/22/2006  . Presence of permanent cardiac pacemaker    Past Surgical History:  Procedure Laterality Date  .  APPENDECTOMY    . BUNIONECTOMY Right   . CATARACT EXTRACTION W/ INTRAOCULAR LENS  IMPLANT, BILATERAL Bilateral 2000s  . CHOLECYSTECTOMY OPEN    . EP IMPLANTABLE DEVICE N/A 11/25/2015   Procedure: Pacemaker Implant;  Surgeon: Ellyanna Holton Meredith Leeds, MD;  Location: [facility];  Service: Cardiovascular;  Laterality: N/A;  . FEMUR IM NAIL Left 07/08/2014   Procedure: INTRAMEDULLARY (IM) NAIL ;  Surgeon: Meredith Pel, MD;  Location: [facility];  Service: Orthopedics;  Laterality: Left;  . FRACTURE SURGERY    . REVISION TOTAL HIP ARTHROPLASTY Left 06/2014     Current Outpatient Prescriptions  Medication Sig Dispense Refill  . acetaminophen (TYLENOL) 500 MG tablet Take 500 mg by mouth every 6 (six) hours as needed for mild pain or headache.     [name] amiodarone (PACERONE) 200 MG tablet TAKE 1 TABLET(200 MG) BY MOUTH DAILY 30 tablet 2  . atorvastatin (LIPITOR) 40 MG tablet Take 40 mg by mouth daily.    . Calcium Carbonate-Vitamin D (CALTRATE 600+D PO) Take 1 tablet by mouth daily.      [name] losartan (COZAAR) 50 MG tablet Take 1 tablet (50 mg total) by mouth daily. 90 tablet 3  . Polyvinyl Alcohol-Povidone (REFRESH OP) Place 1 drop into both eyes 2 (two) times daily.    Alveda Reasons 15 MG TABS tablet TAKE 1 TABLET(15 MG) BY MOUTH DAILY WITH DINNER 30 tablet 4   No current facility-administered medications for this visit.     Allergies:  Lisinopril   Social History:  The patient  reports that she has never smoked. She has never used smokeless tobacco. She reports that she does not drink alcohol or use drugs.   Family History:  The patient's family history includes Alzheimer's disease in her sister; Cardiomyopathy in her brother.    ROS:  Please see the history of present illness.  All other systems are reviewed and positive for headaches.    PHYSICAL EXAM: VS:  BP 130/76   Pulse 61   Ht 5\' 5"  (1.651 m)   Wt 131 lb 6.4 oz (59.6 kg)   BMI 21.87 kg/m  , BMI Body mass index is 21.87  kg/m. GEN: Well nourished, well developed, in no acute distress  HEENT: normal  Neck: no JVD, carotid bruits, or masses Cardiac: RRR; no murmurs, rubs, or gallops,no edema  Respiratory:  clear to auscultation bilaterally, normal work of breathing GI: soft, nontender, nondistended, + BS MS: no deformity or atrophy  Skin: warm and dry Neuro:  Strength and sensation are intact Psych: euthymic mood, full affect  EKG:  EKG is ordered today. Personal review of the ekg ordered today shows A paced, iRBBB  Recent Labs: 11/24/2015: B Natriuretic Peptide 519.9 12/27/2015: BUN 14; Creatinine, Ser 1.10; Hemoglobin 14.3; Platelets 216; Potassium 4.6; Sodium 141    Lipid Panel     Component Value Date/Time   CHOL 277 (H) 04/07/2014 0935   TRIG 114.0 04/07/2014 0935   HDL 62.00 04/07/2014 0935   CHOLHDL 4 04/07/2014 0935   VLDL 22.8 04/07/2014 0935   LDLCALC 192 (H) 04/07/2014 0935   LDLDIRECT 176.0 02/24/2008 1424     Wt Readings from Last 3 Encounters:  09/04/16 131 lb 6.4 oz (59.6 kg)  08/23/16 127 lb (57.6 kg)  08/08/16 129 lb 6 oz (58.7 kg)      Other studies Reviewed: Additional studies/ records that were reviewed today include: Holter 03/02/15  Review of the above records today demonstrates: Sinus rhythm sinus bradycardia PVC;s with slow bigeminny. If PVC;s not perfused effective HR as low as 24 bpm PAC;s and short bursts of atrial tachycardia less than 10 beats Average HR 54 bpm   TTE - Normal LV function; elevated LV filling pressure; mild LAE; traceAI, MR and TR.  ASSESSMENT AND PLAN:  1. PAF On Xarelto and amiodarone without current symptoms. No atrial fibrillation seen on her pacemaker interrogation. Continue current management.  2. SSS: Pacemaker placed 11/25/15. Device is functioning appropriately. Continue current management.  3. Hypertension: Well-controlled in clinic today   4. Hyperlipidemia:Followed by PCP, on a statin  5. Headaches: Improved with  Toradol and Compazine emergency room. Also improves with Tylenol. Follow-up per primary physician.  Current medicines are reviewed at length with the patient today.   The patient does not have concerns regarding her medicines.  The following changes were made today:  none  Labs/ tests ordered today include: none  Orders Placed This Encounter  Procedures  . EKG 12-Lead     Disposition:   FU with Merikay Lesniewski 6 months  Signed, Soren Lazarz Meredith Leeds, MD  09/04/2016 11:40 AM     [facility] [address] [place] [address] [phone] (office) [phone] (fax)

## 2016-09-04 NOTE — Patient Instructions (Addendum)
Medication Instructions:    Your physician recommends that you continue on your current medications as directed. Please refer to the Current Medication list given to you today.  --- If you need a refill on your cardiac medications before your next appointment, please call your pharmacy. ---  Labwork:  None ordered  Testing/Procedures:  None ordered  Follow-Up: Remote monitoring is used to monitor your Pacemaker of ICD from home. This monitoring reduces the number of office visits required to check your device to one time per year. It allows [place] to keep an eye on the functioning of your device to ensure it is working properly. You are scheduled for a device check from home on 12/04/2016. You may send your transmission at any time that day. If you have a wireless device, the transmission will be sent automatically. After your physician reviews your transmission, you will receive a postcard with your next transmission date.   Your physician wants you to follow-up in: 6 months with Dr. Curt Bears.  You will receive a reminder letter in the mail two months in advance. If you don't receive a letter, please call our office to schedule the follow-up appointment.   Thank you for choosing [facility]!!   Trinidad Curet, RN [phone]

## 2016-09-08 LAB — CUP PACEART INCLINIC DEVICE CHECK
Battery Remaining Longevity: 112 mo
Brady Statistic AP VP Percent: 0.05 %
Brady Statistic AP VS Percent: 95.81 %
Brady Statistic AS VS Percent: 4.13 %
Implantable Lead Implant Date: 20170706
Implantable Lead Location: 753859
Implantable Lead Location: 753860
Implantable Lead Model: 5076
Lead Channel Impedance Value: 418 Ohm
Lead Channel Impedance Value: 437 Ohm
Lead Channel Impedance Value: 570 Ohm
Lead Channel Pacing Threshold Amplitude: 1 V
Lead Channel Sensing Intrinsic Amplitude: 21.375 mV
Lead Channel Setting Pacing Amplitude: 1.75 V
Lead Channel Setting Pacing Amplitude: 2.5 V
Lead Channel Setting Pacing Pulse Width: 0.4 ms
MDC IDC LEAD IMPLANT DT: 20170706
MDC IDC MSMT BATTERY VOLTAGE: 3.02 V
MDC IDC MSMT LEADCHNL RA PACING THRESHOLD AMPLITUDE: 1 V
MDC IDC MSMT LEADCHNL RA PACING THRESHOLD PULSEWIDTH: 0.4 ms
MDC IDC MSMT LEADCHNL RA SENSING INTR AMPL: 1.75 mV
MDC IDC MSMT LEADCHNL RV IMPEDANCE VALUE: 361 Ohm
MDC IDC MSMT LEADCHNL RV PACING THRESHOLD PULSEWIDTH: 0.4 ms
MDC IDC PG IMPLANT DT: 20170706
MDC IDC SESS DTM: 20180416153841
MDC IDC SET LEADCHNL RV SENSING SENSITIVITY: 2.8 mV
MDC IDC STAT BRADY AS VP PERCENT: 0 %
MDC IDC STAT BRADY RA PERCENT PACED: 95.84 %
MDC IDC STAT BRADY RV PERCENT PACED: 0.05 %

## 2016-10-03 DIAGNOSIS — H35033 Hypertensive retinopathy, bilateral: Secondary | ICD-10-CM | POA: Diagnosis not present

## 2016-10-03 DIAGNOSIS — H35341 Macular cyst, hole, or pseudohole, right eye: Secondary | ICD-10-CM | POA: Diagnosis not present

## 2016-10-03 DIAGNOSIS — H01021 Squamous blepharitis right upper eyelid: Secondary | ICD-10-CM | POA: Diagnosis not present

## 2016-10-03 DIAGNOSIS — H01024 Squamous blepharitis left upper eyelid: Secondary | ICD-10-CM | POA: Diagnosis not present

## 2016-10-18 ENCOUNTER — Ambulatory Visit (INDEPENDENT_AMBULATORY_CARE_PROVIDER_SITE_OTHER)
Admission: RE | Admit: 2016-10-18 | Discharge: 2016-10-18 | Disposition: A | Discharge status: Home / Self Care | Payer: Medicare Other | Source: Ambulatory Visit | Attending: Adult Health | Admitting: Adult Health

## 2016-10-18 ENCOUNTER — Ambulatory Visit (INDEPENDENT_AMBULATORY_CARE_PROVIDER_SITE_OTHER): Payer: Medicare Other | Admitting: Adult Health

## 2016-10-18 ENCOUNTER — Encounter: Payer: Self-pay | Admitting: Adult Health

## 2016-10-18 VITALS — BP 124/50 | Ht 65.0 in | Wt 126.9 lb

## 2016-10-18 DIAGNOSIS — M533 Sacrococcygeal disorders, not elsewhere classified: Secondary | ICD-10-CM

## 2016-10-18 MED ORDER — TRAMADOL HCL 50 MG PO TABS: 50.0000 mg | ORAL_TABLET | Freq: Two times a day (BID) | ORAL | 0 refills | Status: DC | PRN

## 2016-10-18 NOTE — Progress Notes (Signed)
Subjective:    Patient ID: [name] Small, female    DOB: 1929-04-18, 83 y.o.   MRN: [id number]  HPI  83 year old female who  has a past medical history of Atrial fibrillation ([place]); Bleeding stomach ulcer (1980s?); Cancer ([place]) (Meloanoma left leg); DIVERTICULOSIS, COLON (11/22/2006); Headache(784.0); History of blood transfusion (1980s); HYPERLIPIDEMIA (11/22/2006); Hypertension; Menopausal syndrome (hot flashes); OSTEOPOROSIS (11/22/2006); and Presence of permanent cardiac pacemaker.   She is a patient of Dr. Raliegh Ip, who I am seeing today for the first time. She reports that 4 days ago she was in the shower and slipped getting , she landed on her coccyx. She did not seek care because she thought that she could withstand the pain. She does report bruising noted on her buttocks and lower back. She denies hitting her head.   She takes Nutritional therapist.   Has been using extra strength tylenol for pain control which has not been resolving her pain. Pain is worse with sitting and walking    Review of Systems See HPI   Past Medical History:  Diagnosis Date  . Atrial fibrillation ([place])   . Bleeding stomach ulcer 1980s?  [name] Cancer ([place]) Meloanoma left leg  . DIVERTICULOSIS, COLON 11/22/2006  . Headache(784.0)    "very often; not regular" (11/25/2015)  . History of blood transfusion 1980s   "when I had the bleeding ulcer"  . HYPERLIPIDEMIA 11/22/2006  . Hypertension   . Menopausal syndrome (hot flashes)   . OSTEOPOROSIS 11/22/2006  . Presence of permanent cardiac pacemaker     Social History   Social History  . Marital status: Widowed    Spouse name: N/A  . Number of children: 2  . Years of education: 12   Occupational History  .      retired   Social History Main Topics  . Smoking status: Never Smoker  . Smokeless tobacco: Never Used  . Alcohol use No  . Drug use: No  . Sexual activity: No   Other Topics Concern  . Not on file   Social History Narrative   Lives alone, son lives beside her   Caffeine - none    Past Surgical History:  Procedure Laterality Date  . APPENDECTOMY    . BUNIONECTOMY Right   . CATARACT EXTRACTION W/ INTRAOCULAR LENS  IMPLANT, BILATERAL Bilateral 2000s  . CHOLECYSTECTOMY OPEN    . EP IMPLANTABLE DEVICE N/A 11/25/2015   Procedure: Pacemaker Implant;  Surgeon: Will Meredith Leeds, MD;  Location: [facility];  Service: Cardiovascular;  Laterality: N/A;  . FEMUR IM NAIL Left 07/08/2014   Procedure: INTRAMEDULLARY (IM) NAIL ;  Surgeon: Meredith Pel, MD;  [facility];  Service: Orthopedics;  Laterality: Left;  . FRACTURE SURGERY    . REVISION TOTAL HIP ARTHROPLASTY Left 06/2014    Family History  Problem Relation Age of Onset  . Alzheimer's disease Sister   . Cardiomyopathy Brother     Allergies  Allergen Reactions  . Lisinopril Other (See Comments)    Cough    Current Outpatient Prescriptions on File Prior to Visit  Medication Sig Dispense Refill  . acetaminophen (TYLENOL) 500 MG tablet Take 500 mg by mouth every 6 (six) hours as needed for mild pain or headache.     [name] amiodarone (PACERONE) 200 MG tablet TAKE 1 TABLET(200 MG) BY MOUTH DAILY 30 tablet 2  . atorvastatin (LIPITOR) 40 MG tablet Take 40 mg by mouth daily.    . Calcium Carbonate-Vitamin D (CALTRATE  600+D PO) Take 1 tablet by mouth daily.      [name] losartan (COZAAR) 50 MG tablet Take 1 tablet (50 mg total) by mouth daily. 90 tablet 3  . Polyvinyl Alcohol-Povidone (REFRESH OP) Place 1 drop into both eyes 2 (two) times daily.    Alveda Reasons 15 MG TABS tablet TAKE 1 TABLET(15 MG) BY MOUTH DAILY WITH DINNER 30 tablet 4  . [DISCONTINUED] dronedarone (MULTAQ) 400 MG tablet Take 1 tablet (400 mg total) by mouth daily.     No current facility-administered medications on file prior to visit.     BP (!) 124/50 (BP Location: Left Arm, Patient Position: Sitting, Cuff Size: Small)   Ht 5\' 5"  (1.651 m)   Wt 126 lb 14.4 oz (57.6 kg)   BMI 21.12 kg/m       Objective:   Physical  Exam  Constitutional: She is oriented to person, place, and time. She appears well-developed and well-nourished. No distress.  Cardiovascular: Normal rate, regular rhythm, normal heart sounds and intact distal pulses.  Exam reveals no gallop and no friction rub.   No murmur heard. Pulmonary/Chest: Effort normal and breath sounds normal. No respiratory distress. She has no wheezes. She has no rales. She exhibits no tenderness.  Musculoskeletal: She exhibits tenderness (with palpation to coccyx).  Neurological: She is alert and oriented to person, place, and time.  Skin: Skin is warm and dry. She is not diaphoretic.  Well healing bruises noted on low mid back and left buttocks   Psychiatric: She has a normal mood and affect. Her behavior is normal. Judgment and thought content normal.  Nursing note and vitals reviewed.      Assessment & Plan:  1. Pain in the coccyx - Will get x ray to look for fracture. If there is a fracture we can consider a course of vitamin D. I am ok with trialing Tramadol for pain. We spoke about the dangers and side effects of this medication.  - traMADol (ULTRAM) 50 MG tablet; Take 1 tablet (50 mg total) by mouth every 12 (twelve) hours as needed.  Dispense: 15 tablet; Refill: 0 - DG Sacrum/Coccyx; Future - Get donut for sitting  - Follow up with PCP if needed  Dorothyann Peng, NP

## 2016-10-31 ENCOUNTER — Encounter: Payer: Self-pay | Admitting: Internal Medicine

## 2016-10-31 ENCOUNTER — Ambulatory Visit (INDEPENDENT_AMBULATORY_CARE_PROVIDER_SITE_OTHER): Payer: Medicare Other | Admitting: Internal Medicine

## 2016-10-31 VITALS — BP 132/66 | HR 68 | Temp 98.1°F | Ht 65.0 in | Wt 126.8 lb

## 2016-10-31 DIAGNOSIS — E785 Hyperlipidemia, unspecified: Secondary | ICD-10-CM | POA: Diagnosis not present

## 2016-10-31 DIAGNOSIS — I480 Paroxysmal atrial fibrillation: Secondary | ICD-10-CM

## 2016-10-31 DIAGNOSIS — I10 Essential (primary) hypertension: Secondary | ICD-10-CM

## 2016-10-31 NOTE — Progress Notes (Signed)
Subjective:    Patient ID: [name] Small, female    DOB: 13-Apr-1929, 83 y.o.   MRN: [id number]  HPI  83 year old patient who has a history of PAF with tachybradycardia syndrome, status post pacemaker and followed by cardiology.  Remains on amiodarone as well as anticoagulation Doing quite well Recent fall with a contusion to the coccyx.  Remains symptomatic, but improving Otherwise, doing quite well.  She has essential hypertension and mild dyslipidemia  Past Medical History:  Diagnosis Date  . Atrial fibrillation ([place])   . Bleeding stomach ulcer 1980s?  [name] Cancer ([place]) Meloanoma left leg  . DIVERTICULOSIS, COLON 11/22/2006  . Headache(784.0)    "very often; not regular" (11/25/2015)  . History of blood transfusion 1980s   "when I had the bleeding ulcer"  . HYPERLIPIDEMIA 11/22/2006  . Hypertension   . Menopausal syndrome (hot flashes)   . OSTEOPOROSIS 11/22/2006  . Presence of permanent cardiac pacemaker      Social History   Social History  . Marital status: Widowed    Spouse name: N/A  . Number of children: 2  . Years of education: 12   Occupational History  .      retired   Social History Main Topics  . Smoking status: Never Smoker  . Smokeless tobacco: Never Used  . Alcohol use No  . Drug use: No  . Sexual activity: No   Other Topics Concern  . Not on file   Social History Narrative   Lives alone, son lives beside her   Caffeine - none    Past Surgical History:  Procedure Laterality Date  . APPENDECTOMY    . BUNIONECTOMY Right   . CATARACT EXTRACTION W/ INTRAOCULAR LENS  IMPLANT, BILATERAL Bilateral 2000s  . CHOLECYSTECTOMY OPEN    . EP IMPLANTABLE DEVICE N/A 11/25/2015   Procedure: Pacemaker Implant;  Surgeon: Will Meredith Leeds, MD;  Location: [facility];  Service: Cardiovascular;  Laterality: N/A;  . FEMUR IM NAIL Left 07/08/2014   Procedure: INTRAMEDULLARY (IM) NAIL ;  Surgeon: Meredith Pel, MD;  [facility];  Service: Orthopedics;   Laterality: Left;  . FRACTURE SURGERY    . REVISION TOTAL HIP ARTHROPLASTY Left 06/2014    Family History  Problem Relation Age of Onset  . Alzheimer's disease Sister   . Cardiomyopathy Brother     Allergies  Allergen Reactions  . Lisinopril Other (See Comments)    Cough    Current Outpatient Prescriptions on File Prior to Visit  Medication Sig Dispense Refill  . acetaminophen (TYLENOL) 500 MG tablet Take 500 mg by mouth every 6 (six) hours as needed for mild pain or headache.     [name] amiodarone (PACERONE) 200 MG tablet TAKE 1 TABLET(200 MG) BY MOUTH DAILY 30 tablet 2  . atorvastatin (LIPITOR) 40 MG tablet Take 40 mg by mouth daily.    . Calcium Carbonate-Vitamin D (CALTRATE 600+D PO) Take 1 tablet by mouth daily.      [name] losartan (COZAAR) 50 MG tablet Take 1 tablet (50 mg total) by mouth daily. 90 tablet 3  . Polyvinyl Alcohol-Povidone (REFRESH OP) Place 1 drop into both eyes 2 (two) times daily.    . traMADol (ULTRAM) 50 MG tablet Take 1 tablet (50 mg total) by mouth every 12 (twelve) hours as needed. 15 tablet 0  . XARELTO 15 MG TABS tablet TAKE 1 TABLET(15 MG) BY MOUTH DAILY WITH DINNER 30 tablet 4  . [DISCONTINUED] dronedarone (MULTAQ) 400 MG  tablet Take 1 tablet (400 mg total) by mouth daily.     No current facility-administered medications on file prior to visit.     BP 132/66 (BP Location: Left Arm, Patient Position: Sitting, Cuff Size: Normal)   Pulse 68   Temp 98.1 F (36.7 C) (Oral)   Ht 5\' 5"  (1.651 m)   Wt 126 lb 12.8 oz (57.5 kg)   SpO2 99%   BMI 21.10 kg/m     Review of Systems  Constitutional: Negative.   HENT: Negative for congestion, dental problem, hearing loss, rhinorrhea, sinus pressure, sore throat and tinnitus.   Eyes: Negative for pain, discharge and visual disturbance.  Respiratory: Negative for cough and shortness of breath.   Cardiovascular: Negative for chest pain, palpitations and leg swelling.  Gastrointestinal: Negative for abdominal  distention, abdominal pain, blood in stool, constipation, diarrhea, nausea and vomiting.  Genitourinary: Negative for difficulty urinating, dysuria, flank pain, frequency, hematuria, pelvic pain, urgency, vaginal bleeding, vaginal discharge and vaginal pain.  Musculoskeletal: Positive for back pain and gait problem. Negative for arthralgias and joint swelling.  Skin: Negative for rash.  Neurological: Negative for dizziness, syncope, speech difficulty, weakness, numbness and headaches.  Hematological: Negative for adenopathy.  Psychiatric/Behavioral: Negative for agitation, behavioral problems and dysphoric mood. The patient is not nervous/anxious.        Objective:   Physical Exam  Constitutional: She is oriented to person, place, and time. She appears well-developed and well-nourished.  Alert No distress Walks with a cane Blood pressure 130/64  HENT:  Head: Normocephalic.  Right Ear: External ear normal.  Left Ear: External ear normal.  Mouth/Throat: Oropharynx is clear and moist.  Eyes: Conjunctivae and EOM are normal. Pupils are equal, round, and reactive to light.  Neck: Normal range of motion. Neck supple. No thyromegaly present.  Cardiovascular: Normal rate, regular rhythm, normal heart sounds and intact distal pulses.   Rhythm is regular  Pulmonary/Chest: Effort normal and breath sounds normal.  Abdominal: Soft. Bowel sounds are normal. She exhibits no mass. There is no tenderness.  Musculoskeletal: Normal range of motion.  Lymphadenopathy:    She has no cervical adenopathy.  Neurological: She is alert and oriented to person, place, and time.  Skin: Skin is warm and dry. No rash noted.  Psychiatric: She has a normal mood and affect. Her behavior is normal.          Assessment & Plan:   Paroxysmal atrial fibrillation, stable Essential hypertension, well-controlled Dyslipidemia.  Continue statin therapy  No change in medical regimen Cardiology follow-up Continue  anticoagulation Follow-up 6 months or as needed  Nyoka Cowden

## 2016-10-31 NOTE — Patient Instructions (Addendum)
WE NOW OFFER   [name] Small's FAST TRACK!!!  SAME DAY Appointments for ACUTE CARE  Such as: Sprains, Injuries, cuts, abrasions, rashes, muscle pain, joint pain, back pain Colds, flu, sore throats, headache, allergies, cough, fever  Ear pain, sinus and eye infections Abdominal pain, nausea, vomiting, diarrhea, upset stomach Animal/insect bites  3 Easy Ways to Schedule: Walk-In Scheduling Call in scheduling Mychart Sign-up: [email or web address]   Limit your sodium (Salt) intake  Please check your blood pressure on a regular basis.  If it is consistently greater than 150/90, please make an office appointment.  Return in 6 months for follow-up

## 2016-11-01 ENCOUNTER — Other Ambulatory Visit: Payer: Self-pay | Admitting: Internal Medicine

## 2016-11-14 ENCOUNTER — Ambulatory Visit: Payer: Medicare Other | Admitting: Cardiovascular Disease

## 2016-11-15 ENCOUNTER — Other Ambulatory Visit: Payer: Self-pay | Admitting: Cardiovascular Disease

## 2016-11-21 DIAGNOSIS — Z1231 Encounter for screening mammogram for malignant neoplasm of breast: Secondary | ICD-10-CM | POA: Diagnosis not present

## 2016-11-21 LAB — HM MAMMOGRAPHY

## 2016-11-27 ENCOUNTER — Encounter: Payer: Self-pay | Admitting: Family Medicine

## 2016-12-04 ENCOUNTER — Encounter: Payer: Medicare Other | Admitting: *Deleted

## 2016-12-04 ENCOUNTER — Telehealth: Payer: Self-pay | Admitting: Cardiology

## 2016-12-04 NOTE — Telephone Encounter (Signed)
LMOVM reminding pt to send remote transmission.   

## 2016-12-07 ENCOUNTER — Encounter: Payer: Self-pay | Admitting: Cardiology

## 2016-12-15 ENCOUNTER — Encounter: Payer: Self-pay | Admitting: Internal Medicine

## 2016-12-15 ENCOUNTER — Ambulatory Visit (INDEPENDENT_AMBULATORY_CARE_PROVIDER_SITE_OTHER): Payer: Medicare Other | Admitting: Internal Medicine

## 2016-12-15 VITALS — BP 122/62 | HR 66 | Temp 98.1°F | Ht 65.0 in | Wt 126.4 lb

## 2016-12-15 DIAGNOSIS — I10 Essential (primary) hypertension: Secondary | ICD-10-CM

## 2016-12-15 NOTE — Patient Instructions (Signed)
Call or return to clinic prn if these symptoms worsen or fail to improve as anticipated.  Proper foot care as discussed  Please call for podiatry referral.  If unimproved

## 2016-12-15 NOTE — Progress Notes (Signed)
Subjective:    Patient ID: [name] Small, female    DOB: Jul 14, 1928, 83 y.o.   MRN: [id number]  HPI 83 year old patient who has multiple medical issues including essential hypertension. She presents with a chief complaint of a painful left foot for the past week. She has pain over the left first metatarsal head.  When she walks on hard surfaces but is pain-free when she walks on carpeting. No history of trauma  Past Medical History:  Diagnosis Date  . Atrial fibrillation ([place])   . Bleeding stomach ulcer 1980s?  [name] Cancer ([place]) Meloanoma left leg  . DIVERTICULOSIS, COLON 11/22/2006  . Headache(784.0)    "very often; not regular" (11/25/2015)  . History of blood transfusion 1980s   "when I had the bleeding ulcer"  . HYPERLIPIDEMIA 11/22/2006  . Hypertension   . Menopausal syndrome (hot flashes)   . OSTEOPOROSIS 11/22/2006  . Presence of permanent cardiac pacemaker      Social History   Social History  . Marital status: Widowed    Spouse name: N/A  . Number of children: 2  . Years of education: 12   Occupational History  .      retired   Social History Main Topics  . Smoking status: Never Smoker  . Smokeless tobacco: Never Used  . Alcohol use No  . Drug use: No  . Sexual activity: No   Other Topics Concern  . Not on file   Social History Narrative   Lives alone, son lives beside her   Caffeine - none    Past Surgical History:  Procedure Laterality Date  . APPENDECTOMY    . BUNIONECTOMY Right   . CATARACT EXTRACTION W/ INTRAOCULAR LENS  IMPLANT, BILATERAL Bilateral 2000s  . CHOLECYSTECTOMY OPEN    . EP IMPLANTABLE DEVICE N/A 11/25/2015   Procedure: Pacemaker Implant;  Surgeon: Will Meredith Leeds, MD;  Location: [facility];  Service: Cardiovascular;  Laterality: N/A;  . FEMUR IM NAIL Left 07/08/2014   Procedure: INTRAMEDULLARY (IM) NAIL ;  Surgeon: Meredith Pel, MD;  Location: [facility];  Service: Orthopedics;  Laterality: Left;  . FRACTURE SURGERY    .  REVISION TOTAL HIP ARTHROPLASTY Left 06/2014    Family History  Problem Relation Age of Onset  . Alzheimer's disease Sister   . Cardiomyopathy Brother     Allergies  Allergen Reactions  . Lisinopril Other (See Comments)    Cough    Current Outpatient Prescriptions on File Prior to Visit  Medication Sig Dispense Refill  . acetaminophen (TYLENOL) 500 MG tablet Take 500 mg by mouth every 6 (six) hours as needed for mild pain or headache.     [name] amiodarone (PACERONE) 200 MG tablet TAKE 1 TABLET(200 MG) BY MOUTH DAILY 30 tablet 2  . atorvastatin (LIPITOR) 40 MG tablet Take 40 mg by mouth daily.    [name] atorvastatin (LIPITOR) 40 MG tablet TAKE 1 TABLET BY MOUTH DAILY 90 tablet 0  . Calcium Carbonate-Vitamin D (CALTRATE 600+D PO) Take 1 tablet by mouth daily.      [name] losartan (COZAAR) 50 MG tablet Take 1 tablet (50 mg total) by mouth daily. 90 tablet 3  . Polyvinyl Alcohol-Povidone (REFRESH OP) Place 1 drop into both eyes 2 (two) times daily.    . traMADol (ULTRAM) 50 MG tablet Take 1 tablet (50 mg total) by mouth every 12 (twelve) hours as needed. 15 tablet 0  . XARELTO 15 MG TABS tablet TAKE 1 TABLET(15 MG) BY  MOUTH DAILY WITH DINNER 30 tablet 5  . [DISCONTINUED] dronedarone (MULTAQ) 400 MG tablet Take 1 tablet (400 mg total) by mouth daily.     No current facility-administered medications on file prior to visit.     BP 122/62 (BP Location: Left Arm, Patient Position: Sitting, Cuff Size: Normal)   Pulse 66   Temp 98.1 F (36.7 C) (Oral)   Ht 5\' 5"  (1.651 m)   Wt 126 lb 6.4 oz (57.3 kg)   SpO2 98%   BMI 21.03 kg/m      Review of Systems  Constitutional: Negative.   HENT: Negative for congestion, dental problem, hearing loss, rhinorrhea, sinus pressure, sore throat and tinnitus.   Eyes: Negative for pain, discharge and visual disturbance.  Respiratory: Negative for cough and shortness of breath.   Cardiovascular: Negative for chest pain, palpitations and leg swelling.    Gastrointestinal: Negative for abdominal distention, abdominal pain, blood in stool, constipation, diarrhea, nausea and vomiting.  Genitourinary: Negative for difficulty urinating, dysuria, flank pain, frequency, hematuria, pelvic pain, urgency, vaginal bleeding, vaginal discharge and vaginal pain.  Musculoskeletal: Positive for gait problem. Negative for arthralgias and joint swelling.  Skin: Negative for rash.  Neurological: Negative for dizziness, syncope, speech difficulty, weakness, numbness and headaches.  Hematological: Negative for adenopathy.  Psychiatric/Behavioral: Negative for agitation, behavioral problems and dysphoric mood. The patient is not nervous/anxious.        Objective:   Physical Exam  Constitutional: She appears well-developed and well-nourished.  Blood pressure well controlled  Musculoskeletal:  The left foot was examined No inflammatory changes about the left first MCP joint Mild tenderness over the first metatarsal head.  There was a small callus also noted over the plantar surface of the foot over the metatarsal head          Assessment & Plan:   Left foot pain.  Proper foot care discussed at length  We'll attempt to minimize trauma.  If unimproved, will set up for podiatry visit Essential hypertension, well-controlled  Romain Erion Pilar Plate

## 2016-12-18 ENCOUNTER — Ambulatory Visit (INDEPENDENT_AMBULATORY_CARE_PROVIDER_SITE_OTHER): Payer: Medicare Other | Admitting: *Deleted

## 2016-12-18 DIAGNOSIS — I495 Sick sinus syndrome: Secondary | ICD-10-CM

## 2016-12-19 NOTE — Progress Notes (Signed)
Remote pacemaker transmission.   

## 2016-12-19 NOTE — Progress Notes (Signed)
Patient ID: [name] Small, female   DOB: Jul 19, 1928, 83 y.o.   MRN: [id number]     CARDIOLOGY OFFICE NOTE  Date:  12/22/2016    [name] Small Date of Birth: 1928/06/19 Medical Record #[id number]  PCP:  [name] Lor, MD  Cardiologist:  [name] Small    No chief complaint on file.   History of Present Illness: [name] Small is a 83 y.o. female who presents today for a follow up visit.  She has a history of PAF, HTN, HLD, and DM. She had a hip fracture and was on coumadin back in February 2016 following left intratrochanteric fracture. She had missed steps in her garage.   Admitted in July 2016  with AF. She was given IV Lopressor with improvement in her heart rate. Her troponins were elevated. Cardiology was consulted. However after the Lopressor, patient did have bradycardia with heart rate in 40s, cardiology, Dr. Debara Small recommended to discontinue metoprolol and placed on Coreg. Her 2-D echocardiogram showed EF of 55-60%, normal wall motion. - CHADS-VASC 4-5, patient was started on Eliquis 2.5mg  BID. Aspirin was discontinued. Cardiology recommended 48 hours monitor to rule out sick sinus syndrome.   Had some sores in her mouth along with bleeding on eliquis  - was switched to Xarelto 15 mg     Dizziness July 2017 with bradycardia pacer implanted By Dr [name] Small   BP elevated gives her a headache   She was kind enough to bring me fried apple pies today  Past Medical History:  Diagnosis Date  . Atrial fibrillation ([place])   . Bleeding stomach ulcer 1980s?  [name] Cancer ([place]) Meloanoma left leg  . DIVERTICULOSIS, COLON 11/22/2006  . Headache(784.0)    "very often; not regular" (11/25/2015)  . History of blood transfusion 1980s   "when I had the bleeding ulcer"  . HYPERLIPIDEMIA 11/22/2006  . Hypertension   . Menopausal syndrome (hot flashes)   . OSTEOPOROSIS 11/22/2006  . Presence of permanent cardiac pacemaker     Past Surgical History:  Procedure Laterality Date  . APPENDECTOMY    .  BUNIONECTOMY Right   . CATARACT EXTRACTION W/ INTRAOCULAR LENS  IMPLANT, BILATERAL Bilateral 2000s  . CHOLECYSTECTOMY OPEN    . EP IMPLANTABLE DEVICE N/A 11/25/2015   Procedure: Pacemaker Implant;  Surgeon: [name] [name] Leeds, MD;  Location: [facility];  Service: Cardiovascular;  Laterality: N/A;  . FEMUR IM NAIL Left 07/08/2014   Procedure: INTRAMEDULLARY (IM) NAIL ;  Surgeon: [name] Pel, MD;  Location: [facility];  Service: Orthopedics;  Laterality: Left;  . FRACTURE SURGERY    . REVISION TOTAL HIP ARTHROPLASTY Left 06/2014     Medications: Current Outpatient Prescriptions  Medication Sig Dispense Refill  . acetaminophen (TYLENOL) 500 MG tablet Take 500 mg by mouth every 6 (six) hours as needed for mild pain or headache.     [name] amiodarone (PACERONE) 200 MG tablet TAKE 1 TABLET(200 MG) BY MOUTH DAILY 30 tablet 2  . atorvastatin (LIPITOR) 40 MG tablet TAKE 1 TABLET BY MOUTH DAILY 90 tablet 0  . Calcium Carbonate-Vitamin D (CALTRATE 600+D PO) Take 1 tablet by mouth daily.      [name] losartan (COZAAR) 50 MG tablet Take 1 tablet (50 mg total) by mouth daily. 90 tablet 3  . Polyvinyl Alcohol-Povidone (REFRESH OP) Place 1 drop into both eyes 2 (two) times daily.    . traMADol (ULTRAM) 50 MG tablet Take 1 tablet (50 mg total) by mouth every 12 (twelve) hours  as needed. 15 tablet 0  . XARELTO 15 MG TABS tablet TAKE 1 TABLET(15 MG) BY MOUTH DAILY WITH DINNER 30 tablet 5   No current facility-administered medications for this visit.     Allergies: Allergies  Allergen Reactions  . Lisinopril Other (See Comments)    Cough    Social History: The patient  reports that she has never smoked. She has never used smokeless tobacco. She reports that she does not drink alcohol or use drugs.   Family History: The patient's family history includes Alzheimer's disease in her sister; Cardiomyopathy in her brother.   Review of Systems: Please see the history of present illness.   Otherwise, the  review of systems is positive for none.   All other systems are reviewed and negative.   Physical Exam: VS:  BP 132/78   Pulse 68   Ht 5\' 5"  (1.651 m)   Wt 128 lb 6.4 oz (58.2 kg)   SpO2 99%   BMI 21.37 kg/m  .  BMI Body mass index is 21.37 kg/m.  Wt Readings from Last 3 Encounters:  12/22/16 128 lb 6.4 oz (58.2 kg)  12/15/16 126 lb 6.4 oz (57.3 kg)  10/31/16 126 lb 12.8 oz (57.5 kg)    Affect appropriate Healthy:  appears stated age HEENT: normal Neck supple with no adenopathy JVP normal no bruits no thyromegaly Lungs clear with no wheezing and good diaphragmatic motion Heart:  S1/S2 no murmur, no rub, gallop or click PMI normal pacer under left clavicle  Abdomen: benighn, BS positve, no tenderness, no AAA no bruit.  No HSM or HJR Distal pulses intact with no bruits No edema Neuro non-focal Skin warm and dry No muscular weakness    LABORATORY DATA:  EKG:   03/23/15   sinus brady with a rate of 54. Has RSR'.   Lab Results  Component Value Date   WBC 5.9 12/27/2015   HGB 14.3 12/27/2015   HCT 42.0 12/27/2015   PLT 216 12/27/2015   GLUCOSE 129 (H) 09/04/2016   CHOL 277 (H) 04/07/2014   TRIG 114.0 04/07/2014   HDL 62.00 04/07/2014   LDLDIRECT 176.0 02/24/2008   LDLCALC 192 (H) 04/07/2014   ALT 9 09/04/2016   AST 12 09/04/2016   NA 141 09/04/2016   K 4.6 09/04/2016   CL 104 09/04/2016   CREATININE 1.20 (H) 09/04/2016   BUN 14 09/04/2016   CO2 20 09/04/2016   TSH 1.670 09/04/2016   INR 2.4 08/10/2014   HGBA1C 6.1 (H) 12/07/2014    BNP (last 3 results) No results for input(s): BNP in the last 8760 hours.  ProBNP (last 3 results) No results for input(s): PROBNP in the last 8760 hours.   Other Studies Reviewed Today: Notes Recorded by [name] Hector, MD on 12/15/2014 at 2:34 PM Monitor ok no significant arrhythmias just PAC;s and PVC;s     Echo Study Conclusions from July 2017  - Left ventricle: The cavity size was normal. Wall thickness  was normal. Systolic function was normal. The estimated ejection fraction was in the range of 55% to 60%. Wall motion was normal; there were no regional wall motion abnormalities. Doppler parameters are consistent with high ventricular filling pressure. - Aortic valve: There was trivial regurgitation. - Left atrium: The atrium was mildly dilated.  Impressions:  - Normal LV function; elevated LV filling pressure; mild LAE; trace AI, MR and TR.   Assessment/Plan: 1. PAF -  Remains in sinus.  Continue amiodarone  Multaq stopped  due to expense Samples given  beta blocker d/c due to bradycardia  LFTls and TSH normal April 2018  2. Chronic anticoagulation with Xarelto - she did not tolerate Eliquis. Low dose given age and fall 15 mg  3. HTN - improved on ARB low sodium diet    4. HLD - on statin  5. Bradycardia -  Medtronic Advisa MRI compatible pacer implanted 11/25/15 [place] Interrogation shows no PAF    Jenkins Rouge, MD

## 2016-12-21 LAB — CUP PACEART REMOTE DEVICE CHECK
Battery Remaining Longevity: 108 mo
Battery Voltage: 3.02 V
Brady Statistic AP VP Percent: 0.06 %
Brady Statistic AS VP Percent: 0.01 %
Brady Statistic RA Percent Paced: 97.33 %
Brady Statistic RV Percent Paced: 0.06 %
Implantable Lead Implant Date: 20170706
Implantable Lead Location: 753859
Implantable Lead Location: 753860
Implantable Lead Model: 5076
Implantable Pulse Generator Implant Date: 20170706
Lead Channel Impedance Value: 361 Ohm
Lead Channel Impedance Value: 456 Ohm
Lead Channel Impedance Value: 551 Ohm
Lead Channel Pacing Threshold Amplitude: 0.75 V
Lead Channel Pacing Threshold Pulse Width: 0.4 ms
Lead Channel Sensing Intrinsic Amplitude: 20.25 mV
Lead Channel Setting Pacing Amplitude: 1.75 V
Lead Channel Setting Pacing Pulse Width: 0.4 ms
Lead Channel Setting Sensing Sensitivity: 2.8 mV
MDC IDC LEAD IMPLANT DT: 20170706
MDC IDC MSMT LEADCHNL RA IMPEDANCE VALUE: 418 Ohm
MDC IDC MSMT LEADCHNL RA SENSING INTR AMPL: 3.5 mV
MDC IDC MSMT LEADCHNL RA SENSING INTR AMPL: 3.5 mV
MDC IDC MSMT LEADCHNL RV PACING THRESHOLD AMPLITUDE: 1 V
MDC IDC MSMT LEADCHNL RV PACING THRESHOLD PULSEWIDTH: 0.4 ms
MDC IDC MSMT LEADCHNL RV SENSING INTR AMPL: 20.25 mV
MDC IDC SESS DTM: 20180728185650
MDC IDC SET LEADCHNL RV PACING AMPLITUDE: 2.5 V
MDC IDC STAT BRADY AP VS PERCENT: 97.29 %
MDC IDC STAT BRADY AS VS PERCENT: 2.65 %

## 2016-12-22 ENCOUNTER — Encounter: Payer: Self-pay | Admitting: Cardiovascular Disease

## 2016-12-22 ENCOUNTER — Encounter (INDEPENDENT_AMBULATORY_CARE_PROVIDER_SITE_OTHER): Payer: Self-pay

## 2016-12-22 ENCOUNTER — Encounter: Payer: Self-pay | Admitting: Cardiology

## 2016-12-22 ENCOUNTER — Ambulatory Visit (INDEPENDENT_AMBULATORY_CARE_PROVIDER_SITE_OTHER): Payer: Medicare Other | Admitting: Cardiovascular Disease

## 2016-12-22 VITALS — BP 132/78 | HR 68 | Ht 65.0 in | Wt 128.4 lb

## 2016-12-22 DIAGNOSIS — I495 Sick sinus syndrome: Secondary | ICD-10-CM

## 2016-12-22 NOTE — Patient Instructions (Signed)

## 2017-01-05 ENCOUNTER — Other Ambulatory Visit: Payer: Self-pay | Admitting: Internal Medicine

## 2017-01-05 ENCOUNTER — Telehealth: Payer: Self-pay | Admitting: Internal Medicine

## 2017-01-05 MED ORDER — SULFAMETHOXAZOLE-TRIMETHOPRIM 800-160 MG PO TABS: 1.0000 | ORAL_TABLET | Freq: Two times a day (BID) | ORAL | 0 refills | Status: DC

## 2017-01-05 NOTE — Telephone Encounter (Signed)
Please advise 

## 2017-01-05 NOTE — Telephone Encounter (Signed)
Notify patient that an antibiotic was called in

## 2017-01-05 NOTE — Telephone Encounter (Signed)
Pt is calling wanting to see if Dr. Raliegh Ip would call something in for her cystitist unable to come in due to her having her grand babies.  Pharm:  [facility] on [address]

## 2017-01-08 NOTE — Telephone Encounter (Signed)
Pt was made aware.  

## 2017-02-06 ENCOUNTER — Telehealth: Payer: Self-pay | Admitting: Internal Medicine

## 2017-02-06 ENCOUNTER — Ambulatory Visit (INDEPENDENT_AMBULATORY_CARE_PROVIDER_SITE_OTHER): Payer: Medicare Other | Admitting: *Deleted

## 2017-02-06 DIAGNOSIS — Z23 Encounter for immunization: Secondary | ICD-10-CM | POA: Diagnosis not present

## 2017-02-06 NOTE — Telephone Encounter (Signed)
Spoke with pt and informed her she should not be taking abt. She c/o of continuing urinary issues so an OV was made

## 2017-02-06 NOTE — Telephone Encounter (Signed)
Patient walked in to request that she no longer take the Sulfameth/TMP 800, stating that it makes her hallucinate. She was wondering what else she could take in its place. Patient uses the [facility] on [address]. Please advise.

## 2017-02-06 NOTE — Telephone Encounter (Signed)
Please advise 

## 2017-02-06 NOTE — Telephone Encounter (Signed)
Asked patient to discontinue this antibiotic.  This antibiotic was prescribed 1 month ago for 7 days only.  No further antibiotics needed

## 2017-02-09 ENCOUNTER — Ambulatory Visit (INDEPENDENT_AMBULATORY_CARE_PROVIDER_SITE_OTHER): Payer: Medicare Other | Admitting: Internal Medicine

## 2017-02-09 ENCOUNTER — Encounter: Payer: Self-pay | Admitting: Internal Medicine

## 2017-02-09 VITALS — BP 130/64 | HR 76 | Temp 97.8°F | Ht 65.0 in | Wt 123.0 lb

## 2017-02-09 DIAGNOSIS — R300 Dysuria: Secondary | ICD-10-CM

## 2017-02-09 LAB — POC URINALSYSI DIPSTICK (AUTOMATED)
BILIRUBIN UA: NEGATIVE
Blood, UA: NEGATIVE
Glucose, UA: NEGATIVE
KETONES UA: NEGATIVE
NITRITE UA: NEGATIVE
Protein, UA: NEGATIVE
Spec Grav, UA: >=1.03 — AB (ref 1.010–1.025)
Urobilinogen, UA: 0.2 E.U./dL
pH, UA: 5.5 (ref 5.0–8.0)

## 2017-02-09 MED ORDER — AMOXICILLIN 250 MG PO CAPS: 250.0000 mg | ORAL_CAPSULE | Freq: Three times a day (TID) | ORAL | 0 refills | Status: DC

## 2017-02-09 NOTE — Patient Instructions (Signed)
Take your antibiotic as prescribed until ALL of it is gone, but stop if you develop a rash, swelling, or any side effects of the medication.  Contact our office as soon as possible if  there are side effects of the medication.  Return in 6 months for follow-up 

## 2017-02-09 NOTE — Progress Notes (Signed)
Subjective:    Patient ID: [name] Small, female    DOB: April 29, 1929, 83 y.o.   MRN: [id number]  HPI  83 year old patient who is placed on Septra DS on 8/17 due to dysuria.  She called the office, 9/18 complaining of difficulties with the antibiotic.  She states that the medication made her feel poorly and hallucinate. She still has several pills left in her prescription of 14 pills prescribed last month She states that she continues to have some dysuria but has been improved over the past 2 days No fever or chills Denies any flank pain  Past Medical History:  Diagnosis Date  . Atrial fibrillation ([place])   . Bleeding stomach ulcer 1980s?  [name] Cancer ([place]) Meloanoma left leg  . DIVERTICULOSIS, COLON 11/22/2006  . Headache(784.0)    "very often; not regular" (11/25/2015)  . History of blood transfusion 1980s   "when I had the bleeding ulcer"  . HYPERLIPIDEMIA 11/22/2006  . Hypertension   . Menopausal syndrome (hot flashes)   . OSTEOPOROSIS 11/22/2006  . Presence of permanent cardiac pacemaker      Social History   Social History  . Marital status: Widowed    Spouse name: N/A  . Number of children: 2  . Years of education: 12   Occupational History  .      retired   Social History Main Topics  . Smoking status: Never Smoker  . Smokeless tobacco: Never Used  . Alcohol use No  . Drug use: No  . Sexual activity: No   Other Topics Concern  . Not on file   Social History Narrative   Lives alone, son lives beside her   Caffeine - none    Past Surgical History:  Procedure Laterality Date  . APPENDECTOMY    . BUNIONECTOMY Right   . CATARACT EXTRACTION W/ INTRAOCULAR LENS  IMPLANT, BILATERAL Bilateral 2000s  . CHOLECYSTECTOMY OPEN    . EP IMPLANTABLE DEVICE N/A 11/25/2015   Procedure: Pacemaker Implant;  Surgeon: Will Meredith Leeds, MD;  Location: [facility];  Service: Cardiovascular;  Laterality: N/A;  . FEMUR IM NAIL Left 07/08/2014   Procedure: INTRAMEDULLARY (IM)  NAIL ;  Surgeon: Meredith Pel, MD;  Location: [facility];  Service: Orthopedics;  Laterality: Left;  . FRACTURE SURGERY    . REVISION TOTAL HIP ARTHROPLASTY Left 06/2014    Family History  Problem Relation Age of Onset  . Alzheimer's disease Sister   . Cardiomyopathy Brother     Allergies  Allergen Reactions  . Lisinopril Other (See Comments)    Cough    Current Outpatient Prescriptions on File Prior to Visit  Medication Sig Dispense Refill  . acetaminophen (TYLENOL) 500 MG tablet Take 500 mg by mouth every 6 (six) hours as needed for mild pain or headache.     [name] amiodarone (PACERONE) 200 MG tablet TAKE 1 TABLET(200 MG) BY MOUTH DAILY 30 tablet 2  . atorvastatin (LIPITOR) 40 MG tablet TAKE 1 TABLET BY MOUTH DAILY 90 tablet 0  . Calcium Carbonate-Vitamin D (CALTRATE 600+D PO) Take 1 tablet by mouth daily.      [name] losartan (COZAAR) 50 MG tablet Take 1 tablet (50 mg total) by mouth daily. 90 tablet 3  . Polyvinyl Alcohol-Povidone (REFRESH OP) Place 1 drop into both eyes 2 (two) times daily.    . traMADol (ULTRAM) 50 MG tablet Take 1 tablet (50 mg total) by mouth every 12 (twelve) hours as needed. 15 tablet 0  .  XARELTO 15 MG TABS tablet TAKE 1 TABLET(15 MG) BY MOUTH DAILY WITH DINNER 30 tablet 5  . sulfamethoxazole-trimethoprim (BACTRIM DS,SEPTRA DS) 800-160 MG tablet Take 1 tablet by mouth 2 (two) times daily. (Patient not taking: Reported on 02/09/2017) 14 tablet 0  . [DISCONTINUED] dronedarone (MULTAQ) 400 MG tablet Take 1 tablet (400 mg total) by mouth daily.     No current facility-administered medications on file prior to visit.     BP 130/64 (BP Location: Left Arm, Patient Position: Sitting, Cuff Size: Normal)   Pulse 76   Temp 97.8 F (36.6 C) (Oral)   Ht 5\' 5"  (1.651 m)   Wt 123 lb (55.8 kg)   SpO2 98%   BMI 20.47 kg/m     Review of Systems  Constitutional: Negative.   HENT: Negative for congestion, dental problem, hearing loss, rhinorrhea, sinus pressure, sore  throat and tinnitus.   Eyes: Negative for pain, discharge and visual disturbance.  Respiratory: Negative for cough and shortness of breath.   Cardiovascular: Negative for chest pain, palpitations and leg swelling.  Gastrointestinal: Negative for abdominal distention, abdominal pain, blood in stool, constipation, diarrhea, nausea and vomiting.  Genitourinary: Positive for dysuria and urgency. Negative for difficulty urinating, flank pain, frequency, hematuria, pelvic pain, vaginal bleeding, vaginal discharge and vaginal pain.  Musculoskeletal: Negative for arthralgias, gait problem and joint swelling.  Skin: Negative for rash.  Neurological: Negative for dizziness, syncope, speech difficulty, weakness, numbness and headaches.  Hematological: Negative for adenopathy.  Psychiatric/Behavioral: Negative for agitation, behavioral problems and dysphoric mood. The patient is not nervous/anxious.        Objective:   Physical Exam  Constitutional: She is oriented to person, place, and time. She appears well-developed and well-nourished.  HENT:  Head: Normocephalic.  Right Ear: External ear normal.  Left Ear: External ear normal.  Mouth/Throat: Oropharynx is clear and moist.  Eyes: Pupils are equal, round, and reactive to light. Conjunctivae and EOM are normal.  Neck: Normal range of motion. Neck supple. No thyromegaly present.  Cardiovascular: Normal rate, regular rhythm, normal heart sounds and intact distal pulses.   Pulmonary/Chest: Effort normal and breath sounds normal.  Abdominal: Soft. Bowel sounds are normal. She exhibits no mass. There is no tenderness.  No flank or suprapubic tenderness  Musculoskeletal: Normal range of motion.  Lymphadenopathy:    She has no cervical adenopathy.  Neurological: She is alert and oriented to person, place, and time.  Skin: Skin is warm and dry. No rash noted.  Psychiatric: She has a normal mood and affect. Her behavior is normal.            Assessment & Plan:   Dysuria/UTI.  Will treat with amoxicillin for 5 days Essential hypertension, stable  Nyoka Cowden

## 2017-02-13 DIAGNOSIS — R69 Illness, unspecified: Secondary | ICD-10-CM | POA: Diagnosis not present

## 2017-03-03 ENCOUNTER — Other Ambulatory Visit: Payer: Self-pay | Admitting: Cardiology

## 2017-03-19 ENCOUNTER — Ambulatory Visit (INDEPENDENT_AMBULATORY_CARE_PROVIDER_SITE_OTHER): Payer: Medicare Other | Admitting: *Deleted

## 2017-03-19 DIAGNOSIS — I495 Sick sinus syndrome: Secondary | ICD-10-CM

## 2017-03-20 LAB — CUP PACEART REMOTE DEVICE CHECK
Brady Statistic AP VP Percent: 0.05 %
Brady Statistic AP VS Percent: 95.26 %
Brady Statistic AS VP Percent: 0 %
Brady Statistic AS VS Percent: 4.68 %
Brady Statistic RV Percent Paced: 0.05 %
Implantable Lead Implant Date: 20170706
Implantable Lead Location: 753859
Lead Channel Impedance Value: 361 Ohm
Lead Channel Impedance Value: 437 Ohm
Lead Channel Impedance Value: 437 Ohm
Lead Channel Impedance Value: 570 Ohm
Lead Channel Pacing Threshold Amplitude: 1 V
Lead Channel Pacing Threshold Pulse Width: 0.4 ms
Lead Channel Sensing Intrinsic Amplitude: 20.375 mV
Lead Channel Setting Pacing Amplitude: 1.75 V
Lead Channel Setting Pacing Amplitude: 2.5 V
Lead Channel Setting Sensing Sensitivity: 2.8 mV
MDC IDC LEAD IMPLANT DT: 20170706
MDC IDC LEAD LOCATION: 753860
MDC IDC MSMT BATTERY REMAINING LONGEVITY: 108 mo
MDC IDC MSMT BATTERY VOLTAGE: 3.02 V
MDC IDC MSMT LEADCHNL RA PACING THRESHOLD AMPLITUDE: 0.875 V
MDC IDC MSMT LEADCHNL RA PACING THRESHOLD PULSEWIDTH: 0.4 ms
MDC IDC MSMT LEADCHNL RA SENSING INTR AMPL: 2.375 mV
MDC IDC MSMT LEADCHNL RA SENSING INTR AMPL: 2.375 mV
MDC IDC MSMT LEADCHNL RV SENSING INTR AMPL: 20.375 mV
MDC IDC PG IMPLANT DT: 20170706
MDC IDC SESS DTM: 20181028213424
MDC IDC SET LEADCHNL RV PACING PULSEWIDTH: 0.4 ms
MDC IDC STAT BRADY RA PERCENT PACED: 95.29 %

## 2017-03-20 NOTE — Progress Notes (Signed)
Remote pacemaker transmission.   

## 2017-03-27 ENCOUNTER — Encounter: Payer: Self-pay | Admitting: Cardiology

## 2017-04-25 DIAGNOSIS — H35033 Hypertensive retinopathy, bilateral: Secondary | ICD-10-CM | POA: Diagnosis not present

## 2017-04-25 DIAGNOSIS — H35341 Macular cyst, hole, or pseudohole, right eye: Secondary | ICD-10-CM | POA: Diagnosis not present

## 2017-05-01 ENCOUNTER — Ambulatory Visit: Payer: Medicare Other | Admitting: Internal Medicine

## 2017-05-09 ENCOUNTER — Ambulatory Visit: Payer: Medicare Other | Admitting: Internal Medicine

## 2017-05-23 ENCOUNTER — Other Ambulatory Visit: Payer: Self-pay | Admitting: Internal Medicine

## 2017-06-18 ENCOUNTER — Telehealth: Payer: Self-pay | Admitting: Cardiology

## 2017-06-18 ENCOUNTER — Ambulatory Visit (INDEPENDENT_AMBULATORY_CARE_PROVIDER_SITE_OTHER): Payer: Medicare Other | Admitting: *Deleted

## 2017-06-18 DIAGNOSIS — I495 Sick sinus syndrome: Secondary | ICD-10-CM

## 2017-06-18 NOTE — Telephone Encounter (Signed)
Spoke with pt and reminded pt of remote transmission that is due today. Pt verbalized understanding.   

## 2017-06-19 NOTE — Progress Notes (Signed)
Remote pacemaker transmission.   

## 2017-06-20 LAB — CUP PACEART REMOTE DEVICE CHECK
Battery Remaining Longevity: 106 mo
Brady Statistic AP VS Percent: 95.2 %
Brady Statistic RA Percent Paced: 95.24 %
Brady Statistic RV Percent Paced: 0.06 %
Date Time Interrogation Session: 20190128232647
Implantable Lead Implant Date: 20170706
Implantable Lead Location: 753859
Implantable Lead Location: 753860
Implantable Lead Model: 5076
Lead Channel Impedance Value: 437 Ohm
Lead Channel Impedance Value: 551 Ohm
Lead Channel Pacing Threshold Amplitude: 1 V
Lead Channel Pacing Threshold Pulse Width: 0.4 ms
Lead Channel Sensing Intrinsic Amplitude: 1.875 mV
Lead Channel Sensing Intrinsic Amplitude: 19.75 mV
Lead Channel Sensing Intrinsic Amplitude: 19.75 mV
Lead Channel Setting Pacing Amplitude: 2.5 V
Lead Channel Setting Pacing Pulse Width: 0.4 ms
Lead Channel Setting Sensing Sensitivity: 2.8 mV
MDC IDC LEAD IMPLANT DT: 20170706
MDC IDC MSMT BATTERY VOLTAGE: 3.02 V
MDC IDC MSMT LEADCHNL RA IMPEDANCE VALUE: 437 Ohm
MDC IDC MSMT LEADCHNL RA PACING THRESHOLD AMPLITUDE: 0.625 V
MDC IDC MSMT LEADCHNL RA SENSING INTR AMPL: 1.875 mV
MDC IDC MSMT LEADCHNL RV IMPEDANCE VALUE: 361 Ohm
MDC IDC MSMT LEADCHNL RV PACING THRESHOLD PULSEWIDTH: 0.4 ms
MDC IDC PG IMPLANT DT: 20170706
MDC IDC SET LEADCHNL RA PACING AMPLITUDE: 1.5 V
MDC IDC STAT BRADY AP VP PERCENT: 0.06 %
MDC IDC STAT BRADY AS VP PERCENT: 0 %
MDC IDC STAT BRADY AS VS PERCENT: 4.73 %

## 2017-06-21 ENCOUNTER — Encounter: Payer: Self-pay | Admitting: Cardiology

## 2017-06-26 ENCOUNTER — Encounter: Payer: Medicare Other | Admitting: Internal Medicine

## 2017-07-29 NOTE — Progress Notes (Signed)
Patient ID: TANAYAH SQUITIERI, female   DOB: [birth year], 84 y.o.   MRN: [id number]     CARDIOLOGY OFFICE NOTE  Date:  07/31/2017    Dorothey Baseman [place] Date of Birth: 10-05-1928 Medical Record #[id number]  PCP:  Marletta Lor, MD  Cardiologist:  Johnsie Cancel    No chief complaint on file.   History of Present Illness:  84 y.o. SSS with pacer PAF, HTN, HLD and DM-2 Eliquis caused oral blisters and bleeding changed to Xarelto 15 mg daily. 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 Atrial fibrillation ([place])   . Bleeding stomach ulcer 1980s?  [name] Cancer ([place]) Meloanoma left leg  . DIVERTICULOSIS, COLON 11/22/2006  . Headache(784.0)    "very often; not regular" (11/25/2015)  . History of blood transfusion 1980s   "when I had the bleeding ulcer"  . HYPERLIPIDEMIA 11/22/2006  . Hypertension   . Menopausal syndrome (hot flashes)   . OSTEOPOROSIS 11/22/2006  . Presence of permanent cardiac pacemaker     Past Surgical History:  Procedure Laterality Date  . APPENDECTOMY    . BUNIONECTOMY Right   . CATARACT EXTRACTION W/ INTRAOCULAR LENS  IMPLANT, BILATERAL Bilateral 2000s  . CHOLECYSTECTOMY OPEN    . EP IMPLANTABLE DEVICE N/A 11/25/2015   Procedure: Pacemaker Implant;  Surgeon: Will Meredith Leeds, MD;  Location: [facility];  Service: Cardiovascular;  Laterality: N/A;  . FEMUR IM NAIL Left 07/08/2014   Procedure: INTRAMEDULLARY (IM) NAIL ;  Surgeon: Meredith Pel, MD;  Location: [facility];  Service: Orthopedics;  Laterality: Left;  . FRACTURE SURGERY    . REVISION TOTAL HIP ARTHROPLASTY Left 06/2014     Medications: Current Outpatient Medications  Medication Sig Dispense Refill  . acetaminophen (TYLENOL) 500 MG tablet Take 500 mg by mouth every 6 (six) hours as needed for mild pain or headache.     [name] amiodarone  (PACERONE) 200 MG tablet TAKE 1 TABLET(200 MG) BY MOUTH DAILY 30 tablet 10  . atorvastatin (LIPITOR) 40 MG tablet TAKE 1 TABLET BY MOUTH DAILY 90 tablet 0  . Calcium Carbonate-Vitamin D (CALTRATE 600+D PO) Take 1 tablet by mouth daily.      [name] losartan (COZAAR) 50 MG tablet Take 1 tablet (50 mg total) by mouth daily. 90 tablet 3  . Polyvinyl Alcohol-Povidone (REFRESH OP) Place 1 drop into both eyes 2 (two) times daily.    Alveda Reasons 15 MG TABS tablet TAKE 1 TABLET(15 MG) BY MOUTH DAILY WITH DINNER 30 tablet 5   No current facility-administered medications for this visit.     Allergies: Allergies  Allergen Reactions  . Septra Ds [Sulfamethoxazole-Trimethoprim]   . Lisinopril Other (See Comments)    Cough    Social History: The patient  reports that  has never smoked. she has never used smokeless tobacco. She reports that she does not drink alcohol or use drugs.   Family History: The patient's family history includes Alzheimer's disease in her sister; Cardiomyopathy in her brother.   Review of Systems: Please see the history of present illness.   Otherwise, the review of systems is positive for none.   All other systems are reviewed and negative.   Physical Exam: VS:  BP (!) 146/70   Pulse 63   Ht 5\' 5"  (1.651 m)  Wt 120 lb 12.8 oz (54.8 kg)   BMI 20.10 kg/m  .  BMI Body mass index is 20.1 kg/m.  Wt Readings from Last 3 Encounters:  07/31/17 120 lb 12.8 oz (54.8 kg)  02/09/17 123 lb (55.8 kg)  12/22/16 128 lb 6.4 oz (58.2 kg)   Affect appropriate Healthy:  appears stated age HEENT: normal Neck supple with no adenopathy JVP normal no bruits no thyromegaly Lungs clear with no wheezing and good diaphragmatic motion Heart:  S1/S2 no murmur, no rub, gallop or click PMI normal pacer under left clavicle  Abdomen: benighn, BS positve, no tenderness, no AAA no bruit.  No HSM or HJR Distal pulses intact with no bruits No edema Neuro non-focal Skin warm and dry No muscular  weakness    LABORATORY DATA:  EKG:   09/04/16 A pacing ICRBBB normal T Waves . 07/31/17 A pacing rate 63 otherwise normal   Lab Results  Component Value Date   WBC 5.9 12/27/2015   HGB 14.3 12/27/2015   HCT 42.0 12/27/2015   PLT 216 12/27/2015   GLUCOSE 129 (H) 09/04/2016   CHOL 277 (H) 04/07/2014   TRIG 114.0 04/07/2014   HDL 62.00 04/07/2014   LDLDIRECT 176.0 02/24/2008   LDLCALC 192 (H) 04/07/2014   ALT 9 09/04/2016   AST 12 09/04/2016   NA 141 09/04/2016   K 4.6 09/04/2016   CL 104 09/04/2016   CREATININE 1.20 (H) 09/04/2016   BUN 14 09/04/2016   CO2 20 09/04/2016   TSH 1.670 09/04/2016   INR 2.4 08/10/2014   HGBA1C 6.1 (H) 12/07/2014     Other Studies Reviewed Today: Notes Recorded by Josue Hector, MD on 12/15/2014 at 2:34 PM Monitor ok no significant arrhythmias just PAC;s and PVC;s     Echo Study Conclusions from July 2017  - Left ventricle: The cavity size was normal. Wall thickness was normal. Systolic function was normal. The estimated ejection fraction was in the range of 55% to 60%. Wall motion was normal; there were no regional wall motion abnormalities. Doppler parameters are consistent with high ventricular filling pressure. - Aortic valve: There was trivial regurgitation. - Left atrium: The atrium was mildly dilated.  Impressions:  - Normal LV function; elevated LV filling pressure; mild LAE; trace AI, MR and TR.   Assessment/Plan: 1. PAF NSR on amiodarone  LFTls and TSH normal April 2018  2. Chronic anticoagulation with Xarelto- Low dose given age and fall 15 mg  3. HTN - Well controlled.  Continue current medications and low sodium Dash type diet.    4. HLD - on statin labs with primary   5. Bradycardia -  Medtronic Advisa MRI compatible pacer implanted 11/25/15 [place] Interrogation shows no PAF  6. Back pain: relief with Tylenol f/u primary in am     Jenkins Rouge, MD

## 2017-07-31 ENCOUNTER — Ambulatory Visit: Payer: Medicare Other | Admitting: Cardiovascular Disease

## 2017-07-31 ENCOUNTER — Encounter: Payer: Self-pay | Admitting: Cardiovascular Disease

## 2017-07-31 VITALS — BP 146/70 | HR 63 | Ht 65.0 in | Wt 120.8 lb

## 2017-07-31 DIAGNOSIS — Z7901 Long term (current) use of anticoagulants: Secondary | ICD-10-CM | POA: Diagnosis not present

## 2017-07-31 DIAGNOSIS — I10 Essential (primary) hypertension: Secondary | ICD-10-CM | POA: Diagnosis not present

## 2017-07-31 DIAGNOSIS — R001 Bradycardia, unspecified: Secondary | ICD-10-CM

## 2017-07-31 DIAGNOSIS — I4819 Other persistent atrial fibrillation: Secondary | ICD-10-CM

## 2017-07-31 DIAGNOSIS — I481 Persistent atrial fibrillation: Secondary | ICD-10-CM | POA: Diagnosis not present

## 2017-07-31 NOTE — Patient Instructions (Addendum)

## 2017-08-01 ENCOUNTER — Ambulatory Visit (INDEPENDENT_AMBULATORY_CARE_PROVIDER_SITE_OTHER): Payer: Medicare Other | Admitting: Internal Medicine

## 2017-08-01 ENCOUNTER — Encounter: Payer: Self-pay | Admitting: Internal Medicine

## 2017-08-01 VITALS — BP 120/80 | HR 65 | Temp 97.8°F | Wt 120.0 lb

## 2017-08-01 DIAGNOSIS — R739 Hyperglycemia, unspecified: Secondary | ICD-10-CM | POA: Diagnosis not present

## 2017-08-01 DIAGNOSIS — Z0000 Encounter for general adult medical examination without abnormal findings: Secondary | ICD-10-CM | POA: Diagnosis not present

## 2017-08-01 DIAGNOSIS — I10 Essential (primary) hypertension: Secondary | ICD-10-CM | POA: Diagnosis not present

## 2017-08-01 DIAGNOSIS — E785 Hyperlipidemia, unspecified: Secondary | ICD-10-CM

## 2017-08-01 DIAGNOSIS — I4891 Unspecified atrial fibrillation: Secondary | ICD-10-CM

## 2017-08-01 LAB — CBC WITH DIFFERENTIAL/PLATELET
Basophils Absolute: 0 10*3/uL (ref 0.0–0.1)
Basophils Relative: 0.5 % (ref 0.0–3.0)
EOS ABS: 0.1 10*3/uL (ref 0.0–0.7)
Eosinophils Relative: 0.9 % (ref 0.0–5.0)
HEMATOCRIT: 41.9 % (ref 36.0–46.0)
Hemoglobin: 13.7 g/dL (ref 12.0–15.0)
LYMPHS ABS: 1.5 10*3/uL (ref 0.7–4.0)
LYMPHS PCT: 23.8 % (ref 12.0–46.0)
MCHC: 32.8 g/dL (ref 30.0–36.0)
MCV: 92.6 fl (ref 78.0–100.0)
MONOS PCT: 7.8 % (ref 3.0–12.0)
Monocytes Absolute: 0.5 10*3/uL (ref 0.1–1.0)
NEUTROS ABS: 4.2 10*3/uL (ref 1.4–7.7)
NEUTROS PCT: 67 % (ref 43.0–77.0)
PLATELETS: 221 10*3/uL (ref 150.0–400.0)
RBC: 4.52 Mil/uL (ref 3.87–5.11)
RDW: 14.7 % (ref 11.5–15.5)
WBC: 6.3 10*3/uL (ref 4.0–10.5)

## 2017-08-01 LAB — COMPREHENSIVE METABOLIC PANEL
ALBUMIN: 4.4 g/dL (ref 3.5–5.2)
ALT: 15 U/L (ref 0–35)
AST: 17 U/L (ref 0–37)
Alkaline Phosphatase: 63 U/L (ref 39–117)
BUN: 13 mg/dL (ref 6–23)
CALCIUM: 9.8 mg/dL (ref 8.4–10.5)
CHLORIDE: 103 meq/L (ref 96–112)
CO2: 28 mEq/L (ref 19–32)
Creatinine, Ser: 1.11 mg/dL (ref 0.40–1.20)
GFR: 49.47 mL/min — AB (ref >60.00)
Glucose, Bld: 90 mg/dL (ref 70–99)
POTASSIUM: 4.1 meq/L (ref 3.5–5.1)
Sodium: 140 mEq/L (ref 135–145)
Total Bilirubin: 0.8 mg/dL (ref 0.2–1.2)
Total Protein: 6.9 g/dL (ref 6.0–8.3)

## 2017-08-01 LAB — LIPID PANEL
CHOLESTEROL: 163 mg/dL (ref 0–200)
HDL: 75.5 mg/dL (ref >39.00)
LDL CALC: 74 mg/dL (ref 0–99)
NonHDL: 87.52
TRIGLYCERIDES: 67 mg/dL (ref 0.0–149.0)
Total CHOL/HDL Ratio: 2
VLDL: 13.4 mg/dL (ref 0.0–40.0)

## 2017-08-01 LAB — TSH: TSH: 2.13 u[IU]/mL (ref 0.35–4.50)

## 2017-08-01 NOTE — Progress Notes (Signed)
Subjective:    Patient ID: [name] Small, female    DOB: 16-Oct-1928, 84 y.o.   MRN: [id number]  HPI  84 year old patient who is seen today for an annual preventive health examination as well as a subsequent Medicare wellness visit. She continues to do quite well.  She was seen by cardiology yesterday and is followed following pacemaker implantation for tachybradycardia syndrome. She remains on anticoagulation. Medical issues include essential hypertension dyslipidemia.  She remains on statin therapy.  Subsequent Medicare wellness visit  Past Medical History:  Diagnosis Date  . Atrial fibrillation ([place])   . Bleeding stomach ulcer 1980s?  [name] Cancer ([place]) Meloanoma left leg  . DIVERTICULOSIS, COLON 11/22/2006  . Headache(784.0)    "very often; not regular" (11/25/2015)  . History of blood transfusion 1980s   "when I had the bleeding ulcer"  . HYPERLIPIDEMIA 11/22/2006  . Hypertension   . Menopausal syndrome (hot flashes)   . OSTEOPOROSIS 11/22/2006  . Presence of permanent cardiac pacemaker      Social History   Socioeconomic History  . Marital status: Widowed    Spouse name: Not on file  . Number of children: 2  . Years of education: 33  . Highest education level: Not on file  Social Needs  . Financial resource strain: Not on file  . Food insecurity - worry: Not on file  . Food insecurity - inability: Not on file  . Transportation needs - medical: Not on file  . Transportation needs - non-medical: Not on file  Occupational History    Comment: retired  Tobacco Use  . Smoking status: Never Smoker  . Smokeless tobacco: Never Used  Substance and Sexual Activity  . Alcohol use: No    Alcohol/week: 0.0 oz  . Drug use: No  . Sexual activity: No  Other Topics Concern  . Not on file  Social History Narrative   Lives alone, son lives beside her   Caffeine - none    Past Surgical History:  Procedure Laterality Date  . APPENDECTOMY    . BUNIONECTOMY Right   . CATARACT  EXTRACTION W/ INTRAOCULAR LENS  IMPLANT, BILATERAL Bilateral 2000s  . CHOLECYSTECTOMY OPEN    . EP IMPLANTABLE DEVICE N/A 11/25/2015   Procedure: Pacemaker Implant;  Surgeon: Will Meredith Leeds, MD;  Location: [facility];  Service: Cardiovascular;  Laterality: N/A;  . FEMUR IM NAIL Left 07/08/2014   Procedure: INTRAMEDULLARY (IM) NAIL ;  Surgeon: Meredith Pel, MD;  [facility];  Service: Orthopedics;  Laterality: Left;  . FRACTURE SURGERY    . REVISION TOTAL HIP ARTHROPLASTY Left 06/2014    Family History  Problem Relation Age of Onset  . Alzheimer's disease Sister   . Cardiomyopathy Brother     Allergies  Allergen Reactions  . Septra Ds [Sulfamethoxazole-Trimethoprim]   . Lisinopril Other (See Comments)    Cough    Current Outpatient Medications on File Prior to Visit  Medication Sig Dispense Refill  . acetaminophen (TYLENOL) 500 MG tablet Take 500 mg by mouth every 6 (six) hours as needed for mild pain or headache.     [name] amiodarone (PACERONE) 200 MG tablet TAKE 1 TABLET(200 MG) BY MOUTH DAILY 30 tablet 10  . atorvastatin (LIPITOR) 40 MG tablet TAKE 1 TABLET BY MOUTH DAILY 90 tablet 0  . Calcium Carbonate-Vitamin D (CALTRATE 600+D PO) Take 1 tablet by mouth daily.      [name] losartan (COZAAR) 50 MG tablet Take 1 tablet (50 mg  total) by mouth daily. 90 tablet 3  . Polyvinyl Alcohol-Povidone (REFRESH OP) Place 1 drop into both eyes 2 (two) times daily.    Alveda Reasons 15 MG TABS tablet TAKE 1 TABLET(15 MG) BY MOUTH DAILY WITH DINNER 30 tablet 5  . [DISCONTINUED] dronedarone (MULTAQ) 400 MG tablet Take 1 tablet (400 mg total) by mouth daily.     No current facility-administered medications on file prior to visit.     BP 120/80 (BP Location: Left Arm, Patient Position: Sitting, Cuff Size: Normal)   Pulse 65   Temp 97.8 F (36.6 C) (Oral)   Wt 120 lb (54.4 kg)   SpO2 98%   BMI 19.97 kg/m   Subsequent Medicare wellness visit  1. Risk factors, based on past  M,S,F  history.  Cardiovascular risk factors include hypertension and dyslipidemia.  Patient has a history of atrial fibrillation  2.  Physical activities:tries to walk daily.  Uses a cane  3.  Depression/mood:no history of major depression or mood disorder 4.  Hearing: no major deficits  5.  ADL's:independent  6.  Fall risk:moderate.  Walks with a cane 7.  Home safety:no problems identified  8.  Height weight, and visual acuity;height and weight stable no change in visual acuity; is followed by ophthalmology annually and has had a recent visit  9.  Counseling:continue heart healthy diet and active lifestyle  10. Lab orders: We will check updated lab including lipid profile  11. Referral :follow-up cardiology 12. Care plan:continue efforts at aggressive risk factor modification.  Continue statin therapy  13. Cognitive assessment: alert and appropriate with normal affect.  No cognitive dysfunction  14. Screening: Patient provided with a written and personalized 5-10 year screening schedule in the AVS.    15. Provider List Update: primary care cardiology, ophthalmology    Review of Systems  Constitutional: Negative.   HENT: Negative for congestion, dental problem, hearing loss, rhinorrhea, sinus pressure, sore throat and tinnitus.   Eyes: Negative for pain, discharge and visual disturbance.  Respiratory: Negative for cough and shortness of breath.   Cardiovascular: Negative for chest pain, palpitations and leg swelling.  Gastrointestinal: Negative for abdominal distention, abdominal pain, blood in stool, constipation, diarrhea, nausea and vomiting.  Genitourinary: Negative for difficulty urinating, dysuria, flank pain, frequency, hematuria, pelvic pain, urgency, vaginal bleeding, vaginal discharge and vaginal pain.  Musculoskeletal: Positive for arthralgias, back pain and gait problem. Negative for joint swelling.  Skin: Negative for rash.  Neurological: Negative for  dizziness, syncope, speech difficulty, weakness, numbness and headaches.  Hematological: Negative for adenopathy.  Psychiatric/Behavioral: Negative for agitation, behavioral problems and dysphoric mood. The patient is not nervous/anxious.        Objective:   Physical Exam  Constitutional: She is oriented to person, place, and time. She appears well-developed and well-nourished.  Blood pressure 116/80 Weight 120  HENT:  Head: Normocephalic and atraumatic.  Right Ear: External ear normal.  Left Ear: External ear normal.  Mouth/Throat: Oropharynx is clear and moist.  Eyes: Conjunctivae and EOM are normal.  Neck: Normal range of motion. Neck supple. No JVD present. No thyromegaly present.  Cardiovascular: Normal rate, regular rhythm, normal heart sounds and intact distal pulses.  No murmur heard. Dorsalis pedis pulses full.  Posterior tibial pulses faint  Pulmonary/Chest: Effort normal and breath sounds normal. She has no wheezes. She has no rales.  Pacemaker left upper anterior chest wall  Abdominal: Soft. Bowel sounds are normal. She exhibits no distension and no mass. There is no  tenderness. There is no rebound and no guarding.  Musculoskeletal: Normal range of motion. She exhibits no edema or tenderness.  Neurological: She is alert and oriented to person, place, and time. She has normal reflexes. No cranial nerve deficit. She exhibits normal muscle tone. Coordination normal.  Skin: Skin is warm and dry. No rash noted.  Psychiatric: She has a normal mood and affect. Her behavior is normal.          Assessment & Plan:   Preventive health examination Subsequent Medicare wellness visit Status post pacemaker for tachybradycardia syndrome stable.  Follow-up cardiology.  Continue chronic anticoagulation Essential hypertension well-controlled Dyslipidemia continue statin therapy will review a lipid profile Status post left hip fracture  Follow-up 6 months  Semone Orlov  Pilar Plate

## 2017-08-01 NOTE — Patient Instructions (Signed)
Limit your sodium (Salt) intake  Please check your blood pressure on a regular basis.  If it is consistently greater than 150/90, please make an office appointment.  Return in 6 months for follow-up    Cardiology follow-up as scheduled

## 2017-08-23 ENCOUNTER — Other Ambulatory Visit: Payer: Self-pay | Admitting: Internal Medicine

## 2017-08-24 ENCOUNTER — Other Ambulatory Visit: Payer: Self-pay

## 2017-08-24 MED ORDER — ATORVASTATIN CALCIUM 40 MG PO TABS: 40.0000 mg | ORAL_TABLET | Freq: Every day | ORAL | 0 refills | Status: DC

## 2017-09-17 ENCOUNTER — Ambulatory Visit (INDEPENDENT_AMBULATORY_CARE_PROVIDER_SITE_OTHER): Payer: Medicare Other | Admitting: *Deleted

## 2017-09-17 DIAGNOSIS — I495 Sick sinus syndrome: Secondary | ICD-10-CM

## 2017-09-17 NOTE — Progress Notes (Signed)
Remote pacemaker transmission.   

## 2017-09-18 ENCOUNTER — Encounter: Payer: Self-pay | Admitting: Cardiology

## 2017-09-19 LAB — CUP PACEART REMOTE DEVICE CHECK
Battery Remaining Longevity: 103 mo
Battery Voltage: 3.02 V
Brady Statistic AP VP Percent: 0.05 %
Brady Statistic RA Percent Paced: 96.57 %
Date Time Interrogation Session: 20190427131130
Implantable Lead Implant Date: 20170706
Implantable Lead Location: 753860
Implantable Pulse Generator Implant Date: 20170706
Lead Channel Impedance Value: 361 Ohm
Lead Channel Impedance Value: 456 Ohm
Lead Channel Pacing Threshold Amplitude: 0.875 V
Lead Channel Pacing Threshold Pulse Width: 0.4 ms
Lead Channel Pacing Threshold Pulse Width: 0.4 ms
Lead Channel Setting Pacing Amplitude: 1.75 V
Lead Channel Setting Sensing Sensitivity: 2.8 mV
MDC IDC LEAD IMPLANT DT: 20170706
MDC IDC LEAD LOCATION: 753859
MDC IDC MSMT LEADCHNL RA IMPEDANCE VALUE: 608 Ohm
MDC IDC MSMT LEADCHNL RA SENSING INTR AMPL: 2.5 mV
MDC IDC MSMT LEADCHNL RA SENSING INTR AMPL: 2.5 mV
MDC IDC MSMT LEADCHNL RV IMPEDANCE VALUE: 437 Ohm
MDC IDC MSMT LEADCHNL RV PACING THRESHOLD AMPLITUDE: 1 V
MDC IDC MSMT LEADCHNL RV SENSING INTR AMPL: 21.5 mV
MDC IDC MSMT LEADCHNL RV SENSING INTR AMPL: 21.5 mV
MDC IDC SET LEADCHNL RV PACING AMPLITUDE: 2.5 V
MDC IDC SET LEADCHNL RV PACING PULSEWIDTH: 0.4 ms
MDC IDC STAT BRADY AP VS PERCENT: 96.53 %
MDC IDC STAT BRADY AS VP PERCENT: 0 %
MDC IDC STAT BRADY AS VS PERCENT: 3.41 %
MDC IDC STAT BRADY RV PERCENT PACED: 0.05 %

## 2017-10-05 ENCOUNTER — Telehealth: Payer: Self-pay | Admitting: Internal Medicine

## 2017-10-05 NOTE — Telephone Encounter (Signed)
Patient came in today wanting a Rx for a shower seat for her to get in and out of the shower. She has already fallen once.  Please Advise

## 2017-10-08 NOTE — Telephone Encounter (Signed)
See request for shower seat.

## 2017-10-08 NOTE — Telephone Encounter (Signed)
Pt called to check the Rx for a seat she can put in the shower, call pt to advise

## 2017-10-09 ENCOUNTER — Other Ambulatory Visit: Payer: Self-pay

## 2017-10-09 DIAGNOSIS — W182XXA Fall in (into) shower or empty bathtub, initial encounter: Secondary | ICD-10-CM

## 2017-10-09 NOTE — Telephone Encounter (Signed)
Pt informed

## 2017-10-09 NOTE — Telephone Encounter (Signed)
Chair Rx printed and fax to [facility]. Will inform pt.

## 2017-10-10 DIAGNOSIS — H33192 Other retinoschisis and retinal cysts, left eye: Secondary | ICD-10-CM | POA: Diagnosis not present

## 2017-10-10 DIAGNOSIS — H35341 Macular cyst, hole, or pseudohole, right eye: Secondary | ICD-10-CM | POA: Diagnosis not present

## 2017-10-10 DIAGNOSIS — H43813 Vitreous degeneration, bilateral: Secondary | ICD-10-CM | POA: Diagnosis not present

## 2017-10-19 ENCOUNTER — Encounter: Payer: Self-pay | Admitting: Internal Medicine

## 2017-10-19 ENCOUNTER — Ambulatory Visit (INDEPENDENT_AMBULATORY_CARE_PROVIDER_SITE_OTHER): Payer: Medicare Other | Admitting: Internal Medicine

## 2017-10-19 VITALS — BP 122/82 | HR 64 | Temp 98.0°F | Wt 119.6 lb

## 2017-10-19 DIAGNOSIS — Z7901 Long term (current) use of anticoagulants: Secondary | ICD-10-CM | POA: Diagnosis not present

## 2017-10-19 DIAGNOSIS — R300 Dysuria: Secondary | ICD-10-CM

## 2017-10-19 DIAGNOSIS — N3000 Acute cystitis without hematuria: Secondary | ICD-10-CM | POA: Diagnosis not present

## 2017-10-19 LAB — POCT URINALYSIS DIPSTICK
Glucose, UA: NEGATIVE
Ketones, UA: NEGATIVE
NITRITE UA: NEGATIVE
PH UA: 5.5 (ref 5.0–8.0)
PROTEIN UA: POSITIVE — AB
Spec Grav, UA: >=1.03 — AB (ref 1.010–1.025)
UROBILINOGEN UA: 1 U/dL

## 2017-10-19 MED ORDER — CEFDINIR 300 MG PO CAPS: 300.0000 mg | ORAL_CAPSULE | Freq: Two times a day (BID) | ORAL | 0 refills | Status: DC

## 2017-10-19 NOTE — Progress Notes (Signed)
Chief Complaint  Patient presents with  . Urinary Frequency    Pt c/o urinary frequency, urgency, and pain. Pt denies any lower abdominal/pelvic pain and low back pain. Pt states that it burns when she urinates to he point of tears and screaming out. Pt has been using AZO x 2-3 days ago, used about about two days. Pt having decreased amount of urine flow.     HPI: [name] Small 84 y.o. come in for  sda    PCP na   Here with D in law.  Onset about a week of  avove sx and over past day painful with  Urination.  Last  uti ? 9 18 rx amox  Uses azo today and some help but not complete.   No bleeding  But on anticoagulant   ROS: See pertinent positives and negatives per HPI. No fever chills flank pain  NVD  Had left anknle injury to be seen next week dr Garret Reddish  Past Medical History:  Diagnosis Date  . Atrial fibrillation ([place])   . Bleeding stomach ulcer 1980s?  [name] Cancer ([place]) Meloanoma left leg  . DIVERTICULOSIS, COLON 11/22/2006  . Headache(784.0)    "very often; not regular" (11/25/2015)  . History of blood transfusion 1980s   "when I had the bleeding ulcer"  . HYPERLIPIDEMIA 11/22/2006  . Hypertension   . Menopausal syndrome (hot flashes)   . OSTEOPOROSIS 11/22/2006  . Presence of permanent cardiac pacemaker     Family History  Problem Relation Age of Onset  . Alzheimer's disease Sister   . Cardiomyopathy Brother     Social History   Socioeconomic History  . Marital status: Widowed    Spouse name: Not on file  . Number of children: 2  . Years of education: 58  . Highest education level: Not on file  Occupational History    Comment: retired  Scientific laboratory technician  . Financial resource strain: Not on file  . Food insecurity:    Worry: Not on file    Inability: Not on file  . Transportation needs:    Medical: Not on file    Non-medical: Not on file  Tobacco Use  . Smoking status: Never Smoker  . Smokeless tobacco: Never Used  Substance and Sexual Activity  . Alcohol use: No   Alcohol/week: 0.0 oz  . Drug use: No  . Sexual activity: Never  Lifestyle  . Physical activity:    Days per week: Not on file    Minutes per session: Not on file  . Stress: Not on file  Relationships  . Social connections:    Talks on phone: Not on file    Gets together: Not on file    Attends religious service: Not on file    Active member of club or organization: Not on file    Attends meetings of clubs or organizations: Not on file    Relationship status: Not on file  Other Topics Concern  . Not on file  Social History Narrative   Lives alone, son lives beside her   Caffeine - none    Outpatient Medications Prior to Visit  Medication Sig Dispense Refill  . acetaminophen (TYLENOL) 500 MG tablet Take 500 mg by mouth every 6 (six) hours as needed for mild pain or headache.     [name] amiodarone (PACERONE) 200 MG tablet TAKE 1 TABLET(200 MG) BY MOUTH DAILY 30 tablet 10  . atorvastatin (LIPITOR) 40 MG tablet Take 1 tablet (40 mg total)  by mouth daily. 90 tablet 0  . Calcium Carbonate-Vitamin D (CALTRATE 600+D PO) Take 1 tablet by mouth daily.      [name] losartan (COZAAR) 50 MG tablet Take 1 tablet (50 mg total) by mouth daily. 90 tablet 3  . Polyvinyl Alcohol-Povidone (REFRESH OP) Place 1 drop into both eyes 2 (two) times daily.    Alveda Reasons 15 MG TABS tablet TAKE 1 TABLET(15 MG) BY MOUTH DAILY WITH DINNER 30 tablet 5   No facility-administered medications prior to visit.      EXAM:  BP 122/82 (BP Location: Right Arm, Patient Position: Sitting, Cuff Size: Normal)   Pulse 64   Temp 98 F (36.7 C) (Oral)   Wt 119 lb 9.6 oz (54.3 kg)   BMI 19.90 kg/m   Body mass index is 19.9 kg/m.  GENERAL: vitals reviewed and listed above, alert, oriented, appears well hydrated and in no acute distress non toxic  LUNGS: clear to auscultation bilaterally, no wheezes, rales or rhonchi, good air movement CV: HRRR,? No m  no clubbing cyanosis nl cap refill  No  cva tenderness  MS: moves all  extremities without noticeable focal  Abnormality  Cane  Left ankle  slight lat swelling PSYCH: pleasant and cooperative, no obvious depression or anxiety Lab Results  Component Value Date   WBC 6.3 08/01/2017   HGB 13.7 08/01/2017   HCT 41.9 08/01/2017   PLT 221.0 08/01/2017   GLUCOSE 90 08/01/2017   CHOL 163 08/01/2017   TRIG 67.0 08/01/2017   HDL 75.50 08/01/2017   LDLDIRECT 176.0 02/24/2008   LDLCALC 74 08/01/2017   ALT 15 08/01/2017   AST 17 08/01/2017   NA 140 08/01/2017   K 4.1 08/01/2017   CL 103 08/01/2017   CREATININE 1.11 08/01/2017   BUN 13 08/01/2017   CO2 28 08/01/2017   TSH 2.13 08/01/2017   INR 2.4 08/10/2014   HGBA1C 6.1 (H) 12/07/2014   BP Readings from Last 3 Encounters:  10/19/17 122/82  08/01/17 120/80  07/31/17 (!) 146/70    ASSESSMENT AND PLAN:  Discussed the following assessment and plan:  Acute cystitis  - Plan: Urine Culture  Burning with urination - Plan: POC Urinalysis Dipstick, Urine Culture  Anticoagulant long-term use  -Patient advised to return or notify health care team  if  new concerns arise.  Patient Instructions  Take antibiotic  Or uti    Should be feeling better in the next 48 - 72 hours   conact the team if not better next week or as needed.  Will be contacted about  culture results when  Back     Urinary Tract Infection, Adult A urinary tract infection (UTI) is an infection of any part of the urinary tract, which includes the kidneys, ureters, bladder, and urethra. These organs make, store, and get rid of urine in the body. UTI can be a bladder infection (cystitis) or kidney infection (pyelonephritis). What are the causes? This infection may be caused by fungi, viruses, or bacteria. Bacteria are the most common cause of UTIs. This condition can also be caused by repeated incomplete emptying of the bladder during urination. What increases the risk? This condition is more likely to develop if:  You ignore your need to  urinate or hold urine for long periods of time.  You do not empty your bladder completely during urination.  You wipe back to front after urinating or having a bowel movement, if you are female.  You are uncircumcised, if you  are female.  You are constipated.  You have a urinary catheter that stays in place (indwelling).  You have a weak defense (immune) system.  You have a medical condition that affects your bowels, kidneys, or bladder.  You have diabetes.  You take antibiotic medicines frequently or for long periods of time, and the antibiotics no longer work well against certain types of infections (antibiotic resistance).  You take medicines that irritate your urinary tract.  You are exposed to chemicals that irritate your urinary tract.  You are female.  What are the signs or symptoms? Symptoms of this condition include:  Fever.  Frequent urination or passing small amounts of urine frequently.  Needing to urinate urgently.  Pain or burning with urination.  Urine that smells bad or unusual.  Cloudy urine.  Pain in the lower abdomen or back.  Trouble urinating.  Blood in the urine.  Vomiting or being less hungry than normal.  Diarrhea or abdominal pain.  Vaginal discharge, if you are female.  How is this diagnosed? This condition is diagnosed with a medical history and physical exam. You will also need to provide a urine sample to test your urine. Other tests may be done, including:  Blood tests.  Sexually transmitted disease (STD) testing.  If you have had more than one UTI, a cystoscopy or imaging studies may be done to determine the cause of the infections. How is this treated? Treatment for this condition often includes a combination of two or more of the following:  Antibiotic medicine.  Other medicines to treat less common causes of UTI.  Over-the-counter medicines to treat pain.  Drinking enough water to stay hydrated.  Follow these  instructions at home:  Take over-the-counter and prescription medicines only as told by your health care provider.  If you were prescribed an antibiotic, take it as told by your health care provider. Do not stop taking the antibiotic even if you start to feel better.  Avoid alcohol, caffeine, tea, and carbonated beverages. They can irritate your bladder.  Drink enough fluid to keep your urine clear or pale yellow.  Keep all follow-up visits as told by your health care provider. This is important.  Make sure to: ? Empty your bladder often and completely. Do not hold urine for long periods of time. ? Empty your bladder before and after sex. ? Wipe from front to back after a bowel movement if you are female. Use each tissue one time when you wipe. Contact a health care provider if:  You have back pain.  You have a fever.  You feel nauseous or vomit.  Your symptoms do not get better after 3 days.  Your symptoms go away and then return. Get help right away if:  You have severe back pain or lower abdominal pain.  You are vomiting and cannot keep down any medicines or water. This information is not intended to replace advice given to you by your health care provider. Make sure you discuss any questions you have with your health care provider. Document Released: 02/15/2005 Document Revised: 10/20/2015 Document Reviewed: 03/29/2015 Elsevier Interactive Patient Education  2018 [place]. Coty Student M.D.

## 2017-10-19 NOTE — Patient Instructions (Signed)
Take antibiotic  Or uti    Should be feeling better in the next 48 - 72 hours   conact the team if not better next week or as needed.  Will be contacted about  culture results when  Back     Urinary Tract Infection, Adult A urinary tract infection (UTI) is an infection of any part of the urinary tract, which includes the kidneys, ureters, bladder, and urethra. These organs make, store, and get rid of urine in the body. UTI can be a bladder infection (cystitis) or kidney infection (pyelonephritis). What are the causes? This infection may be caused by fungi, viruses, or bacteria. Bacteria are the most common cause of UTIs. This condition can also be caused by repeated incomplete emptying of the bladder during urination. What increases the risk? This condition is more likely to develop if:  You ignore your need to urinate or hold urine for long periods of time.  You do not empty your bladder completely during urination.  You wipe back to front after urinating or having a bowel movement, if you are female.  You are uncircumcised, if you are female.  You are constipated.  You have a urinary catheter that stays in place (indwelling).  You have a weak defense (immune) system.  You have a medical condition that affects your bowels, kidneys, or bladder.  You have diabetes.  You take antibiotic medicines frequently or for long periods of time, and the antibiotics no longer work well against certain types of infections (antibiotic resistance).  You take medicines that irritate your urinary tract.  You are exposed to chemicals that irritate your urinary tract.  You are female.  What are the signs or symptoms? Symptoms of this condition include:  Fever.  Frequent urination or passing small amounts of urine frequently.  Needing to urinate urgently.  Pain or burning with urination.  Urine that smells bad or unusual.  Cloudy urine.  Pain in the lower abdomen or back.  Trouble  urinating.  Blood in the urine.  Vomiting or being less hungry than normal.  Diarrhea or abdominal pain.  Vaginal discharge, if you are female.  How is this diagnosed? This condition is diagnosed with a medical history and physical exam. You will also need to provide a urine sample to test your urine. Other tests may be done, including:  Blood tests.  Sexually transmitted disease (STD) testing.  If you have had more than one UTI, a cystoscopy or imaging studies may be done to determine the cause of the infections. How is this treated? Treatment for this condition often includes a combination of two or more of the following:  Antibiotic medicine.  Other medicines to treat less common causes of UTI.  Over-the-counter medicines to treat pain.  Drinking enough water to stay hydrated.  Follow these instructions at home:  Take over-the-counter and prescription medicines only as told by your health care provider.  If you were prescribed an antibiotic, take it as told by your health care provider. Do not stop taking the antibiotic even if you start to feel better.  Avoid alcohol, caffeine, tea, and carbonated beverages. They can irritate your bladder.  Drink enough fluid to keep your urine clear or pale yellow.  Keep all follow-up visits as told by your health care provider. This is important.  Make sure to: ? Empty your bladder often and completely. Do not hold urine for long periods of time. ? Empty your bladder before and after sex. ? Wipe  from front to back after a bowel movement if you are female. Use each tissue one time when you wipe. Contact a health care provider if:  You have back pain.  You have a fever.  You feel nauseous or vomit.  Your symptoms do not get better after 3 days.  Your symptoms go away and then return. Get help right away if:  You have severe back pain or lower abdominal pain.  You are vomiting and cannot keep down any medicines or  water. This information is not intended to replace advice given to you by your health care provider. Make sure you discuss any questions you have with your health care provider. Document Released: 02/15/2005 Document Revised: 10/20/2015 Document Reviewed: 03/29/2015 Elsevier Interactive Patient Education  [employer].

## 2017-10-20 LAB — URINE CULTURE
MICRO NUMBER:: 90658014
SPECIMEN QUALITY: ADEQUATE

## 2017-10-24 ENCOUNTER — Ambulatory Visit (INDEPENDENT_AMBULATORY_CARE_PROVIDER_SITE_OTHER): Payer: Medicare Other

## 2017-10-24 ENCOUNTER — Encounter (INDEPENDENT_AMBULATORY_CARE_PROVIDER_SITE_OTHER): Payer: Self-pay | Admitting: Orthopedic Surgery

## 2017-10-24 ENCOUNTER — Telehealth (INDEPENDENT_AMBULATORY_CARE_PROVIDER_SITE_OTHER): Payer: Self-pay | Admitting: Orthopedic Surgery

## 2017-10-24 ENCOUNTER — Ambulatory Visit (INDEPENDENT_AMBULATORY_CARE_PROVIDER_SITE_OTHER): Payer: Medicare Other | Admitting: Orthopedic Surgery

## 2017-10-24 DIAGNOSIS — S72142S Displaced intertrochanteric fracture of left femur, sequela: Secondary | ICD-10-CM

## 2017-10-24 DIAGNOSIS — M25552 Pain in left hip: Secondary | ICD-10-CM | POA: Diagnosis not present

## 2017-10-24 NOTE — Progress Notes (Signed)
Office Visit Note   Patient: [name] Small           Date of Birth: 29-Jan-1929           MRN: [id number] Visit Date: 10/24/2017 Requested by: Marletta Lor, MD [place], [place] [address] PCP: Marletta Lor, MD  Subjective: Chief Complaint  Patient presents with  . Left Hip - Pain    HPI: [name] Small is an 84 year old patient with left hip pain.  A few weeks ago she "slid down" and has been having some pain laterally since then with walking.  Been taking Tylenol for her pain.  She had hip fracture treated with IM at bedtime February 2016.  She is using a cane.  She also uses a walker at times.              ROS: All systems reviewed are negative as they relate to the chief complaint within the history of present illness.  Patient denies  fevers or chills.   Assessment & Plan: Visit Diagnoses:  1. Left hip pain   2. Displaced intertrochanteric fracture of left femur, sequela     Plan: Impression is new onset left hip pain with normal radiographs and fairly normal exam.  Still has a little bit of weakness with hip flexion but no nerve root tension signs.  I think this is likely a bruise.  Her mechanism of injury is somewhat underwhelming.  I would continue with conservative treatment including heating pad to the affected area and Tylenol.  If symptoms do not improve within 6 to 8 weeks we can consider repeat evaluation.  Follow-Up Instructions: Return if symptoms worsen or fail to improve.   Orders:  Orders Placed This Encounter  Procedures  . XR HIP UNILAT W OR W/O PELVIS 2-3 VIEWS LEFT   No orders of the defined types were placed in this encounter.     Procedures: No procedures performed   Clinical Data: No additional findings.  Objective: Vital Signs: There were no vitals taken for this visit.  Physical Exam:   Constitutional: Patient appears well-developed HEENT:  Head: Normocephalic Eyes:EOM are normal Neck: Normal range of  motion Cardiovascular: Normal rate Pulmonary/chest: Effort normal Neurologic: Patient is alert Skin: Skin is warm Psychiatric: Patient has normal mood and affect    Ortho Exam: Orthopedic exam demonstrates slightly antalgic gait to the left.  No discrete groin pain with internal/external rotation of the left leg.  Hip flexion strength is about 5- out of 5 on the left compared to the right.  Pedal pulses palpable.  No nerve root tension signs.  Patient has good ankle dorsiflexion plantarflexion quad and hamstring strength.  Specialty Comments:  No specialty comments available.  Imaging: Xr Hip Unilat W Or W/o Pelvis 2-3 Views Left  Result Date: 10/24/2017 AP pelvis lateral left hip reviewed.  Intertrochanteric fracture fixation with healed fracture is present.  There is no hardware complication.  No new fracture.  Bones are osteopenic.  No rami fracture or sacral fracture visualized.    PMFS History: Patient Active Problem List   Diagnosis Date Noted  . Hyperglycemia 12/07/2014  . Atrial fibrillation with RVR-CHADs VASc=4 12/07/2014  . Intertrochanteric fracture of left hip ([place]) 07/08/2014  . Overactive bladder 04/02/2012  . Essential hypertension 04/02/2012  . Chest pain, atypical 09/17/2011  . Dyslipidemia 11/22/2006  . DIVERTICULOSIS, COLON 11/22/2006  . Osteoporosis 11/22/2006   Past Medical History:  Diagnosis Date  . Atrial fibrillation ([place])   .  Bleeding stomach ulcer 1980s?  [name] Cancer ([place]) Meloanoma left leg  . DIVERTICULOSIS, COLON 11/22/2006  . Headache(784.0)    "very often; not regular" (11/25/2015)  . History of blood transfusion 1980s   "when I had the bleeding ulcer"  . HYPERLIPIDEMIA 11/22/2006  . Hypertension   . Menopausal syndrome (hot flashes)   . OSTEOPOROSIS 11/22/2006  . Presence of permanent cardiac pacemaker     Family History  Problem Relation Age of Onset  . Alzheimer's disease Sister   . Cardiomyopathy Brother     Past Surgical History:   Procedure Laterality Date  . APPENDECTOMY    . BUNIONECTOMY Right   . CATARACT EXTRACTION W/ INTRAOCULAR LENS  IMPLANT, BILATERAL Bilateral 2000s  . CHOLECYSTECTOMY OPEN    . EP IMPLANTABLE DEVICE N/A 11/25/2015   Procedure: Pacemaker Implant;  Surgeon: Will Meredith Leeds, MD;  Location: [facility];  Service: Cardiovascular;  Laterality: N/A;  . FEMUR IM NAIL Left 07/08/2014   Procedure: INTRAMEDULLARY (IM) NAIL ;  Surgeon: Meredith Pel, MD;  Location: [facility];  Service: Orthopedics;  Laterality: Left;  . FRACTURE SURGERY    . REVISION TOTAL HIP ARTHROPLASTY Left 06/2014   Social History   Occupational History    Comment: retired  Tobacco Use  . Smoking status: Never Smoker  . Smokeless tobacco: Never Used  Substance and Sexual Activity  . Alcohol use: No    Alcohol/week: 0.0 oz  . Drug use: No  . Sexual activity: Never

## 2017-10-24 NOTE — Telephone Encounter (Signed)
error 

## 2017-10-30 NOTE — Progress Notes (Signed)
Tell pt urine culture showed multiple bacteria but no  uti germ  If ongoing  Symptoms need to see your  PCP

## 2017-11-12 ENCOUNTER — Ambulatory Visit (INDEPENDENT_AMBULATORY_CARE_PROVIDER_SITE_OTHER): Payer: Medicare Other | Admitting: Internal Medicine

## 2017-11-12 ENCOUNTER — Encounter: Payer: Self-pay | Admitting: Internal Medicine

## 2017-11-12 VITALS — BP 120/78 | HR 60 | Temp 97.9°F | Wt 119.0 lb

## 2017-11-12 DIAGNOSIS — R350 Frequency of micturition: Secondary | ICD-10-CM

## 2017-11-12 LAB — POCT URINALYSIS DIPSTICK
BILIRUBIN UA: NEGATIVE
Blood, UA: NEGATIVE
GLUCOSE UA: NEGATIVE
Ketones, UA: NEGATIVE
Nitrite, UA: NEGATIVE
Protein, UA: POSITIVE — AB
Spec Grav, UA: >=1.03 — AB (ref 1.010–1.025)
UROBILINOGEN UA: 0.2 U/dL
pH, UA: 6 (ref 5.0–8.0)

## 2017-11-12 NOTE — Progress Notes (Signed)
Subjective:    Patient ID: [name] Small, female    DOB: 03-26-1929, 84 y.o.   MRN: [id number]  HPI  84 year old patient who is seen today with 2 concerns.  Her chief complaint is pain in the right upper mid back area that she has had about for 1 week.  Pain seems to be worse when she is upright and sitting and seems to be alleviated somewhat by laying flat.  No history of trauma.  Pain is described as fairly localized and sharp.  Pain is not aggravated by other activities such as bending stooping or deep inspiration  She also complains of some urinary frequency and occasional nocturia she has occasional episodes of urgency with incontinence she was seen last month for a possible UTI and urine revealed multiple specimens  Past Medical History:  Diagnosis Date  . Atrial fibrillation ([place])   . Bleeding stomach ulcer 1980s?  [name] Cancer ([place]) Meloanoma left leg  . DIVERTICULOSIS, COLON 11/22/2006  . Headache(784.0)    "very often; not regular" (11/25/2015)  . History of blood transfusion 1980s   "when I had the bleeding ulcer"  . HYPERLIPIDEMIA 11/22/2006  . Hypertension   . Menopausal syndrome (hot flashes)   . OSTEOPOROSIS 11/22/2006  . Presence of permanent cardiac pacemaker      Social History   Socioeconomic History  . Marital status: Widowed    Spouse name: Not on file  . Number of children: 2  . Years of education: 23  . Highest education level: Not on file  Occupational History    Comment: retired  Scientific laboratory technician  . Financial resource strain: Not on file  . Food insecurity:    Worry: Not on file    Inability: Not on file  . Transportation needs:    Medical: Not on file    Non-medical: Not on file  Tobacco Use  . Smoking status: Never Smoker  . Smokeless tobacco: Never Used  Substance and Sexual Activity  . Alcohol use: No    Alcohol/week: 0.0 oz  . Drug use: No  . Sexual activity: Never  Lifestyle  . Physical activity:    Days per week: Not on file    Minutes per  session: Not on file  . Stress: Not on file  Relationships  . Social connections:    Talks on phone: Not on file    Gets together: Not on file    Attends religious service: Not on file    Active member of club or organization: Not on file    Attends meetings of clubs or organizations: Not on file    Relationship status: Not on file  . Intimate partner violence:    Fear of current or ex partner: Not on file    Emotionally abused: Not on file    Physically abused: Not on file    Forced sexual activity: Not on file  Other Topics Concern  . Not on file  Social History Narrative   Lives alone, son lives beside her   Caffeine - none    Past Surgical History:  Procedure Laterality Date  . APPENDECTOMY    . BUNIONECTOMY Right   . CATARACT EXTRACTION W/ INTRAOCULAR LENS  IMPLANT, BILATERAL Bilateral 2000s  . CHOLECYSTECTOMY OPEN    . EP IMPLANTABLE DEVICE N/A 11/25/2015   Procedure: Pacemaker Implant;  Surgeon: Will Meredith Leeds, MD;  Location: [facility];  Service: Cardiovascular;  Laterality: N/A;  . FEMUR IM NAIL Left 07/08/2014  Procedure: INTRAMEDULLARY (IM) NAIL ;  Surgeon: Meredith Pel, MD;  Location: [facility];  Service: Orthopedics;  Laterality: Left;  . FRACTURE SURGERY    . REVISION TOTAL HIP ARTHROPLASTY Left 06/2014    Family History  Problem Relation Age of Onset  . Alzheimer's disease Sister   . Cardiomyopathy Brother     Allergies  Allergen Reactions  . Septra Ds [Sulfamethoxazole-Trimethoprim]   . Lisinopril Other (See Comments)    Cough    Current Outpatient Medications on File Prior to Visit  Medication Sig Dispense Refill  . acetaminophen (TYLENOL) 500 MG tablet Take 500 mg by mouth every 6 (six) hours as needed for mild pain or headache.     [name] amiodarone (PACERONE) 200 MG tablet TAKE 1 TABLET(200 MG) BY MOUTH DAILY 30 tablet 10  . atorvastatin (LIPITOR) 40 MG tablet Take 1 tablet (40 mg total) by mouth daily. 90 tablet 0  . Calcium  Carbonate-Vitamin D (CALTRATE 600+D PO) Take 1 tablet by mouth daily.      . cefdinir (OMNICEF) 300 MG capsule Take 1 capsule (300 mg total) by mouth 2 (two) times daily. 10 capsule 0  . losartan (COZAAR) 50 MG tablet Take 1 tablet (50 mg total) by mouth daily. 90 tablet 3  . Polyvinyl Alcohol-Povidone (REFRESH OP) Place 1 drop into both eyes 2 (two) times daily.    Alveda Reasons 15 MG TABS tablet TAKE 1 TABLET(15 MG) BY MOUTH DAILY WITH DINNER 30 tablet 5  . [DISCONTINUED] dronedarone (MULTAQ) 400 MG tablet Take 1 tablet (400 mg total) by mouth daily.     No current facility-administered medications on file prior to visit.     BP 120/78 (BP Location: Right Arm, Patient Position: Sitting, Cuff Size: Normal)   Pulse 60   Temp 97.9 F (36.6 C) (Oral)   Wt 119 lb (54 kg)   SpO2 99%   BMI 19.80 kg/m     Review of Systems  Constitutional: Negative.   HENT: Negative for congestion, dental problem, hearing loss, rhinorrhea, sinus pressure, sore throat and tinnitus.   Eyes: Negative for pain, discharge and visual disturbance.  Respiratory: Negative for cough and shortness of breath.   Cardiovascular: Negative for chest pain, palpitations and leg swelling.  Gastrointestinal: Negative for abdominal distention, abdominal pain, blood in stool, constipation, diarrhea, nausea and vomiting.  Genitourinary: Positive for frequency and urgency. Negative for difficulty urinating, dysuria, flank pain, hematuria, pelvic pain, vaginal bleeding, vaginal discharge and vaginal pain.  Musculoskeletal: Positive for back pain. Negative for arthralgias, gait problem and joint swelling.  Skin: Negative for rash.  Neurological: Negative for dizziness, syncope, speech difficulty, weakness, numbness and headaches.  Hematological: Negative for adenopathy.  Psychiatric/Behavioral: Negative for agitation, behavioral problems and dysphoric mood. The patient is not nervous/anxious.        Objective:   Physical Exam    Constitutional: She is oriented to person, place, and time. She appears well-developed and well-nourished. No distress.  Does not appear to be a in any distress  HENT:  Head: Normocephalic.  Right Ear: External ear normal.  Left Ear: External ear normal.  Mouth/Throat: Oropharynx is clear and moist.  Eyes: Pupils are equal, round, and reactive to light. Conjunctivae and EOM are normal.  Neck: Normal range of motion. Neck supple. No thyromegaly present.  Cardiovascular: Normal rate, regular rhythm, normal heart sounds and intact distal pulses.  No tachycardia  Pulmonary/Chest: Effort normal and breath sounds normal.  Chest is clear with symmetrical breath sounds.  No rub   Abdominal: Soft. Bowel sounds are normal. She exhibits no mass. There is no tenderness.  Musculoskeletal: Normal range of motion.  No rash or tenderness involving the right upper mid back area  Lymphadenopathy:    She has no cervical adenopathy.  Neurological: She is alert and oriented to person, place, and time.  Skin: Skin is warm and dry. No rash noted.  Psychiatric: She has a normal mood and affect. Her behavior is normal.          Assessment & Plan:   Right upper mid back pain.  Suspect chest wall pain some relief with Tylenol Hypertension Possible overactive bladder.  Patient information dispensed.  Will review a urine culture  Marletta Lor

## 2017-11-12 NOTE — Patient Instructions (Addendum)
Tylenol 2 extra strength tablets (total 650 mg) 3 times daily   Overactive Bladder, Adult Overactive bladder is a group of urinary symptoms. With overactive bladder, you may suddenly feel the need to pass urine (urinate) right away. After feeling this sudden urge, you might also leak urine if you cannot get to the bathroom fast enough (urinary incontinence). These symptoms might interfere with your daily work or social activities. Overactive bladder symptoms may also wake you up at night. Overactive bladder affects the nerve signals between your bladder and your brain. Your bladder may get the signal to empty before it is full. Very sensitive muscles can also make your bladder squeeze too soon. What are the causes? Many things can cause an overactive bladder. Possible causes include:  Urinary tract infection.  Infection of nearby tissues, such as the prostate.  Prostate enlargement.  Being pregnant with twins or more (multiples).  Surgery on the uterus or urethra.  Bladder stones, inflammation, or tumors.  Drinking too much caffeine or alcohol.  Certain medicines, especially those that you take to help your body get rid of extra fluid (diuretics) by increasing urine production.  Muscle or nerve weakness, especially from: ? A spinal cord injury. ? Stroke. ? Multiple sclerosis. ? Parkinson disease.  Diabetes. This can cause a high urine volume that fills the bladder so quickly that the normal urge to urinate is triggered very strongly.  Constipation. A buildup of too much stool can put pressure on your bladder.  What increases the risk? You may be at greater risk for overactive bladder if you:  Are an older adult.  Smoke.  Are going through menopause.  Have prostate problems.  Have a neurological disease, such as stroke, dementia, Parkinson disease, or multiple sclerosis (MS).  Eat or drink things that irritate the bladder. These include alcohol, spicy food, and  caffeine.  Are overweight or obese.  What are the signs or symptoms? The signs and symptoms of an overactive bladder include:  Sudden, strong urges to urinate.  Leaking urine.  Urinating eight or more times per day.  Waking up to urinate two or more times per night.  How is this diagnosed? Your health care provider may suspect overactive bladder based on your symptoms. The health care provider will do a physical exam and take your medical history. Blood or urine tests may also be done. For example, you might need to have a bladder function test to check how well you can hold your urine. You might also need to see a health care provider who specializes in the urinary tract (urologist). How is this treated? Treatment for overactive bladder depends on the cause of your condition and whether it is mild or severe. Certain treatments can be done in your health care provider's office or clinic. You can also make lifestyle changes at home. Options include: Behavioral Treatments  Biofeedback. A specialist uses sensors to help you become aware of your body's signals.  Keeping a daily log of when you need to urinate and what happens after the urge. This may help you manage your condition.  Bladder training. This helps you learn to control the urge to urinate by following a schedule that directs you to urinate at regular intervals (timed voiding). At first, you might have to wait a few minutes after feeling the urge. In time, you should be able to schedule bathroom visits an hour or more apart.  Kegel exercises. These are exercises to strengthen the pelvic floor muscles, which support  the bladder. Toning these muscles can help you control urination, even if your bladder muscles are overactive. A specialist will teach you how to do these exercises correctly. They require daily practice.  Weight loss. If you are obese or overweight, losing weight might relieve your symptoms of overactive bladder. Talk  to your health care provider about losing weight and whether there is a specific program or method that would work best for you.  Diet change. This might help if constipation is making your overactive bladder worse. Your health care provider or a dietitian can explain ways to change what you eat to ease constipation. You might also need to consume less alcohol and caffeine or drink other fluids at different times of the day.  Stopping smoking.  Wearing pads to absorb leakage while you wait for other treatments to take effect. Physical Treatments  Electrical stimulation. Electrodes send gentle pulses of electricity to strengthen the nerves or muscles that help to control the bladder. Sometimes, the electrodes are placed outside of the body. In other cases, they might be placed inside the body (implanted). This treatment can take several months to have an effect.  Supportive devices. Women may need a plastic device that fits into the vagina and supports the bladder (pessary). Medicines Several medicines can help treat overactive bladder and are usually used along with other treatments. Some are injected into the muscles involved in urination. Others come in pill form. Your health care provider may prescribe:  Antispasmodics. These medicines block the signals that the nerves send to the bladder. This keeps the bladder from releasing urine at the wrong time.  Tricyclic antidepressants. These types of antidepressants also relax bladder muscles.  Surgery  You may have a device implanted to help manage the nerve signals that indicate when you need to urinate.  You may have surgery to implant electrodes for electrical stimulation.  Sometimes, very severe cases of overactive bladder require surgery to change the shape of the bladder. Follow these instructions at home:  Take medicines only as directed by your health care provider.  Use any implants or a pessary as directed by your health care  provider.  Make any diet or lifestyle changes that are recommended by your health care provider. These might include: ? Drinking less fluid or drinking at different times of the day. If you need to urinate often during the night, you may need to stop drinking fluids early in the evening. ? Cutting down on caffeine or alcohol. Both can make an overactive bladder worse. Caffeine is found in coffee, tea, and sodas. ? Doing Kegel exercises to strengthen muscles. ? Losing weight if you need to. ? Eating a healthy and balanced diet to prevent constipation.  Keep a journal or log to track how much and when you drink and also when you feel the need to urinate. This will help your health care provider to monitor your condition. Contact a health care provider if:  Your symptoms do not get better after treatment.  Your pain and discomfort are getting worse.  You have more frequent urges to urinate.  You have a fever. Get help right away if: You are not able to control your bladder at all. This information is not intended to replace advice given to you by your health care provider. Make sure you discuss any questions you have with your health care provider. Document Released: 03/04/2009 Document Revised: 10/14/2015 Document Reviewed: 10/01/2013 Elsevier Interactive Patient Education  [employer].

## 2017-11-14 LAB — URINE CULTURE
MICRO NUMBER:: 90753189
SPECIMEN QUALITY:: ADEQUATE

## 2017-11-21 ENCOUNTER — Other Ambulatory Visit: Payer: Self-pay | Admitting: Cardiovascular Disease

## 2017-11-26 DIAGNOSIS — Z1231 Encounter for screening mammogram for malignant neoplasm of breast: Secondary | ICD-10-CM | POA: Diagnosis not present

## 2017-11-26 LAB — HM MAMMOGRAPHY

## 2017-11-27 ENCOUNTER — Encounter: Payer: Self-pay | Admitting: Internal Medicine

## 2017-12-04 ENCOUNTER — Other Ambulatory Visit: Payer: Self-pay | Admitting: Internal Medicine

## 2017-12-17 ENCOUNTER — Ambulatory Visit (INDEPENDENT_AMBULATORY_CARE_PROVIDER_SITE_OTHER): Payer: Medicare Other | Admitting: *Deleted

## 2017-12-17 ENCOUNTER — Telehealth: Payer: Self-pay | Admitting: Cardiology

## 2017-12-17 DIAGNOSIS — I495 Sick sinus syndrome: Secondary | ICD-10-CM

## 2017-12-17 NOTE — Telephone Encounter (Signed)
Spoke with pt and reminded pt of remote transmission that is due today. Pt verbalized understanding.   

## 2017-12-18 ENCOUNTER — Encounter: Payer: Self-pay | Admitting: Cardiology

## 2017-12-18 NOTE — Progress Notes (Signed)
Remote pacemaker transmission.   

## 2017-12-21 LAB — CUP PACEART REMOTE DEVICE CHECK
Brady Statistic AP VP Percent: 0.06 %
Brady Statistic RA Percent Paced: 96.85 %
Brady Statistic RV Percent Paced: 0.06 %
Date Time Interrogation Session: 20190729213717
Implantable Lead Implant Date: 20170706
Implantable Lead Location: 753859
Implantable Lead Model: 5076
Implantable Pulse Generator Implant Date: 20170706
Lead Channel Impedance Value: 342 Ohm
Lead Channel Impedance Value: 475 Ohm
Lead Channel Impedance Value: 646 Ohm
Lead Channel Pacing Threshold Amplitude: 1.125 V
Lead Channel Pacing Threshold Pulse Width: 0.4 ms
Lead Channel Pacing Threshold Pulse Width: 0.4 ms
Lead Channel Sensing Intrinsic Amplitude: 21.125 mV
Lead Channel Sensing Intrinsic Amplitude: 21.125 mV
Lead Channel Setting Pacing Amplitude: 1.75 V
Lead Channel Setting Pacing Amplitude: 2.5 V
Lead Channel Setting Pacing Pulse Width: 0.4 ms
MDC IDC LEAD IMPLANT DT: 20170706
MDC IDC LEAD LOCATION: 753860
MDC IDC MSMT BATTERY REMAINING LONGEVITY: 101 mo
MDC IDC MSMT BATTERY VOLTAGE: 3.02 V
MDC IDC MSMT LEADCHNL RA PACING THRESHOLD AMPLITUDE: 0.875 V
MDC IDC MSMT LEADCHNL RA SENSING INTR AMPL: 2 mV
MDC IDC MSMT LEADCHNL RA SENSING INTR AMPL: 2 mV
MDC IDC MSMT LEADCHNL RV IMPEDANCE VALUE: 437 Ohm
MDC IDC SET LEADCHNL RV SENSING SENSITIVITY: 2.8 mV
MDC IDC STAT BRADY AP VS PERCENT: 96.81 %
MDC IDC STAT BRADY AS VP PERCENT: 0.01 %
MDC IDC STAT BRADY AS VS PERCENT: 3.12 %

## 2018-01-25 ENCOUNTER — Ambulatory Visit (INDEPENDENT_AMBULATORY_CARE_PROVIDER_SITE_OTHER): Payer: Medicare Other

## 2018-01-25 ENCOUNTER — Encounter (INDEPENDENT_AMBULATORY_CARE_PROVIDER_SITE_OTHER): Payer: Self-pay | Admitting: Family Medicine

## 2018-01-25 ENCOUNTER — Ambulatory Visit (INDEPENDENT_AMBULATORY_CARE_PROVIDER_SITE_OTHER): Payer: Medicare Other | Admitting: Family Medicine

## 2018-01-25 DIAGNOSIS — M25552 Pain in left hip: Secondary | ICD-10-CM | POA: Diagnosis not present

## 2018-01-25 MED ORDER — DICLOFENAC SODIUM 1 % TD GEL: 4.0000 g | Freq: Four times a day (QID) | TRANSDERMAL | 6 refills | Status: DC | PRN

## 2018-01-25 NOTE — Progress Notes (Signed)
Office Visit Note   Patient: [name] Small           Date of Birth: Apr 15, 1929           MRN: [id number] Visit Date: 01/25/2018 Requested by: Marletta Lor, MD [place], [place] [address] PCP: Marletta Lor, MD  Subjective: Chief Complaint  Patient presents with  . Left Thigh - Pain    Pain from left hip to knee, s/p fall 1 month ago. Worse x 1 week.  . Left Hip - Pain    HPI: She is an 84 year old with left lateral hip pain.  Symptoms started about a month ago, she fell backward and landed on her buttocks.  With help, she was able to get back up and walk, but she has been dealing pain with walking ever since then.  She had hip fracture ORIF several years ago.  She uses a cane for ambulation.               ROS: Otherwise noncontributory.  Objective: Vital Signs: There were no vitals taken for this visit.  Physical Exam:  Left hip:  Good ROM with minimal pain on internal rotation.  Tender over the greater trochanter.  Good strength with flexion, abduction and adduction against resistance.  Imaging: 2 view x-rays left hip: Surgical hardware intact, no sign of loosening.  No hip fracture seen.  Assessment & Plan: 1.  1 month status post fall with left hip contusion -Trial of physical therapy.  I will ask her cardiologist if she can try Voltaren gel for a short period of time.  If symptoms persist, possibly greater trochanter injection.   Follow-Up Instructions: No follow-ups on file.     Procedures: None today.   PMFS History: Patient Active Problem List   Diagnosis Date Noted  . Hyperglycemia 12/07/2014  . Atrial fibrillation with RVR-CHADs VASc=4 12/07/2014  . Intertrochanteric fracture of left hip ([place]) 07/08/2014  . Overactive bladder 04/02/2012  . Essential hypertension 04/02/2012  . Chest pain, atypical 09/17/2011  . Dyslipidemia 11/22/2006  . DIVERTICULOSIS, COLON 11/22/2006  . Osteoporosis 11/22/2006   Past Medical History:    Diagnosis Date  . Atrial fibrillation ([place])   . Bleeding stomach ulcer 1980s?  [name] Cancer ([place]) Meloanoma left leg  . DIVERTICULOSIS, COLON 11/22/2006  . Headache(784.0)    "very often; not regular" (11/25/2015)  . History of blood transfusion 1980s   "when I had the bleeding ulcer"  . HYPERLIPIDEMIA 11/22/2006  . Hypertension   . Menopausal syndrome (hot flashes)   . OSTEOPOROSIS 11/22/2006  . Presence of permanent cardiac pacemaker     Family History  Problem Relation Age of Onset  . Alzheimer's disease Sister   . Cardiomyopathy Brother     Past Surgical History:  Procedure Laterality Date  . APPENDECTOMY    . BUNIONECTOMY Right   . CATARACT EXTRACTION W/ INTRAOCULAR LENS  IMPLANT, BILATERAL Bilateral 2000s  . CHOLECYSTECTOMY OPEN    . EP IMPLANTABLE DEVICE N/A 11/25/2015   Procedure: Pacemaker Implant;  Surgeon: Will Meredith Leeds, MD;  Location: [facility];  Service: Cardiovascular;  Laterality: N/A;  . FEMUR IM NAIL Left 07/08/2014   Procedure: INTRAMEDULLARY (IM) NAIL ;  Surgeon: Meredith Pel, MD;  Location: [facility];  Service: Orthopedics;  Laterality: Left;  . FRACTURE SURGERY    . REVISION TOTAL HIP ARTHROPLASTY Left 06/2014   Social History   Occupational History    Comment: retired  Tobacco  Use  . Smoking status: Never Smoker  . Smokeless tobacco: Never Used  Substance and Sexual Activity  . Alcohol use: No    Alcohol/week: 0.0 standard drinks  . Drug use: No  . Sexual activity: Never

## 2018-02-27 ENCOUNTER — Ambulatory Visit: Payer: Medicare Other | Admitting: *Deleted

## 2018-02-27 DIAGNOSIS — Z23 Encounter for immunization: Secondary | ICD-10-CM

## 2018-03-18 ENCOUNTER — Ambulatory Visit (INDEPENDENT_AMBULATORY_CARE_PROVIDER_SITE_OTHER): Payer: Medicare Other | Admitting: *Deleted

## 2018-03-18 ENCOUNTER — Telehealth: Payer: Self-pay | Admitting: Cardiology

## 2018-03-18 DIAGNOSIS — I4819 Other persistent atrial fibrillation: Secondary | ICD-10-CM

## 2018-03-18 DIAGNOSIS — I495 Sick sinus syndrome: Secondary | ICD-10-CM | POA: Diagnosis not present

## 2018-03-18 NOTE — Telephone Encounter (Signed)
Spoke with pt and reminded pt of remote transmission that is due today. Pt verbalized understanding.   

## 2018-03-19 NOTE — Progress Notes (Signed)
Remote pacemaker transmission.   

## 2018-03-21 ENCOUNTER — Other Ambulatory Visit: Payer: Self-pay | Admitting: Cardiology

## 2018-03-29 ENCOUNTER — Ambulatory Visit (INDEPENDENT_AMBULATORY_CARE_PROVIDER_SITE_OTHER): Payer: Medicare Other | Admitting: Family Medicine

## 2018-03-29 ENCOUNTER — Encounter: Payer: Self-pay | Admitting: Family Medicine

## 2018-03-29 VITALS — BP 122/60 | HR 68 | Temp 97.5°F | Wt 118.0 lb

## 2018-03-29 DIAGNOSIS — I10 Essential (primary) hypertension: Secondary | ICD-10-CM | POA: Diagnosis not present

## 2018-03-29 DIAGNOSIS — I4891 Unspecified atrial fibrillation: Secondary | ICD-10-CM | POA: Diagnosis not present

## 2018-03-29 DIAGNOSIS — M25552 Pain in left hip: Secondary | ICD-10-CM

## 2018-03-29 DIAGNOSIS — E785 Hyperlipidemia, unspecified: Secondary | ICD-10-CM

## 2018-03-29 NOTE — Patient Instructions (Signed)
Managing Your Hypertension Hypertension is commonly called high blood pressure. This is when the force of your blood pressing against the walls of your arteries is too strong. Arteries are blood vessels that carry blood from your heart throughout your body. Hypertension forces the heart to work harder to pump blood, and may cause the arteries to become narrow or stiff. Having untreated or uncontrolled hypertension can cause heart attack, stroke, kidney disease, and other problems. What are blood pressure readings? A blood pressure reading consists of a higher number over a lower number. Ideally, your blood pressure should be below 120/80. The first ("top") number is called the systolic pressure. It is a measure of the pressure in your arteries as your heart beats. The second ("bottom") number is called the diastolic pressure. It is a measure of the pressure in your arteries as the heart relaxes. What does my blood pressure reading mean? Blood pressure is classified into four stages. Based on your blood pressure reading, your health care provider may use the following stages to determine what type of treatment you need, if any. Systolic pressure and diastolic pressure are measured in a unit called mm Hg. Normal  Systolic pressure: below 120.  Diastolic pressure: below 80. Elevated  Systolic pressure: 120-129.  Diastolic pressure: below 80. Hypertension stage 1  Systolic pressure: 130-139.  Diastolic pressure: 80-89. Hypertension stage 2  Systolic pressure: 140 or above.  Diastolic pressure: 90 or above. What health risks are associated with hypertension? Managing your hypertension is an important responsibility. Uncontrolled hypertension can lead to:  A heart attack.  A stroke.  A weakened blood vessel (aneurysm).  Heart failure.  Kidney damage.  Eye damage.  Metabolic syndrome.  Memory and concentration problems.  What changes can I make to manage my  hypertension? Hypertension can be managed by making lifestyle changes and possibly by taking medicines. Your health care provider will help you make a plan to bring your blood pressure within a normal range. Eating and drinking  Eat a diet that is high in fiber and potassium, and low in salt (sodium), added sugar, and fat. An example eating plan is called the DASH (Dietary Approaches to Stop Hypertension) diet. To eat this way: ? Eat plenty of fresh fruits and vegetables. Try to fill half of your plate at each meal with fruits and vegetables. ? Eat whole grains, such as whole wheat pasta, brown rice, or whole grain bread. Fill about one quarter of your plate with whole grains. ? Eat low-fat diary products. ? Avoid fatty cuts of meat, processed or cured meats, and poultry with skin. Fill about one quarter of your plate with lean proteins such as fish, chicken without skin, beans, eggs, and tofu. ? Avoid premade and processed foods. These tend to be higher in sodium, added sugar, and fat.  Reduce your daily sodium intake. Most people with hypertension should eat less than 1,500 mg of sodium a day.  Limit alcohol intake to no more than 1 drink a day for nonpregnant women and 2 drinks a day for men. One drink equals 12 oz of beer, 5 oz of wine, or 1 oz of hard liquor. Lifestyle  Work with your health care provider to maintain a healthy body weight, or to lose weight. Ask what an ideal weight is for you.  Get at least 30 minutes of exercise that causes your heart to beat faster (aerobic exercise) most days of the week. Activities may include walking, swimming, or biking.  Include exercise   to strengthen your muscles (resistance exercise), such as weight lifting, as part of your weekly exercise routine. Try to do these types of exercises for 30 minutes at least 3 days a week.  Do not use any products that contain nicotine or tobacco, such as cigarettes and e-cigarettes. If you need help quitting, ask  your health care provider.  Control any long-term (chronic) conditions you have, such as high cholesterol or diabetes. Monitoring  Monitor your blood pressure at home as told by your health care provider. Your personal target blood pressure may vary depending on your medical conditions, your age, and other factors.  Have your blood pressure checked regularly, as often as told by your health care provider. Working with your health care provider  Review all the medicines you take with your health care provider because there may be side effects or interactions.  Talk with your health care provider about your diet, exercise habits, and other lifestyle factors that may be contributing to hypertension.  Visit your health care provider regularly. Your health care provider can help you create and adjust your plan for managing hypertension. Will I need medicine to control my blood pressure? Your health care provider may prescribe medicine if lifestyle changes are not enough to get your blood pressure under control, and if:  Your systolic blood pressure is 130 or higher.  Your diastolic blood pressure is 80 or higher.  Take medicines only as told by your health care provider. Follow the directions carefully. Blood pressure medicines must be taken as prescribed. The medicine does not work as well when you skip doses. Skipping doses also puts you at risk for problems. Contact a health care provider if:  You think you are having a reaction to medicines you have taken.  You have repeated (recurrent) headaches.  You feel dizzy.  You have swelling in your ankles.  You have trouble with your vision. Get help right away if:  You develop a severe headache or confusion.  You have unusual weakness or numbness, or you feel faint.  You have severe pain in your chest or abdomen.  You vomit repeatedly.  You have trouble breathing. Summary  Hypertension is when the force of blood pumping through  your arteries is too strong. If this condition is not controlled, it may put you at risk for serious complications.  Your personal target blood pressure may vary depending on your medical conditions, your age, and other factors. For most people, a normal blood pressure is less than 120/80.  Hypertension is managed by lifestyle changes, medicines, or both. Lifestyle changes include weight loss, eating a healthy, low-sodium diet, exercising more, and limiting alcohol. This information is not intended to replace advice given to you by your health care provider. Make sure you discuss any questions you have with your health care provider. Document Released: 01/31/2012 Document Revised: 04/05/2016 Document Reviewed: 04/05/2016 Elsevier Interactive Patient Education  2018 [place].  Hip Pain The hip is the joint between the upper legs and the lower pelvis. The bones, cartilage, tendons, and muscles of your hip joint support your body and allow you to move around. Hip pain can range from a minor ache to severe pain in one or both of your hips. The pain may be felt on the inside of the hip joint near the groin, or the outside near the buttocks and upper thigh. You may also have swelling or stiffness. Follow these instructions at home: Managing pain, stiffness, and swelling  If directed, apply  ice to the injured area. ? Put ice in a plastic bag. ? Place a towel between your skin and the bag. ? Leave the ice on for 20 minutes, 2-3 times a day  Sleep with a pillow between your legs on your most comfortable side.  Avoid any activities that cause pain. General instructions  Take over-the-counter and prescription medicines only as told by your health care provider.  Do any exercises as told by your health care provider.  Record the following: ? How often you have hip pain. ? The location of your pain. ? What the pain feels like. ? What makes the pain worse.  Keep all follow-up visits as told  by your health care provider. This is important. Contact a health care provider if:  You cannot put weight on your leg.  Your pain or swelling continues or gets worse after one week.  It gets harder to walk.  You have a fever. Get help right away if:  You fall.  You have a sudden increase in pain and swelling in your hip.  Your hip is red or swollen or very tender to touch. Summary  Hip pain can range from a minor ache to severe pain in one or both of your hips.  The pain may be felt on the inside of the hip joint near the groin, or the outside near the buttocks and upper thigh.  Avoid any activities that cause pain.  Record how often you have hip pain, the location of the pain, what makes it worse and what it feels like. This information is not intended to replace advice given to you by your health care provider. Make sure you discuss any questions you have with your health care provider. Document Released: 10/26/2009 Document Revised: 04/10/2016 Document Reviewed: 04/10/2016 Elsevier Interactive Patient Education  [employer].

## 2018-03-29 NOTE — Progress Notes (Signed)
Subjective:    Patient ID: [name] Small, female    DOB: Dec 07, 1928, 84 y.o.   MRN: [id number]  No chief complaint on file.   HPI Patient was seen today for follow-up on chronic conditions and TOC, previously seen by Dr. Burnice Logan.  HTN: -taking losartan 50 mg daily -Daughter-in-law cooking for patient.  HLD: -taking Lipitor 40 mg daily -denies myalgias  Afib: -taking Xarelto 15 mg, amiodarone 200 mg  -pacer in place -Xarelto expensive per pt, $50/ month -followed by Cards  H/o trochanteric fracture left femur and L hip pain -s/p ORIF several yrs ago.  Had a fall in Aug. -endorses pain in joint.  Wants rod removed -went to PT x 1 recently to see if they could help her walk better. -using cane to aid with ambulation -followed by [facility] -taking Tylenol Past Medical History:  Diagnosis Date  . Atrial fibrillation ([facility])   . Bleeding stomach ulcer 1980s?  [name] Cancer ([place]) Meloanoma left leg  . DIVERTICULOSIS, COLON 11/22/2006  . Headache(784.0)    "very often; not regular" (11/25/2015)  . History of blood transfusion 1980s   "when I had the bleeding ulcer"  . HYPERLIPIDEMIA 11/22/2006  . Hypertension   . Menopausal syndrome (hot flashes)   . OSTEOPOROSIS 11/22/2006  . Presence of permanent cardiac pacemaker     Allergies  Allergen Reactions  . Septra Ds [Sulfamethoxazole-Trimethoprim]   . Lisinopril Other (See Comments)    Cough    ROS General: Denies fever, chills, night sweats, changes in weight, changes in appetite HEENT: Denies headaches, ear pain, changes in vision, rhinorrhea, sore throat CV: Denies CP, palpitations, SOB, orthopnea Pulm: Denies SOB, cough, wheezing GI: Denies abdominal pain, nausea, vomiting, diarrhea, constipation GU: Denies dysuria, hematuria, frequency, vaginal discharge Msk: Denies muscle cramps, joint pains Neuro: Denies weakness, numbness, tingling Skin: Denies rashes, bruising Psych: Denies depression, anxiety, hallucinations     Objective:    Blood pressure 122/60, pulse 68, temperature (!) 97.5 F (36.4 C), weight 118 lb (53.5 kg), SpO2 98 %.   Gen. Pleasant, well-nourished, in no distress, normal affect  HEENT: [place]/AT, face symmetric, no scleral icterus, PERRLA, nares patent without drainage Lungs: no accessory muscle use, CTAB, no wheezes or rales Cardiovascular: RRR, no m/r/g, no peripheral edema Neuro:  A&Ox3, CN II-XII intact, ambulating with cane Skin:  Warm, no lesions/ rash  Wt Readings from Last 3 Encounters:  11/12/17 119 lb (54 kg)  10/19/17 119 lb 9.6 oz (54.3 kg)  08/01/17 120 lb (54.4 kg)    Lab Results  Component Value Date   WBC 6.3 08/01/2017   HGB 13.7 08/01/2017   HCT 41.9 08/01/2017   PLT 221.0 08/01/2017   GLUCOSE 90 08/01/2017   CHOL 163 08/01/2017   TRIG 67.0 08/01/2017   HDL 75.50 08/01/2017   LDLDIRECT 176.0 02/24/2008   LDLCALC 74 08/01/2017   ALT 15 08/01/2017   AST 17 08/01/2017   NA 140 08/01/2017   K 4.1 08/01/2017   CL 103 08/01/2017   CREATININE 1.11 08/01/2017   BUN 13 08/01/2017   CO2 28 08/01/2017   TSH 2.13 08/01/2017   INR 2.4 08/10/2014   HGBA1C 6.1 (H) 12/07/2014    Assessment/Plan:  Essential hypertension -controlled -Continue losartan 50 mg daily  Left hip pain -Continue Tylenol prn and Voltaren gel -Encouraged to f/u with Ortho  Hyperlipidemia, unspecified hyperlipidemia type -continue lipitor 40 mg daily  Afib  -continue Xarelto 15 mg daily -given med coupon  -f/u with Cardiology  F/u in 3 months, sooner if needed  Grier Mitts, MD

## 2018-04-10 ENCOUNTER — Other Ambulatory Visit: Payer: Self-pay

## 2018-04-10 ENCOUNTER — Telehealth: Payer: Self-pay | Admitting: Family Medicine

## 2018-04-10 ENCOUNTER — Other Ambulatory Visit: Payer: Self-pay | Admitting: Internal Medicine

## 2018-04-10 NOTE — Patient Outreach (Signed)
[facility]) Care Management  04/10/2018  NEVELYN MELLOTT [birth year] [id number]   Medication Adherence call to Mrs. Jorge Amparo spoke with patient she is due on Atorvastatin 40 mg she ask if we can call [facility] and order this medication for her, [facility] did not have refills on the prescription ,call doctor's office and left a message for doctor to call in a refill for Atorvastatin 40 mg to [facility] ,patient will pick once she gets  a call from [employer]. Mrs. Ator is showing past due under [place].   [facility] Management Direct Dial [phone]  Fax [phone] [email or web address]

## 2018-04-10 NOTE — Telephone Encounter (Signed)
Copied from [place] [id number]. Topic: Quick Communication - Rx Refill/Question >> Apr 10, 2018  4:11 PM Mcneil, Ja-Kwan wrote: Medication: atorvastatin (LIPITOR) 40 MG tablet  Has the patient contacted their pharmacy? no  Preferred Pharmacy (with phone number or street name): [facility] [place], [address]  [phone] (Phone)  [phone] (Fax)  Agent: Please be advised that RX refills may take up to 3 business days. We ask that you follow-up with your pharmacy.

## 2018-04-11 NOTE — Telephone Encounter (Signed)
Already refilled in another encounter

## 2018-04-22 ENCOUNTER — Ambulatory Visit (INDEPENDENT_AMBULATORY_CARE_PROVIDER_SITE_OTHER): Payer: Medicare Other | Admitting: Family Medicine

## 2018-04-22 ENCOUNTER — Encounter: Payer: Self-pay | Admitting: Family Medicine

## 2018-04-22 VITALS — BP 128/82 | HR 70 | Temp 97.6°F | Wt 117.4 lb

## 2018-04-22 DIAGNOSIS — N3000 Acute cystitis without hematuria: Secondary | ICD-10-CM

## 2018-04-22 DIAGNOSIS — R300 Dysuria: Secondary | ICD-10-CM | POA: Diagnosis not present

## 2018-04-22 LAB — POCT URINALYSIS DIPSTICK
Bilirubin, UA: POSITIVE
Blood, UA: NEGATIVE
GLUCOSE UA: NEGATIVE
Ketones, UA: POSITIVE
Nitrite, UA: POSITIVE
Protein, UA: POSITIVE — AB
SPEC GRAV UA: 1.02 (ref 1.010–1.025)
UROBILINOGEN UA: 1 U/dL
pH, UA: 5.5 (ref 5.0–8.0)

## 2018-04-22 MED ORDER — NITROFURANTOIN MONOHYD MACRO 100 MG PO CAPS: 100.0000 mg | ORAL_CAPSULE | Freq: Two times a day (BID) | ORAL | 0 refills | Status: DC

## 2018-04-22 NOTE — Addendum Note (Signed)
Addended by: Gwynne Edinger on: 04/22/2018 03:30 PM   Modules accepted: Orders

## 2018-04-22 NOTE — Progress Notes (Signed)
   Subjective:    Patient ID: [name] Small, female    DOB: 06-28-1928, 84 y.o.   MRN: [id number]  HPI Here for 3 days of urinary urgency and burning. No fever or nausea or back pain. Drinking plenty of water.    Review of Systems  Constitutional: Negative.   Respiratory: Negative.   Cardiovascular: Negative.   Genitourinary: Positive for dysuria, frequency and urgency. Negative for hematuria.       Objective:   Physical Exam  Constitutional: She appears well-developed and well-nourished.  Cardiovascular: Normal rate, regular rhythm, normal heart sounds and intact distal pulses.  Pulmonary/Chest: Effort normal and breath sounds normal.  Abdominal: Soft. Bowel sounds are normal. She exhibits no distension and no mass. There is no tenderness. There is no rebound and no guarding.          Assessment & Plan:  UTI, treat with Macrobid. Culture the sample.  Alysia Penna, MD

## 2018-04-24 LAB — URINE CULTURE
MICRO NUMBER:: 91439741
SPECIMEN QUALITY:: ADEQUATE

## 2018-05-13 LAB — CUP PACEART REMOTE DEVICE CHECK
Battery Remaining Longevity: 99 mo
Battery Voltage: 3.02 V
Brady Statistic AP VP Percent: 0.08 %
Brady Statistic AP VS Percent: 97.73 %
Brady Statistic AS VP Percent: 0.02 %
Brady Statistic AS VS Percent: 2.16 %
Brady Statistic RV Percent Paced: 0.1 %
Date Time Interrogation Session: 20191028155746
Implantable Lead Implant Date: 20170706
Implantable Lead Location: 753859
Implantable Lead Location: 753860
Implantable Lead Model: 5076
Implantable Lead Model: 5076
Lead Channel Impedance Value: 342 Ohm
Lead Channel Impedance Value: 418 Ohm
Lead Channel Impedance Value: 494 Ohm
Lead Channel Impedance Value: 646 Ohm
Lead Channel Pacing Threshold Amplitude: 0.75 V
Lead Channel Pacing Threshold Amplitude: 1.25 V
Lead Channel Pacing Threshold Pulse Width: 0.4 ms
Lead Channel Sensing Intrinsic Amplitude: 1.875 mV
Lead Channel Sensing Intrinsic Amplitude: 19.5 mV
Lead Channel Sensing Intrinsic Amplitude: 19.5 mV
Lead Channel Setting Pacing Amplitude: 1.5 V
Lead Channel Setting Pacing Amplitude: 2.5 V
Lead Channel Setting Pacing Pulse Width: 0.4 ms
Lead Channel Setting Sensing Sensitivity: 2.8 mV
MDC IDC LEAD IMPLANT DT: 20170706
MDC IDC MSMT LEADCHNL RA PACING THRESHOLD PULSEWIDTH: 0.4 ms
MDC IDC MSMT LEADCHNL RA SENSING INTR AMPL: 1.875 mV
MDC IDC PG IMPLANT DT: 20170706
MDC IDC STAT BRADY RA PERCENT PACED: 97.8 %

## 2018-06-17 ENCOUNTER — Ambulatory Visit (INDEPENDENT_AMBULATORY_CARE_PROVIDER_SITE_OTHER): Payer: Medicare Other

## 2018-06-17 DIAGNOSIS — I495 Sick sinus syndrome: Secondary | ICD-10-CM | POA: Diagnosis not present

## 2018-06-18 NOTE — Progress Notes (Signed)
Remote pacemaker transmission.   

## 2018-06-20 LAB — CUP PACEART REMOTE DEVICE CHECK
Battery Remaining Longevity: 98 mo
Battery Voltage: 3.02 V
Brady Statistic AP VP Percent: 0.07 %
Brady Statistic AS VP Percent: 0.01 %
Brady Statistic RA Percent Paced: 96.34 %
Brady Statistic RV Percent Paced: 0.07 %
Date Time Interrogation Session: 20200126171213
Implantable Lead Implant Date: 20170706
Implantable Lead Implant Date: 20170706
Implantable Lead Location: 753859
Implantable Lead Location: 753860
Implantable Lead Model: 5076
Implantable Lead Model: 5076
Implantable Pulse Generator Implant Date: 20170706
Lead Channel Impedance Value: 342 Ohm
Lead Channel Impedance Value: 437 Ohm
Lead Channel Impedance Value: 589 Ohm
Lead Channel Pacing Threshold Amplitude: 0.625 V
Lead Channel Pacing Threshold Amplitude: 1 V
Lead Channel Pacing Threshold Pulse Width: 0.4 ms
Lead Channel Sensing Intrinsic Amplitude: 2.25 mV
Lead Channel Sensing Intrinsic Amplitude: 2.25 mV
Lead Channel Sensing Intrinsic Amplitude: 23.75 mV
Lead Channel Sensing Intrinsic Amplitude: 23.75 mV
Lead Channel Setting Pacing Amplitude: 1.5 V
Lead Channel Setting Pacing Amplitude: 2.5 V
Lead Channel Setting Pacing Pulse Width: 0.4 ms
Lead Channel Setting Sensing Sensitivity: 2.8 mV
MDC IDC MSMT LEADCHNL RA IMPEDANCE VALUE: 456 Ohm
MDC IDC MSMT LEADCHNL RA PACING THRESHOLD PULSEWIDTH: 0.4 ms
MDC IDC STAT BRADY AP VS PERCENT: 96.29 %
MDC IDC STAT BRADY AS VS PERCENT: 3.63 %

## 2018-06-26 ENCOUNTER — Encounter: Payer: Self-pay | Admitting: Family Medicine

## 2018-06-26 ENCOUNTER — Ambulatory Visit (INDEPENDENT_AMBULATORY_CARE_PROVIDER_SITE_OTHER): Payer: Medicare Other | Admitting: Family Medicine

## 2018-06-26 VITALS — BP 122/64 | HR 64 | Temp 97.7°F | Wt 116.0 lb

## 2018-06-26 DIAGNOSIS — I10 Essential (primary) hypertension: Secondary | ICD-10-CM

## 2018-06-26 DIAGNOSIS — M25552 Pain in left hip: Secondary | ICD-10-CM | POA: Diagnosis not present

## 2018-06-26 DIAGNOSIS — I4891 Unspecified atrial fibrillation: Secondary | ICD-10-CM

## 2018-06-26 NOTE — Progress Notes (Signed)
Subjective:    Patient ID: [name] Small, female    DOB: 12-10-1928, 85 y.o.   MRN: [id number]  No chief complaint on file.   HPI Patient was seen today for follow-up on chronic conditions.    Left hip pain: -Seen by "someone that works with Dr. Marlou Sa" -states sent to PT which was not helpful. -endorses PT was expensive and did not help her pain -states no other options were given by Ortho. -Currently taking Tylenol for her hip pain -States was given Voltaren gel by Ortho which has not been helpful.  HTN: -Taking losartan 50 mg daily  A. fib: -On Xarelto 15 mg daily -states medication is expensive $45 per month -given coupons at last OFV but states she was unable to use them. -Has follow-up with cardiology coming up -denies dizziness, CP, palpitations  Pt somewhat down.  Mentions her sister was put in a SNF in [place], [place] in a "lock down unit" as she has Alzheimer's.  Pt states "it's just sad".  Past Medical History:  Diagnosis Date  . Atrial fibrillation ([place])   . Bleeding stomach ulcer 1980s?  [name] Cancer ([place]) Meloanoma left leg  . DIVERTICULOSIS, COLON 11/22/2006  . Headache(784.0)    "very often; not regular" (11/25/2015)  . History of blood transfusion 1980s   "when I had the bleeding ulcer"  . HYPERLIPIDEMIA 11/22/2006  . Hypertension   . Menopausal syndrome (hot flashes)   . OSTEOPOROSIS 11/22/2006  . Presence of permanent cardiac pacemaker     Allergies  Allergen Reactions  . Septra Ds [Sulfamethoxazole-Trimethoprim]   . Lisinopril Other (See Comments)    Cough    ROS General: Denies fever, chills, night sweats, changes in weight, changes in appetite HEENT: Denies headaches, ear pain, changes in vision, rhinorrhea, sore throat CV: Denies CP, palpitations, SOB, orthopnea Pulm: Denies SOB, cough, wheezing GI: Denies abdominal pain, nausea, vomiting, diarrhea, constipation GU: Denies dysuria, hematuria, frequency, vaginal discharge Msk: Denies muscle cramps   +L hip  pain Neuro: Denies weakness, numbness, tingling Skin: Denies rashes, bruising Psych: Denies depression, anxiety, hallucinations    Objective:    Blood pressure 122/64, pulse 64, temperature 97.7 F (36.5 C), temperature source Oral, weight 116 lb (52.6 kg), SpO2 95 %.  Gen. Pleasant, well-nourished, in no distress, normal affect   HEENT: [place]/AT, face symmetric, no scleral icterus, PERRLA, nares patent without drainage Lungs: no accessory muscle use, CTAB, no wheezes or rales Cardiovascular: RRR, no m/r/g, no peripheral edema MSK: TTP of left hip and thigh. Neuro:  A&Ox3, CN II-XII intact, ambulating with cane Skin:  Warm, no lesions/ rash  Wt Readings from Last 3 Encounters:  06/26/18 116 lb (52.6 kg)  04/22/18 117 lb 6 oz (53.2 kg)  03/29/18 118 lb (53.5 kg)    Lab Results  Component Value Date   WBC 6.3 08/01/2017   HGB 13.7 08/01/2017   HCT 41.9 08/01/2017   PLT 221.0 08/01/2017   GLUCOSE 90 08/01/2017   CHOL 163 08/01/2017   TRIG 67.0 08/01/2017   HDL 75.50 08/01/2017   LDLDIRECT 176.0 02/24/2008   LDLCALC 74 08/01/2017   ALT 15 08/01/2017   AST 17 08/01/2017   NA 140 08/01/2017   K 4.1 08/01/2017   CL 103 08/01/2017   CREATININE 1.11 08/01/2017   BUN 13 08/01/2017   CO2 28 08/01/2017   TSH 2.13 08/01/2017   INR 2.4 08/10/2014   HGBA1C 6.1 (H) 12/07/2014    Assessment/Plan:  Essential hypertension -Controlled -Continue losartan  50 mg daily  Atrial fibrillation, unspecified type (H[facility]) -Rate controlled, asymptomatic -Continue amiodarone 200 mg daily, Xarelto 15 mg daily -Continue follow-up with Dr. Johnsie Cancel. -Patient encouraged to mention cost of Xarelto at next [facility] with cardiology  Pain of left hip joint -Voltaren gel not helpful -Unsure if injections are an option for patient as recent x-ray results not available. -Discussed follow-up with orthopedics -Patient advised to continue Tylenol PRN -Also discussed using heat, massage -Attempts to contact  [facility] unsuccessful doing OFV.  Patient encouraged to call and schedule follow-up appointment.  F/u prn in 6 months, sooner if needed  Grier Mitts, MD

## 2018-08-19 ENCOUNTER — Ambulatory Visit: Payer: Medicare Other | Admitting: Cardiovascular Disease

## 2018-09-09 ENCOUNTER — Ambulatory Visit: Payer: Medicare Other | Admitting: Cardiovascular Disease

## 2018-09-16 ENCOUNTER — Other Ambulatory Visit: Payer: Self-pay

## 2018-09-16 ENCOUNTER — Encounter: Payer: Medicare Other | Admitting: *Deleted

## 2018-09-17 ENCOUNTER — Telehealth: Payer: Self-pay

## 2018-09-17 NOTE — Telephone Encounter (Signed)
Left message for patient to remind of missed remote transmission.  

## 2018-09-18 ENCOUNTER — Encounter: Payer: Self-pay | Admitting: Family Medicine

## 2018-09-18 ENCOUNTER — Other Ambulatory Visit: Payer: Self-pay

## 2018-09-18 ENCOUNTER — Ambulatory Visit (INDEPENDENT_AMBULATORY_CARE_PROVIDER_SITE_OTHER): Payer: Medicare Other | Admitting: Family Medicine

## 2018-09-18 DIAGNOSIS — H9313 Tinnitus, bilateral: Secondary | ICD-10-CM

## 2018-09-18 NOTE — Progress Notes (Signed)
Virtual Visit via Telephone Note  I connected with [name] Small on 09/18/18 at  2:30 PM EDT by telephone and verified that I am speaking with the correct person using two identifiers.   I discussed the limitations, risks, security and privacy concerns of performing an evaluation and management service by telephone and the availability of in person appointments. I also discussed with the patient that there may be a patient responsible charge related to this service. The patient expressed understanding and agreed to proceed.  Location patient: home Location provider: home office Participants present for the call: patient, provider, daughter-in-law [name], son [name], and other daughter-in law. Patient did not have a visit in the prior 7 days to address this/these issue(s).   History of Present Illness: Pt with ringing in her ears x 1 month.  Notices noise more at night.  Denies HAs, ear pain, ear pressure, recent viral illness, change in medications, or trouble hearing.  Pt's family states the pt was convineced her phone was making the noise, so they replaced it.  She now thinks the water heater is making the noise.  Pt has been using ear wax drops for 2 wks, but still notes the ringing.  Pt is drinking maybe 2 bottles of water per day.  BP has been controlled.    Observations/Objective: Patient sounds cheerful and well on the phone. I do not appreciate any SOB. Speech and thought processing are grossly intact. Patient reported vitals:  Assessment and Plan: Tinnitus of both ears  -reviewed possible causes.  Advised may resolve on its own or continue. -no redflag symptoms noted at this time -medications reviewed.  -will continue earwax drops at this time.  Also discussed increasing po hydration. - Plan: Ambulatory referral to ENT -questions answered to satisfaction.  Follow Up Instructions: F/u prn  I did not refer this patient for an OV in the next 24 hours for this/these  issue(s).  I discussed the assessment and treatment plan with the patient. The patient was provided an opportunity to ask questions and all were answered. The patient agreed with the plan and demonstrated an understanding of the instructions.   The patient was advised to call back or seek an in-person evaluation if the symptoms worsen or if the condition fails to improve as anticipated.  I provided 17 minutes of non-face-to-face time during this encounter.   Billie Ruddy, MD

## 2018-09-30 ENCOUNTER — Telehealth: Payer: Self-pay | Admitting: Cardiology

## 2018-09-30 NOTE — Telephone Encounter (Signed)
New message     Virtual video visit scheduled for 10-03-18.  Pt will have recent bp available for nurse.  Patient also gave consent for virtual video visit.  YOUR CARDIOLOGY TEAM HAS ARRANGED FOR AN E-VISIT FOR YOUR APPOINTMENT - PLEASE REVIEW IMPORTANT INFORMATION BELOW SEVERAL DAYS PRIOR TO YOUR APPOINTMENT  Due to the recent COVID-19 pandemic, we are transitioning in-person office visits to tele-medicine visits in an effort to decrease unnecessary exposure to our patients, their families, and staff. These visits are billed to your insurance just like a normal visit is. We also encourage you to sign up for MyChart if you have not already done so. You will need a smartphone if possible. For patients that do not have this, we can still complete the visit using a regular telephone but do prefer a smartphone to enable video when possible. You may have a family member that lives with you that can help. If possible, we also ask that you have a blood pressure cuff and scale at home to measure your blood pressure, heart rate and weight prior to your scheduled appointment. Patients with clinical needs that need an in-person evaluation and testing will still be able to come to the office if absolutely necessary. If you have any questions, feel free to call our office.     YOUR PROVIDER WILL BE USING THE FOLLOWING PLATFORM TO COMPLETE YOUR VISIT: Doximity   IF USING MYCHART - How to Download the MyChart App to Your SmartPhone   - If Apple, go to [employer] and type in MyChart in the search bar and download the app. If Android, ask patient to go to [employer] and type in [place] in the search bar and download the app. The app is free but as with any other app downloads, your phone may require you to verify saved payment information or Apple/Android password.  - You will need to then log into the app with your MyChart username and password, and select [facility] as your healthcare provider to link the  account.  - When it is time for your visit, go to the MyChart app, find appointments, and click Begin Video Visit. Be sure to Select Allow for your device to access the Microphone and Camera for your visit. You will then be connected, and your provider will be with you shortly.  **If you have any issues connecting or need assistance, please contact MyChart service desk [phone])**  **If using a computer, in order to ensure the best quality for your visit, you will need to use either of the following Internet Browsers: Insurance underwriter or Microsoft Edge**   IF USING DOXIMITY or [email or web address].ME - The staff will give you instructions on receiving your link to join the meeting the day of your visit.      2-3 DAYS BEFORE YOUR APPOINTMENT  You will receive a telephone call from one of our [facility] team members - your caller ID may say "Unknown caller." If this is a video visit, we will walk you through how to get the video launched on your phone. We will remind you check your blood pressure, heart rate and weight prior to your scheduled appointment. If you have an Apple Watch or Kardia, please upload any pertinent ECG strips the day before or morning of your appointment to [facility]. Our staff will also make sure you have reviewed the consent and agree to move forward with your scheduled tele-health visit.     THE DAY OF  YOUR APPOINTMENT  Approximately 15 minutes prior to your scheduled appointment, you will receive a telephone call from one of [facility] team - your caller ID may say "Unknown caller."  Our staff will confirm medications, vital signs for the day and any symptoms you may be experiencing. Please have this information available prior to the time of visit start. It may also be helpful for you to have a pad of paper and pen handy for any instructions given during your visit. They will also walk you through joining the smartphone meeting if this is a video visit.    CONSENT FOR  TELE-HEALTH VISIT - PLEASE REVIEW  I hereby voluntarily request, consent and authorize [facility] and its employed or contracted physicians, physician assistants, nurse practitioners or other licensed health care professionals (the Practitioner), to provide me with telemedicine health care services (the Services") as deemed necessary by the treating Practitioner. I acknowledge and consent to receive the Services by the Practitioner via telemedicine. I understand that the telemedicine visit will involve communicating with the Practitioner through live audiovisual communication technology and the disclosure of certain medical information by electronic transmission. I acknowledge that I have been given the opportunity to request an in-person assessment or other available alternative prior to the telemedicine visit and am voluntarily participating in the telemedicine visit.  I understand that I have the right to withhold or withdraw my consent to the use of telemedicine in the course of my care at any time, without affecting my right to future care or treatment, and that the Practitioner or I may terminate the telemedicine visit at any time. I understand that I have the right to inspect all information obtained and/or recorded in the course of the telemedicine visit and may receive copies of available information for a reasonable fee.  I understand that some of the potential risks of receiving the Services via telemedicine include:   Delay or interruption in medical evaluation due to technological equipment failure or disruption;  Information transmitted may not be sufficient (e.g. poor resolution of images) to allow for appropriate medical decision making by the Practitioner; and/or   In rare instances, security protocols could fail, causing a breach of personal health information.  Furthermore, I acknowledge that it is my responsibility to provide information about my medical history, conditions and  care that is complete and accurate to the best of my ability. I acknowledge that Practitioner's advice, recommendations, and/or decision may be based on factors not within their control, such as incomplete or inaccurate data provided by me or distortions of diagnostic images or specimens that may result from electronic transmissions. I understand that the practice of medicine is not an exact science and that Practitioner makes no warranties or guarantees regarding treatment outcomes. I acknowledge that I will receive a copy of this consent concurrently upon execution via email to the email address I last provided but may also request a printed copy by calling the office of [facility].    I understand that my insurance will be billed for this visit.   I have read or had this consent read to me.  I understand the contents of this consent, which adequately explains the benefits and risks of the Services being provided via telemedicine.   I have been provided ample opportunity to ask questions regarding this consent and the Services and have had my questions answered to my satisfaction.  I give my informed consent for the services to be provided through the use of telemedicine  in my medical care  By participating in this telemedicine visit I agree to the above.

## 2018-10-02 ENCOUNTER — Ambulatory Visit (INDEPENDENT_AMBULATORY_CARE_PROVIDER_SITE_OTHER): Payer: Medicare Other | Admitting: *Deleted

## 2018-10-02 DIAGNOSIS — I495 Sick sinus syndrome: Secondary | ICD-10-CM

## 2018-10-03 ENCOUNTER — Telehealth (INDEPENDENT_AMBULATORY_CARE_PROVIDER_SITE_OTHER): Payer: Medicare Other | Admitting: Cardiology

## 2018-10-03 ENCOUNTER — Other Ambulatory Visit: Payer: Self-pay

## 2018-10-03 ENCOUNTER — Encounter: Payer: Self-pay | Admitting: Cardiology

## 2018-10-03 VITALS — BP 110/71 | HR 60 | Ht 65.0 in | Wt 110.0 lb

## 2018-10-03 DIAGNOSIS — I4819 Other persistent atrial fibrillation: Secondary | ICD-10-CM | POA: Diagnosis not present

## 2018-10-03 DIAGNOSIS — I10 Essential (primary) hypertension: Secondary | ICD-10-CM

## 2018-10-03 DIAGNOSIS — I495 Sick sinus syndrome: Secondary | ICD-10-CM

## 2018-10-03 DIAGNOSIS — Z7901 Long term (current) use of anticoagulants: Secondary | ICD-10-CM

## 2018-10-03 DIAGNOSIS — E782 Mixed hyperlipidemia: Secondary | ICD-10-CM

## 2018-10-03 LAB — CUP PACEART REMOTE DEVICE CHECK
Battery Remaining Longevity: 90 mo
Battery Voltage: 3.02 V
Brady Statistic AP VP Percent: 0.06 %
Brady Statistic AP VS Percent: 96.77 %
Brady Statistic AS VP Percent: 0.01 %
Brady Statistic AS VS Percent: 3.17 %
Brady Statistic RA Percent Paced: 96.81 %
Brady Statistic RV Percent Paced: 0.06 %
Date Time Interrogation Session: 20200513184317
Implantable Lead Implant Date: 20170706
Implantable Lead Implant Date: 20170706
Implantable Lead Location: 753859
Implantable Lead Location: 753860
Implantable Lead Model: 5076
Implantable Lead Model: 5076
Implantable Pulse Generator Implant Date: 20170706
Lead Channel Impedance Value: 342 Ohm
Lead Channel Impedance Value: 418 Ohm
Lead Channel Impedance Value: 494 Ohm
Lead Channel Impedance Value: 646 Ohm
Lead Channel Pacing Threshold Amplitude: 0.625 V
Lead Channel Pacing Threshold Amplitude: 1 V
Lead Channel Pacing Threshold Pulse Width: 0.4 ms
Lead Channel Pacing Threshold Pulse Width: 0.4 ms
Lead Channel Sensing Intrinsic Amplitude: 1.875 mV
Lead Channel Sensing Intrinsic Amplitude: 1.875 mV
Lead Channel Sensing Intrinsic Amplitude: 19.5 mV
Lead Channel Sensing Intrinsic Amplitude: 19.5 mV
Lead Channel Setting Pacing Amplitude: 1.5 V
Lead Channel Setting Pacing Amplitude: 2.5 V
Lead Channel Setting Pacing Pulse Width: 0.4 ms
Lead Channel Setting Sensing Sensitivity: 2.8 mV

## 2018-10-03 NOTE — Patient Instructions (Signed)
Medication Instructions:  Your physician recommends that you continue on your current medications as directed. Please refer to the Current Medication list given to you today. If you need a refill on your cardiac medications before your next appointment, please call your pharmacy.   Lab work: NONE If you have labs (blood work) drawn today and your tests are completely normal, you will receive your results only by: [name] MyChart Message (if you have MyChart) OR . A paper copy in the mail If you have any lab test that is abnormal or we need to change your treatment, we will call you to review the results.  Testing/Procedures: NONE  Follow-Up: At [facility], you and your health needs are our priority.  As part of our continuing mission to provide you with exceptional heart care, we have created designated Provider Care Teams.  These Care Teams include your primary Cardiologist (physician) and Advanced Practice Providers (APPs -  Physician Assistants and Nurse Practitioners) who all work together to provide you with the care you need, when you need it. You will need a follow up appointment in 4 months.  Please call our office 2 months in advance to schedule this appointment.  You may see Will Meredith Leeds, MD or one of the following Advanced Practice Providers on your designated Care Team:   Chanetta Marshall, NP . Tommye Standard, PA-C   You will need a follow up appointment in 6 months.  Please call our office 2 months in advance to schedule this appointment.  You may see Jenkins Rouge, MD or one of the following Advanced Practice Providers on your designated Care Team:   Truitt Merle, NP Cecilie Kicks, NP . Kathyrn Drown, NP

## 2018-10-03 NOTE — Progress Notes (Signed)
Virtual Visit via Telephone Note   This visit type was conducted due to national recommendations for restrictions regarding the COVID-19 Pandemic (e.g. social distancing) in an effort to limit this patient's exposure and mitigate transmission in our community.  Due to her co-morbid illnesses, this patient is at least at moderate risk for complications without adequate follow up.  This format is felt to be most appropriate for this patient at this time.  The patient did not have access to video technology/had technical difficulties with video requiring transitioning to audio format only (telephone).  All issues noted in this document were discussed and addressed.  No physical exam could be performed with this format.  Please refer to the patient's chart for her  consent to telehealth for [facility].   Date:  10/03/2018   ID:  [name] Small, DOB [birth year], MRN [id number]  Patient Location: Home Provider Location: Office  PCP:  Billie Ruddy, MD  Cardiologist:  Jenkins Rouge, MD  Electrophysiologist:  Constance Haw, MD   Evaluation Performed:  Follow-Up Visit  Chief Complaint:  Atrial fib   History of Present Illness:    [name] Small is a 85 y.o. female with SSS with pacer PAF, HTN, HLD and DM-2 Eliquis caused oral blisters and bleeding changed to Xarelto 15 mg daily. Pacer implanted Dr Curt Bears July 2017 MRI compatible. On amiodarone as well.   Device checked 10/02/18 and was normal, 5 beats of NSVT.   No a fib.    Today she is doing well staying in.  Her sons buy her groceries.  She may sit outside if warm.  No cough or fevers.  No chest pain or SOB.  She has no bleeding on the xarelto.  She does try and exercise daily with leg lifts from her chair.  No lightheadedness or dizziness.     The patient does not have symptoms concerning for COVID-19 infection (fever, chills, cough, or new shortness of breath).    Past Medical History:  Diagnosis Date  . Atrial fibrillation ([place])    . Bleeding stomach ulcer 1980s?  [name] Cancer ([place]) Meloanoma left leg  . DIVERTICULOSIS, COLON 11/22/2006  . Headache(784.0)    "very often; not regular" (11/25/2015)  . History of blood transfusion 1980s   "when I had the bleeding ulcer"  . HYPERLIPIDEMIA 11/22/2006  . Hypertension   . Menopausal syndrome (hot flashes)   . OSTEOPOROSIS 11/22/2006  . Presence of permanent cardiac pacemaker    Past Surgical History:  Procedure Laterality Date  . APPENDECTOMY    . BUNIONECTOMY Right   . CATARACT EXTRACTION W/ INTRAOCULAR LENS  IMPLANT, BILATERAL Bilateral 2000s  . CHOLECYSTECTOMY OPEN    . EP IMPLANTABLE DEVICE N/A 11/25/2015   Procedure: Pacemaker Implant;  Surgeon: Will Meredith Leeds, MD;  Location: [facility];  Service: Cardiovascular;  Laterality: N/A;  . FEMUR IM NAIL Left 07/08/2014   Procedure: INTRAMEDULLARY (IM) NAIL ;  Surgeon: Meredith Pel, MD;  Location: [facility];  Service: Orthopedics;  Laterality: Left;  . FRACTURE SURGERY    . REVISION TOTAL HIP ARTHROPLASTY Left 06/2014     Current Meds  Medication Sig  . acetaminophen (TYLENOL) 500 MG tablet Take 500 mg by mouth every 6 (six) hours as needed for mild pain or headache.   [name] amiodarone (PACERONE) 200 MG tablet TAKE 1 TABLET(200 MG) BY MOUTH DAILY  . atorvastatin (LIPITOR) 40 MG tablet TAKE 1 TABLET(40 MG) BY MOUTH DAILY  . Calcium  Carbonate-Vitamin D (CALTRATE 600+D PO) Take 1 tablet by mouth daily.    . diclofenac sodium (VOLTAREN) 1 % GEL Apply 4 g topically 4 (four) times daily as needed.  [name] losartan (COZAAR) 50 MG tablet Take 1 tablet (50 mg total) by mouth daily.  . Polyvinyl Alcohol-Povidone (REFRESH OP) Place 1 drop into both eyes 2 (two) times daily.  Alveda Reasons 15 MG TABS tablet TAKE 1 TABLET(15 MG) BY MOUTH DAILY WITH DINNER     Allergies:   Septra ds [sulfamethoxazole-trimethoprim] and Lisinopril   Social History   Tobacco Use  . Smoking status: Never Smoker  . Smokeless tobacco: Never Used   Substance Use Topics  . Alcohol use: No    Alcohol/week: 0.0 standard drinks  . Drug use: No     Family Hx: The patient's family history includes Alzheimer's disease in her sister; Cardiomyopathy in her brother.  ROS:   Please see the history of present illness.    General:no colds or fevers, no weight changes Skin:no rashes or ulcers HEENT:no blurred vision, no congestion CV:see HPI PUL:see HPI GI:no diarrhea constipation or melena, no indigestion GU:no hematuria, no dysuria MS:no joint pain, no claudication Neuro:no syncope, no lightheadedness Endo:no diabetes, no thyroid disease  All other systems reviewed and are negative.   Prior CV studies:   The following studies were reviewed today:  Echo 11/2014 Study Conclusions  - Left ventricle: The cavity size was normal. Wall thickness was   normal. Systolic function was normal. The estimated ejection   fraction was in the range of 55% to 60%. Wall motion was normal;   there were no regional wall motion abnormalities. Doppler   parameters are consistent with high ventricular filling pressure. - Aortic valve: There was trivial regurgitation. - Left atrium: The atrium was mildly dilated.  Impressions:  - Normal LV function; elevated LV filling pressure; mild LAE; trace   AI, MR and TR.   Labs/Other Tests and Data Reviewed:    EKG:  An ECG dated 07/31/17 was personally reviewed today and demonstrated:  a. paced and V sensing   Recent Labs: No results found for requested labs within last 8760 hours.   Recent Lipid Panel Lab Results  Component Value Date/Time   CHOL 163 08/01/2017 10:47 AM   TRIG 67.0 08/01/2017 10:47 AM   HDL 75.50 08/01/2017 10:47 AM   CHOLHDL 2 08/01/2017 10:47 AM   LDLCALC 74 08/01/2017 10:47 AM   LDLDIRECT 176.0 02/24/2008 02:24 PM    Wt Readings from Last 3 Encounters:  10/03/18 110 lb (49.9 kg)  06/26/18 116 lb (52.6 kg)  04/22/18 117 lb 6 oz (53.2 kg)     Objective:    Vital  Signs:  BP 110/71   Pulse 60   Ht 5\' 5"  (1.651 m)   Wt 110 lb (49.9 kg)   BMI 18.30 kg/m    VITAL SIGNS:  reviewed  General female with NAD on phone Neuro A&O X 3 MAE follows commands Lungs no SOB with talking, does complete sentences without having to stop. Psych: pleasant affect   ASSESSMENT & PLAN:    1. PAF and maintaining SR on amiodarone.  Labs normal 07/2017 will recheck on next visit. 2. Anticoagulation on xarelto, denies and bleeding. 3. HTN controlled continue current meds 4. SSS has MDT MRI compatible device.  Follow up with Dr. Curt Bears in 4 months. 5. HLD per PCP.   COVID-19 Education: The signs and symptoms of COVID-19 were discussed with the patient and  how to seek care for testing (follow up with PCP or arrange E-visit).  The importance of social distancing was discussed today.  Time:   Today, I have spent 10 minutes with the patient with telehealth technology discussing the above problems.     Medication Adjustments/Labs and Tests Ordered: Current medicines are reviewed at length with the patient today.  Concerns regarding medicines are outlined above.   Tests Ordered: No orders of the defined types were placed in this encounter.   Medication Changes: No orders of the defined types were placed in this encounter.   Disposition:  Follow up in 6 month(s)  Signed, Cecilie Kicks, NP  10/03/2018 3:26 PM    [facility]

## 2018-10-22 NOTE — Progress Notes (Signed)
Remote pacemaker transmission.   

## 2018-10-28 ENCOUNTER — Telehealth: Payer: Self-pay

## 2018-10-28 NOTE — Telephone Encounter (Signed)
Please advise if ok for referral

## 2018-10-28 NOTE — Telephone Encounter (Signed)
Patient called back wanting to know if she needs a referral to see ENT. She would like to see Dr Redmond Baseman. Patient would like a call back

## 2018-10-28 NOTE — Telephone Encounter (Signed)
Copied from [place] [phone]. Topic: Referral - Request for Referral >> Oct 28, 2018 11:50 AM Richardo Priest, NT wrote: Has patient seen PCP for this complaint? No only telemed visit, and patient states she is unsure *If NO, is insurance requiring patient see PCP for this issue before PCP can refer them? Referral for which specialty: ENT MD Preferred provider/office: any would be fine Reason for referral: Patient states her ears have been ringing for about a month or so.

## 2018-10-29 NOTE — Telephone Encounter (Signed)
A referral for ENT was placed in April 2020.

## 2018-10-31 NOTE — Telephone Encounter (Signed)
Spoke to our referral coordinator and confirmed that [name] Small has an appointment with Dr Redmond Baseman on 11/06/2018 at 10 am. [name] Small is aware

## 2018-11-05 ENCOUNTER — Encounter: Payer: Self-pay | Admitting: Family Medicine

## 2018-11-05 ENCOUNTER — Ambulatory Visit (INDEPENDENT_AMBULATORY_CARE_PROVIDER_SITE_OTHER): Payer: Medicare Other | Admitting: Family Medicine

## 2018-11-05 ENCOUNTER — Other Ambulatory Visit: Payer: Self-pay

## 2018-11-05 DIAGNOSIS — M542 Cervicalgia: Secondary | ICD-10-CM | POA: Diagnosis not present

## 2018-11-05 NOTE — Progress Notes (Signed)
Virtual Visit via Telephone Note  I connected with [name] Small on 11/05/18 at  2:30 PM EDT by telephone and verified that I am speaking with the correct person using two identifiers.   I discussed the limitations, risks, security and privacy concerns of performing an evaluation and management service by telephone and the availability of in person appointments. I also discussed with the patient that there may be a patient responsible charge related to this service. The patient expressed understanding and agreed to proceed.  Location patient: home Location provider: work or home office Participants present for the call: patient, provider Patient did not have a visit in the prior 7 days to address this/these issue(s).   History of Present Illness: Pt is an 85 yo female with pmh sig for HTN, Afib on eliquis, pacemaker in place, GIB 2/2 stomach ulcer, osteoporosis, L hip fx, diverticulosis, HLD.  Pt with HA x several day.  Tried tylenol 500 mg at the recommendation of her Cardiologist and sleeping on a  Heating pad.  When asked where the pain is, pt states hurting on the R side of neck.  Pt denies fever, sinus pressure or pain, ear pressure or pain, rhinorrhea, sore throat.  Pt sleeping on "an old flat pillow" but does not feel like it is contributing to her pain.  Drinking water and a coca cola.  Pain occurs off and on.  Happened twice today.   BP has been 120/70s.   Observations/Objective: Patient sounds cheerful and well on the phone. I do not appreciate any SOB. Speech and thought processing are grossly intact. Patient reported vitals:  Assessment and Plan: Neck pain on right side  -Pt describes neck pain more than HA. -discussed taking Tylenol 1000 mg TID prn.  Pt unable to take ibuprofen 2/2 h/o bleed from stomach ulcer. -Ok to use heat intermittently -can also use Aspercreme to rub on neck. -f/u prn   Follow Up Instructions: F/u prn  I did not refer this patient for an OV in the  next 24 hours for this/these issue(s).  I discussed the assessment and treatment plan with the patient. The patient was provided an opportunity to ask questions and all were answered. The patient agreed with the plan and demonstrated an understanding of the instructions.   The patient was advised to call back or seek an in-person evaluation if the symptoms worsen or if the condition fails to improve as anticipated.  I provided 8 minutes of non-face-to-face time during this encounter.   Billie Ruddy, MD

## 2018-11-14 ENCOUNTER — Other Ambulatory Visit: Payer: Self-pay | Admitting: Cardiovascular Disease

## 2018-11-14 DIAGNOSIS — I4819 Other persistent atrial fibrillation: Secondary | ICD-10-CM

## 2018-11-14 DIAGNOSIS — Z7901 Long term (current) use of anticoagulants: Secondary | ICD-10-CM

## 2018-11-14 NOTE — Telephone Encounter (Addendum)
Prescription refill request for Xarelto received.  Last office visit: Cecilie Kicks 10-03-2018 Weight: 52.6 kg  (06-26-2018) Age: 85 y.o.  Scr: 1.11 (08-01-2017) CrCl: 30 ml/min Pt is overdue for a CBC and Bmet. Called pt to schedule an appointment to get labs. Pt's lab work appointment scheduled for 11/18/2018. Pt stated she has enough Xarelto to get her to lab appointment. Will refill medication after her appointment.

## 2018-11-15 ENCOUNTER — Other Ambulatory Visit: Payer: Self-pay

## 2018-11-15 MED ORDER — ATORVASTATIN CALCIUM 40 MG PO TABS: ORAL_TABLET | ORAL | 0 refills | Status: DC

## 2018-11-15 NOTE — Telephone Encounter (Signed)
Copied from [place] [phone]. Topic: General - Other >> Nov 15, 2018  9:49 AM [name] Small wrote: Reason for CRM: Patient requesting call back from CMA to discuss refilling "a couple" medications which she is unsure of the names. Please advise?

## 2018-11-18 ENCOUNTER — Other Ambulatory Visit: Payer: Self-pay

## 2018-11-18 ENCOUNTER — Other Ambulatory Visit: Payer: Medicare Other

## 2018-11-18 DIAGNOSIS — Z7901 Long term (current) use of anticoagulants: Secondary | ICD-10-CM

## 2018-11-18 DIAGNOSIS — I4819 Other persistent atrial fibrillation: Secondary | ICD-10-CM

## 2018-11-18 LAB — BASIC METABOLIC PANEL
BUN/Creatinine Ratio: 13 (ref 12–28)
BUN: 17 mg/dL (ref 8–27)
CO2: 24 mmol/L (ref 20–29)
Calcium: 9.8 mg/dL (ref 8.7–10.3)
Chloride: 102 mmol/L (ref 96–106)
Creatinine, Ser: 1.29 mg/dL — ABNORMAL HIGH (ref 0.57–1.00)
GFR calc Af Amer: 43 mL/min/{1.73_m2} — ABNORMAL LOW (ref >59)
GFR calc non Af Amer: 37 mL/min/{1.73_m2} — ABNORMAL LOW (ref >59)
Glucose: 92 mg/dL (ref 65–99)
Potassium: 4.7 mmol/L (ref 3.5–5.2)
Sodium: 139 mmol/L (ref 134–144)

## 2018-11-18 LAB — CBC
Hematocrit: 41.9 % (ref 34.0–46.6)
Hemoglobin: 13.8 g/dL (ref 11.1–15.9)
MCH: 30.3 pg (ref 26.6–33.0)
MCHC: 32.9 g/dL (ref 31.5–35.7)
MCV: 92 fL (ref 79–97)
Platelets: 213 10*3/uL (ref 150–450)
RBC: 4.55 x10E6/uL (ref 3.77–5.28)
RDW: 12.9 % (ref 11.7–15.4)
WBC: 6.1 10*3/uL (ref 3.4–10.8)

## 2018-11-18 NOTE — Telephone Encounter (Signed)
Pt is out of Xarelto. Today's labs have not resulted. Will send in a 30 day supply.

## 2018-11-18 NOTE — Telephone Encounter (Signed)
Patient came in today for blood work- she took her last Xarelto yesterday needs them called in today

## 2018-11-19 ENCOUNTER — Other Ambulatory Visit: Payer: Self-pay | Admitting: *Deleted

## 2018-11-19 MED ORDER — RIVAROXABAN 15 MG PO TABS: ORAL_TABLET | ORAL | 5 refills | Status: DC

## 2018-11-19 NOTE — Telephone Encounter (Signed)
Prescription refill request for Xarelto received.   Last office visit: [name] Small (10-03-2018) Weight: 52.6 kg (06-26-2018) Age: 85 y.o. Scr: 1.29 (11-18-2018) CrCl: 26 ml/min  Prescription refill sent.

## 2018-12-31 ENCOUNTER — Telehealth: Payer: Self-pay | Admitting: Cardiology

## 2018-12-31 NOTE — Telephone Encounter (Signed)
Dont know that this is related to her heart or meds f/u primary

## 2018-12-31 NOTE — Telephone Encounter (Signed)
Spoke with pt and went over recommendations.  Pt agreeable with plan.

## 2018-12-31 NOTE — Telephone Encounter (Signed)
Pt states for about a month now she has been waking up feeling weak and it takes her about 30 mins to get out of bed.  She has to have her coffee and juice before she can get up.  Started checking her BP in the morning and it is usually low 100s/40-50.  This morning was 104/55, HR 64.  Denies lightheadedness or dizziness.  This morning after coffee, juice and getting out of bed, her BP was 153/76. Takes Amiodarone 200mg  and Losartan 50mg  everyday at 3pm.  Advised I will send to Dr. Johnsie Cancel for review.

## 2018-12-31 NOTE — Telephone Encounter (Signed)
Pt c/o BP issue: STAT if pt c/o blurred vision, one-sided weakness or slurred speech  1. What are your last 5 BP readings? usually in the morning she states is 100/40  2. Are you having any other symptoms (ex. Dizziness, headache, blurred vision, passed out)? Not able to sit up, because it's down so low.   3. What is your BP issue? Want to know if Dr. Curt Bears can give her something or tell her what to do since it down so low.

## 2019-01-02 ENCOUNTER — Ambulatory Visit (INDEPENDENT_AMBULATORY_CARE_PROVIDER_SITE_OTHER): Payer: Medicare Other | Admitting: *Deleted

## 2019-01-02 DIAGNOSIS — I495 Sick sinus syndrome: Secondary | ICD-10-CM | POA: Diagnosis not present

## 2019-01-03 LAB — CUP PACEART REMOTE DEVICE CHECK
Battery Remaining Longevity: 86 mo
Battery Voltage: 3.01 V
Brady Statistic AP VP Percent: 0.08 %
Brady Statistic AP VS Percent: 96.52 %
Brady Statistic AS VP Percent: 0.01 %
Brady Statistic AS VS Percent: 3.39 %
Brady Statistic RA Percent Paced: 96.57 %
Brady Statistic RV Percent Paced: 0.09 %
Date Time Interrogation Session: 20200814113454
Implantable Lead Implant Date: 20170706
Implantable Lead Implant Date: 20170706
Implantable Lead Location: 753859
Implantable Lead Location: 753860
Implantable Lead Model: 5076
Implantable Lead Model: 5076
Implantable Pulse Generator Implant Date: 20170706
Lead Channel Impedance Value: 342 Ohm
Lead Channel Impedance Value: 418 Ohm
Lead Channel Impedance Value: 494 Ohm
Lead Channel Impedance Value: 646 Ohm
Lead Channel Pacing Threshold Amplitude: 0.75 V
Lead Channel Pacing Threshold Amplitude: 1 V
Lead Channel Pacing Threshold Pulse Width: 0.4 ms
Lead Channel Pacing Threshold Pulse Width: 0.4 ms
Lead Channel Sensing Intrinsic Amplitude: 2.375 mV
Lead Channel Sensing Intrinsic Amplitude: 2.375 mV
Lead Channel Sensing Intrinsic Amplitude: 20.125 mV
Lead Channel Sensing Intrinsic Amplitude: 20.125 mV
Lead Channel Setting Pacing Amplitude: 1.5 V
Lead Channel Setting Pacing Amplitude: 2.5 V
Lead Channel Setting Pacing Pulse Width: 0.4 ms
Lead Channel Setting Sensing Sensitivity: 2.8 mV

## 2019-01-06 ENCOUNTER — Telehealth: Payer: Self-pay | Admitting: *Deleted

## 2019-01-06 MED ORDER — METOPROLOL TARTRATE 25 MG PO TABS: 25.0000 mg | ORAL_TABLET | Freq: Two times a day (BID) | ORAL | 11 refills | Status: DC

## 2019-01-06 NOTE — Telephone Encounter (Signed)
Pt has been notified of device results and recommendations by phone with verbal understanding. Pt is agreeable to start metoprolol tart 25 mg BID, Rx has been sent in [place]. Pt thanked me for the call and my help. Patient notified of result.  Please refer to phone note from today for complete details.   Julaine Hua, CMA 01/06/2019 4:18 PM

## 2019-01-06 NOTE — Telephone Encounter (Signed)
-----   Message from Patsey Berthold, NP sent at 01/04/2019 11:52 AM EDT -----  ----- Message ----- From: Constance Haw, MD Sent: 01/03/2019   4:59 PM EDT To: [facility]  Abnormal device interrogation reviewed.  Lead parameters and battery status stable.  NSVT noted. Start metoprolol 25 mg BID

## 2019-01-13 NOTE — Progress Notes (Signed)
Remote pacemaker transmission.   

## 2019-03-05 ENCOUNTER — Other Ambulatory Visit: Payer: Self-pay

## 2019-03-05 ENCOUNTER — Ambulatory Visit (INDEPENDENT_AMBULATORY_CARE_PROVIDER_SITE_OTHER): Payer: Medicare Other

## 2019-03-05 DIAGNOSIS — Z23 Encounter for immunization: Secondary | ICD-10-CM | POA: Diagnosis not present

## 2019-03-25 ENCOUNTER — Ambulatory Visit: Payer: Medicare Other | Admitting: Cardiology

## 2019-03-25 ENCOUNTER — Other Ambulatory Visit: Payer: Self-pay

## 2019-03-25 ENCOUNTER — Encounter: Payer: Self-pay | Admitting: Cardiology

## 2019-03-25 VITALS — BP 146/84 | HR 63 | Ht 65.0 in | Wt 104.6 lb

## 2019-03-25 DIAGNOSIS — I495 Sick sinus syndrome: Secondary | ICD-10-CM | POA: Diagnosis not present

## 2019-03-25 DIAGNOSIS — Z79899 Other long term (current) drug therapy: Secondary | ICD-10-CM | POA: Diagnosis not present

## 2019-03-25 LAB — HEPATIC FUNCTION PANEL
ALT: 14 IU/L (ref 0–32)
AST: 18 IU/L (ref 0–40)
Albumin: 4.8 g/dL — ABNORMAL HIGH (ref 3.6–4.6)
Alkaline Phosphatase: 83 IU/L (ref 39–117)
Bilirubin Total: 0.7 mg/dL (ref 0.0–1.2)
Bilirubin, Direct: 0.23 mg/dL (ref 0.00–0.40)
Total Protein: 7.4 g/dL (ref 6.0–8.5)

## 2019-03-25 LAB — TSH: TSH: 3.56 u[IU]/mL (ref 0.450–4.500)

## 2019-03-25 NOTE — Patient Instructions (Signed)
Medication Instructions:  Your physician recommends that you continue on your current medications as directed. Please refer to the Current Medication list given to you today.  *If you need a refill on your cardiac medications before your next appointment, please call your pharmacy*  Labwork: None ordered If you have labs (blood work) drawn today and your tests are completely normal, you will receive your results only by:  [place] (if you have MyChart) OR  A paper copy in the mail If you have any lab test that is abnormal or we need to change your treatment, we will call you to review the results.  Testing/Procedures: None ordered  Follow-Up: Remote monitoring is used to monitor your Pacemaker or ICD from home. This monitoring reduces the number of office visits required to check your device to one time per year. It allows [place] to keep an eye on the functioning of your device to ensure it is working properly. You are scheduled for a device check from home on 04/03/19. You may send your transmission at any time that day. If you have a wireless device, the transmission will be sent automatically. After your physician reviews your transmission, you will receive a postcard with your next transmission date.  At [facility], you and your health needs are our priority.  As part of our continuing mission to provide you with exceptional heart care, we have created designated Provider Care Teams.  These Care Teams include your primary Cardiologist (physician) and Advanced Practice Providers (APPs -  Physician Assistants and Nurse Practitioners) who all work together to provide you with the care you need, when you need it.  You will need a follow up appointment in 6 months.  Please call our office 2 months in advance to schedule this appointment.  You may see Dr Curt Bears or one of the following Advanced Practice Providers on your designated Care Team:    Chanetta Marshall, NP  Tommye Standard, PA-C   Oda Kilts, [place]  Thank you for choosing [facility]!!   Trinidad Curet, RN [phone]

## 2019-03-25 NOTE — Progress Notes (Signed)
Electrophysiology Office Note   Date:  03/25/2019   ID:  [name] Small, DOB March 06, 1929, MRN [id number]  PCP:  Billie Ruddy, MD  Primary Electrophysiologist: Constance Haw, MD    No chief complaint on file.    History of Present Illness: [name] Small is a 85 y.o. female who presents today for electrophysiology evaluation.   She is a 85 y.o. female with h/o PAF that was first diagnosed 7/18 with PAF. Has has a past history of a fall in 2/16 with subsequent hip fracture and had been on coumadin for a short period of time. She does have some issues with bradycardia when is SR. She is bradycardic at night when sleeping.  She was d/c on low dose carvedilol. She then had a ER visit 9/7 for another episode of afib with rvr at 160 bpm which left her feeling weak and sweaty. Pacemaker placed 11/25/15.   Today, denies symptoms of palpitations, chest pain, shortness of breath, orthopnea, PND, lower extremity edema, claudication, dizziness, presyncope, syncope, bleeding, or neurologic sequela. The patient is tolerating medications without difficulties.  Overall she is doing well.  She has no chest pain or shortness of breath.  She is not moving nearly as much as she has been having some hip troubles.  She has follow-up for this.  She is also had a few falls, though no injuries.   Past Medical History:  Diagnosis Date  . Atrial fibrillation ([place])   . Bleeding stomach ulcer 1980s?  [name] Cancer ([place]) Meloanoma left leg  . DIVERTICULOSIS, COLON 11/22/2006  . Headache(784.0)    "very often; not regular" (11/25/2015)  . History of blood transfusion 1980s   "when I had the bleeding ulcer"  . HYPERLIPIDEMIA 11/22/2006  . Hypertension   . Menopausal syndrome (hot flashes)   . OSTEOPOROSIS 11/22/2006  . Presence of permanent cardiac pacemaker    Past Surgical History:  Procedure Laterality Date  . APPENDECTOMY    . BUNIONECTOMY Right   . CATARACT EXTRACTION W/ INTRAOCULAR LENS  IMPLANT, BILATERAL  Bilateral 2000s  . CHOLECYSTECTOMY OPEN    . EP IMPLANTABLE DEVICE N/A 11/25/2015   Procedure: Pacemaker Implant;  Surgeon: Roni Scow Meredith Leeds, MD;  Location: [facility];  Service: Cardiovascular;  Laterality: N/A;  . FEMUR IM NAIL Left 07/08/2014   Procedure: INTRAMEDULLARY (IM) NAIL ;  Surgeon: Meredith Pel, MD;  Location: [facility];  Service: Orthopedics;  Laterality: Left;  . FRACTURE SURGERY    . REVISION TOTAL HIP ARTHROPLASTY Left 06/2014     Current Outpatient Medications  Medication Sig Dispense Refill  . acetaminophen (TYLENOL) 500 MG tablet Take 500 mg by mouth every 6 (six) hours as needed for mild pain or headache.     [name] amiodarone (PACERONE) 200 MG tablet TAKE 1 TABLET(200 MG) BY MOUTH DAILY 90 tablet 3  . atorvastatin (LIPITOR) 40 MG tablet TAKE 1 TABLET(40 MG) BY MOUTH DAILY 90 tablet 0  . Calcium Carbonate-Vitamin D (CALTRATE 600+D PO) Take 1 tablet by mouth daily.      . diclofenac sodium (VOLTAREN) 1 % GEL Apply 4 g topically 4 (four) times daily as needed. 500 g 6  . losartan (COZAAR) 50 MG tablet Take 1 tablet (50 mg total) by mouth daily. 90 tablet 3  . metoprolol tartrate (LOPRESSOR) 25 MG tablet Take 1 tablet (25 mg total) by mouth 2 (two) times daily. 60 tablet 11  . Polyvinyl Alcohol-Povidone (REFRESH OP) Place 1 drop into both  eyes 2 (two) times daily.    . Rivaroxaban (XARELTO) 15 MG TABS tablet TAKE 1 TABLET(15 MG) BY MOUTH DAILY WITH DINNER 30 tablet 5   No current facility-administered medications for this visit.     Allergies:   Septra ds [sulfamethoxazole-trimethoprim] and Lisinopril   Social History:  The patient  reports that she has never smoked. She has never used smokeless tobacco. She reports that she does not drink alcohol or use drugs.   Family History:  The patient's family history includes Alzheimer's disease in her sister; Cardiomyopathy in her brother.   ROS:  Please see the history of present illness.   Otherwise, review of  systems is positive for none.   All other systems are reviewed and negative.   PHYSICAL EXAM: VS:  BP (!) 146/84   Pulse 63   Ht 5\' 5"  (1.651 m)   Wt 104 lb 9.6 oz (47.4 kg)   SpO2 96%   BMI 17.41 kg/m  , BMI Body mass index is 17.41 kg/m. GEN: Well nourished, well developed, in no acute distress  HEENT: normal  Neck: no JVD, carotid bruits, or masses Cardiac: RRR; no murmurs, rubs, or gallops,no edema  Respiratory:  clear to auscultation bilaterally, normal work of breathing GI: soft, nontender, nondistended, + BS MS: no deformity or atrophy  Skin: warm and dry, device site well healed Neuro:  Strength and sensation are intact Psych: euthymic mood, full affect  EKG:  EKG is ordered today. Personal review of the ekg ordered shows atrial paced, rate 63, nonspecific T wave changes  Personal review of the device interrogation today. Results in [place]: 11/18/2018: BUN 17; Creatinine, Ser 1.29; Hemoglobin 13.8; Platelets 213; Potassium 4.7; Sodium 139    Lipid Panel     Component Value Date/Time   CHOL 163 08/01/2017 1047   TRIG 67.0 08/01/2017 1047   HDL 75.50 08/01/2017 1047   CHOLHDL 2 08/01/2017 1047   VLDL 13.4 08/01/2017 1047   LDLCALC 74 08/01/2017 1047   LDLDIRECT 176.0 02/24/2008 1424     Wt Readings from Last 3 Encounters:  03/25/19 104 lb 9.6 oz (47.4 kg)  10/03/18 110 lb (49.9 kg)  06/26/18 116 lb (52.6 kg)      Other studies Reviewed: Additional studies/ records that were reviewed today include: Holter 03/02/15  Review of the above records today demonstrates: Sinus rhythm sinus bradycardia PVC;s with slow bigeminny. If PVC;s not perfused effective HR as low as 24 bpm PAC;s and short bursts of atrial tachycardia less than 10 beats Average HR 54 bpm   TTE - Normal LV function; elevated LV filling pressure; mild LAE; traceAI, MR and TR.  ASSESSMENT AND PLAN:  1. PAF Currently on Xarelto and amiodarone.  Kehinde Bowdish check amiodarone labs  today.  This patients CHA2DS2-VASc Score and unadjusted Ischemic Stroke Rate (% per year) is equal to 4.8 % stroke rate/year from a score of 4  Above score calculated as 1 point each if present [CHF, HTN, DM, Vascular=MI/PAD/Aortic Plaque, Age if 65-74, or Female] Above score calculated as 2 points each if present [Age > 75, or Stroke/TIA/TE]   2. SSS: Chronic dual-chamber pacemaker implanted 11/25/2015.  Device functioning appropriately.  No changes.  3. Hypertension: Pressure is elevated but is usually lower at home.  She is also not taken her medications yet today.  No changes.   4. Hyperlipidemia: Oertli on statin, followed by PCP  Current medicines are reviewed at length with the patient today.  The patient does not have concerns regarding her medicines.  The following changes were made today: None  Labs/ tests ordered today include: none  Orders Placed This Encounter  Procedures  . TSH  . Hepatic function panel  . EKG 12-Lead     Disposition:   FU with Florean Hoobler 12 months  Signed, Zymeir Salminen Meredith Leeds, MD  03/25/2019 9:51 AM     [facility] [address] [place] [address] [phone] (office) [phone] (fax)

## 2019-04-03 ENCOUNTER — Encounter: Payer: Medicare Other | Admitting: *Deleted

## 2019-04-07 ENCOUNTER — Telehealth: Payer: Self-pay

## 2019-04-07 NOTE — Telephone Encounter (Signed)
Spoke with patient to remind of missed remote transmission 

## 2019-04-11 ENCOUNTER — Ambulatory Visit (INDEPENDENT_AMBULATORY_CARE_PROVIDER_SITE_OTHER): Payer: Medicare Other | Admitting: *Deleted

## 2019-04-11 DIAGNOSIS — I4891 Unspecified atrial fibrillation: Secondary | ICD-10-CM

## 2019-04-13 LAB — CUP PACEART REMOTE DEVICE CHECK
Battery Remaining Longevity: 85 mo
Battery Voltage: 3.01 V
Brady Statistic AP VP Percent: 0 %
Brady Statistic AP VS Percent: 99.24 %
Brady Statistic AS VP Percent: 0 %
Brady Statistic AS VS Percent: 0.76 %
Brady Statistic RA Percent Paced: 99.24 %
Brady Statistic RV Percent Paced: 0 %
Date Time Interrogation Session: 20201121101514
Implantable Lead Implant Date: 20170706
Implantable Lead Implant Date: 20170706
Implantable Lead Location: 753859
Implantable Lead Location: 753860
Implantable Lead Model: 5076
Implantable Lead Model: 5076
Implantable Pulse Generator Implant Date: 20170706
Lead Channel Impedance Value: 323 Ohm
Lead Channel Impedance Value: 399 Ohm
Lead Channel Impedance Value: 494 Ohm
Lead Channel Impedance Value: 627 Ohm
Lead Channel Pacing Threshold Amplitude: 0.625 V
Lead Channel Pacing Threshold Amplitude: 0.875 V
Lead Channel Pacing Threshold Pulse Width: 0.4 ms
Lead Channel Pacing Threshold Pulse Width: 0.4 ms
Lead Channel Sensing Intrinsic Amplitude: 1.5 mV
Lead Channel Sensing Intrinsic Amplitude: 1.5 mV
Lead Channel Sensing Intrinsic Amplitude: 16.375 mV
Lead Channel Sensing Intrinsic Amplitude: 16.375 mV
Lead Channel Setting Pacing Amplitude: 1.5 V
Lead Channel Setting Pacing Amplitude: 2.5 V
Lead Channel Setting Pacing Pulse Width: 0.4 ms
Lead Channel Setting Sensing Sensitivity: 2.8 mV

## 2019-04-21 ENCOUNTER — Telehealth: Payer: Self-pay

## 2019-04-21 NOTE — Telephone Encounter (Signed)
Copied from [place] [id number]. Topic: Referral - Request for Referral >> Apr 21, 2019  9:40 AM Yvette Rack wrote: Has patient seen PCP for this complaint? yes  *If NO, is insurance requiring patient see PCP for this issue before PCP can refer them? Referral for which specialty: foot specialist Preferred provider/office: Dr. Gershon Mussel at [facility] Reason for referral: Pt stated she has a callus on left foot

## 2019-04-21 NOTE — Telephone Encounter (Signed)
[place] for referral to podiatry.

## 2019-04-22 ENCOUNTER — Telehealth: Payer: Self-pay

## 2019-04-22 DIAGNOSIS — L84 Corns and callosities: Secondary | ICD-10-CM

## 2019-04-22 NOTE — Telephone Encounter (Signed)
Referral placed.

## 2019-04-22 NOTE — Telephone Encounter (Signed)
Referral Placed pt is aware

## 2019-04-22 NOTE — Telephone Encounter (Signed)
Pt has been notified.

## 2019-04-23 ENCOUNTER — Telehealth (INDEPENDENT_AMBULATORY_CARE_PROVIDER_SITE_OTHER): Payer: Medicare Other | Admitting: Family Medicine

## 2019-04-23 DIAGNOSIS — M546 Pain in thoracic spine: Secondary | ICD-10-CM | POA: Diagnosis not present

## 2019-04-23 NOTE — Progress Notes (Signed)
Virtual Visit via Telephone Note  I connected with [name] Small on 04/23/19 at  2:00 PM EST by telephone and verified that I am speaking with the correct person using two identifiers.   I discussed the limitations, risks, security and privacy concerns of performing an evaluation and management service by telephone and the availability of in person appointments. I also discussed with the patient that there may be a patient responsible charge related to this service. The patient expressed understanding and agreed to proceed.  Location patient: home Location provider: work or home office Participants present for the call: patient, provider Patient did not have a visit in the prior 7 days to address this/these issue(s).   History of Present Illness: Pt with R sided back pain in area under bra x 2 wks. Pt denies fever, chills, rash, lifting, pushing, pulling, dysuria, urinary frequency, coughing.  Pt taking Tylenol and heat helps.  Pt endorses feeling similar pain before when seen by Dr. Raliegh Ip.  Pt inquires if she needs an xray.   Observations/Objective: Patient sounds cheerful and well on the phone. I do not appreciate any SOB. Speech and thought processing are grossly intact. Patient reported vitals:  Assessment and Plan: Acute right-sided thoracic back pain -discussed possible causes including muscle strain, arthritis, UTI/pyleonephritis -discussed supportive care: continue heat, sitting in chair with good back support -pt to use Tylenol arthritis strength and OTC analgesic rub such as biofreeze or icy hot. -consider UA and imaging for continued symptoms  Follow Up Instructions: F/u in the next 1-2 wks for continued symptoms  [id number] 5-10 [id number] 11-20 9443 21-30 I did not refer this patient for an OV in the next 24 hours for this/these issue(s).  I discussed the assessment and treatment plan with the patient. The patient was provided an opportunity to ask questions and all were answered.  The patient agreed with the plan and demonstrated an understanding of the instructions.   The patient was advised to call back or seek an in-person evaluation if the symptoms worsen or if the condition fails to improve as anticipated.  I provided 7 minutes of non-face-to-face time during this encounter.   Billie Ruddy, MD

## 2019-05-02 ENCOUNTER — Other Ambulatory Visit: Payer: Self-pay

## 2019-05-02 ENCOUNTER — Ambulatory Visit: Payer: Medicare Other | Admitting: Podiatry

## 2019-05-02 ENCOUNTER — Encounter: Payer: Self-pay | Admitting: Podiatry

## 2019-05-02 DIAGNOSIS — Q828 Other specified congenital malformations of skin: Secondary | ICD-10-CM | POA: Insufficient documentation

## 2019-05-02 DIAGNOSIS — M2010 Hallux valgus (acquired), unspecified foot: Secondary | ICD-10-CM | POA: Diagnosis not present

## 2019-05-02 NOTE — Progress Notes (Signed)
This patient presents the office with chief complaint of a painful callus under the ball of her left foot.  She says this callus is painful walking wearing her shoes.  She says that callus has been worsening for the last few weeks.  She has a history of having bunion surgery the big toe joint right foot but her left big toe joint never had surgery.  She presents the office today for an evaluation and treatment of her painful callus left foot.  Vascular  Dorsalis pedis and posterior tibial pulses are weakly  palpable  B/L.  Capillary return  WNL.  Temperature gradient is  WNL.  Skin turgor  WNL  Sensorium  Senn Weinstein monofilament wire  WNL. Normal tactile sensation.  Nail Exam  Patient has normal nails with no evidence of bacterial or fungal infection.  Orthopedic  Exam  Muscle tone and muscle strength  WNL.  No limitations of motion feet  B/L.  No crepitus or joint effusion noted.  Foot type is unremarkable and digits show no abnormalities. HAV  B/L.[name]  Skin  No open lesions.  Normal skin texture and turgor.  Porokeratosis sub fibular sesamoid left foot.  Porokeratosis sub 1 left foot.  IE.  Debride porokeratosis left foot.  Padding to be applied to her footwear.  RTC.rnGardiner Barefoot DPMp

## 2019-05-12 NOTE — Progress Notes (Signed)
Remote pacemaker transmission.   

## 2019-05-28 NOTE — Progress Notes (Signed)
Date:  06/10/2019   ID:  SHACOYA CREGG, DOB 1929/03/14, MRN [id number]  Provider Location: Office  PCP:  Billie Ruddy, MD  Cardiologist:  Jenkins Rouge, MD  Electrophysiologist:  Constance Haw, MD   Evaluation Performed:  Follow-Up Visit  Chief Complaint:  Atrial fib   History of Present Illness:    [name] Small is a 86 y.o. female with SSS with pacer PAF, HTN, HLD and DM-2 Eliquis caused oral blisters and bleeding changed to Xarelto 15 mg daily. Pacer implanted Dr Curt Bears July 2017 MRI compatible. On amiodarone as well.   Device checked 10/02/18 and was normal, 5 beats of NSVT.   No a fib.    No new cardiac symptoms PACEART report reviewed 04/12/19 normal device function no PaF  Discussed getting COVID vaccine  No cardiac symptoms    The patient does not have symptoms concerning for COVID-19 infection (fever, chills, cough, or new shortness of breath).    Past Medical History:  Diagnosis Date  . Atrial fibrillation ([place])   . Bleeding stomach ulcer 1980s?  [name] Cancer ([place]) Meloanoma left leg  . DIVERTICULOSIS, COLON 11/22/2006  . Headache(784.0)    "very often; not regular" (11/25/2015)  . History of blood transfusion 1980s   "when I had the bleeding ulcer"  . HYPERLIPIDEMIA 11/22/2006  . Hypertension   . Menopausal syndrome (hot flashes)   . OSTEOPOROSIS 11/22/2006  . Presence of permanent cardiac pacemaker    Past Surgical History:  Procedure Laterality Date  . APPENDECTOMY    . BUNIONECTOMY Right   . CATARACT EXTRACTION W/ INTRAOCULAR LENS  IMPLANT, BILATERAL Bilateral 2000s  . CHOLECYSTECTOMY OPEN    . EP IMPLANTABLE DEVICE N/A 11/25/2015   Procedure: Pacemaker Implant;  Surgeon: Will Meredith Leeds, MD;  Location: [facility];  Service: Cardiovascular;  Laterality: N/A;  . FEMUR IM NAIL Left 07/08/2014   Procedure: INTRAMEDULLARY (IM) NAIL ;  Surgeon: Meredith Pel, MD;  Location: [facility];  Service: Orthopedics;  Laterality: Left;  . FRACTURE SURGERY     . REVISION TOTAL HIP ARTHROPLASTY Left 06/2014     Current Meds  Medication Sig  . acetaminophen (TYLENOL) 500 MG tablet Take 500 mg by mouth every 6 (six) hours as needed for mild pain or headache.   [name] amiodarone (PACERONE) 200 MG tablet TAKE 1 TABLET(200 MG) BY MOUTH DAILY  . atorvastatin (LIPITOR) 40 MG tablet TAKE 1 TABLET(40 MG) BY MOUTH DAILY  . Calcium Carbonate-Vitamin D (CALTRATE 600+D PO) Take 1 tablet by mouth daily.    . diclofenac sodium (VOLTAREN) 1 % GEL Apply 4 g topically 4 (four) times daily as needed.  [name] losartan (COZAAR) 50 MG tablet Take 1 tablet (50 mg total) by mouth daily.  . metoprolol tartrate (LOPRESSOR) 25 MG tablet Take 1 tablet (25 mg total) by mouth 2 (two) times daily.  . Polyvinyl Alcohol-Povidone (REFRESH OP) Place 1 drop into both eyes 2 (two) times daily.  . Rivaroxaban (XARELTO) 15 MG TABS tablet TAKE 1 TABLET(15 MG) BY MOUTH DAILY WITH DINNER     Allergies:   Septra ds [sulfamethoxazole-trimethoprim] and Lisinopril   Social History   Tobacco Use  . Smoking status: Never Smoker  . Smokeless tobacco: Never Used  Substance Use Topics  . Alcohol use: No    Alcohol/week: 0.0 standard drinks  . Drug use: No     Family Hx: The patient's family history includes Alzheimer's disease in her sister; Cardiomyopathy in her  brother.  ROS:   Please see the history of present illness.    General:no colds or fevers, no weight changes Skin:no rashes or ulcers HEENT:no blurred vision, no congestion CV:see HPI PUL:see HPI GI:no diarrhea constipation or melena, no indigestion GU:no hematuria, no dysuria MS:no joint pain, no claudication Neuro:no syncope, no lightheadedness Endo:no diabetes, no thyroid disease  All other systems reviewed and are negative.   Prior CV studies:   The following studies were reviewed today:  Echo 11/2014 Study Conclusions  - Left ventricle: The cavity size was normal. Wall thickness was   normal. Systolic function  was normal. The estimated ejection   fraction was in the range of 55% to 60%. Wall motion was normal;   there were no regional wall motion abnormalities. Doppler   parameters are consistent with high ventricular filling pressure. - Aortic valve: There was trivial regurgitation. - Left atrium: The atrium was mildly dilated.  Impressions:  - Normal LV function; elevated LV filling pressure; mild LAE; trace   AI, MR and TR.   Labs/Other Tests and Data Reviewed:    EKG:   03/25/19 A paced V sensed RBBB   Recent Labs: 11/18/2018: BUN 17; Creatinine, Ser 1.29; Hemoglobin 13.8; Platelets 213; Potassium 4.7; Sodium 139 03/25/2019: ALT 14; TSH 3.560   Recent Lipid Panel Lab Results  Component Value Date/Time   CHOL 163 08/01/2017 10:47 AM   TRIG 67.0 08/01/2017 10:47 AM   HDL 75.50 08/01/2017 10:47 AM   CHOLHDL 2 08/01/2017 10:47 AM   LDLCALC 74 08/01/2017 10:47 AM   LDLDIRECT 176.0 02/24/2008 02:24 PM    Wt Readings from Last 3 Encounters:  06/10/19 116 lb (52.6 kg)  03/25/19 104 lb 9.6 oz (47.4 kg)  10/03/18 110 lb (49.9 kg)     Objective:    Vital Signs:  BP 140/64   Pulse 70   Ht 5\' 5"  (1.651 m)   Wt 116 lb (52.6 kg)   SpO2 95%   BMI 19.30 kg/m    Affect appropriate Elderly white female  HEENT: normal Neck supple with no adenopathy JVP normal no bruits no thyromegaly Lungs clear with no wheezing and good diaphragmatic motion Heart:  S1/S2 no murmur, no rub, gallop or click PMI normal pacer under right clavicle  Abdomen: benighn, BS positve, no tenderness, no AAA no bruit.  No HSM or HJR Distal pulses intact with no bruits No edema Neuro non-focal Skin warm and dry No muscular weakness   ASSESSMENT & PLAN:    1. PAF and maintaining SR on amiodarone.  LFTls/TSH noraml 03/25/19  2. Anticoagulation on xarelto, denies and bleeding. 3. HTN controlled continue current meds 4. SSS has MDT MRI compatible device.  Follow up with Dr. Curt Bears in 4 months. Normal  function  5. HLD per PCP.   COVID-19 Education: The signs and symptoms of COVID-19 were discussed with the patient and how to seek care for testing (follow up with PCP or arrange E-visit).  The importance of social distancing was discussed today.     Medication Adjustments/Labs and Tests Ordered: Current medicines are reviewed at length with the patient today.  Concerns regarding medicines are outlined above.   Tests Ordered: No orders of the defined types were placed in this encounter.   Medication Changes: No orders of the defined types were placed in this encounter.   Disposition:  Follow up with EP 6 months    Signed, Jenkins Rouge, MD  06/10/2019 8:47 AM    [facility]

## 2019-05-29 LAB — CUP PACEART INCLINIC DEVICE CHECK
Date Time Interrogation Session: 20201103132147
Implantable Lead Implant Date: 20170706
Implantable Lead Implant Date: 20170706
Implantable Lead Location: 753859
Implantable Lead Location: 753860
Implantable Lead Model: 5076
Implantable Lead Model: 5076
Implantable Pulse Generator Implant Date: 20170706

## 2019-06-10 ENCOUNTER — Encounter: Payer: Self-pay | Admitting: Cardiovascular Disease

## 2019-06-10 ENCOUNTER — Ambulatory Visit: Payer: Medicare Other | Admitting: Cardiovascular Disease

## 2019-06-10 ENCOUNTER — Other Ambulatory Visit: Payer: Self-pay

## 2019-06-10 VITALS — BP 140/64 | HR 70 | Ht 65.0 in | Wt 116.0 lb

## 2019-06-10 DIAGNOSIS — I480 Paroxysmal atrial fibrillation: Secondary | ICD-10-CM | POA: Diagnosis not present

## 2019-06-10 NOTE — Patient Instructions (Addendum)
Medication Instructions:   *If you need a refill on your cardiac medications before your next appointment, please call your pharmacy*  Lab Work:  If you have labs (blood work) drawn today and your tests are completely normal, you will receive your results only by: [name] MyChart Message (if you have MyChart) OR . A paper copy in the mail If you have any lab test that is abnormal or we need to change your treatment, we will call you to review the results.  Testing/Procedures: None ordered today.  Follow-Up: At [facility], you and your health needs are our priority.  As part of our continuing mission to provide you with exceptional heart care, we have created designated Provider Care Teams.  These Care Teams include your primary Cardiologist (physician) and Advanced Practice Providers (APPs -  Physician Assistants and Nurse Practitioners) who all work together to provide you with the care you need, when you need it.  Your next appointment:   5 month(s)  The format for your next appointment:   In Person  Provider:   You may see Dr. Curt Bears or one of the following Advanced Practice Providers on your designated Care Team:

## 2019-07-04 ENCOUNTER — Ambulatory Visit: Payer: Medicare Other | Attending: Internal Medicine

## 2019-07-04 DIAGNOSIS — Z23 Encounter for immunization: Secondary | ICD-10-CM

## 2019-07-04 NOTE — Progress Notes (Signed)
   Covid-19 Vaccination Clinic  Name:  [name] Small    MRN: [id number] DOB: 02-12-1929  07/04/2019  [name] was observed post Covid-19 immunization for 15 minutes without incidence. She was provided with Vaccine Information Sheet and instruction to access the V-Safe system.   [name] was instructed to call 911 with any severe reactions post vaccine: [name] Difficulty breathing  . Swelling of your face and throat  . A fast heartbeat  . A bad rash all over your body  . Dizziness and weakness    Immunizations Administered    Name Date Dose VIS Date Route   Pfizer COVID-19 Vaccine 07/04/2019  4:46 PM 0.3 mL 05/02/2019 Intramuscular   Manufacturer: [place]   Lot: [id number]   [place]: [id number]

## 2019-07-05 ENCOUNTER — Ambulatory Visit: Payer: Medicare Other

## 2019-07-11 ENCOUNTER — Ambulatory Visit (INDEPENDENT_AMBULATORY_CARE_PROVIDER_SITE_OTHER): Payer: Medicare Other | Admitting: *Deleted

## 2019-07-11 DIAGNOSIS — I4891 Unspecified atrial fibrillation: Secondary | ICD-10-CM | POA: Diagnosis not present

## 2019-07-11 LAB — CUP PACEART REMOTE DEVICE CHECK
Battery Remaining Longevity: 84 mo
Battery Voltage: 3.01 V
Brady Statistic AP VP Percent: 0.09 %
Brady Statistic AP VS Percent: 96.76 %
Brady Statistic AS VP Percent: 0.01 %
Brady Statistic AS VS Percent: 3.14 %
Brady Statistic RA Percent Paced: 96.81 %
Brady Statistic RV Percent Paced: 0.09 %
Date Time Interrogation Session: 20210219135559
Implantable Lead Implant Date: 20170706
Implantable Lead Implant Date: 20170706
Implantable Lead Location: 753859
Implantable Lead Location: 753860
Implantable Lead Model: 5076
Implantable Lead Model: 5076
Implantable Pulse Generator Implant Date: 20170706
Lead Channel Impedance Value: 342 Ohm
Lead Channel Impedance Value: 418 Ohm
Lead Channel Impedance Value: 475 Ohm
Lead Channel Impedance Value: 608 Ohm
Lead Channel Pacing Threshold Amplitude: 0.625 V
Lead Channel Pacing Threshold Amplitude: 1.125 V
Lead Channel Pacing Threshold Pulse Width: 0.4 ms
Lead Channel Pacing Threshold Pulse Width: 0.4 ms
Lead Channel Sensing Intrinsic Amplitude: 1.25 mV
Lead Channel Sensing Intrinsic Amplitude: 1.25 mV
Lead Channel Sensing Intrinsic Amplitude: 18.75 mV
Lead Channel Sensing Intrinsic Amplitude: 18.75 mV
Lead Channel Setting Pacing Amplitude: 1.5 V
Lead Channel Setting Pacing Amplitude: 2.5 V
Lead Channel Setting Pacing Pulse Width: 0.4 ms
Lead Channel Setting Sensing Sensitivity: 2.8 mV

## 2019-07-11 NOTE — Progress Notes (Signed)
PPM Remote  

## 2019-07-27 ENCOUNTER — Ambulatory Visit: Payer: Medicare Other | Attending: Internal Medicine

## 2019-07-27 DIAGNOSIS — Z23 Encounter for immunization: Secondary | ICD-10-CM

## 2019-07-27 NOTE — Progress Notes (Signed)
   Covid-19 Vaccination Clinic  Name:  BEXLEY BOOTHE    MRN: [id number] DOB: [birth year]  07/27/2019  Ms. Barnwell was observed post Covid-19 immunization for 15 minutes without incident. She was provided with Vaccine Information Sheet and instruction to access the V-Safe system.   Ms. Parrish was instructed to call 911 with any severe reactions post vaccine: [name] Difficulty breathing  . Swelling of face and throat  . A fast heartbeat  . A bad rash all over body  . Dizziness and weakness   Immunizations Administered    Name Date Dose VIS Date Route   Pfizer COVID-19 Vaccine 07/27/2019 11:04 AM 0.3 mL 05/02/2019 Intramuscular   Manufacturer: [place]   Lot: [id number]   [place]: [id number]

## 2019-08-04 ENCOUNTER — Telehealth: Payer: Self-pay | Admitting: Family Medicine

## 2019-08-04 NOTE — Telephone Encounter (Signed)
Tie from [facility] calls was calling to let Dr. Volanda Napoleon know that they did a quancaflo test on the patient sand it showed significant P.A.D. in her lower extremities. The full report will be sent in 2-3 weeks.

## 2019-08-06 NOTE — Telephone Encounter (Signed)
FYI

## 2019-09-05 ENCOUNTER — Other Ambulatory Visit: Payer: Self-pay | Admitting: Family Medicine

## 2019-09-09 DIAGNOSIS — R69 Illness, unspecified: Secondary | ICD-10-CM | POA: Diagnosis not present

## 2019-09-26 ENCOUNTER — Ambulatory Visit: Payer: Medicare Other | Admitting: Student

## 2019-09-26 ENCOUNTER — Other Ambulatory Visit: Payer: Self-pay

## 2019-09-26 ENCOUNTER — Encounter: Payer: Self-pay | Admitting: Student

## 2019-09-26 VITALS — BP 144/80 | HR 61 | Ht 65.0 in | Wt 114.4 lb

## 2019-09-26 DIAGNOSIS — I10 Essential (primary) hypertension: Secondary | ICD-10-CM | POA: Diagnosis not present

## 2019-09-26 DIAGNOSIS — I4891 Unspecified atrial fibrillation: Secondary | ICD-10-CM | POA: Diagnosis not present

## 2019-09-26 DIAGNOSIS — I495 Sick sinus syndrome: Secondary | ICD-10-CM

## 2019-09-26 DIAGNOSIS — Z79899 Other long term (current) drug therapy: Secondary | ICD-10-CM

## 2019-09-26 LAB — CUP PACEART INCLINIC DEVICE CHECK
Date Time Interrogation Session: 20210507121448
Implantable Lead Implant Date: 20170706
Implantable Lead Implant Date: 20170706
Implantable Lead Location: 753859
Implantable Lead Location: 753860
Implantable Lead Model: 5076
Implantable Lead Model: 5076
Implantable Pulse Generator Implant Date: 20170706

## 2019-09-26 NOTE — Patient Instructions (Signed)
Medication Instructions:  NONE *If you need a refill on your cardiac medications before your next appointment, please call your pharmacy*   Lab Work:  TODAY CMET CBC TSH If you have labs (blood work) drawn today and your tests are completely normal, you will receive your results only by: [name] MyChart Message (if you have MyChart) OR . A paper copy in the mail If you have any lab test that is abnormal or we need to change your treatment, we will call you to review the results.   Testing/Procedures: NONE   Follow-Up: At [facility], you and your health needs are our priority.  As part of our continuing mission to provide you with exceptional heart care, we have created designated Provider Care Teams.  These Care Teams include your primary Cardiologist (physician) and Advanced Practice Providers (APPs -  Physician Assistants and Nurse Practitioners) who all work together to provide you with the care you need, when you need it.  We recommend signing up for the patient portal called "MyChart".  Sign up information is provided on this After Visit Summary.  MyChart is used to connect with patients for Virtual Visits (Telemedicine).  Patients are able to view lab/test results, encounter notes, upcoming appointments, etc.  Non-urgent messages can be sent to your provider as well.   To learn more about what you can do with MyChart, go to [email or web address].    Your next appointment:   6 MONTHS  The format for your next appointment:   In Person  Provider:   Oda Kilts, PA   Other Instructions Remote monitoring is used to monitor your Pacemaker  from home. This monitoring reduces the number of office visits required to check your device to one time per year. It allows [place] to keep an eye on the functioning of your device to ensure it is working properly. You are scheduled for a device check from home on 10/10/19. You may send your transmission at any time that day. If you have a wireless  device, the transmission will be sent automatically. After your physician reviews your transmission, you will receive a postcard with your next transmission date.

## 2019-09-26 NOTE — Progress Notes (Signed)
Electrophysiology Office Note Date: 09/26/2019  ID:  [name] Small, DOB [birth year], MRN [id number]  PCP: Billie Ruddy, MD Primary Cardiologist: Jenkins Rouge, MD Electrophysiologist: Constance Haw, MD   CC: Pacemaker follow-up  [name] Small is a 86 y.o. female seen today for Will Meredith Leeds, MD for routine electrophysiology followup.  Since last being seen in our clinic the patient reports doing very well.  she denies chest pain, palpitations, dyspnea, PND, orthopnea, nausea, vomiting, dizziness, syncope, edema, weight gain, or early satiety.  Device History: Medtronic Dual Chamber PPM implanted 2017 for sick sinus syndrome  Past Medical History:  Diagnosis Date  . Atrial fibrillation ([place])   . Bleeding stomach ulcer 1980s?  [name] Cancer ([place]) Meloanoma left leg  . DIVERTICULOSIS, COLON 11/22/2006  . Headache(784.0)    "very often; not regular" (11/25/2015)  . History of blood transfusion 1980s   "when I had the bleeding ulcer"  . HYPERLIPIDEMIA 11/22/2006  . Hypertension   . Menopausal syndrome (hot flashes)   . OSTEOPOROSIS 11/22/2006  . Presence of permanent cardiac pacemaker    Past Surgical History:  Procedure Laterality Date  . APPENDECTOMY    . BUNIONECTOMY Right   . CATARACT EXTRACTION W/ INTRAOCULAR LENS  IMPLANT, BILATERAL Bilateral 2000s  . CHOLECYSTECTOMY OPEN    . EP IMPLANTABLE DEVICE N/A 11/25/2015   Procedure: Pacemaker Implant;  Surgeon: Will Meredith Leeds, MD;  Location: [place] [facility];  Service: Cardiovascular;  Laterality: N/A;  . FEMUR IM NAIL Left 07/08/2014   Procedure: INTRAMEDULLARY (IM) NAIL ;  Surgeon: Meredith Pel, MD;  Location: [facility];  Service: Orthopedics;  Laterality: Left;  . FRACTURE SURGERY    . REVISION TOTAL HIP ARTHROPLASTY Left 06/2014    Current Outpatient Medications  Medication Sig Dispense Refill  . acetaminophen (TYLENOL) 500 MG tablet Take 500 mg by mouth every 6 (six) hours as needed for mild pain or headache.       [name] amiodarone (PACERONE) 200 MG tablet TAKE 1 TABLET(200 MG) BY MOUTH DAILY 90 tablet 3  . atorvastatin (LIPITOR) 40 MG tablet TAKE 1 TABLET(40 MG) BY MOUTH DAILY 90 tablet 0  . Calcium Carbonate-Vitamin D (CALTRATE 600+D PO) Take 1 tablet by mouth daily.      . diclofenac sodium (VOLTAREN) 1 % GEL Apply 4 g topically 4 (four) times daily as needed. 500 g 6  . losartan (COZAAR) 50 MG tablet Take 1 tablet (50 mg total) by mouth daily. 90 tablet 3  . metoprolol tartrate (LOPRESSOR) 25 MG tablet Take 1 tablet (25 mg total) by mouth 2 (two) times daily. 60 tablet 11  . Polyvinyl Alcohol-Povidone (REFRESH OP) Place 1 drop into both eyes 2 (two) times daily.    . Rivaroxaban (XARELTO) 15 MG TABS tablet TAKE 1 TABLET(15 MG) BY MOUTH DAILY WITH DINNER 30 tablet 5   No current facility-administered medications for this visit.    Allergies:   Septra ds [sulfamethoxazole-trimethoprim] and Lisinopril   Social History: Social History   Socioeconomic History  . Marital status: Widowed    Spouse name: Not on file  . Number of children: 2  . Years of education: 55  . Highest education level: Not on file  Occupational History    Comment: retired  Tobacco Use  . Smoking status: Never Smoker  . Smokeless tobacco: Never Used  Substance and Sexual Activity  . Alcohol use: No    Alcohol/week: 0.0 standard drinks  . Drug use: No  .  Sexual activity: Never  Other Topics Concern  . Not on file  Social History Narrative   Lives alone, son lives beside her   Caffeine - none   Social Determinants of Health   Financial Resource Strain:   . Difficulty of Paying Living Expenses:   Food Insecurity:   . Worried About Charity fundraiser in the Last Year:   . Arboriculturist in the Last Year:   Transportation Needs:   . Film/video editor (Medical):   [name] Lack of Transportation (Non-Medical):   Physical Activity:   . Days of Exercise per Week:   . Minutes of Exercise per Session:   Stress:   .  Feeling of Stress :   Social Connections:   . Frequency of Communication with Friends and Family:   . Frequency of Social Gatherings with Friends and Family:   . Attends Religious Services:   . Active Member of Clubs or Organizations:   . Attends Archivist Meetings:   [name] Marital Status:   Intimate Partner Violence:   . Fear of Current or Ex-Partner:   . Emotionally Abused:   [name] Physically Abused:   . Sexually Abused:     Family History: Family History  Problem Relation Age of Onset  . Alzheimer's disease Sister   . Cardiomyopathy Brother      Review of Systems: All other systems reviewed and are otherwise negative except as noted above.  Physical Exam: Vitals:   09/26/19 1133  BP: (!) 144/80  Pulse: 61  SpO2: 99%  Weight: 114 lb 6.4 oz (51.9 kg)  Height: '5\' 5"'$  (1.651 m)     GEN- The patient is well appearing, alert and oriented x 3 today.   HEENT: normocephalic, atraumatic; sclera clear, conjunctiva pink; hearing intact; oropharynx clear; neck supple  Lungs- Clear to ausculation bilaterally, normal work of breathing.  No wheezes, rales, rhonchi Heart- Regular rate and rhythm, no murmurs, rubs or gallops  GI- soft, non-tender, non-distended, bowel sounds present  Extremities- no clubbing, cyanosis, or edema  MS- no significant deformity or atrophy Skin- warm and dry, no rash or lesion; PPM pocket well healed Psych- euthymic mood, full affect Neuro- strength and sensation are intact  PPM Interrogation- reviewed in detail today,  See PACEART report  EKG:  EKG is ordered today. The ekg ordered today shows NSR at 61 bpm with normal intervals  Recent Labs: 11/18/2018: BUN 17; Creatinine, Ser 1.29; Hemoglobin 13.8; Platelets 213; Potassium 4.7; Sodium 139 03/25/2019: ALT 14; TSH 3.560   Wt Readings from Last 3 Encounters:  09/26/19 114 lb 6.4 oz (51.9 kg)  06/10/19 116 lb (52.6 kg)  03/25/19 104 lb 9.6 oz (47.4 kg)     Other studies Reviewed: Additional  studies/ records that were reviewed today include: Previous EP office notes, Previous remote checks, Most recent labwork.   Assessment and Plan:  1. Sick sinus syndrome s/p Medtronic PPM  Normal PPM function See Pace Art report No changes today  2. Paroxysmal atrial fibrillation Stable on amiodarone. Surveillance labs today Continue Xarelto for CHA2DS2VASC of at last 4    3. HTN Continue current medications She reports her pressures run in the 664Q systolic at home.   4. HLD Followed by PCP  Current medicines are reviewed at length with the patient today.   The patient does not have concerns regarding her medicines.  The following changes were made today:  none  Labs/ tests ordered today include:  Orders Placed  This Encounter  Procedures  . Comp Met (CMET)  . CBC  . TSH  . CUP PACEART [place]  . EKG 12-Lead   Disposition:   Follow up with EP APP in 6 Months   Signed, Shirley Friar, PA-C  09/26/2019 12:16 PM  [place]  [phone] (office) [phone] (fax)

## 2019-09-27 LAB — COMPREHENSIVE METABOLIC PANEL
ALT: 12 IU/L (ref 0–32)
AST: 20 IU/L (ref 0–40)
Albumin/Globulin Ratio: 1.7 (ref 1.2–2.2)
Albumin: 4.4 g/dL (ref 3.6–4.6)
Alkaline Phosphatase: 87 IU/L (ref 39–117)
BUN/Creatinine Ratio: 14 (ref 12–28)
BUN: 16 mg/dL (ref 8–27)
Bilirubin Total: 0.5 mg/dL (ref 0.0–1.2)
CO2: 24 mmol/L (ref 20–29)
Calcium: 9.4 mg/dL (ref 8.7–10.3)
Chloride: 104 mmol/L (ref 96–106)
Creatinine, Ser: 1.14 mg/dL — ABNORMAL HIGH (ref 0.57–1.00)
GFR calc Af Amer: 50 mL/min/{1.73_m2} — ABNORMAL LOW (ref >59)
GFR calc non Af Amer: 43 mL/min/{1.73_m2} — ABNORMAL LOW (ref >59)
Globulin, Total: 2.6 g/dL (ref 1.5–4.5)
Glucose: 84 mg/dL (ref 65–99)
Potassium: 4.3 mmol/L (ref 3.5–5.2)
Sodium: 141 mmol/L (ref 134–144)
Total Protein: 7 g/dL (ref 6.0–8.5)

## 2019-09-27 LAB — CBC
Hematocrit: 41.3 % (ref 34.0–46.6)
Hemoglobin: 13.9 g/dL (ref 11.1–15.9)
MCH: 30.5 pg (ref 26.6–33.0)
MCHC: 33.7 g/dL (ref 31.5–35.7)
MCV: 91 fL (ref 79–97)
Platelets: 192 10*3/uL (ref 150–450)
RBC: 4.55 x10E6/uL (ref 3.77–5.28)
RDW: 13 % (ref 11.7–15.4)
WBC: 7.1 10*3/uL (ref 3.4–10.8)

## 2019-09-27 LAB — TSH: TSH: 3.08 u[IU]/mL (ref 0.450–4.500)

## 2019-10-10 ENCOUNTER — Ambulatory Visit (INDEPENDENT_AMBULATORY_CARE_PROVIDER_SITE_OTHER): Payer: Medicare Other | Admitting: *Deleted

## 2019-10-10 DIAGNOSIS — I495 Sick sinus syndrome: Secondary | ICD-10-CM

## 2019-10-10 LAB — CUP PACEART REMOTE DEVICE CHECK
Battery Remaining Longevity: 84 mo
Battery Voltage: 3.01 V
Brady Statistic AP VP Percent: 0.09 %
Brady Statistic AP VS Percent: 97.8 %
Brady Statistic AS VP Percent: 0.02 %
Brady Statistic AS VS Percent: 2.09 %
Brady Statistic RA Percent Paced: 97.86 %
Brady Statistic RV Percent Paced: 0.09 %
Date Time Interrogation Session: 20210521102327
Implantable Lead Implant Date: 20170706
Implantable Lead Implant Date: 20170706
Implantable Lead Location: 753859
Implantable Lead Location: 753860
Implantable Lead Model: 5076
Implantable Lead Model: 5076
Implantable Pulse Generator Implant Date: 20170706
Lead Channel Impedance Value: 323 Ohm
Lead Channel Impedance Value: 399 Ohm
Lead Channel Impedance Value: 456 Ohm
Lead Channel Impedance Value: 589 Ohm
Lead Channel Pacing Threshold Amplitude: 0.5 V
Lead Channel Pacing Threshold Amplitude: 1 V
Lead Channel Pacing Threshold Pulse Width: 0.4 ms
Lead Channel Pacing Threshold Pulse Width: 0.4 ms
Lead Channel Sensing Intrinsic Amplitude: 1.5 mV
Lead Channel Sensing Intrinsic Amplitude: 1.5 mV
Lead Channel Sensing Intrinsic Amplitude: 17.125 mV
Lead Channel Sensing Intrinsic Amplitude: 17.125 mV
Lead Channel Setting Pacing Amplitude: 1.5 V
Lead Channel Setting Pacing Amplitude: 2.5 V
Lead Channel Setting Pacing Pulse Width: 0.4 ms
Lead Channel Setting Sensing Sensitivity: 2.8 mV

## 2019-10-13 DIAGNOSIS — R69 Illness, unspecified: Secondary | ICD-10-CM | POA: Diagnosis not present

## 2019-10-13 NOTE — Progress Notes (Signed)
Remote pacemaker transmission.   

## 2019-10-22 ENCOUNTER — Other Ambulatory Visit: Payer: Self-pay

## 2019-10-22 MED ORDER — RIVAROXABAN 15 MG PO TABS: ORAL_TABLET | ORAL | 5 refills | Status: DC

## 2019-10-22 NOTE — Telephone Encounter (Signed)
Pt last saw Barrington Ellison, [place] on 09/26/19, last labs 09/26/19 Creat 1.14, age 86, weight 51.9, CrCl 27.95, based on CrCl pt is on appropriate dosage of Xarelto 15mg  QD.  Will refill rx.

## 2019-12-01 ENCOUNTER — Other Ambulatory Visit: Payer: Self-pay | Admitting: Family Medicine

## 2020-01-02 DIAGNOSIS — M25511 Pain in right shoulder: Secondary | ICD-10-CM | POA: Diagnosis not present

## 2020-01-02 DIAGNOSIS — M898X1 Other specified disorders of bone, shoulder: Secondary | ICD-10-CM | POA: Diagnosis not present

## 2020-01-03 DIAGNOSIS — Z950 Presence of cardiac pacemaker: Secondary | ICD-10-CM | POA: Diagnosis not present

## 2020-01-03 DIAGNOSIS — M25511 Pain in right shoulder: Secondary | ICD-10-CM | POA: Diagnosis not present

## 2020-01-09 ENCOUNTER — Ambulatory Visit (INDEPENDENT_AMBULATORY_CARE_PROVIDER_SITE_OTHER): Payer: Medicare Other | Admitting: *Deleted

## 2020-01-09 DIAGNOSIS — I495 Sick sinus syndrome: Secondary | ICD-10-CM | POA: Diagnosis not present

## 2020-01-09 LAB — CUP PACEART REMOTE DEVICE CHECK
Battery Remaining Longevity: 76 mo
Battery Voltage: 3.01 V
Brady Statistic AP VP Percent: 0.1 %
Brady Statistic AP VS Percent: 96.43 %
Brady Statistic AS VP Percent: 0.01 %
Brady Statistic AS VS Percent: 3.46 %
Brady Statistic RA Percent Paced: 96.5 %
Brady Statistic RV Percent Paced: 0.1 %
Date Time Interrogation Session: 20210820095958
Implantable Lead Implant Date: 20170706
Implantable Lead Implant Date: 20170706
Implantable Lead Location: 753859
Implantable Lead Location: 753860
Implantable Lead Model: 5076
Implantable Lead Model: 5076
Implantable Pulse Generator Implant Date: 20170706
Lead Channel Impedance Value: 342 Ohm
Lead Channel Impedance Value: 418 Ohm
Lead Channel Impedance Value: 437 Ohm
Lead Channel Impedance Value: 589 Ohm
Lead Channel Pacing Threshold Amplitude: 0.5 V
Lead Channel Pacing Threshold Amplitude: 1.125 V
Lead Channel Pacing Threshold Pulse Width: 0.4 ms
Lead Channel Pacing Threshold Pulse Width: 0.4 ms
Lead Channel Sensing Intrinsic Amplitude: 1.625 mV
Lead Channel Sensing Intrinsic Amplitude: 1.625 mV
Lead Channel Sensing Intrinsic Amplitude: 19.875 mV
Lead Channel Sensing Intrinsic Amplitude: 19.875 mV
Lead Channel Setting Pacing Amplitude: 1.5 V
Lead Channel Setting Pacing Amplitude: 2.5 V
Lead Channel Setting Pacing Pulse Width: 0.4 ms
Lead Channel Setting Sensing Sensitivity: 2.8 mV

## 2020-01-12 NOTE — Progress Notes (Signed)
Remote pacemaker transmission.   

## 2020-01-15 DIAGNOSIS — C4441 Basal cell carcinoma of skin of scalp and neck: Secondary | ICD-10-CM | POA: Diagnosis not present

## 2020-01-15 DIAGNOSIS — D485 Neoplasm of uncertain behavior of skin: Secondary | ICD-10-CM | POA: Diagnosis not present

## 2020-01-30 ENCOUNTER — Other Ambulatory Visit: Payer: Self-pay | Admitting: Cardiovascular Disease

## 2020-01-31 DIAGNOSIS — R109 Unspecified abdominal pain: Secondary | ICD-10-CM | POA: Diagnosis not present

## 2020-02-02 ENCOUNTER — Encounter: Payer: Self-pay | Admitting: Family Medicine

## 2020-02-02 ENCOUNTER — Ambulatory Visit (INDEPENDENT_AMBULATORY_CARE_PROVIDER_SITE_OTHER): Payer: Medicare Other | Admitting: Family Medicine

## 2020-02-02 ENCOUNTER — Other Ambulatory Visit: Payer: Self-pay

## 2020-02-02 VITALS — BP 140/90 | HR 68 | Temp 98.0°F | Ht 65.0 in | Wt 116.1 lb

## 2020-02-02 DIAGNOSIS — M549 Dorsalgia, unspecified: Secondary | ICD-10-CM

## 2020-02-02 DIAGNOSIS — R109 Unspecified abdominal pain: Secondary | ICD-10-CM

## 2020-02-02 LAB — POC URINALSYSI DIPSTICK (AUTOMATED)
Bilirubin, UA: NEGATIVE
Blood, UA: NEGATIVE
Glucose, UA: NEGATIVE
Ketones, UA: NEGATIVE
Nitrite, UA: NEGATIVE
Protein, UA: NEGATIVE
Spec Grav, UA: 1.025 (ref 1.010–1.025)
Urobilinogen, UA: 0.2 E.U./dL
pH, UA: 5 (ref 5.0–8.0)

## 2020-02-02 NOTE — Progress Notes (Signed)
[name] Small DOB: 1928/08/11 Encounter date: 02/02/2020  This is a 86 y.o. female who presents with Chief Complaint  Patient presents with  . Back Pain    patient complains of right flank pain x1 month, seen at [facility] urgent care Saturday and advised to take Tylenol 1000mg  three times a day, which provides some relief and use heat, denies an injury or dysuria, loss of bladder function last night    History of present illness: No fevers, chills. Has been to urgent care twice. Initially given keflex which helped. Med sent on sat didn't help. Pain is right under bra. Pain has been in same location since it started. No spine tenderness. Had imaging, labwork, urine studies at urgent care. No improvement with recent antibiotic (not sure name).   Pain is 7/10 when severe; resolves to 0/10 with tylenol. Lasts about 15 minutes when she gets it.   Doesn't hurt to move arm, twist, breathe. No cough. No abd pain. Bowel movements have been normal.   Had incontinence last night without knowing. Gown was wet. Doesn't usually have incontinence issues. No blood in urine, no dysuria, no odor to urine.   Allergies  Allergen Reactions  . Septra Ds [Sulfamethoxazole-Trimethoprim]   . Lisinopril Other (See Comments)    Cough   Current Meds  Medication Sig  . acetaminophen (TYLENOL) 500 MG tablet Take 500 mg by mouth every 6 (six) hours as needed for mild pain or headache.   [name] amiodarone (PACERONE) 200 MG tablet TAKE 1 TABLET(200 MG) BY MOUTH DAILY  . atorvastatin (LIPITOR) 40 MG tablet TAKE 1 TABLET(40 MG) BY MOUTH DAILY  . Calcium Carbonate-Vitamin D (CALTRATE 600+D PO) Take 1 tablet by mouth daily.    . diclofenac sodium (VOLTAREN) 1 % GEL Apply 4 g topically 4 (four) times daily as needed.  [name] losartan (COZAAR) 50 MG tablet Take 1 tablet (50 mg total) by mouth daily.  . metoprolol tartrate (LOPRESSOR) 25 MG tablet Take 1 tablet (25 mg total) by mouth 2 (two) times daily.  . Polyvinyl  Alcohol-Povidone (REFRESH OP) Place 1 drop into both eyes 2 (two) times daily.  . Rivaroxaban (XARELTO) 15 MG TABS tablet TAKE 1 TABLET(15 MG) BY MOUTH DAILY WITH DINNER    Review of Systems  Constitutional: Negative for chills, fatigue and fever.  Respiratory: Negative for cough, chest tightness, shortness of breath and wheezing.   Cardiovascular: Negative for chest pain, palpitations and leg swelling.  Genitourinary: Positive for flank pain. Negative for difficulty urinating, dysuria, frequency and hematuria.    Objective:  BP 140/90 (BP Location: Left Arm, Patient Position: Sitting, Cuff Size: Normal)   Pulse 68   Temp 98 F (36.7 C) (Oral)   Ht 5\' 5"  (1.651 m)   Wt 116 lb 1.6 oz (52.7 kg)   SpO2 98%   BMI 19.32 kg/m   Weight: 116 lb 1.6 oz (52.7 kg)   BP Readings from Last 3 Encounters:  02/02/20 140/90  09/26/19 (!) 144/80  06/10/19 140/64   Wt Readings from Last 3 Encounters:  02/02/20 116 lb 1.6 oz (52.7 kg)  09/26/19 114 lb 6.4 oz (51.9 kg)  06/10/19 116 lb (52.6 kg)    Physical Exam Constitutional:      General: She is not in acute distress.    Appearance: She is well-developed.  Cardiovascular:     Rate and Rhythm: Normal rate and regular rhythm.     Heart sounds: Normal heart sounds. No murmur heard.  No friction  rub.  Pulmonary:     Effort: Pulmonary effort is normal. No respiratory distress.     Breath sounds: Normal breath sounds. No wheezing or rales.  Abdominal:     General: Abdomen is flat. Bowel sounds are normal.     Palpations: Abdomen is soft.     Tenderness: There is no abdominal tenderness. There is no right CVA tenderness, left CVA tenderness, guarding or rebound. Negative signs include Murphy's sign and McBurney's sign.     Hernia: No hernia is present.     Comments: There is mild tenderness upper thoracic spine to palpation. She is not currently having flank pain, but she relates that pain is located right flank when it occurs.   Musculoskeletal:     Right lower leg: No edema.     Left lower leg: No edema.  Neurological:     Mental Status: She is alert and oriented to person, place, and time.  Psychiatric:        Behavior: Behavior normal.     Assessment/Plan  1. Right flank pain I would like to assess for stone. UA showing blood; culture was negative from 9/11 at urgent care. Pain is colic in nature.  - CT RENAL STONE STUDY; Future  2. Upper back pain Uncertain if tenderness new/old. No known injury. Will see how CT looks in comparison to xray. No pain with twisting/turning or reproducible pain on exam except a mild tenderness with palpation of upper thoracic spine that is NOT reproducing same flank pain she complains of.  (urine culture repeated today)  Return for pending imaging results.    Micheline Rough, MD

## 2020-02-02 NOTE — Patient Instructions (Signed)
Keep up with tylenol as needed for pain.

## 2020-02-03 LAB — URINE CULTURE
MICRO NUMBER:: 10941990
SPECIMEN QUALITY:: ADEQUATE

## 2020-02-04 NOTE — Progress Notes (Signed)
Noted ok. 

## 2020-02-10 DIAGNOSIS — C4441 Basal cell carcinoma of skin of scalp and neck: Secondary | ICD-10-CM | POA: Diagnosis not present

## 2020-02-13 ENCOUNTER — Ambulatory Visit
Admission: RE | Admit: 2020-02-13 | Discharge: 2020-02-13 | Disposition: A | Discharge status: Home / Self Care | Payer: Medicare Other | Source: Ambulatory Visit | Attending: Family Medicine | Admitting: Family Medicine

## 2020-02-13 DIAGNOSIS — I700 Atherosclerosis of aorta: Secondary | ICD-10-CM | POA: Diagnosis not present

## 2020-02-13 DIAGNOSIS — M549 Dorsalgia, unspecified: Secondary | ICD-10-CM | POA: Diagnosis not present

## 2020-02-13 DIAGNOSIS — N281 Cyst of kidney, acquired: Secondary | ICD-10-CM | POA: Diagnosis not present

## 2020-02-13 DIAGNOSIS — R109 Unspecified abdominal pain: Secondary | ICD-10-CM

## 2020-02-13 DIAGNOSIS — Z9049 Acquired absence of other specified parts of digestive tract: Secondary | ICD-10-CM | POA: Diagnosis not present

## 2020-03-10 DIAGNOSIS — C4441 Basal cell carcinoma of skin of scalp and neck: Secondary | ICD-10-CM | POA: Diagnosis not present

## 2020-04-09 ENCOUNTER — Other Ambulatory Visit: Payer: Self-pay

## 2020-04-09 ENCOUNTER — Ambulatory Visit: Payer: Medicare Other | Admitting: Family Medicine

## 2020-05-06 ENCOUNTER — Other Ambulatory Visit: Payer: Self-pay

## 2020-05-06 ENCOUNTER — Encounter: Payer: Self-pay | Admitting: Orthopedic Surgery

## 2020-05-06 ENCOUNTER — Ambulatory Visit (INDEPENDENT_AMBULATORY_CARE_PROVIDER_SITE_OTHER): Payer: Medicare Other | Admitting: Orthopedic Surgery

## 2020-05-06 ENCOUNTER — Ambulatory Visit
Admission: RE | Admit: 2020-05-06 | Discharge: 2020-05-06 | Disposition: A | Discharge status: Home / Self Care | Payer: Medicare Other | Source: Ambulatory Visit | Attending: Orthopedic Surgery | Admitting: Orthopedic Surgery

## 2020-05-06 ENCOUNTER — Ambulatory Visit: Payer: Self-pay

## 2020-05-06 DIAGNOSIS — S72431A Displaced fracture of medial condyle of right femur, initial encounter for closed fracture: Secondary | ICD-10-CM | POA: Diagnosis not present

## 2020-05-06 DIAGNOSIS — S8991XA Unspecified injury of right lower leg, initial encounter: Secondary | ICD-10-CM

## 2020-05-06 DIAGNOSIS — M25561 Pain in right knee: Secondary | ICD-10-CM | POA: Diagnosis not present

## 2020-05-06 MED ORDER — TRAMADOL HCL 50 MG PO TABS: 50.0000 mg | ORAL_TABLET | Freq: Two times a day (BID) | ORAL | 0 refills | Status: DC | PRN

## 2020-05-06 NOTE — Progress Notes (Signed)
Office Visit Note   Patient: [name] Small           Date of Birth: 1928/08/12           MRN: [id number] Visit Date: 05/06/2020 Requested by: Billie Ruddy, MD [place],  [place] [address] PCP: Billie Ruddy, MD  Subjective: Chief Complaint  Patient presents with  . Right Knee - Pain    HPI: [name] Small is a 86 y.o. female who presents to the office complaining of right knee pain.  Patient states that she had a fall a few days ago.  She describes a mechanical ground-level fall in her kitchen.  She had difficulty getting herself up and had to call her son via her life alert necklace to help her up.  Since then she notes painful weightbearing with most of the pain localized to the right medial knee.  She denies any groin pain or hip pain at all.  She does wake with pain.  She takes Tylenol to help with pain.  She has history of pacemaker and cannot take any NSAIDs.  She feels her symptoms are getting worse..                ROS: All systems reviewed are negative as they relate to the chief complaint within the history of present illness.  Patient denies fevers or chills.  Assessment & Plan: Visit Diagnoses:  1. Right knee injury, initial encounter   2. Right knee pain, unspecified chronicity     Plan: Patient is a 86 year old female who presents following ground-level fall several days ago.  She has worsening right knee pain.  Soft tissue swelling around the medial aspect of the right knee with corresponding radiographic findings suggestive of nondisplaced fracture with cortical disruption along the medial femoral condyle.  She has no hip symptoms by history or on exam today.  Extensor mechanism is intact.  Plan to order CT scan of the right knee to evaluate for occult medial condyle fracture.  Ultram prescribed for symptomatic relief.  Follow-up after CT scan to review results.  Diminished weightbearing is much as possible pending CT scan results  Follow-Up  Instructions: No follow-ups on file.   Orders:  Orders Placed This Encounter  Procedures  . XR KNEE 3 VIEW RIGHT  . CT KNEE RIGHT WO CONTRAST   Meds ordered this encounter  Medications  . traMADol (ULTRAM) 50 MG tablet    Sig: Take 1 tablet (50 mg total) by mouth every 12 (twelve) hours as needed.    Dispense:  30 tablet    Refill:  0      Procedures: No procedures performed   Clinical Data: No additional findings.  Objective: Vital Signs: There were no vitals taken for this visit.  Physical Exam:  Constitutional: Patient appears well-developed HEENT:  Head: Normocephalic Eyes:EOM are normal Neck: Normal range of motion Cardiovascular: Normal rate Pulmonary/chest: Effort normal Neurologic: Patient is alert Skin: Skin is warm Psychiatric: Patient has normal mood and affect  Ortho Exam: Ortho exam demonstrates right knee with no effusion but localized swelling to the medial aspect of the right knee, with particular tenderness over the medial femoral condyle.  This is asymmetric compared with the contralateral side.  Tenderness over the medial joint line.  No tenderness over the lateral joint line or lateral aspect of the knee at all.  Extensor mechanism is intact.  No tenderness over the patella.  No pain with hip range of  motion passively.  Specialty Comments:  No specialty comments available.  Imaging: No results found.   PMFS History: Patient Active Problem List   Diagnosis Date Noted  . Porokeratosis 05/02/2019  . Hav (hallux abducto valgus), unspecified laterality 05/02/2019  . Hyperglycemia 12/07/2014  . Atrial fibrillation with RVR-CHADs VASc=4 12/07/2014  . Intertrochanteric fracture of left hip ([place]) 07/08/2014  . Overactive bladder 04/02/2012  . Essential hypertension 04/02/2012  . Chest pain, atypical 09/17/2011  . Dyslipidemia 11/22/2006  . DIVERTICULOSIS, COLON 11/22/2006  . Osteoporosis 11/22/2006   Past Medical History:  Diagnosis Date  .  Atrial fibrillation ([place])   . Bleeding stomach ulcer 1980s?  [name] Cancer ([place]) Meloanoma left leg  . DIVERTICULOSIS, COLON 11/22/2006  . Headache(784.0)    "very often; not regular" (11/25/2015)  . History of blood transfusion 1980s   "when I had the bleeding ulcer"  . HYPERLIPIDEMIA 11/22/2006  . Hypertension   . Menopausal syndrome (hot flashes)   . OSTEOPOROSIS 11/22/2006  . Presence of permanent cardiac pacemaker     Family History  Problem Relation Age of Onset  . Alzheimer's disease Sister   . Cardiomyopathy Brother     Past Surgical History:  Procedure Laterality Date  . APPENDECTOMY    . BUNIONECTOMY Right   . CATARACT EXTRACTION W/ INTRAOCULAR LENS  IMPLANT, BILATERAL Bilateral 2000s  . CHOLECYSTECTOMY OPEN    . EP IMPLANTABLE DEVICE N/A 11/25/2015   Procedure: Pacemaker Implant;  Surgeon: Will Meredith Leeds, MD;  Location: [place] [facility];  Service: Cardiovascular;  Laterality: N/A;  . FEMUR IM NAIL Left 07/08/2014   Procedure: INTRAMEDULLARY (IM) NAIL ;  Surgeon: Meredith Pel, MD;  Location: [facility];  Service: Orthopedics;  Laterality: Left;  . FRACTURE SURGERY    . REVISION TOTAL HIP ARTHROPLASTY Left 06/2014   Social History   Occupational History    Comment: retired  Tobacco Use  . Smoking status: Never Smoker  . Smokeless tobacco: Never Used  Substance and Sexual Activity  . Alcohol use: No    Alcohol/week: 0.0 standard drinks  . Drug use: No  . Sexual activity: Never

## 2020-05-09 ENCOUNTER — Encounter: Payer: Self-pay | Admitting: Orthopedic Surgery

## 2020-05-10 DIAGNOSIS — L249 Irritant contact dermatitis, unspecified cause: Secondary | ICD-10-CM | POA: Diagnosis not present

## 2020-05-10 DIAGNOSIS — L905 Scar conditions and fibrosis of skin: Secondary | ICD-10-CM | POA: Diagnosis not present

## 2020-05-10 DIAGNOSIS — Z85828 Personal history of other malignant neoplasm of skin: Secondary | ICD-10-CM | POA: Diagnosis not present

## 2020-05-12 NOTE — Progress Notes (Addendum)
Subjective:   [name] Small is a 86 y.o. female who presents for Medicare Annual (Subsequent) preventive examination.  Review of Systems    N/A  Cardiac Risk Factors include: advanced age (>71men, >24 women);hypertension;dyslipidemia     Objective:    Today's Vitals   05/13/20 0942  BP: 120/80  Pulse: 74  Temp: 97.7 F (36.5 C)  TempSrc: Oral  SpO2: 98%  Weight: 112 lb 7 oz (51 kg)  Height: 5\' 5"  (1.651 m)   Body mass index is 18.71 kg/m.  Advanced Directives 05/13/2020 08/23/2016 03/06/2016 01/11/2016 12/27/2015 11/25/2015 11/24/2015  Does Patient Have a Medical Advance Directive? Yes No No No No No No  Type of Paramedic of [place];Living will - - - - - -  Copy of [place] in Chart? No - copy requested - - - - - -  Would patient like information on creating a medical advance directive? - No - Patient declined No - patient declined information No - patient declined information No - patient declined information No - patient declined information Yes - Educational materials given  Pre-existing out of facility DNR order (yellow form or pink MOST form) - - - - - - -    Current Medications (verified) Outpatient Encounter Medications as of 05/13/2020  Medication Sig  . acetaminophen (TYLENOL) 500 MG tablet Take 500 mg by mouth every 6 (six) hours as needed for mild pain or headache.   [name] amiodarone (PACERONE) 200 MG tablet TAKE 1 TABLET(200 MG) BY MOUTH DAILY  . atorvastatin (LIPITOR) 40 MG tablet TAKE 1 TABLET(40 MG) BY MOUTH DAILY  . Calcium Carbonate-Vitamin D (CALTRATE 600+D PO) Take 1 tablet by mouth daily.    . diclofenac sodium (VOLTAREN) 1 % GEL Apply 4 g topically 4 (four) times daily as needed.  [name] losartan (COZAAR) 50 MG tablet Take 1 tablet (50 mg total) by mouth daily.  . metoprolol tartrate (LOPRESSOR) 25 MG tablet Take 1 tablet (25 mg total) by mouth 2 (two) times daily.  . Polyvinyl Alcohol-Povidone (REFRESH OP) Place 1 drop into  both eyes 2 (two) times daily.  . Rivaroxaban (XARELTO) 15 MG TABS tablet TAKE 1 TABLET(15 MG) BY MOUTH DAILY WITH DINNER  . traMADol (ULTRAM) 50 MG tablet Take 1 tablet (50 mg total) by mouth every 12 (twelve) hours as needed.   No facility-administered encounter medications on file as of 05/13/2020.    Allergies (verified) Septra ds [sulfamethoxazole-trimethoprim] and Lisinopril   History: Past Medical History:  Diagnosis Date  . Atrial fibrillation ([place])   . Bleeding stomach ulcer 1980s?  [name] Cancer ([place]) Meloanoma left leg  . DIVERTICULOSIS, COLON 11/22/2006  . Headache(784.0)    "very often; not regular" (11/25/2015)  . History of blood transfusion 1980s   "when I had the bleeding ulcer"  . HYPERLIPIDEMIA 11/22/2006  . Hypertension   . Menopausal syndrome (hot flashes)   . OSTEOPOROSIS 11/22/2006  . Presence of permanent cardiac pacemaker    Past Surgical History:  Procedure Laterality Date  . APPENDECTOMY    . BUNIONECTOMY Right   . CATARACT EXTRACTION W/ INTRAOCULAR LENS  IMPLANT, BILATERAL Bilateral 2000s  . CHOLECYSTECTOMY OPEN    . EP IMPLANTABLE DEVICE N/A 11/25/2015   Procedure: Pacemaker Implant;  Surgeon: Will Meredith Leeds, MD;  Location: [facility];  Service: Cardiovascular;  Laterality: N/A;  . FEMUR IM NAIL Left 07/08/2014   Procedure: INTRAMEDULLARY (IM) NAIL ;  Surgeon: Meredith Pel, MD;  Location: [facility];  Service: Orthopedics;  Laterality: Left;  . FRACTURE SURGERY    . REVISION TOTAL HIP ARTHROPLASTY Left 06/2014   Family History  Problem Relation Age of Onset  . Alzheimer's disease Sister   . Cardiomyopathy Brother    Social History   Socioeconomic History  . Marital status: Widowed    Spouse name: Not on file  . Number of children: 2  . Years of education: 36  . Highest education level: Not on file  Occupational History    Comment: retired  Tobacco Use  . Smoking status: Never Smoker  . Smokeless tobacco: Never Used  Substance and  Sexual Activity  . Alcohol use: No    Alcohol/week: 0.0 standard drinks  . Drug use: No  . Sexual activity: Never  Other Topics Concern  . Not on file  Social History Narrative   Lives alone, son lives beside her   Caffeine - none   Social Determinants of Health   Financial Resource Strain: Low Risk   . Difficulty of Paying Living Expenses: Not hard at all  Food Insecurity: No Food Insecurity  . Worried About Programme researcher, broadcasting/film/video in the Last Year: Never true  . Ran Out of Food in the Last Year: Never true  Transportation Needs: No Transportation Needs  . Lack of Transportation (Medical): No  . Lack of Transportation (Non-Medical): No  Physical Activity: Inactive  . Days of Exercise per Week: 0 days  . Minutes of Exercise per Session: 0 min  Stress: No Stress Concern Present  . Feeling of Stress : Not at all  Social Connections: Socially Isolated  . Frequency of Communication with Friends and Family: More than three times a week  . Frequency of Social Gatherings with Friends and Family: More than three times a week  . Attends Religious Services: Never  . Active Member of Clubs or Organizations: No  . Attends Banker Meetings: Never  . Marital Status: Widowed    Tobacco Counseling Counseling given: Not Answered   Clinical Intake:  Pre-visit preparation completed: Yes  Pain : No/denies pain     Nutritional Risks: None Diabetes: No  How often do you need to have someone help you when you read instructions, pamphlets, or other written materials from your doctor or pharmacy?: 1 - Never What is the last grade level you completed in school?: 12th grade  Diabetic?No   Interpreter Needed?: No  Information entered by :: SCrews,LPN   Activities of Daily Living In your present state of health, do you have any difficulty performing the following activities: 05/13/2020  Hearing? N  Vision? N  Difficulty concentrating or making decisions? Y  Walking or  climbing stairs? N  Dressing or bathing? N  Doing errands, shopping? N  Preparing Food and eating ? N  Using the Toilet? N  In the past six months, have you accidently leaked urine? N  Do you have problems with loss of bowel control? N  Managing your Medications? N  Managing your Finances? N  Housekeeping or managing your Housekeeping? N  Some recent data might be hidden    Patient Care Team: Deeann Saint, MD as PCP - General (Family Medicine) Wendall Stade, MD as PCP - Cardiology (Cardiology) Regan Lemming, MD as PCP - Electrophysiology (Cardiology)  Indicate any recent Medical Services you may have received from other than [facility] providers in the past year (date may be approximate).     Assessment:  This is a routine wellness examination for Cherri.  Hearing/Vision screen  Hearing Screening   125Hz  250Hz  500Hz  1000Hz  2000Hz  3000Hz  4000Hz  6000Hz  8000Hz   Right ear:           Left ear:           Vision Screening Comments: Patient states gets eyes examined once a day   Dietary issues and exercise activities discussed: Current Exercise Habits: The patient does not participate in regular exercise at present  Goals    . Exercise 3x per week (30 min per time)      Depression Screen PHQ 2/9 Scores 05/13/2020 10/31/2016 05/02/2016 10/26/2015 04/07/2014 04/03/2013  PHQ - 2 Score 0 0 0 0 0 0  PHQ- 9 Score 0 - - - - -    Fall Risk Fall Risk  05/13/2020 10/31/2016 05/02/2016 01/04/2016 10/26/2015  Falls in the past year? 1 Yes No No No  Number falls in past yr: 0 1 - - -  Injury with Fall? 0 Yes - - -  Risk Factor Category  - High Fall Risk - - -  Risk for fall due to : Impaired balance/gait;Impaired mobility;History of fall(s) - - - -  Follow up Falls evaluation completed;Falls prevention discussed Education provided;Falls prevention discussed - - -    FALL RISK PREVENTION PERTAINING TO THE HOME:  Any stairs in or around the home? Yes  If so, are there any without  handrails? No  Home free of loose throw rugs in walkways, pet beds, electrical cords, etc? Yes  Adequate lighting in your home to reduce risk of falls? Yes   ASSISTIVE DEVICES UTILIZED TO PREVENT FALLS:  Life alert? Yes  Use of a cane, walker or w/c? Yes  Grab bars in the bathroom? No  Shower chair or bench in shower? Yes  Elevated toilet seat or a handicapped toilet? Yes   TIMED UP AND GO:  Was the test performed? Yes .  Length of time to ambulate 10 feet: 10 sec.   Gait slow and steady with assistive device  Cognitive Function:     6CIT Screen 05/13/2020  What Year? 0 points  What month? 0 points  What time? 0 points  Count back from 20 0 points  Months in reverse 4 points  Repeat phrase 2 points  Total Score 6    Immunizations Immunization History  Administered Date(s) Administered  . Fluad Quad(high Dose 65+) 03/05/2019  . Influenza Split 03/02/2011, 03/14/2012  . Influenza Whole 03/21/2007, 02/24/2008, 02/18/2009, 01/28/2010  . Influenza, High Dose Seasonal PF 04/03/2013, 03/10/2014, 03/30/2015, 02/29/2016, 02/06/2017, 02/27/2018  . PFIZER SARS-COV-2 Vaccination 07/04/2019, 07/27/2019  . Pneumococcal Conjugate-13 04/07/2014  . Pneumococcal Polysaccharide-23 10/18/2010  . Tdap 10/18/2010    TDAP status: Up to date  Flu Vaccine status: Up to date  Pneumococcal vaccine status: Up to date  Covid-19 vaccine status: Completed vaccines  Qualifies for Shingles Vaccine? Yes   Zostavax completed No   Shingrix Completed?: No.    Education has been provided regarding the importance of this vaccine. Patient has been advised to call insurance company to determine out of pocket expense if they have not yet received this vaccine. Advised may also receive vaccine at local pharmacy or Health Dept. Verbalized acceptance and understanding.  Screening Tests Health Maintenance  Topic Date Due  . MAMMOGRAM  11/27/2018  . INFLUENZA VACCINE  12/21/2019  . COVID-19 Vaccine (3  - Booster for Pfizer series) 01/27/2020  . TETANUS/TDAP  10/17/2020  . DEXA SCAN  Completed  . PNA vac Low Risk Adult  Completed    Health Maintenance  Health Maintenance Due  Topic Date Due  . MAMMOGRAM  11/27/2018  . INFLUENZA VACCINE  12/21/2019  . COVID-19 Vaccine (3 - Booster for Pfizer series) 01/27/2020    Colorectal cancer screening: No longer required.   Mammogram status: No longer required due to age.  Bone Density status: Completed 10/27/2014. Results reflect: Bone density results: OSTEOPOROSIS. Repeat every 2 years.  Lung Cancer Screening: (Low Dose CT Chest recommended if Age 64-80 years, 30 pack-year currently smoking OR have quit w/in 15years.) does not qualify.   Lung Cancer Screening Referral: N/a   Additional Screening:  Hepatitis C Screening: does not qualify;   Vision Screening: Recommended annual ophthalmology exams for early detection of glaucoma and other disorders of the eye. Is the patient up to date with their annual eye exam?  Yes  Who is the provider or what is the name of the office in which the patient attends annual eye exams? Unsure of eye doctors name states doctor is on [address]  If pt is not established with a provider, would they like to be referred to a provider to establish care? No .   Dental Screening: Recommended annual dental exams for proper oral hygiene  Community Resource Referral / Chronic Care Management: CRR required this visit?  No   CCM required this visit?  No      Plan:     I have personally reviewed and noted the following in the patient's chart:   . Medical and social history . Use of alcohol, tobacco or illicit drugs  . Current medications and supplements . Functional ability and status . Nutritional status . Physical activity . Advanced directives . List of other physicians . Hospitalizations, surgeries, and ER visits in previous 12 months . Vitals . Screenings to include cognitive, depression,  and falls . Referrals and appointments  In addition, I have reviewed and discussed with patient certain preventive protocols, quality metrics, and best practice recommendations. A written personalized care plan for preventive services as well as general preventive health recommendations were provided to patient.     Ofilia Neas, LPN   [phone]   Nurse Notes: None

## 2020-05-13 ENCOUNTER — Other Ambulatory Visit: Payer: Self-pay

## 2020-05-13 ENCOUNTER — Ambulatory Visit (INDEPENDENT_AMBULATORY_CARE_PROVIDER_SITE_OTHER): Payer: Medicare Other

## 2020-05-13 VITALS — BP 120/80 | HR 74 | Temp 97.7°F | Ht 65.0 in | Wt 112.4 lb

## 2020-05-13 DIAGNOSIS — Z0000 Encounter for general adult medical examination without abnormal findings: Secondary | ICD-10-CM | POA: Diagnosis not present

## 2020-05-13 NOTE — Patient Instructions (Addendum)
[name] Small , Thank you for taking time to come for your Medicare Wellness Visit. I appreciate your ongoing commitment to your health goals. Please review the following plan we discussed and let me know if I can assist you in the future.   Screening recommendations/referrals: Colonoscopy: No longer required  Mammogram: No longer required  Bone Density: No longer required  Recommended yearly ophthalmology/optometry visit for glaucoma screening and checkup Recommended yearly dental visit for hygiene and checkup  Vaccinations: Influenza vaccine: Up to date, next due fall 2022  Pneumococcal vaccine: Completed series  Tdap vaccine: Up to date, next due 10/17/2020 Shingles vaccine: Currently due for Shingrix, if you wish to receive we recommend that you do so at your local pharmacy     Advanced directives: Please bring a copy of your advanced medical directives into our office so that we may scan them into your chart  Conditions/risks identified: None   Next appointment: 05/24/2021 @ 9:45 am with [place] 65 Years and Older, Female Preventive care refers to lifestyle choices and visits with your health care provider that can promote health and wellness. What does preventive care include?  A yearly physical exam. This is also called an annual well check.  Dental exams once or twice a year.  Routine eye exams. Ask your health care provider how often you should have your eyes checked.  Personal lifestyle choices, including:  Daily care of your teeth and gums.  Regular physical activity.  Eating a healthy diet.  Avoiding tobacco and drug use.  Limiting alcohol use.  Practicing safe sex.  Taking low-dose aspirin every day.  Taking vitamin and mineral supplements as recommended by your health care provider. What happens during an annual well check? The services and screenings done by your health care provider during your annual well check  will depend on your age, overall health, lifestyle risk factors, and family history of disease. Counseling  Your health care provider may ask you questions about your:  Alcohol use.  Tobacco use.  Drug use.  Emotional well-being.  Home and relationship well-being.  Sexual activity.  Eating habits.  History of falls.  Memory and ability to understand (cognition).  Work and work Statistician.  Reproductive health. Screening  You may have the following tests or measurements:  Height, weight, and BMI.  Blood pressure.  Lipid and cholesterol levels. These may be checked every 5 years, or more frequently if you are over 70 years old.  Skin check.  Lung cancer screening. You may have this screening every year starting at age 49 if you have a 30-pack-year history of smoking and currently smoke or have quit within the past 15 years.  Fecal occult blood test (FOBT) of the stool. You may have this test every year starting at age 44.  Flexible sigmoidoscopy or colonoscopy. You may have a sigmoidoscopy every 5 years or a colonoscopy every 10 years starting at age 1.  Hepatitis C blood test.  Hepatitis B blood test.  Sexually transmitted disease (STD) testing.  Diabetes screening. This is done by checking your blood sugar (glucose) after you have not eaten for a while (fasting). You may have this done every 1-3 years.  Bone density scan. This is done to screen for osteoporosis. You may have this done starting at age 21.  Mammogram. This may be done every 1-2 years. Talk to your health care provider about how often you should have regular mammograms. Talk with your  health care provider about your test results, treatment options, and if necessary, the need for more tests. Vaccines  Your health care provider may recommend certain vaccines, such as:  Influenza vaccine. This is recommended every year.  Tetanus, diphtheria, and acellular pertussis (Tdap, Td) vaccine. You may  need a Td booster every 10 years.  Zoster vaccine. You may need this after age 7.  Pneumococcal 13-valent conjugate (PCV13) vaccine. One dose is recommended after age 77.  Pneumococcal polysaccharide (PPSV23) vaccine. One dose is recommended after age 95. Talk to your health care provider about which screenings and vaccines you need and how often you need them. This information is not intended to replace advice given to you by your health care provider. Make sure you discuss any questions you have with your health care provider. Document Released: 06/04/2015 Document Revised: 01/26/2016 Document Reviewed: 03/09/2015 Elsevier Interactive Patient Education  2017 [place] Prevention in the Home Falls can cause injuries. They can happen to people of all ages. There are many things you can do to make your home safe and to help prevent falls. What can I do on the outside of my home?  Regularly fix the edges of walkways and driveways and fix any cracks.  Remove anything that might make you trip as you walk through a door, such as a raised step or threshold.  Trim any bushes or trees on the path to your home.  Use bright outdoor lighting.  Clear any walking paths of anything that might make someone trip, such as rocks or tools.  Regularly check to see if handrails are loose or broken. Make sure that both sides of any steps have handrails.  Any raised decks and porches should have guardrails on the edges.  Have any leaves, snow, or ice cleared regularly.  Use sand or salt on walking paths during winter.  Clean up any spills in your garage right away. This includes oil or grease spills. What can I do in the bathroom?  Use night lights.  Install grab bars by the toilet and in the tub and shower. Do not use towel bars as grab bars.  Use non-skid mats or decals in the tub or shower.  If you need to sit down in the shower, use a plastic, non-slip stool.  Keep the floor  dry. Clean up any water that spills on the floor as soon as it happens.  Remove soap buildup in the tub or shower regularly.  Attach bath mats securely with double-sided non-slip rug tape.  Do not have throw rugs and other things on the floor that can make you trip. What can I do in the bedroom?  Use night lights.  Make sure that you have a light by your bed that is easy to reach.  Do not use any sheets or blankets that are too big for your bed. They should not hang down onto the floor.  Have a firm chair that has side arms. You can use this for support while you get dressed.  Do not have throw rugs and other things on the floor that can make you trip. What can I do in the kitchen?  Clean up any spills right away.  Avoid walking on wet floors.  Keep items that you use a lot in easy-to-reach places.  If you need to reach something above you, use a strong step stool that has a grab bar.  Keep electrical cords out of the way.  Do not use  floor polish or wax that makes floors slippery. If you must use wax, use non-skid floor wax.  Do not have throw rugs and other things on the floor that can make you trip. What can I do with my stairs?  Do not leave any items on the stairs.  Make sure that there are handrails on both sides of the stairs and use them. Fix handrails that are broken or loose. Make sure that handrails are as long as the stairways.  Check any carpeting to make sure that it is firmly attached to the stairs. Fix any carpet that is loose or worn.  Avoid having throw rugs at the top or bottom of the stairs. If you do have throw rugs, attach them to the floor with carpet tape.  Make sure that you have a light switch at the top of the stairs and the bottom of the stairs. If you do not have them, ask someone to add them for you. What else can I do to help prevent falls?  Wear shoes that:  Do not have high heels.  Have rubber bottoms.  Are comfortable and fit you  well.  Are closed at the toe. Do not wear sandals.  If you use a stepladder:  Make sure that it is fully opened. Do not climb a closed stepladder.  Make sure that both sides of the stepladder are locked into place.  Ask someone to hold it for you, if possible.  Clearly mark and make sure that you can see:  Any grab bars or handrails.  First and last steps.  Where the edge of each step is.  Use tools that help you move around (mobility aids) if they are needed. These include:  Canes.  Walkers.  Scooters.  Crutches.  Turn on the lights when you go into a dark area. Replace any light bulbs as soon as they burn out.  Set up your furniture so you have a clear path. Avoid moving your furniture around.  If any of your floors are uneven, fix them.  If there are any pets around you, be aware of where they are.  Review your medicines with your doctor. Some medicines can make you feel dizzy. This can increase your chance of falling. Ask your doctor what other things that you can do to help prevent falls. This information is not intended to replace advice given to you by your health care provider. Make sure you discuss any questions you have with your health care provider. Document Released: 03/04/2009 Document Revised: 10/14/2015 Document Reviewed: 06/12/2014 Elsevier Interactive Patient Education  2017 [employer].

## 2020-05-29 ENCOUNTER — Other Ambulatory Visit: Payer: Self-pay | Admitting: Family Medicine

## 2020-07-26 ENCOUNTER — Ambulatory Visit (INDEPENDENT_AMBULATORY_CARE_PROVIDER_SITE_OTHER): Payer: Medicare Other

## 2020-07-26 DIAGNOSIS — I495 Sick sinus syndrome: Secondary | ICD-10-CM | POA: Diagnosis not present

## 2020-07-27 LAB — CUP PACEART REMOTE DEVICE CHECK
Battery Remaining Longevity: 74 mo
Battery Voltage: 3.01 V
Brady Statistic AP VP Percent: 0.07 %
Brady Statistic AP VS Percent: 96.01 %
Brady Statistic AS VP Percent: 0.01 %
Brady Statistic AS VS Percent: 3.91 %
Brady Statistic RA Percent Paced: 96.05 %
Brady Statistic RV Percent Paced: 0.07 %
Date Time Interrogation Session: 20220307102522
Implantable Lead Implant Date: 20170706
Implantable Lead Implant Date: 20170706
Implantable Lead Location: 753859
Implantable Lead Location: 753860
Implantable Lead Model: 5076
Implantable Lead Model: 5076
Implantable Pulse Generator Implant Date: 20170706
Lead Channel Impedance Value: 342 Ohm
Lead Channel Impedance Value: 418 Ohm
Lead Channel Impedance Value: 494 Ohm
Lead Channel Impedance Value: 646 Ohm
Lead Channel Pacing Threshold Amplitude: 0.625 V
Lead Channel Pacing Threshold Amplitude: 1.125 V
Lead Channel Pacing Threshold Pulse Width: 0.4 ms
Lead Channel Pacing Threshold Pulse Width: 0.4 ms
Lead Channel Sensing Intrinsic Amplitude: 1.125 mV
Lead Channel Sensing Intrinsic Amplitude: 1.125 mV
Lead Channel Sensing Intrinsic Amplitude: 17.375 mV
Lead Channel Sensing Intrinsic Amplitude: 17.375 mV
Lead Channel Setting Pacing Amplitude: 1.5 V
Lead Channel Setting Pacing Amplitude: 2.5 V
Lead Channel Setting Pacing Pulse Width: 0.4 ms
Lead Channel Setting Sensing Sensitivity: 2.8 mV

## 2020-08-04 NOTE — Progress Notes (Signed)
Remote pacemaker transmission.   

## 2020-08-09 ENCOUNTER — Other Ambulatory Visit: Payer: Self-pay | Admitting: Family Medicine

## 2020-09-13 ENCOUNTER — Other Ambulatory Visit: Payer: Self-pay

## 2020-09-13 ENCOUNTER — Encounter: Payer: Self-pay | Admitting: Cardiology

## 2020-09-13 ENCOUNTER — Ambulatory Visit: Payer: Medicare Other | Admitting: Cardiology

## 2020-09-13 VITALS — BP 116/82 | HR 63 | Ht 65.0 in | Wt 116.0 lb

## 2020-09-13 DIAGNOSIS — I495 Sick sinus syndrome: Secondary | ICD-10-CM | POA: Diagnosis not present

## 2020-09-13 DIAGNOSIS — Z79899 Other long term (current) drug therapy: Secondary | ICD-10-CM | POA: Diagnosis not present

## 2020-09-13 DIAGNOSIS — I480 Paroxysmal atrial fibrillation: Secondary | ICD-10-CM

## 2020-09-13 NOTE — Patient Instructions (Addendum)
Medication Instructions:  Your physician recommends that you continue on your current medications as directed. Please refer to the Current Medication list given to you today.  *If you need a refill on your cardiac medications before your next appointment, please call your pharmacy*   Lab Work: Today: TSH & LFT   Testing/Procedures: None ordered   Follow-Up: At [facility], you and your health needs are our priority.  As part of our continuing mission to provide you with exceptional heart care, we have created designated Provider Care Teams.  These Care Teams include your primary Cardiologist (physician) and Advanced Practice Providers (APPs -  Physician Assistants and Nurse Practitioners) who all work together to provide you with the care you need, when you need it.  We recommend signing up for the patient portal called "MyChart".  Sign up information is provided on this After Visit Summary.  MyChart is used to connect with patients for Virtual Visits (Telemedicine).  Patients are able to view lab/test results, encounter notes, upcoming appointments, etc.  Non-urgent messages can be sent to your provider as well.   To learn more about what you can do with MyChart, go to [email or web address].    Remote monitoring is used to monitor your Pacemaker or ICD from home. This monitoring reduces the number of office visits required to check your device to one time per year. It allows [place] to keep an eye on the functioning of your device to ensure it is working properly. You are scheduled for a device check from home on 10/25/2020. You may send your transmission at any time that day. If you have a wireless device, the transmission will be sent automatically. After your physician reviews your transmission, you will receive a postcard with your next transmission date.  Your next appointment:   6 month(s)  The format for your next appointment:   In Person  Provider:   You will see one of the  following Advanced Practice Providers on your designated Care Team:    Chanetta Marshall, NP  Tommye Standard, PA-C  Legrand Como "[place]" [place], [place]  Then, Will Meredith Leeds, MD will plan to see you again in 1 year(s).   Thank you for choosing [facility]!!   Trinidad Curet, RN [phone]    Other Instructions

## 2020-09-13 NOTE — Progress Notes (Signed)
Electrophysiology Office Note   Date:  09/13/2020   ID:  [name] Small, DOB 1928/06/10, MRN [id number]  PCP:  Billie Ruddy, MD  Primary Electrophysiologist: Constance Haw, MD    No chief complaint on file.    History of Present Illness: [name] Small is a 87 y.o. female who presents today for electrophysiology evaluation.     She has a history of paroxysmal atrial fibrillation, diagnosed in 2018.  She has a history of falls in 2016 with a hip fracture.  She is currently on Coumadin.  She was having issues with bradycardia.  She went to the emergency room with an episode of atrial fibrillation with rapid rates feeling weak and sweaty.  She is status post Medtronic dual-chamber pacemaker implanted 11/25/2015.  Today, denies symptoms of palpitations, chest pain, shortness of breath, orthopnea, PND, lower extremity edema, claudication, dizziness, presyncope, syncope, bleeding, or neurologic sequela. The patient is tolerating medications without difficulties.  Since being seen she has done well.  She has no chest pain or shortness of breath.  She is able to do all of her daily activities without restriction.   Past Medical History:  Diagnosis Date  . Atrial fibrillation ([place])   . Bleeding stomach ulcer 1980s?  [name] Cancer ([place]) Meloanoma left leg  . DIVERTICULOSIS, COLON 11/22/2006  . Headache(784.0)    "very often; not regular" (11/25/2015)  . History of blood transfusion 1980s   "when I had the bleeding ulcer"  . HYPERLIPIDEMIA 11/22/2006  . Hypertension   . Menopausal syndrome (hot flashes)   . OSTEOPOROSIS 11/22/2006  . Presence of permanent cardiac pacemaker    Past Surgical History:  Procedure Laterality Date  . APPENDECTOMY    . BUNIONECTOMY Right   . CATARACT EXTRACTION W/ INTRAOCULAR LENS  IMPLANT, BILATERAL Bilateral 2000s  . CHOLECYSTECTOMY OPEN    . EP IMPLANTABLE DEVICE N/A 11/25/2015   Procedure: Pacemaker Implant;  Surgeon: Zohra Clavel Meredith Leeds, MD;  Location: [facility];  Service: Cardiovascular;  Laterality: N/A;  . FEMUR IM NAIL Left 07/08/2014   Procedure: INTRAMEDULLARY (IM) NAIL ;  Surgeon: Meredith Pel, MD;  Location: [facility];  Service: Orthopedics;  Laterality: Left;  . FRACTURE SURGERY    . REVISION TOTAL HIP ARTHROPLASTY Left 06/2014     Current Outpatient Medications  Medication Sig Dispense Refill  . acetaminophen (TYLENOL) 500 MG tablet Take 500 mg by mouth every 6 (six) hours as needed for mild pain or headache.     [name] amiodarone (PACERONE) 200 MG tablet TAKE 1 TABLET(200 MG) BY MOUTH DAILY 90 tablet 2  . atorvastatin (LIPITOR) 40 MG tablet TAKE 1 TABLET(40 MG) BY MOUTH DAILY 30 tablet 0  . Calcium Carbonate-Vitamin D (CALTRATE 600+D PO) Take 1 tablet by mouth daily.      . diclofenac sodium (VOLTAREN) 1 % GEL Apply 4 g topically 4 (four) times daily as needed. 500 g 6  . losartan (COZAAR) 50 MG tablet Take 1 tablet (50 mg total) by mouth daily. 90 tablet 3  . metoprolol tartrate (LOPRESSOR) 25 MG tablet Take 1 tablet (25 mg total) by mouth 2 (two) times daily. 60 tablet 11  . Polyvinyl Alcohol-Povidone (REFRESH OP) Place 1 drop into both eyes 2 (two) times daily.    . Rivaroxaban (XARELTO) 15 MG TABS tablet TAKE 1 TABLET(15 MG) BY MOUTH DAILY WITH DINNER 30 tablet 5  . traMADol (ULTRAM) 50 MG tablet Take 1 tablet (50 mg total) by mouth every  12 (twelve) hours as needed. 30 tablet 0   No current facility-administered medications for this visit.    Allergies:   Septra ds [sulfamethoxazole-trimethoprim] and Lisinopril   Social History:  The patient  reports that she has never smoked. She has never used smokeless tobacco. She reports that she does not drink alcohol and does not use drugs.   Family History:  The patient's family history includes Alzheimer's disease in her sister; Cardiomyopathy in her brother.   ROS:  Please see the history of present illness.   Otherwise, review of systems is positive for none.   All other systems  are reviewed and negative.   PHYSICAL EXAM: VS:  BP 116/82   Pulse 63   Ht 5\' 5"  (1.651 m)   Wt 116 lb (52.6 kg)   BMI 19.30 kg/m  , BMI Body mass index is 19.3 kg/m. GEN: Well nourished, well developed, in no acute distress  HEENT: normal  Neck: no JVD, carotid bruits, or masses Cardiac: RRR; no murmurs, rubs, or gallops,no edema  Respiratory:  clear to auscultation bilaterally, normal work of breathing GI: soft, nontender, nondistended, + BS MS: no deformity or atrophy  Skin: warm and dry, device site well healed Neuro:  Strength and sensation are intact Psych: euthymic mood, full affect  EKG:  EKG is ordered today. Personal review of the ekg ordered shows atrial paced, rate 63  Personal review of the device interrogation today. Results in [facility]: 09/26/2019: ALT 12; BUN 16; Creatinine, Ser 1.14; Hemoglobin 13.9; Platelets 192; Potassium 4.3; Sodium 141; TSH 3.080    Lipid Panel     Component Value Date/Time   CHOL 163 08/01/2017 1047   TRIG 67.0 08/01/2017 1047   HDL 75.50 08/01/2017 1047   CHOLHDL 2 08/01/2017 1047   VLDL 13.4 08/01/2017 1047   LDLCALC 74 08/01/2017 1047   LDLDIRECT 176.0 02/24/2008 1424     Wt Readings from Last 3 Encounters:  09/13/20 116 lb (52.6 kg)  05/13/20 112 lb 7 oz (51 kg)  02/02/20 116 lb 1.6 oz (52.7 kg)      Other studies Reviewed: Additional studies/ records that were reviewed today include: Holter 03/02/15  Review of the above records today demonstrates: Sinus rhythm sinus bradycardia PVC;s with slow bigeminny. If PVC;s not perfused effective HR as low as 24 bpm PAC;s and short bursts of atrial tachycardia less than 10 beats Average HR 54 bpm   TTE - Normal LV function; elevated LV filling pressure; mild LAE; traceAI, MR and TR.  ASSESSMENT AND PLAN:  1.  Paroxysmal atrial fibrillation: Currently on Xarelto and amiodarone.  High risk medication monitoring.  CHA2DS2-VASc of 4.  We Shelisa Fern check amiodarone  labs today.  2.  Sick sinus syndrome: Status post Medtronic dual-chamber pacemaker implanted 11/25/2015.  Device functioning appropriately.  No changes.    3.  Hypertension: Currently well controlled   4.  Hyperlipidemia: Currently on a statin.  Followed by PCP.  Current medicines are reviewed at length with the patient today.   The patient does not have concerns regarding her medicines.  The following changes were made today: none  Labs/ tests ordered today include: none  Orders Placed This Encounter  Procedures  . TSH  . Hepatic function panel  . EKG 12-Lead     Disposition:   FU with Dywane Peruski 6 months  Signed, Jai Steil Meredith Leeds, MD  09/13/2020 3:53 PM     [facility]  [address] [address] [phone] (office) [phone] (fax)

## 2020-09-14 LAB — HEPATIC FUNCTION PANEL
ALT: 13 IU/L (ref 0–32)
AST: 21 IU/L (ref 0–40)
Albumin: 4.7 g/dL — ABNORMAL HIGH (ref 3.6–4.6)
Alkaline Phosphatase: 91 IU/L (ref 44–121)
Bilirubin Total: 0.6 mg/dL (ref 0.0–1.2)
Bilirubin, Direct: 0.22 mg/dL (ref 0.00–0.40)
Total Protein: 7.4 g/dL (ref 6.0–8.5)

## 2020-09-14 LAB — TSH: TSH: 3.35 u[IU]/mL (ref 0.450–4.500)

## 2020-09-26 ENCOUNTER — Other Ambulatory Visit: Payer: Self-pay | Admitting: Cardiovascular Disease

## 2020-09-27 NOTE — Telephone Encounter (Signed)
Pt's age 87, wt 52.6 kg, SCr 1.16, CrCl 27.3, last ov w/ WC 09/13/20.

## 2020-10-25 ENCOUNTER — Ambulatory Visit (INDEPENDENT_AMBULATORY_CARE_PROVIDER_SITE_OTHER): Payer: Medicare Other

## 2020-10-25 DIAGNOSIS — I495 Sick sinus syndrome: Secondary | ICD-10-CM | POA: Diagnosis not present

## 2020-10-27 LAB — CUP PACEART REMOTE DEVICE CHECK
Battery Remaining Longevity: 69 mo
Battery Voltage: 3 V
Brady Statistic AP VP Percent: 0.08 %
Brady Statistic AP VS Percent: 97.04 %
Brady Statistic AS VP Percent: 0.01 %
Brady Statistic AS VS Percent: 2.87 %
Brady Statistic RA Percent Paced: 97.1 %
Brady Statistic RV Percent Paced: 0.08 %
Date Time Interrogation Session: 20220607132810
Implantable Lead Implant Date: 20170706
Implantable Lead Implant Date: 20170706
Implantable Lead Location: 753859
Implantable Lead Location: 753860
Implantable Lead Model: 5076
Implantable Lead Model: 5076
Implantable Pulse Generator Implant Date: 20170706
Lead Channel Impedance Value: 342 Ohm
Lead Channel Impedance Value: 418 Ohm
Lead Channel Impedance Value: 456 Ohm
Lead Channel Impedance Value: 589 Ohm
Lead Channel Pacing Threshold Amplitude: 0.75 V
Lead Channel Pacing Threshold Amplitude: 1.125 V
Lead Channel Pacing Threshold Pulse Width: 0.4 ms
Lead Channel Pacing Threshold Pulse Width: 0.4 ms
Lead Channel Sensing Intrinsic Amplitude: 1.625 mV
Lead Channel Sensing Intrinsic Amplitude: 1.625 mV
Lead Channel Sensing Intrinsic Amplitude: 18.75 mV
Lead Channel Sensing Intrinsic Amplitude: 18.75 mV
Lead Channel Setting Pacing Amplitude: 1.5 V
Lead Channel Setting Pacing Amplitude: 2.5 V
Lead Channel Setting Pacing Pulse Width: 0.4 ms
Lead Channel Setting Sensing Sensitivity: 2.8 mV

## 2020-11-13 ENCOUNTER — Other Ambulatory Visit: Payer: Self-pay | Admitting: Cardiovascular Disease

## 2020-11-15 NOTE — Telephone Encounter (Addendum)
Xarelto 15mg  refill request received. Pt is 87 years old, weight-52.6kg, Crea-1.16 on 01/31/2020 via [facility] from [facility], last seen by Dr. Curt Bears on 09/13/2020, Diagnosis-Afib, CrCl-27.47ml/min; Dose is appropriate based on dosing criteria.  Refill was sent on 09/27/20 with 30 tabs and 5 refills, will call to confirmed if received or not before resending.   Called the pharmacy and confirmed they have the refill and they will process at this time, she is aware I will deny this one since this is a duplicate.

## 2020-11-16 NOTE — Progress Notes (Signed)
Remote pacemaker transmission.   

## 2020-12-08 ENCOUNTER — Other Ambulatory Visit: Payer: Self-pay | Admitting: Family Medicine

## 2021-02-05 ENCOUNTER — Other Ambulatory Visit: Payer: Self-pay | Admitting: Cardiovascular Disease

## 2021-02-10 ENCOUNTER — Other Ambulatory Visit: Payer: Self-pay | Admitting: Family Medicine

## 2021-03-04 ENCOUNTER — Ambulatory Visit: Payer: Medicare Other

## 2021-03-04 ENCOUNTER — Other Ambulatory Visit: Payer: Self-pay

## 2021-03-07 ENCOUNTER — Emergency Department (HOSPITAL_COMMUNITY): Payer: Medicare Other

## 2021-03-07 ENCOUNTER — Encounter (HOSPITAL_COMMUNITY): Payer: Self-pay | Admitting: Emergency Medicine

## 2021-03-07 ENCOUNTER — Emergency Department (HOSPITAL_COMMUNITY)
Admission: EM | Admit: 2021-03-07 | Discharge: 2021-03-08 | Disposition: A | Discharge status: Home / Self Care | Payer: Medicare Other | Attending: Emergency Medicine | Admitting: Emergency Medicine

## 2021-03-07 DIAGNOSIS — Z7901 Long term (current) use of anticoagulants: Secondary | ICD-10-CM | POA: Diagnosis not present

## 2021-03-07 DIAGNOSIS — R519 Headache, unspecified: Secondary | ICD-10-CM | POA: Diagnosis not present

## 2021-03-07 DIAGNOSIS — Z96642 Presence of left artificial hip joint: Secondary | ICD-10-CM | POA: Diagnosis not present

## 2021-03-07 DIAGNOSIS — Z043 Encounter for examination and observation following other accident: Secondary | ICD-10-CM | POA: Diagnosis not present

## 2021-03-07 DIAGNOSIS — S32020A Wedge compression fracture of second lumbar vertebra, initial encounter for closed fracture: Secondary | ICD-10-CM

## 2021-03-07 DIAGNOSIS — Y92009 Unspecified place in unspecified non-institutional (private) residence as the place of occurrence of the external cause: Secondary | ICD-10-CM

## 2021-03-07 DIAGNOSIS — I10 Essential (primary) hypertension: Secondary | ICD-10-CM | POA: Diagnosis not present

## 2021-03-07 DIAGNOSIS — W19XXXA Unspecified fall, initial encounter: Secondary | ICD-10-CM | POA: Diagnosis not present

## 2021-03-07 DIAGNOSIS — M549 Dorsalgia, unspecified: Secondary | ICD-10-CM | POA: Diagnosis not present

## 2021-03-07 DIAGNOSIS — Z79899 Other long term (current) drug therapy: Secondary | ICD-10-CM | POA: Diagnosis not present

## 2021-03-07 DIAGNOSIS — Z743 Need for continuous supervision: Secondary | ICD-10-CM | POA: Diagnosis not present

## 2021-03-07 DIAGNOSIS — I4891 Unspecified atrial fibrillation: Secondary | ICD-10-CM | POA: Diagnosis not present

## 2021-03-07 DIAGNOSIS — M545 Low back pain, unspecified: Secondary | ICD-10-CM | POA: Diagnosis not present

## 2021-03-07 DIAGNOSIS — S34109A Unspecified injury to unspecified level of lumbar spinal cord, initial encounter: Secondary | ICD-10-CM | POA: Diagnosis present

## 2021-03-07 DIAGNOSIS — R6889 Other general symptoms and signs: Secondary | ICD-10-CM | POA: Diagnosis not present

## 2021-03-07 DIAGNOSIS — Z859 Personal history of malignant neoplasm, unspecified: Secondary | ICD-10-CM | POA: Insufficient documentation

## 2021-03-07 MED ORDER — LIDOCAINE 5 % EX PTCH: 1.0000 | MEDICATED_PATCH | CUTANEOUS | Status: DC

## 2021-03-07 MED ORDER — ACETAMINOPHEN 500 MG PO TABS: 500.0000 mg | ORAL_TABLET | Freq: Four times a day (QID) | ORAL | 0 refills | Status: DC | PRN

## 2021-03-07 MED ORDER — HYDROCODONE-ACETAMINOPHEN 5-325 MG PO TABS
1.0000 | ORAL_TABLET | Freq: Once | ORAL | Status: AC
  Administered 2021-03-07: 1 via ORAL
  Filled 2021-03-07: qty 1

## 2021-03-07 MED ORDER — ACETAMINOPHEN 500 MG PO TABS: 500.0000 mg | ORAL_TABLET | ORAL | 0 refills | Status: DC | PRN

## 2021-03-07 MED ORDER — HYDROCODONE-ACETAMINOPHEN 5-325 MG PO TABS: 1.0000 | ORAL_TABLET | Freq: Two times a day (BID) | ORAL | 0 refills | Status: AC | PRN

## 2021-03-07 MED ORDER — LIDOCAINE 5 % EX PTCH
1.0000 | MEDICATED_PATCH | CUTANEOUS | Status: DC
  Administered 2021-03-07: 1 via TRANSDERMAL
  Filled 2021-03-07: qty 1

## 2021-03-07 NOTE — Discharge Instructions (Addendum)
We saw you in the ER after you had a fall. No evidence of brain bleed, however, the lumbar spine x-ray does reveal compression fracture over the upper lumbar spine.  We have placed you in a brace because of it.  The brace should stabilize your back and reduce your pain.  Please follow-up with the surgeon as requested.  For pain, we recommend that you take Tylenol 500 mg every 4 hours. We have given you a strong pain medicine, but that should be used only when the pain is excruciating and unbearable.  Do know that pain medications increase your risk of falls, confusion, delirium -all of which are worse sometimes in the pain itself.  Please be very careful with walking, and do everything possible to prevent falls.

## 2021-03-07 NOTE — ED Triage Notes (Signed)
Pt arrives [facility] from home after having mechanical fall at home this afternoon about 1630. Pt c/o pain to lower back, pt took tylenol at home with no relief. Pt arrives with 18G to left AC, given 30mg  toradol by EMS.

## 2021-03-07 NOTE — ED Provider Notes (Signed)
[facility] EMERGENCY DEPARTMENT Provider Note   CSN: [id number] Arrival date & time: 03/07/21  2053     History Chief Complaint  Patient presents with   [name] Small is a 87 y.o. female.  HPI    87 year old female with history of A. fib on Xarelto, hypertension, osteoporosis comes in with chief complaint of fall.  Patient lives independently by herself.  Reports that she normally does not have falls.  Today she was at home and fell in the afternoon around 430.  She took Tylenol for her pain, but had no significant relief.  Her pain is primarily over the middle of her back.  She denies any headache, loss of consciousness, focal weakness, numbness at this time.  She also denies any weakness in her legs, numbness in her legs.  There was no syncope prior to the fall or after the fall.  Past Medical History:  Diagnosis Date   Atrial fibrillation ([place])    Bleeding stomach ulcer 1980s?   Cancer ([place]) Meloanoma left leg   DIVERTICULOSIS, COLON 11/22/2006   Headache(784.0)    "very often; not regular" (11/25/2015)   History of blood transfusion 1980s   "when I had the bleeding ulcer"   HYPERLIPIDEMIA 11/22/2006   Hypertension    Menopausal syndrome (hot flashes)    OSTEOPOROSIS 11/22/2006   Presence of permanent cardiac pacemaker     Patient Active Problem List   Diagnosis Date Noted   Porokeratosis 05/02/2019   Hav (hallux abducto valgus), unspecified laterality 05/02/2019   Hyperglycemia 12/07/2014   Atrial fibrillation with RVR-CHADs VASc=4 12/07/2014   Intertrochanteric fracture of left hip ([place]) 07/08/2014   Overactive bladder 04/02/2012   Essential hypertension 04/02/2012   Chest pain, atypical 09/17/2011   Dyslipidemia 11/22/2006   DIVERTICULOSIS, COLON 11/22/2006   Osteoporosis 11/22/2006    Past Surgical History:  Procedure Laterality Date   APPENDECTOMY     BUNIONECTOMY Right    CATARACT EXTRACTION W/ INTRAOCULAR LENS  IMPLANT, BILATERAL Bilateral 2000s    CHOLECYSTECTOMY OPEN     EP IMPLANTABLE DEVICE N/A 11/25/2015   Procedure: Pacemaker Implant;  Surgeon: Will Meredith Leeds, MD;  Location: [facility];  Service: Cardiovascular;  Laterality: N/A;   FEMUR IM NAIL Left 07/08/2014   Procedure: INTRAMEDULLARY (IM) NAIL ;  Surgeon: Meredith Pel, MD;  Location[facility];  Service: Orthopedics;  Laterality: Left;   FRACTURE SURGERY     REVISION TOTAL HIP ARTHROPLASTY Left 06/2014     OB History   No obstetric history on file.     Family History  Problem Relation Age of Onset   Alzheimer's disease Sister    Cardiomyopathy Brother     Social History   Tobacco Use   Smoking status: Never   Smokeless tobacco: Never  Substance Use Topics   Alcohol use: No    Alcohol/week: 0.0 standard drinks   Drug use: No    Home Medications Prior to Admission medications   Medication Sig Start Date End Date Taking? Authorizing Provider  acetaminophen (TYLENOL) 500 MG tablet Take 1 tablet (500 mg total) by mouth every 6 (six) hours as needed. 03/07/21  Yes Varney Biles, MD  HYDROcodone-acetaminophen (NORCO/VICODIN) 5-325 MG tablet Take 1 tablet by mouth every 12 (twelve) hours as needed for up to 3 days for severe pain. 03/07/21 03/10/21 Yes Varney Biles, MD  acetaminophen (TYLENOL) 500 MG tablet Take 1 tablet (500 mg total) by mouth every 4 (four) hours as  needed for moderate pain. 03/07/21   Varney Biles, MD  amiodarone (PACERONE) 200 MG tablet TAKE 1 TABLET(200 MG) BY MOUTH DAILY 02/07/21   Josue Hector, MD  atorvastatin (LIPITOR) 40 MG tablet TAKE 1 TABLET(40 MG) BY MOUTH DAILY 02/11/21   Billie Ruddy, MD  Calcium Carbonate-Vitamin D (CALTRATE 600+D PO) Take 1 tablet by mouth daily.      [provider]  diclofenac sodium (VOLTAREN) 1 % GEL Apply 4 g topically 4 (four) times daily as needed. 01/25/18   Hilts, Legrand Como, MD  losartan (COZAAR) 50 MG tablet Take 1 tablet (50 mg total) by mouth daily. 08/08/16   Josue Hector, MD  metoprolol tartrate (LOPRESSOR) 25 MG tablet Take 1 tablet (25 mg total) by mouth 2 (two) times daily. 01/06/19   Camnitz, Ocie Doyne, MD  Polyvinyl Alcohol-Povidone (REFRESH OP) Place 1 drop into both eyes 2 (two) times daily.    [provider]  traMADol (ULTRAM) 50 MG tablet Take 1 tablet (50 mg total) by mouth every 12 (twelve) hours as needed. 05/06/20   Magnant, Charles L, PA-C  XARELTO 15 MG TABS tablet TAKE 1 TABLET(15 MG) BY MOUTH DAILY WITH DINNER 09/27/20   Josue Hector, MD    Allergies    Septra ds [sulfamethoxazole-trimethoprim] and Lisinopril  Review of Systems   Review of Systems  Constitutional:  Positive for activity change.  Respiratory:  Negative for shortness of breath.   Cardiovascular:  Negative for chest pain.  Musculoskeletal:  Positive for back pain.  Neurological:  Negative for headaches.  Hematological:  Bruises/bleeds easily.   Physical Exam Updated Vital Signs BP (!) 180/71   Pulse (!) 59   Temp 97.7 F (36.5 C) (Oral)   Resp 16   Ht 5\' 5"  (1.651 m)   Wt 54.4 kg   SpO2 98%   BMI 19.97 kg/m   Physical Exam Vitals and nursing note reviewed.  Constitutional:      Appearance: She is well-developed.  HENT:     Head: Atraumatic.  Eyes:     Extraocular Movements: Extraocular movements intact.     Pupils: Pupils are equal, round, and reactive to light.  Neck:     Comments: No midline c-spine tenderness, pt able to turn head to 45 degrees bilaterally without any pain and able to flex neck to the chest and extend without any pain or neurologic symptoms.  Cardiovascular:     Rate and Rhythm: Normal rate.  Pulmonary:     Effort: Pulmonary effort is normal.  Musculoskeletal:     Cervical back: Normal range of motion and neck supple.     Comments: Patient has focal tenderness over the L2 spine.  No ecchymosis, bruising over the torso, upper lower extremity  Otherwise: Head to toe evaluation shows no hematoma, bleeding of the scalp,  no facial abrasions, no spine step offs, crepitus of the chest or neck, no tenderness to palpation of the bilateral upper and lower extremities, no gross deformities, no chest tenderness, no pelvic pain.   Skin:    General: Skin is warm and dry.  Neurological:     Mental Status: She is alert and oriented to person, place, and time.    ED Results / Procedures / Treatments   Labs (all labs ordered are listed, but only abnormal results are displayed) Labs Reviewed - No data to display  EKG None  Radiology DG Lumbar Spine Complete  Result Date: 03/07/2021 CLINICAL DATA:  Fall with lower  back pain. EXAM: LUMBAR SPINE - COMPLETE 4+ VIEW COMPARISON:  X-ray sacrum and coccyx 10/18/2016. CT KUB 12/13/2019. FINDINGS: The bones are osteopenic. There is new compression deformity of the superior endplate of L2 with 28% loss vertebral body height. Vertebral body heights are otherwise well maintained. There is mild dextroconvex curvature of the lumbar spine which may be positional. Spinal alignment appears within normal limits. There is mild disc space narrowing throughout the lumbar spine which is stable. There are atherosclerotic calcifications of the aorta. Left-sided hip screw is partially visualized. IMPRESSION: 1. 25% compression deformity superior endplate of L2 is new from 12/13/2019. Please correlate clinically for acuity. 2. Diffuse osteopenia. 3. Mild degenerative changes are stable. 4.  Aortic Atherosclerosis (ICD10-I70.0). Electronically Signed   By: Ronney Asters M.D.   On: 03/07/2021 21:44   CT Head Wo Contrast  Result Date: 03/07/2021 CLINICAL DATA:  Fall, on blood thinners EXAM: CT HEAD WITHOUT CONTRAST TECHNIQUE: Contiguous axial images were obtained from the base of the skull through the vertex without intravenous contrast. COMPARISON:  12/27/2015 FINDINGS: Brain: There is atrophy and chronic small vessel disease changes. No acute intracranial abnormality. Specifically, no hemorrhage,  hydrocephalus, mass lesion, acute infarction, or significant intracranial injury. Vascular: No hyperdense vessel or unexpected calcification. Skull: No acute calvarial abnormality. Sinuses/Orbits: No acute findings Other: 90 IMPRESSION: Atrophy, chronic microvascular disease. No acute intracranial abnormality. Electronically Signed   By: Rolm Baptise M.D.   On: 03/07/2021 22:46    Procedures Procedures   Medications Ordered in ED Medications  lidocaine (LIDODERM) 5 % 1 patch (1 patch Transdermal Patch Applied 03/07/21 2302)  lidocaine (LIDODERM) 5 % 1 patch (has no administration in time range)  HYDROcodone-acetaminophen (NORCO/VICODIN) 5-325 MG per tablet 1 tablet (1 tablet Oral Given 03/07/21 2302)    ED Course  I have reviewed the triage vital signs and the nursing notes.  Pertinent labs & imaging results that were available during my care of the patient were reviewed by me and considered in my medical decision making (see chart for details).    MDM Rules/Calculators/A&P                           87 year old female with history of A. fib on Xarelto comes in with chief complaint of mechanical fall.  She is complaining of back pain and is noted to have focal L2 spine tenderness.  X-ray of her lumbar spine does confirm an L2 compression fracture.  Patient had taken Tylenol at home without significant relief.  She is still complaining of about 9 out of 10 pain.  We will apply a TLSO brace and have her follow-up with neurosurgery if her symptoms are not getting better. Pain control will be with that of Tylenol finder milligrams every 4 hours along with lidocaine patch and as needed Norco.  Discussed with the patient and the pitfalls of strong pain medication which include delirium, falls.  CT head was ordered because of her being on Xarelto and is negative.  C-spine was cleared clinically.  Patient has no tenderness over the pelvic area OR CHEST PAIN/DIB.  Final Clinical Impression(s)  / ED Diagnoses Final diagnoses:  Closed compression fracture of L2 lumbar vertebra, initial encounter ([place])  Fall in home, initial encounter    Rx / DC Orders ED Discharge Orders          Ordered    acetaminophen (TYLENOL) 500 MG tablet  Every 6 hours PRN  03/07/21 2327    acetaminophen (TYLENOL) 500 MG tablet  Every 4 hours PRN        03/07/21 2327    HYDROcodone-acetaminophen (NORCO/VICODIN) 5-325 MG tablet  Every 12 hours PRN        03/07/21 2327             Varney Biles, MD 03/07/21 2335

## 2021-03-07 NOTE — ED Notes (Signed)
Ortho paged for Brace

## 2021-03-07 NOTE — Progress Notes (Signed)
Orthopedic Tech Progress Note Patient Details:  [name] Small [birth year] [id number]  Patient ID: [name] Small, female   DOB: 1928-09-23, 87 y.o.   MRN: [id number] Placed order with [facility] for a TLSO clamshell brace. Vernona Rieger 03/07/2021, 10:41 PM

## 2021-03-07 NOTE — ED Notes (Signed)
Pt ready for d/c but waiting on tlso brace.

## 2021-03-08 ENCOUNTER — Ambulatory Visit: Payer: Medicare Other

## 2021-03-09 ENCOUNTER — Encounter (HOSPITAL_COMMUNITY): Payer: Self-pay

## 2021-03-09 ENCOUNTER — Other Ambulatory Visit: Payer: Self-pay

## 2021-03-09 ENCOUNTER — Emergency Department (HOSPITAL_COMMUNITY)
Admission: EM | Admit: 2021-03-09 | Discharge: 2021-03-09 | Disposition: A | Discharge status: Home / Self Care | Payer: Medicare Other | Attending: Emergency Medicine | Admitting: Emergency Medicine

## 2021-03-09 DIAGNOSIS — Z79899 Other long term (current) drug therapy: Secondary | ICD-10-CM | POA: Insufficient documentation

## 2021-03-09 DIAGNOSIS — M545 Low back pain, unspecified: Secondary | ICD-10-CM | POA: Diagnosis not present

## 2021-03-09 DIAGNOSIS — I4891 Unspecified atrial fibrillation: Secondary | ICD-10-CM | POA: Insufficient documentation

## 2021-03-09 DIAGNOSIS — Z950 Presence of cardiac pacemaker: Secondary | ICD-10-CM | POA: Insufficient documentation

## 2021-03-09 DIAGNOSIS — Z859 Personal history of malignant neoplasm, unspecified: Secondary | ICD-10-CM | POA: Diagnosis not present

## 2021-03-09 DIAGNOSIS — Z7901 Long term (current) use of anticoagulants: Secondary | ICD-10-CM | POA: Diagnosis not present

## 2021-03-09 DIAGNOSIS — Z96642 Presence of left artificial hip joint: Secondary | ICD-10-CM | POA: Insufficient documentation

## 2021-03-09 DIAGNOSIS — I10 Essential (primary) hypertension: Secondary | ICD-10-CM | POA: Diagnosis not present

## 2021-03-09 MED ORDER — OXYCODONE-ACETAMINOPHEN 5-325 MG PO TABS
1.0000 | ORAL_TABLET | Freq: Once | ORAL | Status: AC
Start: 2021-03-09 — End: 2021-03-09
  Administered 2021-03-09: 1 via ORAL
  Filled 2021-03-09: qty 1

## 2021-03-09 NOTE — Discharge Instructions (Addendum)
Likely your pain is from the compression fracture, please continue to wear the brace that was provided to you, continue with over-the-counter pain medication as needed, recommend applying heat or ice to the area as this help decrease pain and inflammation.  You were prescribed Norco from your last visit, it was sent to [facility] on [facility] you may pick it up.  As needed  Your blood pressure slightly elevated today, please remember to continue with your blood pressure medications, follow-up with your PCP as needed.  Please follow-up with neurosurgery for further evaluation.  Come back to the emergency department if you develop chest pain, shortness of breath, severe abdominal pain, uncontrolled nausea, vomiting, diarrhea.

## 2021-03-09 NOTE — ED Triage Notes (Signed)
Pt seen here 10/17 d/t fall, lower back pain, taken tylenol without relief. Denies any new falls.

## 2021-03-09 NOTE — ED Provider Notes (Signed)
[facility] EMERGENCY DEPARTMENT Provider Note   CSN: [id number] Arrival date & time: 03/09/21  1332     History Chief Complaint  Patient presents with   Back Pain    [name] Small is a 87 y.o. female.  HPI  Patient with significant medical history of A. fib, currently on Xarelto, hypertension, osteoporosis presents with chief complaint of lower back pain.  Patient states she has been having back pain since she fell on 10/17, states the pain is in her lower back, does not radiate, has no associated paresthesia or weakness in the lower extremities, she denies recent falls, she denies saddle paresthesias, urinary symptoms, urinary incontinency, difficult bowel movements.  She states that ambulating and bending tends to make the pain worse, she will states that when she takes a deep breath she will pain in her lower back, she has no pain in her chest, no difficulty breathing, no shortness of breath.  Has no other complaints this time.  States been taking ibuprofen without much relief.  After reviewing patient's chart patient was seen here on the 17th, plain films were obtained, she has a compression fracture noted  at L2 with 25% compression, head CT was also obtained was negative for findings, TLOS brace was placed and recommend that she follow-up with neurosurgery, she was given a prescription for Norco but the patient was not aware this.  Past Medical History:  Diagnosis Date   Atrial fibrillation ([place])    Bleeding stomach ulcer 1980s?   Cancer ([place]) Meloanoma left leg   DIVERTICULOSIS, COLON 11/22/2006   Headache(784.0)    "very often; not regular" (11/25/2015)   History of blood transfusion 1980s   "when I had the bleeding ulcer"   HYPERLIPIDEMIA 11/22/2006   Hypertension    Menopausal syndrome (hot flashes)    OSTEOPOROSIS 11/22/2006   Presence of permanent cardiac pacemaker     Patient Active Problem List   Diagnosis Date Noted   Porokeratosis 05/02/2019   Hav (hallux abducto  valgus), unspecified laterality 05/02/2019   Hyperglycemia 12/07/2014   Atrial fibrillation with RVR-CHADs VASc=4 12/07/2014   Intertrochanteric fracture of left hip ([place]) 07/08/2014   Overactive bladder 04/02/2012   Essential hypertension 04/02/2012   Chest pain, atypical 09/17/2011   Dyslipidemia 11/22/2006   DIVERTICULOSIS, COLON 11/22/2006   Osteoporosis 11/22/2006    Past Surgical History:  Procedure Laterality Date   APPENDECTOMY     BUNIONECTOMY Right    CATARACT EXTRACTION W/ INTRAOCULAR LENS  IMPLANT, BILATERAL Bilateral 2000s   CHOLECYSTECTOMY OPEN     EP IMPLANTABLE DEVICE N/A 11/25/2015   Procedure: Pacemaker Implant;  Surgeon: Will Meredith Leeds, MD;  Location: [place] [facility];  Service: Cardiovascular;  Laterality: N/A;   FEMUR IM NAIL Left 07/08/2014   Procedure: INTRAMEDULLARY (IM) NAIL ;  Surgeon: Meredith Pel, MD;  Location: [facility];  Service: Orthopedics;  Laterality: Left;   FRACTURE SURGERY     REVISION TOTAL HIP ARTHROPLASTY Left 06/2014     OB History   No obstetric history on file.     Family History  Problem Relation Age of Onset   Alzheimer's disease Sister    Cardiomyopathy Brother     Social History   Tobacco Use   Smoking status: Never   Smokeless tobacco: Never  Substance Use Topics   Alcohol use: No    Alcohol/week: 0.0 standard drinks   Drug use: No    Home Medications Prior to Admission medications   Medication Sig Start  Date End Date Taking? Authorizing Provider  acetaminophen (TYLENOL) 500 MG tablet Take 1 tablet (500 mg total) by mouth every 6 (six) hours as needed. 03/07/21   Varney Biles, MD  acetaminophen (TYLENOL) 500 MG tablet Take 1 tablet (500 mg total) by mouth every 4 (four) hours as needed for moderate pain. 03/07/21   Varney Biles, MD  amiodarone (PACERONE) 200 MG tablet TAKE 1 TABLET(200 MG) BY MOUTH DAILY 02/07/21   Josue Hector, MD  atorvastatin (LIPITOR) 40 MG tablet TAKE 1 TABLET(40 MG) BY MOUTH  DAILY 02/11/21   Billie Ruddy, MD  Calcium Carbonate-Vitamin D (CALTRATE 600+D PO) Take 1 tablet by mouth daily.      [provider]  diclofenac sodium (VOLTAREN) 1 % GEL Apply 4 g topically 4 (four) times daily as needed. 01/25/18   Hilts, Legrand Como, MD  HYDROcodone-acetaminophen (NORCO/VICODIN) 5-325 MG tablet Take 1 tablet by mouth every 12 (twelve) hours as needed for up to 3 days for severe pain. 03/07/21 03/10/21  Varney Biles, MD  losartan (COZAAR) 50 MG tablet Take 1 tablet (50 mg total) by mouth daily. 08/08/16   Josue Hector, MD  metoprolol tartrate (LOPRESSOR) 25 MG tablet Take 1 tablet (25 mg total) by mouth 2 (two) times daily. 01/06/19   Camnitz, Ocie Doyne, MD  Polyvinyl Alcohol-Povidone (REFRESH OP) Place 1 drop into both eyes 2 (two) times daily.    [provider]  traMADol (ULTRAM) 50 MG tablet Take 1 tablet (50 mg total) by mouth every 12 (twelve) hours as needed. 05/06/20   Magnant, Charles L, PA-C  XARELTO 15 MG TABS tablet TAKE 1 TABLET(15 MG) BY MOUTH DAILY WITH DINNER 09/27/20   Josue Hector, MD    Allergies    Septra ds [sulfamethoxazole-trimethoprim] and Lisinopril  Review of Systems   Review of Systems  Constitutional:  Negative for chills and fever.  HENT:  Negative for congestion.   Respiratory:  Negative for shortness of breath.   Cardiovascular:  Negative for chest pain.  Gastrointestinal:  Negative for abdominal pain.  Genitourinary:  Negative for enuresis.  Musculoskeletal:  Positive for back pain.  Skin:  Negative for rash.  Neurological:  Negative for dizziness.  Hematological:  Does not bruise/bleed easily.   Physical Exam Updated Vital Signs BP (!) 194/61 (BP Location: Left Arm)   Pulse 62   Temp 98.2 F (36.8 C) (Oral)   Resp (!) 8   Ht 5\' 5"  (1.651 m)   Wt 54.4 kg   SpO2 96%   BMI 19.96 kg/m   Physical Exam Vitals and nursing note reviewed.  Constitutional:      General: She is not in acute distress.     Appearance: She is not ill-appearing.  HENT:     Head: Normocephalic and atraumatic.     Comments: No deformity to head present, no raccoon eyes or battle sign noted.    Nose: No congestion.  Eyes:     Conjunctiva/sclera: Conjunctivae normal.  Cardiovascular:     Rate and Rhythm: Normal rate and regular rhythm.     Pulses: Normal pulses.     Heart sounds: No murmur heard.   No friction rub. No gallop.  Pulmonary:     Effort: No respiratory distress.     Breath sounds: No wheezing, rhonchi or rales.  Chest:     Chest wall: No tenderness.  Abdominal:     Palpations: Abdomen is soft.     Tenderness: There is no abdominal tenderness.  There is no right CVA tenderness or left CVA tenderness.  Musculoskeletal:     Cervical back: Normal range of motion and neck supple.     Comments: Patient has full range of motion, 5 of 5 strength in the upper/lower extremities, there is no tenderness along her upper and/or lower extremities.  Spine was palpated she was notably tender lower lumbar region, no step-off present.  Hips were nontender to palpation, no pelvis instability present, able to ambulate without difficulty.    Skin:    General: Skin is warm and dry.  Neurological:     Mental Status: She is alert.     Comments: No facial asymmetry, no difficulty word finding, able to follow two-step commands, no unilateral weakness present, gait fully intact.  Psychiatric:        Mood and Affect: Mood normal.    ED Results / Procedures / Treatments   Labs (all labs ordered are listed, but only abnormal results are displayed) Labs Reviewed - No data to display  EKG None  Radiology DG Lumbar Spine Complete  Result Date: 03/07/2021 CLINICAL DATA:  Fall with lower back pain. EXAM: LUMBAR SPINE - COMPLETE 4+ VIEW COMPARISON:  X-ray sacrum and coccyx 10/18/2016. CT KUB 12/13/2019. FINDINGS: The bones are osteopenic. There is new compression deformity of the superior endplate of L2 with 60% loss  vertebral body height. Vertebral body heights are otherwise well maintained. There is mild dextroconvex curvature of the lumbar spine which may be positional. Spinal alignment appears within normal limits. There is mild disc space narrowing throughout the lumbar spine which is stable. There are atherosclerotic calcifications of the aorta. Left-sided hip screw is partially visualized. IMPRESSION: 1. 25% compression deformity superior endplate of L2 is new from 12/13/2019. Please correlate clinically for acuity. 2. Diffuse osteopenia. 3. Mild degenerative changes are stable. 4.  Aortic Atherosclerosis (ICD10-I70.0). Electronically Signed   By: Ronney Asters M.D.   On: 03/07/2021 21:44   CT Head Wo Contrast  Result Date: 03/07/2021 CLINICAL DATA:  Fall, on blood thinners EXAM: CT HEAD WITHOUT CONTRAST TECHNIQUE: Contiguous axial images were obtained from the base of the skull through the vertex without intravenous contrast. COMPARISON:  12/27/2015 FINDINGS: Brain: There is atrophy and chronic small vessel disease changes. No acute intracranial abnormality. Specifically, no hemorrhage, hydrocephalus, mass lesion, acute infarction, or significant intracranial injury. Vascular: No hyperdense vessel or unexpected calcification. Skull: No acute calvarial abnormality. Sinuses/Orbits: No acute findings Other: 90 IMPRESSION: Atrophy, chronic microvascular disease. No acute intracranial abnormality. Electronically Signed   By: Rolm Baptise M.D.   On: 03/07/2021 22:46    Procedures Procedures   Medications Ordered in ED Medications  oxyCODONE-acetaminophen (PERCOCET/ROXICET) 5-325 MG per tablet 1 tablet (1 tablet Oral Given 03/09/21 1618)    ED Course  I have reviewed the triage vital signs and the nursing notes.  Pertinent labs & imaging results that were available during my care of the patient were reviewed by me and considered in my medical decision making (see chart for details).    MDM  Rules/Calculators/A&P                          Initial impression-patient presents with back pain.  She is alert, does not appear acute stress, vital signs notable for hypertension of 194/61.  Will provide patient pain medications and reassess.  Work-up-due to well-appearing patient, benign physical exam, further lab or imaging are not warranted at this time.  Reassessment-patient  reassessed, has no complaints this time, patient is agreeable for discharge.  Rule out- I have low suspicion for worsening spinal fracture or spinal cord abnormality as patient denies urinary incontinency, retention, difficulty with bowel movements, denies saddle paresthesias.  Spine was palpated there is no step-off, crepitus or gross deformities felt, patient had 5/5 strength, full range of motion, neurovascular fully intact in the lower extremities.  Will defer further imaging as there is no new trauma to the area, she had x-rays performed at her last visit and compression fractures consistent where pain was located on my exam.  Low suspicion for dissection of aortic artery as presentation a feel of etiology.  I have low suspicion for UTI, [place], kidney stone as she denies any urinary symptoms, no CVA tenderness.  Noted the patient has an elevated BP patient states that she did not take her blood pressure medication today, likely this is multifactorial missed medication as well as pain will encourage that she continue with her blood pressure medications, I have low suspicion for hypertensive  emergency at this time.   Plan-  Back pain-likely this is continued pain from the compression fracture, pain is uncontrolled at this time, will have her pick up the pain medication that was prescribed from her last visit, encouraged that she follows up with neurosurgery and continue to wear the back brace.  Gave her strict return precautions.  Vital signs have remained stable, no indication for hospital admission.  Patient discussed  with attending and they agreed with assessment and plan.  Patient given at home care as well strict return precautions.  Patient verbalized that they understood agreed to said plan.  Final Clinical Impression(s) / ED Diagnoses Final diagnoses:  Acute midline low back pain without sciatica    Rx / DC Orders ED Discharge Orders     None        Marcello Fennel, PA-C 03/09/21 1724    Noemi Chapel, MD 03/10/21 2325

## 2021-03-11 ENCOUNTER — Other Ambulatory Visit: Payer: Self-pay | Admitting: Family Medicine

## 2021-03-11 ENCOUNTER — Telehealth: Payer: Self-pay | Admitting: Family Medicine

## 2021-03-11 DIAGNOSIS — S32020D Wedge compression fracture of second lumbar vertebra, subsequent encounter for fracture with routine healing: Secondary | ICD-10-CM

## 2021-03-11 MED ORDER — CALCITONIN (SALMON) 200 UNIT/ACT NA SOLN: 1.0000 | Freq: Every day | NASAL | 0 refills | Status: DC

## 2021-03-11 NOTE — Telephone Encounter (Signed)
Pt daughter Juliann Pulse is calling and her mother went to [facility] on 03-07-2021 and 03-09-2021. Pt had a fall and fractured her  lumbar 2 spine. Pt was given rx  oxycodone #6 patient starting taking medication on 03-09-2021. Pt has only 2 pills left. Pt has an appt to see dr Zada Finders ortho surgeon on 03-17-2021. [place], [place] AT [place] P: [phone] F: [phone]

## 2021-03-12 ENCOUNTER — Other Ambulatory Visit: Payer: Self-pay | Admitting: Family Medicine

## 2021-03-12 DIAGNOSIS — S32020D Wedge compression fracture of second lumbar vertebra, subsequent encounter for fracture with routine healing: Secondary | ICD-10-CM

## 2021-03-16 ENCOUNTER — Inpatient Hospital Stay (HOSPITAL_COMMUNITY)
Admission: EM | Admit: 2021-03-16 | Discharge: 2021-03-23 | DRG: 478 | Disposition: A | Discharge status: Skilled Nursing Facility | Payer: Medicare Other | Attending: Internal Medicine | Admitting: Internal Medicine

## 2021-03-16 ENCOUNTER — Encounter (HOSPITAL_COMMUNITY): Payer: Self-pay | Admitting: Internal Medicine

## 2021-03-16 ENCOUNTER — Other Ambulatory Visit: Payer: Self-pay

## 2021-03-16 DIAGNOSIS — W19XXXA Unspecified fall, initial encounter: Secondary | ICD-10-CM | POA: Diagnosis present

## 2021-03-16 DIAGNOSIS — Z20822 Contact with and (suspected) exposure to covid-19: Secondary | ICD-10-CM | POA: Diagnosis present

## 2021-03-16 DIAGNOSIS — E785 Hyperlipidemia, unspecified: Secondary | ICD-10-CM | POA: Diagnosis not present

## 2021-03-16 DIAGNOSIS — R5381 Other malaise: Secondary | ICD-10-CM | POA: Diagnosis not present

## 2021-03-16 DIAGNOSIS — Z743 Need for continuous supervision: Secondary | ICD-10-CM | POA: Diagnosis not present

## 2021-03-16 DIAGNOSIS — M5459 Other low back pain: Secondary | ICD-10-CM | POA: Diagnosis not present

## 2021-03-16 DIAGNOSIS — R5383 Other fatigue: Secondary | ICD-10-CM | POA: Diagnosis not present

## 2021-03-16 DIAGNOSIS — Z888 Allergy status to other drugs, medicaments and biological substances status: Secondary | ICD-10-CM | POA: Diagnosis not present

## 2021-03-16 DIAGNOSIS — R2689 Other abnormalities of gait and mobility: Secondary | ICD-10-CM | POA: Diagnosis not present

## 2021-03-16 DIAGNOSIS — S32028A Other fracture of second lumbar vertebra, initial encounter for closed fracture: Principal | ICD-10-CM | POA: Diagnosis present

## 2021-03-16 DIAGNOSIS — Z96642 Presence of left artificial hip joint: Secondary | ICD-10-CM | POA: Diagnosis present

## 2021-03-16 DIAGNOSIS — I495 Sick sinus syndrome: Secondary | ICD-10-CM | POA: Diagnosis not present

## 2021-03-16 DIAGNOSIS — S32020D Wedge compression fracture of second lumbar vertebra, subsequent encounter for fracture with routine healing: Secondary | ICD-10-CM | POA: Diagnosis not present

## 2021-03-16 DIAGNOSIS — Z950 Presence of cardiac pacemaker: Secondary | ICD-10-CM

## 2021-03-16 DIAGNOSIS — M545 Low back pain, unspecified: Secondary | ICD-10-CM | POA: Diagnosis not present

## 2021-03-16 DIAGNOSIS — I4821 Permanent atrial fibrillation: Secondary | ICD-10-CM | POA: Diagnosis present

## 2021-03-16 DIAGNOSIS — S32000A Wedge compression fracture of unspecified lumbar vertebra, initial encounter for closed fracture: Secondary | ICD-10-CM | POA: Diagnosis present

## 2021-03-16 DIAGNOSIS — M8008XA Age-related osteoporosis with current pathological fracture, vertebra(e), initial encounter for fracture: Secondary | ICD-10-CM | POA: Diagnosis present

## 2021-03-16 DIAGNOSIS — Z79899 Other long term (current) drug therapy: Secondary | ICD-10-CM

## 2021-03-16 DIAGNOSIS — I700 Atherosclerosis of aorta: Secondary | ICD-10-CM | POA: Diagnosis not present

## 2021-03-16 DIAGNOSIS — Z23 Encounter for immunization: Secondary | ICD-10-CM | POA: Diagnosis not present

## 2021-03-16 DIAGNOSIS — I480 Paroxysmal atrial fibrillation: Secondary | ICD-10-CM | POA: Diagnosis not present

## 2021-03-16 DIAGNOSIS — S32020A Wedge compression fracture of second lumbar vertebra, initial encounter for closed fracture: Secondary | ICD-10-CM | POA: Diagnosis not present

## 2021-03-16 DIAGNOSIS — I160 Hypertensive urgency: Secondary | ICD-10-CM | POA: Diagnosis not present

## 2021-03-16 DIAGNOSIS — M549 Dorsalgia, unspecified: Secondary | ICD-10-CM | POA: Diagnosis not present

## 2021-03-16 DIAGNOSIS — I10 Essential (primary) hypertension: Secondary | ICD-10-CM

## 2021-03-16 DIAGNOSIS — I129 Hypertensive chronic kidney disease with stage 1 through stage 4 chronic kidney disease, or unspecified chronic kidney disease: Secondary | ICD-10-CM | POA: Diagnosis present

## 2021-03-16 DIAGNOSIS — Z79891 Long term (current) use of opiate analgesic: Secondary | ICD-10-CM

## 2021-03-16 DIAGNOSIS — Z9181 History of falling: Secondary | ICD-10-CM | POA: Diagnosis not present

## 2021-03-16 DIAGNOSIS — M899 Disorder of bone, unspecified: Secondary | ICD-10-CM | POA: Diagnosis not present

## 2021-03-16 DIAGNOSIS — Z7901 Long term (current) use of anticoagulants: Secondary | ICD-10-CM

## 2021-03-16 DIAGNOSIS — Z9049 Acquired absence of other specified parts of digestive tract: Secondary | ICD-10-CM | POA: Diagnosis not present

## 2021-03-16 DIAGNOSIS — Z961 Presence of intraocular lens: Secondary | ICD-10-CM | POA: Diagnosis present

## 2021-03-16 DIAGNOSIS — T07XXXA Unspecified multiple injuries, initial encounter: Secondary | ICD-10-CM | POA: Diagnosis not present

## 2021-03-16 DIAGNOSIS — R6889 Other general symptoms and signs: Secondary | ICD-10-CM | POA: Diagnosis not present

## 2021-03-16 DIAGNOSIS — N1831 Chronic kidney disease, stage 3a: Secondary | ICD-10-CM | POA: Diagnosis not present

## 2021-03-16 DIAGNOSIS — M6281 Muscle weakness (generalized): Secondary | ICD-10-CM | POA: Diagnosis not present

## 2021-03-16 DIAGNOSIS — M810 Age-related osteoporosis without current pathological fracture: Secondary | ICD-10-CM | POA: Diagnosis present

## 2021-03-16 DIAGNOSIS — R278 Other lack of coordination: Secondary | ICD-10-CM | POA: Diagnosis not present

## 2021-03-16 DIAGNOSIS — R531 Weakness: Secondary | ICD-10-CM | POA: Diagnosis not present

## 2021-03-16 LAB — BASIC METABOLIC PANEL
Anion gap: 10 (ref 5–15)
BUN: 14 mg/dL (ref 8–23)
CO2: 23 mmol/L (ref 22–32)
Calcium: 9.1 mg/dL (ref 8.9–10.3)
Chloride: 105 mmol/L (ref 98–111)
Creatinine, Ser: 1.13 mg/dL — ABNORMAL HIGH (ref 0.44–1.00)
GFR, Estimated: 46 mL/min — ABNORMAL LOW (ref >60)
Glucose, Bld: 101 mg/dL — ABNORMAL HIGH (ref 70–99)
Potassium: 3.6 mmol/L (ref 3.5–5.1)
Sodium: 138 mmol/L (ref 135–145)

## 2021-03-16 LAB — CBC
HCT: 40.5 % (ref 36.0–46.0)
Hemoglobin: 12.9 g/dL (ref 12.0–15.0)
MCH: 30.1 pg (ref 26.0–34.0)
MCHC: 31.9 g/dL (ref 30.0–36.0)
MCV: 94.4 fL (ref 80.0–100.0)
Platelets: 242 10*3/uL (ref 150–400)
RBC: 4.29 MIL/uL (ref 3.87–5.11)
RDW: 13.1 % (ref 11.5–15.5)
WBC: 8.7 10*3/uL (ref 4.0–10.5)
nRBC: 0 % (ref 0.0–0.2)

## 2021-03-16 MED ORDER — LOSARTAN POTASSIUM 50 MG PO TABS
50.0000 mg | ORAL_TABLET | Freq: Every day | ORAL | Status: DC
Start: 2021-03-17 — End: 2021-03-17

## 2021-03-16 MED ORDER — HYDRALAZINE HCL 20 MG/ML IJ SOLN
10.0000 mg | INTRAMUSCULAR | Status: DC | PRN
  Administered 2021-03-17 – 2021-03-18 (×3): 10 mg via INTRAVENOUS
  Filled 2021-03-16 (×3): qty 1

## 2021-03-16 MED ORDER — ATORVASTATIN CALCIUM 40 MG PO TABS
40.0000 mg | ORAL_TABLET | Freq: Every day | ORAL | Status: DC
  Administered 2021-03-17 – 2021-03-23 (×7): 40 mg via ORAL
  Filled 2021-03-16 (×7): qty 1

## 2021-03-16 MED ORDER — OXYCODONE HCL 5 MG PO TABS
5.0000 mg | ORAL_TABLET | ORAL | Status: DC | PRN
  Administered 2021-03-17 – 2021-03-21 (×3): 5 mg via ORAL
  Filled 2021-03-16 (×3): qty 1

## 2021-03-16 MED ORDER — METOPROLOL TARTRATE 25 MG PO TABS
25.0000 mg | ORAL_TABLET | Freq: Once | ORAL | Status: AC
  Administered 2021-03-16: 25 mg via ORAL

## 2021-03-16 MED ORDER — METOPROLOL TARTRATE 25 MG PO TABS: 25.0000 mg | ORAL_TABLET | Freq: Two times a day (BID) | ORAL | Status: DC

## 2021-03-16 MED ORDER — CALCITONIN (SALMON) 200 UNIT/ACT NA SOLN
1.0000 | Freq: Every day | NASAL | Status: DC
  Administered 2021-03-17 – 2021-03-21 (×5): 1 via NASAL
  Filled 2021-03-16: qty 3.7

## 2021-03-16 MED ORDER — AMIODARONE HCL 200 MG PO TABS
200.0000 mg | ORAL_TABLET | Freq: Once | ORAL | Status: AC
  Administered 2021-03-16: 200 mg via ORAL
  Filled 2021-03-16: qty 1

## 2021-03-16 MED ORDER — ACETAMINOPHEN 650 MG RE SUPP: 650.0000 mg | Freq: Four times a day (QID) | RECTAL | Status: DC | PRN

## 2021-03-16 MED ORDER — OXYCODONE-ACETAMINOPHEN 5-325 MG PO TABS: 1.0000 | ORAL_TABLET | Freq: Once | ORAL | Status: DC

## 2021-03-16 MED ORDER — AMIODARONE HCL 200 MG PO TABS
200.0000 mg | ORAL_TABLET | Freq: Every day | ORAL | Status: DC
Start: 2021-03-17 — End: 2021-03-23
  Administered 2021-03-17 – 2021-03-23 (×7): 200 mg via ORAL
  Filled 2021-03-16 (×7): qty 1

## 2021-03-16 MED ORDER — MORPHINE SULFATE (PF) 2 MG/ML IV SOLN
2.0000 mg | INTRAVENOUS | Status: DC | PRN
Start: 2021-03-16 — End: 2021-03-17

## 2021-03-16 MED ORDER — MORPHINE SULFATE (PF) 4 MG/ML IV SOLN
4.0000 mg | Freq: Once | INTRAVENOUS | Status: AC
  Administered 2021-03-16: 4 mg via INTRAVENOUS
  Filled 2021-03-16: qty 1

## 2021-03-16 MED ORDER — METOPROLOL TARTRATE 25 MG PO TABS
25.0000 mg | ORAL_TABLET | Freq: Two times a day (BID) | ORAL | Status: DC
  Filled 2021-03-16: qty 1

## 2021-03-16 MED ORDER — ONDANSETRON HCL 4 MG/2ML IJ SOLN
4.0000 mg | Freq: Once | INTRAMUSCULAR | Status: AC
  Administered 2021-03-16: 4 mg via INTRAVENOUS
  Filled 2021-03-16: qty 2

## 2021-03-16 MED ORDER — DOCUSATE SODIUM 100 MG PO CAPS
100.0000 mg | ORAL_CAPSULE | Freq: Two times a day (BID) | ORAL | Status: DC
  Administered 2021-03-17 – 2021-03-23 (×9): 100 mg via ORAL
  Filled 2021-03-16 (×12): qty 1

## 2021-03-16 MED ORDER — ACETAMINOPHEN 325 MG PO TABS
650.0000 mg | ORAL_TABLET | Freq: Four times a day (QID) | ORAL | Status: DC | PRN
  Administered 2021-03-17: 650 mg via ORAL
  Filled 2021-03-16: qty 2

## 2021-03-16 MED ORDER — LOSARTAN POTASSIUM 50 MG PO TABS
50.0000 mg | ORAL_TABLET | Freq: Every day | ORAL | Status: DC
  Administered 2021-03-16: 50 mg via ORAL
  Filled 2021-03-16: qty 1

## 2021-03-16 MED ORDER — HEPARIN (PORCINE) 25000 UT/250ML-% IV SOLN
650.0000 [IU]/h | INTRAVENOUS | Status: DC
  Administered 2021-03-17: 750 [IU]/h via INTRAVENOUS
  Administered 2021-03-18: 650 [IU]/h via INTRAVENOUS
  Filled 2021-03-16 (×2): qty 250

## 2021-03-16 NOTE — ED Triage Notes (Signed)
Pt via [facility] for eval of back pain since fall on 10/17. Evaluated at [facility] for same at that time and dx with L2 compression fx and then told she has sciatic nerve pain. Taking Tylenol without relief. Appointment with nsgy for tomorrow but is now unable to walk.

## 2021-03-16 NOTE — ED Provider Notes (Signed)
Patient taken in signout from [place].  She had a fall and had a 25% height loss of L2 fracture on plain film on the 17th.  Patient has had several ER visits for pain complaints.  She is currently taking Tylenol however did have oxycodone which helped with her pain somewhat more however I discussed the case with her son and he states that she has had severe difficulty getting up and around the house.  She is 87 years of age.  She lives alone.  She is unable to get in and out of her house by herself due to the pain in her back.  Patient also markedly hypertensive despite taking her normal home medications.  She has an MRI pending but has been unable to get it due to pacemaker.  I discussed the case with Dr. Hal Hope who will bring the patient in overnight for MRI in the morning.  Hopefully she will get a consult with neurosurgery due to her continued pain.  She may need PT OT eval.  Patient is otherwise hemodynamically stable.   Margarita Mail, PA-C 03/16/21 2322    Truddie Hidden, MD 03/17/21 [phone]

## 2021-03-16 NOTE — H&P (Signed)
History and Physical    [name] Small OAC:[id number] DOB: 09-Jun-1928 DOA: 03/16/2021  PCP: Billie Ruddy, MD  Patient coming from: Home.  Chief Complaint: Low back pain.  HPI: [name] Small is a 87 y.o. female with history of atrial fibrillation, sick sinus syndrome status post pacemaker placement, hypertension, chronic and disease stage III and hyperlipidemia had a fall and had gone to the ER on March 07, 2021 when patient x-rays showed an L2 compression fracture and was referred to the neurosurgeon.  Patient's pain was controlled with Tylenol.  But has been having recurrent pain finding it difficult to ambulate because of the pain had gone to the ER again.  Since patient's pain again renal good control came back to the ER today and plan is to get MRI.  Patient denies any incontinence of urine or bowel or radiating pain to the lower extremities.  Denies any fever chills.  ED Course: In the ER labs are largely unremarkable COVID test negative.  Pain is controlled with oxycodone.  Blood pressure is markedly elevated with systolic more than 010.  Was given Cozaar metoprolol in the ER.  Patient admitted for further management of intractable low back pain with L2 compression fracture needing MRI to further study.  Review of Systems: As per HPI, rest all negative.   Past Medical History:  Diagnosis Date   Atrial fibrillation ([place])    Bleeding stomach ulcer 1980s?   Cancer ([place]) Meloanoma left leg   DIVERTICULOSIS, COLON 11/22/2006   Headache(784.0)    "very often; not regular" (11/25/2015)   History of blood transfusion 1980s   "when I had the bleeding ulcer"   HYPERLIPIDEMIA 11/22/2006   Hypertension    Menopausal syndrome (hot flashes)    OSTEOPOROSIS 11/22/2006   Presence of permanent cardiac pacemaker     Past Surgical History:  Procedure Laterality Date   APPENDECTOMY     BUNIONECTOMY Right    CATARACT EXTRACTION W/ INTRAOCULAR LENS  IMPLANT, BILATERAL Bilateral 2000s    CHOLECYSTECTOMY OPEN     EP IMPLANTABLE DEVICE N/A 11/25/2015   Procedure: Pacemaker Implant;  Surgeon: Will Meredith Leeds, MD;  Location: [facility];  Service: Cardiovascular;  Laterality: N/A;   FEMUR IM NAIL Left 07/08/2014   Procedure: INTRAMEDULLARY (IM) NAIL ;  Surgeon: Meredith Pel, MD;  Location: [facility];  Service: Orthopedics;  Laterality: Left;   FRACTURE SURGERY     REVISION TOTAL HIP ARTHROPLASTY Left 06/2014     reports that she has never smoked. She has never used smokeless tobacco. She reports that she does not drink alcohol and does not use drugs.  Allergies  Allergen Reactions   Septra Ds [Sulfamethoxazole-Trimethoprim]    Lisinopril Other (See Comments)    Cough    Family History  Problem Relation Age of Onset   Alzheimer's disease Sister    Cardiomyopathy Brother     Prior to Admission medications   Medication Sig Start Date End Date Taking? Authorizing Provider  acetaminophen (TYLENOL) 500 MG tablet Take 1 tablet (500 mg total) by mouth every 6 (six) hours as needed. 03/07/21   Varney Biles, MD  acetaminophen (TYLENOL) 500 MG tablet Take 1 tablet (500 mg total) by mouth every 4 (four) hours as needed for moderate pain. 03/07/21   Varney Biles, MD  amiodarone (PACERONE) 200 MG tablet TAKE 1 TABLET(200 MG) BY MOUTH DAILY 02/07/21   Josue Hector, MD  atorvastatin (LIPITOR) 40 MG tablet TAKE 1 TABLET(40 MG)  BY MOUTH DAILY 02/11/21   Billie Ruddy, MD  calcitonin, salmon, (MIACALCIN) 200 UNIT/ACT nasal spray Place 1 spray into alternate nostrils daily. 03/11/21   Billie Ruddy, MD  Calcium Carbonate-Vitamin D (CALTRATE 600+D PO) Take 1 tablet by mouth daily.      [provider]  diclofenac sodium (VOLTAREN) 1 % GEL Apply 4 g topically 4 (four) times daily as needed. 01/25/18   Hilts, Legrand Como, MD  losartan (COZAAR) 50 MG tablet Take 1 tablet (50 mg total) by mouth daily. 08/08/16   Josue Hector, MD  metoprolol tartrate (LOPRESSOR) 25  MG tablet Take 1 tablet (25 mg total) by mouth 2 (two) times daily. 01/06/19   Camnitz, Ocie Doyne, MD  Polyvinyl Alcohol-Povidone (REFRESH OP) Place 1 drop into both eyes 2 (two) times daily.    [provider]  traMADol (ULTRAM) 50 MG tablet Take 1 tablet (50 mg total) by mouth every 12 (twelve) hours as needed. 05/06/20   Magnant, Charles L, PA-C  XARELTO 15 MG TABS tablet TAKE 1 TABLET(15 MG) BY MOUTH DAILY WITH DINNER 09/27/20   Josue Hector, MD    Physical Exam: Constitutional: Moderately built and nourished. Vitals:   03/16/21 1639 03/16/21 1704 03/16/21 1811 03/16/21 1947  BP: (!) 235/119 (!) 226/74 (!) 194/74 (!) 208/69  Pulse: 64 64 60 63  Resp: 18  18 17   Temp:    98.2 F (36.8 C)  TempSrc:    Oral  SpO2: 100%  100% 98%   Eyes: Anicteric no pallor. ENMT: No discharge from the ears eyes nose and mouth. Neck: No mass felt.  No neck rigidity. Respiratory: No rhonchi or crepitations. Cardiovascular: S1-S2 heard. Abdomen: Soft nontender bowel sound present. Musculoskeletal: No edema. Skin: No rash. Neurologic: Alert awake oriented to time place and person.  Moves all extremities. Psychiatric: Appears normal.  Normal affect.   Labs on Admission: I have personally reviewed following labs and imaging studies  CBC: Recent Labs  Lab 03/16/21 1219  WBC 8.7  HGB 12.9  HCT 40.5  MCV 94.4  PLT 326   Basic Metabolic Panel: Recent Labs  Lab 03/16/21 1219  NA 138  K 3.6  CL 105  CO2 23  GLUCOSE 101*  BUN 14  CREATININE 1.13*  CALCIUM 9.1   GFR: Estimated Creatinine Clearance: 28.4 mL/min (A) (by C-G formula based on SCr of 1.13 mg/dL (H)). Liver Function Tests: No results for input(s): AST, ALT, ALKPHOS, BILITOT, PROT, ALBUMIN in the last 168 hours. No results for input(s): LIPASE, AMYLASE in the last 168 hours. No results for input(s): AMMONIA in the last 168 hours. Coagulation Profile: No results for input(s): INR, PROTIME in the last 168  hours. Cardiac Enzymes: No results for input(s): CKTOTAL, CKMB, CKMBINDEX, TROPONINI in the last 168 hours. BNP (last 3 results) No results for input(s): PROBNP in the last 8760 hours. HbA1C: No results for input(s): HGBA1C in the last 72 hours. CBG: No results for input(s): GLUCAP in the last 168 hours. Lipid Profile: No results for input(s): CHOL, HDL, LDLCALC, TRIG, CHOLHDL, LDLDIRECT in the last 72 hours. Thyroid Function Tests: No results for input(s): TSH, T4TOTAL, FREET4, T3FREE, THYROIDAB in the last 72 hours. Anemia Panel: No results for input(s): VITAMINB12, FOLATE, FERRITIN, TIBC, IRON, RETICCTPCT in the last 72 hours. Urine analysis:    Component Value Date/Time   COLORURINE YELLOW 11/24/2015 0910   APPEARANCEUR CLOUDY (A) 11/24/2015 0910   LABSPEC 1.017 11/24/2015 0910   PHURINE 6.5 11/24/2015  [place] 11/24/2015 0910   HGBUR NEGATIVE 11/24/2015 0910   HGBUR negative 06/16/2008 0000   BILIRUBINUR negative 02/02/2020 1029   KETONESUR NEGATIVE 11/24/2015 0910   PROTEINUR Negative 02/02/2020 1029   PROTEINUR NEGATIVE 11/24/2015 0910   UROBILINOGEN 0.2 02/02/2020 1029   UROBILINOGEN 0.2 01/27/2015 1052   NITRITE negative 02/02/2020 1029   NITRITE NEGATIVE 11/24/2015 0910   LEUKOCYTESUR Small (1+) (A) 02/02/2020 1029   Sepsis Labs: @LABRCNTIP (procalcitonin:4,lacticidven:4) )No results found for this or any previous visit (from the past 240 hour(s)).   Radiological Exams on Admission: No results found.    Assessment/Plan Principal Problem:   Low back pain Active Problems:   Hypertensive urgency   PAF (paroxysmal atrial fibrillation) (HCC)    Low back pain with L2 compression fracture for which MRI has been ordered but has pacemaker will need technicians help with setting the MRI for the pacemaker.  We will have further plans based on MRI.  Continue pain relief medications. Hypertensive urgency could have been worsened because of the pain.   Patient did get ARB and metoprolol but patient states as per the report from the pharmacist patient does not take these medicines anymore.  We will keep patient on p.o. and IV hydralazine for blood pressure trends. A. fib on amiodarone.  Awaiting MRI results.  We will hold Xarelto and keep patient on heparin which patient agrees. Hyperlipidemia on statins. Chronic kidney disease stage III creatinine appears to be at baseline.   DVT prophylaxis: Heparin. Code Status: Full code. Family Communication: Discussed with patient. Disposition Plan: Home. Consults called: None. Admission status: Observation.   Rise Patience MD [facility] Pager [phone].  If 7PM-7AM, please contact night-coverage [email or web address] Password [facility]  03/16/2021, 10:47 PM

## 2021-03-16 NOTE — ED Provider Notes (Signed)
I provided a substantive portion of the care of this patient.  I personally performed the entirety of the history, exam, and medical decision making for this encounter.  Patient comes in by [facility] for back pain since a fall on October 17.  Patient taking Tylenol without relief.  She has an appointment with neurosurgery tomorrow.  Was having difficulty walking with her walker.  Patient was actually seen the day of the fall October 17 had x-rays of the lumbar back that showed an L2 compression fracture about 25%.  Patient seen again for persistent pain on October 19.  Did not have further imaging.  Patient was only taking Tylenol here to help with the pain.  Were thinking that with additional pain control should probably be able to ambulate with a walker but she is on Xarelto for atrial fib and she perceives that things may be worse.  So were going to go ahead and do MRI.  Patient has a pacemaker.  But she has had MRIs before so to MRI compatible.  We will do the MRI just to make sure there is no hematoma patient on exam does not have any physical findings of cauda equina.  She got good movement now with pain relief of both lower extremities.  Sensation is normal to the top and bottom of the feet.  No evidence of any sciatica here today.            Fredia Sorrow, MD 03/16/21 1454

## 2021-03-16 NOTE — Progress Notes (Signed)
ANTICOAGULATION CONSULT NOTE - Initial Consult  Pharmacy Consult for heparin (on PTA Xarelto) Indication: atrial fibrillation  Allergies  Allergen Reactions   Septra Ds [Sulfamethoxazole-Trimethoprim]    Lisinopril Other (See Comments)    Cough    Patient Measurements: Height: 5\' 5"  (165.1 cm) Weight: 54.4 kg (119 lb 14.9 oz) IBW/kg (Calculated) : 57 Heparin Dosing Weight: 54.4 kg  Vital Signs: Temp: 98.2 F (36.8 C) (10/26 1947) Temp Source: Oral (10/26 1947) BP: 208/69 (10/26 1947) Pulse Rate: 63 (10/26 1947)  Labs: Recent Labs    03/16/21 1219  HGB 12.9  HCT 40.5  PLT 242  CREATININE 1.13*    Estimated Creatinine Clearance: 28.4 mL/min (A) (by C-G formula based on SCr of 1.13 mg/dL (H)).   Medical History: Past Medical History:  Diagnosis Date   Atrial fibrillation ([place])    Bleeding stomach ulcer 1980s?   Cancer ([place]) Meloanoma left leg   DIVERTICULOSIS, COLON 11/22/2006   Headache(784.0)    "very often; not regular" (11/25/2015)   History of blood transfusion 1980s   "when I had the bleeding ulcer"   HYPERLIPIDEMIA 11/22/2006   Hypertension    Menopausal syndrome (hot flashes)    OSTEOPOROSIS 11/22/2006   Presence of permanent cardiac pacemaker     Medications:  see MAR  Assessment: 87 yo F with Afib (PTA Xarelto) - waiting on med rec to confirm last dose. Heparin consult for Afib AC. CBC wnl.   Goal of Therapy:  Heparin level 0.3-0.7 units/ml aPTT 66-102 seconds Monitor platelets by anticoagulation protocol: Yes   Plan: no heparin bolus given PTA AC - start heparin infusion at 750 units/hr F/u 8h aPTT and HL  Monitor daily labs and s/sx of bleeding  Joetta Manners, PharmD, [facility] Emergency Medicine Clinical Pharmacist ED RPh Phone: [place]: [phone]

## 2021-03-16 NOTE — ED Notes (Signed)
Pt son called and updated.

## 2021-03-16 NOTE — ED Notes (Signed)
Avarey Yaeger son [phone] requesting an update

## 2021-03-16 NOTE — ED Notes (Addendum)
Spoke with MRI tech in regards to pt's status. Pt won't be able to get MRI done until tomorrow morning. PA-Abigail made aware of situation, plan is to d/c pt with pain meds home to make tomorrow's appointment.

## 2021-03-16 NOTE — ED Notes (Signed)
Patients daughter in law states they cannot look after her at home. Requesting someone call Elenore Rota at [phone] to discuss

## 2021-03-16 NOTE — ED Provider Notes (Signed)
[place] EMERGENCY DEPARTMENT Provider Note   CSN: [id number] Arrival date & time:        History Chief Complaint  Patient presents with   Back Pain    [name] Small is a 87 y.o. female.  Patient with history of atrial fibrillation on anticoagulation, tachybradycardia syndrome status post Medtronic dual chamber pacemaker --presents the emergency department for evaluation of back pain.  Patient had a fall and was seen in the emergency department at [facility] on 10/17.  At that time she was diagnosed with a L2 compression fracture on x-ray.  She had a negative head CT.  Patient states that she lives at home independently.  Her son lives next door.  Since she fell she has been using a walker to help her ambulate.  She has been taking Tylenol for pain which helps mildly.  She has had increasing pain and worsening difficulty walking today, prompting emergency department visit.  Patient does note that she was able to get up and use the restroom with her walker this morning.  No urinary retention or incontinence.  No bowel incontinence.  She denies numbness, tingling or pain that radiates down into her legs.  Pain is worse over her sacral area.  No neck pain or upper extremity symptoms.  Blood pressure elevated on arrival.  She has a history of hypertension and takes losartan and metoprolol daily per her records.  Pain is worse with movement and is better in a lying position.      Past Medical History:  Diagnosis Date   Atrial fibrillation ([place])    Bleeding stomach ulcer 1980s?   Cancer ([place]) Meloanoma left leg   DIVERTICULOSIS, COLON 11/22/2006   Headache(784.0)    "very often; not regular" (11/25/2015)   History of blood transfusion 1980s   "when I had the bleeding ulcer"   HYPERLIPIDEMIA 11/22/2006   Hypertension    Menopausal syndrome (hot flashes)    OSTEOPOROSIS 11/22/2006   Presence of permanent cardiac pacemaker     Patient Active Problem List   Diagnosis Date  Noted   Porokeratosis 05/02/2019   Hav (hallux abducto valgus), unspecified laterality 05/02/2019   Hyperglycemia 12/07/2014   Atrial fibrillation with RVR-CHADs VASc=4 12/07/2014   Intertrochanteric fracture of left hip ([place]) 07/08/2014   Overactive bladder 04/02/2012   Essential hypertension 04/02/2012   Chest pain, atypical 09/17/2011   Dyslipidemia 11/22/2006   DIVERTICULOSIS, COLON 11/22/2006   Osteoporosis 11/22/2006    Past Surgical History:  Procedure Laterality Date   APPENDECTOMY     BUNIONECTOMY Right    CATARACT EXTRACTION W/ INTRAOCULAR LENS  IMPLANT, BILATERAL Bilateral 2000s   CHOLECYSTECTOMY OPEN     EP IMPLANTABLE DEVICE N/A 11/25/2015   Procedure: Pacemaker Implant;  Surgeon: Will Meredith Leeds, MD;  Location: [facility] LAB;  Service: Cardiovascular;  Laterality: N/A;   FEMUR IM NAIL Left 07/08/2014   Procedure: INTRAMEDULLARY (IM) NAIL ;  Surgeon: Meredith Pel, MD;  Location: [facility] ORS;  Service: Orthopedics;  Laterality: Left;   FRACTURE SURGERY     REVISION TOTAL HIP ARTHROPLASTY Left 06/2014     OB History   No obstetric history on file.     Family History  Problem Relation Age of Onset   Alzheimer's disease Sister    Cardiomyopathy Brother     Social History   Tobacco Use   Smoking status: Never   Smokeless tobacco: Never  Substance Use Topics   Alcohol use: No  Alcohol/week: 0.0 standard drinks   Drug use: No    Home Medications Prior to Admission medications   Medication Sig Start Date End Date Taking? Authorizing Provider  acetaminophen (TYLENOL) 500 MG tablet Take 1 tablet (500 mg total) by mouth every 6 (six) hours as needed. 03/07/21   Varney Biles, MD  acetaminophen (TYLENOL) 500 MG tablet Take 1 tablet (500 mg total) by mouth every 4 (four) hours as needed for moderate pain. 03/07/21   Varney Biles, MD  amiodarone (PACERONE) 200 MG tablet TAKE 1 TABLET(200 MG) BY MOUTH DAILY 02/07/21   Josue Hector, MD   atorvastatin (LIPITOR) 40 MG tablet TAKE 1 TABLET(40 MG) BY MOUTH DAILY 02/11/21   Billie Ruddy, MD  calcitonin, salmon, (MIACALCIN) 200 UNIT/ACT nasal spray Place 1 spray into alternate nostrils daily. 03/11/21   Billie Ruddy, MD  Calcium Carbonate-Vitamin D (CALTRATE 600+D PO) Take 1 tablet by mouth daily.      [provider]  diclofenac sodium (VOLTAREN) 1 % GEL Apply 4 g topically 4 (four) times daily as needed. 01/25/18   Hilts, Legrand Como, MD  losartan (COZAAR) 50 MG tablet Take 1 tablet (50 mg total) by mouth daily. 08/08/16   Josue Hector, MD  metoprolol tartrate (LOPRESSOR) 25 MG tablet Take 1 tablet (25 mg total) by mouth 2 (two) times daily. 01/06/19   Camnitz, Ocie Doyne, MD  Polyvinyl Alcohol-Povidone (REFRESH OP) Place 1 drop into both eyes 2 (two) times daily.    [provider]  traMADol (ULTRAM) 50 MG tablet Take 1 tablet (50 mg total) by mouth every 12 (twelve) hours as needed. 05/06/20   Magnant, Charles L, PA-C  XARELTO 15 MG TABS tablet TAKE 1 TABLET(15 MG) BY MOUTH DAILY WITH DINNER 09/27/20   Josue Hector, MD    Allergies    Septra ds [sulfamethoxazole-trimethoprim] and Lisinopril  Review of Systems   Review of Systems  Constitutional:  Negative for fever.  HENT:  Negative for rhinorrhea and sore throat.   Eyes:  Negative for redness.  Respiratory:  Negative for cough.   Cardiovascular:  Negative for chest pain.  Gastrointestinal:  Negative for abdominal pain, constipation, diarrhea, nausea, rectal pain and vomiting.  Genitourinary:  Positive for enuresis. Negative for hematuria.  Musculoskeletal:  Positive for back pain. Negative for myalgias.  Skin:  Negative for rash.  Neurological:  Negative for weakness, numbness and headaches.   Physical Exam Updated Vital Signs BP (S) (!) 211/106 (BP Location: Right Arm) Comment: provider aware  Pulse 67   Temp 97.7 F (36.5 C) (Oral)   Resp 19   SpO2 99%   Physical Exam Vitals and nursing  note reviewed.  Constitutional:      Appearance: She is well-developed.  HENT:     Head: Normocephalic and atraumatic.  Eyes:     Conjunctiva/sclera: Conjunctivae normal.  Pulmonary:     Effort: Pulmonary effort is normal.  Abdominal:     Palpations: Abdomen is soft.     Tenderness: There is no abdominal tenderness.  Musculoskeletal:        General: Normal range of motion.     Cervical back: Normal range of motion and neck supple.     Lumbar back: Tenderness and bony tenderness present.     Comments: No step-off noted with palpation of spine.  Pain over the lumbar spine, seems to be worse over the sacral area and lower L-spine.  Skin:    General: Skin is warm and dry.  Findings: No rash.  Neurological:     Mental Status: She is alert.     Sensory: No sensory deficit.     Deep Tendon Reflexes: Reflexes are normal and symmetric.     Comments: 5/5 strength in entire lower extremities bilaterally. No sensation deficit.     ED Results / Procedures / Treatments   Labs (all labs ordered are listed, but only abnormal results are displayed) Labs Reviewed  CBC  BASIC METABOLIC PANEL    EKG None  Radiology No results found.  Procedures Procedures   Medications Ordered in ED Medications  morphine 4 MG/ML injection 4 mg (4 mg Intravenous Given 03/16/21 1227)  ondansetron (ZOFRAN) injection 4 mg (4 mg Intravenous Given 03/16/21 1227)    ED Course  I have reviewed the triage vital signs and the nursing notes.  Pertinent labs & imaging results that were available during my care of the patient were reviewed by me and considered in my medical decision making (see chart for details).  Patient seen and examined.  Work-up initiated.  Discussed with Dr. Rogene Houston.  Patient has had MRI with her pacemaker in the past.  Would prefer imaging, but may need CT if unable to obtain.  We will attempt to control pain.  Hopefully this will improve blood pressure.  If not, will give dose of  labetalol.   Vital signs reviewed and are as follows: BP (S) (!) 211/106 (BP Location: Right Arm) Comment: provider aware  Pulse 67   Temp 97.7 F (36.5 C) (Oral)   Resp 19   SpO2 99%   1:51 PM BP improved to 173/70 with pain control.   3:11 PM patient discussed and seen with Dr. Rogene Houston.  She is currently stable --awaiting imaging.  Signout to [employer] at shift change.  If no emergent findings, anticipate that she can go home with additional pain control, help from family.  She should demonstrate her ability to ambulate with a walker prior to discharge.  She is due to follow-up with neurosurgery tomorrow per her report.    MDM Rules/Calculators/A&P                           Pending completion of work-up.    Final Clinical Impression(s) / ED Diagnoses Final diagnoses:  Acute midline low back pain without sciatica  Hypertension, unspecified type  Compression fracture of L2 vertebra with routine healing, subsequent encounter    Rx / DC Orders ED Discharge Orders     None        Carlisle Cater, PA-C 03/16/21 1512    Fredia Sorrow, MD 03/17/21 [phone]

## 2021-03-17 ENCOUNTER — Observation Stay (HOSPITAL_COMMUNITY): Payer: Medicare Other

## 2021-03-17 DIAGNOSIS — Z7901 Long term (current) use of anticoagulants: Secondary | ICD-10-CM | POA: Diagnosis not present

## 2021-03-17 DIAGNOSIS — I495 Sick sinus syndrome: Secondary | ICD-10-CM | POA: Diagnosis present

## 2021-03-17 DIAGNOSIS — I700 Atherosclerosis of aorta: Secondary | ICD-10-CM | POA: Diagnosis present

## 2021-03-17 DIAGNOSIS — W19XXXA Unspecified fall, initial encounter: Secondary | ICD-10-CM | POA: Diagnosis present

## 2021-03-17 DIAGNOSIS — Z79891 Long term (current) use of opiate analgesic: Secondary | ICD-10-CM | POA: Diagnosis not present

## 2021-03-17 DIAGNOSIS — I4821 Permanent atrial fibrillation: Secondary | ICD-10-CM | POA: Diagnosis present

## 2021-03-17 DIAGNOSIS — Z9181 History of falling: Secondary | ICD-10-CM | POA: Diagnosis not present

## 2021-03-17 DIAGNOSIS — S32020D Wedge compression fracture of second lumbar vertebra, subsequent encounter for fracture with routine healing: Secondary | ICD-10-CM | POA: Diagnosis not present

## 2021-03-17 DIAGNOSIS — M545 Low back pain, unspecified: Secondary | ICD-10-CM | POA: Diagnosis present

## 2021-03-17 DIAGNOSIS — S32020A Wedge compression fracture of second lumbar vertebra, initial encounter for closed fracture: Secondary | ICD-10-CM | POA: Diagnosis not present

## 2021-03-17 DIAGNOSIS — I129 Hypertensive chronic kidney disease with stage 1 through stage 4 chronic kidney disease, or unspecified chronic kidney disease: Secondary | ICD-10-CM | POA: Diagnosis present

## 2021-03-17 DIAGNOSIS — Z79899 Other long term (current) drug therapy: Secondary | ICD-10-CM | POA: Diagnosis not present

## 2021-03-17 DIAGNOSIS — I10 Essential (primary) hypertension: Secondary | ICD-10-CM

## 2021-03-17 DIAGNOSIS — N1831 Chronic kidney disease, stage 3a: Secondary | ICD-10-CM | POA: Diagnosis present

## 2021-03-17 DIAGNOSIS — Z23 Encounter for immunization: Secondary | ICD-10-CM | POA: Diagnosis present

## 2021-03-17 DIAGNOSIS — Z888 Allergy status to other drugs, medicaments and biological substances status: Secondary | ICD-10-CM | POA: Diagnosis not present

## 2021-03-17 DIAGNOSIS — Z20822 Contact with and (suspected) exposure to covid-19: Secondary | ICD-10-CM | POA: Diagnosis present

## 2021-03-17 DIAGNOSIS — I160 Hypertensive urgency: Secondary | ICD-10-CM | POA: Diagnosis present

## 2021-03-17 DIAGNOSIS — Z961 Presence of intraocular lens: Secondary | ICD-10-CM | POA: Diagnosis present

## 2021-03-17 DIAGNOSIS — M8008XA Age-related osteoporosis with current pathological fracture, vertebra(e), initial encounter for fracture: Secondary | ICD-10-CM | POA: Diagnosis present

## 2021-03-17 DIAGNOSIS — E785 Hyperlipidemia, unspecified: Secondary | ICD-10-CM

## 2021-03-17 DIAGNOSIS — S32000A Wedge compression fracture of unspecified lumbar vertebra, initial encounter for closed fracture: Secondary | ICD-10-CM | POA: Diagnosis present

## 2021-03-17 DIAGNOSIS — Z950 Presence of cardiac pacemaker: Secondary | ICD-10-CM | POA: Diagnosis not present

## 2021-03-17 DIAGNOSIS — Z96642 Presence of left artificial hip joint: Secondary | ICD-10-CM | POA: Diagnosis present

## 2021-03-17 DIAGNOSIS — I480 Paroxysmal atrial fibrillation: Secondary | ICD-10-CM | POA: Diagnosis not present

## 2021-03-17 DIAGNOSIS — Z9049 Acquired absence of other specified parts of digestive tract: Secondary | ICD-10-CM | POA: Diagnosis not present

## 2021-03-17 DIAGNOSIS — S32028A Other fracture of second lumbar vertebra, initial encounter for closed fracture: Secondary | ICD-10-CM | POA: Diagnosis present

## 2021-03-17 LAB — BASIC METABOLIC PANEL
Anion gap: 10 (ref 5–15)
BUN: 15 mg/dL (ref 8–23)
CO2: 22 mmol/L (ref 22–32)
Calcium: 9.4 mg/dL (ref 8.9–10.3)
Chloride: 104 mmol/L (ref 98–111)
Creatinine, Ser: 1.2 mg/dL — ABNORMAL HIGH (ref 0.44–1.00)
GFR, Estimated: 43 mL/min — ABNORMAL LOW (ref >60)
Glucose, Bld: 103 mg/dL — ABNORMAL HIGH (ref 70–99)
Potassium: 3.6 mmol/L (ref 3.5–5.1)
Sodium: 136 mmol/L (ref 135–145)

## 2021-03-17 LAB — CBC
HCT: 40.9 % (ref 36.0–46.0)
Hemoglobin: 13.5 g/dL (ref 12.0–15.0)
MCH: 31 pg (ref 26.0–34.0)
MCHC: 33 g/dL (ref 30.0–36.0)
MCV: 93.8 fL (ref 80.0–100.0)
Platelets: 261 10*3/uL (ref 150–400)
RBC: 4.36 MIL/uL (ref 3.87–5.11)
RDW: 13.1 % (ref 11.5–15.5)
WBC: 9.2 10*3/uL (ref 4.0–10.5)
nRBC: 0 % (ref 0.0–0.2)

## 2021-03-17 LAB — APTT
aPTT: 69 seconds — ABNORMAL HIGH (ref 24–36)
aPTT: 101 seconds — ABNORMAL HIGH (ref 24–36)
aPTT: 79 seconds — ABNORMAL HIGH (ref 24–36)

## 2021-03-17 LAB — RESP PANEL BY RT-PCR (FLU A&B, COVID) ARPGX2
Influenza A by PCR: NEGATIVE
Influenza B by PCR: NEGATIVE
SARS Coronavirus 2 by RT PCR: NEGATIVE

## 2021-03-17 LAB — PROTIME-INR
INR: 1.1 (ref 0.8–1.2)
Prothrombin Time: 14 seconds (ref 11.4–15.2)

## 2021-03-17 LAB — HEPARIN LEVEL (UNFRACTIONATED)
Heparin Unfractionated: 0.49 IU/mL (ref 0.30–0.70)
Heparin Unfractionated: 0.75 IU/mL — ABNORMAL HIGH (ref 0.30–0.70)
Heparin Unfractionated: 0.58 IU/mL (ref 0.30–0.70)

## 2021-03-17 MED ORDER — HYDROMORPHONE HCL 1 MG/ML IJ SOLN: 0.5000 mg | INTRAMUSCULAR | Status: DC | PRN

## 2021-03-17 MED ORDER — DICLOFENAC SODIUM 1 % EX GEL
2.0000 g | Freq: Four times a day (QID) | CUTANEOUS | Status: DC
  Administered 2021-03-17 – 2021-03-22 (×12): 2 g via TOPICAL
  Filled 2021-03-17: qty 100

## 2021-03-17 MED ORDER — ACETAMINOPHEN 325 MG PO TABS
650.0000 mg | ORAL_TABLET | Freq: Four times a day (QID) | ORAL | Status: DC
  Administered 2021-03-17 – 2021-03-22 (×14): 650 mg via ORAL
  Filled 2021-03-17 (×18): qty 2

## 2021-03-17 MED ORDER — PANTOPRAZOLE SODIUM 40 MG PO TBEC
40.0000 mg | DELAYED_RELEASE_TABLET | Freq: Every day | ORAL | Status: DC
  Administered 2021-03-17 – 2021-03-23 (×7): 40 mg via ORAL
  Filled 2021-03-17 (×7): qty 1

## 2021-03-17 NOTE — Progress Notes (Signed)
ANTICOAGULATION CONSULT NOTE - Follow-Up Consult  Pharmacy Consult for heparin (on PTA Xarelto) Indication: atrial fibrillation  Allergies  Allergen Reactions   Septra Ds [Sulfamethoxazole-Trimethoprim]    Lisinopril Other (See Comments)    Cough    Patient Measurements: Height: 5\' 5"  (165.1 cm) Weight: 54.4 kg (119 lb 14.9 oz) IBW/kg (Calculated) : 57 Heparin Dosing Weight: 54.4 kg  Vital Signs: Temp: 98.5 F (36.9 C) (10/27 1921) Temp Source: Oral (10/27 1921) BP: 160/91 (10/27 1921) Pulse Rate: 61 (10/27 1921)  Labs: Recent Labs    03/16/21 1219 03/17/21 0543 03/17/21 0943 03/17/21 1756  HGB 12.9 13.5  --   --   HCT 40.5 40.9  --   --   PLT 242 261  --   --   APTT  --  69* 101* 79*  LABPROT  --  14.0  --   --   INR  --  1.1  --   --   HEPARINUNFRC  --  0.49 0.75*  --   CREATININE 1.13* 1.20*  --   --      Estimated Creatinine Clearance: 26.8 mL/min (A) (by C-G formula based on SCr of 1.2 mg/dL (H)).   Medical History: Past Medical History:  Diagnosis Date   Atrial fibrillation ([place])    Bleeding stomach ulcer 1980s?   Cancer ([place]) Meloanoma left leg   DIVERTICULOSIS, COLON 11/22/2006   Headache(784.0)    "very often; not regular" (11/25/2015)   History of blood transfusion 1980s   "when I had the bleeding ulcer"   HYPERLIPIDEMIA 11/22/2006   Hypertension    Menopausal syndrome (hot flashes)    OSTEOPOROSIS 11/22/2006   Presence of permanent cardiac pacemaker     Medications:  see MAR  Assessment:  89 YOF on Xarelto PTA for hx Afib - held while awaiting MRI for compression fracture in case surgery is warranted. Pharmacy consulted to bridge with Heparin.   Last Xarelto dose on 10/25, which may create false elevation in the initial heparin levels.  PM update: aPTT therapeutic at 79, heparin level 0.58 - starting to correlate. CBC wnl. No active bleed issues reported.  Goal of Therapy:  Heparin level 0.3-0.7 units/ml aPTT 66-102 seconds Monitor  platelets by anticoagulation protocol: Yes   Plan:  Continue heparin at 750 units/hr Confirmatory aPTT/heparin level with AM labs Monitor daily heparin level and aPTT until correlating, CBC, s/sx bleeding   Arturo Morton, PharmD, BCPS Please check AMION for all [place] contact numbers Clinical Pharmacist 03/17/2021 7:36 PM

## 2021-03-17 NOTE — Progress Notes (Signed)
ANTICOAGULATION CONSULT NOTE - Follow-Up Consult  Pharmacy Consult for heparin (on PTA Xarelto) Indication: atrial fibrillation  Allergies  Allergen Reactions   Septra Ds [Sulfamethoxazole-Trimethoprim]    Lisinopril Other (See Comments)    Cough    Patient Measurements: Height: 5\' 5"  (165.1 cm) Weight: 54.4 kg (119 lb 14.9 oz) IBW/kg (Calculated) : 57 Heparin Dosing Weight: 54.4 kg  Vital Signs: Temp: 98.6 F (37 C) (10/27 0713) Temp Source: Oral (10/27 0713) BP: 110/58 (10/27 0713) Pulse Rate: 60 (10/27 0713)  Labs: Recent Labs    03/16/21 1219 03/17/21 0543  HGB 12.9 13.5  HCT 40.5 40.9  PLT 242 261  APTT  --  69*  LABPROT  --  14.0  INR  --  1.1  HEPARINUNFRC  --  0.49  CREATININE 1.13* 1.20*     Estimated Creatinine Clearance: 26.8 mL/min (A) (by C-G formula based on SCr of 1.2 mg/dL (H)).   Medical History: Past Medical History:  Diagnosis Date   Atrial fibrillation ([place])    Bleeding stomach ulcer 1980s?   Cancer ([place]) Meloanoma left leg   DIVERTICULOSIS, COLON 11/22/2006   Headache(784.0)    "very often; not regular" (11/25/2015)   History of blood transfusion 1980s   "when I had the bleeding ulcer"   HYPERLIPIDEMIA 11/22/2006   Hypertension    Menopausal syndrome (hot flashes)    OSTEOPOROSIS 11/22/2006   Presence of permanent cardiac pacemaker     Medications:  see MAR  Assessment:  89 YOF on Xarelto PTA for hx Afib - held while awaiting MRI for compression fracture in case surgery is warranted. Pharmacy consulted to bridge with Heparin.   Last Xarelto dose on 10/25, which may create false elevation in the initial heparin levels  Labs were drawn early this morning (prior to steady state) - show aPTT 69 (therapeutic on low end) and later in the morning at steady state also therapeutic at 101 (on the higher end). The second level appears to have been drawn from the same arm above the site so likely some false elevation there. Will continue at the  current rate and recheck levels at 1800 from the opposite arm.  Goal of Therapy:  Heparin level 0.3-0.7 units/ml aPTT 66-102 seconds Monitor platelets by anticoagulation protocol: Yes   Plan:  - Continue Heparin at 750 units/hr (7.5 ml/hr) - Will continue to monitor for any signs/symptoms of bleeding and will follow up with aPTT/HL level in 8 hours - RN to ensure drawn from opposite arm.  Thank you for allowing pharmacy to be a part of this patient's care.  Alycia Rossetti, PharmD, BCPS Clinical Pharmacist Clinical phone for 03/17/2021: [phone] 03/17/2021 1:33 PM   **Pharmacist phone directory can now be found on [email or web address] ([email or web address]).  Listed under [place].

## 2021-03-17 NOTE — Progress Notes (Addendum)
PROGRESS NOTE    [name] Small  QQI:[id number] DOB: 1928-09-18 DOA: 03/16/2021 PCP: [name] Ruddy, MD    Brief Narrative:  [name] Small was admitted to the hospital with the working diagnosis of L2 compression fracture.   87 year old female past medical history for atrial fibrillation, sick sinus syndrome status post pacemaker, hypertension, chronic kidney disease stage III and dyslipidemia who presented with low back pain.  Patient sustained a fall in March 07, 2021, she was diagnosed with L2 compression fracture and was referred for outpatient follow-up with neurosurgery.  Pain control with acetaminophen.  At home her pain was persistent and worsening, having difficulty ambulating that prompted her to come back to the hospital.  On her initial physical examination blood pressure 235/119, heart rate 64, respiratory rate 18, temperature 98.2, oxygen saturation 98%, her lungs are clear to auscultation bilaterally, heart S1-S2, present, rhythmic, soft abdomen, no lower extremity edema.  Sodium 138, potassium 3.6, chloride 105, bicarb 23, glucose 101, BUN 14, creatinine 1.13, white count 8.7, hemoglobin 12.9, hematocrit 40.5, platelets 242. SARS COVID-19 negative.   Assessment & Plan:   Principal Problem:   Compression fracture of L2 (HCC) Active Problems:   Dyslipidemia   Osteoporosis   Low back pain   Hypertensive urgency   PAF (paroxysmal atrial fibrillation) (HCC)   L2 compression fracture.  Patient continue to have back pain, worse with movement, not yet back to baseline. Continue to have difficulty ambulating due to severe pain.   Spine MRI with acute/recent L2 superior endplate fracture with 94% height loss, 5-6 mm of bony retropulsion and involvement of bilateral pedicles, resulting in mild canal stenosis.   Plan to continue pain control with oxycodone and IV hydromorphone.  Scheduled acetaminophen and topical diclofenac.  I will consult spine surgery on recommendations,  possible candidate for kyphoplasty.   2. HTN urgency/ dyslipidemia. Continue blood pressure control with metoprolol. Blood pressure this am 142/79 mmHg.  Continue with atorvastatin.   3. Paroxysmal  atrial fibrillation. Continue rate control with metoprolol, amiodarone. Continue with IV heparin for now until final recommendations from Neurosurgery, at home patient on rivaroxaban.   Patient has a pacer in place.   4. CKD stage 3a renal function with serum cr at 1,20 with K at 3,6 and serum bicarbonate at 22. Continue close follow up on renal function and electrolytes, avoid hypotension or nephrotoxic medications.       Patient continue to be at high risk for worsening back pain   Status is: Observation  The patient remains OBS appropriate and will d/c before 2 midnights.  DVT prophylaxis: Heparin   Code Status:    full  Family Communication:  I spoke with patient's son at the bedside, we talked in detail about patient's condition, plan of care and prognosis and all questions were addressed.     Subjective: Patient with persistent pain in her lower back, continue to have difficulty ambulating, not back to her baseline   Objective: Vitals:   03/17/21 0514 03/17/21 0613 03/17/21 0641 03/17/21 0713  BP: (!) 174/90 (!) 178/62 (!) 111/53 (!) 110/58  Pulse: 60 60 60 60  Resp: 17 13 18 13   Temp: 98.6 F (37 C) 98.6 F (37 C)  98.6 F (37 C)  TempSrc: Oral Oral  Oral  SpO2: 100% 98% 97% 98%  Weight:      Height:        Intake/Output Summary (Last 24 hours) at 03/17/2021 1157 Last data filed at 03/17/2021 0800 Gross per  24 hour  Intake 287.12 ml  Output --  Net 287.12 ml   Filed Weights   03/16/21 2300  Weight: 54.4 kg    Examination:   General: Not in pain or dyspnea. Deconditioned  Neurology: Awake and alert, non focal  E ENT: no pallor, no icterus, oral mucosa moist Cardiovascular: No JVD. S1-S2 present, rhythmic, no gallops, rubs, or murmurs. No lower  extremity edema. Pulmonary: positve breath sounds bilaterally, adequate air movement, no wheezing, rhonchi or rales. Gastrointestinal. Abdomen soft and non tender Skin. No rashes Musculoskeletal: no joint deformities     Data Reviewed: I have personally reviewed following labs and imaging studies  CBC: Recent Labs  Lab 03/16/21 1219 03/17/21 0543  WBC 8.7 9.2  HGB 12.9 13.5  HCT 40.5 40.9  MCV 94.4 93.8  PLT 242 035   Basic Metabolic Panel: Recent Labs  Lab 03/16/21 1219 03/17/21 0543  NA 138 136  K 3.6 3.6  CL 105 104  CO2 23 22  GLUCOSE 101* 103*  BUN 14 15  CREATININE 1.13* 1.20*  CALCIUM 9.1 9.4   GFR: Estimated Creatinine Clearance: 26.8 mL/min (A) (by C-G formula based on SCr of 1.2 mg/dL (H)). Liver Function Tests: No results for input(s): AST, ALT, ALKPHOS, BILITOT, PROT, ALBUMIN in the last 168 hours. No results for input(s): LIPASE, AMYLASE in the last 168 hours. No results for input(s): AMMONIA in the last 168 hours. Coagulation Profile: Recent Labs  Lab 03/17/21 0543  INR 1.1   Cardiac Enzymes: No results for input(s): CKTOTAL, CKMB, CKMBINDEX, TROPONINI in the last 168 hours. BNP (last 3 results) No results for input(s): PROBNP in the last 8760 hours. HbA1C: No results for input(s): HGBA1C in the last 72 hours. CBG: No results for input(s): GLUCAP in the last 168 hours. Lipid Profile: No results for input(s): CHOL, HDL, LDLCALC, TRIG, CHOLHDL, LDLDIRECT in the last 72 hours. Thyroid Function Tests: No results for input(s): TSH, T4TOTAL, FREET4, T3FREE, THYROIDAB in the last 72 hours. Anemia Panel: No results for input(s): VITAMINB12, FOLATE, FERRITIN, TIBC, IRON, RETICCTPCT in the last 72 hours.    Radiology Studies: I have reviewed all of the imaging during this hospital visit personally     Scheduled Meds:  amiodarone  200 mg Oral Daily   atorvastatin  40 mg Oral Daily   calcitonin (salmon)  1 spray Alternating Nares Daily    docusate sodium  100 mg Oral BID   oxyCODONE-acetaminophen  1 tablet Oral Once   Continuous Infusions:  heparin 750 Units/hr (03/17/21 0135)     LOS: 0 days        [name] Kanaan Gerome Apley, MD

## 2021-03-17 NOTE — Progress Notes (Signed)
Received report from Marty, [place] in the ED.

## 2021-03-17 NOTE — Progress Notes (Signed)
Patient from [place] to MRI for scan. Patient has medtronic device. CLE sent. Orders received for DOO 80. Will re-program once scan is completed.

## 2021-03-18 DIAGNOSIS — S32020A Wedge compression fracture of second lumbar vertebra, initial encounter for closed fracture: Secondary | ICD-10-CM | POA: Diagnosis not present

## 2021-03-18 LAB — BASIC METABOLIC PANEL
Anion gap: 7 (ref 5–15)
BUN: 21 mg/dL (ref 8–23)
CO2: 26 mmol/L (ref 22–32)
Calcium: 9.3 mg/dL (ref 8.9–10.3)
Chloride: 103 mmol/L (ref 98–111)
Creatinine, Ser: 1.22 mg/dL — ABNORMAL HIGH (ref 0.44–1.00)
GFR, Estimated: 42 mL/min — ABNORMAL LOW (ref >60)
Glucose, Bld: 120 mg/dL — ABNORMAL HIGH (ref 70–99)
Potassium: 3.7 mmol/L (ref 3.5–5.1)
Sodium: 136 mmol/L (ref 135–145)

## 2021-03-18 LAB — CBC
HCT: 39.2 % (ref 36.0–46.0)
Hemoglobin: 12.7 g/dL (ref 12.0–15.0)
MCH: 30 pg (ref 26.0–34.0)
MCHC: 32.4 g/dL (ref 30.0–36.0)
MCV: 92.7 fL (ref 80.0–100.0)
Platelets: 255 10*3/uL (ref 150–400)
RBC: 4.23 MIL/uL (ref 3.87–5.11)
RDW: 13.2 % (ref 11.5–15.5)
WBC: 10 10*3/uL (ref 4.0–10.5)
nRBC: 0 % (ref 0.0–0.2)

## 2021-03-18 LAB — APTT
aPTT: 110 seconds — ABNORMAL HIGH (ref 24–36)
aPTT: 112 seconds — ABNORMAL HIGH (ref 24–36)

## 2021-03-18 LAB — HEPARIN LEVEL (UNFRACTIONATED): Heparin Unfractionated: 0.84 IU/mL — ABNORMAL HIGH (ref 0.30–0.70)

## 2021-03-18 MED ORDER — HEPARIN (PORCINE) 25000 UT/250ML-% IV SOLN
550.0000 [IU]/h | INTRAVENOUS | Status: DC
  Administered 2021-03-18: 550 [IU]/h via INTRAVENOUS
  Filled 2021-03-18: qty 250

## 2021-03-18 NOTE — Progress Notes (Addendum)
ANTICOAGULATION CONSULT NOTE - Follow-Up Consult  Pharmacy Consult for heparin (on PTA Xarelto) Indication: atrial fibrillation  Allergies  Allergen Reactions   Septra Ds [Sulfamethoxazole-Trimethoprim]    Lisinopril Other (See Comments)    Cough    Patient Measurements: Height: 5\' 5"  (165.1 cm) Weight: 54.4 kg (119 lb 14.9 oz) IBW/kg (Calculated) : 57 Heparin Dosing Weight: 54.4 kg  Vital Signs: Temp: 97.9 F (36.6 C) (10/28 1124) Temp Source: Oral (10/28 1124) BP: 135/61 (10/28 1124) Pulse Rate: 65 (10/28 1124)  Labs: Recent Labs    03/16/21 1219 03/16/21 1219 03/17/21 0543 03/17/21 0943 03/17/21 1756 03/18/21 0333 03/18/21 1247  HGB 12.9  --  13.5  --   --  12.7  --   HCT 40.5  --  40.9  --   --  39.2  --   PLT 242  --  261  --   --  255  --   APTT  --    < > 69* 101* 79* 110* 112*  LABPROT  --   --  14.0  --   --   --   --   INR  --   --  1.1  --   --   --   --   HEPARINUNFRC  --    < > 0.49 0.75* 0.58 0.84*  --   CREATININE 1.13*  --  1.20*  --   --  1.22*  --    < > = values in this interval not displayed.     Estimated Creatinine Clearance: 26.3 mL/min (A) (by C-G formula based on SCr of 1.22 mg/dL (H)).   Medical History: Past Medical History:  Diagnosis Date   Atrial fibrillation ([place])    Bleeding stomach ulcer 1980s?   Cancer ([place]) Meloanoma left leg   DIVERTICULOSIS, COLON 11/22/2006   Headache(784.0)    "very often; not regular" (11/25/2015)   History of blood transfusion 1980s   "when I had the bleeding ulcer"   HYPERLIPIDEMIA 11/22/2006   Hypertension    Menopausal syndrome (hot flashes)    OSTEOPOROSIS 11/22/2006   Presence of permanent cardiac pacemaker     Medications:  see MAR  Assessment:  89 YOF on Xarelto PTA for hx Afib - held while awaiting MRI for compression fracture in case surgery is warranted. Pharmacy consulted to bridge with Heparin.   Last Xarelto dose on 10/25, which may create false elevation in the initial heparin  levels.  aPTT came back elevated at 112, on 650 units/hr of heparin. Hgb 12.7, plt 255. No s/sx of bleeding or infusion issues - confirmed with nursing drawn on opposite side.   Goal of Therapy:  Heparin level 0.3-0.7 units/ml aPTT 66-102 seconds Monitor platelets by anticoagulation protocol: Yes   Plan:  Hold infusion for 1 hour Will reduce rate down to 550 units/hr upon restart  Get level in 8 hours after restart  Monitor daily heparin level and aPTT until correlating, CBC, s/sx bleeding  Antonietta Jewel, PharmD, [place] Clinical Pharmacist  Phone: [phone] 03/18/2021 4:12 PM  Please check AMION for all [place] phone numbers After 10:00 PM, call [place] [phone]

## 2021-03-18 NOTE — Progress Notes (Signed)
PROGRESS NOTE    [name] Small  VWU:[id number] DOB: [birth year] DOA: 03/16/2021 PCP: Billie Ruddy, MD    Brief Narrative:  87 year old with history of chronic A. fib, sick sinus syndrome status post permanent pacemaker, hypertension, CKD stage IIIa and dyslipidemia brought to the hospital with unrelenting pain after a fall at home 10 days ago.  Using opiates without pain relief.  In the emergency room hypertensive otherwise hemodynamically stable.  MRI consistent withL2 compression fracture with more than 50% height loss.  Admitted for symptom management.   Assessment & Plan:   Principal Problem:   Compression fracture of L2 (HCC) Active Problems:   Dyslipidemia   Osteoporosis   Low back pain   Hypertensive urgency   PAF (paroxysmal atrial fibrillation) (HCC)   Lumbar compression fracture (HCC)   Hypertension  Close traumatic vertebral fracture with unrelenting pain, compression fracture: MRI consistent with superior endplate fracture with more than 50% height loss.  Continues to have pain. Continue Tylenol, oxycodone along with bowel regimen as well IV pain medications to control her pain. Start mobilizing.  Work with PT OT.  Stable spinal fracture, no precautions needed. Patient will benefit with kyphoplasty, case discussed with interventional radiology and will be seen today.  We will keep n.p.o. in case patient can have procedure today.  Hypertensive urgency Dyslipidemia CKD stage IIIa Is stable.  At about her baseline.  Paroxysmal A. fib: Permanent pacemaker present.  Currently in sinus rhythm.  On amiodarone and metoprolol.  Was on Xarelto at home.  Discontinued since admission.  Currently remains on heparin infusion.  We will keep on heparin perioperative.   DVT prophylaxis:   Heparin infusion   Code Status: Full code Family Communication: Son on the phone Disposition Plan: Status is: Inpatient  Remains inpatient appropriate because: Persistent pain.  In need for  inpatient procedure.         Consultants:  Interventional radiology  Procedures:  None  Antimicrobials:  None   Subjective: Patient seen and examined.  She tells me that she received a dose of Tylenol today morning and was not hurting anymore.  She has not mobilized out of the bed.  It hurts to move.     Objective: Vitals:   03/17/21 2121 03/17/21 2356 03/18/21 0353 03/18/21 0741  BP: (!) 150/100 128/60 135/62 (!) 135/56  Pulse: 60 62 60 60  Resp: 13 17 14 15   Temp:  98 F (36.7 C) 97.9 F (36.6 C) 98 F (36.7 C)  TempSrc:  Oral Oral Oral  SpO2: 99% 100% 98% 96%  Weight:      Height:        Intake/Output Summary (Last 24 hours) at 03/18/2021 1037 Last data filed at 03/18/2021 0300 Gross per 24 hour  Intake 494.55 ml  Output 500 ml  Net -5.45 ml   Filed Weights   03/16/21 2300  Weight: 54.4 kg    Examination:  General: Looks fairly comfortable.  She is alert and oriented x2-3.  Frail debilitated and deconditioned. Cardiovascular: S1-S2 normal.  Regular rate rhythm.  Patient has pacemaker in place. Respiratory: Bilateral clear.  No added sounds. Gastrointestinal: S1-S2 normal.  Soft nontender. Ext: No swelling or edema. Neuro: No neurological deficits.     Data Reviewed: I have personally reviewed following labs and imaging studies  CBC: Recent Labs  Lab 03/16/21 1219 03/17/21 0543 03/18/21 0333  WBC 8.7 9.2 10.0  HGB 12.9 13.5 12.7  HCT 40.5 40.9 39.2  MCV 94.4 93.8 92.7  PLT 242 261 947   Basic Metabolic Panel: Recent Labs  Lab 03/16/21 1219 03/17/21 0543 03/18/21 0333  NA 138 136 136  K 3.6 3.6 3.7  CL 105 104 103  CO2 23 22 26   GLUCOSE 101* 103* 120*  BUN 14 15 21   CREATININE 1.13* 1.20* 1.22*  CALCIUM 9.1 9.4 9.3   GFR: Estimated Creatinine Clearance: 26.3 mL/min (A) (by C-G formula based on SCr of 1.22 mg/dL (H)). Liver Function Tests: No results for input(s): AST, ALT, ALKPHOS, BILITOT, PROT, ALBUMIN in the last 168  hours. No results for input(s): LIPASE, AMYLASE in the last 168 hours. No results for input(s): AMMONIA in the last 168 hours. Coagulation Profile: Recent Labs  Lab 03/17/21 0543  INR 1.1   Cardiac Enzymes: No results for input(s): CKTOTAL, CKMB, CKMBINDEX, TROPONINI in the last 168 hours. BNP (last 3 results) No results for input(s): PROBNP in the last 8760 hours. HbA1C: No results for input(s): HGBA1C in the last 72 hours. CBG: No results for input(s): GLUCAP in the last 168 hours. Lipid Profile: No results for input(s): CHOL, HDL, LDLCALC, TRIG, CHOLHDL, LDLDIRECT in the last 72 hours. Thyroid Function Tests: No results for input(s): TSH, T4TOTAL, FREET4, T3FREE, THYROIDAB in the last 72 hours. Anemia Panel: No results for input(s): VITAMINB12, FOLATE, FERRITIN, TIBC, IRON, RETICCTPCT in the last 72 hours. Sepsis Labs: No results for input(s): PROCALCITON, LATICACIDVEN in the last 168 hours.  Recent Results (from the past 240 hour(s))  Resp Panel by RT-PCR (Flu A&B, Covid) Nasopharyngeal Swab     Status: None   Collection Time: 03/17/21 12:38 AM   Specimen: Nasopharyngeal Swab; Nasopharyngeal(NP) swabs in vial transport medium  Result Value Ref Range Status   SARS Coronavirus 2 by RT PCR NEGATIVE NEGATIVE Final    Comment: (NOTE) SARS-CoV-2 target nucleic acids are NOT DETECTED.  The SARS-CoV-2 RNA is generally detectable in upper respiratory specimens during the acute phase of infection. The lowest concentration of SARS-CoV-2 viral copies this assay can detect is 138 copies/mL. A negative result does not preclude SARS-Cov-2 infection and should not be used as the sole basis for treatment or other patient management decisions. A negative result may occur with  improper specimen collection/handling, submission of specimen other than nasopharyngeal swab, presence of viral mutation(s) within the areas targeted by this assay, and inadequate number of viral copies(<138  copies/mL). A negative result must be combined with clinical observations, patient history, and epidemiological information. The expected result is Negative.  Fact Sheet for Patients:  EntrepreneurPulse.com.au  Fact Sheet for Healthcare Providers:  IncredibleEmployment.be  This test is no t yet approved or cleared by the [place] FDA and  has been authorized for detection and/or diagnosis of SARS-CoV-2 by FDA under an Emergency Use Authorization (EUA). This EUA will remain  in effect (meaning this test can be used) for the duration of the COVID-19 declaration under Section 564(b)(1) of the Act, 21 U.S.C.section 360bbb-3(b)(1), unless the authorization is terminated  or revoked sooner.       Influenza A by PCR NEGATIVE NEGATIVE Final   Influenza B by PCR NEGATIVE NEGATIVE Final    Comment: (NOTE) The Xpert Xpress SARS-CoV-2/FLU/RSV plus assay is intended as an aid in the diagnosis of influenza from Nasopharyngeal swab specimens and should not be used as a sole basis for treatment. Nasal washings and aspirates are unacceptable for Xpert Xpress SARS-CoV-2/FLU/RSV testing.  Fact Sheet for Patients: EntrepreneurPulse.com.au  Fact Sheet for Healthcare Providers: IncredibleEmployment.be  This test is  not yet approved or cleared by the [place] and has been authorized for detection and/or diagnosis of SARS-CoV-2 by FDA under an Emergency Use Authorization (EUA). This EUA will remain in effect (meaning this test can be used) for the duration of the COVID-19 declaration under Section 564(b)(1) of the Act, 21 U.S.C. section 360bbb-3(b)(1), unless the authorization is terminated or revoked.  Performed at [facility], [place] [address] [place], [place] [address]          Radiology Studies: MR LUMBAR SPINE WO CONTRAST  Result Date: 03/17/2021 CLINICAL DATA:  Spine fracture, lumbosacral,  traumatic worsening lower lumbar and sacral pain after fall 10 days ago, is anticoagulated EXAM: MRI LUMBAR SPINE WITHOUT CONTRAST TECHNIQUE: Multiplanar, multisequence MR imaging of the lumbar spine was performed. No intravenous contrast was administered. COMPARISON:  Lumbar radiographs March 07, 2021. 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 Alignment: Mild (grade 1) anterolisthesis of L5 on S1. L2 bony retropulsion is detailed below. Vertebrae: Acute/recent L2 superior endplate fracture with approximately 50% height loss and 5-6 mm of bony retropulsion. Associated marrow edema with linear T2 hyperintense fracture cleft through the superior endplate. Bilateral pedicles are involved with marrow edema. Conus medullaris and cauda equina: Conus extends to the L1-L2 level. Conus appears normal. Paraspinal and other soft tissues: Right larger than left renal cysts, suboptimally evaluated due to motion. Disc levels: T12-L1: Only imaged sagittally without evidence of significant canal or foraminal stenosis. L1-L2: Approximately 5-6 mm of bony retropulsion along the superior endplate due to fracture described above. Resulting mild canal stenosis along the superior L2 vertebral body. No significant foraminal stenosis. L2-L3: Slight disc bulging and mild facet arthropathy without significant canal or foraminal stenosis. L3-L4: Mild posterior disc height loss. Mild broad disc bulge with superimposed bilateral foraminal disc protrusions, larger on the left. Mild bilateral facet arthropathy. Resulting moderate left foraminal stenosis without significant canal or right foraminal stenosis. Mild left subarticular recess narrowing. L4-L5: Mild posterior disc height loss. Mild disc bulging and mild bilateral facet arthropathy without significant canal or foraminal stenosis L5-S1: Grade 1 anterolisthesis. Uncovering the disc with right eccentric disc bulge. Right greater than left  facet arthropathy. No significant canal or foraminal stenosis. S1-S2: Transitional anatomy. No significant canal or foraminal stenosis. IMPRESSION: 1. 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 2. Acute/recent L2 superior endplate fracture with 86% height loss, 5-6 mm of bony retropulsion, and involvement of bilateral pedicles. Resulting mild canal stenosis. 3. At L3-L4, moderate left foraminal stenosis. Electronically Signed   By: Margaretha Sheffield M.D.   On: 03/17/2021 12:27        Scheduled Meds:  acetaminophen  650 mg Oral Q6H   amiodarone  200 mg Oral Daily   atorvastatin  40 mg Oral Daily   calcitonin (salmon)  1 spray Alternating Nares Daily   diclofenac Sodium  2 g Topical QID   docusate sodium  100 mg Oral BID   pantoprazole  40 mg Oral Daily   Continuous Infusions:  heparin 650 Units/hr (03/18/21 0450)     LOS: 1 day    Time spent: 34 minutes    Barb Merino, MD [facility] Pager [phone]

## 2021-03-18 NOTE — Progress Notes (Signed)
ANTICOAGULATION CONSULT NOTE - Follow Up Consult  Pharmacy Consult for heparin Indication: atrial fibrillation  Labs: Recent Labs    03/16/21 1219 03/16/21 1219 03/17/21 0543 03/17/21 0943 03/17/21 1756 03/18/21 0333  HGB 12.9  --  13.5  --   --  12.7  HCT 40.5  --  40.9  --   --  39.2  PLT 242  --  261  --   --  255  APTT  --    < > 69* 101* 79* 110*  LABPROT  --   --  14.0  --   --   --   INR  --   --  1.1  --   --   --   HEPARINUNFRC  --    < > 0.49 0.75* 0.58 0.84*  CREATININE 1.13*  --  1.20*  --   --   --    < > = values in this interval not displayed.    Assessment: 87yo female supratherapeutic on heparin after one set of labs at goal; no infusion issues or signs of bleeding per RN.  Goal of Therapy:  Heparin level 0.3-0.7 units/ml aPTT 66-102 seconds   Plan:  Will decrease heparin infusion by 1-2 units/kg/hr to 650 units/hr and check PTT in 8 hours.    Wynona Neat, PharmD, BCPS  03/18/2021,4:49 AM

## 2021-03-18 NOTE — Evaluation (Signed)
Occupational Therapy Evaluation Patient Details Name: [name] Small MRN: [id number] DOB: 16-Jan-1929 Today's Date: 03/18/2021   History of Present Illness 87 yo female admitted with back pain. MRI: acute/recent L2 superior endplate fracture with 73% height loss,  5-6 mm of bony retropulsion, and involvement of bilateral pedicles.  Resulting mild canal stenosis.  3. At L3-L4, moderate left foraminal stenosis. PHMx: CA (melanoma of leg), HTN, L femur IM nail 2016,atrial fibrillation, sick sinus syndrome status post pacemaker placement,fall on March 07, 2021 with x-rays showing a L2 compression fracture, CKD III   Clinical Impression   This 87 yo female admitted with above presents to acute OT with PLOF of being able to live by herself and manage her own basic ADLs and some IADLs until recently when she started falling--since that time she has had to have A for basic ADLs. Currently she is setup/S-min A for basic ADLs and associated mobility. She will continue to benefit from acute OT with follow up at SNF for continued therapies to work on balance, mobility, and ADLs so she can return home.      Recommendations for follow up therapy are one component of a multi-disciplinary discharge planning process, led by the attending physician.  Recommendations may be updated based on patient status, additional functional criteria and insurance authorization.   Follow Up Recommendations  Skilled nursing-short term rehab (<3 hours/day)    Assistance Recommended at Discharge Frequent or constant Supervision/Assistance  Functional Status Assessment  Patient has had a recent decline in their functional status and demonstrates the ability to make significant improvements in function in a reasonable and predictable amount of time.  Equipment Recommendations  Other (comment) (TBD next venue)    Recommendations for Other Services       Precautions / Restrictions Precautions Precautions: Fall;Back Precaution  Booklet Issued: No Precaution Comments: followed back precautions for comfort Restrictions Weight Bearing Restrictions: No      Mobility Bed Mobility Overal bed mobility: Needs Assistance Bed Mobility: Rolling;Sidelying to Sit Rolling: Min guard Sidelying to sit: Min assist       General bed mobility comments: Instructed patient on log roll technique. MinA for trunk elevation    Transfers Overall transfer level: Needs assistance Equipment used: Rolling walker (2 wheels) Transfers: Sit to/from Stand Sit to Stand: Min assist           General transfer comment: minA to rise and steady. Cues for hand placement and scooting forward towards EOB      Balance Overall balance assessment: Needs assistance;History of Falls Sitting-balance support: No upper extremity supported;Feet supported Sitting balance-Leahy Scale: Fair     Standing balance support: Bilateral upper extremity supported;During functional activity;Reliant on assistive device for balance Standing balance-Leahy Scale: Poor Standing balance comment: reliant on UE support and minA for balance                           ADL either performed or assessed with clinical judgement   ADL Overall ADL's : Needs assistance/impaired Eating/Feeding: Independent;Sitting Eating/Feeding Details (indicate cue type and reason): currently NPO, but based off how she is moving should not have any issues with self feeding Grooming: Set up;Sitting;Supervision/safety   Upper Body Bathing: Set up;Sitting;Supervision/ safety   Lower Body Bathing: Minimal assistance;Sit to/from stand   Upper Body Dressing : Supervision/safety;Set up;Sitting   Lower Body Dressing: Minimal assistance;Sit to/from stand   Toilet Transfer: Minimal assistance;Ambulation;Rolling walker (2 wheels) Toilet Transfer Details (indicate cue type  and reason): Bed>door of room>recliner other side of bed Toileting- Clothing Manipulation and Hygiene:  Minimal assistance;Sit to/from stand               Vision Baseline Vision/History: 1 Wears glasses Ability to See in Adequate Light: 0 Adequate              Pertinent Vitals/Pain Pain Assessment: 0-10 Pain Score: 7  Pain Location: L hip (in for back pain, but reported multiple times her left hip was where pain was)--no outward signs/symptoms of pain seen for back or hip Pain Descriptors / Indicators: Discomfort;Grimacing Pain Intervention(s): Monitored during session;Repositioned     Hand Dominance Right   Extremity/Trunk Assessment Upper Extremity Assessment Upper Extremity Assessment: Overall WFL for tasks assessed   Lower Extremity Assessment Lower Extremity Assessment: Generalized weakness   Cervical / Trunk Assessment Cervical / Trunk Assessment: Kyphotic   Communication Communication Communication: No difficulties   Cognition Arousal/Alertness: Awake/alert Behavior During Therapy: WFL for tasks assessed/performed Overall Cognitive Status: Impaired/Different from baseline Area of Impairment: Orientation;Memory;Awareness;Safety/judgement                 Orientation Level: Disoriented to;Time;Situation   Memory: Decreased short-term memory   Safety/Judgement: Decreased awareness of safety;Decreased awareness of deficits Awareness: Intellectual   General Comments: States she fell and broke her hip. Attempted to reorient with no success. STM deficits. Unable to recall activities during session                [place] expects to be discharged to:: Skilled nursing facility Living Arrangements: Alone Available Help at Discharge: Family;Available PRN/intermittently Type of Home: House Home Access: Stairs to enter [employer] of Steps: 6 Entrance Stairs-Rails: Left Home Layout: One level     Bathroom Shower/Tub: Teacher, early years/pre: Standard     Home Equipment: Chartered certified accountant;Shower seat           Prior Functioning/Environment Prior Level of Function : History of Falls (last six months);Needs assist       Physical Assist : Mobility (physical);ADLs (physical) Mobility (physical): Gait ADLs (physical): Bathing;Dressing;IADLs Mobility Comments: uses standard walker. Son reports 3-4 falls in past months. ADLs Comments: Patient reports requiring assist for bathing and dressing from daughter in law        OT Problem List: Decreased strength;Decreased range of motion;Impaired balance (sitting and/or standing);Decreased knowledge of precautions;Decreased cognition;Pain      OT Treatment/Interventions: Self-care/ADL training;Patient/family education;DME and/or AE instruction;Balance training    OT Goals(Current goals can be found in the care plan section) Acute Rehab OT Goals Patient Stated Goal: to have surgery for her back OT Goal Formulation: With patient Time For Goal Achievement: 04/01/21 Potential to Achieve Goals: Good  OT Frequency: Min 2X/week   Barriers to D/C: Decreased caregiver support          Co-evaluation PT/OT/SLP Co-Evaluation/Treatment: Yes Reason for Co-Treatment: For patient/therapist safety;To address functional/ADL transfers PT goals addressed during session: Mobility/safety with mobility;Balance;Strengthening/ROM OT goals addressed during session: Strengthening/ROM;ADL's and self-care      AM-PAC OT "6 Clicks" Daily Activity     Outcome Measure Help from another person eating meals?: None Help from another person taking care of personal grooming?: A Little Help from another person toileting, which includes using toliet, bedpan, or urinal?: A Little Help from another person bathing (including washing, rinsing, drying)?: A Little Help from another person to put on and taking off regular upper body clothing?: A Little Help from another person to put  on and taking off regular lower body clothing?: A Little 6 Click Score: 19   End of Session  Equipment Utilized During Treatment: Rolling walker (2 wheels) Nurse Communication: Mobility status  Activity Tolerance: Patient tolerated treatment well Patient left: in chair;with call bell/phone within reach;with chair alarm set;with family/visitor present (Advised son to let RN know when he was leaving so they could help pt back to bed)  OT Visit Diagnosis: Unsteadiness on feet (R26.81);Other abnormalities of gait and mobility (R26.89);Muscle weakness (generalized) (M62.81);Pain Pain - Right/Left: Left Pain - part of body: Hip (came in for back pain)                Time: 3888-2800 OT Time Calculation (min): 35 min Charges:  OT Evaluation $OT Eval Moderate Complexity: 1 Mod  [place] [address], OTR/L Acute [employer] Pager [phone] Office [phone]    Almon Register 03/18/2021, 1:11 PM

## 2021-03-18 NOTE — Evaluation (Signed)
Physical Therapy Evaluation Patient Details Name: [name] Small MRN: [id number] DOB: 04/28/1929 Today's Date: 03/18/2021  History of Present Illness  87 yo female admitted with back pain. MRI: acute/recent L2 superior endplate fracture with 39% height loss,  5-6 mm of bony retropulsion, and involvement of bilateral pedicles.  Resulting mild canal stenosis.  3. At L3-L4, moderate left foraminal stenosis. PHMx: CA (melanoma of leg), HTN, L femur IM nail 2016,atrial fibrillation, sick sinus syndrome status post pacemaker placement,fall on March 07, 2021 with x-rays showing a L2 compression fracture, CKD III  Clinical Impression  PTA, patient lives alone and ambulating with RW. Son reports 3-4 falls in past month. Patient disoriented to time and situation during session which son reports being new since admission. Patient with STM deficits with inability to recall activities during session. Patient presents with generalized weakness, impaired balance, decreased activity tolerance, and impaired cognition. Patient functioning at [facility] level for mobility with use of RW. Patient will benefit from skilled PT services during acute stay to address listed deficits. Recommend SNF at discharge to maximize functional independence and reduce fall risk prior to returning home.        Recommendations for follow up therapy are one component of a multi-disciplinary discharge planning process, led by the attending physician.  Recommendations may be updated based on patient status, additional functional criteria and insurance authorization.  Follow Up Recommendations Skilled nursing-short term rehab (<3 hours/day)    Assistance Recommended at Discharge Frequent or constant Supervision/Assistance  Functional Status Assessment Patient has had a recent decline in their functional status and/or demonstrates limited ability to make significant improvements in function in a reasonable and predictable amount of time  Equipment  Recommendations  Rolling Atwood Adcock (2 wheels)    Recommendations for Other Services       Precautions / Restrictions Precautions Precautions: Fall;Back Precaution Booklet Issued: No Restrictions Weight Bearing Restrictions: No      Mobility  Bed Mobility Overal bed mobility: Needs Assistance Bed Mobility: Rolling;Sidelying to Sit Rolling: Min guard Sidelying to sit: Min assist       General bed mobility comments: Instructed patient on log roll technique. MinA for trunk elevation    Transfers Overall transfer level: Needs assistance Equipment used: Rolling Ashby Leflore (2 wheels) Transfers: Sit to/from Stand Sit to Stand: Min assist           General transfer comment: minA to rise and steady. Cues for hand placement and scooting forward towards EOB    Ambulation/Gait Ambulation/Gait assistance: Min assist Gait Distance (Feet): 50 Feet Assistive device: Rolling Janara Klett (2 wheels) Gait Pattern/deviations: Step-through pattern;Decreased stride length;Narrow base of support;Trunk flexed Gait velocity: decreased   General Gait Details: minA for balance with x1 LOB. Slow small shuffle steps. Difficulty turning requiring cues for sequencing  Stairs            Wheelchair Mobility    Modified Rankin (Stroke Patients Only)       Balance Overall balance assessment: Needs assistance;History of Falls Sitting-balance support: No upper extremity supported;Feet supported Sitting balance-Leahy Scale: Fair     Standing balance support: Bilateral upper extremity supported;During functional activity;Reliant on assistive device for balance Standing balance-Leahy Scale: Poor Standing balance comment: reliant on UE support and minA for balance                             Pertinent Vitals/Pain Pain Assessment: 0-10 Pain Score: 7  Pain Location: L hip Pain Descriptors /  Indicators: Discomfort;Grimacing Pain Intervention(s): Monitored during session;Repositioned     Home Living Family/patient expects to be discharged to:: Private residence Living Arrangements: Alone Available Help at Discharge: Family;Available PRN/intermittently Type of Home: House Home Access: Stairs to enter Entrance Stairs-Rails: Left Entrance Stairs-Number of Steps: 6   Home Layout: One level Home Equipment: Standard Ronniesha Seibold;Shower seat      Prior Function Prior Level of Function : History of Falls (last six months);Needs assist       Physical Assist : Mobility (physical);ADLs (physical) Mobility (physical): Gait ADLs (physical): Bathing;Dressing;IADLs Mobility Comments: uses standard Jonas Goh. Son reports 3-4 falls in past months. ADLs Comments: Patient reports requiring assist for bathing and dressing from daughter in law     Hand Dominance        Extremity/Trunk Assessment   Upper Extremity Assessment Upper Extremity Assessment: Defer to OT evaluation    Lower Extremity Assessment Lower Extremity Assessment: Generalized weakness    Cervical / Trunk Assessment Cervical / Trunk Assessment: Kyphotic  Communication   Communication: No difficulties  Cognition Arousal/Alertness: Awake/alert Behavior During Therapy: WFL for tasks assessed/performed Overall Cognitive Status: Impaired/Different from baseline Area of Impairment: Orientation;Memory;Awareness;Safety/judgement                 Orientation Level: Disoriented to;Time;Situation   Memory: Decreased short-term memory   Safety/Judgement: Decreased awareness of safety;Decreased awareness of deficits Awareness: Intellectual   General Comments: States she fell and broke her hip. Attempted to reorient with no success. STM deficits. Unable to recall activities during session        General Comments      Exercises     Assessment/Plan    PT Assessment Patient needs continued PT services  PT Problem List Decreased strength;Decreased activity tolerance;Decreased balance;Decreased  mobility;Decreased cognition;Decreased knowledge of use of DME;Decreased safety awareness;Decreased knowledge of precautions       PT Treatment Interventions DME instruction;Gait training;Stair training;Functional mobility training;Therapeutic activities;Balance training;Therapeutic exercise;Patient/family education    PT Goals (Current goals can be found in the Care Plan section)  Acute Rehab PT Goals Patient Stated Goal: to get better PT Goal Formulation: With patient/family Time For Goal Achievement: 04/01/21 Potential to Achieve Goals: Fair    Frequency Min 5X/week   Barriers to discharge        Co-evaluation               AM-PAC PT "6 Clicks" Mobility  Outcome Measure Help needed turning from your back to your side while in a flat bed without using bedrails?: A Little Help needed moving from lying on your back to sitting on the side of a flat bed without using bedrails?: A Little Help needed moving to and from a bed to a chair (including a wheelchair)?: A Little Help needed standing up from a chair using your arms (e.g., wheelchair or bedside chair)?: A Little Help needed to walk in hospital room?: A Little Help needed climbing 3-5 steps with a railing? : A Little 6 Click Score: 18    End of Session   Activity Tolerance: Patient tolerated treatment well Patient left: in chair;with call bell/phone within reach;with chair alarm set;with family/visitor present Nurse Communication: Mobility status PT Visit Diagnosis: Unsteadiness on feet (R26.81);Muscle weakness (generalized) (M62.81);History of falling (Z91.81)    Time: 8413-2440 PT Time Calculation (min) (ACUTE ONLY): 35 min   Charges:   PT Evaluation $PT Eval Moderate Complexity: 1 Mod PT Treatments $Therapeutic Activity: 8-22 mins        Tyleah Loh A. Gilford Rile, PT,  DPT Acute Rehabilitation Services Pager [phone] Office [phone]   Linna Hoff 03/18/2021, 11:30 AM

## 2021-03-19 ENCOUNTER — Encounter (HOSPITAL_COMMUNITY): Payer: Self-pay | Admitting: Internal Medicine

## 2021-03-19 DIAGNOSIS — S32020A Wedge compression fracture of second lumbar vertebra, initial encounter for closed fracture: Secondary | ICD-10-CM | POA: Diagnosis not present

## 2021-03-19 LAB — APTT: aPTT: 60 seconds — ABNORMAL HIGH (ref 24–36)

## 2021-03-19 LAB — HEPARIN LEVEL (UNFRACTIONATED): Heparin Unfractionated: 0.35 IU/mL (ref 0.30–0.70)

## 2021-03-19 NOTE — TOC Initial Note (Signed)
Transition of Care (TOC) - Initial/Assessment Note    Patient Details  Name: [name] Small MRN: [id number] Date of Birth: 01/28/1929  Transition of Care (TOC) CM/SW Contact:    Clover McKinney, LCSW Phone Number: 03/19/2021, 10:33 AM  Clinical Narrative:                 CSW met with patient to discuss SNF referral process. CSW introduced herself and role to patient. CSW discussed recommendation for SNF and what to expect. Patient disclosed she has been talking with her son about [facility]. CSW informed patient she can send a referral to them but cannot guarantee a specific bed acceptance. Patient voiced understanding and voiced consent for CSW to send referrals and talk with her son if needed.   Expected Discharge Plan: Skilled Nursing Facility Barriers to Discharge: SNF Pending bed offer   Patient Goals and CMS Choice Patient states their goals for this hospitalization and ongoing recovery are:: "I want to do what I need to do." CMS Medicare.gov Compare Post Acute Care list provided to:: Patient Choice offered to / list presented to : Patient  Expected Discharge Plan and Services Expected Discharge Plan: Skilled Nursing Facility     Post Acute Care Choice: Skilled Nursing Facility                                        Prior Living Arrangements/Services   Lives with:: Self Patient language and need for interpreter reviewed:: Yes Do you feel safe going back to the place where you live?: Yes      Need for Family Participation in Patient Care: No (Comment) Care giver support system in place?: No (comment)   Criminal Activity/Legal Involvement Pertinent to Current Situation/Hospitalization: No - Comment as needed  Activities of Daily Living      Permission Sought/Granted Permission sought to share information with : Facility Contact Representative, Family Supports Permission granted to share information with : Yes, Verbal Permission Granted  Share Information  with NAME: Patient son and facilities for referrals           Emotional Assessment Appearance:: Appears stated age     Orientation: : Oriented to Self, Oriented to Place, Oriented to  Time, Oriented to Situation Alcohol / Substance Use: Not Applicable Psych Involvement: No (comment)  Admission diagnosis:  Low back pain [M54.50] Acute midline low back pain without sciatica [M54.50] Hypertension, unspecified type [I10] Compression fracture of L2 vertebra with routine healing, subsequent encounter [S32.020D] Lumbar compression fracture (HCC) [S32.000A] Patient Active Problem List   Diagnosis Date Noted   Compression fracture of L2 (HCC) 03/17/2021   Lumbar compression fracture (HCC) 03/17/2021   Hypertension    Low back pain 03/16/2021   Hypertensive urgency 03/16/2021   PAF (paroxysmal atrial fibrillation) (HCC) 03/16/2021   Compression fracture of L2 lumbar vertebra (HCC)    Porokeratosis 05/02/2019   Hav (hallux abducto valgus), unspecified laterality 05/02/2019   Hyperglycemia 12/07/2014   Atrial fibrillation with RVR-CHADs VASc=4 12/07/2014   Intertrochanteric fracture of left hip (HCC) 07/08/2014   Overactive bladder 04/02/2012   Essential hypertension 04/02/2012   Chest pain, atypical 09/17/2011   Dyslipidemia 11/22/2006   DIVERTICULOSIS, COLON 11/22/2006   Osteoporosis 11/22/2006   PCP:  Banks, Shannon R, MD Pharmacy:   [id number] - [place], [place] - [address]   [place] [address] Phone: [phone] Fax: [phone]     Social Determinants of Health (SDOH) Interventions    Readmission Risk Interventions No flowsheet data found.

## 2021-03-19 NOTE — Progress Notes (Signed)
PROGRESS NOTE    [name] Small  HQP:[id number] DOB: 13-Oct-1928 DOA: 03/16/2021 PCP: Billie Ruddy, MD    Brief Narrative:  87 year old with history of chronic A. fib, sick sinus syndrome status post permanent pacemaker, hypertension, CKD stage IIIa and dyslipidemia brought to the hospital with unrelenting pain after a fall at home 10 days ago.  Using opiates without pain relief.  In the emergency room hypertensive otherwise hemodynamically stable.  MRI consistent withL2 compression fracture with more than 50% height loss.  Admitted for symptom management.   Assessment & Plan:   Principal Problem:   Compression fracture of L2 (HCC) Active Problems:   Dyslipidemia   Osteoporosis   Low back pain   Hypertensive urgency   PAF (paroxysmal atrial fibrillation) (HCC)   Lumbar compression fracture (HCC)   Hypertension  Closed traumatic vertebral fracture with unrelenting pain, compression fracture: MRI consistent with superior endplate fracture with more than 50% height loss. Admitted with ongoing pain.  Some clinical improvement today. Continue Tylenol, oxycodone along with bowel regimen as well IV pain medications to control her pain. Start mobilizing.  Work with PT OT.  Stable spinal fracture, no precautions needed. IR was consulted for kyphoplasty, case under review.  If she does not have much pain on mobility today tomorrow, she may will not need kyphoplasty.  Hypertensive urgency Dyslipidemia CKD stage IIIa Is stable.  At about her baseline.  Paroxysmal A. fib: Permanent pacemaker present.  Currently in sinus rhythm.  On amiodarone and metoprolol.  Was on Xarelto at home.  Discontinued since admission for anticipated procedure. Currently remains on IV heparin infusion.  She is having difficulties with levels, blood draws and mobility.  We will discontinue heparin.  No indication to bridge currently.  If IR decides no procedure, will put her back on Xarelto.    DVT prophylaxis:    Heparin infusion   Code Status: Full code Family Communication: Son on the phone 10/28. Disposition Plan: Status is: Inpatient  Remains inpatient appropriate because: Persistent pain.  In need for inpatient procedure.  Patient is medically stabilizing.  Discontinue telemetry.  Discontinue heparin.  If pain is well controlled by next 24 hours, will transfer to skilled nursing facility.  Patient seen and examined.  No overnight events.  Denies any pain.  Not mobilized today.     Consultants:  Interventional radiology  Procedures:  None  Antimicrobials:  None   Subjective: She is irritated with restricted IV line on one hand and continuous pulse ox on other hand making her difficult to eat.   Objective: Vitals:   03/18/21 2226 03/18/21 2356 03/19/21 0355 03/19/21 0758  BP: (!) 107/44 (!) 139/50 (!) 129/55 (!) 124/54  Pulse: 60 61 70 71  Resp: 15 14 15 14   Temp:  (!) 97.5 F (36.4 C) 98.1 F (36.7 C) 98 F (36.7 C)  TempSrc:  Oral Oral Oral  SpO2: 94% 95% 96% 95%  Weight:      Height:        Intake/Output Summary (Last 24 hours) at 03/19/2021 1110 Last data filed at 03/19/2021 0300 Gross per 24 hour  Intake 169.09 ml  Output --  Net 169.09 ml   Filed Weights   03/16/21 2300  Weight: 54.4 kg    Examination:  General: Looks fairly comfortable.  She is alert and oriented x2-3.  Frail debilitated and deconditioned. Cardiovascular: S1-S2 normal.  Regular rate rhythm.  Patient has pacemaker in place. Respiratory: Bilateral clear.  No added sounds. Gastrointestinal:  S1-S2 normal.  Soft nontender. Ext: No swelling or edema. Neuro: No neurological deficits.     Data Reviewed: I have personally reviewed following labs and imaging studies  CBC: Recent Labs  Lab 03/16/21 1219 03/17/21 0543 03/18/21 0333  WBC 8.7 9.2 10.0  HGB 12.9 13.5 12.7  HCT 40.5 40.9 39.2  MCV 94.4 93.8 92.7  PLT 242 261 974   Basic Metabolic Panel: Recent Labs  Lab  03/16/21 1219 03/17/21 0543 03/18/21 0333  NA 138 136 136  K 3.6 3.6 3.7  CL 105 104 103  CO2 23 22 26   GLUCOSE 101* 103* 120*  BUN 14 15 21   CREATININE 1.13* 1.20* 1.22*  CALCIUM 9.1 9.4 9.3   GFR: Estimated Creatinine Clearance: 26.3 mL/min (A) (by C-G formula based on SCr of 1.22 mg/dL (H)). Liver Function Tests: No results for input(s): AST, ALT, ALKPHOS, BILITOT, PROT, ALBUMIN in the last 168 hours. No results for input(s): LIPASE, AMYLASE in the last 168 hours. No results for input(s): AMMONIA in the last 168 hours. Coagulation Profile: Recent Labs  Lab 03/17/21 0543  INR 1.1   Cardiac Enzymes: No results for input(s): CKTOTAL, CKMB, CKMBINDEX, TROPONINI in the last 168 hours. BNP (last 3 results) No results for input(s): PROBNP in the last 8760 hours. HbA1C: No results for input(s): HGBA1C in the last 72 hours. CBG: No results for input(s): GLUCAP in the last 168 hours. Lipid Profile: No results for input(s): CHOL, HDL, LDLCALC, TRIG, CHOLHDL, LDLDIRECT in the last 72 hours. Thyroid Function Tests: No results for input(s): TSH, T4TOTAL, FREET4, T3FREE, THYROIDAB in the last 72 hours. Anemia Panel: No results for input(s): VITAMINB12, FOLATE, FERRITIN, TIBC, IRON, RETICCTPCT in the last 72 hours. Sepsis Labs: No results for input(s): PROCALCITON, LATICACIDVEN in the last 168 hours.  Recent Results (from the past 240 hour(s))  Resp Panel by RT-PCR (Flu A&B, Covid) Nasopharyngeal Swab     Status: None   Collection Time: 03/17/21 12:38 AM   Specimen: Nasopharyngeal Swab; Nasopharyngeal(NP) swabs in vial transport medium  Result Value Ref Range Status   SARS Coronavirus 2 by RT PCR NEGATIVE NEGATIVE Final    Comment: (NOTE) SARS-CoV-2 target nucleic acids are NOT DETECTED.  The SARS-CoV-2 RNA is generally detectable in upper respiratory specimens during the acute phase of infection. The lowest concentration of SARS-CoV-2 viral copies this assay can detect  is 138 copies/mL. A negative result does not preclude SARS-Cov-2 infection and should not be used as the sole basis for treatment or other patient management decisions. A negative result may occur with  improper specimen collection/handling, submission of specimen other than nasopharyngeal swab, presence of viral mutation(s) within the areas targeted by this assay, and inadequate number of viral copies(<138 copies/mL). A negative result must be combined with clinical observations, patient history, and epidemiological information. The expected result is Negative.  Fact Sheet for Patients:  EntrepreneurPulse.com.au  Fact Sheet for Healthcare Providers:  IncredibleEmployment.be  This test is no t yet approved or cleared by the [place] FDA and  has been authorized for detection and/or diagnosis of SARS-CoV-2 by FDA under an Emergency Use Authorization (EUA). This EUA will remain  in effect (meaning this test can be used) for the duration of the COVID-19 declaration under Section 564(b)(1) of the Act, 21 U.S.C.section 360bbb-3(b)(1), unless the authorization is terminated  or revoked sooner.       Influenza A by PCR NEGATIVE NEGATIVE Final   Influenza B by PCR NEGATIVE NEGATIVE Final  Comment: (NOTE) The Xpert Xpress SARS-CoV-2/FLU/RSV plus assay is intended as an aid in the diagnosis of influenza from Nasopharyngeal swab specimens and should not be used as a sole basis for treatment. Nasal washings and aspirates are unacceptable for Xpert Xpress SARS-CoV-2/FLU/RSV testing.  Fact Sheet for Patients: EntrepreneurPulse.com.au  Fact Sheet for Healthcare Providers: IncredibleEmployment.be  This test is not yet approved or cleared by the [place] FDA and has been authorized for detection and/or diagnosis of SARS-CoV-2 by FDA under an Emergency Use Authorization (EUA). This EUA will remain in effect  (meaning this test can be used) for the duration of the COVID-19 declaration under Section 564(b)(1) of the Act, 21 U.S.C. section 360bbb-3(b)(1), unless the authorization is terminated or revoked.  Performed at [facility], [place] [address], [place], [place] [address]          Radiology Studies: MR LUMBAR SPINE WO CONTRAST  Result Date: 03/17/2021 CLINICAL DATA:  Spine fracture, lumbosacral, traumatic worsening lower lumbar and sacral pain after fall 10 days ago, is anticoagulated EXAM: MRI LUMBAR SPINE WITHOUT CONTRAST TECHNIQUE: Multiplanar, multisequence MR imaging of the lumbar spine was performed. No intravenous contrast was administered. COMPARISON:  Lumbar radiographs March 07, 2021. 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 Alignment: Mild (grade 1) anterolisthesis of L5 on S1. L2 bony retropulsion is detailed below. Vertebrae: Acute/recent L2 superior endplate fracture with approximately 50% height loss and 5-6 mm of bony retropulsion. Associated marrow edema with linear T2 hyperintense fracture cleft through the superior endplate. Bilateral pedicles are involved with marrow edema. Conus medullaris and cauda equina: Conus extends to the L1-L2 level. Conus appears normal. Paraspinal and other soft tissues: Right larger than left renal cysts, suboptimally evaluated due to motion. Disc levels: T12-L1: Only imaged sagittally without evidence of significant canal or foraminal stenosis. L1-L2: Approximately 5-6 mm of bony retropulsion along the superior endplate due to fracture described above. Resulting mild canal stenosis along the superior L2 vertebral body. No significant foraminal stenosis. L2-L3: Slight disc bulging and mild facet arthropathy without significant canal or foraminal stenosis. L3-L4: Mild posterior disc height loss. Mild broad disc bulge with superimposed bilateral foraminal disc protrusions, larger on the left. Mild  bilateral facet arthropathy. Resulting moderate left foraminal stenosis without significant canal or right foraminal stenosis. Mild left subarticular recess narrowing. L4-L5: Mild posterior disc height loss. Mild disc bulging and mild bilateral facet arthropathy without significant canal or foraminal stenosis L5-S1: Grade 1 anterolisthesis. Uncovering the disc with right eccentric disc bulge. Right greater than left facet arthropathy. No significant canal or foraminal stenosis. S1-S2: Transitional anatomy. No significant canal or foraminal stenosis. IMPRESSION: 1. 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 2. Acute/recent L2 superior endplate fracture with 38% height loss, 5-6 mm of bony retropulsion, and involvement of bilateral pedicles. Resulting mild canal stenosis. 3. At L3-L4, moderate left foraminal stenosis. Electronically Signed   By: Margaretha Sheffield M.D.   On: 03/17/2021 12:27        Scheduled Meds:  acetaminophen  650 mg Oral Q6H   amiodarone  200 mg Oral Daily   atorvastatin  40 mg Oral Daily   calcitonin (salmon)  1 spray Alternating Nares Daily   diclofenac Sodium  2 g Topical QID   docusate sodium  100 mg Oral BID   pantoprazole  40 mg Oral Daily   Continuous Infusions:     LOS: 2 days    Time spent: 32 minutes    Barb Merino,  MD [facility] Pager [phone]

## 2021-03-19 NOTE — NC FL2 (Signed)
[place] LEVEL OF CARE SCREENING TOOL     IDENTIFICATION  Patient Name: [name] Small Birthdate: 07/30/1928 Sex: female Admission Date (Current Location): 03/16/2021  [facility] and [place] Number:  Herbalist and Address:  [facility] [place], [place] [address] [place], [place] [address]      Provider Number: [id number]  Attending Physician Name and Address:  Barb Merino, MD  Relative Name and Phone Number:  Lili Harts, [phone]    Current Level of Care: Hospital Recommended Level of Care: [place] Prior Approval Number:    Date Approved/Denied:   PASRR Number: [id number]  Discharge Plan: Home    Current Diagnoses: Patient Active Problem List   Diagnosis Date Noted   Compression fracture of L2 ([place]) 03/17/2021   Lumbar compression fracture ([place]) 03/17/2021   Hypertension    Low back pain 03/16/2021   Hypertensive urgency 03/16/2021   PAF (paroxysmal atrial fibrillation) ([place]) 03/16/2021   Compression fracture of L2 lumbar vertebra (HCC)    Porokeratosis 05/02/2019   Hav (hallux abducto valgus), unspecified laterality 05/02/2019   Hyperglycemia 12/07/2014   Atrial fibrillation with RVR-CHADs VASc=4 12/07/2014   Intertrochanteric fracture of left hip ([place]) 07/08/2014   Overactive bladder 04/02/2012   Essential hypertension 04/02/2012   Chest pain, atypical 09/17/2011   Dyslipidemia 11/22/2006   DIVERTICULOSIS, COLON 11/22/2006   Osteoporosis 11/22/2006    Orientation RESPIRATION BLADDER Height & Weight     Self, Time, Situation, Place  Normal Continent Weight: 119 lb 14.9 oz (54.4 kg) Height:  5\' 5"  (165.1 cm)  BEHAVIORAL SYMPTOMS/MOOD NEUROLOGICAL BOWEL NUTRITION STATUS      Continent    AMBULATORY STATUS COMMUNICATION OF NEEDS Skin   Limited Assist Verbally Normal                       Personal Care Assistance Level of Assistance  Bathing, Feeding, Dressing Bathing Assistance: Limited  assistance Feeding assistance: Independent Dressing Assistance: Limited assistance     Functional Limitations Info             SPECIAL CARE FACTORS FREQUENCY  PT (By licensed PT), OT (By licensed OT)     PT Frequency: 5x weekly OT Frequency: 5x weekly            Contractures Contractures Info: Not present    Additional Factors Info  Allergies, Code Status Code Status Info: Full Allergies Info: septra, lisinopril           Current Medications (03/19/2021):  This is the current hospital active medication list Current Facility-Administered Medications  Medication Dose Route Frequency Provider Last Rate Last Admin   acetaminophen (TYLENOL) tablet 650 mg  650 mg Oral Q6H Arrien, Jimmy Picket, MD   650 mg at 03/19/21 0653   amiodarone (PACERONE) tablet 200 mg  200 mg Oral Daily Rise Patience, MD   200 mg at 03/19/21 0844   atorvastatin (LIPITOR) tablet 40 mg  40 mg Oral Daily Rise Patience, MD   40 mg at 03/19/21 0844   calcitonin (salmon) (MIACALCIN/FORTICAL) nasal spray 1 spray  1 spray Alternating Nares Daily Rise Patience, MD   1 spray at 03/19/21 0300   diclofenac Sodium (VOLTAREN) 1 % topical gel 2 g  2 g Topical QID Tawni Millers, MD   2 g at 03/19/21 9233   docusate sodium (COLACE) capsule 100 mg  100 mg Oral BID Rise Patience, MD   100  mg at 03/18/21 2155   hydrALAZINE (APRESOLINE) injection 10 mg  10 mg Intravenous Q4H PRN Rise Patience, MD   10 mg at 03/18/21 2153   HYDROmorphone (DILAUDID) injection 0.5 mg  0.5 mg Intravenous Q2H PRN Arrien, Jimmy Picket, MD       oxyCODONE (Oxy IR/ROXICODONE) immediate release tablet 5 mg  5 mg Oral Q4H PRN Rise Patience, MD   5 mg at 03/18/21 2155   pantoprazole (PROTONIX) EC tablet 40 mg  40 mg Oral Daily Arrien, Jimmy Picket, MD   40 mg at 03/19/21 0845     Discharge Medications: Please see discharge summary for a list of discharge medications.  Relevant Imaging  Results:  Relevant Lab Results:   Additional Information SSN: [id number], COVID pfizer 07/27/19, 07/04/19  Alfredia Ferguson, LCSW

## 2021-03-19 NOTE — H&P (Addendum)
Chief Complaint: Patient was seen in consultation today for kyphoplasty of L2 vertebra compression fracture at the request of Barb Merino, MD  Referring Physician(s): Barb Merino, MD  Supervising Physician: Dr. Raliegh Ip. Karenann Cai  Patient Status: [facility] - In-pt  History of Present Illness: [name] Small is a 87 y.o. female with PMH of A. fib, bleeding stomach ulcer, HTN, hyperlipidemia, osteoporosis and presence of cardiac pacemaker.  Patient presented to the ED 03/16/2021 complaining of back pain since her fall on 03/07/2021. Patient's x-ray at that time showed L2 compression fracture with 25% height loss. Patient has been taking Tylenol without relief and having difficulty walking with her walker.  MRI was performed at the emergency visit on 03/16/2021 which resulted acute L2 superior endplate fracture with 37% height loss, 5 to 6 mm bony retropulsion and involvement of bilateral pedicles.  Patient was admitted for pain control and referral to neurosurgery. Dr. Sloan Leiter has referred patient to IR  for kyphoplasty of L2 compression fracture.  Past Medical History:  Diagnosis Date   Atrial fibrillation ([place])    Bleeding stomach ulcer 1980s?   Cancer ([place]) Meloanoma left leg   DIVERTICULOSIS, COLON 11/22/2006   Headache(784.0)    "very often; not regular" (11/25/2015)   History of blood transfusion 1980s   "when I had the bleeding ulcer"   HYPERLIPIDEMIA 11/22/2006   Hypertension    Menopausal syndrome (hot flashes)    OSTEOPOROSIS 11/22/2006   Presence of permanent cardiac pacemaker     Past Surgical History:  Procedure Laterality Date   APPENDECTOMY     BUNIONECTOMY Right    CATARACT EXTRACTION W/ INTRAOCULAR LENS  IMPLANT, BILATERAL Bilateral 2000s   CHOLECYSTECTOMY OPEN     EP IMPLANTABLE DEVICE N/A 11/25/2015   Procedure: Pacemaker Implant;  Surgeon: Will Meredith Leeds, MD;  Location: [place] [facility];  Service: Cardiovascular;  Laterality: N/A;   FEMUR IM NAIL Left  07/08/2014   Procedure: INTRAMEDULLARY (IM) NAIL ;  Surgeon: Meredith Pel, MD;  Location: [facility];  Service: Orthopedics;  Laterality: Left;   FRACTURE SURGERY     REVISION TOTAL HIP ARTHROPLASTY Left 06/2014    Allergies: Septra ds [sulfamethoxazole-trimethoprim] and Lisinopril  Medications: Prior to Admission medications   Medication Sig Start Date End Date Taking? Authorizing Provider  acetaminophen (TYLENOL) 500 MG tablet Take 1 tablet (500 mg total) by mouth every 6 (six) hours as needed. 03/07/21  Yes Varney Biles, MD  amiodarone (PACERONE) 200 MG tablet TAKE 1 TABLET(200 MG) BY MOUTH DAILY Patient taking differently: Take 200 mg by mouth daily with lunch. 02/07/21  Yes Josue Hector, MD  atorvastatin (LIPITOR) 40 MG tablet TAKE 1 TABLET(40 MG) BY MOUTH DAILY Patient taking differently: Take 40 mg by mouth daily with lunch. 02/11/21  Yes Billie Ruddy, MD  calcitonin, salmon, (MIACALCIN) 200 UNIT/ACT nasal spray Place 1 spray into alternate nostrils daily. 03/11/21  Yes Billie Ruddy, MD  Polyvinyl Alcohol-Povidone (REFRESH OP) Place 1 drop into both eyes daily.   Yes [provider]  traMADol (ULTRAM) 50 MG tablet Take 1 tablet (50 mg total) by mouth every 12 (twelve) hours as needed. 05/06/20  Yes Magnant, Charles L, PA-C  XARELTO 15 MG TABS tablet TAKE 1 TABLET(15 MG) BY MOUTH DAILY WITH DINNER Patient taking differently: Take 15 mg by mouth daily with lunch. 09/27/20  Yes Josue Hector, MD  acetaminophen (TYLENOL) 500 MG tablet Take 1 tablet (500 mg total) by mouth every 4 (four)  hours as needed for moderate pain. Patient not taking: Reported on 03/17/2021 03/07/21   Varney Biles, MD  Calcium Carbonate-Vitamin D (CALTRATE 600+D PO) Take 1 tablet by mouth daily.   Patient not taking: Reported on 03/17/2021    [provider]  diclofenac sodium (VOLTAREN) 1 % GEL Apply 4 g topically 4 (four) times daily as needed. Patient not taking: Reported on  03/17/2021 01/25/18   Hilts, Legrand Como, MD  losartan (COZAAR) 50 MG tablet Take 1 tablet (50 mg total) by mouth daily. Patient not taking: Reported on 03/17/2021 08/08/16   Josue Hector, MD  metoprolol tartrate (LOPRESSOR) 25 MG tablet Take 1 tablet (25 mg total) by mouth 2 (two) times daily. Patient not taking: Reported on 03/17/2021 01/06/19   Constance Haw, MD     Family History  Problem Relation Age of Onset   Alzheimer's disease Sister    Cardiomyopathy Brother     Social History   Socioeconomic History   Marital status: Widowed    Spouse name: Not on file   Number of children: 2   Years of education: 47   Highest education level: Not on file  Occupational History    Comment: retired  Tobacco Use   Smoking status: Never   Smokeless tobacco: Never  Substance and Sexual Activity   Alcohol use: No    Alcohol/week: 0.0 standard drinks   Drug use: No   Sexual activity: Never  Other Topics Concern   Not on file  Social History Narrative   Lives alone, son lives beside her   Caffeine - none   Social Determinants of Health   Financial Resource Strain: Low Risk    Difficulty of Paying Living Expenses: Not hard at all  Food Insecurity: No Food Insecurity   Worried About Charity fundraiser in the Last Year: Never true   Arboriculturist in the Last Year: Never true  Transportation Needs: No Transportation Needs   Lack of Transportation (Medical): No   Lack of Transportation (Non-Medical): No  Physical Activity: Inactive   Days of Exercise per Week: 0 days   Minutes of Exercise per Session: 0 min  Stress: No Stress Concern Present   Feeling of Stress : Not at all  Social Connections: Socially Isolated   Frequency of Communication with Friends and Family: More than three times a week   Frequency of Social Gatherings with Friends and Family: More than three times a week   Attends Religious Services: Never   Marine scientist or Organizations: No   Attends English as a second language teacher Meetings: Never   Marital Status: Widowed    Review of Systems: A 12 point ROS discussed and pertinent positives are indicated in the HPI above.  All other systems are negative.  Review of Systems  Constitutional:  Negative for chills and fever.  HENT:  Negative for nosebleeds.   Eyes:  Negative for visual disturbance.  Respiratory:  Negative for cough and shortness of breath.   Cardiovascular:  Negative for chest pain and leg swelling.       Pt reports a pacemaker  Gastrointestinal:  Negative for abdominal pain, blood in stool, nausea and vomiting.  Genitourinary:  Negative for hematuria.  Musculoskeletal:  Positive for back pain.  Neurological:  Negative for dizziness, light-headedness and headaches.   Vital Signs: BP (!) 124/54 (BP Location: Left Arm)   Pulse 71   Temp 98 F (36.7 C) (Oral)   Resp 14  Ht 5\' 5"  (1.651 m)   Wt 119 lb 14.9 oz (54.4 kg)   SpO2 95%   BMI 19.96 kg/m   Physical Exam Constitutional:      Appearance: Normal appearance.  HENT:     Head: Normocephalic and atraumatic.     Mouth/Throat:     Mouth: Mucous membranes are moist.     Pharynx: Oropharynx is clear.  Cardiovascular:     Rate and Rhythm: Normal rate and regular rhythm.     Pulses: Normal pulses.     Heart sounds: Normal heart sounds.  Pulmonary:     Effort: Pulmonary effort is normal. No respiratory distress.     Breath sounds: Normal breath sounds. No stridor. No wheezing, rhonchi or rales.  Abdominal:     General: Abdomen is flat. There is no distension.     Palpations: Abdomen is soft.     Tenderness: There is no abdominal tenderness. There is no guarding.  Musculoskeletal:     Right lower leg: No edema.     Left lower leg: No edema.  Skin:    General: Skin is warm and dry.     Findings: Bruising present.     Comments: Distal posterior forearm has area of bruising from previous IV site  Neurological:     Mental Status: She is alert and oriented to person,  place, and time.  Psychiatric:        Mood and Affect: Mood normal.        Behavior: Behavior normal.        Thought Content: Thought content normal.        Judgment: Judgment normal.    Imaging: DG Lumbar Spine Complete  Result Date: 03/07/2021 CLINICAL DATA:  Fall with lower back pain. EXAM: LUMBAR SPINE - COMPLETE 4+ VIEW COMPARISON:  X-ray sacrum and coccyx 10/18/2016. CT KUB 12/13/2019. FINDINGS: The bones are osteopenic. There is new compression deformity of the superior endplate of L2 with 65% loss vertebral body height. Vertebral body heights are otherwise well maintained. There is mild dextroconvex curvature of the lumbar spine which may be positional. Spinal alignment appears within normal limits. There is mild disc space narrowing throughout the lumbar spine which is stable. There are atherosclerotic calcifications of the aorta. Left-sided hip screw is partially visualized. IMPRESSION: 1. 25% compression deformity superior endplate of L2 is new from 12/13/2019. Please correlate clinically for acuity. 2. Diffuse osteopenia. 3. Mild degenerative changes are stable. 4.  Aortic Atherosclerosis (ICD10-I70.0). Electronically Signed   By: Ronney Asters M.D.   On: 03/07/2021 21:44   CT Head Wo Contrast  Result Date: 03/07/2021 CLINICAL DATA:  Fall, on blood thinners EXAM: CT HEAD WITHOUT CONTRAST TECHNIQUE: Contiguous axial images were obtained from the base of the skull through the vertex without intravenous contrast. COMPARISON:  12/27/2015 FINDINGS: Brain: There is atrophy and chronic small vessel disease changes. No acute intracranial abnormality. Specifically, no hemorrhage, hydrocephalus, mass lesion, acute infarction, or significant intracranial injury. Vascular: No hyperdense vessel or unexpected calcification. Skull: No acute calvarial abnormality. Sinuses/Orbits: No acute findings Other: 90 IMPRESSION: Atrophy, chronic microvascular disease. No acute intracranial abnormality.  Electronically Signed   By: Rolm Baptise M.D.   On: 03/07/2021 22:46   MR LUMBAR SPINE WO CONTRAST  Result Date: 03/17/2021 CLINICAL DATA:  Spine fracture, lumbosacral, traumatic worsening lower lumbar and sacral pain after fall 10 days ago, is anticoagulated EXAM: MRI LUMBAR SPINE WITHOUT CONTRAST TECHNIQUE: Multiplanar, multisequence MR imaging of the lumbar spine was performed.  No intravenous contrast was administered. COMPARISON:  Lumbar radiographs March 07, 2021. 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 Alignment: Mild (grade 1) anterolisthesis of L5 on S1. L2 bony retropulsion is detailed below. Vertebrae: Acute/recent L2 superior endplate fracture with approximately 50% height loss and 5-6 mm of bony retropulsion. Associated marrow edema with linear T2 hyperintense fracture cleft through the superior endplate. Bilateral pedicles are involved with marrow edema. Conus medullaris and cauda equina: Conus extends to the L1-L2 level. Conus appears normal. Paraspinal and other soft tissues: Right larger than left renal cysts, suboptimally evaluated due to motion. Disc levels: T12-L1: Only imaged sagittally without evidence of significant canal or foraminal stenosis. L1-L2: Approximately 5-6 mm of bony retropulsion along the superior endplate due to fracture described above. Resulting mild canal stenosis along the superior L2 vertebral body. No significant foraminal stenosis. L2-L3: Slight disc bulging and mild facet arthropathy without significant canal or foraminal stenosis. L3-L4: Mild posterior disc height loss. Mild broad disc bulge with superimposed bilateral foraminal disc protrusions, larger on the left. Mild bilateral facet arthropathy. Resulting moderate left foraminal stenosis without significant canal or right foraminal stenosis. Mild left subarticular recess narrowing. L4-L5: Mild posterior disc height loss. Mild disc bulging and mild bilateral  facet arthropathy without significant canal or foraminal stenosis L5-S1: Grade 1 anterolisthesis. Uncovering the disc with right eccentric disc bulge. Right greater than left facet arthropathy. No significant canal or foraminal stenosis. S1-S2: Transitional anatomy. No significant canal or foraminal stenosis. IMPRESSION: 1. 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 2. Acute/recent L2 superior endplate fracture with 06% height loss, 5-6 mm of bony retropulsion, and involvement of bilateral pedicles. Resulting mild canal stenosis. 3. At L3-L4, moderate left foraminal stenosis. Electronically Signed   By: Margaretha Sheffield M.D.   On: 03/17/2021 12:27    Labs:  CBC: Recent Labs    03/16/21 1219 03/17/21 0543 03/18/21 0333  WBC 8.7 9.2 10.0  HGB 12.9 13.5 12.7  HCT 40.5 40.9 39.2  PLT 242 261 255    COAGS: Recent Labs    03/17/21 0543 03/17/21 0943 03/17/21 1756 03/18/21 0333 03/18/21 1247 03/19/21 0353  INR 1.1  --   --   --   --   --   APTT 69*   < > 79* 110* 112* 60*   < > = values in this interval not displayed.    BMP: Recent Labs    03/16/21 1219 03/17/21 0543 03/18/21 0333  NA 138 136 136  K 3.6 3.6 3.7  CL 105 104 103  CO2 23 22 26   GLUCOSE 101* 103* 120*  BUN 14 15 21   CALCIUM 9.1 9.4 9.3  CREATININE 1.13* 1.20* 1.22*  GFRNONAA 46* 43* 42*    LIVER FUNCTION TESTS: Recent Labs    09/13/20 1556  BILITOT 0.6  AST 21  ALT 13  ALKPHOS 91  PROT 7.4  ALBUMIN 4.7*    TUMOR MARKERS: No results for input(s): AFPTM, CEA, CA199, CHROMGRNA in the last 8760 hours.  Assessment and Plan:  History of A. fib, bleeding stomach ulcer, HTN, hyperlipidemia, osteoporosis, skin cancer and presence of cardiac pacemaker.  Patient presented to the ED 03/16/2021 complaining of back pain since her fall on 03/07/2021. Patient's x-ray at that time showed L2 compression fracture with 25% height loss. Patient has been taking Tylenol without relief and  having difficulty walking with her walker.  MRI was performed at the emergency visit on 03/16/2021 which resulted acute L2 superior  endplate fracture with 91% height loss, 5 to 6 mm bony retropulsion and involvement of bilateral pedicles.  Patient was admitted for pain control and referral to neurosurgery. Dr. Sloan Leiter has referred patient to IR  for kyphoplasty of L2 compression fracture.  MRI 03/17/2021: IMPRESSION: 1. 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 2. Acute/recent L2 superior endplate fracture with 63% height loss, 5-6 mm of bony retropulsion, and involvement of bilateral pedicles. Resulting mild canal stenosis. 3. At L3-L4, moderate left foraminal stenosis.  Pt resting quietly in bed. She is A&O x4. She is calm, pleasant and in NAD. VSS Pt requested to explain to her son Elenore Rota over the phone about procedure as well. Phone call was made from the patients's room. Patient son Elenore Rota agreed that she should proceed with kyphoplasty to see if she can obtain some pain relief.   INR 10/27: 1.1 Creat 10/28: 1.22, BUN 21, GFR 42 Other labs WNL NPO order placed for 10/31 Per admitting MD, heparin stopped 03/19/21 with no future blood thinning needs.   Risks and benefits of kyphoplasty of L2 compression fracture were discussed with the patient and her son including, but not limited to education regarding the natural healing process of compression fractures without intervention, bleeding, infection, cement migration which may cause spinal cord damage, paralysis, pulmonary embolism or even death.  This interventional procedure involves the use of X-rays and because of the nature of the planned procedure, it is possible that we will have prolonged use of X-ray fluoroscopy.  Potential radiation risks to you include (but are not limited to) the following: - A slightly elevated risk for cancer  several years later in life. This risk is typically less than 0.5% percent.  This risk is low in comparison to the normal incidence of human cancer, which is 33% for women and 50% for men according to the [place]. - Radiation induced injury can include skin redness, resembling a rash, tissue breakdown / ulcers and hair loss (which can be temporary or permanent).   The likelihood of either of these occurring depends on the difficulty of the procedure and whether you are sensitive to radiation due to previous procedures, disease, or genetic conditions.   IF your procedure requires a prolonged use of radiation, you will be notified and given written instructions for further action.  It is your responsibility to monitor the irradiated area for the 2 weeks following the procedure and to notify your physician if you are concerned that you have suffered a radiation induced injury.    All of the patient's and her son's questions were answered, patient is agreeable to proceed.  Consent signed and in IR.  Thank you for this interesting consult.  I greatly enjoyed meeting [name] Small and look forward to participating in their care.  A copy of this report was sent to the requesting provider on this date.  Electronically Signed: Tyson Alias, NP 03/19/2021, 10:00 AM   I spent a total of 30 minutes in face to face in clinical consultation, greater than 50% of which was counseling/coordinating care for kyphoplasty of L2 vertebra compression fracture.

## 2021-03-19 NOTE — [place] Notes (Signed)
ANTICOAGULATION CONSULT NOTE - Follow Up Consult  Pharmacy Consult for heparin Indication: atrial fibrillation  Labs: Recent Labs    03/16/21 1219 03/17/21 0543 03/17/21 0943 03/17/21 1756 03/18/21 0333 03/18/21 1247 03/19/21 0353  HGB 12.9 13.5  --   --  12.7  --   --   HCT 40.5 40.9  --   --  39.2  --   --   PLT 242 261  --   --  255  --   --   APTT  --  69*   < > 79* 110* 112* 60*  LABPROT  --  14.0  --   --   --   --   --   INR  --  1.1  --   --   --   --   --   HEPARINUNFRC  --  0.49   < > 0.58 0.84*  --  0.35  CREATININE 1.13* 1.20*  --   --  1.22*  --   --    < > = values in this interval not displayed.    Assessment/Plan:  87yo female therapeutic on heparin after rate changes. Will continue infusion at current rate of 550 units/hr and confirm stable with additional level.   Wynona Neat, PharmD, BCPS  03/19/2021,4:59 AM

## 2021-03-20 DIAGNOSIS — S32020A Wedge compression fracture of second lumbar vertebra, initial encounter for closed fracture: Secondary | ICD-10-CM | POA: Diagnosis not present

## 2021-03-20 NOTE — Plan of Care (Signed)

## 2021-03-20 NOTE — Progress Notes (Signed)
PROGRESS NOTE    DOCIA KLAR  DPO:[id number] DOB: 30-Jun-1928 DOA: 03/16/2021 PCP: Billie Ruddy, MD    Brief Narrative:  87 year old with history of chronic A. fib, sick sinus syndrome status post permanent pacemaker, hypertension, CKD stage IIIa and dyslipidemia brought to the hospital with unrelenting pain after a fall at home 10 days ago.  Using opiates without pain relief.  In the emergency room hypertensive otherwise hemodynamically stable.  MRI consistent withL2 compression fracture with more than 50% height loss.  Admitted for symptom management.   Assessment & Plan:   Principal Problem:   Compression fracture of L2 (HCC) Active Problems:   Dyslipidemia   Osteoporosis   Low back pain   Hypertensive urgency   PAF (paroxysmal atrial fibrillation) (HCC)   Lumbar compression fracture (HCC)   Hypertension  Closed traumatic vertebral fracture with unrelenting pain,L2 compression fracture: MRI consistent with superior endplate fracture with more than 50% height loss. Admitted with ongoing pain.  Some clinical improvement today. Continue Tylenol, oxycodone along with bowel regimen as well IV pain medications to control her pain. Continue mobility.  Local NSAID gel. Seen by interventional radiology, scheduled for kyphoplasty 10/31.  She can go to a skilled nursing facility after kyphoplasty.  Hypertensive urgency Dyslipidemia CKD stage IIIa Is stable.  At about her baseline.  Paroxysmal A. fib: Permanent pacemaker present.  Currently in sinus rhythm.  On amiodarone and metoprolol.  Was on Xarelto at home.  Discontinued since admission for anticipated procedure. Will stay off anticoagulation until procedure.  Will resume Xarelto after kyphoplasty when cleared by IR.    DVT prophylaxis:   SCDs   Code Status: Full code Family Communication: None today. Disposition Plan: Status is: Inpatient  Remains inpatient appropriate because: Persistent pain.  In need for inpatient  procedure.   Consultants:  Interventional radiology  Procedures:  None  Antimicrobials:  None   Subjective: Patient seen and examined.  No overnight events.  Does not want to use much pain medications.  She tells me she has no pain when laying still.  It hurts on her lower back even to move in the bed or to change position.  She was anxious about the procedure and I explained that to her.  She was satisfied about it.  She was worried about going on anesthesia.   Objective: Vitals:   03/19/21 1930 03/19/21 2309 03/20/21 0316 03/20/21 0829  BP: 124/67  140/85 (!) 157/76  Pulse: 68 68 62 62  Resp: 19 (!) 24 18 16   Temp: 97.9 F (36.6 C) 97.8 F (36.6 C) 97.6 F (36.4 C) 98.1 F (36.7 C)  TempSrc: Oral Oral Oral Oral  SpO2: 98% 93% 96% 97%  Weight:      Height:        Intake/Output Summary (Last 24 hours) at 03/20/2021 1107 Last data filed at 03/19/2021 1757 Gross per 24 hour  Intake 200 ml  Output 300 ml  Net -100 ml   Filed Weights   03/16/21 2300  Weight: 54.4 kg    Examination:  General: Looks fairly comfortable.  Frail debilitated and deconditioned. Cardiovascular: S1-S2 normal.  Regular rate rhythm.  Patient has pacemaker in place. Respiratory: Bilateral clear.  No added sounds. Gastrointestinal: S1-S2 normal.  Soft nontender. Ext: No swelling or edema. Neuro: No neurological deficits.     Data Reviewed: I have personally reviewed following labs and imaging studies  CBC: Recent Labs  Lab 03/16/21 1219 03/17/21 0543 03/18/21 0333  WBC 8.7 9.2  10.0  HGB 12.9 13.5 12.7  HCT 40.5 40.9 39.2  MCV 94.4 93.8 92.7  PLT 242 261 025   Basic Metabolic Panel: Recent Labs  Lab 03/16/21 1219 03/17/21 0543 03/18/21 0333  NA 138 136 136  K 3.6 3.6 3.7  CL 105 104 103  CO2 23 22 26   GLUCOSE 101* 103* 120*  BUN 14 15 21   CREATININE 1.13* 1.20* 1.22*  CALCIUM 9.1 9.4 9.3   GFR: Estimated Creatinine Clearance: 26.3 mL/min (A) (by C-G formula based  on SCr of 1.22 mg/dL (H)). Liver Function Tests: No results for input(s): AST, ALT, ALKPHOS, BILITOT, PROT, ALBUMIN in the last 168 hours. No results for input(s): LIPASE, AMYLASE in the last 168 hours. No results for input(s): AMMONIA in the last 168 hours. Coagulation Profile: Recent Labs  Lab 03/17/21 0543  INR 1.1   Cardiac Enzymes: No results for input(s): CKTOTAL, CKMB, CKMBINDEX, TROPONINI in the last 168 hours. BNP (last 3 results) No results for input(s): PROBNP in the last 8760 hours. HbA1C: No results for input(s): HGBA1C in the last 72 hours. CBG: No results for input(s): GLUCAP in the last 168 hours. Lipid Profile: No results for input(s): CHOL, HDL, LDLCALC, TRIG, CHOLHDL, LDLDIRECT in the last 72 hours. Thyroid Function Tests: No results for input(s): TSH, T4TOTAL, FREET4, T3FREE, THYROIDAB in the last 72 hours. Anemia Panel: No results for input(s): VITAMINB12, FOLATE, FERRITIN, TIBC, IRON, RETICCTPCT in the last 72 hours. Sepsis Labs: No results for input(s): PROCALCITON, LATICACIDVEN in the last 168 hours.  Recent Results (from the past 240 hour(s))  Resp Panel by RT-PCR (Flu A&B, Covid) Nasopharyngeal Swab     Status: None   Collection Time: 03/17/21 12:38 AM   Specimen: Nasopharyngeal Swab; Nasopharyngeal(NP) swabs in vial transport medium  Result Value Ref Range Status   SARS Coronavirus 2 by RT PCR NEGATIVE NEGATIVE Final    Comment: (NOTE) SARS-CoV-2 target nucleic acids are NOT DETECTED.  The SARS-CoV-2 RNA is generally detectable in upper respiratory specimens during the acute phase of infection. The lowest concentration of SARS-CoV-2 viral copies this assay can detect is 138 copies/mL. A negative result does not preclude SARS-Cov-2 infection and should not be used as the sole basis for treatment or other patient management decisions. A negative result may occur with  improper specimen collection/handling, submission of specimen other than  nasopharyngeal swab, presence of viral mutation(s) within the areas targeted by this assay, and inadequate number of viral copies(<138 copies/mL). A negative result must be combined with clinical observations, patient history, and epidemiological information. The expected result is Negative.  Fact Sheet for Patients:  EntrepreneurPulse.com.au  Fact Sheet for Healthcare Providers:  IncredibleEmployment.be  This test is no t yet approved or cleared by the [place] FDA and  has been authorized for detection and/or diagnosis of SARS-CoV-2 by FDA under an Emergency Use Authorization (EUA). This EUA will remain  in effect (meaning this test can be used) for the duration of the COVID-19 declaration under Section 564(b)(1) of the Act, 21 U.S.C.section 360bbb-3(b)(1), unless the authorization is terminated  or revoked sooner.       Influenza A by PCR NEGATIVE NEGATIVE Final   Influenza B by PCR NEGATIVE NEGATIVE Final    Comment: (NOTE) The Xpert Xpress SARS-CoV-2/FLU/RSV plus assay is intended as an aid in the diagnosis of influenza from Nasopharyngeal swab specimens and should not be used as a sole basis for treatment. Nasal washings and aspirates are unacceptable for Xpert Xpress SARS-CoV-2/FLU/RSV testing.  Fact Sheet for Patients: EntrepreneurPulse.com.au  Fact Sheet for Healthcare Providers: IncredibleEmployment.be  This test is not yet approved or cleared by the [place] FDA and has been authorized for detection and/or diagnosis of SARS-CoV-2 by FDA under an Emergency Use Authorization (EUA). This EUA will remain in effect (meaning this test can be used) for the duration of the COVID-19 declaration under Section 564(b)(1) of the Act, 21 U.S.C. section 360bbb-3(b)(1), unless the authorization is terminated or revoked.  Performed at [facility], [place] [address] [place], [place] [address]           Radiology Studies: No results found.      Scheduled Meds:  acetaminophen  650 mg Oral Q6H   amiodarone  200 mg Oral Daily   atorvastatin  40 mg Oral Daily   calcitonin (salmon)  1 spray Alternating Nares Daily   diclofenac Sodium  2 g Topical QID   docusate sodium  100 mg Oral BID   pantoprazole  40 mg Oral Daily   Continuous Infusions:     LOS: 3 days    Time spent: 32 minutes    Barb Merino, MD [facility] Pager [phone]

## 2021-03-20 NOTE — Progress Notes (Addendum)
Patient able to ambulate to restroom with standby assist using front wheel walker. Reports pain 8/10 when ambulating and 1/10 when lying down.

## 2021-03-21 ENCOUNTER — Inpatient Hospital Stay (HOSPITAL_COMMUNITY): Payer: Medicare Other

## 2021-03-21 DIAGNOSIS — S32020A Wedge compression fracture of second lumbar vertebra, initial encounter for closed fracture: Secondary | ICD-10-CM | POA: Diagnosis not present

## 2021-03-21 HISTORY — PX: IR KYPHO LUMBAR INC FX REDUCE BONE BX UNI/BIL CANNULATION INC/IMAGING: IMG5519

## 2021-03-21 HISTORY — PX: IR KYPHO EA ADDL LEVEL THORACIC OR LUMBAR: IMG5520

## 2021-03-21 LAB — SURGICAL PCR SCREEN
MRSA, PCR: NEGATIVE
Staphylococcus aureus: NEGATIVE

## 2021-03-21 MED ORDER — HYDRALAZINE HCL 20 MG/ML IJ SOLN
INTRAMUSCULAR | Status: AC
  Administered 2021-03-21: 10 mg via INTRAVENOUS
  Filled 2021-03-21: qty 1

## 2021-03-21 MED ORDER — LIDOCAINE HCL 1 % IJ SOLN
INTRAMUSCULAR | Status: AC
  Filled 2021-03-21: qty 20

## 2021-03-21 MED ORDER — LIDOCAINE HCL 1 % IJ SOLN
INTRAMUSCULAR | Status: AC | PRN
  Administered 2021-03-21: 15 mL

## 2021-03-21 MED ORDER — BUPIVACAINE HCL (PF) 0.5 % IJ SOLN
INTRAMUSCULAR | Status: AC | PRN
  Administered 2021-03-21: 20 mL

## 2021-03-21 MED ORDER — MIDAZOLAM HCL 2 MG/2ML IJ SOLN
INTRAMUSCULAR | Status: AC | PRN
  Administered 2021-03-21 (×3): .5 mg via INTRAVENOUS

## 2021-03-21 MED ORDER — BUPIVACAINE HCL (PF) 0.5 % IJ SOLN
INTRAMUSCULAR | Status: AC
  Filled 2021-03-21: qty 30

## 2021-03-21 MED ORDER — MIDAZOLAM HCL 2 MG/2ML IJ SOLN
INTRAMUSCULAR | Status: AC
  Filled 2021-03-21: qty 2

## 2021-03-21 MED ORDER — CEFAZOLIN SODIUM-DEXTROSE 2-4 GM/100ML-% IV SOLN
INTRAVENOUS | Status: AC | PRN
Start: 2021-03-21 — End: 2021-03-21
  Administered 2021-03-21: 2 g via INTRAVENOUS

## 2021-03-21 MED ORDER — FENTANYL CITRATE (PF) 100 MCG/2ML IJ SOLN
INTRAMUSCULAR | Status: AC | PRN
  Administered 2021-03-21 (×3): 25 ug via INTRAVENOUS

## 2021-03-21 MED ORDER — IOHEXOL 300 MG/ML  SOLN: 50.0000 mL | Freq: Once | INTRAMUSCULAR | Status: DC | PRN

## 2021-03-21 MED ORDER — CEFAZOLIN SODIUM-DEXTROSE 2-4 GM/100ML-% IV SOLN
INTRAVENOUS | Status: AC
  Filled 2021-03-21: qty 100

## 2021-03-21 MED ORDER — MUPIROCIN 2 % EX OINT
1.0000 "application " | TOPICAL_OINTMENT | Freq: Two times a day (BID) | CUTANEOUS | Status: DC
  Administered 2021-03-21: 1 via NASAL
  Filled 2021-03-21 (×2): qty 22

## 2021-03-21 MED ORDER — FENTANYL CITRATE (PF) 100 MCG/2ML IJ SOLN
INTRAMUSCULAR | Status: AC
  Filled 2021-03-21: qty 2

## 2021-03-21 NOTE — Procedures (Signed)
INTERVENTIONAL NEURORADIOLOGY BRIEF POSTPROCEDURE NOTE  FLUOROSCOPY GUIDED L2 VETEBRAL BODY CORE BIOPSY AND KYPHOPLASTY  Attending: Dr. Pedro Earls  Assistant: None.  Diagnosis: L2 vertebral body compression fracture.  Access site: Percutaneous, bilateral transpedicular.  Anesthesia: Moderate sedation.  Medication used: 1.5 mg Versed IV; 75 mcg Fentanyl IV.  Complications: None.  Estimated blood loss: Negligible.  Specimen: One core biopsy sample.  Findings: Confirmed compression fracture of L2. A core biopsy sample was obrained prior to bilateral balloon kyphoplasty was performed.  The patient tolerated the procedure well without incident or complication and is in stable condition.

## 2021-03-21 NOTE — Progress Notes (Signed)
Physical Therapy Treatment Patient Details Name: [name] Small MRN: [id number] DOB: 1929/01/28 Today's Date: 03/21/2021   History of Present Illness 87 yo female admitted with back pain. MRI: acute/recent L2 superior endplate fracture with 58% height loss,  5-6 mm of bony retropulsion, and involvement of bilateral pedicles.  Resulting mild canal stenosis.  3. At L3-L4, moderate left foraminal stenosis. PHMx: CA (melanoma of leg), HTN, L femur IM nail 2016,atrial fibrillation, sick sinus syndrome status post pacemaker placement,fall on March 07, 2021 with x-rays showing a L2 compression fracture, CKD III    PT Comments    Patient progressing towards physical therapy goals. Patient ambulated 125' with RW and min guard, cues for increasing step length due to short shuffle steps. Performed seated therex at EOB for LE strengthening. Patient requires increased time perform transfers due to discomfort. Patient continues to be at high risk for falls. Continue to recommend SNF for ongoing Physical Therapy.       Recommendations for follow up therapy are one component of a multi-disciplinary discharge planning process, led by the attending physician.  Recommendations may be updated based on patient status, additional functional criteria and insurance authorization.  Follow Up Recommendations  Skilled nursing-short term rehab (<3 hours/day)     Assistance Recommended at Discharge Frequent or constant Supervision/Assistance  Equipment Recommendations  Rolling Shaleigh Laubscher (2 wheels)    Recommendations for Other Services       Precautions / Restrictions Precautions Precautions: Fall;Back Precaution Booklet Issued: No Precaution Comments: followed back precautions for comfort Restrictions Weight Bearing Restrictions: No     Mobility  Bed Mobility Overal bed mobility: Needs Assistance Bed Mobility: Rolling;Sidelying to Sit;Sit to Sidelying Rolling: Min guard Sidelying to sit: Min guard     Sit  to sidelying: Min guard General bed mobility comments: min guard for safety, increased time required to complete and use of bed rails. Hand over hand guidance to roll toward L    Transfers Overall transfer level: Needs assistance Equipment used: Rolling Maylyn Narvaiz (2 wheels) Transfers: Sit to/from Stand Sit to Stand: Min assist           General transfer comment: minA to rise and steady. Cues for hand placement with poor follow through.    Ambulation/Gait Ambulation/Gait assistance: Min guard Gait Distance (Feet): 125 Feet Assistive device: Rolling Teleah Villamar (2 wheels) Gait Pattern/deviations: Step-through pattern;Decreased stride length;Narrow base of support;Trunk flexed Gait velocity: decreased   General Gait Details: min guard for safety. Small shuffle steps. Cues for increasing step length   Stairs             Wheelchair Mobility    Modified Rankin (Stroke Patients Only)       Balance Overall balance assessment: Needs assistance;History of Falls Sitting-balance support: No upper extremity supported;Feet supported Sitting balance-Leahy Scale: Fair     Standing balance support: Bilateral upper extremity supported;During functional activity;Reliant on assistive device for balance Standing balance-Leahy Scale: Poor Standing balance comment: reliant on UE support                            Cognition Arousal/Alertness: Awake/alert Behavior During Therapy: WFL for tasks assessed/performed Overall Cognitive Status: Impaired/Different from baseline Area of Impairment: Memory;Awareness;Safety/judgement                     Memory: Decreased short-term memory   Safety/Judgement: Decreased awareness of safety;Decreased awareness of deficits Awareness: Emergent  Exercises General Exercises - Lower Extremity Ankle Circles/Pumps: AROM;Both;10 reps;Seated Long Arc Quad: AROM;Both;10 reps;Seated Hip Flexion/Marching: AROM;Both;10  reps;Seated    General Comments        Pertinent Vitals/Pain Pain Assessment: Faces Faces Pain Scale: Hurts little more Pain Location: back Pain Descriptors / Indicators: Discomfort;Grimacing Pain Intervention(s): Monitored during session;Repositioned    Home Living                          Prior Function            PT Goals (current goals can now be found in the care plan section) Acute Rehab PT Goals Patient Stated Goal: to get better PT Goal Formulation: With patient/family Time For Goal Achievement: 04/01/21 Potential to Achieve Goals: Fair Progress towards PT goals: Progressing toward goals    Frequency    Min 5X/week      PT Plan Current plan remains appropriate    Co-evaluation              AM-PAC PT "6 Clicks" Mobility   Outcome Measure  Help needed turning from your back to your side while in a flat bed without using bedrails?: A Little Help needed moving from lying on your back to sitting on the side of a flat bed without using bedrails?: A Little Help needed moving to and from a bed to a chair (including a wheelchair)?: A Little Help needed standing up from a chair using your arms (e.g., wheelchair or bedside chair)?: A Little Help needed to walk in hospital room?: A Little Help needed climbing 3-5 steps with a railing? : A Lot 6 Click Score: 17    End of Session   Activity Tolerance: Patient tolerated treatment well Patient left: in bed;with call bell/phone within reach;with bed alarm set;with family/visitor present Nurse Communication: Mobility status PT Visit Diagnosis: Unsteadiness on feet (R26.81);Muscle weakness (generalized) (M62.81);History of falling (Z91.81)     Time: 5183-3582 PT Time Calculation (min) (ACUTE ONLY): 17 min  Charges:  $Gait Training: 8-22 mins                     Olando Willems A. Gilford Rile PT, DPT Acute Rehabilitation Services Pager [phone] Office [phone]    Linna Hoff 03/21/2021,  10:59 AM

## 2021-03-21 NOTE — TOC Progression Note (Signed)
Transition of Care [facility]) - Progression Note    Patient Details  Name: [name] Small MRN: [id number] Date of Birth: 12-29-1928  [facility] CM/SW Contact  Joanne Chars, LCSW Phone Number: 03/21/2021, 10:22 AM  Clinical Narrative:  CSW spoke with pt and daughter in law in room, presented bed offers.  Pt confirms she does want to accept offer from [facility].  CSW spoke with Dorian Pod at [facility], who confirmed they do have a bed.  CSW also spoke with PT and pt will be seen today.  New PT note needed for insurance auth.     Expected Discharge Plan: Skilled Nursing Facility Barriers to Discharge: SNF Pending bed offer  Expected Discharge Plan and Services Expected Discharge Plan: [place]: [place]                                         Social Determinants of Health (SDOH) Interventions    Readmission Risk Interventions No flowsheet data found.

## 2021-03-21 NOTE — Progress Notes (Signed)
PROGRESS NOTE    [name] Small  OAC:[id number] DOB: 02/07/1929 DOA: 03/16/2021 PCP: Billie Ruddy, MD    Brief Narrative:  87 year old with history of chronic A. fib, sick sinus syndrome status post permanent pacemaker, hypertension, CKD stage IIIa and dyslipidemia brought to the hospital with unrelenting pain after a fall at home 10 days ago.  Using opiates without pain relief.  In the emergency room hypertensive otherwise hemodynamically stable.  MRI consistent withL2 compression fracture with more than 50% height loss.  Admitted for symptom management.   Assessment & Plan:   Principal Problem:   Compression fracture of L2 (HCC) Active Problems:   Dyslipidemia   Osteoporosis   Low back pain   Hypertensive urgency   PAF (paroxysmal atrial fibrillation) (HCC)   Lumbar compression fracture (HCC)   Hypertension  Closed traumatic vertebral fracture with unrelenting pain,L2 compression fracture: MRI consistent with superior endplate fracture with more than 50% height loss. Seen by IR.  Scheduled for kyphoplasty today.   Continue Tylenol, oxycodone along with bowel regimen as well IV pain medications to control her pain. Continue mobility.  Local NSAID gel.  Hypertensive urgency Dyslipidemia CKD stage IIIa Is stable.  At about her baseline.  Paroxysmal A. fib: Permanent pacemaker present.  Currently in sinus rhythm.  On amiodarone and metoprolol.  Was on Xarelto at home.  Discontinued since admission for anticipated procedure. Will stay off anticoagulation until procedure.  Will resume Xarelto after kyphoplasty when cleared by IR.    DVT prophylaxis:   SCDs   Code Status: Full code Family Communication: Daughter-in-law at the bedside. Disposition Plan: Status is: Inpatient  Remains inpatient appropriate because: Persistent pain.  In need for inpatient procedure.   Consultants:  Interventional radiology  Procedures:  None  Antimicrobials:   None   Subjective:  Patient seen and examined.  Her daughter-in-law was at the bedside.  Patient complains of pain on mobility otherwise no other complaints.  Anxious about the procedure today. She is eager to get to [facility] for rehab.   Objective: Vitals:   03/20/21 2314 03/21/21 0325 03/21/21 0805 03/21/21 1120  BP: (!) 159/64 (!) 156/60 (!) 169/78 (!) 176/73  Pulse: 63 60 60 63  Resp: 20 20    Temp: 98.6 F (37 C) 97.8 F (36.6 C) 98.1 F (36.7 C) 98.1 F (36.7 C)  TempSrc: Oral Oral Oral Oral  SpO2: 96% 97% 99% 98%  Weight:      Height:        Intake/Output Summary (Last 24 hours) at 03/21/2021 1137 Last data filed at 03/21/2021 0325 Gross per 24 hour  Intake --  Output 600 ml  Net -600 ml   Filed Weights   03/16/21 2300  Weight: 54.4 kg    Examination:  General: Comfortable.  Anxious with the circumstances. Cardiovascular: S1-S2 normal.  Regular rate rhythm.  Patient has pacemaker in place. Respiratory: Bilateral clear.  No added sounds. Gastrointestinal: S1-S2 normal.  Soft nontender. Ext: No swelling or edema. Neuro: No neurological deficits.     Data Reviewed: I have personally reviewed following labs and imaging studies  CBC: Recent Labs  Lab 03/16/21 1219 03/17/21 0543 03/18/21 0333  WBC 8.7 9.2 10.0  HGB 12.9 13.5 12.7  HCT 40.5 40.9 39.2  MCV 94.4 93.8 92.7  PLT 242 261 010   Basic Metabolic Panel: Recent Labs  Lab 03/16/21 1219 03/17/21 0543 03/18/21 0333  NA 138 136 136  K 3.6 3.6 3.7  CL 105 104  103  CO2 23 22 26   GLUCOSE 101* 103* 120*  BUN 14 15 21   CREATININE 1.13* 1.20* 1.22*  CALCIUM 9.1 9.4 9.3   GFR: Estimated Creatinine Clearance: 26.3 mL/min (A) (by C-G formula based on SCr of 1.22 mg/dL (H)). Liver Function Tests: No results for input(s): AST, ALT, ALKPHOS, BILITOT, PROT, ALBUMIN in the last 168 hours. No results for input(s): LIPASE, AMYLASE in the last 168 hours. No results for input(s): AMMONIA in  the last 168 hours. Coagulation Profile: Recent Labs  Lab 03/17/21 0543  INR 1.1   Cardiac Enzymes: No results for input(s): CKTOTAL, CKMB, CKMBINDEX, TROPONINI in the last 168 hours. BNP (last 3 results) No results for input(s): PROBNP in the last 8760 hours. HbA1C: No results for input(s): HGBA1C in the last 72 hours. CBG: No results for input(s): GLUCAP in the last 168 hours. Lipid Profile: No results for input(s): CHOL, HDL, LDLCALC, TRIG, CHOLHDL, LDLDIRECT in the last 72 hours. Thyroid Function Tests: No results for input(s): TSH, T4TOTAL, FREET4, T3FREE, THYROIDAB in the last 72 hours. Anemia Panel: No results for input(s): VITAMINB12, FOLATE, FERRITIN, TIBC, IRON, RETICCTPCT in the last 72 hours. Sepsis Labs: No results for input(s): PROCALCITON, LATICACIDVEN in the last 168 hours.  Recent Results (from the past 240 hour(s))  Resp Panel by RT-PCR (Flu A&B, Covid) Nasopharyngeal Swab     Status: None   Collection Time: 03/17/21 12:38 AM   Specimen: Nasopharyngeal Swab; Nasopharyngeal(NP) swabs in vial transport medium  Result Value Ref Range Status   SARS Coronavirus 2 by RT PCR NEGATIVE NEGATIVE Final    Comment: (NOTE) SARS-CoV-2 target nucleic acids are NOT DETECTED.  The SARS-CoV-2 RNA is generally detectable in upper respiratory specimens during the acute phase of infection. The lowest concentration of SARS-CoV-2 viral copies this assay can detect is 138 copies/mL. A negative result does not preclude SARS-Cov-2 infection and should not be used as the sole basis for treatment or other patient management decisions. A negative result may occur with  improper specimen collection/handling, submission of specimen other than nasopharyngeal swab, presence of viral mutation(s) within the areas targeted by this assay, and inadequate number of viral copies(<138 copies/mL). A negative result must be combined with clinical observations, patient history, and  epidemiological information. The expected result is Negative.  Fact Sheet for Patients:  EntrepreneurPulse.com.au  Fact Sheet for Healthcare Providers:  IncredibleEmployment.be  This test is no t yet approved or cleared by the [place] FDA and  has been authorized for detection and/or diagnosis of SARS-CoV-2 by FDA under an Emergency Use Authorization (EUA). This EUA will remain  in effect (meaning this test can be used) for the duration of the COVID-19 declaration under Section 564(b)(1) of the Act, 21 U.S.C.section 360bbb-3(b)(1), unless the authorization is terminated  or revoked sooner.       Influenza A by PCR NEGATIVE NEGATIVE Final   Influenza B by PCR NEGATIVE NEGATIVE Final    Comment: (NOTE) The Xpert Xpress SARS-CoV-2/FLU/RSV plus assay is intended as an aid in the diagnosis of influenza from Nasopharyngeal swab specimens and should not be used as a sole basis for treatment. Nasal washings and aspirates are unacceptable for Xpert Xpress SARS-CoV-2/FLU/RSV testing.  Fact Sheet for Patients: EntrepreneurPulse.com.au  Fact Sheet for Healthcare Providers: IncredibleEmployment.be  This test is not yet approved or cleared by the [place] FDA and has been authorized for detection and/or diagnosis of SARS-CoV-2 by FDA under an Emergency Use Authorization (EUA). This EUA will remain in  effect (meaning this test can be used) for the duration of the COVID-19 declaration under Section 564(b)(1) of the Act, 21 U.S.C. section 360bbb-3(b)(1), unless the authorization is terminated or revoked.  Performed at [facility], [place] [address] [place], [place] [address]   Surgical PCR screen     Status: None   Collection Time: 03/21/21  5:39 AM   Specimen: Nasal Mucosa; Nasal Swab  Result Value Ref Range Status   MRSA, PCR NEGATIVE NEGATIVE Final   Staphylococcus aureus NEGATIVE NEGATIVE Final     Comment: (NOTE) The Xpert SA Assay (FDA approved for NASAL specimens in patients 54 years of age and older), is one component of a comprehensive surveillance program. It is not intended to diagnose infection nor to guide or monitor treatment. Performed at [facility], [place] [address], [place], [place] [address]          Radiology Studies: No results found.      Scheduled Meds:  acetaminophen  650 mg Oral Q6H   amiodarone  200 mg Oral Daily   atorvastatin  40 mg Oral Daily   calcitonin (salmon)  1 spray Alternating Nares Daily   diclofenac Sodium  2 g Topical QID   docusate sodium  100 mg Oral BID   mupirocin ointment  1 application Nasal BID   pantoprazole  40 mg Oral Daily   Continuous Infusions:     LOS: 4 days    Time spent: 32 minutes    Barb Merino, MD [facility] Pager [phone]

## 2021-03-21 NOTE — Progress Notes (Signed)
Patient to IR for procedure

## 2021-03-21 NOTE — Care Management Important Message (Signed)
Important Message  Patient Details  Name: RAKSHA WOLFGANG MRN: [id number] Date of Birth: 12/27/1928   Medicare Important Message Given:  Yes     Seynabou Fults 03/21/2021, 2:17 PM

## 2021-03-22 ENCOUNTER — Encounter (HOSPITAL_COMMUNITY): Payer: Self-pay

## 2021-03-22 DIAGNOSIS — I480 Paroxysmal atrial fibrillation: Secondary | ICD-10-CM | POA: Diagnosis not present

## 2021-03-22 DIAGNOSIS — S32020A Wedge compression fracture of second lumbar vertebra, initial encounter for closed fracture: Secondary | ICD-10-CM | POA: Diagnosis not present

## 2021-03-22 DIAGNOSIS — I10 Essential (primary) hypertension: Secondary | ICD-10-CM | POA: Diagnosis not present

## 2021-03-22 LAB — SARS CORONAVIRUS 2 (TAT 6-24 HRS): SARS Coronavirus 2: NEGATIVE

## 2021-03-22 MED ORDER — RIVAROXABAN 20 MG PO TABS: 20.0000 mg | ORAL_TABLET | Freq: Every day | ORAL | Status: DC

## 2021-03-22 MED ORDER — INFLUENZA VAC A&B SA ADJ QUAD 0.5 ML IM PRSY
0.5000 mL | PREFILLED_SYRINGE | INTRAMUSCULAR | Status: AC
  Administered 2021-03-23: 0.5 mL via INTRAMUSCULAR
  Filled 2021-03-22: qty 0.5

## 2021-03-22 MED ORDER — DOCUSATE SODIUM 100 MG PO CAPS: 100.0000 mg | ORAL_CAPSULE | Freq: Two times a day (BID) | ORAL | 0 refills | Status: DC

## 2021-03-22 MED ORDER — RIVAROXABAN 15 MG PO TABS
15.0000 mg | ORAL_TABLET | Freq: Every day | ORAL | Status: DC
  Administered 2021-03-22: 15 mg via ORAL
  Filled 2021-03-22: qty 1

## 2021-03-22 MED ORDER — OXYCODONE HCL 5 MG PO TABS: 5.0000 mg | ORAL_TABLET | ORAL | 0 refills | Status: DC | PRN

## 2021-03-22 NOTE — Discharge Summary (Signed)
Physician Discharge Summary  [name] Small WLN:[id number] DOB: 12/28/1928 DOA: 03/16/2021  PCP: Billie Ruddy, MD  Admit date: 03/16/2021 Discharge date: 03/22/2021  Admitted From: Home Disposition: Skilled nursing facility  Recommendations for Outpatient Follow-up:  Follow up with PCP in 1-2 weeks Please obtain BMP/CBC in one week Please follow up on the following pending results:  Home Health: N/A Equipment/Devices: N/A  Discharge Condition: Stable CODE STATUS: Full code Diet recommendation: Low-salt diet  Discharge summary: 87 year old with history of chronic A. fib, sick sinus syndrome status post permanent pacemaker, hypertension, CKD stage IIIa and dyslipidemia brought to the hospital with unrelenting pain after a fall at home 10 days ago.  Using opiates without pain relief.  In the emergency room hypertensive otherwise hemodynamically stable.  MRI consistent withL2 compression fracture with more than 50% height loss.  Admitted for symptom management.    Assessment & plan of care:   Closed traumatic vertebral fracture with unrelenting pain,L2 compression fracture: MRI consistent with superior endplate fracture with more than 50% height loss. Vertebroplasty L2 by IR on 10/31 with good response to pain relief. Continue Tylenol, oxycodone along with bowel regimen.  Continue mobility.  Local NSAID gel.  Continue PT OT.    Essential hypertension: Blood pressures greatly elevated on presentation.  Improved with pain management.  She had been on losartan and metoprolol in the past that she was not taking. She will go back on losartan 50 mg and metoprolol 25 mg twice a day.  Renal functions are stable so she should tolerate the antihypertensives.  Dyslipidemia: Stable on statin.  Continue.  CKD stage IIIa: Is stable.  At about her baseline.   Paroxysmal A. fib: Permanent pacemaker present.  Currently in sinus rhythm.  On amiodarone and metoprolol.  Xarelto was on hold for  kyphoplasty.  Resume tonight.  Patient is medically stabilized.  She is able to be transferred to skilled rehab today.  Discharge Diagnoses:  Principal Problem:   Compression fracture of L2 (HCC) Active Problems:   Dyslipidemia   Osteoporosis   Low back pain   Hypertensive urgency   PAF (paroxysmal atrial fibrillation) (HCC)   Lumbar compression fracture ([place])   Hypertension    Discharge Instructions  Discharge Instructions     Call MD for:  redness, tenderness, or signs of infection (pain, swelling, redness, odor or green/yellow discharge around incision site)   Complete by: As directed    Call MD for:  severe uncontrolled pain   Complete by: As directed    Diet - low sodium heart healthy   Complete by: As directed    Increase activity slowly   Complete by: As directed    No dressing needed   Complete by: As directed       Allergies as of 03/22/2021       Reactions   Septra Ds [sulfamethoxazole-trimethoprim]    Lisinopril Other (See Comments)   Cough        Medication List     STOP taking these medications    CALTRATE 600+D PO   diclofenac sodium 1 % Gel Commonly known as: VOLTAREN   traMADol 50 MG tablet Commonly known as: ULTRAM       TAKE these medications    acetaminophen 500 MG tablet Commonly known as: TYLENOL Take 1 tablet (500 mg total) by mouth every 6 (six) hours as needed. What changed: Another medication with the same name was removed. Continue taking this medication, and follow the directions you see here.  amiodarone 200 MG tablet Commonly known as: PACERONE TAKE 1 TABLET(200 MG) BY MOUTH DAILY What changed: See the new instructions.   atorvastatin 40 MG tablet Commonly known as: LIPITOR TAKE 1 TABLET(40 MG) BY MOUTH DAILY What changed: See the new instructions.   calcitonin (salmon) 200 UNIT/ACT nasal spray Commonly known as: Miacalcin Place 1 spray into alternate nostrils daily.   docusate sodium 100 MG  capsule Commonly known as: COLACE Take 1 capsule (100 mg total) by mouth 2 (two) times daily.   losartan 50 MG tablet Commonly known as: Cozaar Take 1 tablet (50 mg total) by mouth daily.   metoprolol tartrate 25 MG tablet Commonly known as: LOPRESSOR Take 1 tablet (25 mg total) by mouth 2 (two) times daily.   oxyCODONE 5 MG immediate release tablet Commonly known as: Oxy IR/ROXICODONE Take 1 tablet (5 mg total) by mouth every 4 (four) hours as needed for severe pain.   REFRESH OP Place 1 drop into both eyes daily.   Xarelto 15 MG Tabs tablet Generic drug: Rivaroxaban TAKE 1 TABLET(15 MG) BY MOUTH DAILY WITH DINNER What changed: See the new instructions.               Discharge Care Instructions  (From admission, onward)           Start     Ordered   03/22/21 0000  No dressing needed        03/22/21 3710            Follow-up Information     Billie Ruddy, MD Follow up in 2 week(s).   Specialty: Family Medicine Contact information: [place] [address] [phone]                Allergies  Allergen Reactions   Septra Ds [Sulfamethoxazole-Trimethoprim]    Lisinopril Other (See Comments)    Cough    Consultations: Interventional radiology   Procedures/Studies: DG Lumbar Spine Complete  Result Date: 03/07/2021 CLINICAL DATA:  Fall with lower back pain. EXAM: LUMBAR SPINE - COMPLETE 4+ VIEW COMPARISON:  X-ray sacrum and coccyx 10/18/2016. CT KUB 12/13/2019. FINDINGS: The bones are osteopenic. There is new compression deformity of the superior endplate of L2 with 09% loss vertebral body height. Vertebral body heights are otherwise well maintained. There is mild dextroconvex curvature of the lumbar spine which may be positional. Spinal alignment appears within normal limits. There is mild disc space narrowing throughout the lumbar spine which is stable. There are atherosclerotic calcifications of the aorta.  Left-sided hip screw is partially visualized. IMPRESSION: 1. 25% compression deformity superior endplate of L2 is new from 12/13/2019. Please correlate clinically for acuity. 2. Diffuse osteopenia. 3. Mild degenerative changes are stable. 4.  Aortic Atherosclerosis (ICD10-I70.0). Electronically Signed   By: Ronney Asters M.D.   On: 03/07/2021 21:44   CT Head Wo Contrast  Result Date: 03/07/2021 CLINICAL DATA:  Fall, on blood thinners EXAM: CT HEAD WITHOUT CONTRAST TECHNIQUE: Contiguous axial images were obtained from the base of the skull through the vertex without intravenous contrast. COMPARISON:  12/27/2015 FINDINGS: Brain: There is atrophy and chronic small vessel disease changes. No acute intracranial abnormality. Specifically, no hemorrhage, hydrocephalus, mass lesion, acute infarction, or significant intracranial injury. Vascular: No hyperdense vessel or unexpected calcification. Skull: No acute calvarial abnormality. Sinuses/Orbits: No acute findings Other: 90 IMPRESSION: Atrophy, chronic microvascular disease. No acute intracranial abnormality. Electronically Signed   By: Rolm Baptise M.D.   On: 03/07/2021 22:46  MR LUMBAR SPINE WO CONTRAST  Result Date: 03/17/2021 CLINICAL DATA:  Spine fracture, lumbosacral, traumatic worsening lower lumbar and sacral pain after fall 10 days ago, is anticoagulated EXAM: MRI LUMBAR SPINE WITHOUT CONTRAST TECHNIQUE: Multiplanar, multisequence MR imaging of the lumbar spine was performed. No intravenous contrast was administered. COMPARISON:  Lumbar radiographs March 07, 2021. 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 Alignment: Mild (grade 1) anterolisthesis of L5 on S1. L2 bony retropulsion is detailed below. Vertebrae: Acute/recent L2 superior endplate fracture with approximately 50% height loss and 5-6 mm of bony retropulsion. Associated marrow edema with linear T2 hyperintense fracture cleft through the  superior endplate. Bilateral pedicles are involved with marrow edema. Conus medullaris and cauda equina: Conus extends to the L1-L2 level. Conus appears normal. Paraspinal and other soft tissues: Right larger than left renal cysts, suboptimally evaluated due to motion. Disc levels: T12-L1: Only imaged sagittally without evidence of significant canal or foraminal stenosis. L1-L2: Approximately 5-6 mm of bony retropulsion along the superior endplate due to fracture described above. Resulting mild canal stenosis along the superior L2 vertebral body. No significant foraminal stenosis. L2-L3: Slight disc bulging and mild facet arthropathy without significant canal or foraminal stenosis. L3-L4: Mild posterior disc height loss. Mild broad disc bulge with superimposed bilateral foraminal disc protrusions, larger on the left. Mild bilateral facet arthropathy. Resulting moderate left foraminal stenosis without significant canal or right foraminal stenosis. Mild left subarticular recess narrowing. L4-L5: Mild posterior disc height loss. Mild disc bulging and mild bilateral facet arthropathy without significant canal or foraminal stenosis L5-S1: Grade 1 anterolisthesis. Uncovering the disc with right eccentric disc bulge. Right greater than left facet arthropathy. No significant canal or foraminal stenosis. S1-S2: Transitional anatomy. No significant canal or foraminal stenosis. IMPRESSION: 1. 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 2. Acute/recent L2 superior endplate fracture with 98% height loss, 5-6 mm of bony retropulsion, and involvement of bilateral pedicles. Resulting mild canal stenosis. 3. At L3-L4, moderate left foraminal stenosis. Electronically Signed   By: Margaretha Sheffield M.D.   On: 03/17/2021 12:27   IR KYPHO EA ADDL LEVEL THORACIC OR LUMBAR  Result Date: 03/21/2021 INDICATION: PALLAS WAHLERT is a 87 y.o. female with PMH of A. fib, bleeding stomach ulcer, HTN, hyperlipidemia,  osteoporosis and presence of cardiac pacemaker. Patient presented to the ED 03/16/2021 complaining of back pain since her fall on 03/07/2021. Patient's x-ray at that time showed L2 compression fracture with 25% height loss. Patient has been taking Tylenol without relief and having difficulty walking with her walker. MRI was performed at the emergency visit on 03/16/2021 which resulted acute L2 superior endplate fracture with 11% height loss, 5 to 6 mm bony retropulsion and involvement of bilateral pedicles. Due to persistent intractable back pain, patient comes to our service today for and L2 kyphoplasty. EXAM: FLUOROSCOPY GUIDED L2 VERTEBRAL BODY CORE BONE BIOPSY AND BALLOON KYPHOPLASTY COMPARISON:  MRI of the lumbar spine March 17, 2021. MEDICATIONS: As antibiotic prophylaxis, Ancef 2 gm IV was ordered pre-procedure and administered intravenously within 1 hour of incision. ANESTHESIA/SEDATION: Moderate (conscious) sedation was employed during this procedure. A total of Versed 1.5 mg and Fentanyl 75 mcg was administered intravenously by the radiology nurse. Total intra-service moderate Sedation Time: 48 minutes. The patient's level of consciousness and vital signs were monitored continuously by radiology nursing throughout the procedure under my direct supervision. FLUOROSCOPY TIME:  Fluoroscopy Time: 14.8 minutes (DAP 914 mGy) COMPLICATIONS: None immediate. PROCEDURE: Following a full explanation  of the procedure along with the potential associated complications, an informed witnessed consent was obtained. The patient was placed in prone position on the angiography table. The lumbar spine region was prepped and draped in a sterile fashion. Under fluoroscopy, the L2 vertebral body was delineated and the skin area was marked. The skin was infiltrated with a 1% Lidocaine approximately 4 cm lateral to the spinous process projection on the right. Using a 22-gauge spinal needle, the soft issue and the peripedicular  space and periosteum were infiltrated with Bupivacaine 0.5%. A skin incision was made at the access site. Subsequently, an 11-gauge Kyphon trocar was inserted under fluoroscopic guidance until contact with the pedicle was obtained. The trocar was inserted under light hammer tapping into the pedicle until the posterior boundaries of the vertebral body was reached. The diamond mandrill was removed and one core biopsy wasobtained. The skin was infiltrated with a 1% Lidocaine approximately 4 cm lateral to the spinous process projection on the left. Using a 22-gauge spinal needle, the soft issue and the peripedicular space and periosteum were infiltrated with Bupivacaine 0.5%. A skin incision was made at the access site. Subsequently, an 11-gauge Kyphon trocar was inserted under fluoroscopic guidance until contact with the pedicle was obtained. The trocar was inserted under light hammer tapping into the pedicle until the posterior boundaries of the vertebral body was reached. The diamond mandrill was removed. A bone drill was coaxially advanced within the anterior third of the vertebral body and then exchanged for inflatable Kyphon balloons. These were centered within the mid-aspect of the vertebral body. The balloons were inflated to create a void to serve as a repository for the bone cement. Both balloons were deflated and through both cannulas, under continuous fluoroscopy guidance in the AP and lateral views, the vertebral body was filled with previously mixed polymethyl-methacrylate (PMMA) added to barium for opacification. Both cannulas were later removed. The access sites were cleaned and covered with a sterile bandage. IMPRESSION: Successful fluoroscopy-guided bilateral transpedicular approach for L2 vertebral body core bone biopsy and kyphoplasty for treatment of osteoporotic fragility fracture. Bone sample obtained was sent for tissue exam. Electronically Signed   By: Pedro Earls M.D.   On:  03/21/2021 14:32   (Echo, Carotid, EGD, Colonoscopy, ERCP)    Subjective: Patient seen and examined.  No overnight events.  Denies any pain today.  Inconsistent in history, however she tells me that her pain is all gone after the procedure.  She was very happy about that.  Agreeable to go to rehab today.  She wants to have a flu vaccine.   Discharge Exam: Vitals:   03/22/21 0300 03/22/21 0735  BP: (!) 154/84 (!) 151/91  Pulse: 66 69  Resp: 20 18  Temp: 98.6 F (37 C) 98.1 F (36.7 C)  SpO2: 97% 96%   Vitals:   03/21/21 1905 03/21/21 2302 03/22/21 0300 03/22/21 0735  BP: (!) 115/46 (!) 138/52 (!) 154/84 (!) 151/91  Pulse: 67 71 66 69  Resp: 20 20 20 18   Temp: 98 F (36.7 C) 98.6 F (37 C) 98.6 F (37 C) 98.1 F (36.7 C)  TempSrc: Oral Oral Oral Oral  SpO2: 98% 98% 97% 96%  Weight:      Height:        General: Pt is alert, awake, not in acute distress Cardiovascular: RRR, S1/S2 +, no rubs, no gallops Respiratory: CTA bilaterally, no wheezing, no rhonchi Abdominal: Soft, NT, ND, bowel sounds + Extremities: no edema, no  cyanosis Lower spine with dry dressing, no tenderness.    The results of significant diagnostics from this hospitalization (including imaging, microbiology, ancillary and laboratory) are listed below for reference.     Microbiology: Recent Results (from the past 240 hour(s))  Resp Panel by RT-PCR (Flu A&B, Covid) Nasopharyngeal Swab     Status: None   Collection Time: 03/17/21 12:38 AM   Specimen: Nasopharyngeal Swab; Nasopharyngeal(NP) swabs in vial transport medium  Result Value Ref Range Status   SARS Coronavirus 2 by RT PCR NEGATIVE NEGATIVE Final    Comment: (NOTE) SARS-CoV-2 target nucleic acids are NOT DETECTED.  The SARS-CoV-2 RNA is generally detectable in upper respiratory specimens during the acute phase of infection. The lowest concentration of SARS-CoV-2 viral copies this assay can detect is 138 copies/mL. A negative result does not  preclude SARS-Cov-2 infection and should not be used as the sole basis for treatment or other patient management decisions. A negative result may occur with  improper specimen collection/handling, submission of specimen other than nasopharyngeal swab, presence of viral mutation(s) within the areas targeted by this assay, and inadequate number of viral copies(<138 copies/mL). A negative result must be combined with clinical observations, patient history, and epidemiological information. The expected result is Negative.  Fact Sheet for Patients:  EntrepreneurPulse.com.au  Fact Sheet for Healthcare Providers:  IncredibleEmployment.be  This test is no t yet approved or cleared by the [place] FDA and  has been authorized for detection and/or diagnosis of SARS-CoV-2 by FDA under an Emergency Use Authorization (EUA). This EUA will remain  in effect (meaning this test can be used) for the duration of the COVID-19 declaration under Section 564(b)(1) of the Act, 21 U.S.C.section 360bbb-3(b)(1), unless the authorization is terminated  or revoked sooner.       Influenza A by PCR NEGATIVE NEGATIVE Final   Influenza B by PCR NEGATIVE NEGATIVE Final    Comment: (NOTE) The Xpert Xpress SARS-CoV-2/FLU/RSV plus assay is intended as an aid in the diagnosis of influenza from Nasopharyngeal swab specimens and should not be used as a sole basis for treatment. Nasal washings and aspirates are unacceptable for Xpert Xpress SARS-CoV-2/FLU/RSV testing.  Fact Sheet for Patients: EntrepreneurPulse.com.au  Fact Sheet for Healthcare Providers: IncredibleEmployment.be  This test is not yet approved or cleared by the [place] FDA and has been authorized for detection and/or diagnosis of SARS-CoV-2 by FDA under an Emergency Use Authorization (EUA). This EUA will remain in effect (meaning this test can be used) for the  duration of the COVID-19 declaration under Section 564(b)(1) of the Act, 21 U.S.C. section 360bbb-3(b)(1), unless the authorization is terminated or revoked.  Performed at [facility], [place] [address], [place], [place] [address]   Surgical PCR screen     Status: None   Collection Time: 03/21/21  5:39 AM   Specimen: Nasal Mucosa; Nasal Swab  Result Value Ref Range Status   MRSA, PCR NEGATIVE NEGATIVE Final   Staphylococcus aureus NEGATIVE NEGATIVE Final    Comment: (NOTE) The Xpert SA Assay (FDA approved for NASAL specimens in patients 72 years of age and older), is one component of a comprehensive surveillance program. It is not intended to diagnose infection nor to guide or monitor treatment. Performed at [facility], [place] [address], [place], [place] [address]      Labs: BNP (last 3 results) No results for input(s): BNP in the last 8760 hours. Basic Metabolic Panel: Recent Labs  Lab 03/16/21 1219 03/17/21 0543  03/18/21 0333  NA 138 136 136  K 3.6 3.6 3.7  CL 105 104 103  CO2 23 22 26   GLUCOSE 101* 103* 120*  BUN 14 15 21   CREATININE 1.13* 1.20* 1.22*  CALCIUM 9.1 9.4 9.3   Liver Function Tests: No results for input(s): AST, ALT, ALKPHOS, BILITOT, PROT, ALBUMIN in the last 168 hours. No results for input(s): LIPASE, AMYLASE in the last 168 hours. No results for input(s): AMMONIA in the last 168 hours. CBC: Recent Labs  Lab 03/16/21 1219 03/17/21 0543 03/18/21 0333  WBC 8.7 9.2 10.0  HGB 12.9 13.5 12.7  HCT 40.5 40.9 39.2  MCV 94.4 93.8 92.7  PLT 242 261 255   Cardiac Enzymes: No results for input(s): CKTOTAL, CKMB, CKMBINDEX, TROPONINI in the last 168 hours. BNP: Invalid input(s): POCBNP CBG: No results for input(s): GLUCAP in the last 168 hours. D-Dimer No results for input(s): DDIMER in the last 72 hours. Hgb A1c No results for input(s): HGBA1C in the last 72 hours. Lipid Profile No results for input(s): CHOL, HDL, LDLCALC, TRIG,  CHOLHDL, LDLDIRECT in the last 72 hours. Thyroid function studies No results for input(s): TSH, T4TOTAL, T3FREE, THYROIDAB in the last 72 hours.  Invalid input(s): FREET3 Anemia work up No results for input(s): VITAMINB12, FOLATE, FERRITIN, TIBC, IRON, RETICCTPCT in the last 72 hours. Urinalysis    Component Value Date/Time   COLORURINE YELLOW 11/24/2015 0910   APPEARANCEUR CLOUDY (A) 11/24/2015 0910   LABSPEC 1.017 11/24/2015 0910   PHURINE 6.5 11/24/2015 0910   GLUCOSEU NEGATIVE 11/24/2015 0910   HGBUR NEGATIVE 11/24/2015 0910   HGBUR negative 06/16/2008 0000   BILIRUBINUR negative 02/02/2020 1029   KETONESUR NEGATIVE 11/24/2015 0910   PROTEINUR Negative 02/02/2020 1029   PROTEINUR NEGATIVE 11/24/2015 0910   UROBILINOGEN 0.2 02/02/2020 1029   UROBILINOGEN 0.2 01/27/2015 1052   NITRITE negative 02/02/2020 1029   NITRITE NEGATIVE 11/24/2015 0910   LEUKOCYTESUR Small (1+) (A) 02/02/2020 1029   Sepsis Labs Invalid input(s): PROCALCITONIN,  WBC,  LACTICIDVEN Microbiology Recent Results (from the past 240 hour(s))  Resp Panel by RT-PCR (Flu A&B, Covid) Nasopharyngeal Swab     Status: None   Collection Time: 03/17/21 12:38 AM   Specimen: Nasopharyngeal Swab; Nasopharyngeal(NP) swabs in vial transport medium  Result Value Ref Range Status   SARS Coronavirus 2 by RT PCR NEGATIVE NEGATIVE Final    Comment: (NOTE) SARS-CoV-2 target nucleic acids are NOT DETECTED.  The SARS-CoV-2 RNA is generally detectable in upper respiratory specimens during the acute phase of infection. The lowest concentration of SARS-CoV-2 viral copies this assay can detect is 138 copies/mL. A negative result does not preclude SARS-Cov-2 infection and should not be used as the sole basis for treatment or other patient management decisions. A negative result may occur with  improper specimen collection/handling, submission of specimen other than nasopharyngeal swab, presence of viral mutation(s) within  the areas targeted by this assay, and inadequate number of viral copies(<138 copies/mL). A negative result must be combined with clinical observations, patient history, and epidemiological information. The expected result is Negative.  Fact Sheet for Patients:  EntrepreneurPulse.com.au  Fact Sheet for Healthcare Providers:  IncredibleEmployment.be  This test is no t yet approved or cleared by the [place] FDA and  has been authorized for detection and/or diagnosis of SARS-CoV-2 by FDA under an Emergency Use Authorization (EUA). This EUA will remain  in effect (meaning this test can be used) for the duration of the COVID-19 declaration under Section 564(b)(1)  of the Act, 21 U.S.C.section 360bbb-3(b)(1), unless the authorization is terminated  or revoked sooner.       Influenza A by PCR NEGATIVE NEGATIVE Final   Influenza B by PCR NEGATIVE NEGATIVE Final    Comment: (NOTE) The Xpert Xpress SARS-CoV-2/FLU/RSV plus assay is intended as an aid in the diagnosis of influenza from Nasopharyngeal swab specimens and should not be used as a sole basis for treatment. Nasal washings and aspirates are unacceptable for Xpert Xpress SARS-CoV-2/FLU/RSV testing.  Fact Sheet for Patients: EntrepreneurPulse.com.au  Fact Sheet for Healthcare Providers: IncredibleEmployment.be  This test is not yet approved or cleared by the [place] FDA and has been authorized for detection and/or diagnosis of SARS-CoV-2 by FDA under an Emergency Use Authorization (EUA). This EUA will remain in effect (meaning this test can be used) for the duration of the COVID-19 declaration under Section 564(b)(1) of the Act, 21 U.S.C. section 360bbb-3(b)(1), unless the authorization is terminated or revoked.  Performed at [facility], [place] [address] [place], [place] [address]   Surgical PCR screen     Status: None   Collection  Time: 03/21/21  5:39 AM   Specimen: Nasal Mucosa; Nasal Swab  Result Value Ref Range Status   MRSA, PCR NEGATIVE NEGATIVE Final   Staphylococcus aureus NEGATIVE NEGATIVE Final    Comment: (NOTE) The Xpert SA Assay (FDA approved for NASAL specimens in patients 22 years of age and older), is one component of a comprehensive surveillance program. It is not intended to diagnose infection nor to guide or monitor treatment. Performed at [facility], [place] [address], [place], [place] [address]      Time coordinating discharge: 35 minutes  SIGNED:   Barb Merino, MD  [facility] 03/22/2021, 11:05 AM

## 2021-03-22 NOTE — Progress Notes (Signed)
Physical Therapy Treatment Patient Details Name: [name] Small MRN: [id number] DOB: 1928/07/04 Today's Date: 03/22/2021   History of Present Illness 87 yo female admitted with back pain. MRI: acute/recent L2 superior endplate fracture with 40% height loss,  5-6 mm of bony retropulsion, and involvement of bilateral pedicles.  Resulting mild canal stenosis.  3. At L3-L4, moderate left foraminal stenosis. PHMx: CA (melanoma of leg), HTN, L femur IM nail 2016,atrial fibrillation, sick sinus syndrome status post pacemaker placement,fall on March 07, 2021 with x-rays showing a L2 compression fracture, CKD III    PT Comments    Pt received in chair and reports she has been sitting about 1.5 hrs and back pain beginning to increase. Pt had some relief with mvmt and tolerated increased distance. Continuing to work on fwd gaze and abdominal activation to have as erect posture as she can. Pt fatigued after 150' with RW and requested to return to bed. PT will continue to follow.    Recommendations for follow up therapy are one component of a multi-disciplinary discharge planning process, led by the attending physician.  Recommendations may be updated based on patient status, additional functional criteria and insurance authorization.  Follow Up Recommendations  Skilled nursing-short term rehab (<3 hours/day)     Assistance Recommended at Discharge Frequent or constant Supervision/Assistance  Equipment Recommendations  Rolling walker (2 wheels)    Recommendations for Other Services       Precautions / Restrictions Precautions Precautions: Fall;Back Precaution Booklet Issued: No Precaution Comments: followed back precautions for comfort Restrictions Weight Bearing Restrictions: No     Mobility  Bed Mobility Overal bed mobility: Needs Assistance Bed Mobility: Sit to Sidelying         Sit to sidelying: Min guard General bed mobility comments: min guard for safety, increased time required to  complete and use of bed rails. Pt able to bridge knees and scoot hips with cues    Transfers Overall transfer level: Needs assistance Equipment used: Rolling walker (2 wheels) Transfers: Sit to/from Stand Sit to Stand: Min guard           General transfer comment: min-guard for safety and vc's for hand placement    Ambulation/Gait Ambulation/Gait assistance: Min guard Gait Distance (Feet): 150 Feet Assistive device: Rolling walker (2 wheels) Gait Pattern/deviations: Step-through pattern;Decreased stride length;Narrow base of support;Trunk flexed Gait velocity: decreased Gait velocity interpretation: <1.31 ft/sec, indicative of household ambulator General Gait Details: vc's for forward gaze and abdominal activation to optimize posture within her limitations   Stairs             Wheelchair Mobility    Modified Rankin (Stroke Patients Only)       Balance Overall balance assessment: Needs assistance;History of Falls Sitting-balance support: No upper extremity supported;Feet supported Sitting balance-Leahy Scale: Fair     Standing balance support: Bilateral upper extremity supported;During functional activity;Reliant on assistive device for balance Standing balance-Leahy Scale: Poor Standing balance comment: reliant on UE support                            Cognition Arousal/Alertness: Awake/alert Behavior During Therapy: WFL for tasks assessed/performed Overall Cognitive Status: Impaired/Different from baseline Area of Impairment: Memory;Awareness;Safety/judgement                     Memory: Decreased short-term memory   Safety/Judgement: Decreased awareness of safety;Decreased awareness of deficits Awareness: Emergent   General Comments: pt able to  recall activities within session today, worked on pathfinding in hallway, pt needed vc's        Exercises      General Comments General comments (skin integrity, edema, etc.): reviewed  proper posture and precautions      Pertinent Vitals/Pain Pain Assessment: Faces Faces Pain Scale: Hurts little more Pain Location: back Pain Descriptors / Indicators: Discomfort;Grimacing Pain Intervention(s): Limited activity within patient's tolerance;Monitored during session    Home Living                          Prior Function            PT Goals (current goals can now be found in the care plan section) Acute Rehab PT Goals Patient Stated Goal: to get better PT Goal Formulation: With patient/family Time For Goal Achievement: 04/01/21 Potential to Achieve Goals: Fair Progress towards PT goals: Progressing toward goals    Frequency    Min 5X/week      PT Plan Current plan remains appropriate    Co-evaluation              AM-PAC PT "6 Clicks" Mobility   Outcome Measure  Help needed turning from your back to your side while in a flat bed without using bedrails?: A Little Help needed moving from lying on your back to sitting on the side of a flat bed without using bedrails?: A Little Help needed moving to and from a bed to a chair (including a wheelchair)?: A Little Help needed standing up from a chair using your arms (e.g., wheelchair or bedside chair)?: A Little Help needed to walk in hospital room?: A Little Help needed climbing 3-5 steps with a railing? : A Lot 6 Click Score: 17    End of Session Equipment Utilized During Treatment: Gait belt Activity Tolerance: Patient tolerated treatment well Patient left: in bed;with call bell/phone within reach;with bed alarm set Nurse Communication: Mobility status PT Visit Diagnosis: Unsteadiness on feet (R26.81);Muscle weakness (generalized) (M62.81);History of falling (Z91.81)     Time: 9702-6378 PT Time Calculation (min) (ACUTE ONLY): 13 min  Charges:  $Gait Training: 8-22 mins                     Leighton Roach, [place]  Pager [phone] Office  [place] 03/22/2021, 4:35 PM

## 2021-03-22 NOTE — TOC Progression Note (Signed)
Transition of Care [facility]) - Progression Note    Patient Details  Name: GELSEY AMYX MRN: [id number] Date of Birth: Dec 03, 1928  Transition of Care [facility]) CM/SW [place], [place] Phone Number: 03/22/2021, 4:31 PM  Clinical Narrative:     UPDATE: CSW spoke with [facility]- sent d/c sumamry- they can admit tomorrow, providing we get insurance authorization.  CSW called [facility]- unable to reach admission- informed family about selecting another SNF because patient has been discharged- family states they will call back because they were informed have a bed for her there- CSW waiting on returned call.  CSW will continue to follow and assist with discharge planning.   Thurmond Butts, MSW, LCSW Clinical Social Worker    Expected Discharge Plan: Skilled Nursing Facility Barriers to Discharge: SNF Pending bed offer  Expected Discharge Plan and Services Expected Discharge Plan: [place] Choice: [place]   Expected Discharge Date: 03/22/21                                     Social Determinants of Health (SDOH) Interventions    Readmission Risk Interventions No flowsheet data found.

## 2021-03-22 NOTE — Progress Notes (Signed)
Occupational Therapy Treatment Patient Details Name: [name] Small MRN: [id number] DOB: Aug 05, 1928 Today's Date: 03/22/2021   History of present illness 87 yo female admitted with back pain. MRI: acute/recent L2 superior endplate fracture with 01% height loss,  5-6 mm of bony retropulsion, and involvement of bilateral pedicles.  Resulting mild canal stenosis.  3. At L3-L4, moderate left foraminal stenosis. PHMx: CA (melanoma of leg), HTN, L femur IM nail 2016,atrial fibrillation, sick sinus syndrome status post pacemaker placement,fall on March 07, 2021 with x-rays showing a L2 compression fracture, CKD III   OT comments  Pt progressed from bed to bathroom transfers this session with min (A) RW and min guard for hygiene. Pt needed cues for back precautions with bed mobility. Recommendation for Snf at this time.    Recommendations for follow up therapy are one component of a multi-disciplinary discharge planning process, led by the attending physician.  Recommendations may be updated based on patient status, additional functional criteria and insurance authorization.    Follow Up Recommendations  Skilled nursing-short term rehab (<3 hours/day)    Assistance Recommended at Discharge Frequent or constant Supervision/Assistance  Equipment Recommendations  Other (comment)    Recommendations for Other Services      Precautions / Restrictions Precautions Precautions: Fall;Back Precaution Booklet Issued: No Precaution Comments: followed back precautions for comfort Restrictions Weight Bearing Restrictions: No       Mobility Bed Mobility Overal bed mobility: Needs Assistance Bed Mobility: Sit to Supine Rolling: Min guard Sidelying to sit: Min guard     Sit to sidelying: Min guard General bed mobility comments: min guard for safety, increased time required to complete and use of bed rails. Pt able to bridge knees and scoot hips with cues    Transfers Overall transfer level: Needs  assistance Equipment used: Rolling walker (2 wheels) Transfers: Sit to/from Stand Sit to Stand: Min guard           General transfer comment: min-guard for safety and vc's for hand placement     Balance Overall balance assessment: Needs assistance;History of Falls Sitting-balance support: No upper extremity supported;Feet supported Sitting balance-Leahy Scale: Fair     Standing balance support: Bilateral upper extremity supported;During functional activity;Reliant on assistive device for balance Standing balance-Leahy Scale: Poor Standing balance comment: reliant on UE support                           ADL either performed or assessed with clinical judgement   ADL Overall ADL's : Needs assistance/impaired Eating/Feeding: Modified independent   Grooming: Set up;Standing               Lower Body Dressing: Minimal assistance;Sitting/lateral leans Lower Body Dressing Details (indicate cue type and reason): figure 4 cross to reach sock Toilet Transfer: Min guard;Rolling walker (2 wheels);BSC   Toileting- Clothing Manipulation and Hygiene: Min guard;Sitting/lateral lean       Functional mobility during ADLs: Min guard;Rolling walker (2 wheels) General ADL Comments: pt voiding and washing hands this session. pt with brief and purewick on upon arrival. pt demonstrates ability to use BSC over toilet so d/c purewick while up in chair. Rn made aware pt is only on chuck pad     Vision       Perception     Praxis      Cognition Arousal/Alertness: Awake/alert Behavior During Therapy: WFL for tasks assessed/performed Overall Cognitive Status: Impaired/Different from baseline Area of Impairment: Memory;Awareness;Safety/judgement  Memory: Decreased recall of precautions;Decreased short-term memory   Safety/Judgement: Decreased awareness of safety Awareness: Emergent   General Comments: recall of back injury           Exercises     Shoulder Instructions       General Comments reviewed proper posture and precautions    Pertinent Vitals/ Pain       Pain Assessment: Faces Faces Pain Scale: Hurts little more Pain Location: back Pain Descriptors / Indicators: Discomfort;Grimacing Pain Intervention(s): Limited activity within patient's tolerance;Monitored during session  Home Living                                          Prior Functioning/Environment              Frequency  Min 2X/week        Progress Toward Goals  OT Goals(current goals can now be found in the care plan section)  Progress towards OT goals: Progressing toward goals  Acute Rehab OT Goals Patient Stated Goal: to have lunch soon OT Goal Formulation: With patient Time For Goal Achievement: 04/01/21 Potential to Achieve Goals: Good ADL Goals Pt Will Perform Grooming: Independently;standing Pt Will Perform Upper Body Bathing: Independently;sitting;standing Pt Will Perform Lower Body Bathing: Independently;sit to/from stand Pt Will Perform Upper Body Dressing: Independently;sitting;standing Pt Will Perform Lower Body Dressing: Independently;sit to/from stand Pt Will Transfer to Toilet: Independently;ambulating;bedside commode Pt Will Perform Toileting - Clothing Manipulation and hygiene: Independently;sit to/from stand Additional ADL Goal #1: Pt will be Mod I in and OOB for basic ADLs  Plan Discharge plan remains appropriate    Co-evaluation                 AM-PAC OT "6 Clicks" Daily Activity     Outcome Measure   Help from another person eating meals?: None Help from another person taking care of personal grooming?: A Little Help from another person toileting, which includes using toliet, bedpan, or urinal?: A Little Help from another person bathing (including washing, rinsing, drying)?: A Little Help from another person to put on and taking off regular upper body clothing?: A  Little Help from another person to put on and taking off regular lower body clothing?: A Little 6 Click Score: 19    End of Session Equipment Utilized During Treatment: Gait belt;Rolling walker (2 wheels)  OT Visit Diagnosis: Unsteadiness on feet (R26.81);Other abnormalities of gait and mobility (R26.89);Muscle weakness (generalized) (M62.81);Pain Pain - Right/Left: Left Pain - part of body: Hip   Activity Tolerance Patient tolerated treatment well   Patient Left in chair;with call bell/phone within reach;with chair alarm set;with family/visitor present   Nurse Communication Mobility status        Time: 1130 ([id number] OT Time Calculation (min): 14 min  Charges: OT General Charges $OT Visit: 1 Visit OT Treatments $Self Care/Home Management : 8-22 mins   Brynn, OTR/L  Acute Rehabilitation Services Pager: [phone] Office: [phone] .   Jeri Modena 03/22/2021, 4:54 PM

## 2021-03-22 NOTE — TOC Progression Note (Signed)
Transition of Care [facility]) - Progression Note    Patient Details  Name: [name] Small MRN: [id number] Date of Birth: April 03, 1929  Transition of Care [facility]) CM/SW [place], [place] Phone Number: 03/22/2021, 12:05 PM  Clinical Narrative:     CSW started insurance authorization  reference # [id number]  CSW called [facility] 2x to confirmed if they can admit today, if we receive insurance approval- waiting on call back  Thurmond Butts, MSW, LCSW Clinical Social Worker    Expected Discharge Plan: [place] Barriers to Discharge: SNF Pending bed offer  Expected Discharge Plan and Services Expected Discharge Plan: [place] Choice: [place]   Expected Discharge Date: 03/22/21                                     Social Determinants of Health (SDOH) Interventions    Readmission Risk Interventions No flowsheet data found.

## 2021-03-23 DIAGNOSIS — R9431 Abnormal electrocardiogram [ECG] [EKG]: Secondary | ICD-10-CM | POA: Diagnosis not present

## 2021-03-23 DIAGNOSIS — E7849 Other hyperlipidemia: Secondary | ICD-10-CM | POA: Diagnosis not present

## 2021-03-23 DIAGNOSIS — Z743 Need for continuous supervision: Secondary | ICD-10-CM | POA: Diagnosis not present

## 2021-03-23 DIAGNOSIS — M549 Dorsalgia, unspecified: Secondary | ICD-10-CM | POA: Diagnosis not present

## 2021-03-23 DIAGNOSIS — R059 Cough, unspecified: Secondary | ICD-10-CM | POA: Diagnosis not present

## 2021-03-23 DIAGNOSIS — I951 Orthostatic hypotension: Secondary | ICD-10-CM | POA: Diagnosis not present

## 2021-03-23 DIAGNOSIS — E785 Hyperlipidemia, unspecified: Secondary | ICD-10-CM | POA: Diagnosis not present

## 2021-03-23 DIAGNOSIS — I129 Hypertensive chronic kidney disease with stage 1 through stage 4 chronic kidney disease, or unspecified chronic kidney disease: Secondary | ICD-10-CM | POA: Diagnosis not present

## 2021-03-23 DIAGNOSIS — R404 Transient alteration of awareness: Secondary | ICD-10-CM | POA: Diagnosis not present

## 2021-03-23 DIAGNOSIS — K5730 Diverticulosis of large intestine without perforation or abscess without bleeding: Secondary | ICD-10-CM | POA: Diagnosis not present

## 2021-03-23 DIAGNOSIS — R5381 Other malaise: Secondary | ICD-10-CM | POA: Diagnosis not present

## 2021-03-23 DIAGNOSIS — R2689 Other abnormalities of gait and mobility: Secondary | ICD-10-CM | POA: Diagnosis not present

## 2021-03-23 DIAGNOSIS — R278 Other lack of coordination: Secondary | ICD-10-CM | POA: Diagnosis not present

## 2021-03-23 DIAGNOSIS — E876 Hypokalemia: Secondary | ICD-10-CM | POA: Diagnosis not present

## 2021-03-23 DIAGNOSIS — I10 Essential (primary) hypertension: Secondary | ICD-10-CM | POA: Diagnosis not present

## 2021-03-23 DIAGNOSIS — Z23 Encounter for immunization: Secondary | ICD-10-CM | POA: Diagnosis not present

## 2021-03-23 DIAGNOSIS — K5710 Diverticulosis of small intestine without perforation or abscess without bleeding: Secondary | ICD-10-CM | POA: Diagnosis not present

## 2021-03-23 DIAGNOSIS — I495 Sick sinus syndrome: Secondary | ICD-10-CM | POA: Diagnosis not present

## 2021-03-23 DIAGNOSIS — M818 Other osteoporosis without current pathological fracture: Secondary | ICD-10-CM | POA: Diagnosis not present

## 2021-03-23 DIAGNOSIS — I480 Paroxysmal atrial fibrillation: Secondary | ICD-10-CM | POA: Diagnosis not present

## 2021-03-23 DIAGNOSIS — S32020D Wedge compression fracture of second lumbar vertebra, subsequent encounter for fracture with routine healing: Secondary | ICD-10-CM | POA: Diagnosis not present

## 2021-03-23 DIAGNOSIS — U071 COVID-19: Secondary | ICD-10-CM | POA: Diagnosis not present

## 2021-03-23 DIAGNOSIS — R531 Weakness: Secondary | ICD-10-CM | POA: Diagnosis not present

## 2021-03-23 DIAGNOSIS — R55 Syncope and collapse: Secondary | ICD-10-CM | POA: Diagnosis not present

## 2021-03-23 DIAGNOSIS — M6281 Muscle weakness (generalized): Secondary | ICD-10-CM | POA: Diagnosis not present

## 2021-03-23 DIAGNOSIS — Z882 Allergy status to sulfonamides status: Secondary | ICD-10-CM | POA: Diagnosis not present

## 2021-03-23 DIAGNOSIS — Z950 Presence of cardiac pacemaker: Secondary | ICD-10-CM | POA: Diagnosis not present

## 2021-03-23 DIAGNOSIS — J40 Bronchitis, not specified as acute or chronic: Secondary | ICD-10-CM | POA: Diagnosis not present

## 2021-03-23 DIAGNOSIS — M810 Age-related osteoporosis without current pathological fracture: Secondary | ICD-10-CM | POA: Diagnosis not present

## 2021-03-23 DIAGNOSIS — N1831 Chronic kidney disease, stage 3a: Secondary | ICD-10-CM | POA: Diagnosis not present

## 2021-03-23 DIAGNOSIS — I4891 Unspecified atrial fibrillation: Secondary | ICD-10-CM | POA: Diagnosis not present

## 2021-03-23 DIAGNOSIS — R0902 Hypoxemia: Secondary | ICD-10-CM | POA: Diagnosis not present

## 2021-03-23 DIAGNOSIS — Z888 Allergy status to other drugs, medicaments and biological substances status: Secondary | ICD-10-CM | POA: Diagnosis not present

## 2021-03-23 DIAGNOSIS — I440 Atrioventricular block, first degree: Secondary | ICD-10-CM | POA: Diagnosis not present

## 2021-03-23 DIAGNOSIS — B9620 Unspecified Escherichia coli [E. coli] as the cause of diseases classified elsewhere: Secondary | ICD-10-CM | POA: Diagnosis present

## 2021-03-23 DIAGNOSIS — W19XXXA Unspecified fall, initial encounter: Secondary | ICD-10-CM | POA: Diagnosis not present

## 2021-03-23 DIAGNOSIS — Z7901 Long term (current) use of anticoagulants: Secondary | ICD-10-CM | POA: Diagnosis not present

## 2021-03-23 DIAGNOSIS — N390 Urinary tract infection, site not specified: Secondary | ICD-10-CM | POA: Diagnosis not present

## 2021-03-23 DIAGNOSIS — R5383 Other fatigue: Secondary | ICD-10-CM | POA: Diagnosis not present

## 2021-03-23 DIAGNOSIS — I452 Bifascicular block: Secondary | ICD-10-CM | POA: Diagnosis not present

## 2021-03-23 DIAGNOSIS — Z79899 Other long term (current) drug therapy: Secondary | ICD-10-CM | POA: Diagnosis not present

## 2021-03-23 DIAGNOSIS — Z8249 Family history of ischemic heart disease and other diseases of the circulatory system: Secondary | ICD-10-CM | POA: Diagnosis not present

## 2021-03-23 NOTE — Progress Notes (Signed)
Physical Therapy Treatment Patient Details Name: [name] Small MRN: [id number] DOB: 1929-04-14 Today's Date: 03/23/2021   History of Present Illness 87 yo female admitted with back pain. MRI: acute/recent L2 superior endplate fracture with 03% height loss,  5-6 mm of bony retropulsion, and involvement of bilateral pedicles.  Resulting mild canal stenosis.  3. At L3-L4, moderate left foraminal stenosis. S/p L2 kyphoplasty on 10/31. PHMx: CA (melanoma of leg), HTN, L femur IM nail 2016,atrial fibrillation, sick sinus syndrome status post pacemaker placement,fall on March 07, 2021 with x-rays showing a L2 compression fracture, CKD III    PT Comments    Patient progressing towards physical therapy goals. Patient ambulating 150' with RW and min guard for safety. Patient performed repeated sit to stands x 5 with emphasis on controlled sitting with intermittent follow through. Patient's cognition seems to be improving since admission. Continue to recommend SNF for ongoing Physical Therapy.       Recommendations for follow up therapy are one component of a multi-disciplinary discharge planning process, led by the attending physician.  Recommendations may be updated based on patient status, additional functional criteria and insurance authorization.  Follow Up Recommendations  Skilled nursing-short term rehab (<3 hours/day)     Assistance Recommended at Discharge Frequent or constant Supervision/Assistance  Equipment Recommendations  Rolling [name] Small (2 wheels)    Recommendations for Other Services       Precautions / Restrictions Precautions Precautions: Fall;Back Precaution Booklet Issued: No Precaution Comments: followed back precautions for comfort Restrictions Weight Bearing Restrictions: No     Mobility  Bed Mobility Overal bed mobility: Needs Assistance Bed Mobility: Supine to Sit;Sit to Supine     Supine to sit: Supervision Sit to supine: Supervision         Transfers Overall transfer level: Needs assistance Equipment used: Rolling [name] Small (2 wheels) Transfers: Sit to/from Stand Sit to Stand: Min guard           General transfer comment: min guard for safety. Constant cues for hand placement. Repeated sit to stand x 5 with focus on eccentric control    Ambulation/Gait Ambulation/Gait assistance: Min guard Gait Distance (Feet): 150 Feet Assistive device: Rolling [name] Small (2 wheels) Gait Pattern/deviations: Step-through pattern;Decreased stride length;Narrow base of support;Trunk flexed Gait velocity: decreased   General Gait Details: cues for forward gaze and upright posture. min guard for safety due to general unsteadiness   Stairs             Wheelchair Mobility    Modified Rankin (Stroke Patients Only)       Balance Overall balance assessment: Needs assistance;History of Falls Sitting-balance support: No upper extremity supported;Feet supported Sitting balance-[name] Small: Fair     Standing balance support: Bilateral upper extremity supported;During functional activity;Reliant on assistive device for balance Standing balance-[name] Small: Poor Standing balance comment: reliant on UE support                            Cognition Arousal/Alertness: Awake/alert Behavior During Therapy: WFL for tasks assessed/performed Overall Cognitive Status: Impaired/Different from baseline Area of Impairment: Memory;Awareness;Safety/judgement                     Memory: Decreased recall of precautions;Decreased short-term memory   Safety/Judgement: Decreased awareness of safety Awareness: Emergent   General Comments: recall of back injury        Exercises Other Exercises Other Exercises: sit to stand x 5 with cues for hand  placement and focus on eccentric control    General Comments        Pertinent Vitals/Pain Pain Assessment: No/denies pain    Home Living                           Prior Function            PT Goals (current goals can now be found in the care plan section) Acute Rehab PT Goals Patient Stated Goal: to get better PT Goal Formulation: With patient/family Time For Goal Achievement: 04/01/21 Potential to Achieve Goals: Fair Progress towards PT goals: Progressing toward goals    Frequency    Min 5X/week      PT Plan Current plan remains appropriate    Co-evaluation              AM-PAC PT "6 Clicks" Mobility   Outcome Measure  Help needed turning from your back to your side while in a flat bed without using bedrails?: A Little Help needed moving from lying on your back to sitting on the side of a flat bed without using bedrails?: A Little Help needed moving to and from a bed to a chair (including a wheelchair)?: A Little Help needed standing up from a chair using your arms (e.g., wheelchair or bedside chair)?: A Little Help needed to walk in hospital room?: A Little Help needed climbing 3-5 steps with a railing? : A Lot 6 Click Score: 17    End of Session Equipment Utilized During Treatment: Gait belt Activity Tolerance: Patient tolerated treatment well Patient left: in bed;with call bell/phone within reach;with bed alarm set Nurse Communication: Mobility status PT Visit Diagnosis: Unsteadiness on feet (R26.81);Muscle weakness (generalized) (M62.81);History of falling (Z91.81)     Time: 1202-1216 PT Time Calculation (min) (ACUTE ONLY): 14 min  Charges:  $Gait Training: 8-22 mins                     Othell Jaime A. Gilford Rile PT, DPT Acute Rehabilitation Services Pager [phone] Office [phone]    Linna Hoff 03/23/2021, 1:43 PM

## 2021-03-23 NOTE — TOC Progression Note (Signed)
Transition of Care [facility]) - Progression Note    Patient Details  Name: [name] Small MRN: [id number] Date of Birth: 1929/03/29  Transition of Care [facility]) CM/SW [place], [place] Phone Number: 03/23/2021, 8:54 AM  Clinical Narrative:     Received insurance authorization [id number] reference # [id number] 11/1-011/3  Thurmond Butts, MSW, LCSW Clinical Social Worker    Expected Discharge Plan: Skilled Nursing Facility Barriers to Discharge: SNF Pending bed offer  Expected Discharge Plan and Services Expected Discharge Plan: [place] Choice: [place]   Expected Discharge Date: 03/22/21                                     Social Determinants of Health (SDOH) Interventions    Readmission Risk Interventions No flowsheet data found.

## 2021-03-23 NOTE — Progress Notes (Signed)
Briefly seen and examined this morning. Patient was medically stable and prepared for discharge to SNF yesterday but it could not happen because of absence of insurance authorization Insurance authorization received. Patient has been set up for discharge to SNF this morning. No changes made in the interval. [facility] will not bill for today.

## 2021-03-23 NOTE — Progress Notes (Signed)
Attempted report, reached voicemail. Left callback number. Will attempt again later.

## 2021-03-23 NOTE — TOC Transition Note (Signed)
Transition of Care [facility]) - CM/SW Discharge Note   Patient Details  Name: [name] Small MRN: [id number] Date of Birth: 06/07/1928  Transition of Care [facility]) CM/SW Contact:  Vinie Sill, LCSW Phone Number: 03/23/2021, 11:10 AM   Clinical Narrative:     Patient will Discharge to: [place]  Discharge Date: 05/23/2020 Family Notified:son, Elenore Rota  Transport IR:[facility]  Per MD patient is ready for discharge. RN, patient, and facility notified of discharge. Discharge Summary sent to facility. RN given number for [phone], Room 208-P. Ambulance transport requested for patient.   Clinical Social Worker signing off.  Thurmond Butts, MSW, LCSW Clinical Social Worker     Final next level of care: Skilled Nursing Facility Barriers to Discharge: Barriers Resolved   Patient Goals and CMS Choice Patient states their goals for this hospitalization and ongoing recovery are:: "I want to do what I need to do." CMS Medicare.gov Compare Post Acute Care list provided to:: Patient Choice offered to / list presented to : Patient  Discharge Placement              Patient [facility] bed [facility] An Patient to be transferred to facility by: [place] Name of family member notified: son Patient and family notified of of transfer: 03/23/21  Discharge Plan and Services     Post Acute Care Choice: [place]                               Social Determinants of Health (SDOH) Interventions     Readmission Risk Interventions No flowsheet data found.

## 2021-03-23 NOTE — Progress Notes (Addendum)
Attempted report, X2, line was disconnected. Called back X3 and spoke with receptionist, line was transferred and disconnected again. AVS printed and placed in folder as well as printed narcotic prescription.

## 2021-03-24 LAB — SURGICAL PATHOLOGY

## 2021-03-25 DIAGNOSIS — N1831 Chronic kidney disease, stage 3a: Secondary | ICD-10-CM | POA: Diagnosis not present

## 2021-03-25 DIAGNOSIS — E785 Hyperlipidemia, unspecified: Secondary | ICD-10-CM | POA: Diagnosis not present

## 2021-03-25 DIAGNOSIS — S32020D Wedge compression fracture of second lumbar vertebra, subsequent encounter for fracture with routine healing: Secondary | ICD-10-CM | POA: Diagnosis not present

## 2021-03-25 DIAGNOSIS — M818 Other osteoporosis without current pathological fracture: Secondary | ICD-10-CM | POA: Diagnosis not present

## 2021-03-25 DIAGNOSIS — I495 Sick sinus syndrome: Secondary | ICD-10-CM | POA: Diagnosis not present

## 2021-03-25 DIAGNOSIS — K5710 Diverticulosis of small intestine without perforation or abscess without bleeding: Secondary | ICD-10-CM | POA: Diagnosis not present

## 2021-03-25 DIAGNOSIS — I10 Essential (primary) hypertension: Secondary | ICD-10-CM | POA: Diagnosis not present

## 2021-03-25 DIAGNOSIS — I4891 Unspecified atrial fibrillation: Secondary | ICD-10-CM | POA: Diagnosis not present

## 2021-03-28 DIAGNOSIS — I4891 Unspecified atrial fibrillation: Secondary | ICD-10-CM | POA: Diagnosis not present

## 2021-03-28 DIAGNOSIS — S32020D Wedge compression fracture of second lumbar vertebra, subsequent encounter for fracture with routine healing: Secondary | ICD-10-CM | POA: Diagnosis not present

## 2021-03-28 DIAGNOSIS — I10 Essential (primary) hypertension: Secondary | ICD-10-CM | POA: Diagnosis not present

## 2021-03-31 DIAGNOSIS — N1831 Chronic kidney disease, stage 3a: Secondary | ICD-10-CM | POA: Diagnosis not present

## 2021-03-31 DIAGNOSIS — M810 Age-related osteoporosis without current pathological fracture: Secondary | ICD-10-CM | POA: Diagnosis not present

## 2021-03-31 DIAGNOSIS — E7849 Other hyperlipidemia: Secondary | ICD-10-CM | POA: Diagnosis not present

## 2021-03-31 DIAGNOSIS — I480 Paroxysmal atrial fibrillation: Secondary | ICD-10-CM | POA: Diagnosis not present

## 2021-03-31 DIAGNOSIS — K5710 Diverticulosis of small intestine without perforation or abscess without bleeding: Secondary | ICD-10-CM | POA: Diagnosis not present

## 2021-03-31 DIAGNOSIS — I10 Essential (primary) hypertension: Secondary | ICD-10-CM | POA: Diagnosis not present

## 2021-03-31 DIAGNOSIS — S32020D Wedge compression fracture of second lumbar vertebra, subsequent encounter for fracture with routine healing: Secondary | ICD-10-CM | POA: Diagnosis not present

## 2021-04-06 ENCOUNTER — Emergency Department (HOSPITAL_COMMUNITY): Payer: Medicare Other

## 2021-04-06 ENCOUNTER — Inpatient Hospital Stay (HOSPITAL_COMMUNITY)
Admission: EM | Admit: 2021-04-06 | Discharge: 2021-04-12 | DRG: 312 | Disposition: A | Discharge status: Skilled Nursing Facility | Payer: Medicare Other | Attending: Internal Medicine | Admitting: Internal Medicine

## 2021-04-06 ENCOUNTER — Encounter (HOSPITAL_COMMUNITY): Payer: Self-pay | Admitting: Family Medicine

## 2021-04-06 ENCOUNTER — Other Ambulatory Visit: Payer: Self-pay

## 2021-04-06 DIAGNOSIS — E785 Hyperlipidemia, unspecified: Secondary | ICD-10-CM | POA: Diagnosis not present

## 2021-04-06 DIAGNOSIS — Z7401 Bed confinement status: Secondary | ICD-10-CM | POA: Diagnosis not present

## 2021-04-06 DIAGNOSIS — Z888 Allergy status to other drugs, medicaments and biological substances status: Secondary | ICD-10-CM

## 2021-04-06 DIAGNOSIS — R0902 Hypoxemia: Secondary | ICD-10-CM | POA: Diagnosis not present

## 2021-04-06 DIAGNOSIS — I10 Essential (primary) hypertension: Secondary | ICD-10-CM

## 2021-04-06 DIAGNOSIS — M810 Age-related osteoporosis without current pathological fracture: Secondary | ICD-10-CM | POA: Diagnosis present

## 2021-04-06 DIAGNOSIS — Z882 Allergy status to sulfonamides status: Secondary | ICD-10-CM

## 2021-04-06 DIAGNOSIS — M549 Dorsalgia, unspecified: Secondary | ICD-10-CM | POA: Diagnosis not present

## 2021-04-06 DIAGNOSIS — R278 Other lack of coordination: Secondary | ICD-10-CM | POA: Diagnosis not present

## 2021-04-06 DIAGNOSIS — K5730 Diverticulosis of large intestine without perforation or abscess without bleeding: Secondary | ICD-10-CM | POA: Diagnosis present

## 2021-04-06 DIAGNOSIS — J40 Bronchitis, not specified as acute or chronic: Secondary | ICD-10-CM | POA: Diagnosis not present

## 2021-04-06 DIAGNOSIS — I129 Hypertensive chronic kidney disease with stage 1 through stage 4 chronic kidney disease, or unspecified chronic kidney disease: Secondary | ICD-10-CM | POA: Diagnosis not present

## 2021-04-06 DIAGNOSIS — N1831 Chronic kidney disease, stage 3a: Secondary | ICD-10-CM | POA: Diagnosis present

## 2021-04-06 DIAGNOSIS — R404 Transient alteration of awareness: Secondary | ICD-10-CM | POA: Diagnosis not present

## 2021-04-06 DIAGNOSIS — R9431 Abnormal electrocardiogram [ECG] [EKG]: Secondary | ICD-10-CM | POA: Diagnosis not present

## 2021-04-06 DIAGNOSIS — I480 Paroxysmal atrial fibrillation: Secondary | ICD-10-CM | POA: Diagnosis not present

## 2021-04-06 DIAGNOSIS — I495 Sick sinus syndrome: Secondary | ICD-10-CM | POA: Diagnosis present

## 2021-04-06 DIAGNOSIS — Z8249 Family history of ischemic heart disease and other diseases of the circulatory system: Secondary | ICD-10-CM | POA: Diagnosis not present

## 2021-04-06 DIAGNOSIS — Z950 Presence of cardiac pacemaker: Secondary | ICD-10-CM

## 2021-04-06 DIAGNOSIS — E876 Hypokalemia: Secondary | ICD-10-CM | POA: Diagnosis not present

## 2021-04-06 DIAGNOSIS — Z743 Need for continuous supervision: Secondary | ICD-10-CM | POA: Diagnosis not present

## 2021-04-06 DIAGNOSIS — I452 Bifascicular block: Secondary | ICD-10-CM | POA: Diagnosis present

## 2021-04-06 DIAGNOSIS — I440 Atrioventricular block, first degree: Secondary | ICD-10-CM | POA: Diagnosis present

## 2021-04-06 DIAGNOSIS — R059 Cough, unspecified: Secondary | ICD-10-CM | POA: Diagnosis not present

## 2021-04-06 DIAGNOSIS — M2550 Pain in unspecified joint: Secondary | ICD-10-CM | POA: Diagnosis not present

## 2021-04-06 DIAGNOSIS — B9620 Unspecified Escherichia coli [E. coli] as the cause of diseases classified elsewhere: Secondary | ICD-10-CM | POA: Diagnosis present

## 2021-04-06 DIAGNOSIS — R55 Syncope and collapse: Secondary | ICD-10-CM | POA: Diagnosis not present

## 2021-04-06 DIAGNOSIS — Z7901 Long term (current) use of anticoagulants: Secondary | ICD-10-CM | POA: Diagnosis not present

## 2021-04-06 DIAGNOSIS — N1832 Chronic kidney disease, stage 3b: Secondary | ICD-10-CM | POA: Diagnosis present

## 2021-04-06 DIAGNOSIS — I951 Orthostatic hypotension: Principal | ICD-10-CM | POA: Diagnosis present

## 2021-04-06 DIAGNOSIS — J9811 Atelectasis: Secondary | ICD-10-CM | POA: Diagnosis not present

## 2021-04-06 DIAGNOSIS — N179 Acute kidney failure, unspecified: Secondary | ICD-10-CM | POA: Diagnosis present

## 2021-04-06 DIAGNOSIS — S32028D Other fracture of second lumbar vertebra, subsequent encounter for fracture with routine healing: Secondary | ICD-10-CM | POA: Diagnosis not present

## 2021-04-06 DIAGNOSIS — Z79899 Other long term (current) drug therapy: Secondary | ICD-10-CM | POA: Diagnosis not present

## 2021-04-06 DIAGNOSIS — N390 Urinary tract infection, site not specified: Secondary | ICD-10-CM | POA: Diagnosis present

## 2021-04-06 DIAGNOSIS — I700 Atherosclerosis of aorta: Secondary | ICD-10-CM | POA: Diagnosis not present

## 2021-04-06 DIAGNOSIS — U071 COVID-19: Secondary | ICD-10-CM | POA: Diagnosis present

## 2021-04-06 DIAGNOSIS — I4891 Unspecified atrial fibrillation: Secondary | ICD-10-CM | POA: Diagnosis present

## 2021-04-06 DIAGNOSIS — R531 Weakness: Secondary | ICD-10-CM | POA: Diagnosis not present

## 2021-04-06 DIAGNOSIS — R2689 Other abnormalities of gait and mobility: Secondary | ICD-10-CM | POA: Diagnosis not present

## 2021-04-06 DIAGNOSIS — M6281 Muscle weakness (generalized): Secondary | ICD-10-CM | POA: Diagnosis not present

## 2021-04-06 DIAGNOSIS — S32020D Wedge compression fracture of second lumbar vertebra, subsequent encounter for fracture with routine healing: Secondary | ICD-10-CM | POA: Diagnosis not present

## 2021-04-06 LAB — LACTIC ACID, PLASMA
Lactic Acid, Venous: 1.6 mmol/L (ref 0.5–1.9)
Lactic Acid, Venous: 1.5 mmol/L (ref 0.5–1.9)

## 2021-04-06 LAB — BASIC METABOLIC PANEL
Anion gap: 11 (ref 5–15)
BUN: 15 mg/dL (ref 8–23)
CO2: 27 mmol/L (ref 22–32)
Calcium: 8.9 mg/dL (ref 8.9–10.3)
Chloride: 100 mmol/L (ref 98–111)
Creatinine, Ser: 0.94 mg/dL (ref 0.44–1.00)
GFR, Estimated: 58 mL/min — ABNORMAL LOW (ref >60)
Glucose, Bld: 136 mg/dL — ABNORMAL HIGH (ref 70–99)
Potassium: 3.6 mmol/L (ref 3.5–5.1)
Sodium: 138 mmol/L (ref 135–145)

## 2021-04-06 LAB — RESP PANEL BY RT-PCR (FLU A&B, COVID) ARPGX2
Influenza A by PCR: NEGATIVE
Influenza B by PCR: NEGATIVE
SARS Coronavirus 2 by RT PCR: POSITIVE — AB

## 2021-04-06 LAB — CBC WITH DIFFERENTIAL/PLATELET
Abs Immature Granulocytes: 0.03 10*3/uL (ref 0.00–0.07)
Basophils Absolute: 0 10*3/uL (ref 0.0–0.1)
Basophils Relative: 0 %
Eosinophils Absolute: 0 10*3/uL (ref 0.0–0.5)
Eosinophils Relative: 0 %
HCT: 40.6 % (ref 36.0–46.0)
Hemoglobin: 13 g/dL (ref 12.0–15.0)
Immature Granulocytes: 1 %
Lymphocytes Relative: 16 %
Lymphs Abs: 1 10*3/uL (ref 0.7–4.0)
MCH: 30.2 pg (ref 26.0–34.0)
MCHC: 32 g/dL (ref 30.0–36.0)
MCV: 94.2 fL (ref 80.0–100.0)
Monocytes Absolute: 0.6 10*3/uL (ref 0.1–1.0)
Monocytes Relative: 10 %
Neutro Abs: 4.4 10*3/uL (ref 1.7–7.7)
Neutrophils Relative %: 73 %
Platelets: 142 10*3/uL — ABNORMAL LOW (ref 150–400)
RBC: 4.31 MIL/uL (ref 3.87–5.11)
RDW: 13.2 % (ref 11.5–15.5)
WBC: 6 10*3/uL (ref 4.0–10.5)
nRBC: 0 % (ref 0.0–0.2)

## 2021-04-06 LAB — HEPATIC FUNCTION PANEL
ALT: 34 U/L (ref 0–44)
AST: 38 U/L (ref 15–41)
Albumin: 3 g/dL — ABNORMAL LOW (ref 3.5–5.0)
Alkaline Phosphatase: 88 U/L (ref 38–126)
Bilirubin, Direct: 0.2 mg/dL (ref 0.0–0.2)
Indirect Bilirubin: 0.7 mg/dL (ref 0.3–0.9)
Total Bilirubin: 0.9 mg/dL (ref 0.3–1.2)
Total Protein: 6.1 g/dL — ABNORMAL LOW (ref 6.5–8.1)

## 2021-04-06 LAB — C-REACTIVE PROTEIN: CRP: 3.4 mg/dL — ABNORMAL HIGH (ref <1.0)

## 2021-04-06 LAB — D-DIMER, QUANTITATIVE: D-Dimer, Quant: 0.71 ug/mL-FEU — ABNORMAL HIGH (ref 0.00–0.50)

## 2021-04-06 LAB — FERRITIN: Ferritin: 418 ng/mL — ABNORMAL HIGH (ref 11–307)

## 2021-04-06 LAB — CBG MONITORING, ED: Glucose-Capillary: 131 mg/dL — ABNORMAL HIGH (ref 70–99)

## 2021-04-06 MED ORDER — SODIUM CHLORIDE 0.9 % IV SOLN
100.0000 mg | Freq: Every day | INTRAVENOUS | Status: AC
  Administered 2021-04-07 – 2021-04-08 (×2): 100 mg via INTRAVENOUS
  Filled 2021-04-06 (×2): qty 20

## 2021-04-06 MED ORDER — LOSARTAN POTASSIUM 50 MG PO TABS
50.0000 mg | ORAL_TABLET | Freq: Every day | ORAL | Status: DC
  Administered 2021-04-07 – 2021-04-10 (×4): 50 mg via ORAL
  Filled 2021-04-06 (×4): qty 1

## 2021-04-06 MED ORDER — ACETAMINOPHEN 325 MG PO TABS: 650.0000 mg | ORAL_TABLET | Freq: Four times a day (QID) | ORAL | Status: DC | PRN

## 2021-04-06 MED ORDER — OXYCODONE HCL 5 MG PO TABS
5.0000 mg | ORAL_TABLET | ORAL | Status: DC | PRN
  Administered 2021-04-07: 06:00:00 5 mg via ORAL
  Filled 2021-04-06: qty 1

## 2021-04-06 MED ORDER — SODIUM CHLORIDE 0.9% FLUSH
3.0000 mL | Freq: Two times a day (BID) | INTRAVENOUS | Status: DC
  Administered 2021-04-06 – 2021-04-11 (×6): 3 mL via INTRAVENOUS

## 2021-04-06 MED ORDER — ONDANSETRON HCL 4 MG PO TABS: 4.0000 mg | ORAL_TABLET | Freq: Four times a day (QID) | ORAL | Status: DC | PRN

## 2021-04-06 MED ORDER — ONDANSETRON HCL 4 MG/2ML IJ SOLN
4.0000 mg | Freq: Four times a day (QID) | INTRAMUSCULAR | Status: DC | PRN
  Administered 2021-04-07 – 2021-04-10 (×9): 4 mg via INTRAVENOUS
  Filled 2021-04-06 (×10): qty 2

## 2021-04-06 MED ORDER — METOPROLOL TARTRATE 25 MG PO TABS
25.0000 mg | ORAL_TABLET | Freq: Two times a day (BID) | ORAL | Status: DC
  Administered 2021-04-06 – 2021-04-11 (×12): 25 mg via ORAL
  Filled 2021-04-06 (×11): qty 1

## 2021-04-06 MED ORDER — DOCUSATE SODIUM 100 MG PO CAPS
100.0000 mg | ORAL_CAPSULE | Freq: Two times a day (BID) | ORAL | Status: DC
  Administered 2021-04-07 – 2021-04-11 (×7): 100 mg via ORAL
  Filled 2021-04-06 (×10): qty 1

## 2021-04-06 MED ORDER — RIVAROXABAN 15 MG PO TABS
15.0000 mg | ORAL_TABLET | Freq: Every day | ORAL | Status: DC
  Administered 2021-04-07 – 2021-04-11 (×5): 15 mg via ORAL
  Filled 2021-04-06 (×5): qty 1

## 2021-04-06 MED ORDER — SODIUM CHLORIDE 0.9 % IV SOLN
200.0000 mg | Freq: Once | INTRAVENOUS | Status: AC
  Administered 2021-04-06: 200 mg via INTRAVENOUS
  Filled 2021-04-06: qty 40

## 2021-04-06 MED ORDER — GUAIFENESIN-DM 100-10 MG/5ML PO SYRP
10.0000 mL | ORAL_SOLUTION | ORAL | Status: DC | PRN
  Administered 2021-04-08 – 2021-04-09 (×3): 10 mL via ORAL
  Filled 2021-04-06 (×3): qty 10

## 2021-04-06 MED ORDER — SODIUM CHLORIDE 0.9 % IV SOLN: INTRAVENOUS | Status: AC

## 2021-04-06 MED ORDER — ACETAMINOPHEN 650 MG RE SUPP: 650.0000 mg | Freq: Four times a day (QID) | RECTAL | Status: DC | PRN

## 2021-04-06 MED ORDER — SODIUM CHLORIDE 0.9 % IV BOLUS
1000.0000 mL | Freq: Once | INTRAVENOUS | Status: AC
  Administered 2021-04-06: 1000 mL via INTRAVENOUS

## 2021-04-06 MED ORDER — AMIODARONE HCL 200 MG PO TABS
200.0000 mg | ORAL_TABLET | Freq: Every day | ORAL | Status: DC
  Administered 2021-04-07 – 2021-04-11 (×5): 200 mg via ORAL
  Filled 2021-04-06 (×5): qty 1

## 2021-04-06 MED ORDER — POLYVINYL ALCOHOL 1.4 % OP SOLN
1.0000 [drp] | Freq: Every day | OPHTHALMIC | Status: DC
  Administered 2021-04-07 – 2021-04-08 (×2): 1 [drp] via OPHTHALMIC
  Filled 2021-04-06: qty 15

## 2021-04-06 MED ORDER — ATORVASTATIN CALCIUM 40 MG PO TABS
40.0000 mg | ORAL_TABLET | Freq: Every day | ORAL | Status: DC
  Administered 2021-04-07 – 2021-04-11 (×5): 40 mg via ORAL
  Filled 2021-04-06 (×5): qty 1

## 2021-04-06 NOTE — H&P (Signed)
History and Physical    [name]:[id number] DOB: December 28, 1928 DOA: 04/06/2021  PCP: Billie Ruddy, MD   Patient coming from: SNF   Chief Complaint: Syncope, covid   HPI: [name] Small is a pleasant 87 y.o. female with medical history significant for atrial fibrillation on Xarelto, sick sinus syndrome with pacer, mild renal insufficiency, hypertension, and vertebral compression fracture status post vertebroplasty on 03/21/2021, now presenting to the emergency department for evaluation of syncope.  Patient was admitted to the hospital last month with severe pain related to L2 fracture that was managed with analgesics and vertebroplasty before she was discharged to an SNF on 03/23/2021.  She had been doing well at the facility until 04/03/2021 when she developed loss of appetite, malaise and fatigue, and cough.  She tested positive for COVID-19.  She had a syncopal episode yesterday and she does not remember and then another episode that was witnessed by staff today while she was using the restroom.  Patient denies any recent chest pain or shortness of breath.  She has had some nausea with a couple episodes of nonbloody vomiting but no abdominal pain.    ED Course: Upon arrival to the ED, patient is found to be afebrile, saturating well on room air, and with stable blood pressure.  EKG features sinus rhythm with first-degree AV nodal block, incomplete RBBB, and LAFB.  Chest x-ray negative for acute cardiopulmonary disease.  No acute findings on CT head or cervical spine.  Chemistry panel unremarkable.  Slight thrombocytopenia noted on CBC.  COVID-19 PCR is positive with cycle threshold 22.3.  Patient was given a liter of saline in the ED.  Review of Systems:  All other systems reviewed and apart from HPI, are negative.  Past Medical History:  Diagnosis Date   Atrial fibrillation ([place])    Bleeding stomach ulcer 1980s?   Cancer ([place]) Meloanoma left leg   DIVERTICULOSIS, COLON 11/22/2006    Headache(784.0)    "very often; not regular" (11/25/2015)   History of blood transfusion 1980s   "when I had the bleeding ulcer"   HYPERLIPIDEMIA 11/22/2006   Hypertension    Menopausal syndrome (hot flashes)    OSTEOPOROSIS 11/22/2006   Presence of permanent cardiac pacemaker     Past Surgical History:  Procedure Laterality Date   APPENDECTOMY     BUNIONECTOMY Right    CATARACT EXTRACTION W/ INTRAOCULAR LENS  IMPLANT, BILATERAL Bilateral 2000s   CHOLECYSTECTOMY OPEN     EP IMPLANTABLE DEVICE N/A 11/25/2015   Procedure: Pacemaker Implant;  Surgeon: Will Meredith Leeds, MD;  Location: [facility];  Service: Cardiovascular;  Laterality: N/A;   FEMUR IM NAIL Left 07/08/2014   Procedure: INTRAMEDULLARY (IM) NAIL ;  Surgeon: Meredith Pel, MD;  Location: [facility];  Service: Orthopedics;  Laterality: Left;   FRACTURE SURGERY     IR KYPHO LUMBAR INC FX REDUCE BONE BX UNI/BIL CANNULATION INC/IMAGING  03/21/2021   REVISION TOTAL HIP ARTHROPLASTY Left 06/2014    Social History:   reports that she has never smoked. She has never used smokeless tobacco. She reports that she does not drink alcohol and does not use drugs.  Allergies  Allergen Reactions   Septra Ds [Sulfamethoxazole-Trimethoprim]    Lisinopril Other (See Comments)    Cough    Family History  Problem Relation Age of Onset   Alzheimer's disease Sister    Cardiomyopathy Brother      Prior to Admission medications   Medication Sig Start  Date End Date Taking? Authorizing Provider  acetaminophen (TYLENOL) 500 MG tablet Take 1 tablet (500 mg total) by mouth every 6 (six) hours as needed. 03/07/21  Yes Varney Biles, MD  amiodarone (PACERONE) 200 MG tablet TAKE 1 TABLET(200 MG) BY MOUTH DAILY Patient taking differently: Take 200 mg by mouth daily with lunch. 02/07/21  Yes Josue Hector, MD  atorvastatin (LIPITOR) 40 MG tablet TAKE 1 TABLET(40 MG) BY MOUTH DAILY Patient taking differently: Take 40 mg by mouth daily with  lunch. 02/11/21  Yes Billie Ruddy, MD  docusate sodium (COLACE) 100 MG capsule Take 1 capsule (100 mg total) by mouth 2 (two) times daily. 03/22/21  Yes Barb Merino, MD  losartan (COZAAR) 50 MG tablet Take 1 tablet (50 mg total) by mouth daily. 08/08/16  Yes Josue Hector, MD  metoprolol tartrate (LOPRESSOR) 25 MG tablet Take 1 tablet (25 mg total) by mouth 2 (two) times daily. 01/06/19  Yes Camnitz, Ocie Doyne, MD  oxyCODONE (OXY IR/ROXICODONE) 5 MG immediate release tablet Take 1 tablet (5 mg total) by mouth every 4 (four) hours as needed for severe pain. 03/22/21  Yes Barb Merino, MD  Polyvinyl Alcohol-Povidone (REFRESH OP) Place 1 drop into both eyes daily.   Yes [provider]  XARELTO 15 MG TABS tablet TAKE 1 TABLET(15 MG) BY MOUTH DAILY WITH DINNER Patient taking differently: Take 15 mg by mouth daily with lunch. 09/27/20  Yes Josue Hector, MD  calcitonin, salmon, (MIACALCIN) 200 UNIT/ACT nasal spray Place 1 spray into alternate nostrils daily. Patient not taking: Reported on 04/06/2021 03/11/21   Billie Ruddy, MD    Physical Exam: Vitals:   04/06/21 1900 04/06/21 1915 04/06/21 1945 04/06/21 2000  BP: (!) 173/67 (!) 172/63 (!) 171/67 (!) 164/66  Pulse: (!) 59 (!) 59 (!) 59 (!) 59  Resp: 17 17 (!) 21 20  Temp:      TempSrc:      SpO2: 98% 98% 98% 98%    Constitutional: NAD, calm  Eyes: PERTLA, lids and conjunctivae normal ENMT: Mucous membranes are moist. Posterior pharynx clear of any exudate or lesions.   Neck: supple, no masses  Respiratory:  no wheezing, no crackles. No accessory muscle use.  Cardiovascular: S1 & S2 heard, regular rate and rhythm. No extremity edema.   Abdomen: No distension, no tenderness, soft. Bowel sounds active.  Musculoskeletal: no clubbing / cyanosis. No joint deformity upper and lower extremities.   Skin: no significant rashes, lesions, ulcers. Warm, dry, well-perfused. Neurologic: CN 2-12 grossly intact. Moving all  extremities. Alert and oriented.  Psychiatric: Pleasant. Cooperative.    Labs and Imaging on Admission: I have personally reviewed following labs and imaging studies  CBC: Recent Labs  Lab 04/06/21 1840  WBC 6.0  NEUTROABS 4.4  HGB 13.0  HCT 40.6  MCV 94.2  PLT 099*   Basic Metabolic Panel: Recent Labs  Lab 04/06/21 1514  NA 138  K 3.6  CL 100  CO2 27  GLUCOSE 136*  BUN 15  CREATININE 0.94  CALCIUM 8.9   GFR: CrCl cannot be calculated (Unknown ideal weight.). Liver Function Tests: No results for input(s): AST, ALT, ALKPHOS, BILITOT, PROT, ALBUMIN in the last 168 hours. No results for input(s): LIPASE, AMYLASE in the last 168 hours. No results for input(s): AMMONIA in the last 168 hours. Coagulation Profile: No results for input(s): INR, PROTIME in the last 168 hours. Cardiac Enzymes: No results for input(s): CKTOTAL, CKMB, CKMBINDEX, TROPONINI in the last  168 hours. BNP (last 3 results) No results for input(s): PROBNP in the last 8760 hours. HbA1C: No results for input(s): HGBA1C in the last 72 hours. CBG: Recent Labs  Lab 04/06/21 1616  GLUCAP 131*   Lipid Profile: No results for input(s): CHOL, HDL, LDLCALC, TRIG, CHOLHDL, LDLDIRECT in the last 72 hours. Thyroid Function Tests: No results for input(s): TSH, T4TOTAL, FREET4, T3FREE, THYROIDAB in the last 72 hours. Anemia Panel: No results for input(s): VITAMINB12, FOLATE, FERRITIN, TIBC, IRON, RETICCTPCT in the last 72 hours. Urine analysis:    Component Value Date/Time   COLORURINE YELLOW 11/24/2015 0910   APPEARANCEUR CLOUDY (A) 11/24/2015 0910   LABSPEC 1.017 11/24/2015 0910   PHURINE 6.5 11/24/2015 0910   GLUCOSEU NEGATIVE 11/24/2015 0910   HGBUR NEGATIVE 11/24/2015 0910   HGBUR negative 06/16/2008 0000   BILIRUBINUR negative 02/02/2020 1029   KETONESUR NEGATIVE 11/24/2015 0910   PROTEINUR Negative 02/02/2020 1029   PROTEINUR NEGATIVE 11/24/2015 0910   UROBILINOGEN 0.2 02/02/2020 1029    UROBILINOGEN 0.2 01/27/2015 1052   NITRITE negative 02/02/2020 1029   NITRITE NEGATIVE 11/24/2015 0910   LEUKOCYTESUR Small (1+) (A) 02/02/2020 1029   Sepsis Labs: @LABRCNTIP (procalcitonin:4,lacticidven:4) ) Recent Results (from the past 240 hour(s))  Resp Panel by RT-PCR (Flu A&B, Covid) Nasopharyngeal Swab     Status: Abnormal   Collection Time: 04/06/21  3:14 PM   Specimen: Nasopharyngeal Swab; Nasopharyngeal(NP) swabs in vial transport medium  Result Value Ref Range Status   SARS Coronavirus 2 by RT PCR POSITIVE (A) NEGATIVE Final    Comment: RESULT CALLED TO, READ BACK BY AND VERIFIED WITH: CHRIS CRISCOE 04/06/2021 @2044  BY JW (NOTE) SARS-CoV-2 target nucleic acids are DETECTED.  The SARS-CoV-2 RNA is generally detectable in upper respiratory specimens during the acute phase of infection. Positive results are indicative of the presence of the identified virus, but do not rule out bacterial infection or co-infection with other pathogens not detected by the test. Clinical correlation with patient history and other diagnostic information is necessary to determine patient infection status. The expected result is Negative.  Fact Sheet for Patients: EntrepreneurPulse.com.au  Fact Sheet for Healthcare Providers: IncredibleEmployment.be  This test is not yet approved or cleared by the [place] FDA and  has been authorized for detection and/or diagnosis of SARS-CoV-2 by FDA under an Emergency Use Authorization (EUA).  This EUA will remain in effect (meaning this test can  be used) for the duration of  the COVID-19 declaration under Section 564(b)(1) of the Act, 21 U.S.C. section 360bbb-3(b)(1), unless the authorization is terminated or revoked sooner.     Influenza A by PCR NEGATIVE NEGATIVE Final   Influenza B by PCR NEGATIVE NEGATIVE Final    Comment: (NOTE) The Xpert Xpress SARS-CoV-2/FLU/RSV plus assay is intended as an aid in the  diagnosis of influenza from Nasopharyngeal swab specimens and should not be used as a sole basis for treatment. Nasal washings and aspirates are unacceptable for Xpert Xpress SARS-CoV-2/FLU/RSV testing.  Fact Sheet for Patients: EntrepreneurPulse.com.au  Fact Sheet for Healthcare Providers: IncredibleEmployment.be  This test is not yet approved or cleared by the [place] FDA and has been authorized for detection and/or diagnosis of SARS-CoV-2 by FDA under an Emergency Use Authorization (EUA). This EUA will remain in effect (meaning this test can be used) for the duration of the COVID-19 declaration under Section 564(b)(1) of the Act, 21 U.S.C. section 360bbb-3(b)(1), unless the authorization is terminated or revoked.  Performed at [facility], [place]  [address] [place], [place] [address]      Radiological Exams on Admission: CT Head Wo Contrast  Result Date: 04/06/2021 CLINICAL DATA:  Syncope. EXAM: CT HEAD WITHOUT CONTRAST TECHNIQUE: Contiguous axial images were obtained from the base of the skull through the vertex without intravenous contrast. COMPARISON:  03/07/2021 FINDINGS: Brain: There is atrophy and chronic small vessel disease changes. No acute intracranial abnormality. Specifically, no hemorrhage, hydrocephalus, mass lesion, acute infarction, or significant intracranial injury. Vascular: No hyperdense vessel or unexpected calcification. Skull: No acute calvarial abnormality. Sinuses/Orbits: No acute findings Other: None IMPRESSION: Atrophy, chronic microvascular disease. No acute intracranial abnormality. Electronically Signed   By: Rolm Baptise M.D.   On: 04/06/2021 17:17   CT Cervical Spine Wo Contrast  Result Date: 04/06/2021 CLINICAL DATA:  Syncope EXAM: CT CERVICAL SPINE WITHOUT CONTRAST TECHNIQUE: Multidetector CT imaging of the cervical spine was performed without intravenous contrast. Multiplanar CT image reconstructions  were also generated. COMPARISON:  None. FINDINGS: Alignment: Normal Skull base and vertebrae: No acute fracture. No primary bone lesion or focal pathologic process. Soft tissues and spinal canal: No prevertebral fluid or swelling. No visible canal hematoma. Disc levels:  Maintained Upper chest: Biapical scarring. Other: None IMPRESSION: No acute bony abnormality. Electronically Signed   By: Rolm Baptise M.D.   On: 04/06/2021 17:18   DG Chest Port 1 View  Result Date: 04/06/2021 CLINICAL DATA:  Cough, COVID EXAM: PORTABLE CHEST 1 VIEW COMPARISON:  11/26/2015 FINDINGS: Left-sided implanted cardiac device with stable positioning of leads. Heart size within normal limits. Atherosclerotic calcification of the aortic knob. No focal airspace consolidation, pleural effusion, or pneumothorax. No acute bony findings. IMPRESSION: No acute cardiopulmonary findings. Electronically Signed   By: Davina Poke D.O.   On: 04/06/2021 15:42    EKG: Independently reviewed. Sinus rhythm, 1st deg AV block, incomplete RBBB and LAFB.   Assessment/Plan   1. Syncope  - Pt with hx of sick sinus syndrome with pacer and 3 days of COVID presents after a second syncopal episode in 2 days  - Interrogate device, continue cardiac monitoring, check orthostatic vitals, check echocardiogram, continue gentle IVF hydration    2. COVID  - Pt developed malaise, loss of appetite, and cough on 11/13 and tested positive for COVID at her SNF  - PCR is positive in ED with CT 22.3  - CXR is clear and sxs are mild; she is hospitalized for syncope not covid  - Paxlovid contraindicated in setting of Xarelto use, discussed potential risks and benefits of remdesivir with patient and her son and they would like to try it  - Start remdesivir, continue supportive care    3. Atrial fibrillation   - Continue Xarelto and metoprolol    4. CKD IIIa  - SCr is 0.94 on admission which appears to be her baseline    5. Hypertension  - Continue  losartan and metoprolol     DVT prophylaxis: Xarelto  Code Status: Full, confirmed on admission  Level of Care: Level of care: Telemetry Cardiac Family Communication: Son updated by phone  Disposition Plan:  Patient is from: SNF  Anticipated d/c is to: SNF  Anticipated d/c date is: 11/17 or 04/08/21 Patient currently: Pending cardiac monitoring, echocardiogram  Consults called: None Admission status: Observation     Vianne Bulls, MD [facility]  04/06/2021, 9:12 PM

## 2021-04-06 NOTE — ED Triage Notes (Signed)
Pt here via EMS from [facility] d/t having a syncopal episode. Pt was using restroom ,staff stand by assist. Had syncopal episode, caught by staff and lowered to ground. Pt alert and oriented X4. Recent Covid Dx.  124/76 P 66 RR 16 97.3 T 100% RA 176 CBG

## 2021-04-06 NOTE — ED Notes (Signed)
Order for interrogation of a pacemaker  the pt does not know if she has a pacemaker or what brand it is

## 2021-04-06 NOTE — ED Provider Notes (Signed)
[facility] EMERGENCY DEPARTMENT Provider Note   CSN: [id number] Arrival date & time: 04/06/21  1502     History No chief complaint on file.   [name] Small is a 87 y.o. female.  The history is provided by the patient. No language interpreter was used.   87 year old female significant history of atrial fibrillation on Xarelto, hypertension, has cardiac pacemaker, brought here via EMS from [place] facility for evaluation of syncope.  Per triage note, patient recently tested positive for COVID-19 and had a syncopal episode.  No additional information was provided.  Patient states she has had COVID symptoms ongoing for the past week.  Symptoms including persistent cough, decrease in appetite and some generalized weakness but she denies having fever chest pain shortness of breath abdominal pain nausea with vomiting or diarrhea or dysuria.  She did not recall any syncopal episode.  She denies any head injury.  She does endorse lower back pain from prior lumbar spine fracture but no worsening pain.  No report of any focal numbness or focal weakness.  Past Medical History:  Diagnosis Date   Atrial fibrillation ([place])    Bleeding stomach ulcer 1980s?   Cancer ([place]) Meloanoma left leg   DIVERTICULOSIS, COLON 11/22/2006   Headache(784.0)    "very often; not regular" (11/25/2015)   History of blood transfusion 1980s   "when I had the bleeding ulcer"   HYPERLIPIDEMIA 11/22/2006   Hypertension    Menopausal syndrome (hot flashes)    OSTEOPOROSIS 11/22/2006   Presence of permanent cardiac pacemaker     Patient Active Problem List   Diagnosis Date Noted   Compression fracture of L2 ([place]) 03/17/2021   Lumbar compression fracture ([place]) 03/17/2021   Hypertension    Low back pain 03/16/2021   Hypertensive urgency 03/16/2021   PAF (paroxysmal atrial fibrillation) ([place]) 03/16/2021   Compression fracture of L2 lumbar vertebra ([facility])    Porokeratosis 05/02/2019   Hav (hallux  abducto valgus), unspecified laterality 05/02/2019   Hyperglycemia 12/07/2014   Atrial fibrillation with RVR-CHADs VASc=4 12/07/2014   Intertrochanteric fracture of left hip ([place]) 07/08/2014   Overactive bladder 04/02/2012   Essential hypertension 04/02/2012   Chest pain, atypical 09/17/2011   Dyslipidemia 11/22/2006   DIVERTICULOSIS, COLON 11/22/2006   Osteoporosis 11/22/2006    Past Surgical History:  Procedure Laterality Date   APPENDECTOMY     BUNIONECTOMY Right    CATARACT EXTRACTION W/ INTRAOCULAR LENS  IMPLANT, BILATERAL Bilateral 2000s   CHOLECYSTECTOMY OPEN     EP IMPLANTABLE DEVICE N/A 11/25/2015   Procedure: Pacemaker Implant;  Surgeon: Will Meredith Leeds, MD;  Location: [facility];  Service: Cardiovascular;  Laterality: N/A;   FEMUR IM NAIL Left 07/08/2014   Procedure: INTRAMEDULLARY (IM) NAIL ;  Surgeon: Meredith Pel, MD;  Location: [facility];  Service: Orthopedics;  Laterality: Left;   FRACTURE SURGERY     IR KYPHO LUMBAR INC FX REDUCE BONE BX UNI/BIL CANNULATION INC/IMAGING  03/21/2021   REVISION TOTAL HIP ARTHROPLASTY Left 06/2014     OB History   No obstetric history on file.     Family History  Problem Relation Age of Onset   Alzheimer's disease Sister    Cardiomyopathy Brother     Social History   Tobacco Use   Smoking status: Never   Smokeless tobacco: Never  Substance Use Topics   Alcohol use: No    Alcohol/week: 0.0 standard drinks   Drug use: No    Home  Medications Prior to Admission medications   Medication Sig Start Date End Date Taking? Authorizing Provider  acetaminophen (TYLENOL) 500 MG tablet Take 1 tablet (500 mg total) by mouth every 6 (six) hours as needed. 03/07/21   Varney Biles, MD  amiodarone (PACERONE) 200 MG tablet TAKE 1 TABLET(200 MG) BY MOUTH DAILY Patient taking differently: Take 200 mg by mouth daily with lunch. 02/07/21   Josue Hector, MD  atorvastatin (LIPITOR) 40 MG tablet TAKE 1 TABLET(40 MG) BY MOUTH  DAILY Patient taking differently: Take 40 mg by mouth daily with lunch. 02/11/21   Billie Ruddy, MD  calcitonin, salmon, (MIACALCIN) 200 UNIT/ACT nasal spray Place 1 spray into alternate nostrils daily. 03/11/21   Billie Ruddy, MD  docusate sodium (COLACE) 100 MG capsule Take 1 capsule (100 mg total) by mouth 2 (two) times daily. 03/22/21   Barb Merino, MD  losartan (COZAAR) 50 MG tablet Take 1 tablet (50 mg total) by mouth daily. Patient not taking: Reported on 03/17/2021 08/08/16   Josue Hector, MD  metoprolol tartrate (LOPRESSOR) 25 MG tablet Take 1 tablet (25 mg total) by mouth 2 (two) times daily. Patient not taking: Reported on 03/17/2021 01/06/19   Constance Haw, MD  oxyCODONE (OXY IR/ROXICODONE) 5 MG immediate release tablet Take 1 tablet (5 mg total) by mouth every 4 (four) hours as needed for severe pain. 03/22/21   Barb Merino, MD  Polyvinyl Alcohol-Povidone (REFRESH OP) Place 1 drop into both eyes daily.    [provider]  XARELTO 15 MG TABS tablet TAKE 1 TABLET(15 MG) BY MOUTH DAILY WITH DINNER Patient taking differently: Take 15 mg by mouth daily with lunch. 09/27/20   Josue Hector, MD    Allergies    Septra ds [sulfamethoxazole-trimethoprim] and Lisinopril  Review of Systems   Review of Systems  All other systems reviewed and are negative.  Physical Exam Updated Vital Signs There were no vitals taken for this visit.  Physical Exam Vitals and nursing note reviewed.  Constitutional:      General: She is not in acute distress.    Appearance: She is well-developed.  HENT:     Head: Atraumatic.     Mouth/Throat:     Mouth: Mucous membranes are dry.  Eyes:     Conjunctiva/sclera: Conjunctivae normal.  Cardiovascular:     Rate and Rhythm: Normal rate and regular rhythm.     Pulses: Normal pulses.     Heart sounds: Normal heart sounds.  Pulmonary:     Effort: Pulmonary effort is normal.     Breath sounds: Rhonchi and rales present.   Abdominal:     Palpations: Abdomen is soft.     Tenderness: There is no abdominal tenderness.  Musculoskeletal:     Cervical back: Neck supple.     Right lower leg: No edema.     Left lower leg: No edema.     Comments: Equal strength to all 4 extremities.  Skin:    Findings: No rash.  Neurological:     Mental Status: She is alert. Mental status is at baseline.  Psychiatric:        Mood and Affect: Mood normal.    ED Results / Procedures / Treatments   Labs (all labs ordered are listed, but only abnormal results are displayed) Labs Reviewed  BASIC METABOLIC PANEL - Abnormal; Notable for the following components:      Result Value   Glucose, Bld 136 (*)    GFR,  Estimated 58 (*)    All other components within normal limits  CBC WITH DIFFERENTIAL/PLATELET - Abnormal; Notable for the following components:   Platelets 142 (*)    All other components within normal limits  CBG MONITORING, ED - Abnormal; Notable for the following components:   Glucose-Capillary 131 (*)    All other components within normal limits  RESP PANEL BY RT-PCR (FLU A&B, COVID) ARPGX2  LACTIC ACID, PLASMA  LACTIC ACID, PLASMA  CBC WITH DIFFERENTIAL/PLATELET  URINALYSIS, ROUTINE W REFLEX MICROSCOPIC    EKG EKG Interpretation  Date/Time:  Wednesday April 06 2021 15:06:46 EST Ventricular Rate:  67 PR Interval:  202 QRS Duration: 106 QT Interval:  449 QTC Calculation: 474 R Axis:   -45 Text Interpretation: Sinus rhythm Incomplete RBBB and LAFB Consider right ventricular hypertrophy No significant change since last tracing Confirmed by Deno Etienne [phone]) on 04/06/2021 4:16:51 PM  Radiology CT Head Wo Contrast  Result Date: 04/06/2021 CLINICAL DATA:  Syncope. EXAM: CT HEAD WITHOUT CONTRAST TECHNIQUE: Contiguous axial images were obtained from the base of the skull through the vertex without intravenous contrast. COMPARISON:  03/07/2021 FINDINGS: Brain: There is atrophy and chronic small vessel  disease changes. No acute intracranial abnormality. Specifically, no hemorrhage, hydrocephalus, mass lesion, acute infarction, or significant intracranial injury. Vascular: No hyperdense vessel or unexpected calcification. Skull: No acute calvarial abnormality. Sinuses/Orbits: No acute findings Other: None IMPRESSION: Atrophy, chronic microvascular disease. No acute intracranial abnormality. Electronically Signed   By: Rolm Baptise M.D.   On: 04/06/2021 17:17   CT Cervical Spine Wo Contrast  Result Date: 04/06/2021 CLINICAL DATA:  Syncope EXAM: CT CERVICAL SPINE WITHOUT CONTRAST TECHNIQUE: Multidetector CT imaging of the cervical spine was performed without intravenous contrast. Multiplanar CT image reconstructions were also generated. COMPARISON:  None. FINDINGS: Alignment: Normal Skull base and vertebrae: No acute fracture. No primary bone lesion or focal pathologic process. Soft tissues and spinal canal: No prevertebral fluid or swelling. No visible canal hematoma. Disc levels:  Maintained Upper chest: Biapical scarring. Other: None IMPRESSION: No acute bony abnormality. Electronically Signed   By: Rolm Baptise M.D.   On: 04/06/2021 17:18   DG Chest Port 1 View  Result Date: 04/06/2021 CLINICAL DATA:  Cough, COVID EXAM: PORTABLE CHEST 1 VIEW COMPARISON:  11/26/2015 FINDINGS: Left-sided implanted cardiac device with stable positioning of leads. Heart size within normal limits. Atherosclerotic calcification of the aortic knob. No focal airspace consolidation, pleural effusion, or pneumothorax. No acute bony findings. IMPRESSION: No acute cardiopulmonary findings. Electronically Signed   By: Davina Poke D.O.   On: 04/06/2021 15:42    Procedures Procedures   Medications Ordered in ED Medications  sodium chloride 0.9 % bolus 1,000 mL (0 mLs Intravenous Stopped 04/06/21 1828)    ED Course  I have reviewed the triage vital signs and the nursing notes.  Pertinent labs & imaging results that  were available during my care of the patient were reviewed by me and considered in my medical decision making (see chart for details).    MDM Rules/Calculators/A&P                           BP (!) 164/66   Pulse (!) 59   Temp 98.3 F (36.8 C) (Rectal)   Resp 20   SpO2 98%   Final Clinical Impression(s) / ED Diagnoses Final diagnoses:  Syncope, unspecified syncope type  COVID-19 virus infection    Rx / DC Orders ED  Discharge Orders     None      Patient sent here from nursing facility for evaluation of syncope after being diagnosed with COVID-19 for the past week.  She report decrease in appetite and having eating and drinking much but denies recalling any syncope nor endorsed any new injuries.  Per updated triage note, patient is currently at [facility] for rehab due to her recent back injury status post surgery.  While walking to the bathroom patient had a brief witnessed syncopal episode in which she was down to the ground and syncopized for approximately 10 to 20 seconds without hitting her head or suffer any injury.  4:18 PM Unfortunately patient is unable to recall her syncopal episode and she is currently on Xarelto, despite not having any obvious signs of head cervical spine injury, will obtain head and C-spine CT.  Care discussed with Dr. Tyrone Nine.  Anticipate admission. IVF given. I have reached out to pt's son to discuss her care.    5:55 PM Fortunately her CT scan of the head cervical spine and chest x-ray without any acute concerning finding. Labs are reassuring, however in the setting of advance age, recent positive covid infection and syncope I have consulted [facility] Dr. Myna Hidalgo who agrees to see and will admit pt for observation and hydration.   RYNA BECKSTROM was evaluated in Emergency Department on 04/06/2021 for the symptoms described in the history of present illness. She was evaluated in the context of the global COVID-19 pandemic, which necessitated  consideration that the patient might be at risk for infection with the SARS-CoV-2 virus that causes COVID-19. Institutional protocols and algorithms that pertain to the evaluation of patients at risk for COVID-19 are in a state of rapid change based on information released by regulatory bodies including the CDC and federal and state organizations. These policies and algorithms were followed during the patient's care in the ED.    Domenic Moras, PA-C 04/06/21 2037    Deno Etienne, DO 04/06/21 2048

## 2021-04-07 ENCOUNTER — Observation Stay (HOSPITAL_BASED_OUTPATIENT_CLINIC_OR_DEPARTMENT_OTHER): Payer: Medicare Other

## 2021-04-07 DIAGNOSIS — R55 Syncope and collapse: Secondary | ICD-10-CM

## 2021-04-07 LAB — COMPREHENSIVE METABOLIC PANEL
ALT: 32 U/L (ref 0–44)
AST: 36 U/L (ref 15–41)
Albumin: 2.9 g/dL — ABNORMAL LOW (ref 3.5–5.0)
Alkaline Phosphatase: 84 U/L (ref 38–126)
Anion gap: 11 (ref 5–15)
BUN: 11 mg/dL (ref 8–23)
CO2: 25 mmol/L (ref 22–32)
Calcium: 8.4 mg/dL — ABNORMAL LOW (ref 8.9–10.3)
Chloride: 104 mmol/L (ref 98–111)
Creatinine, Ser: 0.87 mg/dL (ref 0.44–1.00)
GFR, Estimated: >60 mL/min (ref >60)
Glucose, Bld: 89 mg/dL (ref 70–99)
Potassium: 3.5 mmol/L (ref 3.5–5.1)
Sodium: 140 mmol/L (ref 135–145)
Total Bilirubin: 1.1 mg/dL (ref 0.3–1.2)
Total Protein: 6.1 g/dL — ABNORMAL LOW (ref 6.5–8.1)

## 2021-04-07 LAB — URINALYSIS, ROUTINE W REFLEX MICROSCOPIC
Bilirubin Urine: NEGATIVE
Glucose, UA: NEGATIVE mg/dL
Ketones, ur: 20 mg/dL — AB
Nitrite: NEGATIVE
Protein, ur: 30 mg/dL — AB
Specific Gravity, Urine: 1.026 (ref 1.005–1.030)
WBC, UA: >50 WBC/hpf — ABNORMAL HIGH (ref 0–5)
pH: 5 (ref 5.0–8.0)

## 2021-04-07 LAB — MAGNESIUM: Magnesium: 1.9 mg/dL (ref 1.7–2.4)

## 2021-04-07 LAB — ECHOCARDIOGRAM COMPLETE
Area-P 1/2: 2.8 cm2
Calc EF: 60.2 %
S' Lateral: 2.2 cm
Single Plane A2C EF: 63.1 %
Single Plane A4C EF: 59.8 %

## 2021-04-07 LAB — CBC
HCT: 38.1 % (ref 36.0–46.0)
Hemoglobin: 12.5 g/dL (ref 12.0–15.0)
MCH: 30.9 pg (ref 26.0–34.0)
MCHC: 32.8 g/dL (ref 30.0–36.0)
MCV: 94.3 fL (ref 80.0–100.0)
Platelets: 150 10*3/uL (ref 150–400)
RBC: 4.04 MIL/uL (ref 3.87–5.11)
RDW: 13.3 % (ref 11.5–15.5)
WBC: 4.5 10*3/uL (ref 4.0–10.5)
nRBC: 0 % (ref 0.0–0.2)

## 2021-04-07 LAB — PROCALCITONIN
Procalcitonin: <0.1 ng/mL
Procalcitonin: <0.1 ng/mL

## 2021-04-07 LAB — D-DIMER, QUANTITATIVE: D-Dimer, Quant: 0.89 ug/mL-FEU — ABNORMAL HIGH (ref 0.00–0.50)

## 2021-04-07 LAB — CBG MONITORING, ED: Glucose-Capillary: 81 mg/dL (ref 70–99)

## 2021-04-07 LAB — C-REACTIVE PROTEIN: CRP: 3.3 mg/dL — ABNORMAL HIGH (ref <1.0)

## 2021-04-07 MED ORDER — HYDRALAZINE HCL 20 MG/ML IJ SOLN
5.0000 mg | Freq: Four times a day (QID) | INTRAMUSCULAR | Status: DC | PRN
  Administered 2021-04-07 – 2021-04-11 (×8): 5 mg via INTRAVENOUS
  Filled 2021-04-07 (×7): qty 1

## 2021-04-07 MED ORDER — HYDRALAZINE HCL 25 MG PO TABS
25.0000 mg | ORAL_TABLET | Freq: Four times a day (QID) | ORAL | Status: DC | PRN
  Filled 2021-04-07 (×2): qty 1

## 2021-04-07 NOTE — ED Notes (Signed)
Pt began vomiting during orthostatics. While sitting on the side of the bed, pt began vomiting and could not continue with vital signs.

## 2021-04-07 NOTE — ED Notes (Signed)
MD notified of pt's increased BP.

## 2021-04-07 NOTE — ED Notes (Signed)
Dr. Myna Hidalgo advised to give morning medications earlier than 1000 scheduled time. Pharmacy notified, stated no need to re-time medications in [facility] just to select "Not given at scheduled time".

## 2021-04-07 NOTE — Progress Notes (Signed)
PROGRESS NOTE    [name] Small  DGU:[id number] DOB: Aug 17, 1928 DOA: 04/06/2021 PCP: Billie Ruddy, MD   Chief Complaint  Patient presents with   Loss of Consciousness   Brief Narrative/Hospital Course:  [name] Small, 87 y.o. female with PMH of  atrial fibrillation on Xarelto, sick sinus syndrome with pacer, mild renal insufficiency, hypertension, and vertebral compression fracture status post vertebroplasty on 03/21/2021 presented from the skilled nursing facility with loss of appetite malaise fatigue and cough since 11/13 tested positive for COVID-19 at the facility and also had syncopal episode 11/15 at the facility witnessed by the staff while she was using the restroom.. In the ED afebrile first-degree block on the EKG RBBB incomplete, LAFB, chest x-ray negative for acute finding, CT head CT cervical spine no acute finding COVID-19 PCR is still positive with cycle threshold 22.3.  Patient was given IV fluids remdesivir and admitted.   Subjective: Seen this morning echo is being done.  Patient still endorses very low appetite has not eaten anything.  She is alert awake oriented and appears to be at baseline mentation Overnight blood pressure running high  Assessment & Plan:  COVID-19 infection: Chest x-ray clear not hypoxic.  Placed on remdesivir given high risk Monitor inflammatory markers.  Upper symptomatic from COVID with generalized weakness low appetite.  Syncope in the setting of decreased oral intake in the setting of COVID-19 infection: Multifactorial, continue work-up -echocardiogram orthostatic vitals.  Continue with iv hydration for now 2/2 low po intake.  Abnormal UA/pyuria but no fever checked procalcitonin- is normal- send urine culture.  Patient did not endorse dysuria or incontinence.  Debility generalized deconditioning weakness: PTOT eval. supportive  Essential hypertension-accelerated hypertension blood pressure running high resume home meds add additional IV as  needed meds  Atrial fibrillation with RVR-CHADs VASc=4, continue Xarelto, amiodarone metoprolol  CKD stage IIIa Baseline creatinine 0.94 on admission is stable Recent Labs  Lab 04/06/21 1514 04/07/21 0244  BUN 15 11  CREATININE 0.94 0.87    DVT prophylaxis:  Code Status:   Code Status: Full Code Family Communication: plan of care discussed with patient at bedside. Status is: Observation Remains hospitalized for ongoing management of severe generalized weakness low oral intake COVID-19 infection and syncope. Disposition: Currently not medically stable for discharge. Anticipated Disposition: TBD   Objective: Vitals last 24 hrs: Vitals:   04/07/21 0800 04/07/21 0900 04/07/21 1000 04/07/21 1100  BP: (!) 188/76 (!) 202/77 (!) 177/64 (!) 171/64  Pulse: 60 60 (!) 59 60  Resp: 15 12 16 17   Temp:      TempSrc:      SpO2: 97% 97% 98% 98%   Weight change:   Intake/Output Summary (Last 24 hours) at 04/07/2021 1138 Last data filed at 04/06/2021 1828 Gross per 24 hour  Intake 1000 ml  Output --  Net 1000 ml   Net IO Since Admission: 1,000 mL [04/07/21 1138]   Physical Examination: General exam: AA0X3, weak,older than stated age. HEENT:Oral mucosa moist, Ear/Nose WNL grossly,dentition normal. Respiratory system: B/l diminished BS, no use of accessory muscle, non tender. Cardiovascular system: S1 & S2 +,No JVD. Gastrointestinal system: Abdomen soft, NT,ND, BS+. Nervous System:Alert, awake, moving extremities. Extremities: edema none, distal peripheral pulses palpable.  Skin: No rashes, no icterus. MSK: Normal muscle bulk, tone, power.  Medications reviewed:  Scheduled Meds:  amiodarone  200 mg Oral Q lunch   atorvastatin  40 mg Oral Q lunch   docusate sodium  100 mg Oral BID  losartan  50 mg Oral Daily   metoprolol tartrate  25 mg Oral BID   polyvinyl alcohol  1 drop Both Eyes Daily   Rivaroxaban  15 mg Oral Q lunch   sodium chloride flush  3 mL Intravenous Q12H    Continuous Infusions:  sodium chloride 75 mL/hr at 04/07/21 0032   remdesivir 100 mg in NS 100 mL Stopped (04/07/21 1011)    Diet Order             Diet Heart Room service appropriate? Yes; Fluid consistency: Thin  Diet effective now                          Weight change:   Wt Readings from Last 3 Encounters:  03/16/21 54.4 kg  03/09/21 54.4 kg  03/07/21 54.4 kg     Consultants:see note  Procedures:see note Antimicrobials: Anti-infectives (From admission, onward)    Start     Dose/Rate Route Frequency Ordered Stop   04/07/21 1000  remdesivir 100 mg in sodium chloride 0.9 % 100 mL IVPB       See Hyperspace for full Linked Orders Report.   100 mg 200 mL/hr over 30 Minutes Intravenous Daily 04/06/21 2124 04/09/21 0959   04/06/21 2130  remdesivir 200 mg in sodium chloride 0.9% 250 mL IVPB       See Hyperspace for full Linked Orders Report.   200 mg 580 mL/hr over 30 Minutes Intravenous Once 04/06/21 2124 04/06/21 2357      Culture/Microbiology    Component Value Date/Time   SDES URINE, RANDOM 12/07/2014 1041   SPECREQUEST Normal 12/07/2014 1041   CULT  12/07/2014 1041    MULTIPLE SPECIES PRESENT, SUGGEST RECOLLECTION IF CLINICALLY INDICATED   REPTSTATUS 12/09/2014 FINAL 12/07/2014 1041    Other culture-see note  Unresulted Labs (From admission, onward)     Start     Ordered   04/08/21 0500  Procalcitonin  Daily,   R      04/07/21 0858   04/08/21 0500  Comprehensive metabolic panel  Daily,   R      04/07/21 0859   04/08/21 0500  CBC  Daily,   R      04/07/21 0859   04/07/21 0904  Urine Culture  Add-on,   AD       Question:  Indication  Answer:  Dysuria   04/07/21 0903   04/07/21 0859  Procalcitonin - Baseline  Add-on,   AD        04/07/21 0858   04/06/21 1514  CBC WITH DIFFERENTIAL  ONCE - STAT,   STAT        04/06/21 1514          Data Reviewed: I have personally reviewed following labs and imaging studies CBC: Recent Labs  Lab  04/06/21 1840 04/07/21 0244  WBC 6.0 4.5  NEUTROABS 4.4  --   HGB 13.0 12.5  HCT 40.6 38.1  MCV 94.2 94.3  PLT 142* 782   Basic Metabolic Panel: Recent Labs  Lab 04/06/21 1514 04/07/21 0244  NA 138 140  K 3.6 3.5  CL 100 104  CO2 27 25  GLUCOSE 136* 89  BUN 15 11  CREATININE 0.94 0.87  CALCIUM 8.9 8.4*  MG  --  1.9   GFR: CrCl cannot be calculated (Unknown ideal weight.). Liver Function Tests: Recent Labs  Lab 04/06/21 2221 04/07/21 0244  AST 38 36  ALT 34 32  ALKPHOS  88 84  BILITOT 0.9 1.1  PROT 6.1* 6.1*  ALBUMIN 3.0* 2.9*   No results for input(s): LIPASE, AMYLASE in the last 168 hours. No results for input(s): AMMONIA in the last 168 hours. Coagulation Profile: No results for input(s): INR, PROTIME in the last 168 hours. Cardiac Enzymes: No results for input(s): CKTOTAL, CKMB, CKMBINDEX, TROPONINI in the last 168 hours. BNP (last 3 results) No results for input(s): PROBNP in the last 8760 hours. HbA1C: No results for input(s): HGBA1C in the last 72 hours. CBG: Recent Labs  Lab 04/06/21 1616 04/07/21 0614  GLUCAP 131* 81   Lipid Profile: No results for input(s): CHOL, HDL, LDLCALC, TRIG, CHOLHDL, LDLDIRECT in the last 72 hours. Thyroid Function Tests: No results for input(s): TSH, T4TOTAL, FREET4, T3FREE, THYROIDAB in the last 72 hours. Anemia Panel: Recent Labs    04/06/21 2221  FERRITIN 418*   Sepsis Labs: Recent Labs  Lab 04/06/21 1534 04/06/21 1800 04/06/21 2221  PROCALCITON  --   --  <0.10  LATICACIDVEN 1.6 1.5  --     Recent Results (from the past 240 hour(s))  Resp Panel by RT-PCR (Flu A&B, Covid) Nasopharyngeal Swab     Status: Abnormal   Collection Time: 04/06/21  3:14 PM   Specimen: Nasopharyngeal Swab; Nasopharyngeal(NP) swabs in vial transport medium  Result Value Ref Range Status   SARS Coronavirus 2 by RT PCR POSITIVE (A) NEGATIVE Final    Comment: RESULT CALLED TO, READ BACK BY AND VERIFIED WITH: CHRIS CRISCOE  04/06/2021 @2044  BY JW (NOTE) SARS-CoV-2 target nucleic acids are DETECTED.  The SARS-CoV-2 RNA is generally detectable in upper respiratory specimens during the acute phase of infection. Positive results are indicative of the presence of the identified virus, but do not rule out bacterial infection or co-infection with other pathogens not detected by the test. Clinical correlation with patient history and other diagnostic information is necessary to determine patient infection status. The expected result is Negative.  Fact Sheet for Patients: EntrepreneurPulse.com.au  Fact Sheet for Healthcare Providers: IncredibleEmployment.be  This test is not yet approved or cleared by the [place] FDA and  has been authorized for detection and/or diagnosis of SARS-CoV-2 by FDA under an Emergency Use Authorization (EUA).  This EUA will remain in effect (meaning this test can  be used) for the duration of  the COVID-19 declaration under Section 564(b)(1) of the Act, 21 U.S.C. section 360bbb-3(b)(1), unless the authorization is terminated or revoked sooner.     Influenza A by PCR NEGATIVE NEGATIVE Final   Influenza B by PCR NEGATIVE NEGATIVE Final    Comment: (NOTE) The Xpert Xpress SARS-CoV-2/FLU/RSV plus assay is intended as an aid in the diagnosis of influenza from Nasopharyngeal swab specimens and should not be used as a sole basis for treatment. Nasal washings and aspirates are unacceptable for Xpert Xpress SARS-CoV-2/FLU/RSV testing.  Fact Sheet for Patients: EntrepreneurPulse.com.au  Fact Sheet for Healthcare Providers: IncredibleEmployment.be  This test is not yet approved or cleared by the [place] FDA and has been authorized for detection and/or diagnosis of SARS-CoV-2 by FDA under an Emergency Use Authorization (EUA). This EUA will remain in effect (meaning this test can be used) for the duration  of the COVID-19 declaration under Section 564(b)(1) of the Act, 21 U.S.C. section 360bbb-3(b)(1), unless the authorization is terminated or revoked.  Performed at [facility], [place] [address], [place], [place] [address]      Radiology Studies: CT Head Wo Contrast  Result Date:  04/06/2021 CLINICAL DATA:  Syncope. EXAM: CT HEAD WITHOUT CONTRAST TECHNIQUE: Contiguous axial images were obtained from the base of the skull through the vertex without intravenous contrast. COMPARISON:  03/07/2021 FINDINGS: Brain: There is atrophy and chronic small vessel disease changes. No acute intracranial abnormality. Specifically, no hemorrhage, hydrocephalus, mass lesion, acute infarction, or significant intracranial injury. Vascular: No hyperdense vessel or unexpected calcification. Skull: No acute calvarial abnormality. Sinuses/Orbits: No acute findings Other: None IMPRESSION: Atrophy, chronic microvascular disease. No acute intracranial abnormality. Electronically Signed   By: Rolm Baptise M.D.   On: 04/06/2021 17:17   CT Cervical Spine Wo Contrast  Result Date: 04/06/2021 CLINICAL DATA:  Syncope EXAM: CT CERVICAL SPINE WITHOUT CONTRAST TECHNIQUE: Multidetector CT imaging of the cervical spine was performed without intravenous contrast. Multiplanar CT image reconstructions were also generated. COMPARISON:  None. FINDINGS: Alignment: Normal Skull base and vertebrae: No acute fracture. No primary bone lesion or focal pathologic process. Soft tissues and spinal canal: No prevertebral fluid or swelling. No visible canal hematoma. Disc levels:  Maintained Upper chest: Biapical scarring. Other: None IMPRESSION: No acute bony abnormality. Electronically Signed   By: Rolm Baptise M.D.   On: 04/06/2021 17:18   DG Chest Port 1 View  Result Date: 04/06/2021 CLINICAL DATA:  Cough, COVID EXAM: PORTABLE CHEST 1 VIEW COMPARISON:  11/26/2015 FINDINGS: Left-sided implanted cardiac device with stable positioning of  leads. Heart size within normal limits. Atherosclerotic calcification of the aortic knob. No focal airspace consolidation, pleural effusion, or pneumothorax. No acute bony findings. IMPRESSION: No acute cardiopulmonary findings. Electronically Signed   By: Davina Poke D.O.   On: 04/06/2021 15:42     LOS: 0 days   Antonieta Pert, MD [facility]  04/07/2021, 11:38 AM

## 2021-04-08 ENCOUNTER — Observation Stay (HOSPITAL_COMMUNITY): Payer: Medicare Other

## 2021-04-08 DIAGNOSIS — J9811 Atelectasis: Secondary | ICD-10-CM | POA: Diagnosis not present

## 2021-04-08 DIAGNOSIS — I452 Bifascicular block: Secondary | ICD-10-CM | POA: Diagnosis present

## 2021-04-08 DIAGNOSIS — Z79899 Other long term (current) drug therapy: Secondary | ICD-10-CM | POA: Diagnosis not present

## 2021-04-08 DIAGNOSIS — N1831 Chronic kidney disease, stage 3a: Secondary | ICD-10-CM | POA: Diagnosis present

## 2021-04-08 DIAGNOSIS — E785 Hyperlipidemia, unspecified: Secondary | ICD-10-CM | POA: Diagnosis present

## 2021-04-08 DIAGNOSIS — R55 Syncope and collapse: Secondary | ICD-10-CM | POA: Diagnosis present

## 2021-04-08 DIAGNOSIS — N390 Urinary tract infection, site not specified: Secondary | ICD-10-CM | POA: Diagnosis present

## 2021-04-08 DIAGNOSIS — M810 Age-related osteoporosis without current pathological fracture: Secondary | ICD-10-CM | POA: Diagnosis present

## 2021-04-08 DIAGNOSIS — E876 Hypokalemia: Secondary | ICD-10-CM | POA: Diagnosis present

## 2021-04-08 DIAGNOSIS — B9620 Unspecified Escherichia coli [E. coli] as the cause of diseases classified elsewhere: Secondary | ICD-10-CM | POA: Diagnosis present

## 2021-04-08 DIAGNOSIS — Z950 Presence of cardiac pacemaker: Secondary | ICD-10-CM | POA: Diagnosis not present

## 2021-04-08 DIAGNOSIS — I129 Hypertensive chronic kidney disease with stage 1 through stage 4 chronic kidney disease, or unspecified chronic kidney disease: Secondary | ICD-10-CM | POA: Diagnosis present

## 2021-04-08 DIAGNOSIS — K5730 Diverticulosis of large intestine without perforation or abscess without bleeding: Secondary | ICD-10-CM | POA: Diagnosis present

## 2021-04-08 DIAGNOSIS — Z882 Allergy status to sulfonamides status: Secondary | ICD-10-CM | POA: Diagnosis not present

## 2021-04-08 DIAGNOSIS — Z888 Allergy status to other drugs, medicaments and biological substances status: Secondary | ICD-10-CM | POA: Diagnosis not present

## 2021-04-08 DIAGNOSIS — I700 Atherosclerosis of aorta: Secondary | ICD-10-CM | POA: Diagnosis not present

## 2021-04-08 DIAGNOSIS — J40 Bronchitis, not specified as acute or chronic: Secondary | ICD-10-CM | POA: Diagnosis not present

## 2021-04-08 DIAGNOSIS — U071 COVID-19: Secondary | ICD-10-CM | POA: Diagnosis present

## 2021-04-08 DIAGNOSIS — Z8249 Family history of ischemic heart disease and other diseases of the circulatory system: Secondary | ICD-10-CM | POA: Diagnosis not present

## 2021-04-08 DIAGNOSIS — I440 Atrioventricular block, first degree: Secondary | ICD-10-CM | POA: Diagnosis present

## 2021-04-08 DIAGNOSIS — Z7901 Long term (current) use of anticoagulants: Secondary | ICD-10-CM | POA: Diagnosis not present

## 2021-04-08 DIAGNOSIS — I480 Paroxysmal atrial fibrillation: Secondary | ICD-10-CM | POA: Diagnosis present

## 2021-04-08 DIAGNOSIS — I495 Sick sinus syndrome: Secondary | ICD-10-CM | POA: Diagnosis present

## 2021-04-08 DIAGNOSIS — I951 Orthostatic hypotension: Secondary | ICD-10-CM | POA: Diagnosis present

## 2021-04-08 LAB — CBC
HCT: 39.2 % (ref 36.0–46.0)
Hemoglobin: 13.1 g/dL (ref 12.0–15.0)
MCH: 30.5 pg (ref 26.0–34.0)
MCHC: 33.4 g/dL (ref 30.0–36.0)
MCV: 91.2 fL (ref 80.0–100.0)
Platelets: 199 10*3/uL (ref 150–400)
RBC: 4.3 MIL/uL (ref 3.87–5.11)
RDW: 13.3 % (ref 11.5–15.5)
WBC: 6 10*3/uL (ref 4.0–10.5)
nRBC: 0 % (ref 0.0–0.2)

## 2021-04-08 LAB — COMPREHENSIVE METABOLIC PANEL
ALT: 32 U/L (ref 0–44)
AST: 37 U/L (ref 15–41)
Albumin: 2.9 g/dL — ABNORMAL LOW (ref 3.5–5.0)
Alkaline Phosphatase: 83 U/L (ref 38–126)
Anion gap: 12 (ref 5–15)
BUN: 15 mg/dL (ref 8–23)
CO2: 22 mmol/L (ref 22–32)
Calcium: 8.6 mg/dL — ABNORMAL LOW (ref 8.9–10.3)
Chloride: 103 mmol/L (ref 98–111)
Creatinine, Ser: 0.9 mg/dL (ref 0.44–1.00)
GFR, Estimated: >60 mL/min (ref >60)
Glucose, Bld: 104 mg/dL — ABNORMAL HIGH (ref 70–99)
Potassium: 3 mmol/L — ABNORMAL LOW (ref 3.5–5.1)
Sodium: 137 mmol/L (ref 135–145)
Total Bilirubin: 1.1 mg/dL (ref 0.3–1.2)
Total Protein: 6 g/dL — ABNORMAL LOW (ref 6.5–8.1)

## 2021-04-08 LAB — PROCALCITONIN: Procalcitonin: <0.1 ng/mL

## 2021-04-08 MED ORDER — POTASSIUM CHLORIDE CRYS ER 20 MEQ PO TBCR
40.0000 meq | EXTENDED_RELEASE_TABLET | Freq: Once | ORAL | Status: AC
  Administered 2021-04-08: 40 meq via ORAL
  Filled 2021-04-08: qty 2

## 2021-04-08 MED ORDER — POTASSIUM CHLORIDE CRYS ER 20 MEQ PO TBCR
20.0000 meq | EXTENDED_RELEASE_TABLET | Freq: Every day | ORAL | Status: DC
  Administered 2021-04-08: 20 meq via ORAL
  Filled 2021-04-08 (×2): qty 1

## 2021-04-08 MED ORDER — LACTATED RINGERS IV SOLN: INTRAVENOUS | Status: DC

## 2021-04-08 NOTE — TOC Initial Note (Signed)
Transition of Care [facility]) - Initial/Assessment Note    Patient Details  Name: [name] Small MRN: [id number] Date of Birth: 08/26/1928  Transition of Care [facility]) CM/SW Contact:    Tresa Endo Phone Number: 04/08/2021, 10:04 AM  Clinical Narrative:                 CSW received SNF consult. CSW introduced self and explained role at the hospital. CSW spoke with pt son and answered his questions about DC back to [facility]. Pt reports that PTA the pt as at [facility] for short term rehab. Pt having generalized weakness and fatigue. PT has recommended that the pt returns to SNF for rehab.   CSW will continue to follow.    Expected Discharge Plan: [place] Barriers to Discharge: Continued Medical Work up   Patient Goals and CMS Choice Patient states their goals for this hospitalization and ongoing recovery are:: Return to [employer] Compare Post Acute Care list provided to:: Patient Choice offered to / list presented to : Patient  Expected Discharge Plan and Services Expected Discharge Plan: [place] In-house Referral: Clinical Social Work   Post Acute Care Choice: [place] Living arrangements for the past 2 months: [place]                                      Prior Living Arrangements/Services Living arrangements for the past 2 months: [place] Lives with:: Facility Resident Patient language and need for interpreter reviewed:: Yes Do you feel safe going back to the place where you live?: Yes      Need for Family Participation in Patient Care: No (Comment) Care giver support system in place?: Yes (comment)   Criminal Activity/Legal Involvement Pertinent to Current Situation/Hospitalization: No - Comment as needed  Activities of Daily Living Home Assistive Devices/Equipment: Cane (specify quad or straight), Eyeglasses, Walker (specify type) ADL Screening  (condition at time of admission) Patient's cognitive ability adequate to safely complete daily activities?: Yes Is the patient deaf or have difficulty hearing?: No Does the patient have difficulty seeing, even when wearing glasses/contacts?: No Does the patient have difficulty concentrating, remembering, or making decisions?: No Patient able to express need for assistance with ADLs?: Yes Does the patient have difficulty dressing or bathing?: No Independently performs ADLs?: Yes (appropriate for developmental age) Does the patient have difficulty walking or climbing stairs?: No Weakness of Legs: Both Weakness of Arms/Hands: None  Permission Sought/Granted Permission sought to share information with : Facility Sport and exercise psychologist, Family Supports Permission granted to share information with : Yes, Verbal Permission Granted  Share Information with NAME: [place],Donald (Son)   [phone]  Permission granted to share info w AGENCY: [place] granted to share info w Relationship: Bednarczyk,Donald (Son)   [phone]  Permission granted to share info w Contact Information: [place],Donald Pandora Leiter)   [phone]  Emotional Assessment Appearance:: Appears stated age Attitude/Demeanor/Rapport: Unable to Assess Affect (typically observed): Unable to Assess Orientation: : Oriented to Self, Oriented to Place, Oriented to  Time, Oriented to Situation Alcohol / Substance Use: Not Applicable Psych Involvement: No (comment)  Admission diagnosis:  Syncope [R55] Syncope, unspecified syncope type [R55] COVID-19 virus infection [U07.1] Patient Active Problem List   Diagnosis Date Noted   Syncope 04/06/2021   Stage 3a chronic kidney disease (CKD) ([place]) 04/06/2021   COVID-19 virus infection  04/06/2021   Compression fracture of L2 ([place]) 03/17/2021   Lumbar compression fracture ([place]) 03/17/2021   Hypertension    Low back pain 03/16/2021   Hypertensive urgency 03/16/2021   PAF (paroxysmal  atrial fibrillation) ([place]) 03/16/2021   Compression fracture of L2 lumbar vertebra ([facility])    Porokeratosis 05/02/2019   Hav (hallux abducto valgus), unspecified laterality 05/02/2019   Hyperglycemia 12/07/2014   Atrial fibrillation with RVR-CHADs VASc=4 12/07/2014   Intertrochanteric fracture of left hip ([place]) 07/08/2014   Overactive bladder 04/02/2012   Essential hypertension 04/02/2012   Chest pain, atypical 09/17/2011   Dyslipidemia 11/22/2006   DIVERTICULOSIS, COLON 11/22/2006   Osteoporosis 11/22/2006   PCP:  Billie Ruddy, MD Pharmacy:   [facility] [place], [place] - [place] [address] [place] [address] Phone: [phone] Fax: [phone]     Social Determinants of Health (SDOH) Interventions    Readmission Risk Interventions No flowsheet data found.

## 2021-04-08 NOTE — Evaluation (Signed)
Occupational Therapy Evaluation Patient Details Name: ELLIANNE Small MRN: [id number] DOB: 1928/08/26 Today's Date: 04/08/2021   History of Present Illness [name] Small, 87 y.o. female admitted 11/16  presenting from skilled nursing facility with loss of appetite, malaise fatigue and cough since 11/13 tested positive for COVID-19 at the facility and also had syncopal episode 11/15 at the facility witnessed by the staff while she was using the restroom.  In the ED afebrile  with first-degree block on the EKG.   PMH: atrial fibrillation on Xarelto, sick sinus syndrome with pacer, mild renal insufficiency, hypertension, and vertebral compression fracture status post vertebroplasty on 03/21/2021   Clinical Impression   Patient admitted for the diagnosis above.  PTA she was undergoing short term rehab at a local SNF after a back injury.  Patient has been needing assist for lower body ADL and mobility at a RW level at the facility.  Prior to this she was living alone with support from family at home.  Deficits impacting independence are listed below.  Currently she is needing up to Min A for basic mobility and lower body ADL from a seated position. OT will follow in the acute setting, but she anticipates returning to the SNF for continued rehab prior to returning home.         Recommendations for follow up therapy are one component of a multi-disciplinary discharge planning process, led by the attending physician.  Recommendations may be updated based on patient status, additional functional criteria and insurance authorization.   Follow Up Recommendations  Skilled nursing-short term rehab (<3 hours/day)    Assistance Recommended at Discharge Frequent or constant Supervision/Assistance  Functional Status Assessment  Patient has had a recent decline in their functional status and demonstrates the ability to make significant improvements in function in a reasonable and predictable amount of time.  Equipment  Recommendations  None recommended by OT    Recommendations for Other Services       Precautions / Restrictions Precautions Precautions: Fall;Back Precaution Booklet Issued: No Precaution Comments: followed back precautions for comfort, COVID Restrictions Weight Bearing Restrictions: No      Mobility Bed Mobility Overal bed mobility: Needs Assistance Bed Mobility: Sidelying to Sit   Sidelying to sit: Min guard            Transfers Overall transfer level: Needs assistance Equipment used: Rolling walker (2 wheels) Transfers: Sit to/from Stand;Bed to chair/wheelchair/BSC Sit to Stand: Min guard     Step pivot transfers: Min guard            Balance Overall balance assessment: Needs assistance Sitting-balance support: No upper extremity supported;Feet supported Sitting balance-Leahy Scale: Fair     Standing balance support: Reliant on assistive device for balance Standing balance-Leahy Scale: Poor Standing balance comment: reliant on UE support                           ADL either performed or assessed with clinical judgement   ADL       Grooming: Set up;Sitting           Upper Body Dressing : Minimal assistance;Sitting   Lower Body Dressing: Minimal assistance;Sitting/lateral leans   Toilet Transfer: Min guard;BSC/3in1;Rolling walker (2 wheels);Stand-pivot                   Vision Baseline Vision/History: 1 Wears glasses Patient Visual Report: No change from baseline       Perception Perception Perception:  Not tested   Praxis Praxis Praxis: Not tested    Pertinent Vitals/Pain Pain Assessment: No/denies pain Pain Intervention(s): Monitored during session     Hand Dominance Right   Extremity/Trunk Assessment Upper Extremity Assessment Upper Extremity Assessment: Generalized weakness   Lower Extremity Assessment Lower Extremity Assessment: Defer to PT evaluation   Cervical / Trunk Assessment Cervical / Trunk  Assessment: Kyphotic   Communication Communication Communication: No difficulties   Cognition Arousal/Alertness: Awake/alert Behavior During Therapy: WFL for tasks assessed/performed Overall Cognitive Status: Impaired/Different from baseline Area of Impairment: Memory;Awareness;Safety/judgement                 Orientation Level: Disoriented to;Time;Situation   Memory: Decreased recall of precautions;Decreased short-term memory   Safety/Judgement: Decreased awareness of safety                            Home Living Family/patient expects to be discharged to:: Skilled nursing facility Living Arrangements: Alone Available Help at Discharge: Family;Available PRN/intermittently Type of Home: House Home Access: Stairs to enter [employer] of Steps: 6 Entrance Stairs-Rails: Left Home Layout: One level     Bathroom Shower/Tub: Teacher, early years/pre: Standard     Home Equipment: Chartered certified accountant;Shower seat          Prior Functioning/Environment         Physical Assist : Mobility (physical);ADLs (physical) Mobility (physical): Gait ADLs (physical): Bathing;Dressing;IADLs Mobility Comments: used standard walker at home prior to recent admit. Son reported 3-4 falls in past months.  Pt was using RW at facility short distances with min assist. ADLs Comments: Patient reports requiring assist for bathing and dressing from daughter in law when she was at home and facility has been providing assist recently.        OT Problem List: Decreased strength;Decreased range of motion;Impaired balance (sitting and/or standing);Decreased knowledge of precautions;Decreased cognition;Pain      OT Treatment/Interventions: Self-care/ADL training;Patient/family education;DME and/or AE instruction;Balance training    OT Goals(Current goals can be found in the care plan section) Acute Rehab OT Goals Patient Stated Goal: I'd like to go home OT Goal  Formulation: With patient Time For Goal Achievement: 04/22/21 Potential to Achieve Goals: Good ADL Goals Pt Will Perform Grooming: with supervision;sitting;standing Pt Will Perform Lower Body Bathing: with supervision;sit to/from stand Pt Will Perform Lower Body Dressing: with supervision;sit to/from stand Pt Will Transfer to Toilet: with supervision;ambulating;regular height toilet Pt Will Perform Toileting - Clothing Manipulation and hygiene: with supervision;sit to/from stand  OT Frequency: Min 2X/week   Barriers to D/C: Decreased caregiver support          Co-evaluation              AM-PAC OT "6 Clicks" Daily Activity     Outcome Measure Help from another person eating meals?: None Help from another person taking care of personal grooming?: None Help from another person toileting, which includes using toliet, bedpan, or urinal?: A Little Help from another person bathing (including washing, rinsing, drying)?: A Little Help from another person to put on and taking off regular upper body clothing?: A Little Help from another person to put on and taking off regular lower body clothing?: A Little 6 Click Score: 20   End of Session Equipment Utilized During Treatment: Rolling walker (2 wheels) Nurse Communication: Mobility status  Activity Tolerance: Patient limited by fatigue Patient left: in chair;with call bell/phone within reach  OT Visit Diagnosis: Unsteadiness  on feet (R26.81);Other abnormalities of gait and mobility (R26.89);Muscle weakness (generalized) (M62.81)                Time: 1438-8875 OT Time Calculation (min): 22 min Charges:  OT General Charges $OT Visit: 1 Visit OT Evaluation $OT Eval Moderate Complexity: 1 Mod  04/08/2021  RP, OTR/L  Acute Rehabilitation Services  Office:  [phone]    Metta Clines 04/08/2021, 4:56 PM

## 2021-04-08 NOTE — NC FL2 (Signed)
[place] LEVEL OF CARE SCREENING TOOL     IDENTIFICATION  Patient Name: [name] Small Birthdate: Oct 07, 1928 Sex: female Admission Date (Current Location): 04/06/2021  [facility] [place] Number:  Herbalist and Address:  [facility], [place] [address], [place], [place] [address]      Provider Number: [id number]  Attending Physician Name and Address:  Antonieta Pert, MD  Relative Name and Phone Number:  Korah Hufstedler, [phone]    Current Level of Care: Hospital Recommended Level of Care: [place] Prior Approval Number:    Date Approved/Denied:   PASRR Number: [id number]  Discharge Plan: Home    Current Diagnoses: Patient Active Problem List   Diagnosis Date Noted   Syncope 04/06/2021   Stage 3a chronic kidney disease (CKD) ([place]) 04/06/2021   COVID-19 virus infection 04/06/2021   Compression fracture of L2 ([place]) 03/17/2021   Lumbar compression fracture ([place]) 03/17/2021   Hypertension    Low back pain 03/16/2021   Hypertensive urgency 03/16/2021   PAF (paroxysmal atrial fibrillation) ([place]) 03/16/2021   Compression fracture of L2 lumbar vertebra (HCC)    Porokeratosis 05/02/2019   Hav (hallux abducto valgus), unspecified laterality 05/02/2019   Hyperglycemia 12/07/2014   Atrial fibrillation with RVR-CHADs VASc=4 12/07/2014   Intertrochanteric fracture of left hip ([place]) 07/08/2014   Overactive bladder 04/02/2012   Essential hypertension 04/02/2012   Chest pain, atypical 09/17/2011   Dyslipidemia 11/22/2006   DIVERTICULOSIS, COLON 11/22/2006   Osteoporosis 11/22/2006    Orientation RESPIRATION BLADDER Height & Weight     Self, Time, Situation, Place  Normal Continent Weight: 109 lb 5.6 oz (49.6 kg) Height:  5\' 5"  (165.1 cm)  BEHAVIORAL SYMPTOMS/MOOD NEUROLOGICAL BOWEL NUTRITION STATUS      Continent    AMBULATORY STATUS COMMUNICATION OF NEEDS Skin   Limited Assist Verbally Normal                        Personal Care Assistance Level of Assistance  Bathing, Feeding, Dressing Bathing Assistance: Limited assistance Feeding assistance: Independent Dressing Assistance: Limited assistance     Functional Limitations Info  Sight, Speech, Hearing Sight Info: Impaired Hearing Info: Adequate Speech Info: Adequate    SPECIAL CARE FACTORS FREQUENCY  PT (By licensed PT), OT (By licensed OT)                    Contractures Contractures Info: Not present    Additional Factors Info  Allergies, Code Status Code Status Info: Full Allergies Info: Septra, Lisinopril           Current Medications (04/08/2021):  This is the current hospital active medication list Current Facility-Administered Medications  Medication Dose Route Frequency Provider Last Rate Last Admin   acetaminophen (TYLENOL) tablet 650 mg  650 mg Oral Q6H PRN Opyd, Ilene Qua, MD       Or   acetaminophen (TYLENOL) suppository 650 mg  650 mg Rectal Q6H PRN Opyd, Ilene Qua, MD       amiodarone (PACERONE) tablet 200 mg  200 mg Oral Q lunch Opyd, Ilene Qua, MD   200 mg at 04/07/21 1233   atorvastatin (LIPITOR) tablet 40 mg  40 mg Oral Q lunch Opyd, Ilene Qua, MD   40 mg at 04/07/21 1233   docusate sodium (COLACE) capsule 100 mg  100 mg Oral BID Vianne Bulls, MD   100 mg at 04/08/21 0855   guaiFENesin-dextromethorphan (ROBITUSSIN DM)  100-10 MG/5ML syrup 10 mL  10 mL Oral Q4H PRN Opyd, Ilene Qua, MD       hydrALAZINE (APRESOLINE) injection 5 mg  5 mg Intravenous Q6H PRN Kc, Ramesh, MD   5 mg at 04/07/21 2138   hydrALAZINE (APRESOLINE) tablet 25 mg  25 mg Oral Q6H PRN Antonieta Pert, MD       lactated ringers infusion   Intravenous Continuous Antonieta Pert, MD 75 mL/hr at 04/08/21 0853 New Bag at 04/08/21 0853   losartan (COZAAR) tablet 50 mg  50 mg Oral Daily Opyd, Ilene Qua, MD   50 mg at 04/08/21 0855   metoprolol tartrate (LOPRESSOR) tablet 25 mg  25 mg Oral BID Opyd, Ilene Qua, MD   25 mg at 04/08/21 0855   ondansetron  (ZOFRAN) tablet 4 mg  4 mg Oral Q6H PRN Opyd, Ilene Qua, MD       Or   ondansetron (ZOFRAN) injection 4 mg  4 mg Intravenous Q6H PRN Opyd, Ilene Qua, MD   4 mg at 04/07/21 2138   oxyCODONE (Oxy IR/ROXICODONE) immediate release tablet 5 mg  5 mg Oral Q4H PRN Opyd, Ilene Qua, MD   5 mg at 04/07/21 0604   polyvinyl alcohol (LIQUIFILM TEARS) 1.4 % ophthalmic solution 1 drop  1 drop Both Eyes Daily Opyd, Ilene Qua, MD   1 drop at 04/08/21 0856   potassium chloride SA (KLOR-CON) CR tablet 20 mEq  20 mEq Oral Daily Kc, Ramesh, MD   20 mEq at 04/08/21 3762   remdesivir 100 mg in sodium chloride 0.9 % 100 mL IVPB  100 mg Intravenous Daily Opyd, Ilene Qua, MD 200 mL/hr at 04/08/21 0858 100 mg at 04/08/21 0858   Rivaroxaban (XARELTO) tablet 15 mg  15 mg Oral Q lunch Opyd, Ilene Qua, MD   15 mg at 04/07/21 1233   sodium chloride flush (NS) 0.9 % injection 3 mL  3 mL Intravenous Q12H Opyd, Ilene Qua, MD   3 mL at 04/08/21 8315     Discharge Medications: Please see discharge summary for a list of discharge medications.  Relevant Imaging Results:  Relevant Lab Results:   Additional Information SSN: [id number], COVID pfizer 07/27/19, 07/04/19  Reece Agar, LCSWA

## 2021-04-08 NOTE — Evaluation (Signed)
Physical Therapy Evaluation Patient Details Name: [name] Small MRN: [id number] DOB: 1928/09/01 Today's Date: 04/08/2021  History of Present Illness  [name] Small, 87 y.o. female admitted 11/16  presenting from skilled nursing facility with loss of appetite, malaise fatigue and cough since 11/13 tested positive for COVID-19 at the facility and also had syncopal episode 11/15 at the facility witnessed by the staff while she was using the restroom.  In the ED afebrile  with first-degree block on the EKG.   PMH: atrial fibrillation on Xarelto, sick sinus syndrome with pacer, mild renal insufficiency, hypertension, and vertebral compression fracture status post vertebroplasty on 03/21/2021  Clinical Impression  Pt admitted with above diagnosis. Pt was able to stand to RW with min assist for safety. Orthostatic VS taken with pt very nauseated and needing to lie down after only sitting EOB and standing for a brief time. Poor endurance for activity currently.  Coughing a lot as well.  Sats stable on RA. Will follow acutely.  Pt currently with functional limitations due to the deficits listed below (see PT Problem List). Pt will benefit from skilled PT to increase their independence and safety with mobility to allow discharge to the venue listed below.       63 bpm, 131/89 supine; 68 bpm, 150/90 sitting; 65 bpm, 135/125 standing; 68 bpm, 120/105 standing after 3 min    Recommendations for follow up therapy are one component of a multi-disciplinary discharge planning process, led by the attending physician.  Recommendations may be updated based on patient status, additional functional criteria and insurance authorization.  Follow Up Recommendations Skilled nursing-short term rehab (<3 hours/day)    Assistance Recommended at Discharge Frequent or constant Supervision/Assistance  Functional Status Assessment Patient has had a recent decline in their functional status and/or demonstrates limited ability to make  significant improvements in function in a reasonable and predictable amount of time  Equipment Recommendations  Rolling walker (2 wheels)    Recommendations for Other Services       Precautions / Restrictions Precautions Precautions: Fall;Back Precaution Booklet Issued: No Precaution Comments: followed back precautions for comfort, COVID Restrictions Weight Bearing Restrictions: No      Mobility  Bed Mobility Overal bed mobility: Needs Assistance Bed Mobility: Supine to Sit;Sit to Supine Rolling: Min guard Sidelying to sit: Min guard Supine to sit: Min guard Sit to supine: Min guard   General bed mobility comments: min guard for safety, increased time required to complete and use of bed rails.    Transfers Overall transfer level: Needs assistance Equipment used: Rolling walker (2 wheels) Transfers: Sit to/from Stand Sit to Stand: Min guard to min assist for balance.            General transfer comment: min guard for safety. Constant mod cues for hand placement.  Obtained orthostatic VS as below    Ambulation/Gait                  Stairs            Wheelchair Mobility    Modified Rankin (Stroke Patients Only)       Balance Overall balance assessment: Needs assistance Sitting-balance support: No upper extremity supported;Feet supported Sitting balance-Leahy Scale: Fair     Standing balance support: Bilateral upper extremity supported;During functional activity;Reliant on assistive device for balance Standing balance-Leahy Scale: Poor Standing balance comment: reliant on UE support  Pertinent Vitals/Pain Pain Assessment: Faces Faces Pain Scale: Hurts little more Pain Location: back Pain Descriptors / Indicators: Discomfort;Grimacing Pain Intervention(s): Limited activity within patient's tolerance;Monitored during session;Repositioned    Home Living Family/patient expects to be discharged to::  Skilled nursing facility Living Arrangements: Alone Available Help at Discharge: Family;Available PRN/intermittently Type of Home: House Home Access: Stairs to enter Entrance Stairs-Rails: Left Entrance Stairs-Number of Steps: 6   Home Layout: One level Home Equipment: Standard Walker;Shower seat      Prior Function Prior Level of Function : History of Falls (last six months);Needs assist       Physical Assist : Mobility (physical);ADLs (physical) Mobility (physical): Gait ADLs (physical): Bathing;Dressing;IADLs Mobility Comments: used standard walker at home prior to recent admit. Son reported 3-4 falls in past months.  Pt was using RW at facility short distances with min assist. ADLs Comments: Patient reports requiring assist for bathing and dressing from daughter in law when she was at home and facility has been providing assist recently.     Hand Dominance   Dominant Hand: Right    Extremity/Trunk Assessment   Upper Extremity Assessment Upper Extremity Assessment: Defer to OT evaluation    Lower Extremity Assessment Lower Extremity Assessment: Generalized weakness    Cervical / Trunk Assessment Cervical / Trunk Assessment: Kyphotic  Communication   Communication: No difficulties  Cognition Arousal/Alertness: Awake/alert Behavior During Therapy: WFL for tasks assessed/performed Overall Cognitive Status: Impaired/Different from baseline Area of Impairment: Memory;Awareness;Safety/judgement                 Orientation Level: Disoriented to;Time;Situation   Memory: Decreased recall of precautions;Decreased short-term memory   Safety/Judgement: Decreased awareness of safety Awareness: Emergent            General Comments General comments (skin integrity, edema, etc.): 63 bpm, 131/89 supine; 68 bpm, 150/90 sitting; 65 bpm, 135/125 standing; 68 bpm, 120/105 standing after 3 min    Exercises General Exercises - Lower Extremity Ankle Circles/Pumps:  AROM;Both;10 reps;Seated Long Arc Quad: AROM;Both;10 reps;Seated   Assessment/Plan    PT Assessment Patient needs continued PT services  PT Problem List Decreased strength;Decreased activity tolerance;Decreased balance;Decreased mobility;Decreased cognition;Decreased knowledge of use of DME;Decreased safety awareness;Decreased knowledge of precautions       PT Treatment Interventions DME instruction;Gait training;Stair training;Functional mobility training;Therapeutic activities;Balance training;Therapeutic exercise;Patient/family education    PT Goals (Current goals can be found in the Care Plan section)  Acute Rehab PT Goals Patient Stated Goal: to get better PT Goal Formulation: With patient Time For Goal Achievement: 04/22/21 Potential to Achieve Goals: Fair    Frequency Min 2X/week   Barriers to discharge        Co-evaluation               AM-PAC PT "6 Clicks" Mobility  Outcome Measure Help needed turning from your back to your side while in a flat bed without using bedrails?: A Little Help needed moving from lying on your back to sitting on the side of a flat bed without using bedrails?: A Little Help needed moving to and from a bed to a chair (including a wheelchair)?: A Lot Help needed standing up from a chair using your arms (e.g., wheelchair or bedside chair)?: A Lot Help needed to walk in hospital room?: A Lot Help needed climbing 3-5 steps with a railing? : Total 6 Click Score: 13    End of Session Equipment Utilized During Treatment: Gait belt Activity Tolerance: Patient tolerated treatment well Patient left: in  bed;with call bell/phone within reach;with bed alarm set Nurse Communication: Mobility status PT Visit Diagnosis: Unsteadiness on feet (R26.81);Muscle weakness (generalized) (M62.81);History of falling (Z91.81)    Time: 4373-5789 PT Time Calculation (min) (ACUTE ONLY): 27 min   Charges:   PT Evaluation $PT Eval Moderate Complexity: 1  Mod PT Treatments $Therapeutic Activity: 8-22 mins        Konstantina Nachreiner M,PT Acute Rehab Services [phone] (pager)   Alvira Philips 04/08/2021, 12:28 PM

## 2021-04-08 NOTE — Progress Notes (Signed)
PROGRESS NOTE    [name] Small  KGM:[id number] DOB: 08-07-1928 DOA: 04/06/2021 PCP: Billie Ruddy, MD   Chief Complaint  Patient presents with   Loss of Consciousness   Brief Narrative/Hospital Course:  [name] Small, 87 y.o. female with PMH of  atrial fibrillation on Xarelto, sick sinus syndrome with pacer, mild renal insufficiency, hypertension, and vertebral compression fracture status post vertebroplasty on 03/21/2021 presented from the skilled nursing facility with loss of appetite malaise fatigue and cough since 11/13 tested positive for COVID-19 at the facility and also had syncopal episode 11/15 at the facility witnessed by the staff while she was using the restroom.. In the ED afebrile first-degree block on the EKG RBBB incomplete, LAFB, chest x-ray negative for acute finding, CT head CT cervical spine no acute finding COVID-19 PCR is still positive with cycle threshold 22.3.  Patient was given IV fluids remdesivir and admitted.   Subjective: Seen and examined this morning.  Patient complains of chest pain under the right chest blade , also has cough.  Not hypoxic.  No fever.  She vomited during orthostatic yesterday but could not be continued.  Feels very weak and deconditioned, has poor appetite  Assessment & Plan:  COVID-19 infection: Symptomatic with generalized weakness poor intake, also with chest pain, chest x-ray clear not hypoxic.  Obtain CT chest without contrast.  Cont on remdesivir given high risk. Monitor inflammatory markers.   Syncope  Orthostatic Hypotension in the setting of decreased oral intake in the setting of COVID-19 infection: Multifactorial, orthostatic pending unable to be completed due to vomiting. TTE-EF 60 to 65%, moderate concentric LVH G2 DD normal mitral valve and aortic valve with mild calcification.  Gentle IV fluids as orthostatic positive.  Abnormal UA/pyuria but no fever ,procalcitonin- is normal-holding off on antibiotics pending urine culture.    Debility /deconditioning/generalized weakness: PTOT eval. supportive  Essential hypertension-accelerated hypertension blood pressure stable this morning.  Continue losartan and metoprolol.  Atrial fibrillation with RVR-CHADs VASc=4, rate controlled.  Continue amiodarone, metoprolol and Xarelto.    CKD stage IIIa function is stable.  Recent Labs  Lab 04/06/21 1514 04/07/21 0244 04/08/21 0300  BUN 15 11 15   CREATININE 0.94 0.87 0.90    Nausea vomiting: Continue gentle fluid hydration supportive.  DVT prophylaxis:  Code Status:   Code Status: Full Code Family Communication: plan of care discussed with patient at bedside. Status is: Admitted as observation Remains hospitalized for ongoing management of severe generalized weakness low oral intake, orthostatic hypotension, COVID-19 infection and syncope now with nausea vomiting. Disposition: Currently not medically stable for discharge. Anticipated Disposition: TBD.  PT OT pending for disposition   Objective: Vitals last 24 hrs: Vitals:   04/08/21 0736 04/08/21 0737 04/08/21 0739 04/08/21 0740  BP: (!) 183/68 (!) 152/73 (!) 163/64 132/60  Pulse:    63  Resp: (!) 22 (!) 23  20  Temp:    98.4 F (36.9 C)  TempSrc:    Tympanic  SpO2: 98% 98%  98%  Weight:      Height:       Weight change:   Intake/Output Summary (Last 24 hours) at 04/08/2021 0942 Last data filed at 04/07/2021 1800 Gross per 24 hour  Intake 1100 ml  Output --  Net 1100 ml    Net IO Since Admission: 2,100 mL [04/08/21 0942]   Physical Examination: General exam: AAOx 3, appears uncomfortable due to pain.  Older than stated age, weak appearing. HEENT:Oral mucosa moist, Ear/Nose WNL grossly,  dentition normal. Respiratory system: bilaterally diminished, tender chest wall on the right, no use of accessory muscle Cardiovascular system: S1 & S2 +, No JVD,. Gastrointestinal system: Abdomen soft, NT,ND, BS+ Nervous System:Alert, awake, moving extremities  and grossly nonfocal Extremities: No edema, distal peripheral pulses palpable.  Skin: No rashes,no icterus. MSK: Normal muscle bulk,tone, power   Medications reviewed:  Scheduled Meds:  amiodarone  200 mg Oral Q lunch   atorvastatin  40 mg Oral Q lunch   docusate sodium  100 mg Oral BID   losartan  50 mg Oral Daily   metoprolol tartrate  25 mg Oral BID   polyvinyl alcohol  1 drop Both Eyes Daily   potassium chloride  20 mEq Oral Daily   Rivaroxaban  15 mg Oral Q lunch   sodium chloride flush  3 mL Intravenous Q12H   Continuous Infusions:  lactated ringers 75 mL/hr at 04/08/21 9381    Diet Order             Diet Heart Room service appropriate? Yes; Fluid consistency: Thin  Diet effective now                          Weight change:   Wt Readings from Last 3 Encounters:  04/08/21 49.6 kg  03/16/21 54.4 kg  03/09/21 54.4 kg     Consultants:see note  Procedures:see note Antimicrobials: Anti-infectives (From admission, onward)    Start     Dose/Rate Route Frequency Ordered Stop   04/07/21 1000  remdesivir 100 mg in sodium chloride 0.9 % 100 mL IVPB       See Hyperspace for full Linked Orders Report.   100 mg 200 mL/hr over 30 Minutes Intravenous Daily 04/06/21 2124 04/08/21 0928   04/06/21 2130  remdesivir 200 mg in sodium chloride 0.9% 250 mL IVPB       See Hyperspace for full Linked Orders Report.   200 mg 580 mL/hr over 30 Minutes Intravenous Once 04/06/21 2124 04/06/21 2357      Culture/Microbiology    Component Value Date/Time   SDES URINE, RANDOM 12/07/2014 1041   SPECREQUEST Normal 12/07/2014 1041   CULT  12/07/2014 1041    MULTIPLE SPECIES PRESENT, SUGGEST RECOLLECTION IF CLINICALLY INDICATED   REPTSTATUS 12/09/2014 FINAL 12/07/2014 1041    Other culture-see note  Unresulted Labs (From admission, onward)     Start     Ordered   04/08/21 0500  Procalcitonin  Daily,   R      04/07/21 0858   04/08/21 0500  Comprehensive metabolic panel   Daily,   R      04/07/21 0859   04/08/21 0500  CBC  Daily,   R      04/07/21 0859   04/07/21 0904  Urine Culture  Add-on,   AD       Question:  Indication  Answer:  Dysuria   04/07/21 0903   04/06/21 1514  CBC WITH DIFFERENTIAL  ONCE - STAT,   STAT        04/06/21 1514          Data Reviewed: I have personally reviewed following labs and imaging studies CBC: Recent Labs  Lab 04/06/21 1840 04/07/21 0244 04/08/21 0300  WBC 6.0 4.5 6.0  NEUTROABS 4.4  --   --   HGB 13.0 12.5 13.1  HCT 40.6 38.1 39.2  MCV 94.2 94.3 91.2  PLT 142* 150 829    Basic Metabolic Panel:  Recent Labs  Lab 04/06/21 1514 04/07/21 0244 04/08/21 0300  NA 138 140 137  K 3.6 3.5 3.0*  CL 100 104 103  CO2 27 25 22   GLUCOSE 136* 89 104*  BUN 15 11 15   CREATININE 0.94 0.87 0.90  CALCIUM 8.9 8.4* 8.6*  MG  --  1.9  --     GFR: Estimated Creatinine Clearance: 32.5 mL/min (by C-G formula based on SCr of 0.9 mg/dL). Liver Function Tests: Recent Labs  Lab 04/06/21 2221 04/07/21 0244 04/08/21 0300  AST 38 36 37  ALT 34 32 32  ALKPHOS 88 84 83  BILITOT 0.9 1.1 1.1  PROT 6.1* 6.1* 6.0*  ALBUMIN 3.0* 2.9* 2.9*    No results for input(s): LIPASE, AMYLASE in the last 168 hours. No results for input(s): AMMONIA in the last 168 hours. Coagulation Profile: No results for input(s): INR, PROTIME in the last 168 hours. Cardiac Enzymes: No results for input(s): CKTOTAL, CKMB, CKMBINDEX, TROPONINI in the last 168 hours. BNP (last 3 results) No results for input(s): PROBNP in the last 8760 hours. HbA1C: No results for input(s): HGBA1C in the last 72 hours. CBG: Recent Labs  Lab 04/06/21 1616 04/07/21 0614  GLUCAP 131* 81    Lipid Profile: No results for input(s): CHOL, HDL, LDLCALC, TRIG, CHOLHDL, LDLDIRECT in the last 72 hours. Thyroid Function Tests: No results for input(s): TSH, T4TOTAL, FREET4, T3FREE, THYROIDAB in the last 72 hours. Anemia Panel: Recent Labs    04/06/21 2221   FERRITIN 418*    Sepsis Labs: Recent Labs  Lab 04/06/21 1534 04/06/21 1800 04/06/21 2221 04/07/21 1859 04/08/21 0300  PROCALCITON  --   --  <0.10 <0.10 <0.10  LATICACIDVEN 1.6 1.5  --   --   --      Recent Results (from the past 240 hour(s))  Resp Panel by RT-PCR (Flu A&B, Covid) Nasopharyngeal Swab     Status: Abnormal   Collection Time: 04/06/21  3:14 PM   Specimen: Nasopharyngeal Swab; Nasopharyngeal(NP) swabs in vial transport medium  Result Value Ref Range Status   SARS Coronavirus 2 by RT PCR POSITIVE (A) NEGATIVE Final    Comment: RESULT CALLED TO, READ BACK BY AND VERIFIED WITH: CHRIS CRISCOE 04/06/2021 @2044  BY JW (NOTE) SARS-CoV-2 target nucleic acids are DETECTED.  The SARS-CoV-2 RNA is generally detectable in upper respiratory specimens during the acute phase of infection. Positive results are indicative of the presence of the identified virus, but do not rule out bacterial infection or co-infection with other pathogens not detected by the test. Clinical correlation with patient history and other diagnostic information is necessary to determine patient infection status. The expected result is Negative.  Fact Sheet for Patients: EntrepreneurPulse.com.au  Fact Sheet for Healthcare Providers: IncredibleEmployment.be  This test is not yet approved or cleared by the [place] FDA and  has been authorized for detection and/or diagnosis of SARS-CoV-2 by FDA under an Emergency Use Authorization (EUA).  This EUA will remain in effect (meaning this test can  be used) for the duration of  the COVID-19 declaration under Section 564(b)(1) of the Act, 21 U.S.C. section 360bbb-3(b)(1), unless the authorization is terminated or revoked sooner.     Influenza A by PCR NEGATIVE NEGATIVE Final   Influenza B by PCR NEGATIVE NEGATIVE Final    Comment: (NOTE) The Xpert Xpress SARS-CoV-2/FLU/RSV plus assay is intended as an aid in the  diagnosis of influenza from Nasopharyngeal swab specimens and should not be used as a sole basis  for treatment. Nasal washings and aspirates are unacceptable for Xpert Xpress SARS-CoV-2/FLU/RSV testing.  Fact Sheet for Patients: EntrepreneurPulse.com.au  Fact Sheet for Healthcare Providers: IncredibleEmployment.be  This test is not yet approved or cleared by the [place] FDA and has been authorized for detection and/or diagnosis of SARS-CoV-2 by FDA under an Emergency Use Authorization (EUA). This EUA will remain in effect (meaning this test can be used) for the duration of the COVID-19 declaration under Section 564(b)(1) of the Act, 21 U.S.C. section 360bbb-3(b)(1), unless the authorization is terminated or revoked.  Performed at [facility], [place] [address] [place], [place] [address]       Radiology Studies: CT Head Wo Contrast  Result Date: 04/06/2021 CLINICAL DATA:  Syncope. EXAM: CT HEAD WITHOUT CONTRAST TECHNIQUE: Contiguous axial images were obtained from the base of the skull through the vertex without intravenous contrast. COMPARISON:  03/07/2021 FINDINGS: Brain: There is atrophy and chronic small vessel disease changes. No acute intracranial abnormality. Specifically, no hemorrhage, hydrocephalus, mass lesion, acute infarction, or significant intracranial injury. Vascular: No hyperdense vessel or unexpected calcification. Skull: No acute calvarial abnormality. Sinuses/Orbits: No acute findings Other: None IMPRESSION: Atrophy, chronic microvascular disease. No acute intracranial abnormality. Electronically Signed   By: Rolm Baptise M.D.   On: 04/06/2021 17:17   CT Cervical Spine Wo Contrast  Result Date: 04/06/2021 CLINICAL DATA:  Syncope EXAM: CT CERVICAL SPINE WITHOUT CONTRAST TECHNIQUE: Multidetector CT imaging of the cervical spine was performed without intravenous contrast. Multiplanar CT image reconstructions were also  generated. COMPARISON:  None. FINDINGS: Alignment: Normal Skull base and vertebrae: No acute fracture. No primary bone lesion or focal pathologic process. Soft tissues and spinal canal: No prevertebral fluid or swelling. No visible canal hematoma. Disc levels:  Maintained Upper chest: Biapical scarring. Other: None IMPRESSION: No acute bony abnormality. Electronically Signed   By: Rolm Baptise M.D.   On: 04/06/2021 17:18   DG Chest Port 1 View  Result Date: 04/06/2021 CLINICAL DATA:  Cough, COVID EXAM: PORTABLE CHEST 1 VIEW COMPARISON:  11/26/2015 FINDINGS: Left-sided implanted cardiac device with stable positioning of leads. Heart size within normal limits. Atherosclerotic calcification of the aortic knob. No focal airspace consolidation, pleural effusion, or pneumothorax. No acute bony findings. IMPRESSION: No acute cardiopulmonary findings. Electronically Signed   By: Davina Poke D.O.   On: 04/06/2021 15:42   ECHOCARDIOGRAM COMPLETE  Result Date: 04/07/2021    ECHOCARDIOGRAM REPORT   Patient Name:   [name] Small Date of Exam: 04/07/2021 Medical Rec #:  [id number]   Height:       65.0 in Accession #:    [id number]  Weight:       119.9 lb Date of Birth:  19-Jan-1929    BSA:          1.592 m Patient Age:    48 years    BP:           171/64 mmHg Patient Gender: F           HR:           64 bpm. Exam Location:  Inpatient Procedure: 2D Echo, Cardiac Doppler and Color Doppler Indications:    Syncope  History:        Patient has prior history of Echocardiogram examinations, most                 recent 10/03/2011. Pacemaker; Risk Factors:Hypertension.  Sonographer:    Helmut Muster Referring Phys: [id number] [place]  Sonographer Comments: Condition. IMPRESSIONS  1. Left ventricular ejection fraction, by estimation, is 60 to 65%. The left ventricle has normal function. The left ventricle has no regional wall motion abnormalities. There is moderate concentric left ventricular hypertrophy. Left ventricular  diastolic parameters are consistent with Grade II diastolic dysfunction (pseudonormalization). Elevated left ventricular end-diastolic pressure.  2. Right ventricular systolic function is normal. The right ventricular size is normal. There is normal pulmonary artery systolic pressure. The estimated right ventricular systolic pressure is 16.1 mmHg.  3. Left atrial size was moderately dilated.  4. The mitral valve is normal in structure. Trivial mitral valve regurgitation. No evidence of mitral stenosis.  5. The aortic valve is tricuspid. There is mild calcification of the aortic valve. There is mild thickening of the aortic valve. Aortic valve regurgitation is trivial. Aortic valve sclerosis is present, with no evidence of aortic valve stenosis.  6. The inferior vena cava is dilated in size with >50% respiratory variability, suggesting right atrial pressure of 8 mmHg. Comparison(s): No significant change from prior study. Conclusion(s)/Recommendation(s): Otherwise normal echocardiogram, with minor abnormalities described in the report. FINDINGS  Left Ventricle: Left ventricular ejection fraction, by estimation, is 60 to 65%. The left ventricle has normal function. The left ventricle has no regional wall motion abnormalities. The left ventricular internal cavity size was small. There is moderate  concentric left ventricular hypertrophy. Left ventricular diastolic parameters are consistent with Grade II diastolic dysfunction (pseudonormalization). Elevated left ventricular end-diastolic pressure. Right Ventricle: The right ventricular size is normal. No increase in right ventricular wall thickness. Right ventricular systolic function is normal. There is normal pulmonary artery systolic pressure. The tricuspid regurgitant velocity is 2.46 m/s, and  with an assumed right atrial pressure of 8 mmHg, the estimated right ventricular systolic pressure is 09.6 mmHg. Left Atrium: Left atrial size was moderately dilated. Right  Atrium: Right atrial size was normal in size. Pericardium: Trivial pericardial effusion is present. Mitral Valve: The mitral valve is normal in structure. Trivial mitral valve regurgitation. No evidence of mitral valve stenosis. Tricuspid Valve: The tricuspid valve is normal in structure. Tricuspid valve regurgitation is trivial. No evidence of tricuspid stenosis. Aortic Valve: The aortic valve is tricuspid. There is mild calcification of the aortic valve. There is mild thickening of the aortic valve. Aortic valve regurgitation is trivial. Aortic valve sclerosis is present, with no evidence of aortic valve stenosis. Pulmonic Valve: The pulmonic valve was not well visualized. Pulmonic valve regurgitation is not visualized. No evidence of pulmonic stenosis. Aorta: The aortic root, ascending aorta and aortic arch are all structurally normal, with no evidence of dilitation or obstruction. Venous: The inferior vena cava is dilated in size with greater than 50% respiratory variability, suggesting right atrial pressure of 8 mmHg. IAS/Shunts: The atrial septum is grossly normal. Additional Comments: A device lead is visualized.  LEFT VENTRICLE PLAX 2D LVIDd:         3.10 cm     Diastology LVIDs:         2.20 cm     LV e' medial:    3.69 cm/s LV PW:         1.40 cm     LV E/e' medial:  25.0 LV IVS:        1.40 cm     LV e' lateral:   6.06 cm/s LVOT diam:     1.65 cm     LV E/e' lateral: 15.2 LV SV:         48  LV SV Index:   30 LVOT Area:     2.14 cm  LV Volumes (MOD) LV vol d, MOD A2C: 23.5 ml LV vol d, MOD A4C: 24.5 ml LV vol s, MOD A2C: 8.7 ml LV vol s, MOD A4C: 9.9 ml LV SV MOD A2C:     14.8 ml LV SV MOD A4C:     24.5 ml LV SV MOD BP:      14.4 ml RIGHT VENTRICLE            IVC RV S prime:     9.37 cm/s  IVC diam: 2.10 cm TAPSE (M-mode): 2.5 cm LEFT ATRIUM             Index        RIGHT ATRIUM           Index LA diam:        2.40 cm 1.51 cm/m   RA Area:     13.90 cm LA Vol (A2C):   67.1 ml 42.16 ml/m  RA Volume:    31.50 ml  19.79 ml/m LA Vol (A4C):   36.9 ml 23.18 ml/m LA Biplane Vol: 54.7 ml 34.37 ml/m  AORTIC VALVE LVOT Vmax:   93.80 cm/s LVOT Vmean:  65.200 cm/s LVOT VTI:    0.224 m  AORTA Ao Root diam: 3.00 cm Ao Asc diam:  2.90 cm MITRAL VALVE               TRICUSPID VALVE MV Area (PHT): 2.80 cm    TR Peak grad:   24.2 mmHg MV Decel Time: 271 msec    TR Vmax:        246.00 cm/s MV E velocity: 92.20 cm/s MV A velocity: 71.80 cm/s  SHUNTS MV E/A ratio:  1.28        Systemic VTI:  0.22 m                            Systemic Diam: 1.65 cm Buford Dresser MD Electronically signed by Buford Dresser MD Signature Date/Time: 04/07/2021/2:27:22 PM    Final      LOS: 0 days   Antonieta Pert, MD [facility]  04/08/2021, 9:42 AM

## 2021-04-09 DIAGNOSIS — R55 Syncope and collapse: Secondary | ICD-10-CM | POA: Diagnosis not present

## 2021-04-09 LAB — COMPREHENSIVE METABOLIC PANEL
ALT: 35 U/L (ref 0–44)
AST: 43 U/L — ABNORMAL HIGH (ref 15–41)
Albumin: 3 g/dL — ABNORMAL LOW (ref 3.5–5.0)
Alkaline Phosphatase: 91 U/L (ref 38–126)
Anion gap: 12 (ref 5–15)
BUN: 17 mg/dL (ref 8–23)
CO2: 21 mmol/L — ABNORMAL LOW (ref 22–32)
Calcium: 8.9 mg/dL (ref 8.9–10.3)
Chloride: 104 mmol/L (ref 98–111)
Creatinine, Ser: 0.88 mg/dL (ref 0.44–1.00)
GFR, Estimated: >60 mL/min (ref >60)
Glucose, Bld: 127 mg/dL — ABNORMAL HIGH (ref 70–99)
Potassium: 3.3 mmol/L — ABNORMAL LOW (ref 3.5–5.1)
Sodium: 137 mmol/L (ref 135–145)
Total Bilirubin: 1.4 mg/dL — ABNORMAL HIGH (ref 0.3–1.2)
Total Protein: 6 g/dL — ABNORMAL LOW (ref 6.5–8.1)

## 2021-04-09 LAB — CBC
HCT: 39.7 % (ref 36.0–46.0)
Hemoglobin: 13.3 g/dL (ref 12.0–15.0)
MCH: 30.3 pg (ref 26.0–34.0)
MCHC: 33.5 g/dL (ref 30.0–36.0)
MCV: 90.4 fL (ref 80.0–100.0)
Platelets: 221 10*3/uL (ref 150–400)
RBC: 4.39 MIL/uL (ref 3.87–5.11)
RDW: 13.3 % (ref 11.5–15.5)
WBC: 6.4 10*3/uL (ref 4.0–10.5)
nRBC: 0 % (ref 0.0–0.2)

## 2021-04-09 LAB — GLUCOSE, CAPILLARY: Glucose-Capillary: 128 mg/dL — ABNORMAL HIGH (ref 70–99)

## 2021-04-09 LAB — URINE CULTURE: Culture: >=100000 — AB

## 2021-04-09 LAB — PROCALCITONIN: Procalcitonin: <0.1 ng/mL

## 2021-04-09 MED ORDER — POTASSIUM CHLORIDE 20 MEQ PO PACK
40.0000 meq | PACK | Freq: Once | ORAL | Status: AC
  Administered 2021-04-09: 40 meq via ORAL
  Filled 2021-04-09: qty 2

## 2021-04-09 MED ORDER — SODIUM CHLORIDE 0.9 % IV SOLN
2.0000 g | INTRAVENOUS | Status: AC
  Administered 2021-04-10 – 2021-04-11 (×2): 2 g via INTRAVENOUS
  Filled 2021-04-09 (×2): qty 20

## 2021-04-09 MED ORDER — PANTOPRAZOLE SODIUM 40 MG IV SOLR
40.0000 mg | INTRAVENOUS | Status: DC
  Administered 2021-04-09 – 2021-04-11 (×3): 40 mg via INTRAVENOUS
  Filled 2021-04-09 (×3): qty 40

## 2021-04-09 MED ORDER — SODIUM CHLORIDE 0.9 % IV SOLN: INTRAVENOUS | Status: DC

## 2021-04-09 MED ORDER — ALUM & MAG HYDROXIDE-SIMETH 200-200-20 MG/5ML PO SUSP: 15.0000 mL | Freq: Four times a day (QID) | ORAL | Status: DC | PRN

## 2021-04-09 MED ORDER — SODIUM CHLORIDE 0.9 % IV SOLN
1.0000 g | INTRAVENOUS | Status: DC
  Administered 2021-04-09: 1 g via INTRAVENOUS
  Filled 2021-04-09: qty 10

## 2021-04-09 NOTE — Plan of Care (Signed)
  Problem: Pain Managment: Goal: General experience of comfort will improve Outcome: Completed/Met

## 2021-04-09 NOTE — Progress Notes (Signed)
PROGRESS NOTE    [name] Small  PTW:[id number] DOB: 11-26-1928 DOA: 04/06/2021 PCP: Billie Ruddy, MD   Chief Complaint  Patient presents with   Loss of Consciousness   Brief Narrative/Hospital Course:  [name] Small, 87 y.o. female with PMH of  atrial fibrillation on Xarelto, sick sinus syndrome with pacer, mild renal insufficiency, hypertension, and vertebral compression fracture status post vertebroplasty on 03/21/2021 presented from the skilled nursing facility with loss of appetite malaise fatigue and cough since 11/13 tested positive for COVID-19 at the facility and also had syncopal episode 11/15 at the facility witnessed by the staff while she was using the restroom.. In the ED afebrile first-degree block on the EKG RBBB incomplete, LAFB, chest x-ray negative for acute finding, CT head CT cervical spine no acute finding COVID-19 PCR is still positive with cycle threshold 22.3.  Patient was given IV fluids remdesivir and admitted. She had been orthostatic needing IV fluid hydration.   Subjective: Continues to have poor oral intake needing hydration.  Also having nausea. Denies chest pain.  Having cough.  Not needing oxygen.  Assessment & Plan:  COVID-19 infection: Symptomatic with generalized weakness poor intake, also with chest pain, chest x-ray clear not hypoxic. CT chest without contrast-interstitial lung disease, atelectasis mild bronchial thickening suggesting bronchitis.  Completed remdesivir.  Add antibiotic for bronchitis management.  Continue bronchodilators  Syncope  Orthostatic Hypotension in the setting of decreased oral intake from COVID-19 infection: Continue IV fluid hydration.  Again having nausea this morning add PPI.TTE-EF 60 to 65%, moderate concentric LVH G2 DD normal mitral valve and aortic valve with mild calcification.   Abnormal UA/pyuria but no fever ,procalcitonin- is normal-holding off on antibiotics pending urine culture.   Debility  /deconditioning/generalized weakness: PTOT eval. supportive  Essential hypertension-accelerated hypertension blood pressure stable this morning.  Continue losartan and metoprolol.  Atrial fibrillation with RVR-CHADs VASc=4, rate controlled on amiodarone and metoprolol and anticoagulated on Xarelto   CKD stage IIIa function is stable.  Recent Labs  Lab 04/06/21 1514 04/07/21 0244 04/08/21 0300 04/09/21 0231  BUN 15 11 15 17   CREATININE 0.94 0.87 0.90 0.88    Nausea vomiting: Still nauseous, add PPI, Maalox.  Continue Zofran.  Continue hydration.    DVT prophylaxis:  Code Status:   Code Status: Full Code Family Communication: plan of care discussed with patient at bedside. Status is: inpatient Remains hospitalized for ongoing management of severe generalized weakness low oral intake, orthostatic hypotension, COVID-19 infection and syncope -ongoing nausea at risk of dehydration needing IV fluid hydration.  Disposition: Currently not medically stable for discharge. Anticipated Disposition: SNF likely next 24 hours    Objective: Vitals last 24 hrs: Vitals:   04/08/21 2030 04/09/21 0015 04/09/21 0400 04/09/21 0815  BP: (!) 155/50 (!) 178/71 130/80 (!) 130/56  Pulse: 68 60 60 63  Resp: 18 18 17 18   Temp: 98.4 F (36.9 C)  97.8 F (36.6 C) (!) 97.5 F (36.4 C)  TempSrc: Oral  Oral Oral  SpO2: 99% 100% 99% 100%  Weight:   47.3 kg   Height:       Weight change: -2.3 kg  Intake/Output Summary (Last 24 hours) at 04/09/2021 0908 Last data filed at 04/08/2021 1800 Gross per 24 hour  Intake 571.36 ml  Output --  Net 571.36 ml    Net IO Since Admission: 2,671.36 mL [04/09/21 0908]   Physical Examination: General exam: AAOx 3, elderly, older than stated age, weak appearing. HEENT:Oral mucosa moist, Ear/Nose  WNL grossly, dentition normal. Respiratory system: bilaterally conducted breath sounds with crackles at the base, no use of accessory muscle Cardiovascular system: S1 &  S2 +, No JVD,. Gastrointestinal system: Abdomen soft,NT,ND, BS+ Nervous System:Alert, awake, moving extremities and grossly nonfocal Extremities: no edema, distal peripheral pulses palpable.  Skin: No rashes,no icterus. MSK: Normal muscle bulk,tone, power   Medications reviewed:  Scheduled Meds:  amiodarone  200 mg Oral Q lunch   atorvastatin  40 mg Oral Q lunch   docusate sodium  100 mg Oral BID   losartan  50 mg Oral Daily   metoprolol tartrate  25 mg Oral BID   pantoprazole (PROTONIX) IV  40 mg Intravenous Q24H   polyvinyl alcohol  1 drop Both Eyes Daily   potassium chloride  40 mEq Oral Once   Rivaroxaban  15 mg Oral Q lunch   sodium chloride flush  3 mL Intravenous Q12H   Continuous Infusions:  lactated ringers 75 mL/hr at 04/09/21 0532    Diet Order             Diet Heart Room service appropriate? Yes; Fluid consistency: Thin  Diet effective now                          Weight change: -2.3 kg  Wt Readings from Last 3 Encounters:  04/09/21 47.3 kg  03/16/21 54.4 kg  03/09/21 54.4 kg     Consultants:see note  Procedures:see note Antimicrobials: Anti-infectives (From admission, onward)    Start     Dose/Rate Route Frequency Ordered Stop   04/07/21 1000  remdesivir 100 mg in sodium chloride 0.9 % 100 mL IVPB       See Hyperspace for full Linked Orders Report.   100 mg 200 mL/hr over 30 Minutes Intravenous Daily 04/06/21 2124 04/08/21 1028   04/06/21 2130  remdesivir 200 mg in sodium chloride 0.9% 250 mL IVPB       See Hyperspace for full Linked Orders Report.   200 mg 580 mL/hr over 30 Minutes Intravenous Once 04/06/21 2124 04/06/21 2357      Culture/Microbiology    Component Value Date/Time   SDES URINE, CLEAN CATCH 04/07/2021 0904   SPECREQUEST NONE 04/07/2021 0904   CULT (A) 04/07/2021 0904    >=100,000 COLONIES/mL ESCHERICHIA COLI SUSCEPTIBILITIES TO FOLLOW CULTURE REINCUBATED FOR BETTER GROWTH Performed at [facility], [place]  [address], [place], [place] [address]    REPTSTATUS PENDING 04/07/2021 7846    Other culture-see note  Unresulted Labs (From admission, onward)     Start     Ordered   04/08/21 0500  Comprehensive metabolic panel  Daily,   R      04/07/21 0859   04/08/21 0500  CBC  Daily,   R      04/07/21 0859   04/06/21 1514  CBC WITH DIFFERENTIAL  ONCE - STAT,   STAT        04/06/21 1514          Data Reviewed: I have personally reviewed following labs and imaging studies CBC: Recent Labs  Lab 04/06/21 1840 04/07/21 0244 04/08/21 0300 04/09/21 0231  WBC 6.0 4.5 6.0 6.4  NEUTROABS 4.4  --   --   --   HGB 13.0 12.5 13.1 13.3  HCT 40.6 38.1 39.2 39.7  MCV 94.2 94.3 91.2 90.4  PLT 142* 150 199 962    Basic Metabolic Panel: Recent Labs  Lab 04/06/21 1514  04/07/21 0244 04/08/21 0300 04/09/21 0231  NA 138 140 137 137  K 3.6 3.5 3.0* 3.3*  CL 100 104 103 104  CO2 27 25 22  21*  GLUCOSE 136* 89 104* 127*  BUN 15 11 15 17   CREATININE 0.94 0.87 0.90 0.88  CALCIUM 8.9 8.4* 8.6* 8.9  MG  --  1.9  --   --     GFR: Estimated Creatinine Clearance: 31.7 mL/min (by C-G formula based on SCr of 0.88 mg/dL). Liver Function Tests: Recent Labs  Lab 04/06/21 2221 04/07/21 0244 04/08/21 0300 04/09/21 0231  AST 38 36 37 43*  ALT 34 32 32 35  ALKPHOS 88 84 83 91  BILITOT 0.9 1.1 1.1 1.4*  PROT 6.1* 6.1* 6.0* 6.0*  ALBUMIN 3.0* 2.9* 2.9* 3.0*    No results for input(s): LIPASE, AMYLASE in the last 168 hours. No results for input(s): AMMONIA in the last 168 hours. Coagulation Profile: No results for input(s): INR, PROTIME in the last 168 hours. Cardiac Enzymes: No results for input(s): CKTOTAL, CKMB, CKMBINDEX, TROPONINI in the last 168 hours. BNP (last 3 results) No results for input(s): PROBNP in the last 8760 hours. HbA1C: No results for input(s): HGBA1C in the last 72 hours. CBG: Recent Labs  Lab 04/06/21 1616 04/07/21 0614 04/09/21 0533  GLUCAP 131* 81 128*    Lipid  Profile: No results for input(s): CHOL, HDL, LDLCALC, TRIG, CHOLHDL, LDLDIRECT in the last 72 hours. Thyroid Function Tests: No results for input(s): TSH, T4TOTAL, FREET4, T3FREE, THYROIDAB in the last 72 hours. Anemia Panel: Recent Labs    04/06/21 2221  FERRITIN 418*    Sepsis Labs: Recent Labs  Lab 04/06/21 1534 04/06/21 1800 04/06/21 2221 04/07/21 1859 04/08/21 0300 04/09/21 0231  PROCALCITON  --   --  <0.10 <0.10 <0.10 <0.10  LATICACIDVEN 1.6 1.5  --   --   --   --      Recent Results (from the past 240 hour(s))  Resp Panel by RT-PCR (Flu A&B, Covid) Nasopharyngeal Swab     Status: Abnormal   Collection Time: 04/06/21  3:14 PM   Specimen: Nasopharyngeal Swab; Nasopharyngeal(NP) swabs in vial transport medium  Result Value Ref Range Status   SARS Coronavirus 2 by RT PCR POSITIVE (A) NEGATIVE Final    Comment: RESULT CALLED TO, READ BACK BY AND VERIFIED WITH: CHRIS CRISCOE 04/06/2021 @2044  BY JW (NOTE) SARS-CoV-2 target nucleic acids are DETECTED.  The SARS-CoV-2 RNA is generally detectable in upper respiratory specimens during the acute phase of infection. Positive results are indicative of the presence of the identified virus, but do not rule out bacterial infection or co-infection with other pathogens not detected by the test. Clinical correlation with patient history and other diagnostic information is necessary to determine patient infection status. The expected result is Negative.  Fact Sheet for Patients: EntrepreneurPulse.com.au  Fact Sheet for Healthcare Providers: IncredibleEmployment.be  This test is not yet approved or cleared by the [place] FDA and  has been authorized for detection and/or diagnosis of SARS-CoV-2 by FDA under an Emergency Use Authorization (EUA).  This EUA will remain in effect (meaning this test can  be used) for the duration of  the COVID-19 declaration under Section 564(b)(1) of the Act,  21 U.S.C. section 360bbb-3(b)(1), unless the authorization is terminated or revoked sooner.     Influenza A by PCR NEGATIVE NEGATIVE Final   Influenza B by PCR NEGATIVE NEGATIVE Final    Comment: (NOTE) The Xpert Xpress SARS-CoV-2/FLU/RSV plus  assay is intended as an aid in the diagnosis of influenza from Nasopharyngeal swab specimens and should not be used as a sole basis for treatment. Nasal washings and aspirates are unacceptable for Xpert Xpress SARS-CoV-2/FLU/RSV testing.  Fact Sheet for Patients: EntrepreneurPulse.com.au  Fact Sheet for Healthcare Providers: IncredibleEmployment.be  This test is not yet approved or cleared by the [place] FDA and has been authorized for detection and/or diagnosis of SARS-CoV-2 by FDA under an Emergency Use Authorization (EUA). This EUA will remain in effect (meaning this test can be used) for the duration of the COVID-19 declaration under Section 564(b)(1) of the Act, 21 U.S.C. section 360bbb-3(b)(1), unless the authorization is terminated or revoked.  Performed at [facility], [place] [address], [place], [place] [address]   Urine Culture     Status: Abnormal (Preliminary result)   Collection Time: 04/07/21  9:04 AM   Specimen: Urine, Clean Catch  Result Value Ref Range Status   Specimen Description URINE, CLEAN CATCH  Final   Special Requests NONE  Final   Culture (A)  Final    >=100,000 COLONIES/mL ESCHERICHIA COLI SUSCEPTIBILITIES TO FOLLOW CULTURE REINCUBATED FOR BETTER GROWTH Performed at [facility], [place] [address] [place], [place] [address]    Report Status PENDING  Incomplete      Radiology Studies: CT CHEST WO CONTRAST  Result Date: 04/08/2021 CLINICAL DATA:  Cough.  COVID. EXAM: CT CHEST WITHOUT CONTRAST TECHNIQUE: Multidetector CT imaging of the chest was performed following the standard protocol without IV contrast. COMPARISON:  Chest x-ray April 06, 2021  FINDINGS: Cardiovascular: Calcified atherosclerosis is seen in the carotid arteries within the neck. Calcified atherosclerosis is seen in the thoracic aorta, primarily the arch and descending thoracic aorta. No aneurysm. Central pulmonary arteries are normal in caliber. Three-vessel coronary artery disease identified. The heart size is borderline. Mediastinum/Nodes: No pleural or pericardial effusions. The thyroid and esophagus are normal. No adenopathy. There is a 2 lead pacemaker. No other abnormalities in the chest wall. Lungs/Pleura: The central airways are normal. Mild bronchial wall thickening in the right lung base. No pneumothorax. No suspicious nodules or masses. No evidence of pneumonia. Mild dependent atelectasis identified. Atelectasis adjacent to the fissures. Mild peripheral reticular changes suggesting interstitial disease. Upper Abdomen: Possible pelvicaliectasis associated with the left kidney. Cyst in the right kidney. Previous cholecystectomy. Musculoskeletal: Vertebroplasty of a lower thoracic or upper lumbar vertebral body. IMPRESSION: 1. Mild peripheral reticular changes in the lungs consistent with interstitial lung disease, mild. Mild dependent atelectasis. 2. Mild bronchial wall thickening in the right base suggesting bronchitis. 3. Calcified atherosclerosis in the thoracic aorta and branching vessels. Three-vessel coronary artery disease. 4. Suggested mild pelvicaliectasis associated with the left kidney, age indeterminate. Aortic Atherosclerosis (ICD10-I70.0). Electronically Signed   By: Dorise Bullion III M.D.   On: 04/08/2021 13:54   ECHOCARDIOGRAM COMPLETE  Result Date: 04/07/2021    ECHOCARDIOGRAM REPORT   Patient Name:   [name] Small Date of Exam: 04/07/2021 Medical Rec #:  [id number]   Height:       65.0 in Accession #:    [id number]  Weight:       119.9 lb Date of Birth:  [birth year]    BSA:          1.592 m Patient Age:    69 years    BP:           171/64 mmHg Patient Gender: F  HR:           64 bpm. Exam Location:  Inpatient Procedure: 2D Echo, Cardiac Doppler and Color Doppler Indications:    Syncope  History:        Patient has prior history of Echocardiogram examinations, most                 recent 10/03/2011. Pacemaker; Risk Factors:Hypertension.  Sonographer:    Helmut Muster Referring Phys: [id number] TIMOTHY S OPYD  Sonographer Comments: Condition. IMPRESSIONS  1. Left ventricular ejection fraction, by estimation, is 60 to 65%. The left ventricle has normal function. The left ventricle has no regional wall motion abnormalities. There is moderate concentric left ventricular hypertrophy. Left ventricular diastolic parameters are consistent with Grade II diastolic dysfunction (pseudonormalization). Elevated left ventricular end-diastolic pressure.  2. Right ventricular systolic function is normal. The right ventricular size is normal. There is normal pulmonary artery systolic pressure. The estimated right ventricular systolic pressure is 74.1 mmHg.  3. Left atrial size was moderately dilated.  4. The mitral valve is normal in structure. Trivial mitral valve regurgitation. No evidence of mitral stenosis.  5. The aortic valve is tricuspid. There is mild calcification of the aortic valve. There is mild thickening of the aortic valve. Aortic valve regurgitation is trivial. Aortic valve sclerosis is present, with no evidence of aortic valve stenosis.  6. The inferior vena cava is dilated in size with >50% respiratory variability, suggesting right atrial pressure of 8 mmHg. Comparison(s): No significant change from prior study. Conclusion(s)/Recommendation(s): Otherwise normal echocardiogram, with minor abnormalities described in the report. FINDINGS  Left Ventricle: Left ventricular ejection fraction, by estimation, is 60 to 65%. The left ventricle has normal function. The left ventricle has no regional wall motion abnormalities. The left ventricular internal cavity size was small.  There is moderate  concentric left ventricular hypertrophy. Left ventricular diastolic parameters are consistent with Grade II diastolic dysfunction (pseudonormalization). Elevated left ventricular end-diastolic pressure. Right Ventricle: The right ventricular size is normal. No increase in right ventricular wall thickness. Right ventricular systolic function is normal. There is normal pulmonary artery systolic pressure. The tricuspid regurgitant velocity is 2.46 m/s, and  with an assumed right atrial pressure of 8 mmHg, the estimated right ventricular systolic pressure is 63.8 mmHg. Left Atrium: Left atrial size was moderately dilated. Right Atrium: Right atrial size was normal in size. Pericardium: Trivial pericardial effusion is present. Mitral Valve: The mitral valve is normal in structure. Trivial mitral valve regurgitation. No evidence of mitral valve stenosis. Tricuspid Valve: The tricuspid valve is normal in structure. Tricuspid valve regurgitation is trivial. No evidence of tricuspid stenosis. Aortic Valve: The aortic valve is tricuspid. There is mild calcification of the aortic valve. There is mild thickening of the aortic valve. Aortic valve regurgitation is trivial. Aortic valve sclerosis is present, with no evidence of aortic valve stenosis. Pulmonic Valve: The pulmonic valve was not well visualized. Pulmonic valve regurgitation is not visualized. No evidence of pulmonic stenosis. Aorta: The aortic root, ascending aorta and aortic arch are all structurally normal, with no evidence of dilitation or obstruction. Venous: The inferior vena cava is dilated in size with greater than 50% respiratory variability, suggesting right atrial pressure of 8 mmHg. IAS/Shunts: The atrial septum is grossly normal. Additional Comments: A device lead is visualized.  LEFT VENTRICLE PLAX 2D LVIDd:         3.10 cm     Diastology LVIDs:         2.20 cm  LV e' medial:    3.69 cm/s LV PW:         1.40 cm     LV E/e' medial:   25.0 LV IVS:        1.40 cm     LV e' lateral:   6.06 cm/s LVOT diam:     1.65 cm     LV E/e' lateral: 15.2 LV SV:         48 LV SV Index:   30 LVOT Area:     2.14 cm  LV Volumes (MOD) LV vol d, MOD A2C: 23.5 ml LV vol d, MOD A4C: 24.5 ml LV vol s, MOD A2C: 8.7 ml LV vol s, MOD A4C: 9.9 ml LV SV MOD A2C:     14.8 ml LV SV MOD A4C:     24.5 ml LV SV MOD BP:      14.4 ml RIGHT VENTRICLE            IVC RV S prime:     9.37 cm/s  IVC diam: 2.10 cm TAPSE (M-mode): 2.5 cm LEFT ATRIUM             Index        RIGHT ATRIUM           Index LA diam:        2.40 cm 1.51 cm/m   RA Area:     13.90 cm LA Vol (A2C):   67.1 ml 42.16 ml/m  RA Volume:   31.50 ml  19.79 ml/m LA Vol (A4C):   36.9 ml 23.18 ml/m LA Biplane Vol: 54.7 ml 34.37 ml/m  AORTIC VALVE LVOT Vmax:   93.80 cm/s LVOT Vmean:  65.200 cm/s LVOT VTI:    0.224 m  AORTA Ao Root diam: 3.00 cm Ao Asc diam:  2.90 cm MITRAL VALVE               TRICUSPID VALVE MV Area (PHT): 2.80 cm    TR Peak grad:   24.2 mmHg MV Decel Time: 271 msec    TR Vmax:        246.00 cm/s MV E velocity: 92.20 cm/s MV A velocity: 71.80 cm/s  SHUNTS MV E/A ratio:  1.28        Systemic VTI:  0.22 m                            Systemic Diam: 1.65 cm Buford Dresser MD Electronically signed by Buford Dresser MD Signature Date/Time: 04/07/2021/2:27:22 PM    Final      LOS: 1 day   Antonieta Pert, MD [facility]  04/09/2021, 9:08 AM

## 2021-04-09 NOTE — Progress Notes (Signed)
RN took 1200 VS and noticed SBP trending in the 170's-180's sustaining. RN went and grab prn hydralazine to give patient however patient last SBP is 131 prior to IV 5mg  hydralazine admin. Held IV 5mg  hydralazine and placed patient frequent vitals to q54min.

## 2021-04-10 DIAGNOSIS — R55 Syncope and collapse: Secondary | ICD-10-CM | POA: Diagnosis not present

## 2021-04-10 LAB — CBC
HCT: 37.1 % (ref 36.0–46.0)
Hemoglobin: 12.2 g/dL (ref 12.0–15.0)
MCH: 29.8 pg (ref 26.0–34.0)
MCHC: 32.9 g/dL (ref 30.0–36.0)
MCV: 90.5 fL (ref 80.0–100.0)
Platelets: 229 10*3/uL (ref 150–400)
RBC: 4.1 MIL/uL (ref 3.87–5.11)
RDW: 13.5 % (ref 11.5–15.5)
WBC: 10.5 10*3/uL (ref 4.0–10.5)
nRBC: 0 % (ref 0.0–0.2)

## 2021-04-10 LAB — COMPREHENSIVE METABOLIC PANEL
ALT: 30 U/L (ref 0–44)
AST: 35 U/L (ref 15–41)
Albumin: 2.7 g/dL — ABNORMAL LOW (ref 3.5–5.0)
Alkaline Phosphatase: 80 U/L (ref 38–126)
Anion gap: 7 (ref 5–15)
BUN: 17 mg/dL (ref 8–23)
CO2: 25 mmol/L (ref 22–32)
Calcium: 8.4 mg/dL — ABNORMAL LOW (ref 8.9–10.3)
Chloride: 107 mmol/L (ref 98–111)
Creatinine, Ser: 0.84 mg/dL (ref 0.44–1.00)
GFR, Estimated: >60 mL/min (ref >60)
Glucose, Bld: 126 mg/dL — ABNORMAL HIGH (ref 70–99)
Potassium: 3.1 mmol/L — ABNORMAL LOW (ref 3.5–5.1)
Sodium: 139 mmol/L (ref 135–145)
Total Bilirubin: 1 mg/dL (ref 0.3–1.2)
Total Protein: 5.4 g/dL — ABNORMAL LOW (ref 6.5–8.1)

## 2021-04-10 MED ORDER — POTASSIUM CHLORIDE 10 MEQ/100ML IV SOLN
10.0000 meq | INTRAVENOUS | Status: DC
  Administered 2021-04-10: 10 meq via INTRAVENOUS
  Filled 2021-04-10: qty 100

## 2021-04-10 MED ORDER — LOSARTAN POTASSIUM 50 MG PO TABS
50.0000 mg | ORAL_TABLET | Freq: Once | ORAL | Status: AC
  Administered 2021-04-10: 50 mg via ORAL
  Filled 2021-04-10: qty 1

## 2021-04-10 MED ORDER — POTASSIUM CHLORIDE 10 MEQ/100ML IV SOLN
10.0000 meq | INTRAVENOUS | Status: AC
  Administered 2021-04-10: 10 meq via INTRAVENOUS
  Filled 2021-04-10: qty 100

## 2021-04-10 MED ORDER — POTASSIUM CHLORIDE 10 MEQ/100ML IV SOLN
10.0000 meq | INTRAVENOUS | Status: AC
  Administered 2021-04-10 (×3): 10 meq via INTRAVENOUS
  Filled 2021-04-10 (×3): qty 100

## 2021-04-10 MED ORDER — POTASSIUM CHLORIDE 20 MEQ PO PACK
40.0000 meq | PACK | Freq: Once | ORAL | Status: DC
  Filled 2021-04-10: qty 2

## 2021-04-10 MED ORDER — LOSARTAN POTASSIUM 50 MG PO TABS
100.0000 mg | ORAL_TABLET | Freq: Every day | ORAL | Status: DC
  Administered 2021-04-11: 100 mg via ORAL
  Filled 2021-04-10: qty 2

## 2021-04-10 MED ORDER — POTASSIUM CHLORIDE IN NACL 40-0.9 MEQ/L-% IV SOLN
INTRAVENOUS | Status: DC
  Filled 2021-04-10 (×2): qty 1000

## 2021-04-10 NOTE — TOC Progression Note (Signed)
Transition of Care [facility]) - Progression Note    Patient Details  Name: ITZA MANIACI MRN: [id number] Date of Birth: 1928-12-15  Transition of Care [facility]) CM/SW [place], [place] Phone Number: [phone] 04/10/2021, 11:24 AM  Clinical Narrative:     CSW called Tye [place] at [facility] to confirm their COIVD protocol. CSW had to leave messaged.  TOC team will continue to assist with discharge planning needs.  Expected Discharge Plan: [place] Barriers to Discharge: Continued Medical Work up  Expected Discharge Plan and Services Expected Discharge Plan: [place] In-house Referral: Clinical Social Work   Post Acute Care Choice: [place] Living arrangements for the past 2 months: [place]                                       Social Determinants of Health (SDOH) Interventions    Readmission Risk Interventions No flowsheet data found.

## 2021-04-10 NOTE — Progress Notes (Signed)
PROGRESS NOTE    [name] Small  VOJ:[id number] DOB: [birth year] DOA: 04/06/2021 PCP: Billie Ruddy, MD   Chief Complaint  Patient presents with   Loss of Consciousness   Brief Narrative/Hospital Course:  [name] Small, 87 y.o. female with PMH of  atrial fibrillation on Xarelto, sick sinus syndrome with pacer, mild renal insufficiency, hypertension, and vertebral compression fracture status post vertebroplasty on 03/21/2021 presented from the skilled nursing facility with loss of appetite malaise fatigue and cough since 11/13 tested positive for COVID-19 at the facility and also had syncopal episode 11/15 at the facility witnessed by the staff while she was using the restroom.. In the ED afebrile first-degree block on the EKG RBBB incomplete, LAFB, chest x-ray negative for acute finding, CT head CT cervical spine no acute finding COVID-19 PCR is still positive with cycle threshold 22.3.  Patient was given IV fluids remdesivir and admitted. She had been orthostatic needing IV fluid hydration.   Subjective:  Seen and examined this morning.  Patient reports he feels much better at this morning but still weak poor appetite has not been eating breakfast this morning Potassium low unable to tolerate oral potassium and IV replacement ordered  Assessment & Plan:  COVID-19 infection: Symptomatic with generalized weakness poor intake, also with chest pain, chest x-ray clear not hypoxic. CT chest without contrast-interstitial lung disease, atelectasis mild bronchial thickening suggesting bronchitis.  Completed remdesivir.  Continue ceftriaxone for bronchitis management seen in the CT scan.  Continue bronchodilators  Syncope  Orthostatic Hypotension in the setting of decreased oral intake from COVID-19 infection: Continue PT OT repeat orthostatic vitals today, keep on IV fluid hydration. TTE-EF 60 to 65%, moderate concentric LVH G2 DD normal mitral valve and aortic valve with mild calcification.    Abnormal UA/pyuria but no fever ,procalcitonin- is normal-holding off on antibiotics pending urine culture.   Hypokalemia:unable to tolerate oral potassium and IV replacement ordered  Debility /deconditioning/generalized weakness: PTOT to continue, remains deconditioned and weak   Essential hypertension-accelerated hypertension blood pressure on higher side this morning-increase losartan cont metoprolol.  Atrial fibrillation with RVR-CHADs VASc=4, rate controlled on amiodarone and metoprolol and anticoagulated on Xarelto   CKD stage IIIa function is stable.  Recent Labs  Lab 04/06/21 1514 04/07/21 0244 04/08/21 0300 04/09/21 0231 04/10/21 0247  BUN 15 11 15 17 17   CREATININE 0.94 0.87 0.90 0.88 0.84   Nausea vomiting:Poor intake but no nausea vomiting this morning. Cont PPI,Maalox  DVT prophylaxis: Xarelto Code Status:   Code Status: Full Code Family Communication: plan of care discussed with patient , son updated over the phone   Status is: inpatient Remains hospitalized for ongoing management of severe generalized weakness low oral intake, orthostatic hypotension, COVID-19 infection and syncope -ongoing nausea at risk of dehydration needing IV fluid hydration.  Disposition: Currently not medically stable for discharge. Anticipated Disposition: SNF likely next 24 hours   Objective: Vitals last 24 hrs: Vitals:   04/10/21 0013 04/10/21 0300 04/10/21 0702 04/10/21 0814  BP: (!) 174/69 (!) 157/59 (!) 165/72 (!) 178/65  Pulse: 61 61 60 60  Resp: 16 (!) 22  15  Temp: 98.1 F (36.7 C) 97.9 F (36.6 C)  98.1 F (36.7 C)  TempSrc: Oral Oral  Bladder  SpO2: 100% 99%  99%  Weight: 49.5 kg     Height:       Weight change: 2.2 kg  Intake/Output Summary (Last 24 hours) at 04/10/2021 0957 Last data filed at 04/10/2021 0500 Gross per  24 hour  Intake 945.59 ml  Output 700 ml  Net 245.59 ml    Net IO Since Admission: 3,036.95 mL [04/10/21 0957]  Physical  Examination: General exam:AAOx3,older than stated age,weak appearing. HEENT:Oral mucosa moist, Ear/Nose WNL grossly, dentition normal. Respiratory system: bilaterally clear,no use of accessory muscle Cardiovascular system:S1 & S2 +,No JVD. Gastrointestinal system:Abdomen soft,NT,ND, BS+ Nervous System:Alert, awake, moving extremities and grossly nonfocal Extremities: No edema, distal peripheral pulses palpable.  Skin: No rashes,no icterus. MSK: Normal muscle bulk,tone, power    Medications reviewed:  Scheduled Meds:  amiodarone  200 mg Oral Q lunch   atorvastatin  40 mg Oral Q lunch   docusate sodium  100 mg Oral BID   losartan  50 mg Oral Daily   metoprolol tartrate  25 mg Oral BID   pantoprazole (PROTONIX) IV  40 mg Intravenous Q24H   polyvinyl alcohol  1 drop Both Eyes Daily   Rivaroxaban  15 mg Oral Q lunch   sodium chloride flush  3 mL Intravenous Q12H   Continuous Infusions:  0.9 % NaCl with KCl 40 mEq / L 75 mL/hr at 04/10/21 0737   cefTRIAXone (ROCEPHIN)  IV     potassium chloride 10 mEq (04/10/21 0954)    Diet Order             Diet Heart Room service appropriate? Yes; Fluid consistency: Thin  Diet effective now                          Weight change: 2.2 kg  Wt Readings from Last 3 Encounters:  04/10/21 49.5 kg  03/16/21 54.4 kg  03/09/21 54.4 kg     Consultants:see note  Procedures:see note Antimicrobials: Anti-infectives (From admission, onward)    Start     Dose/Rate Route Frequency Ordered Stop   04/10/21 1000  cefTRIAXone (ROCEPHIN) 2 g in sodium chloride 0.9 % 100 mL IVPB        2 g 200 mL/hr over 30 Minutes Intravenous Every 24 hours 04/09/21 1144 04/12/21 0959   04/09/21 1000  cefTRIAXone (ROCEPHIN) 1 g in sodium chloride 0.9 % 100 mL IVPB  Status:  Discontinued        1 g 200 mL/hr over 30 Minutes Intravenous Every 24 hours 04/09/21 0911 04/09/21 1144   04/07/21 1000  remdesivir 100 mg in sodium chloride 0.9 % 100 mL IVPB       See  Hyperspace for full Linked Orders Report.   100 mg 200 mL/hr over 30 Minutes Intravenous Daily 04/06/21 2124 04/08/21 1028   04/06/21 2130  remdesivir 200 mg in sodium chloride 0.9% 250 mL IVPB       See Hyperspace for full Linked Orders Report.   200 mg 580 mL/hr over 30 Minutes Intravenous Once 04/06/21 2124 04/06/21 2357      Culture/Microbiology    Component Value Date/Time   SDES URINE, CLEAN CATCH 04/07/2021 0904   SPECREQUEST  04/07/2021 0904    NONE Performed at [facility] [address] [place], [place] [address]    CULT >=100,000 COLONIES/mL ESCHERICHIA COLI (A) 04/07/2021 0904   REPTSTATUS 04/09/2021 FINAL 04/07/2021 5621    Other culture-see note  Unresulted Labs (From admission, onward)     Start     Ordered   04/06/21 1514  CBC WITH DIFFERENTIAL  ONCE - STAT,   STAT        04/06/21 1514  Data Reviewed: I have personally reviewed following labs and imaging studies CBC: Recent Labs  Lab 04/06/21 1840 04/07/21 0244 04/08/21 0300 04/09/21 0231 04/10/21 0247  WBC 6.0 4.5 6.0 6.4 10.5  NEUTROABS 4.4  --   --   --   --   HGB 13.0 12.5 13.1 13.3 12.2  HCT 40.6 38.1 39.2 39.7 37.1  MCV 94.2 94.3 91.2 90.4 90.5  PLT 142* 150 199 221 295    Basic Metabolic Panel: Recent Labs  Lab 04/06/21 1514 04/07/21 0244 04/08/21 0300 04/09/21 0231 04/10/21 0247  NA 138 140 137 137 139  K 3.6 3.5 3.0* 3.3* 3.1*  CL 100 104 103 104 107  CO2 27 25 22  21* 25  GLUCOSE 136* 89 104* 127* 126*  BUN 15 11 15 17 17   CREATININE 0.94 0.87 0.90 0.88 0.84  CALCIUM 8.9 8.4* 8.6* 8.9 8.4*  MG  --  1.9  --   --   --     GFR: Estimated Creatinine Clearance: 34.8 mL/min (by C-G formula based on SCr of 0.84 mg/dL). Liver Function Tests: Recent Labs  Lab 04/06/21 2221 04/07/21 0244 04/08/21 0300 04/09/21 0231 04/10/21 0247  AST 38 36 37 43* 35  ALT 34 32 32 35 30  ALKPHOS 88 84 83 91 80  BILITOT 0.9 1.1 1.1 1.4* 1.0  PROT 6.1* 6.1* 6.0* 6.0* 5.4*   ALBUMIN 3.0* 2.9* 2.9* 3.0* 2.7*    No results for input(s): LIPASE, AMYLASE in the last 168 hours. No results for input(s): AMMONIA in the last 168 hours. Coagulation Profile: No results for input(s): INR, PROTIME in the last 168 hours. Cardiac Enzymes: No results for input(s): CKTOTAL, CKMB, CKMBINDEX, TROPONINI in the last 168 hours. BNP (last 3 results) No results for input(s): PROBNP in the last 8760 hours. HbA1C: No results for input(s): HGBA1C in the last 72 hours. CBG: Recent Labs  Lab 04/06/21 1616 04/07/21 0614 04/09/21 0533  GLUCAP 131* 81 128*    Lipid Profile: No results for input(s): CHOL, HDL, LDLCALC, TRIG, CHOLHDL, LDLDIRECT in the last 72 hours. Thyroid Function Tests: No results for input(s): TSH, T4TOTAL, FREET4, T3FREE, THYROIDAB in the last 72 hours. Anemia Panel: No results for input(s): VITAMINB12, FOLATE, FERRITIN, TIBC, IRON, RETICCTPCT in the last 72 hours.  Sepsis Labs: Recent Labs  Lab 04/06/21 1534 04/06/21 1800 04/06/21 2221 04/07/21 1859 04/08/21 0300 04/09/21 0231  PROCALCITON  --   --  <0.10 <0.10 <0.10 <0.10  LATICACIDVEN 1.6 1.5  --   --   --   --      Recent Results (from the past 240 hour(s))  Resp Panel by RT-PCR (Flu A&B, Covid) Nasopharyngeal Swab     Status: Abnormal   Collection Time: 04/06/21  3:14 PM   Specimen: Nasopharyngeal Swab; Nasopharyngeal(NP) swabs in vial transport medium  Result Value Ref Range Status   SARS Coronavirus 2 by RT PCR POSITIVE (A) NEGATIVE Final    Comment: RESULT CALLED TO, READ BACK BY AND VERIFIED WITH: CHRIS CRISCOE 04/06/2021 @2044  BY JW (NOTE) SARS-CoV-2 target nucleic acids are DETECTED.  The SARS-CoV-2 RNA is generally detectable in upper respiratory specimens during the acute phase of infection. Positive results are indicative of the presence of the identified virus, but do not rule out bacterial infection or co-infection with other pathogens not detected by the test. Clinical  correlation with patient history and other diagnostic information is necessary to determine patient infection status. The expected result is Negative.  Fact Sheet for  Patients: EntrepreneurPulse.com.au  Fact Sheet for Healthcare Providers: IncredibleEmployment.be  This test is not yet approved or cleared by the [place] FDA and  has been authorized for detection and/or diagnosis of SARS-CoV-2 by FDA under an Emergency Use Authorization (EUA).  This EUA will remain in effect (meaning this test can  be used) for the duration of  the COVID-19 declaration under Section 564(b)(1) of the Act, 21 U.S.C. section 360bbb-3(b)(1), unless the authorization is terminated or revoked sooner.     Influenza A by PCR NEGATIVE NEGATIVE Final   Influenza B by PCR NEGATIVE NEGATIVE Final    Comment: (NOTE) The Xpert Xpress SARS-CoV-2/FLU/RSV plus assay is intended as an aid in the diagnosis of influenza from Nasopharyngeal swab specimens and should not be used as a sole basis for treatment. Nasal washings and aspirates are unacceptable for Xpert Xpress SARS-CoV-2/FLU/RSV testing.  Fact Sheet for Patients: EntrepreneurPulse.com.au  Fact Sheet for Healthcare Providers: IncredibleEmployment.be  This test is not yet approved or cleared by the [place] FDA and has been authorized for detection and/or diagnosis of SARS-CoV-2 by FDA under an Emergency Use Authorization (EUA). This EUA will remain in effect (meaning this test can be used) for the duration of the COVID-19 declaration under Section 564(b)(1) of the Act, 21 U.S.C. section 360bbb-3(b)(1), unless the authorization is terminated or revoked.  Performed at [facility], [place] [address], [place], [place] [address]   Urine Culture     Status: Abnormal   Collection Time: 04/07/21  9:04 AM   Specimen: Urine, Clean Catch  Result Value Ref Range Status    Specimen Description URINE, CLEAN CATCH  Final   Special Requests   Final    NONE Performed at [facility], [place] [address] [place], [place] [address]    Culture >=100,000 COLONIES/mL ESCHERICHIA COLI (A)  Final   Report Status 04/09/2021 FINAL  Final   Organism ID, Bacteria ESCHERICHIA COLI (A)  Final      Susceptibility   Escherichia coli - MIC*    AMPICILLIN 16 INTERMEDIATE Intermediate     CEFAZOLIN <=4 SENSITIVE Sensitive     CEFEPIME <=0.12 SENSITIVE Sensitive     CEFTRIAXONE <=0.25 SENSITIVE Sensitive     CIPROFLOXACIN >=4 RESISTANT Resistant     GENTAMICIN <=1 SENSITIVE Sensitive     IMIPENEM <=0.25 SENSITIVE Sensitive     NITROFURANTOIN <=16 SENSITIVE Sensitive     TRIMETH/SULFA <=20 SENSITIVE Sensitive     AMPICILLIN/SULBACTAM 4 SENSITIVE Sensitive     PIP/TAZO <=4 SENSITIVE Sensitive     * >=100,000 COLONIES/mL ESCHERICHIA COLI      Radiology Studies: CT CHEST WO CONTRAST  Result Date: 04/08/2021 CLINICAL DATA:  Cough.  COVID. EXAM: CT CHEST WITHOUT CONTRAST TECHNIQUE: Multidetector CT imaging of the chest was performed following the standard protocol without IV contrast. COMPARISON:  Chest x-ray April 06, 2021 FINDINGS: Cardiovascular: Calcified atherosclerosis is seen in the carotid arteries within the neck. Calcified atherosclerosis is seen in the thoracic aorta, primarily the arch and descending thoracic aorta. No aneurysm. Central pulmonary arteries are normal in caliber. Three-vessel coronary artery disease identified. The heart size is borderline. Mediastinum/Nodes: No pleural or pericardial effusions. The thyroid and esophagus are normal. No adenopathy. There is a 2 lead pacemaker. No other abnormalities in the chest wall. Lungs/Pleura: The central airways are normal. Mild bronchial wall thickening in the right lung base. No pneumothorax. No suspicious nodules or masses. No evidence of pneumonia. Mild dependent atelectasis identified. Atelectasis adjacent  to  the fissures. Mild peripheral reticular changes suggesting interstitial disease. Upper Abdomen: Possible pelvicaliectasis associated with the left kidney. Cyst in the right kidney. Previous cholecystectomy. Musculoskeletal: Vertebroplasty of a lower thoracic or upper lumbar vertebral body. IMPRESSION: 1. Mild peripheral reticular changes in the lungs consistent with interstitial lung disease, mild. Mild dependent atelectasis. 2. Mild bronchial wall thickening in the right base suggesting bronchitis. 3. Calcified atherosclerosis in the thoracic aorta and branching vessels. Three-vessel coronary artery disease. 4. Suggested mild pelvicaliectasis associated with the left kidney, age indeterminate. Aortic Atherosclerosis (ICD10-I70.0). Electronically Signed   By: Dorise Bullion III M.D.   On: 04/08/2021 13:54     LOS: 2 days   Antonieta Pert, MD [facility]  04/10/2021, 9:57 AM

## 2021-04-11 DIAGNOSIS — R059 Cough, unspecified: Secondary | ICD-10-CM | POA: Diagnosis not present

## 2021-04-11 DIAGNOSIS — R278 Other lack of coordination: Secondary | ICD-10-CM | POA: Diagnosis not present

## 2021-04-11 DIAGNOSIS — U071 COVID-19: Secondary | ICD-10-CM | POA: Diagnosis not present

## 2021-04-11 DIAGNOSIS — M810 Age-related osteoporosis without current pathological fracture: Secondary | ICD-10-CM | POA: Diagnosis not present

## 2021-04-11 DIAGNOSIS — M6281 Muscle weakness (generalized): Secondary | ICD-10-CM | POA: Diagnosis not present

## 2021-04-11 DIAGNOSIS — I480 Paroxysmal atrial fibrillation: Secondary | ICD-10-CM | POA: Diagnosis not present

## 2021-04-11 DIAGNOSIS — M2550 Pain in unspecified joint: Secondary | ICD-10-CM | POA: Diagnosis not present

## 2021-04-11 DIAGNOSIS — E039 Hypothyroidism, unspecified: Secondary | ICD-10-CM | POA: Diagnosis not present

## 2021-04-11 DIAGNOSIS — K5710 Diverticulosis of small intestine without perforation or abscess without bleeding: Secondary | ICD-10-CM | POA: Diagnosis not present

## 2021-04-11 DIAGNOSIS — R404 Transient alteration of awareness: Secondary | ICD-10-CM | POA: Diagnosis not present

## 2021-04-11 DIAGNOSIS — Z7401 Bed confinement status: Secondary | ICD-10-CM | POA: Diagnosis not present

## 2021-04-11 DIAGNOSIS — N390 Urinary tract infection, site not specified: Secondary | ICD-10-CM | POA: Diagnosis not present

## 2021-04-11 DIAGNOSIS — E860 Dehydration: Secondary | ICD-10-CM | POA: Diagnosis not present

## 2021-04-11 DIAGNOSIS — S32020A Wedge compression fracture of second lumbar vertebra, initial encounter for closed fracture: Secondary | ICD-10-CM | POA: Diagnosis not present

## 2021-04-11 DIAGNOSIS — R0989 Other specified symptoms and signs involving the circulatory and respiratory systems: Secondary | ICD-10-CM | POA: Diagnosis not present

## 2021-04-11 DIAGNOSIS — I10 Essential (primary) hypertension: Secondary | ICD-10-CM | POA: Diagnosis not present

## 2021-04-11 DIAGNOSIS — N1831 Chronic kidney disease, stage 3a: Secondary | ICD-10-CM | POA: Diagnosis not present

## 2021-04-11 DIAGNOSIS — R55 Syncope and collapse: Secondary | ICD-10-CM | POA: Diagnosis not present

## 2021-04-11 DIAGNOSIS — R2689 Other abnormalities of gait and mobility: Secondary | ICD-10-CM | POA: Diagnosis not present

## 2021-04-11 DIAGNOSIS — N1832 Chronic kidney disease, stage 3b: Secondary | ICD-10-CM | POA: Diagnosis not present

## 2021-04-11 DIAGNOSIS — R946 Abnormal results of thyroid function studies: Secondary | ICD-10-CM | POA: Diagnosis not present

## 2021-04-11 DIAGNOSIS — S32028D Other fracture of second lumbar vertebra, subsequent encounter for fracture with routine healing: Secondary | ICD-10-CM | POA: Diagnosis not present

## 2021-04-11 DIAGNOSIS — R051 Acute cough: Secondary | ICD-10-CM | POA: Diagnosis not present

## 2021-04-11 DIAGNOSIS — I495 Sick sinus syndrome: Secondary | ICD-10-CM | POA: Diagnosis not present

## 2021-04-11 DIAGNOSIS — M818 Other osteoporosis without current pathological fracture: Secondary | ICD-10-CM | POA: Diagnosis not present

## 2021-04-11 DIAGNOSIS — S32020D Wedge compression fracture of second lumbar vertebra, subsequent encounter for fracture with routine healing: Secondary | ICD-10-CM | POA: Diagnosis not present

## 2021-04-11 DIAGNOSIS — Z8616 Personal history of COVID-19: Secondary | ICD-10-CM | POA: Diagnosis not present

## 2021-04-11 LAB — BASIC METABOLIC PANEL
Anion gap: 10 (ref 5–15)
BUN: 8 mg/dL (ref 8–23)
CO2: 23 mmol/L (ref 22–32)
Calcium: 8.5 mg/dL — ABNORMAL LOW (ref 8.9–10.3)
Chloride: 106 mmol/L (ref 98–111)
Creatinine, Ser: 0.83 mg/dL (ref 0.44–1.00)
GFR, Estimated: >60 mL/min (ref >60)
Glucose, Bld: 94 mg/dL (ref 70–99)
Potassium: 4 mmol/L (ref 3.5–5.1)
Sodium: 139 mmol/L (ref 135–145)

## 2021-04-11 LAB — GLUCOSE, CAPILLARY: Glucose-Capillary: 82 mg/dL (ref 70–99)

## 2021-04-11 MED ORDER — PANTOPRAZOLE SODIUM 40 MG PO TBEC: 40.0000 mg | DELAYED_RELEASE_TABLET | Freq: Every day | ORAL | 0 refills | Status: DC

## 2021-04-11 MED ORDER — ONDANSETRON HCL 4 MG PO TABS: 4.0000 mg | ORAL_TABLET | Freq: Every day | ORAL | 1 refills | Status: AC | PRN

## 2021-04-11 MED ORDER — CEPHALEXIN 500 MG PO CAPS: 500.0000 mg | ORAL_CAPSULE | Freq: Three times a day (TID) | ORAL | 0 refills | Status: AC

## 2021-04-11 MED ORDER — LOSARTAN POTASSIUM 100 MG PO TABS: 100.0000 mg | ORAL_TABLET | Freq: Every day | ORAL | Status: DC

## 2021-04-11 NOTE — Consult Note (Signed)
   [facility] Inpatient Consult   04/11/2021  SKYELYNN RAMBEAU [birth year] [id number]  [employer] [[facility]] Patient: [employer]  Primary Care Provider:  Billie Ruddy, MD, [place] Jacklynn Ganong  is an embedded provider with a Chronic Care Management team and program, and is listed for the transition of care follow up and appointments.   Patient was reviewed for less than 30 days readmission.  Patient is being recommended for a skilled nursing facility level of care for transition.  Plan: If patient transitions to a skilled nursing level of care then post hospital transition needs are to be met at that level of care.  Please contact for further questions,  Natividad Brood, RN BSN [facility] Liaison  [phone] business mobile phone Toll free office [phone]  Fax number: [phone] [email or web address]

## 2021-04-11 NOTE — Discharge Summary (Signed)
Physician Discharge Summary  [name] Small ZDG:[id number] DOB: 1928/07/21 DOA: 04/06/2021  PCP: [name] Ruddy, MD  Admit date: 04/06/2021 Discharge date: 04/11/2021  [facility] From: [name] Disposition:  nh  Recommendations for Outpatient Follow-up:  Follow up with PCP in 1-2 weeks Please obtain BMP/CBC in one week   Home Health:no  Equipment/Devices: none  Discharge Condition: Stable Code Status:   Code Status: Full Code Diet recommendation:  Diet Order             Diet Heart Room service appropriate? Yes; Fluid consistency: Thin  Diet effective now                   Brief/Interim Summary: 87 y.o. female with PMH of  atrial fibrillation on Xarelto, sick sinus syndrome with pacer, mild renal insufficiency, hypertension, and vertebral compression fracture status post vertebroplasty on 03/21/2021 presented from the skilled nursing facility with loss of appetite malaise fatigue and cough since 11/13 tested positive for COVID-19 at the facility and also had syncopal episode 11/15 at the facility witnessed by the staff while she was using the restroom.. In the ED afebrile first-degree block on the EKG RBBB incomplete, LAFB, chest x-ray negative for acute finding, CT head CT cervical spine no acute finding COVID-19 PCR is still positive with cycle threshold 22.3.  Patient was given IV fluids remdesivir and admitted. She had been orthostatic needing IV fluid hydration. At this time patient is eating some no nausea vomiting.  Electrolytes have improved.  She is doing well on room air.  Plan is for discharge back to the nursing home.  Discharge Diagnoses:   COVID-19 infection: Symptomatic with generalized weakness poor intake, also with chest pain, chest x-ray clear not hypoxic. CT chest without contrast-interstitial lung disease, atelectasis mild bronchial thickening suggesting bronchitis.  Completed remdesivir.  Also given antibiotics changed to Keflex on discharge.  Continue  bronchodilators PT OT.  COVID-19 isolation protocol x10 days   Syncope  Orthostatic Hypotension in the setting of decreased oral intake from COVID-19 infection: Overall much improved blood pressure on higher side medication adjusted.  Continue PT OT.TTE-EF 60 to 65%, moderate concentric LVH G2 DD normal mitral valve and aortic valve with mild calcification.    Abnormal UA/pyuria- E coli UTI- cont keflex as above Hypokalemia:resolved   Debility /deconditioning/generalized weakness: PTOT to continue Essential hypertension-accelerated hypertension blood pressure on higher side losartan is increased.  Continue metoprolol.Follow-up with PCP to adjust the medication. Atrial fibrillation with RVR-CHADs VASc=4, rate controlled on amiodarone and metoprolol and anticoagulated on Xarelto  CKD stage IIIa function is stable. Nausea vomiting:Poor intake -improving some continue PPI and Maalox and Zofran  at the facility.    Consults: TOC  Subjective: Alert awake oriented eating well not in distress feels comfortable.  Agrees for discharge to facility today. Discharge Exam: Vitals:   04/11/21 0704 04/11/21 0747  BP: (!) 174/86 (!) 144/88  Pulse: 67 60  Resp:  19  Temp:  (!) 97.4 F (36.3 C)  SpO2:  100%   General: Pt is alert, awake, not in acute distress Cardiovascular: RRR, S1/S2 +, no rubs, no gallops Respiratory: CTA bilaterally, no wheezing, no rhonchi Abdominal: Soft, NT, ND, bowel sounds + Extremities: no edema, no cyanosis  Discharge Instructions  Discharge Instructions     Discharge instructions   Complete by: As directed    Continue with COVID-19 isolation protocol for 10 days since onset of disease or when patient was tested positive- positive test on 11/`6/22  Please  call call MD or return to ER for similar or worsening recurring problem that brought you to hospital or if any fever,nausea/vomiting,abdominal pain, uncontrolled pain, chest pain,  shortness of breath or any  other alarming symptoms.  Please follow-up your doctor as instructed in a week time and call the office for appointment.  Please avoid alcohol, smoking, or any other illicit substance and maintain healthy habits including taking your regular medications as prescribed.  You were cared for by a hospitalist during your hospital stay. If you have any questions about your discharge medications or the care you received while you were in the hospital after you are discharged, you can call the unit and ask to speak with the hospitalist on call if the hospitalist that took care of you is not available.  Once you are discharged, your primary care physician will handle any further medical issues. Please note that NO REFILLS for any discharge medications will be authorized once you are discharged, as it is imperative that you return to your primary care physician (or establish a relationship with a primary care physician if you do not have one) for your aftercare needs so that they can reassess your need for medications and monitor your lab values   Increase activity slowly   Complete by: As directed       Allergies as of 04/11/2021       Reactions   Septra Ds [sulfamethoxazole-trimethoprim]    Lisinopril Other (See Comments)   Cough        Medication List     TAKE these medications    acetaminophen 500 MG tablet Commonly known as: TYLENOL Take 1 tablet (500 mg total) by mouth every 6 (six) hours as needed.   amiodarone 200 MG tablet Commonly known as: PACERONE TAKE 1 TABLET(200 MG) BY MOUTH DAILY What changed: See the new instructions.   atorvastatin 40 MG tablet Commonly known as: LIPITOR TAKE 1 TABLET(40 MG) BY MOUTH DAILY What changed: See the new instructions.   calcitonin (salmon) 200 UNIT/ACT nasal spray Commonly known as: Miacalcin Place 1 spray into alternate nostrils daily.   cephALEXin 500 MG capsule Commonly known as: KEFLEX Take 1 capsule (500 mg total) by mouth 3  (three) times daily for 3 days.   docusate sodium 100 MG capsule Commonly known as: COLACE Take 1 capsule (100 mg total) by mouth 2 (two) times daily.   losartan 100 MG tablet Commonly known as: Cozaar Take 1 tablet (100 mg total) by mouth daily. Start taking on: April 12, 2021 What changed:  medication strength how much to take   metoprolol tartrate 25 MG tablet Commonly known as: LOPRESSOR Take 1 tablet (25 mg total) by mouth 2 (two) times daily.   ondansetron 4 MG tablet Commonly known as: Zofran Take 1 tablet (4 mg total) by mouth daily as needed for nausea or vomiting.   oxyCODONE 5 MG immediate release tablet Commonly known as: Oxy IR/ROXICODONE Take 1 tablet (5 mg total) by mouth every 4 (four) hours as needed for severe pain.   pantoprazole 40 MG tablet Commonly known as: Protonix Take 1 tablet (40 mg total) by mouth daily.   REFRESH OP Place 1 drop into both eyes daily.   Xarelto 15 MG Tabs tablet Generic drug: Rivaroxaban TAKE 1 TABLET(15 MG) BY MOUTH DAILY WITH DINNER What changed: See the new instructions.        Allergies  Allergen Reactions   Septra Ds [Sulfamethoxazole-Trimethoprim]    Lisinopril Other (See Comments)  Cough    The results of significant diagnostics from this hospitalization (including imaging, microbiology, ancillary and laboratory) are listed below for reference.    Microbiology: Recent Results (from the past 240 hour(s))  Resp Panel by RT-PCR (Flu A&B, Covid) Nasopharyngeal Swab     Status: Abnormal   Collection Time: 04/06/21  3:14 PM   Specimen: Nasopharyngeal Swab; Nasopharyngeal(NP) swabs in vial transport medium  Result Value Ref Range Status   SARS Coronavirus 2 by RT PCR POSITIVE (A) NEGATIVE Final    Comment: RESULT CALLED TO, READ BACK BY AND VERIFIED WITH: CHRIS CRISCOE 04/06/2021 @2044  BY JW (NOTE) SARS-CoV-2 target nucleic acids are DETECTED.  The SARS-CoV-2 RNA is generally detectable in upper  respiratory specimens during the acute phase of infection. Positive results are indicative of the presence of the identified virus, but do not rule out bacterial infection or co-infection with other pathogens not detected by the test. Clinical correlation with patient history and other diagnostic information is necessary to determine patient infection status. The expected result is Negative.  Fact Sheet for Patients: EntrepreneurPulse.com.au  Fact Sheet for Healthcare Providers: IncredibleEmployment.be  This test is not yet approved or cleared by the [place] FDA and  has been authorized for detection and/or diagnosis of SARS-CoV-2 by FDA under an Emergency Use Authorization (EUA).  This EUA will remain in effect (meaning this test can  be used) for the duration of  the COVID-19 declaration under Section 564(b)(1) of the Act, 21 U.S.C. section 360bbb-3(b)(1), unless the authorization is terminated or revoked sooner.     Influenza A by PCR NEGATIVE NEGATIVE Final   Influenza B by PCR NEGATIVE NEGATIVE Final    Comment: (NOTE) The Xpert Xpress SARS-CoV-2/FLU/RSV plus assay is intended as an aid in the diagnosis of influenza from Nasopharyngeal swab specimens and should not be used as a sole basis for treatment. Nasal washings and aspirates are unacceptable for Xpert Xpress SARS-CoV-2/FLU/RSV testing.  Fact Sheet for Patients: EntrepreneurPulse.com.au  Fact Sheet for Healthcare Providers: IncredibleEmployment.be  This test is not yet approved or cleared by the [place] FDA and has been authorized for detection and/or diagnosis of SARS-CoV-2 by FDA under an Emergency Use Authorization (EUA). This EUA will remain in effect (meaning this test can be used) for the duration of the COVID-19 declaration under Section 564(b)(1) of the Act, 21 U.S.C. section 360bbb-3(b)(1), unless the authorization is  terminated or revoked.  Performed at [facility], [place] [address] [place], [place] [address]   Urine Culture     Status: Abnormal   Collection Time: 04/07/21  9:04 AM   Specimen: Urine, Clean Catch  Result Value Ref Range Status   Specimen Description URINE, CLEAN CATCH  Final   Special Requests   Final    NONE Performed at [facility], [place] [address] [place], [place] [address]    Culture >=100,000 COLONIES/mL ESCHERICHIA COLI (A)  Final   Report Status 04/09/2021 FINAL  Final   Organism ID, Bacteria ESCHERICHIA COLI (A)  Final      Susceptibility   Escherichia coli - MIC*    AMPICILLIN 16 INTERMEDIATE Intermediate     CEFAZOLIN <=4 SENSITIVE Sensitive     CEFEPIME <=0.12 SENSITIVE Sensitive     CEFTRIAXONE <=0.25 SENSITIVE Sensitive     CIPROFLOXACIN >=4 RESISTANT Resistant     GENTAMICIN <=1 SENSITIVE Sensitive     IMIPENEM <=0.25 SENSITIVE Sensitive     NITROFURANTOIN <=16 SENSITIVE Sensitive  TRIMETH/SULFA <=20 SENSITIVE Sensitive     AMPICILLIN/SULBACTAM 4 SENSITIVE Sensitive     PIP/TAZO <=4 SENSITIVE Sensitive     * >=100,000 COLONIES/mL ESCHERICHIA COLI    Procedures/Studies: CT Head Wo Contrast  Result Date: 04/06/2021 CLINICAL DATA:  Syncope. EXAM: CT HEAD WITHOUT CONTRAST TECHNIQUE: Contiguous axial images were obtained from the base of the skull through the vertex without intravenous contrast. COMPARISON:  03/07/2021 FINDINGS: Brain: There is atrophy and chronic small vessel disease changes. No acute intracranial abnormality. Specifically, no hemorrhage, hydrocephalus, mass lesion, acute infarction, or significant intracranial injury. Vascular: No hyperdense vessel or unexpected calcification. Skull: No acute calvarial abnormality. Sinuses/Orbits: No acute findings Other: None IMPRESSION: Atrophy, chronic microvascular disease. No acute intracranial abnormality. Electronically Signed   By: Rolm Baptise M.D.   On: 04/06/2021 17:17   CT CHEST WO  CONTRAST  Result Date: 04/08/2021 CLINICAL DATA:  Cough.  COVID. EXAM: CT CHEST WITHOUT CONTRAST TECHNIQUE: Multidetector CT imaging of the chest was performed following the standard protocol without IV contrast. COMPARISON:  Chest x-ray April 06, 2021 FINDINGS: Cardiovascular: Calcified atherosclerosis is seen in the carotid arteries within the neck. Calcified atherosclerosis is seen in the thoracic aorta, primarily the arch and descending thoracic aorta. No aneurysm. Central pulmonary arteries are normal in caliber. Three-vessel coronary artery disease identified. The heart size is borderline. Mediastinum/Nodes: No pleural or pericardial effusions. The thyroid and esophagus are normal. No adenopathy. There is a 2 lead pacemaker. No other abnormalities in the chest wall. Lungs/Pleura: The central airways are normal. Mild bronchial wall thickening in the right lung base. No pneumothorax. No suspicious nodules or masses. No evidence of pneumonia. Mild dependent atelectasis identified. Atelectasis adjacent to the fissures. Mild peripheral reticular changes suggesting interstitial disease. Upper Abdomen: Possible pelvicaliectasis associated with the left kidney. Cyst in the right kidney. Previous cholecystectomy. Musculoskeletal: Vertebroplasty of a lower thoracic or upper lumbar vertebral body. IMPRESSION: 1. Mild peripheral reticular changes in the lungs consistent with interstitial lung disease, mild. Mild dependent atelectasis. 2. Mild bronchial wall thickening in the right base suggesting bronchitis. 3. Calcified atherosclerosis in the thoracic aorta and branching vessels. Three-vessel coronary artery disease. 4. Suggested mild pelvicaliectasis associated with the left kidney, age indeterminate. Aortic Atherosclerosis (ICD10-I70.0). Electronically Signed   By: Dorise Bullion III M.D.   On: 04/08/2021 13:54   CT Cervical Spine Wo Contrast  Result Date: 04/06/2021 CLINICAL DATA:  Syncope EXAM: CT  CERVICAL SPINE WITHOUT CONTRAST TECHNIQUE: Multidetector CT imaging of the cervical spine was performed without intravenous contrast. Multiplanar CT image reconstructions were also generated. COMPARISON:  None. FINDINGS: Alignment: Normal Skull base and vertebrae: No acute fracture. No primary bone lesion or focal pathologic process. Soft tissues and spinal canal: No prevertebral fluid or swelling. No visible canal hematoma. Disc levels:  Maintained Upper chest: Biapical scarring. Other: None IMPRESSION: No acute bony abnormality. Electronically Signed   By: Rolm Baptise M.D.   On: 04/06/2021 17:18   MR LUMBAR SPINE WO CONTRAST  Result Date: 03/17/2021 CLINICAL DATA:  Spine fracture, lumbosacral, traumatic worsening lower lumbar and sacral pain after fall 10 days ago, is anticoagulated EXAM: MRI LUMBAR SPINE WITHOUT CONTRAST TECHNIQUE: Multiplanar, multisequence MR imaging of the lumbar spine was performed. No intravenous contrast was administered. COMPARISON:  Lumbar radiographs March 07, 2021. 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 Alignment: Mild (grade 1) anterolisthesis of L5 on S1. L2 bony retropulsion is detailed below. Vertebrae: Acute/recent L2 superior endplate fracture with approximately 50% height  loss and 5-6 mm of bony retropulsion. Associated marrow edema with linear T2 hyperintense fracture cleft through the superior endplate. Bilateral pedicles are involved with marrow edema. Conus medullaris and cauda equina: Conus extends to the L1-L2 level. Conus appears normal. Paraspinal and other soft tissues: Right larger than left renal cysts, suboptimally evaluated due to motion. Disc levels: T12-L1: Only imaged sagittally without evidence of significant canal or foraminal stenosis. L1-L2: Approximately 5-6 mm of bony retropulsion along the superior endplate due to fracture described above. Resulting mild canal stenosis along the superior L2  vertebral body. No significant foraminal stenosis. L2-L3: Slight disc bulging and mild facet arthropathy without significant canal or foraminal stenosis. L3-L4: Mild posterior disc height loss. Mild broad disc bulge with superimposed bilateral foraminal disc protrusions, larger on the left. Mild bilateral facet arthropathy. Resulting moderate left foraminal stenosis without significant canal or right foraminal stenosis. Mild left subarticular recess narrowing. L4-L5: Mild posterior disc height loss. Mild disc bulging and mild bilateral facet arthropathy without significant canal or foraminal stenosis L5-S1: Grade 1 anterolisthesis. Uncovering the disc with right eccentric disc bulge. Right greater than left facet arthropathy. No significant canal or foraminal stenosis. S1-S2: Transitional anatomy. No significant canal or foraminal stenosis. IMPRESSION: 1. 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 2. Acute/recent L2 superior endplate fracture with 78% height loss, 5-6 mm of bony retropulsion, and involvement of bilateral pedicles. Resulting mild canal stenosis. 3. At L3-L4, moderate left foraminal stenosis. Electronically Signed   By: Margaretha Sheffield M.D.   On: 03/17/2021 12:27   DG Chest Port 1 View  Result Date: 04/06/2021 CLINICAL DATA:  Cough, COVID EXAM: PORTABLE CHEST 1 VIEW COMPARISON:  11/26/2015 FINDINGS: Left-sided implanted cardiac device with stable positioning of leads. Heart size within normal limits. Atherosclerotic calcification of the aortic knob. No focal airspace consolidation, pleural effusion, or pneumothorax. No acute bony findings. IMPRESSION: No acute cardiopulmonary findings. Electronically Signed   By: Davina Poke D.O.   On: 04/06/2021 15:42   ECHOCARDIOGRAM COMPLETE  Result Date: 04/07/2021    ECHOCARDIOGRAM REPORT   Patient Name:   MAVERY MILLING Date of Exam: 04/07/2021 Medical Rec #:  [id number]   Height:       65.0 in Accession #:    [id number]   Weight:       119.9 lb Date of Birth:  [birth year]    BSA:          1.592 m Patient Age:    21 years    BP:           171/64 mmHg Patient Gender: F           HR:           64 bpm. Exam Location:  Inpatient Procedure: 2D Echo, Cardiac Doppler and Color Doppler Indications:    Syncope  History:        Patient has prior history of Echocardiogram examinations, most                 recent 10/03/2011. Pacemaker; Risk Factors:Hypertension.  Sonographer:    Helmut Muster Referring Phys: [id number] TIMOTHY S OPYD  Sonographer Comments: Condition. IMPRESSIONS  1. Left ventricular ejection fraction, by estimation, is 60 to 65%. The left ventricle has normal function. The left ventricle has no regional wall motion abnormalities. There is moderate concentric left ventricular hypertrophy. Left ventricular diastolic parameters are consistent with Grade II diastolic dysfunction (pseudonormalization). Elevated left ventricular end-diastolic pressure.  2. Right ventricular systolic function is  normal. The right ventricular size is normal. There is normal pulmonary artery systolic pressure. The estimated right ventricular systolic pressure is 19.1 mmHg.  3. Left atrial size was moderately dilated.  4. The mitral valve is normal in structure. Trivial mitral valve regurgitation. No evidence of mitral stenosis.  5. The aortic valve is tricuspid. There is mild calcification of the aortic valve. There is mild thickening of the aortic valve. Aortic valve regurgitation is trivial. Aortic valve sclerosis is present, with no evidence of aortic valve stenosis.  6. The inferior vena cava is dilated in size with >50% respiratory variability, suggesting right atrial pressure of 8 mmHg. Comparison(s): No significant change from prior study. Conclusion(s)/Recommendation(s): Otherwise normal echocardiogram, with minor abnormalities described in the report. FINDINGS  Left Ventricle: Left ventricular ejection fraction, by estimation, is 60 to 65%. The left  ventricle has normal function. The left ventricle has no regional wall motion abnormalities. The left ventricular internal cavity size was small. There is moderate  concentric left ventricular hypertrophy. Left ventricular diastolic parameters are consistent with Grade II diastolic dysfunction (pseudonormalization). Elevated left ventricular end-diastolic pressure. Right Ventricle: The right ventricular size is normal. No increase in right ventricular wall thickness. Right ventricular systolic function is normal. There is normal pulmonary artery systolic pressure. The tricuspid regurgitant velocity is 2.46 m/s, and  with an assumed right atrial pressure of 8 mmHg, the estimated right ventricular systolic pressure is 47.8 mmHg. Left Atrium: Left atrial size was moderately dilated. Right Atrium: Right atrial size was normal in size. Pericardium: Trivial pericardial effusion is present. Mitral Valve: The mitral valve is normal in structure. Trivial mitral valve regurgitation. No evidence of mitral valve stenosis. Tricuspid Valve: The tricuspid valve is normal in structure. Tricuspid valve regurgitation is trivial. No evidence of tricuspid stenosis. Aortic Valve: The aortic valve is tricuspid. There is mild calcification of the aortic valve. There is mild thickening of the aortic valve. Aortic valve regurgitation is trivial. Aortic valve sclerosis is present, with no evidence of aortic valve stenosis. Pulmonic Valve: The pulmonic valve was not well visualized. Pulmonic valve regurgitation is not visualized. No evidence of pulmonic stenosis. Aorta: The aortic root, ascending aorta and aortic arch are all structurally normal, with no evidence of dilitation or obstruction. Venous: The inferior vena cava is dilated in size with greater than 50% respiratory variability, suggesting right atrial pressure of 8 mmHg. IAS/Shunts: The atrial septum is grossly normal. Additional Comments: A device lead is visualized.  LEFT  VENTRICLE PLAX 2D LVIDd:         3.10 cm     Diastology LVIDs:         2.20 cm     LV e' medial:    3.69 cm/s LV PW:         1.40 cm     LV E/e' medial:  25.0 LV IVS:        1.40 cm     LV e' lateral:   6.06 cm/s LVOT diam:     1.65 cm     LV E/e' lateral: 15.2 LV SV:         48 LV SV Index:   30 LVOT Area:     2.14 cm  LV Volumes (MOD) LV vol d, MOD A2C: 23.5 ml LV vol d, MOD A4C: 24.5 ml LV vol s, MOD A2C: 8.7 ml LV vol s, MOD A4C: 9.9 ml LV SV MOD A2C:     14.8 ml LV SV MOD A4C:  24.5 ml LV SV MOD BP:      14.4 ml RIGHT VENTRICLE            IVC RV S prime:     9.37 cm/s  IVC diam: 2.10 cm TAPSE (M-mode): 2.5 cm LEFT ATRIUM             Index        RIGHT ATRIUM           Index LA diam:        2.40 cm 1.51 cm/m   RA Area:     13.90 cm LA Vol (A2C):   67.1 ml 42.16 ml/m  RA Volume:   31.50 ml  19.79 ml/m LA Vol (A4C):   36.9 ml 23.18 ml/m LA Biplane Vol: 54.7 ml 34.37 ml/m  AORTIC VALVE LVOT Vmax:   93.80 cm/s LVOT Vmean:  65.200 cm/s LVOT VTI:    0.224 m  AORTA Ao Root diam: 3.00 cm Ao Asc diam:  2.90 cm MITRAL VALVE               TRICUSPID VALVE MV Area (PHT): 2.80 cm    TR Peak grad:   24.2 mmHg MV Decel Time: 271 msec    TR Vmax:        246.00 cm/s MV E velocity: 92.20 cm/s MV A velocity: 71.80 cm/s  SHUNTS MV E/A ratio:  1.28        Systemic VTI:  0.22 m                            Systemic Diam: 1.65 cm Buford Dresser MD Electronically signed by Buford Dresser MD Signature Date/Time: 04/07/2021/2:27:22 PM    Final     Labs: BNP (last 3 results) No results for input(s): BNP in the last 8760 hours. Basic Metabolic Panel: Recent Labs  Lab 04/07/21 0244 04/08/21 0300 04/09/21 0231 04/10/21 0247 04/11/21 0706  NA 140 137 137 139 139  K 3.5 3.0* 3.3* 3.1* 4.0  CL 104 103 104 107 106  CO2 25 22 21* 25 23  GLUCOSE 89 104* 127* 126* 94  BUN 11 15 17 17 8   CREATININE 0.87 0.90 0.88 0.84 0.83  CALCIUM 8.4* 8.6* 8.9 8.4* 8.5*  MG 1.9  --   --   --   --    Liver Function  Tests: Recent Labs  Lab 04/06/21 2221 04/07/21 0244 04/08/21 0300 04/09/21 0231 04/10/21 0247  AST 38 36 37 43* 35  ALT 34 32 32 35 30  ALKPHOS 88 84 83 91 80  BILITOT 0.9 1.1 1.1 1.4* 1.0  PROT 6.1* 6.1* 6.0* 6.0* 5.4*  ALBUMIN 3.0* 2.9* 2.9* 3.0* 2.7*   No results for input(s): LIPASE, AMYLASE in the last 168 hours. No results for input(s): AMMONIA in the last 168 hours. CBC: Recent Labs  Lab 04/06/21 1840 04/07/21 0244 04/08/21 0300 04/09/21 0231 04/10/21 0247  WBC 6.0 4.5 6.0 6.4 10.5  NEUTROABS 4.4  --   --   --   --   HGB 13.0 12.5 13.1 13.3 12.2  HCT 40.6 38.1 39.2 39.7 37.1  MCV 94.2 94.3 91.2 90.4 90.5  PLT 142* 150 199 221 229   Cardiac Enzymes: No results for input(s): CKTOTAL, CKMB, CKMBINDEX, TROPONINI in the last 168 hours. BNP: Invalid input(s): POCBNP CBG: Recent Labs  Lab 04/06/21 1616 04/07/21 0614 04/09/21 0533 04/11/21 0557  GLUCAP 131* 81 128* 82  D-Dimer No results for input(s): DDIMER in the last 72 hours. Hgb A1c No results for input(s): HGBA1C in the last 72 hours. Lipid Profile No results for input(s): CHOL, HDL, LDLCALC, TRIG, CHOLHDL, LDLDIRECT in the last 72 hours. Thyroid function studies No results for input(s): TSH, T4TOTAL, T3FREE, THYROIDAB in the last 72 hours.  Invalid input(s): FREET3 Anemia work up No results for input(s): VITAMINB12, FOLATE, FERRITIN, TIBC, IRON, RETICCTPCT in the last 72 hours. Urinalysis    Component Value Date/Time   COLORURINE AMBER (A) 04/07/2021 0620   APPEARANCEUR CLOUDY (A) 04/07/2021 0620   LABSPEC 1.026 04/07/2021 0620   PHURINE 5.0 04/07/2021 0620   GLUCOSEU NEGATIVE 04/07/2021 0620   HGBUR SMALL (A) 04/07/2021 0620   HGBUR negative 06/16/2008 0000   BILIRUBINUR NEGATIVE 04/07/2021 0620   BILIRUBINUR negative 02/02/2020 1029   KETONESUR 20 (A) 04/07/2021 0620   PROTEINUR 30 (A) 04/07/2021 0620   UROBILINOGEN 0.2 02/02/2020 1029   UROBILINOGEN 0.2 01/27/2015 1052   NITRITE  NEGATIVE 04/07/2021 0620   LEUKOCYTESUR SMALL (A) 04/07/2021 0620   Sepsis Labs Invalid input(s): PROCALCITONIN,  WBC,  LACTICIDVEN Microbiology Recent Results (from the past 240 hour(s))  Resp Panel by RT-PCR (Flu A&B, Covid) Nasopharyngeal Swab     Status: Abnormal   Collection Time: 04/06/21  3:14 PM   Specimen: Nasopharyngeal Swab; Nasopharyngeal(NP) swabs in vial transport medium  Result Value Ref Range Status   SARS Coronavirus 2 by RT PCR POSITIVE (A) NEGATIVE Final    Comment: RESULT CALLED TO, READ BACK BY AND VERIFIED WITH: CHRIS CRISCOE 04/06/2021 @2044  BY JW (NOTE) SARS-CoV-2 target nucleic acids are DETECTED.  The SARS-CoV-2 RNA is generally detectable in upper respiratory specimens during the acute phase of infection. Positive results are indicative of the presence of the identified virus, but do not rule out bacterial infection or co-infection with other pathogens not detected by the test. Clinical correlation with patient history and other diagnostic information is necessary to determine patient infection status. The expected result is Negative.  Fact Sheet for Patients: EntrepreneurPulse.com.au  Fact Sheet for Healthcare Providers: IncredibleEmployment.be  This test is not yet approved or cleared by the [place] FDA and  has been authorized for detection and/or diagnosis of SARS-CoV-2 by FDA under an Emergency Use Authorization (EUA).  This EUA will remain in effect (meaning this test can  be used) for the duration of  the COVID-19 declaration under Section 564(b)(1) of the Act, 21 U.S.C. section 360bbb-3(b)(1), unless the authorization is terminated or revoked sooner.     Influenza A by PCR NEGATIVE NEGATIVE Final   Influenza B by PCR NEGATIVE NEGATIVE Final    Comment: (NOTE) The Xpert Xpress SARS-CoV-2/FLU/RSV plus assay is intended as an aid in the diagnosis of influenza from Nasopharyngeal swab specimens  and should not be used as a sole basis for treatment. Nasal washings and aspirates are unacceptable for Xpert Xpress SARS-CoV-2/FLU/RSV testing.  Fact Sheet for Patients: EntrepreneurPulse.com.au  Fact Sheet for Healthcare Providers: IncredibleEmployment.be  This test is not yet approved or cleared by the [place] FDA and has been authorized for detection and/or diagnosis of SARS-CoV-2 by FDA under an Emergency Use Authorization (EUA). This EUA will remain in effect (meaning this test can be used) for the duration of the COVID-19 declaration under Section 564(b)(1) of the Act, 21 U.S.C. section 360bbb-3(b)(1), unless the authorization is terminated or revoked.  Performed at [facility], [place] [address], [place], [place] [address]   Urine Culture  Status: Abnormal   Collection Time: 04/07/21  9:04 AM   Specimen: Urine, Clean Catch  Result Value Ref Range Status   Specimen Description URINE, CLEAN CATCH  Final   Special Requests   Final    NONE Performed at [facility], [address] [place], [place] [address]    Culture >=100,000 COLONIES/mL ESCHERICHIA COLI (A)  Final   Report Status 04/09/2021 FINAL  Final   Organism ID, Bacteria ESCHERICHIA COLI (A)  Final      Susceptibility   Escherichia coli - MIC*    AMPICILLIN 16 INTERMEDIATE Intermediate     CEFAZOLIN <=4 SENSITIVE Sensitive     CEFEPIME <=0.12 SENSITIVE Sensitive     CEFTRIAXONE <=0.25 SENSITIVE Sensitive     CIPROFLOXACIN >=4 RESISTANT Resistant     GENTAMICIN <=1 SENSITIVE Sensitive     IMIPENEM <=0.25 SENSITIVE Sensitive     NITROFURANTOIN <=16 SENSITIVE Sensitive     TRIMETH/SULFA <=20 SENSITIVE Sensitive     AMPICILLIN/SULBACTAM 4 SENSITIVE Sensitive     PIP/TAZO <=4 SENSITIVE Sensitive     * >=100,000 COLONIES/mL ESCHERICHIA COLI    Time coordinating discharge: 25 minutes  SIGNED: Antonieta Pert, MD  [facility] 04/11/2021, 10:20 AM  If  7PM-7AM, please contact night-coverage [email or web address]

## 2021-04-11 NOTE — Plan of Care (Signed)
  Problem: Education: Goal: Knowledge of General Education information will improve Description: Including pain rating scale, medication(s)/side effects and non-pharmacologic comfort measures Outcome: Progressing   Problem: Clinical Measurements: Goal: Diagnostic test results will improve Outcome: Progressing   Problem: Clinical Measurements: Goal: Cardiovascular complication will be avoided Outcome: Progressing   

## 2021-04-11 NOTE — Progress Notes (Signed)
Physical Therapy Treatment Patient Details Name: [name] Small MRN: [id number] DOB: 1929/03/31 Today's Date: 04/11/2021   History of Present Illness [name] Small, 87 y.o. female admitted 11/16  presenting from skilled nursing facility with loss of appetite, malaise fatigue and cough since 11/13 tested positive for COVID-19 at the facility and also had syncopal episode 11/15 at the facility witnessed by the staff while she was using the restroom.  In the ED afebrile  with first-degree block on the EKG.   PMH: atrial fibrillation on Xarelto, sick sinus syndrome with pacer, mild renal insufficiency, hypertension, and vertebral compression fracture status post vertebroplasty on 03/21/2021    PT Comments    The pt was able to progress mobility in the room this morning despite reports of some continued nausea at this time. She completed sit-stand and step-pivot transfers with minG, and then after short seated rest, completed 15 ft ambulation in the room. All vital signs remained stable with mobility at this time on RA. The pt will continue to benefit from skilled PT acutely to progress functional strength, power, and stability to improve independence with transfers, as well as endurance training to progress activity tolerance.     Recommendations for follow up therapy are one component of a multi-disciplinary discharge planning process, led by the attending physician.  Recommendations may be updated based on patient status, additional functional criteria and insurance authorization.  Follow Up Recommendations  Skilled nursing-short term rehab (<3 hours/day)     Assistance Recommended at Discharge Frequent or constant Supervision/Assistance  Equipment Recommendations  Rolling walker (2 wheels)    Recommendations for Other Services       Precautions / Restrictions Precautions Precautions: Fall;Back Precaution Booklet Issued: No Precaution Comments: followed back precautions for comfort,  COVID Restrictions Weight Bearing Restrictions: No     Mobility  Bed Mobility Overal bed mobility: Needs Assistance Bed Mobility: Sidelying to Sit;Rolling;Sit to Supine Rolling: Min guard Sidelying to sit: Min guard   Sit to supine: Min guard   General bed mobility comments: pt completing with increased time, cues for safety    Transfers Overall transfer level: Needs assistance Equipment used: Rolling walker (2 wheels) Transfers: Sit to/from Stand;Bed to chair/wheelchair/BSC Sit to Stand: Min guard     Step pivot transfers: Min guard     General transfer comment: minG for safety, needed VC for hand placement repeatedly, but once hands in correct position was able to power up without physical assist.    Ambulation/Gait Ambulation/Gait assistance: Min guard Gait Distance (Feet): 15 Feet Assistive device: Rolling walker (2 wheels) Gait Pattern/deviations: Step-through pattern;Decreased stride length;Narrow base of support;Trunk flexed Gait velocity: decreased Gait velocity interpretation: <1.31 ft/sec, indicative of household ambulator   General Gait Details: cues for forward gaze and upright posture. min guard for safety due to general unsteadiness. pt with LLE internally rotated and states this is baseline following prior hip surgery       Balance Overall balance assessment: Needs assistance Sitting-balance support: No upper extremity supported;Feet supported Sitting balance-Leahy Scale: Fair     Standing balance support: Reliant on assistive device for balance Standing balance-Leahy Scale: Poor Standing balance comment: reliant on UE support                            Cognition Arousal/Alertness: Awake/alert Behavior During Therapy: WFL for tasks assessed/performed Overall Cognitive Status: Impaired/Different from baseline Area of Impairment: Memory;Awareness;Safety/judgement  Memory: Decreased recall of  precautions;Decreased short-term memory   Safety/Judgement: Decreased awareness of safety Awareness: Emergent   General Comments: pt following all cues/commands, able to recall information RN gave about d/c during session. needs cues for safety. asking same questions repeatedly through session        Exercises      General Comments General comments (skin integrity, edema, etc.): VSS on RA      Pertinent Vitals/Pain Pain Assessment: No/denies pain Pain Intervention(s): Monitored during session     PT Goals (current goals can now be found in the care plan section) Acute Rehab PT Goals Patient Stated Goal: to get better PT Goal Formulation: With patient Time For Goal Achievement: 04/22/21 Potential to Achieve Goals: Fair Progress towards PT goals: Progressing toward goals    Frequency    Min 2X/week      PT Plan Current plan remains appropriate       AM-PAC PT "6 Clicks" Mobility   Outcome Measure  Help needed turning from your back to your side while in a flat bed without using bedrails?: A Little Help needed moving from lying on your back to sitting on the side of a flat bed without using bedrails?: A Little Help needed moving to and from a bed to a chair (including a wheelchair)?: A Lot Help needed standing up from a chair using your arms (e.g., wheelchair or bedside chair)?: A Lot Help needed to walk in hospital room?: A Lot Help needed climbing 3-5 steps with a railing? : Total 6 Click Score: 13    End of Session Equipment Utilized During Treatment: Gait belt Activity Tolerance: Patient tolerated treatment well Patient left: in bed;with call bell/phone within reach;with bed alarm set Nurse Communication: Mobility status PT Visit Diagnosis: Unsteadiness on feet (R26.81);Muscle weakness (generalized) (M62.81);History of falling (Z91.81)     Time: 6644-0347 PT Time Calculation (min) (ACUTE ONLY): 32 min  Charges:  $Gait Training: 8-22 mins $Therapeutic  Activity: 8-22 mins                     West Carbo, PT, DPT   Acute Rehabilitation Department Pager #: [phone]   Sandra Cockayne 04/11/2021, 12:10 PM

## 2021-04-11 NOTE — TOC Transition Note (Signed)
Transition of Care [facility]) - CM/SW Discharge Note   Patient Details  Name: [name] Small MRN: [id number] Date of Birth: Apr 18, 1929  [facility] CM/SW Contact:  Tresa Endo Phone Number: 04/11/2021, 3:37 PM   Clinical Narrative:    Patient will [facility] to: [facility] Anticipated DC date: 04/11/2021 Family notified: Pt Son Transport by: Corey Harold   Per MD patient ready for DC to [facility] room 208. RN to call report prior to discharge [phone]). RN, patient, patient's family, and facility notified of DC. Discharge Summary and FL2 sent to facility. DC packet on chart. Ambulance transport requested for patient.   CSW will sign off for now as social work intervention is no longer needed. Please consult [place] again if new needs arise.     Final next level of care: [place] Barriers to Discharge: Continued Medical Work up   Patient Goals and CMS Choice Patient states their goals for this hospitalization and ongoing recovery are:: Return to [employer].gov Compare Post Acute Care list provided to:: Patient Choice offered to / list presented to : Patient  Discharge Placement                       Discharge Plan and Services In-house Referral: Clinical Social Work   Post Acute Care Choice: [place]                               Social Determinants of Health (SDOH) Interventions     Readmission Risk Interventions No flowsheet data found.

## 2021-04-13 DIAGNOSIS — K5710 Diverticulosis of small intestine without perforation or abscess without bleeding: Secondary | ICD-10-CM | POA: Diagnosis not present

## 2021-04-13 DIAGNOSIS — I10 Essential (primary) hypertension: Secondary | ICD-10-CM | POA: Diagnosis not present

## 2021-04-13 DIAGNOSIS — N1832 Chronic kidney disease, stage 3b: Secondary | ICD-10-CM | POA: Diagnosis not present

## 2021-04-13 DIAGNOSIS — S32020A Wedge compression fracture of second lumbar vertebra, initial encounter for closed fracture: Secondary | ICD-10-CM | POA: Diagnosis not present

## 2021-04-13 DIAGNOSIS — I480 Paroxysmal atrial fibrillation: Secondary | ICD-10-CM | POA: Diagnosis not present

## 2021-04-13 DIAGNOSIS — Z8616 Personal history of COVID-19: Secondary | ICD-10-CM | POA: Diagnosis not present

## 2021-04-13 DIAGNOSIS — M810 Age-related osteoporosis without current pathological fracture: Secondary | ICD-10-CM | POA: Diagnosis not present

## 2021-04-13 DIAGNOSIS — I495 Sick sinus syndrome: Secondary | ICD-10-CM | POA: Diagnosis not present

## 2021-04-13 DIAGNOSIS — E860 Dehydration: Secondary | ICD-10-CM | POA: Diagnosis not present

## 2021-04-13 LAB — GLUCOSE, CAPILLARY: Glucose-Capillary: 120 mg/dL — ABNORMAL HIGH (ref 70–99)

## 2021-04-15 DIAGNOSIS — N390 Urinary tract infection, site not specified: Secondary | ICD-10-CM | POA: Diagnosis not present

## 2021-04-15 DIAGNOSIS — E860 Dehydration: Secondary | ICD-10-CM | POA: Diagnosis not present

## 2021-04-15 DIAGNOSIS — Z8616 Personal history of COVID-19: Secondary | ICD-10-CM | POA: Diagnosis not present

## 2021-04-15 DIAGNOSIS — N1832 Chronic kidney disease, stage 3b: Secondary | ICD-10-CM | POA: Diagnosis not present

## 2021-04-15 DIAGNOSIS — S32020A Wedge compression fracture of second lumbar vertebra, initial encounter for closed fracture: Secondary | ICD-10-CM | POA: Diagnosis not present

## 2021-04-15 DIAGNOSIS — K5710 Diverticulosis of small intestine without perforation or abscess without bleeding: Secondary | ICD-10-CM | POA: Diagnosis not present

## 2021-04-15 DIAGNOSIS — I495 Sick sinus syndrome: Secondary | ICD-10-CM | POA: Diagnosis not present

## 2021-04-15 DIAGNOSIS — I480 Paroxysmal atrial fibrillation: Secondary | ICD-10-CM | POA: Diagnosis not present

## 2021-04-15 DIAGNOSIS — I10 Essential (primary) hypertension: Secondary | ICD-10-CM | POA: Diagnosis not present

## 2021-04-15 DIAGNOSIS — M818 Other osteoporosis without current pathological fracture: Secondary | ICD-10-CM | POA: Diagnosis not present

## 2021-04-18 DIAGNOSIS — I10 Essential (primary) hypertension: Secondary | ICD-10-CM | POA: Diagnosis not present

## 2021-04-18 DIAGNOSIS — S32020A Wedge compression fracture of second lumbar vertebra, initial encounter for closed fracture: Secondary | ICD-10-CM | POA: Diagnosis not present

## 2021-04-18 DIAGNOSIS — E039 Hypothyroidism, unspecified: Secondary | ICD-10-CM | POA: Diagnosis not present

## 2021-04-18 DIAGNOSIS — R051 Acute cough: Secondary | ICD-10-CM | POA: Diagnosis not present

## 2021-04-18 DIAGNOSIS — M818 Other osteoporosis without current pathological fracture: Secondary | ICD-10-CM | POA: Diagnosis not present

## 2021-04-18 DIAGNOSIS — K5710 Diverticulosis of small intestine without perforation or abscess without bleeding: Secondary | ICD-10-CM | POA: Diagnosis not present

## 2021-04-18 DIAGNOSIS — N1832 Chronic kidney disease, stage 3b: Secondary | ICD-10-CM | POA: Diagnosis not present

## 2021-04-18 DIAGNOSIS — E860 Dehydration: Secondary | ICD-10-CM | POA: Diagnosis not present

## 2021-04-18 DIAGNOSIS — I480 Paroxysmal atrial fibrillation: Secondary | ICD-10-CM | POA: Diagnosis not present

## 2021-04-18 DIAGNOSIS — N390 Urinary tract infection, site not specified: Secondary | ICD-10-CM | POA: Diagnosis not present

## 2021-04-18 DIAGNOSIS — I495 Sick sinus syndrome: Secondary | ICD-10-CM | POA: Diagnosis not present

## 2021-04-18 DIAGNOSIS — Z8616 Personal history of COVID-19: Secondary | ICD-10-CM | POA: Diagnosis not present

## 2021-04-20 DIAGNOSIS — N390 Urinary tract infection, site not specified: Secondary | ICD-10-CM | POA: Diagnosis not present

## 2021-04-20 DIAGNOSIS — S32020A Wedge compression fracture of second lumbar vertebra, initial encounter for closed fracture: Secondary | ICD-10-CM | POA: Diagnosis not present

## 2021-04-20 DIAGNOSIS — R051 Acute cough: Secondary | ICD-10-CM | POA: Diagnosis not present

## 2021-04-20 DIAGNOSIS — I480 Paroxysmal atrial fibrillation: Secondary | ICD-10-CM | POA: Diagnosis not present

## 2021-04-20 DIAGNOSIS — E860 Dehydration: Secondary | ICD-10-CM | POA: Diagnosis not present

## 2021-04-20 DIAGNOSIS — I10 Essential (primary) hypertension: Secondary | ICD-10-CM | POA: Diagnosis not present

## 2021-04-20 DIAGNOSIS — I495 Sick sinus syndrome: Secondary | ICD-10-CM | POA: Diagnosis not present

## 2021-04-20 DIAGNOSIS — N1832 Chronic kidney disease, stage 3b: Secondary | ICD-10-CM | POA: Diagnosis not present

## 2021-04-20 DIAGNOSIS — E039 Hypothyroidism, unspecified: Secondary | ICD-10-CM | POA: Diagnosis not present

## 2021-04-20 DIAGNOSIS — Z8616 Personal history of COVID-19: Secondary | ICD-10-CM | POA: Diagnosis not present

## 2021-04-20 DIAGNOSIS — M818 Other osteoporosis without current pathological fracture: Secondary | ICD-10-CM | POA: Diagnosis not present

## 2021-04-21 DIAGNOSIS — I495 Sick sinus syndrome: Secondary | ICD-10-CM | POA: Diagnosis not present

## 2021-04-21 DIAGNOSIS — N1832 Chronic kidney disease, stage 3b: Secondary | ICD-10-CM | POA: Diagnosis not present

## 2021-04-21 DIAGNOSIS — I10 Essential (primary) hypertension: Secondary | ICD-10-CM | POA: Diagnosis not present

## 2021-04-21 DIAGNOSIS — E860 Dehydration: Secondary | ICD-10-CM | POA: Diagnosis not present

## 2021-04-21 DIAGNOSIS — M818 Other osteoporosis without current pathological fracture: Secondary | ICD-10-CM | POA: Diagnosis not present

## 2021-04-21 DIAGNOSIS — E039 Hypothyroidism, unspecified: Secondary | ICD-10-CM | POA: Diagnosis not present

## 2021-04-21 DIAGNOSIS — I480 Paroxysmal atrial fibrillation: Secondary | ICD-10-CM | POA: Diagnosis not present

## 2021-04-21 DIAGNOSIS — Z8616 Personal history of COVID-19: Secondary | ICD-10-CM | POA: Diagnosis not present

## 2021-04-21 DIAGNOSIS — N390 Urinary tract infection, site not specified: Secondary | ICD-10-CM | POA: Diagnosis not present

## 2021-04-22 DIAGNOSIS — N390 Urinary tract infection, site not specified: Secondary | ICD-10-CM | POA: Diagnosis not present

## 2021-04-22 DIAGNOSIS — S32020A Wedge compression fracture of second lumbar vertebra, initial encounter for closed fracture: Secondary | ICD-10-CM | POA: Diagnosis not present

## 2021-04-22 DIAGNOSIS — E039 Hypothyroidism, unspecified: Secondary | ICD-10-CM | POA: Diagnosis not present

## 2021-04-22 DIAGNOSIS — N1832 Chronic kidney disease, stage 3b: Secondary | ICD-10-CM | POA: Diagnosis not present

## 2021-04-22 DIAGNOSIS — I10 Essential (primary) hypertension: Secondary | ICD-10-CM | POA: Diagnosis not present

## 2021-04-22 DIAGNOSIS — K5710 Diverticulosis of small intestine without perforation or abscess without bleeding: Secondary | ICD-10-CM | POA: Diagnosis not present

## 2021-04-22 DIAGNOSIS — E860 Dehydration: Secondary | ICD-10-CM | POA: Diagnosis not present

## 2021-04-22 DIAGNOSIS — M818 Other osteoporosis without current pathological fracture: Secondary | ICD-10-CM | POA: Diagnosis not present

## 2021-04-22 DIAGNOSIS — I480 Paroxysmal atrial fibrillation: Secondary | ICD-10-CM | POA: Diagnosis not present

## 2021-04-22 DIAGNOSIS — R051 Acute cough: Secondary | ICD-10-CM | POA: Diagnosis not present

## 2021-04-22 DIAGNOSIS — I495 Sick sinus syndrome: Secondary | ICD-10-CM | POA: Diagnosis not present

## 2021-04-22 DIAGNOSIS — Z8616 Personal history of COVID-19: Secondary | ICD-10-CM | POA: Diagnosis not present

## 2021-04-27 ENCOUNTER — Inpatient Hospital Stay: Payer: Medicare Other | Admitting: Family Medicine

## 2021-04-27 ENCOUNTER — Telehealth: Payer: Self-pay | Admitting: Family Medicine

## 2021-04-27 NOTE — Telephone Encounter (Signed)
Joey PT with [facility] is calling and pt needs physical therapy 1x1, 1x3 and then 1 eow x 4

## 2021-04-28 ENCOUNTER — Telehealth: Payer: Self-pay

## 2021-04-28 ENCOUNTER — Telehealth: Payer: Self-pay | Admitting: Family Medicine

## 2021-04-28 NOTE — Telephone Encounter (Signed)
Spoke with Danae Chen, asked if pt needed to have the losartan, stated pt does not have any at home but was given while in hospital. Informed Dr Volanda Napoleon, wanted to know what all pt is taking, got information from [facility], informed Dr Volanda Napoleon. Dr Volanda Napoleon states to keep a check on pt's BP and if it goes above 130/90 to let office know.

## 2021-04-28 NOTE — Telephone Encounter (Signed)
Spoke with Danae Chen, asked if pt needed to have the losartan, stated pt does not have any at home but was given while in hospital. Informed Dr Volanda Napoleon, wanted to know what all pt is taking, got information from [facility], informed Dr Volanda Napoleon. Dr Volanda Napoleon states to keep a check on pt's BP and if it goes above 130/90 to let office know.

## 2021-04-28 NOTE — Telephone Encounter (Signed)
Erica from [facility] would like a call back to go over patient's med list call back # [phone]

## 2021-04-28 NOTE — Telephone Encounter (Signed)
Called Joey, no answer, left vm that Dr Volanda Napoleon has given VO for the PT.

## 2021-04-28 NOTE — Telephone Encounter (Signed)
Erica from [facility] is calling to go over patient medication list.   Danae Chen could be contacted at [phone].  Please advise.

## 2021-04-29 ENCOUNTER — Telehealth: Payer: Self-pay | Admitting: Family Medicine

## 2021-04-29 NOTE — Telephone Encounter (Signed)
[name] Small is a physical therapist for patient's home health services and called to let the office know that when he took the patient's blood pressure, it was 170/80. He says that the patient did tell him that she has not taken her blood pressure medication for today and has no other symptoms of hypertension.  Patient told [name] Small that she would take her medication after he left and she should be fine.

## 2021-04-29 NOTE — Telephone Encounter (Signed)
Noted  

## 2021-05-03 ENCOUNTER — Telehealth: Payer: Self-pay | Admitting: Family Medicine

## 2021-05-03 NOTE — Telephone Encounter (Signed)
Mallory OT with [facility] is calling and needs verbal orders for this pt to have OT 1x4 and then 1 eow x 4

## 2021-05-04 ENCOUNTER — Ambulatory Visit (INDEPENDENT_AMBULATORY_CARE_PROVIDER_SITE_OTHER): Payer: Medicare Other | Admitting: Family Medicine

## 2021-05-04 VITALS — BP 164/94 | Temp 98.4°F | Wt 109.0 lb

## 2021-05-04 DIAGNOSIS — N1831 Chronic kidney disease, stage 3a: Secondary | ICD-10-CM

## 2021-05-04 DIAGNOSIS — I10 Essential (primary) hypertension: Secondary | ICD-10-CM | POA: Diagnosis not present

## 2021-05-04 DIAGNOSIS — U071 COVID-19: Secondary | ICD-10-CM

## 2021-05-04 DIAGNOSIS — R630 Anorexia: Secondary | ICD-10-CM

## 2021-05-04 DIAGNOSIS — I4891 Unspecified atrial fibrillation: Secondary | ICD-10-CM

## 2021-05-04 LAB — CBC WITH DIFFERENTIAL/PLATELET
Basophils Absolute: 0 10*3/uL (ref 0.0–0.1)
Basophils Relative: 0.5 % (ref 0.0–3.0)
Eosinophils Absolute: 0.1 10*3/uL (ref 0.0–0.7)
Eosinophils Relative: 0.8 % (ref 0.0–5.0)
HCT: 37.9 % (ref 36.0–46.0)
Hemoglobin: 12.3 g/dL (ref 12.0–15.0)
Lymphocytes Relative: 22.8 % (ref 12.0–46.0)
Lymphs Abs: 1.5 10*3/uL (ref 0.7–4.0)
MCHC: 32.5 g/dL (ref 30.0–36.0)
MCV: 93.7 fl (ref 78.0–100.0)
Monocytes Absolute: 0.5 10*3/uL (ref 0.1–1.0)
Monocytes Relative: 7.7 % (ref 3.0–12.0)
Neutro Abs: 4.5 10*3/uL (ref 1.4–7.7)
Neutrophils Relative %: 68.2 % (ref 43.0–77.0)
Platelets: 213 10*3/uL (ref 150.0–400.0)
RBC: 4.04 Mil/uL (ref 3.87–5.11)
RDW: 16.1 % — ABNORMAL HIGH (ref 11.5–15.5)
WBC: 6.5 10*3/uL (ref 4.0–10.5)

## 2021-05-04 LAB — BASIC METABOLIC PANEL
BUN: 12 mg/dL (ref 6–23)
CO2: 25 mEq/L (ref 19–32)
Calcium: 9.8 mg/dL (ref 8.4–10.5)
Chloride: 103 mEq/L (ref 96–112)
Creatinine, Ser: 0.97 mg/dL (ref 0.40–1.20)
GFR: 51.53 mL/min — ABNORMAL LOW (ref >60.00)
Glucose, Bld: 85 mg/dL (ref 70–99)
Potassium: 4.5 mEq/L (ref 3.5–5.1)
Sodium: 139 mEq/L (ref 135–145)

## 2021-05-04 MED ORDER — METOPROLOL TARTRATE 25 MG PO TABS
25.0000 mg | ORAL_TABLET | Freq: Two times a day (BID) | ORAL | 11 refills | Status: DC
Start: 2021-05-04 — End: 2021-06-01

## 2021-05-04 NOTE — Progress Notes (Signed)
Subjective:    Patient ID: HANAN MCWILLIAMS, female    DOB: 09-20-1928, 87 y.o.   MRN: [id number]  Chief Complaint  Patient presents with   [facility] f/u  Pt accompanied by her son.  HPI Patient is a 87 yo female with pmh sig for afib on xarelto, sick sinus syndrome with pacer, renal insufficiency, HTN, and h/o vertebral compression fx s/p vertebroplasty (03/21/21) who was seen today for HFU and rehab f/u.  Pt admitted 11/16-11/21 for witnessed syncopal episode at SNF 2/2 decreased po intake due to COVID 19 infection.  In ED pt with 1s degree AV block on EKG incomplete RBBB, LAFB, CXR neg, CT head and CT cervical spine without acute findings.  IVF and remdesivir started.  Given Keflex at d/c.  Since d/c from rehab, pt has been home 5 days.  Endorses feeling a little better but still with decreased appetite.  Drinking 1 coke, tea, and 1 bottle of water per day.  Pt states she feels cold when drinking water even at room temp.  Pt ambulating with walker.  Denies cough, fever, chills, n/v, CP.  Has not checked bp since being home.  Taking losartan 100 mg daily.  D/c on metoprolol 25 mg BID but has not been taking.  Past Medical History:  Diagnosis Date   Atrial fibrillation ([place])    Bleeding stomach ulcer 1980s?   Cancer ([place]) Meloanoma left leg   DIVERTICULOSIS, COLON 11/22/2006   Headache(784.0)    "very often; not regular" (11/25/2015)   History of blood transfusion 1980s   "when I had the bleeding ulcer"   HYPERLIPIDEMIA 11/22/2006   Hypertension    Menopausal syndrome (hot flashes)    OSTEOPOROSIS 11/22/2006   Presence of permanent cardiac pacemaker     Allergies  Allergen Reactions   Septra Ds [Sulfamethoxazole-Trimethoprim]    Lisinopril Other (See Comments)    Cough    ROS General: Denies fever, chills, night sweats, changes in weight, changes in appetite + decreased appetite, fatigue HEENT: Denies headaches, ear pain, changes in vision, rhinorrhea, sore throat CV: Denies  CP, palpitations, SOB, orthopnea Pulm: Denies SOB, cough, wheezing GI: Denies abdominal pain, nausea, vomiting, diarrhea, constipation GU: Denies dysuria, hematuria, frequency, vaginal discharge Msk: Denies muscle cramps, joint pains Neuro: Denies weakness, numbness, tingling Skin: Denies rashes, bruising Psych: Denies depression, anxiety, hallucinations     Objective:    Blood pressure (!) 164/94, temperature 98.4 F (36.9 C), temperature source Oral, weight 109 lb (49.4 kg), SpO2 90 %.  Gen. Pleasant, well-nourished, in no distress, normal affect   HEENT: [place]/AT, face symmetric, conjunctiva clear, no scleral icterus, PERRLA, EOMI, nares patent without drainage Lungs: no accessory muscle use, CTAB, no wheezes or rales Cardiovascular: RRR, no m/r/g, no peripheral edema Abdomen: BS present, soft, NT/ND. Musculoskeletal: No deformities, no cyanosis or clubbing, normal tone Neuro:  A&Ox3, CN II-XII intact, normal gait Skin:  Warm, no lesions/ rash  Wt Readings from Last 3 Encounters:  05/04/21 109 lb (49.4 kg)  04/11/21 112 lb 10.5 oz (51.1 kg)  03/16/21 119 lb 14.9 oz (54.4 kg)    Lab Results  Component Value Date   WBC 10.5 04/10/2021   HGB 12.2 04/10/2021   HCT 37.1 04/10/2021   PLT 229 04/10/2021   GLUCOSE 94 04/11/2021   CHOL 163 08/01/2017   TRIG 67.0 08/01/2017   HDL 75.50 08/01/2017   LDLDIRECT 176.0 02/24/2008   LDLCALC 74 08/01/2017   ALT 30 04/10/2021  AST 35 04/10/2021   NA 139 04/11/2021   K 4.0 04/11/2021   CL 106 04/11/2021   CREATININE 0.83 04/11/2021   BUN 8 04/11/2021   CO2 23 04/11/2021   TSH 3.350 09/13/2020   INR 1.1 03/17/2021   HGBA1C 6.1 (H) 12/07/2014    Assessment/Plan:  Essential hypertension  -uncontrolled -continue losartan 100 mg daily -restart Metoprolol 25 mg BID -low sodium diet and increase hydration -encouraged to check bp at home and keep a log - Plan: Basic metabolic panel, CBC with Differential/Platelet, metoprolol  tartrate (LOPRESSOR) 25 MG tablet  Stage 3a chronic kidney disease ([place])  -stable, improving -avoid nephrotoxic meds and renally dose meds -hydration - Plan: Basic metabolic panel  Atrial fibrillation with RVR-CHADs VASc=4  -continue xarelto, amiodarone -restart metoprolol - Plan: CBC with Differential/Platelet  COVID-19 virus infection  -resolved - Plan: CBC with Differential/Platelet  Decreased appetite -improving -encourage snacks and regular meals  F/u in 1-2 months, prn  More than 50% of over 40 minutes spent in total in caring for this patient face-to-face, counseling and/or coordinating care.   Grier Mitts, MD

## 2021-05-04 NOTE — Patient Instructions (Addendum)
Continue losartan 100 mg daily.  Restart metoprolol 25 mg twice daily.

## 2021-05-06 NOTE — Telephone Encounter (Signed)
Left detailed VM giving verbal order from Dr Volanda Napoleon for this pt to have OT 1x4 and then 1 eow x 4.

## 2021-05-06 NOTE — Telephone Encounter (Signed)
Ok

## 2021-05-10 ENCOUNTER — Telehealth: Payer: Self-pay | Admitting: Family Medicine

## 2021-05-10 NOTE — Telephone Encounter (Signed)
[name] Small with [facility] called in to let Dr.Banks know that patient had a fall over the weekend. Patient fell in the kitchen on Saturday. She was backed up against the counter and lost balance.   I asked patient if she needed to be seen by Dr.Banks but she declined and stated that she just wanted Dr.Banks to know about her fall.    Please advise.

## 2021-05-12 ENCOUNTER — Telehealth: Payer: Self-pay

## 2021-05-12 DIAGNOSIS — N3281 Overactive bladder: Secondary | ICD-10-CM

## 2021-05-12 NOTE — Telephone Encounter (Signed)
Spoke with Mallory, OT with [facility], stated pt had a fall last night, no injury but has a bruise on buttock. Would like to request a bedside commode for pt to have at night.

## 2021-05-12 NOTE — Telephone Encounter (Signed)
Ok

## 2021-05-13 NOTE — Telephone Encounter (Signed)
Ok

## 2021-05-17 NOTE — Telephone Encounter (Signed)
Order placed for DME, community message sent.

## 2021-05-22 ENCOUNTER — Encounter: Payer: Self-pay | Admitting: Family Medicine

## 2021-05-23 ENCOUNTER — Encounter (HOSPITAL_BASED_OUTPATIENT_CLINIC_OR_DEPARTMENT_OTHER): Payer: Self-pay | Admitting: Obstetrics and Gynecology

## 2021-05-23 ENCOUNTER — Other Ambulatory Visit: Payer: Self-pay

## 2021-05-23 ENCOUNTER — Emergency Department (HOSPITAL_BASED_OUTPATIENT_CLINIC_OR_DEPARTMENT_OTHER)
Admission: EM | Admit: 2021-05-23 | Discharge: 2021-05-23 | Disposition: A | Discharge status: Home / Self Care | Payer: Medicare Other | Attending: Emergency Medicine | Admitting: Emergency Medicine

## 2021-05-23 ENCOUNTER — Emergency Department (HOSPITAL_BASED_OUTPATIENT_CLINIC_OR_DEPARTMENT_OTHER): Payer: Medicare Other

## 2021-05-23 DIAGNOSIS — Z743 Need for continuous supervision: Secondary | ICD-10-CM | POA: Diagnosis not present

## 2021-05-23 DIAGNOSIS — D649 Anemia, unspecified: Secondary | ICD-10-CM | POA: Diagnosis not present

## 2021-05-23 DIAGNOSIS — Z79899 Other long term (current) drug therapy: Secondary | ICD-10-CM | POA: Insufficient documentation

## 2021-05-23 DIAGNOSIS — M546 Pain in thoracic spine: Secondary | ICD-10-CM | POA: Diagnosis not present

## 2021-05-23 DIAGNOSIS — G8929 Other chronic pain: Secondary | ICD-10-CM | POA: Diagnosis not present

## 2021-05-23 DIAGNOSIS — R0902 Hypoxemia: Secondary | ICD-10-CM | POA: Diagnosis not present

## 2021-05-23 DIAGNOSIS — N3000 Acute cystitis without hematuria: Secondary | ICD-10-CM | POA: Diagnosis not present

## 2021-05-23 DIAGNOSIS — R109 Unspecified abdominal pain: Secondary | ICD-10-CM | POA: Diagnosis not present

## 2021-05-23 DIAGNOSIS — M549 Dorsalgia, unspecified: Secondary | ICD-10-CM | POA: Diagnosis not present

## 2021-05-23 DIAGNOSIS — Z7901 Long term (current) use of anticoagulants: Secondary | ICD-10-CM | POA: Diagnosis not present

## 2021-05-23 LAB — COMPREHENSIVE METABOLIC PANEL
ALT: 12 U/L (ref 0–44)
AST: 16 U/L (ref 15–41)
Albumin: 4 g/dL (ref 3.5–5.0)
Alkaline Phosphatase: 115 U/L (ref 38–126)
Anion gap: 7 (ref 5–15)
BUN: 12 mg/dL (ref 8–23)
CO2: 28 mmol/L (ref 22–32)
Calcium: 8.9 mg/dL (ref 8.9–10.3)
Chloride: 105 mmol/L (ref 98–111)
Creatinine, Ser: 0.9 mg/dL (ref 0.44–1.00)
GFR, Estimated: >60 mL/min (ref >60)
Glucose, Bld: 99 mg/dL (ref 70–99)
Potassium: 4 mmol/L (ref 3.5–5.1)
Sodium: 140 mmol/L (ref 135–145)
Total Bilirubin: 0.6 mg/dL (ref 0.3–1.2)
Total Protein: 6.8 g/dL (ref 6.5–8.1)

## 2021-05-23 LAB — CBC WITH DIFFERENTIAL/PLATELET
Abs Immature Granulocytes: 0.02 10*3/uL (ref 0.00–0.07)
Basophils Absolute: 0 10*3/uL (ref 0.0–0.1)
Basophils Relative: 0 %
Eosinophils Absolute: 0.1 10*3/uL (ref 0.0–0.5)
Eosinophils Relative: 1 %
HCT: 29.2 % — ABNORMAL LOW (ref 36.0–46.0)
Hemoglobin: 9.1 g/dL — ABNORMAL LOW (ref 12.0–15.0)
Immature Granulocytes: 0 %
Lymphocytes Relative: 22 %
Lymphs Abs: 1.2 10*3/uL (ref 0.7–4.0)
MCH: 30 pg (ref 26.0–34.0)
MCHC: 31.2 g/dL (ref 30.0–36.0)
MCV: 96.4 fL (ref 80.0–100.0)
Monocytes Absolute: 0.4 10*3/uL (ref 0.1–1.0)
Monocytes Relative: 8 %
Neutro Abs: 3.6 10*3/uL (ref 1.7–7.7)
Neutrophils Relative %: 69 %
Platelets: 149 10*3/uL — ABNORMAL LOW (ref 150–400)
RBC: 3.03 MIL/uL — ABNORMAL LOW (ref 3.87–5.11)
RDW: 14.3 % (ref 11.5–15.5)
WBC: 5.3 10*3/uL (ref 4.0–10.5)
nRBC: 0 % (ref 0.0–0.2)

## 2021-05-23 LAB — URINALYSIS, ROUTINE W REFLEX MICROSCOPIC
Bilirubin Urine: NEGATIVE
Glucose, UA: NEGATIVE mg/dL
Hgb urine dipstick: NEGATIVE
Ketones, ur: NEGATIVE mg/dL
Nitrite: NEGATIVE
Protein, ur: NEGATIVE mg/dL
Specific Gravity, Urine: <1.005 — ABNORMAL LOW (ref 1.005–1.030)
WBC, UA: >50 WBC/hpf — ABNORMAL HIGH (ref 0–5)
pH: 5.5 (ref 5.0–8.0)

## 2021-05-23 LAB — OCCULT BLOOD X 1 CARD TO LAB, STOOL: Fecal Occult Bld: NEGATIVE

## 2021-05-23 MED ORDER — LABETALOL HCL 5 MG/ML IV SOLN
10.0000 mg | Freq: Once | INTRAVENOUS | Status: AC
  Administered 2021-05-23: 10 mg via INTRAVENOUS
  Filled 2021-05-23: qty 4

## 2021-05-23 MED ORDER — CEPHALEXIN 500 MG PO CAPS: 500.0000 mg | ORAL_CAPSULE | Freq: Four times a day (QID) | ORAL | 0 refills | Status: DC

## 2021-05-23 NOTE — ED Triage Notes (Signed)
Pt BIB [facility] from home, pt woke up yesterday am with Right upper back pain, denies any trauma or injuries. States stretching makes it feel better, no relief with OTC Tylenol.  BP 142/98 HR 68 RR 16 96% RA

## 2021-05-23 NOTE — Discharge Instructions (Signed)
Please read and follow all provided instructions.  Your diagnoses today include:  1. Chronic right-sided thoracic back pain   2. Acute cystitis without hematuria   3. Anemia, unspecified type     Tests performed today include: Complete blood cell count: Shows that your hemoglobin is lower today than it was several weeks ago.  You will need to have this rechecked by your doctor in the next week Stool testing for blood: Was negative Basic metabolic panel:  Urinalysis (urine test): Shows urine infection CT scan: No new findings in the abdomen or the spine Vital signs. See below for your results today.   Medications prescribed:  Keflex (cephalexin) - antibiotic  You have been prescribed an antibiotic medicine: take the entire course of medicine even if you are feeling better. Stopping early can cause the antibiotic not to work.  Take any prescribed medications only as directed.  Home care instructions:  Follow any educational materials contained in this packet.  BE VERY CAREFUL not to take multiple medicines containing Tylenol (also called acetaminophen). Doing so can lead to an overdose which can damage your liver and cause liver failure and possibly death.   Follow-up instructions: Please follow-up with your primary care provider in the next 3 days for further evaluation of your symptoms.   Return instructions:  Please return to the Emergency Department if you experience worsening symptoms.  Please return if you have any other emergent concerns.  Additional Information:  Your vital signs today were: BP (!) 175/57 (BP Location: Right Arm)    Pulse (!) 59    Temp 97.8 F (36.6 C)    Resp 16    SpO2 100%  If your blood pressure (BP) was elevated above 135/85 this visit, please have this repeated by your doctor within one month. --------------

## 2021-05-23 NOTE — ED Provider Notes (Signed)
[facility] EMERGENCY DEPT Provider Note   CSN: [id number] Arrival date & time: 05/23/21  1049     History  Chief Complaint  Patient presents with   Back Pain    [name] Small is a 88 y.o. female.  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  States that she woke with pain.  No injury.  Patient states that the pain feels better when she is lying flat but hurts a lot more when she is sitting up.  She is taking Tylenol for pain without improvement.  No weakness, numbness or tingling in the arms or legs.  No urinary symptoms.  No chest pain or shortness of breath.  Onset of symptoms acute.        Home Medications Prior to Admission medications   Medication Sig Start Date End Date Taking? Authorizing Provider  acetaminophen (TYLENOL) 500 MG tablet Take 1 tablet (500 mg total) by mouth every 6 (six) hours as needed. 03/07/21   Varney Biles, MD  amiodarone (PACERONE) 200 MG tablet TAKE 1 TABLET(200 MG) BY MOUTH DAILY Patient taking differently: Take 200 mg by mouth daily with lunch. 02/07/21   Josue Hector, MD  atorvastatin (LIPITOR) 40 MG tablet TAKE 1 TABLET(40 MG) BY MOUTH DAILY Patient taking differently: Take 40 mg by mouth daily with lunch. 02/11/21   Billie Ruddy, MD  atorvastatin (LIPITOR) 80 MG tablet Take 80 mg by mouth daily. 03/23/21   [provider]  calcitonin, salmon, (MIACALCIN) 200 UNIT/ACT nasal spray Place 1 spray into alternate nostrils daily. Patient not taking: Reported on 04/06/2021 03/11/21   Billie Ruddy, MD  docusate sodium (COLACE) 100 MG capsule Take 1 capsule (100 mg total) by mouth 2 (two) times daily. 03/22/21   Barb Merino, MD  losartan (COZAAR) 100 MG tablet Take 1 tablet (100 mg total) by mouth daily. 04/12/21   Antonieta Pert, MD  metoprolol tartrate (LOPRESSOR) 25 MG tablet Take 1 tablet (25 mg total) by mouth 2 (two) times daily. 05/04/21   Billie Ruddy, MD  ondansetron (ZOFRAN) 4 MG tablet Take 1 tablet (4 mg total) by mouth daily as needed for nausea or vomiting. Patient not taking: Reported on 05/04/2021 04/11/21 04/11/22  Antonieta Pert, MD  oxyCODONE (OXY IR/ROXICODONE) 5 MG immediate release tablet Take 1 tablet (5 mg total) by mouth every 4 (four) hours as needed for severe pain. Patient not taking: Reported on 05/04/2021 03/22/21   Barb Merino, MD  pantoprazole (PROTONIX) 40 MG tablet Take 1 tablet (40 mg total) by mouth daily. 04/11/21 05/11/21  Antonieta Pert, MD  Polyvinyl Alcohol-Povidone (REFRESH OP) Place 1 drop into both eyes daily.    [provider]  XARELTO 15 MG TABS tablet TAKE 1 TABLET(15 MG) BY MOUTH DAILY WITH DINNER Patient taking differently: Take 15 mg by mouth daily with lunch. 09/27/20   Josue Hector, MD      Allergies    Septra ds [sulfamethoxazole-trimethoprim] and Lisinopril    Review of Systems   Review of Systems  Constitutional:  Negative for fever.  HENT:  Negative for rhinorrhea and sore throat.   Eyes:  Negative for redness.  Respiratory:  Negative for cough.   Cardiovascular:  Negative for chest pain.  Gastrointestinal:  Negative for  abdominal pain, diarrhea, nausea and vomiting.  Genitourinary:  Negative for dysuria, frequency, hematuria and urgency.  Musculoskeletal:  Positive for back pain and myalgias.  Skin:  Negative for rash.  Neurological:  Negative for headaches.   Physical Exam Updated Vital Signs BP (!) 204/73 (BP Location: Right Arm)    Pulse 64    Temp 97.8 F (36.6 C)    Resp 16    SpO2 100%  Physical Exam Vitals and nursing note reviewed.  Constitutional:      General: She is not in acute distress.    Appearance: She is  well-developed.  HENT:     Head: Normocephalic and atraumatic.     Right Ear: External ear normal.     Left Ear: External ear normal.     Nose: Nose normal.  Eyes:     Conjunctiva/sclera: Conjunctivae normal.  Cardiovascular:     Rate and Rhythm: Normal rate and regular rhythm.     Heart sounds: No murmur heard. Pulmonary:     Effort: No respiratory distress.     Breath sounds: No wheezing, rhonchi or rales.  Abdominal:     Palpations: Abdomen is soft.     Tenderness: There is no abdominal tenderness. There is no guarding or rebound.  Musculoskeletal:     Cervical back: Normal range of motion and neck supple. No spasms or tenderness. Normal range of motion.     Thoracic back: Tenderness present. No bony tenderness.     Lumbar back: No tenderness or bony tenderness.       Back:     Right lower leg: No edema.     Left lower leg: No edema.  Skin:    General: Skin is warm and dry.     Findings: No rash.  Neurological:     General: No focal deficit present.     Mental Status: She is alert. Mental status is at baseline.     Motor: No weakness.  Psychiatric:        Mood and Affect: Mood normal.    ED Results / Procedures / Treatments   Labs (all labs ordered are listed, but only abnormal results are displayed) Labs Reviewed  CBC WITH DIFFERENTIAL/PLATELET - Abnormal; Notable for the following components:      Result Value   RBC 3.03 (*)    Hemoglobin 9.1 (*)    HCT 29.2 (*)    Platelets 149 (*)    All other components within normal limits  URINALYSIS, ROUTINE W REFLEX MICROSCOPIC - Abnormal; Notable for the following components:   APPearance HAZY (*)    Specific Gravity, Urine <1.005 (*)    Leukocytes,Ua LARGE (*)    WBC, UA >50 (*)    Bacteria, UA MANY (*)    All other components within normal limits  URINE CULTURE  COMPREHENSIVE METABOLIC PANEL  OCCULT BLOOD X 1 CARD TO LAB, STOOL    EKG None  Radiology CT Renal Stone Study  Result Date: 05/23/2021 CLINICAL  DATA:  88 year old female with history of flank pain. EXAM: CT ABDOMEN AND PELVIS WITHOUT CONTRAST TECHNIQUE: Multidetector CT imaging of the abdomen and pelvis was performed following the standard protocol without IV contrast. COMPARISON:  CT the abdomen and pelvis 02/13/2020. FINDINGS: Lower chest: Mild scarring in the lung bases bilaterally. Atherosclerotic calcifications are noted in the descending thoracic aorta as well as the left anterior descending coronary artery. Pacemaker leads terminating in the right atrium and right ventricle. Hepatobiliary: No definite suspicious cystic or  solid hepatic lesions are confidently identified on today's noncontrast CT examination. Status post cholecystectomy. Pancreas: No definite pancreatic mass or peripancreatic fluid collections or inflammatory changes are noted on today's noncontrast CT examination. Spleen: Unremarkable. Adrenals/Urinary Tract: No calcifications are identified within the collecting system of either kidney, along the course of either ureter, or within the lumen of the urinary bladder. 3.1 x 2.4 cm low-attenuation lesion in the interpolar region of the right kidney, incompletely characterized on today's non-contrast CT examination, but statistically likely to represent a cyst. Left kidney and bilateral adrenal glands are normal in appearance. No hydroureteronephrosis. Tiny amount of gas noted in the lumen of the urinary bladder. Urinary bladder is otherwise normal in appearance. Stomach/Bowel: The appearance of the stomach is normal. No pathologic dilatation of small bowel or colon. The appendix is not confidently identified and may be surgically absent. Regardless, there are no inflammatory changes noted adjacent to the cecum to suggest the presence of an acute appendicitis at this time. Vascular/Lymphatic: Aortic atherosclerosis. No lymphadenopathy noted in the abdomen or pelvis. Reproductive: Unenhanced appearance of the uterus and ovaries is  unremarkable. Other: No significant volume of ascites.  No pneumoperitoneum. Musculoskeletal: Chronic compression fracture of L2 with 30% loss of anterior vertebral body height and post vertebroplasty changes. Status post ORIF in the left hip. There are no aggressive appearing lytic or blastic lesions noted in the visualized portions of the skeleton. IMPRESSION: 1. Small amount of gas non dependently in the lumen of the urinary bladder. This could be iatrogenic if there has been catheterization for recent urinalysis. In the absence of a history of recent catheterization, however, correlation with urinalysis would be recommended to exclude the possibility of urinary tract infection from gas-forming organisms. 2. No other acute findings are noted elsewhere in the abdomen or pelvis to account for the patient's symptoms. 3. Aortic atherosclerosis. 4. Additional incidental findings, as above. Electronically Signed   By: Vinnie Langton M.D.   On: 05/23/2021 15:02    Procedures Procedures    Medications Ordered in ED Medications  labetalol (NORMODYNE) injection 10 mg (10 mg Intravenous Given 05/23/21 1551)    ED Course/ Medical Decision Making/ A&P    Patient seen and examined. Plan discussed with patient.   Labs: CBC, CMP, UA  Imaging: CT renal  Medications/Fluids: will consider pain medication, currently patient OK lying still  Vital signs reviewed and are as follows: BP (!) 204/73 (BP Location: Right Arm)    Pulse 64    Temp 97.8 F (36.6 C)    Resp 16    SpO2 100%   Initial impression: MSK pain, h/o thoracic compression fracture  2:44 PM UA suggestive of UTI.  She had UTI in November, E. coli, resistant to Cipro and ampicillin.  Waiting results of testing.  Blood pressure is elevated, will give dose of IV labetalol.  4:32 PM Reassessment performed. Patient appears comfortable.   Reviewed pertinent lab work with patient at bedside including downtrending hemoglobin, reassuring CT imaging,  UA consistent with UTI.  Vital signs reviewed and are as follows: BP (!) 133/50 (BP Location: Right Arm)    Pulse (!) 59    Temp 97.8 F (36.6 C)    Resp 16    SpO2 100%   Plan: We will need to check Hemoccult given anticoagulation use.  Patient denies any bloody or black stools.  5:46 PM On re-exam patient appears comfortable.  She was able to roll onto her side for Hemoccult exam without any issues.  Discussed pertinent results with patient at bedside. This included negative Hemoccult. They are comfortable with discharge at this time.   Home treatment: Prescription written for Keflex.  This is what she was treated with last time.  Counseled to use tylenol and ibuprofen for supportive treatment for back pain.  Follow-up: Will need follow-up with PCP to address ongoing pain, hypertension, UTI to ensure resolution of symptoms and follow-up on urine culture, repeat hemoglobin for recent downtrending of hemoglobin without rectal bleeding today.  The patient was urged to return to the Emergency Department immediately with worsening of current symptoms, worsening abdominal pain, persistent vomiting, blood noted in stools, fever, or any other concerns. The patient verbalized understanding.                             Medical Decision Making  Abdominal pain flank and back pain: Patient with abdominal pain. Vitals are stable, no fever. Labs with normal white blood cell count, recent downtrending hemoglobin to 9.1 today with negative Hemoccult and no other signs of active bleeding, UA suggestive of UTI. Imaging CT imaging without acute findings. No signs of dehydration, patient is tolerating PO's. Lungs are clear and no signs suggestive of PNA. Low concern for appendicitis, cholecystitis, pancreatitis, ruptured viscus, UTI, kidney stone, aortic dissection, aortic aneurysm or other emergent abdominal etiology. Supportive therapy indicated with return if symptoms worsen.   Hypertension: We will have  patient continue home meds.  She was treated with IV labetalol here with good improvement.         Final Clinical Impression(s) / ED Diagnoses Final diagnoses:  Chronic right-sided thoracic back pain  Acute cystitis without hematuria  Anemia, unspecified type    Rx / DC Orders ED Discharge Orders          Ordered    cephALEXin (KEFLEX) 500 MG capsule  4 times daily        05/23/21 1738              Carlisle Cater, PA-C 05/23/21 1748    Regan Lemming, MD 05/23/21 [phone]

## 2021-05-23 NOTE — ED Notes (Signed)
Pt is ambulatory to restroom without any complaints.

## 2021-05-24 ENCOUNTER — Telehealth: Payer: Self-pay

## 2021-05-24 ENCOUNTER — Ambulatory Visit: Payer: Medicare Other

## 2021-05-24 NOTE — Telephone Encounter (Signed)
Patient contacted on preferred number listed in notes for scheduled AWV. Patient stated unable to complete in process visit due to hearing difficulties. Spoke with caregiver who advised to reschedule with son.

## 2021-05-24 NOTE — Telephone Encounter (Signed)
Patient was contacted on preferred number listed in notes for scheduled AWV. Patient unable to complete in process visit. Spoke with patient Caregiver who  stated to reschedule visit with patients son.

## 2021-05-25 ENCOUNTER — Telehealth: Payer: Self-pay | Admitting: Family Medicine

## 2021-05-25 DIAGNOSIS — E860 Dehydration: Secondary | ICD-10-CM | POA: Diagnosis not present

## 2021-05-25 DIAGNOSIS — G8929 Other chronic pain: Secondary | ICD-10-CM | POA: Diagnosis not present

## 2021-05-25 DIAGNOSIS — Z9181 History of falling: Secondary | ICD-10-CM | POA: Diagnosis not present

## 2021-05-25 DIAGNOSIS — M818 Other osteoporosis without current pathological fracture: Secondary | ICD-10-CM | POA: Diagnosis not present

## 2021-05-25 DIAGNOSIS — Z8616 Personal history of COVID-19: Secondary | ICD-10-CM | POA: Diagnosis not present

## 2021-05-25 DIAGNOSIS — Z7901 Long term (current) use of anticoagulants: Secondary | ICD-10-CM | POA: Diagnosis not present

## 2021-05-25 DIAGNOSIS — Z961 Presence of intraocular lens: Secondary | ICD-10-CM | POA: Diagnosis not present

## 2021-05-25 DIAGNOSIS — K5710 Diverticulosis of small intestine without perforation or abscess without bleeding: Secondary | ICD-10-CM | POA: Diagnosis not present

## 2021-05-25 DIAGNOSIS — I480 Paroxysmal atrial fibrillation: Secondary | ICD-10-CM | POA: Diagnosis not present

## 2021-05-25 DIAGNOSIS — I495 Sick sinus syndrome: Secondary | ICD-10-CM | POA: Diagnosis not present

## 2021-05-25 DIAGNOSIS — Z950 Presence of cardiac pacemaker: Secondary | ICD-10-CM | POA: Diagnosis not present

## 2021-05-25 DIAGNOSIS — E039 Hypothyroidism, unspecified: Secondary | ICD-10-CM | POA: Diagnosis not present

## 2021-05-25 DIAGNOSIS — N390 Urinary tract infection, site not specified: Secondary | ICD-10-CM | POA: Diagnosis not present

## 2021-05-25 DIAGNOSIS — I129 Hypertensive chronic kidney disease with stage 1 through stage 4 chronic kidney disease, or unspecified chronic kidney disease: Secondary | ICD-10-CM | POA: Diagnosis not present

## 2021-05-25 DIAGNOSIS — S32020D Wedge compression fracture of second lumbar vertebra, subsequent encounter for fracture with routine healing: Secondary | ICD-10-CM | POA: Diagnosis not present

## 2021-05-25 DIAGNOSIS — N1832 Chronic kidney disease, stage 3b: Secondary | ICD-10-CM | POA: Diagnosis not present

## 2021-05-25 LAB — URINE CULTURE: Culture: >=100000 — AB

## 2021-05-25 NOTE — Telephone Encounter (Signed)
[name] Small PT [facility] is calling and would like verbal order for PT 2x4  starting next week. Pt balance and walking is still off

## 2021-05-26 ENCOUNTER — Telehealth: Payer: Self-pay | Admitting: Family Medicine

## 2021-05-26 DIAGNOSIS — Z961 Presence of intraocular lens: Secondary | ICD-10-CM | POA: Diagnosis not present

## 2021-05-26 DIAGNOSIS — Z7901 Long term (current) use of anticoagulants: Secondary | ICD-10-CM | POA: Diagnosis not present

## 2021-05-26 DIAGNOSIS — Z950 Presence of cardiac pacemaker: Secondary | ICD-10-CM | POA: Diagnosis not present

## 2021-05-26 DIAGNOSIS — I495 Sick sinus syndrome: Secondary | ICD-10-CM | POA: Diagnosis not present

## 2021-05-26 DIAGNOSIS — N1832 Chronic kidney disease, stage 3b: Secondary | ICD-10-CM | POA: Diagnosis not present

## 2021-05-26 DIAGNOSIS — M818 Other osteoporosis without current pathological fracture: Secondary | ICD-10-CM | POA: Diagnosis not present

## 2021-05-26 DIAGNOSIS — I129 Hypertensive chronic kidney disease with stage 1 through stage 4 chronic kidney disease, or unspecified chronic kidney disease: Secondary | ICD-10-CM | POA: Diagnosis not present

## 2021-05-26 DIAGNOSIS — I480 Paroxysmal atrial fibrillation: Secondary | ICD-10-CM | POA: Diagnosis not present

## 2021-05-26 DIAGNOSIS — E039 Hypothyroidism, unspecified: Secondary | ICD-10-CM | POA: Diagnosis not present

## 2021-05-26 DIAGNOSIS — N390 Urinary tract infection, site not specified: Secondary | ICD-10-CM | POA: Diagnosis not present

## 2021-05-26 DIAGNOSIS — K5710 Diverticulosis of small intestine without perforation or abscess without bleeding: Secondary | ICD-10-CM | POA: Diagnosis not present

## 2021-05-26 DIAGNOSIS — S32020D Wedge compression fracture of second lumbar vertebra, subsequent encounter for fracture with routine healing: Secondary | ICD-10-CM | POA: Diagnosis not present

## 2021-05-26 DIAGNOSIS — G8929 Other chronic pain: Secondary | ICD-10-CM | POA: Diagnosis not present

## 2021-05-26 DIAGNOSIS — Z9181 History of falling: Secondary | ICD-10-CM | POA: Diagnosis not present

## 2021-05-26 DIAGNOSIS — Z8616 Personal history of COVID-19: Secondary | ICD-10-CM | POA: Diagnosis not present

## 2021-05-26 DIAGNOSIS — E860 Dehydration: Secondary | ICD-10-CM | POA: Diagnosis not present

## 2021-05-26 NOTE — Telephone Encounter (Signed)
Post ED Visit - Positive Culture Follow-up  Culture report reviewed by antimicrobial stewardship pharmacist: [place] Team []  Elenor Quinones, Pharm.D. [x]  Heide Guile, Pharm.D., BCPS AQ-ID []  Parks Neptune, Pharm.D., BCPS []  Alycia Rossetti, Pharm.D., BCPS []  [place], [place].D., BCPS, AAHIVP []  Legrand Como, Pharm.D., BCPS, AAHIVP []  Salome Arnt, PharmD, BCPS []  Johnnette Gourd, PharmD, BCPS []  Hughes Better, PharmD, BCPS []  Leeroy Cha, PharmD []  Laqueta Linden, PharmD, BCPS []  Albertina Parr, PharmD  [place] Team []  Leodis Sias, PharmD []  Lindell Spar, PharmD []  Royetta Asal, PharmD []  Graylin Shiver, Rph []  Rema Fendt) Glennon Mac, PharmD []  Arlyn Dunning, PharmD []  [name], PharmD []  Dia Sitter, PharmD []  Leone Haven, PharmD []  Gretta Arab, PharmD []  Theodis Shove, PharmD []  Peggyann Juba, PharmD []  Reuel Boom, PharmD   Positive urine culture Treated with Cephalexin, organism sensitive to the same and no further patient follow-up is required at this time.  [name] Small 05/26/2021, 11:36 AM

## 2021-05-26 NOTE — Telephone Encounter (Signed)
Ok

## 2021-05-26 NOTE — Telephone Encounter (Signed)
Malori from home health [phone] Called in to give vitals for this patient for today BP 162/80 slightly elevated, and Malori stated that patient is asystematic

## 2021-05-26 NOTE — Telephone Encounter (Signed)
Per Dr Volanda Napoleon, gave verbal orders for pt.

## 2021-05-30 ENCOUNTER — Telehealth: Payer: Self-pay | Admitting: Family Medicine

## 2021-05-30 DIAGNOSIS — I129 Hypertensive chronic kidney disease with stage 1 through stage 4 chronic kidney disease, or unspecified chronic kidney disease: Secondary | ICD-10-CM | POA: Diagnosis not present

## 2021-05-30 DIAGNOSIS — K5710 Diverticulosis of small intestine without perforation or abscess without bleeding: Secondary | ICD-10-CM | POA: Diagnosis not present

## 2021-05-30 DIAGNOSIS — Z961 Presence of intraocular lens: Secondary | ICD-10-CM | POA: Diagnosis not present

## 2021-05-30 DIAGNOSIS — I495 Sick sinus syndrome: Secondary | ICD-10-CM | POA: Diagnosis not present

## 2021-05-30 DIAGNOSIS — Z8616 Personal history of COVID-19: Secondary | ICD-10-CM | POA: Diagnosis not present

## 2021-05-30 DIAGNOSIS — G8929 Other chronic pain: Secondary | ICD-10-CM | POA: Diagnosis not present

## 2021-05-30 DIAGNOSIS — E039 Hypothyroidism, unspecified: Secondary | ICD-10-CM | POA: Diagnosis not present

## 2021-05-30 DIAGNOSIS — N390 Urinary tract infection, site not specified: Secondary | ICD-10-CM | POA: Diagnosis not present

## 2021-05-30 DIAGNOSIS — Z7901 Long term (current) use of anticoagulants: Secondary | ICD-10-CM | POA: Diagnosis not present

## 2021-05-30 DIAGNOSIS — I480 Paroxysmal atrial fibrillation: Secondary | ICD-10-CM | POA: Diagnosis not present

## 2021-05-30 DIAGNOSIS — M818 Other osteoporosis without current pathological fracture: Secondary | ICD-10-CM | POA: Diagnosis not present

## 2021-05-30 DIAGNOSIS — Z9181 History of falling: Secondary | ICD-10-CM | POA: Diagnosis not present

## 2021-05-30 DIAGNOSIS — E860 Dehydration: Secondary | ICD-10-CM | POA: Diagnosis not present

## 2021-05-30 DIAGNOSIS — S32020D Wedge compression fracture of second lumbar vertebra, subsequent encounter for fracture with routine healing: Secondary | ICD-10-CM | POA: Diagnosis not present

## 2021-05-30 DIAGNOSIS — Z950 Presence of cardiac pacemaker: Secondary | ICD-10-CM | POA: Diagnosis not present

## 2021-05-30 DIAGNOSIS — N1832 Chronic kidney disease, stage 3b: Secondary | ICD-10-CM | POA: Diagnosis not present

## 2021-05-30 NOTE — Telephone Encounter (Signed)
[name] from [facility] call and stated he want dr.Banks to know pt bp was up this morning 170/80. Brandon's # is [phone] he also stated it is never up with him.

## 2021-05-30 NOTE — Telephone Encounter (Signed)
Has appt with you on 1/11

## 2021-05-31 ENCOUNTER — Telehealth: Payer: Self-pay | Admitting: Family Medicine

## 2021-05-31 NOTE — Telephone Encounter (Signed)
Orders needed to extend her home health OT by 2 visits

## 2021-06-01 ENCOUNTER — Ambulatory Visit (INDEPENDENT_AMBULATORY_CARE_PROVIDER_SITE_OTHER): Payer: Medicare Other | Admitting: Family Medicine

## 2021-06-01 ENCOUNTER — Encounter: Payer: Self-pay | Admitting: Family Medicine

## 2021-06-01 VITALS — BP 130/88 | HR 85 | Temp 97.8°F | Wt 110.2 lb

## 2021-06-01 DIAGNOSIS — Z8616 Personal history of COVID-19: Secondary | ICD-10-CM | POA: Diagnosis not present

## 2021-06-01 DIAGNOSIS — I495 Sick sinus syndrome: Secondary | ICD-10-CM | POA: Diagnosis not present

## 2021-06-01 DIAGNOSIS — Z9181 History of falling: Secondary | ICD-10-CM | POA: Diagnosis not present

## 2021-06-01 DIAGNOSIS — D649 Anemia, unspecified: Secondary | ICD-10-CM

## 2021-06-01 DIAGNOSIS — N3000 Acute cystitis without hematuria: Secondary | ICD-10-CM

## 2021-06-01 DIAGNOSIS — Z961 Presence of intraocular lens: Secondary | ICD-10-CM | POA: Diagnosis not present

## 2021-06-01 DIAGNOSIS — N390 Urinary tract infection, site not specified: Secondary | ICD-10-CM | POA: Diagnosis not present

## 2021-06-01 DIAGNOSIS — Z7901 Long term (current) use of anticoagulants: Secondary | ICD-10-CM | POA: Diagnosis not present

## 2021-06-01 DIAGNOSIS — N182 Chronic kidney disease, stage 2 (mild): Secondary | ICD-10-CM

## 2021-06-01 DIAGNOSIS — M818 Other osteoporosis without current pathological fracture: Secondary | ICD-10-CM | POA: Diagnosis not present

## 2021-06-01 DIAGNOSIS — I129 Hypertensive chronic kidney disease with stage 1 through stage 4 chronic kidney disease, or unspecified chronic kidney disease: Secondary | ICD-10-CM | POA: Diagnosis not present

## 2021-06-01 DIAGNOSIS — E039 Hypothyroidism, unspecified: Secondary | ICD-10-CM | POA: Diagnosis not present

## 2021-06-01 DIAGNOSIS — I10 Essential (primary) hypertension: Secondary | ICD-10-CM | POA: Diagnosis not present

## 2021-06-01 DIAGNOSIS — K5710 Diverticulosis of small intestine without perforation or abscess without bleeding: Secondary | ICD-10-CM | POA: Diagnosis not present

## 2021-06-01 DIAGNOSIS — E782 Mixed hyperlipidemia: Secondary | ICD-10-CM | POA: Diagnosis not present

## 2021-06-01 DIAGNOSIS — S32020D Wedge compression fracture of second lumbar vertebra, subsequent encounter for fracture with routine healing: Secondary | ICD-10-CM | POA: Diagnosis not present

## 2021-06-01 DIAGNOSIS — I480 Paroxysmal atrial fibrillation: Secondary | ICD-10-CM | POA: Diagnosis not present

## 2021-06-01 DIAGNOSIS — E860 Dehydration: Secondary | ICD-10-CM | POA: Diagnosis not present

## 2021-06-01 DIAGNOSIS — Z950 Presence of cardiac pacemaker: Secondary | ICD-10-CM | POA: Diagnosis not present

## 2021-06-01 DIAGNOSIS — N1832 Chronic kidney disease, stage 3b: Secondary | ICD-10-CM | POA: Diagnosis not present

## 2021-06-01 DIAGNOSIS — G8929 Other chronic pain: Secondary | ICD-10-CM | POA: Diagnosis not present

## 2021-06-01 LAB — POCT URINALYSIS DIPSTICK
Bilirubin, UA: NEGATIVE
Blood, UA: NEGATIVE
Glucose, UA: NEGATIVE
Ketones, UA: NEGATIVE
Nitrite, UA: NEGATIVE
Protein, UA: POSITIVE — AB
Spec Grav, UA: 1.025 (ref 1.010–1.025)
Urobilinogen, UA: NEGATIVE E.U./dL — AB
pH, UA: 6 (ref 5.0–8.0)

## 2021-06-01 LAB — CBC WITH DIFFERENTIAL/PLATELET
Basophils Absolute: 0 10*3/uL (ref 0.0–0.1)
Basophils Relative: 0.7 % (ref 0.0–3.0)
Eosinophils Absolute: 0.1 10*3/uL (ref 0.0–0.7)
Eosinophils Relative: 0.9 % (ref 0.0–5.0)
HCT: 41.5 % (ref 36.0–46.0)
Hemoglobin: 13.3 g/dL (ref 12.0–15.0)
Lymphocytes Relative: 22.2 % (ref 12.0–46.0)
Lymphs Abs: 1.5 10*3/uL (ref 0.7–4.0)
MCHC: 31.9 g/dL (ref 30.0–36.0)
MCV: 92.9 fl (ref 78.0–100.0)
Monocytes Absolute: 0.6 10*3/uL (ref 0.1–1.0)
Monocytes Relative: 8.3 % (ref 3.0–12.0)
Neutro Abs: 4.6 10*3/uL (ref 1.4–7.7)
Neutrophils Relative %: 67.9 % (ref 43.0–77.0)
Platelets: 219 10*3/uL (ref 150.0–400.0)
RBC: 4.47 Mil/uL (ref 3.87–5.11)
RDW: 15.8 % — ABNORMAL HIGH (ref 11.5–15.5)
WBC: 6.7 10*3/uL (ref 4.0–10.5)

## 2021-06-01 MED ORDER — METOPROLOL TARTRATE 25 MG PO TABS: 37.5000 mg | ORAL_TABLET | Freq: Two times a day (BID) | ORAL | 3 refills | Status: DC

## 2021-06-01 NOTE — Progress Notes (Signed)
Subjective:    Patient ID: [name] Small, female    DOB: [birth year], 88 y.o.   MRN: [id number]  Chief Complaint  Patient presents with   Follow-up  Patient accompanied by her son.  HPI Patient was seen today for ED follow-up.  Patient seen in ED on 05/23/2021 for chronic right-sided thoracic back pain, acute cystitis, and anemia.  H/H 9.1/29.2 in ED with plts 149.  Hemoccult negative in ED.  Patient started on Keflex 4 times daily.  Currently asymptomatic.  May have 8 oz of water per day.  Drinking mostly coffee, tea, juice.  Had wellness screen this am at home through her insurance company.  Pt had a fall at home, slid off bed to floor.  Using rolling walker to aid with ambulation.  At times tries to move faster than she should.  Past Medical History:  Diagnosis Date   Atrial fibrillation ([place])    Bleeding stomach ulcer 1980s?   Cancer ([place]) Meloanoma left leg   DIVERTICULOSIS, COLON 11/22/2006   Headache(784.0)    "very often; not regular" (11/25/2015)   History of blood transfusion 1980s   "when I had the bleeding ulcer"   HYPERLIPIDEMIA 11/22/2006   Hypertension    Menopausal syndrome (hot flashes)    OSTEOPOROSIS 11/22/2006   Presence of permanent cardiac pacemaker     Allergies  Allergen Reactions   Septra Ds [Sulfamethoxazole-Trimethoprim]    Lisinopril Other (See Comments)    Cough    ROS General: Denies fever, chills, night sweats, changes in weight, changes in appetite  +falls HEENT: Denies headaches, ear pain, changes in vision, rhinorrhea, sore throat CV: Denies CP, palpitations, SOB, orthopnea Pulm: Denies SOB, cough, wheezing GI: Denies abdominal pain, nausea, vomiting, diarrhea, constipation GU: Denies dysuria, hematuria, frequency, vaginal discharge Msk: Denies muscle cramps, joint pains Neuro: Denies weakness, numbness, tingling Skin: Denies rashes, bruising Psych: Denies depression, anxiety, hallucinations    Objective:    Blood pressure 130/88, pulse 85,  temperature 97.8 F (36.6 C), temperature source Oral, weight 110 lb 3.2 oz (50 kg), SpO2 97 %.  Gen. Pleasant, thin, in no distress, normal affect   HEENT: [place]/AT, face symmetric, conjunctiva clear, no scleral icterus, PERRLA, EOMI, nares patent without drainage Lungs: no accessory muscle use, CTAB, no wheezes or rales Cardiovascular: RRR, no m/r/g, no peripheral edema Abdomen: BS present, soft, NT/ND, no hepatosplenomegaly. Musculoskeletal: No deformities, no cyanosis or clubbing, normal tone Neuro:  A&Ox3, CN II-XII intact, using walker to aid with ambulation Skin:  Warm, no lesions/ rash   Wt Readings from Last 3 Encounters:  06/01/21 110 lb 3.2 oz (50 kg)  05/04/21 109 lb (49.4 kg)  04/11/21 112 lb 10.5 oz (51.1 kg)    Lab Results  Component Value Date   WBC 5.3 05/23/2021   HGB 9.1 (L) 05/23/2021   HCT 29.2 (L) 05/23/2021   PLT 149 (L) 05/23/2021   GLUCOSE 99 05/23/2021   CHOL 163 08/01/2017   TRIG 67.0 08/01/2017   HDL 75.50 08/01/2017   LDLDIRECT 176.0 02/24/2008   LDLCALC 74 08/01/2017   ALT 12 05/23/2021   AST 16 05/23/2021   NA 140 05/23/2021   K 4.0 05/23/2021   CL 105 05/23/2021   CREATININE 0.90 05/23/2021   BUN 12 05/23/2021   CO2 28 05/23/2021   TSH 3.350 09/13/2020   INR 1.1 03/17/2021   HGBA1C 6.1 (H) 12/07/2014    Assessment/Plan:  Acute cystitis without hematuria  -Urine culture in ED on 05/23/2021 with a >  100K E. coli resistant to ampicillin and Cipro -Completed course of Keflex -UA this visit with 1+ leuks, protein, negative for RBCs -Discussed increasing p.o. intake of water - Plan: POCT urinalysis dipstick, Culture, Urine  Anemia, unspecified type -H/H 9.1/29.2, 05/23/2021.  Baseline H/H around 12-13/37.9-39 -Hemoccult negative in ED -Monitor for s/s of bleeding - Plan: CBC with Differential/Platelet, Iron, TIBC and Ferritin Panel  Essential hypertension  -Improving -Elevated BPs at home per home health up to 170s -We will increase  metoprolol from 25 mg twice daily to 37.5 mg twice daily -Continue losartan 100 mg daily - Plan: metoprolol tartrate (LOPRESSOR) 25 MG tablet  HLD, mixed -Continue Lipitor 40 mg daily -Continue lifestyle modifications  Chronic kidney disease stage II -Improving previously CKD 3a -GFR 51.53 on 05/04/2021 -Continue to monitor  F/u as needed in 1 month  More than 50% of over 33-35 minutes spent in total in caring for this patient was spent face-to-face, reviewing the chart, counseling and/or coordinating care.   Grier Mitts, MD

## 2021-06-01 NOTE — Telephone Encounter (Signed)
Malori followed up on verbal orders to extend home health OT by two visits. I let her know that message had been routed back to Dr.Banks     Good callback number is [phone] Faythe Ghee to leave voicemail     Please advise

## 2021-06-01 NOTE — Patient Instructions (Signed)
At today's visit we discussed increasing your metoprolol from 25 mg twice a day to 37.5 mg twice a day.  A new prescription for the increased dose was sent to your pharmacy.  This means you will take 1-1/2 tablets twice a day in addition to your other medications for blood pressure.  Continue to check your blood pressure at home and keep a log to bring with you to your next office visit.  Continue to try increasing her intake of water in addition to the other fluids you are drinking.  This visit to monitor your hemoglobin to see if you are still anemic.  We will also check your iron levels to see if these are low and causing your symptoms.

## 2021-06-02 LAB — URINE CULTURE
MICRO NUMBER:: 12857177
SPECIMEN QUALITY:: ADEQUATE

## 2021-06-02 LAB — IRON,TIBC AND FERRITIN PANEL
%SAT: 21 % (calc) (ref 16–45)
Ferritin: 68 ng/mL (ref 16–288)
Iron: 70 ug/dL (ref 45–160)
TIBC: 341 mcg/dL (calc) (ref 250–450)

## 2021-06-02 NOTE — Telephone Encounter (Signed)
Spoke with Malori, gave VO per Dr Volanda Napoleon.

## 2021-06-03 DIAGNOSIS — I495 Sick sinus syndrome: Secondary | ICD-10-CM | POA: Diagnosis not present

## 2021-06-03 DIAGNOSIS — I129 Hypertensive chronic kidney disease with stage 1 through stage 4 chronic kidney disease, or unspecified chronic kidney disease: Secondary | ICD-10-CM | POA: Diagnosis not present

## 2021-06-03 DIAGNOSIS — Z9181 History of falling: Secondary | ICD-10-CM | POA: Diagnosis not present

## 2021-06-03 DIAGNOSIS — G8929 Other chronic pain: Secondary | ICD-10-CM | POA: Diagnosis not present

## 2021-06-03 DIAGNOSIS — I480 Paroxysmal atrial fibrillation: Secondary | ICD-10-CM | POA: Diagnosis not present

## 2021-06-03 DIAGNOSIS — N1832 Chronic kidney disease, stage 3b: Secondary | ICD-10-CM | POA: Diagnosis not present

## 2021-06-03 DIAGNOSIS — S32020D Wedge compression fracture of second lumbar vertebra, subsequent encounter for fracture with routine healing: Secondary | ICD-10-CM | POA: Diagnosis not present

## 2021-06-03 DIAGNOSIS — Z950 Presence of cardiac pacemaker: Secondary | ICD-10-CM | POA: Diagnosis not present

## 2021-06-03 DIAGNOSIS — E860 Dehydration: Secondary | ICD-10-CM | POA: Diagnosis not present

## 2021-06-03 DIAGNOSIS — Z961 Presence of intraocular lens: Secondary | ICD-10-CM | POA: Diagnosis not present

## 2021-06-03 DIAGNOSIS — E039 Hypothyroidism, unspecified: Secondary | ICD-10-CM | POA: Diagnosis not present

## 2021-06-03 DIAGNOSIS — Z7901 Long term (current) use of anticoagulants: Secondary | ICD-10-CM | POA: Diagnosis not present

## 2021-06-03 DIAGNOSIS — Z8616 Personal history of COVID-19: Secondary | ICD-10-CM | POA: Diagnosis not present

## 2021-06-03 DIAGNOSIS — M818 Other osteoporosis without current pathological fracture: Secondary | ICD-10-CM | POA: Diagnosis not present

## 2021-06-03 DIAGNOSIS — K5710 Diverticulosis of small intestine without perforation or abscess without bleeding: Secondary | ICD-10-CM | POA: Diagnosis not present

## 2021-06-03 DIAGNOSIS — N390 Urinary tract infection, site not specified: Secondary | ICD-10-CM | POA: Diagnosis not present

## 2021-06-03 NOTE — Telephone Encounter (Signed)
Ok

## 2021-06-08 ENCOUNTER — Telehealth: Payer: Self-pay | Admitting: Family Medicine

## 2021-06-08 DIAGNOSIS — S32020D Wedge compression fracture of second lumbar vertebra, subsequent encounter for fracture with routine healing: Secondary | ICD-10-CM | POA: Diagnosis not present

## 2021-06-08 DIAGNOSIS — Z9181 History of falling: Secondary | ICD-10-CM | POA: Diagnosis not present

## 2021-06-08 DIAGNOSIS — I480 Paroxysmal atrial fibrillation: Secondary | ICD-10-CM | POA: Diagnosis not present

## 2021-06-08 DIAGNOSIS — I129 Hypertensive chronic kidney disease with stage 1 through stage 4 chronic kidney disease, or unspecified chronic kidney disease: Secondary | ICD-10-CM | POA: Diagnosis not present

## 2021-06-08 DIAGNOSIS — E860 Dehydration: Secondary | ICD-10-CM | POA: Diagnosis not present

## 2021-06-08 DIAGNOSIS — Z961 Presence of intraocular lens: Secondary | ICD-10-CM | POA: Diagnosis not present

## 2021-06-08 DIAGNOSIS — K5710 Diverticulosis of small intestine without perforation or abscess without bleeding: Secondary | ICD-10-CM | POA: Diagnosis not present

## 2021-06-08 DIAGNOSIS — Z8616 Personal history of COVID-19: Secondary | ICD-10-CM | POA: Diagnosis not present

## 2021-06-08 DIAGNOSIS — N390 Urinary tract infection, site not specified: Secondary | ICD-10-CM | POA: Diagnosis not present

## 2021-06-08 DIAGNOSIS — N1832 Chronic kidney disease, stage 3b: Secondary | ICD-10-CM | POA: Diagnosis not present

## 2021-06-08 DIAGNOSIS — E039 Hypothyroidism, unspecified: Secondary | ICD-10-CM | POA: Diagnosis not present

## 2021-06-08 DIAGNOSIS — Z950 Presence of cardiac pacemaker: Secondary | ICD-10-CM | POA: Diagnosis not present

## 2021-06-08 DIAGNOSIS — M818 Other osteoporosis without current pathological fracture: Secondary | ICD-10-CM | POA: Diagnosis not present

## 2021-06-08 DIAGNOSIS — I495 Sick sinus syndrome: Secondary | ICD-10-CM | POA: Diagnosis not present

## 2021-06-08 DIAGNOSIS — G8929 Other chronic pain: Secondary | ICD-10-CM | POA: Diagnosis not present

## 2021-06-08 DIAGNOSIS — Z7901 Long term (current) use of anticoagulants: Secondary | ICD-10-CM | POA: Diagnosis not present

## 2021-06-08 NOTE — Telephone Encounter (Signed)
[name]. a Physical therapist with [facility] called because her blood pressure is a bit elevated at 170/80. She has taken blood pressure medication for today, around 8:30.    Just FYI        Please advise

## 2021-06-10 ENCOUNTER — Telehealth: Payer: Self-pay | Admitting: Family Medicine

## 2021-06-10 NOTE — Telephone Encounter (Signed)
[name] Small PT with [facility] is calling pt had missed PT visit today 06-10-2021. Pt has some things going on today

## 2021-06-10 NOTE — Telephone Encounter (Signed)
Ok

## 2021-06-13 DIAGNOSIS — N1832 Chronic kidney disease, stage 3b: Secondary | ICD-10-CM | POA: Diagnosis not present

## 2021-06-13 DIAGNOSIS — Z950 Presence of cardiac pacemaker: Secondary | ICD-10-CM | POA: Diagnosis not present

## 2021-06-13 DIAGNOSIS — S32020D Wedge compression fracture of second lumbar vertebra, subsequent encounter for fracture with routine healing: Secondary | ICD-10-CM | POA: Diagnosis not present

## 2021-06-13 DIAGNOSIS — Z8616 Personal history of COVID-19: Secondary | ICD-10-CM | POA: Diagnosis not present

## 2021-06-13 DIAGNOSIS — I129 Hypertensive chronic kidney disease with stage 1 through stage 4 chronic kidney disease, or unspecified chronic kidney disease: Secondary | ICD-10-CM | POA: Diagnosis not present

## 2021-06-13 DIAGNOSIS — K5710 Diverticulosis of small intestine without perforation or abscess without bleeding: Secondary | ICD-10-CM | POA: Diagnosis not present

## 2021-06-13 DIAGNOSIS — E039 Hypothyroidism, unspecified: Secondary | ICD-10-CM | POA: Diagnosis not present

## 2021-06-13 DIAGNOSIS — G8929 Other chronic pain: Secondary | ICD-10-CM | POA: Diagnosis not present

## 2021-06-13 DIAGNOSIS — Z961 Presence of intraocular lens: Secondary | ICD-10-CM | POA: Diagnosis not present

## 2021-06-13 DIAGNOSIS — N390 Urinary tract infection, site not specified: Secondary | ICD-10-CM | POA: Diagnosis not present

## 2021-06-13 DIAGNOSIS — I495 Sick sinus syndrome: Secondary | ICD-10-CM | POA: Diagnosis not present

## 2021-06-13 DIAGNOSIS — I480 Paroxysmal atrial fibrillation: Secondary | ICD-10-CM | POA: Diagnosis not present

## 2021-06-13 DIAGNOSIS — E860 Dehydration: Secondary | ICD-10-CM | POA: Diagnosis not present

## 2021-06-13 DIAGNOSIS — M818 Other osteoporosis without current pathological fracture: Secondary | ICD-10-CM | POA: Diagnosis not present

## 2021-06-13 DIAGNOSIS — Z9181 History of falling: Secondary | ICD-10-CM | POA: Diagnosis not present

## 2021-06-13 DIAGNOSIS — Z7901 Long term (current) use of anticoagulants: Secondary | ICD-10-CM | POA: Diagnosis not present

## 2021-06-14 DIAGNOSIS — Z961 Presence of intraocular lens: Secondary | ICD-10-CM | POA: Diagnosis not present

## 2021-06-14 DIAGNOSIS — E860 Dehydration: Secondary | ICD-10-CM | POA: Diagnosis not present

## 2021-06-14 DIAGNOSIS — K5710 Diverticulosis of small intestine without perforation or abscess without bleeding: Secondary | ICD-10-CM | POA: Diagnosis not present

## 2021-06-14 DIAGNOSIS — Z950 Presence of cardiac pacemaker: Secondary | ICD-10-CM | POA: Diagnosis not present

## 2021-06-14 DIAGNOSIS — N390 Urinary tract infection, site not specified: Secondary | ICD-10-CM | POA: Diagnosis not present

## 2021-06-14 DIAGNOSIS — E039 Hypothyroidism, unspecified: Secondary | ICD-10-CM | POA: Diagnosis not present

## 2021-06-14 DIAGNOSIS — S32020D Wedge compression fracture of second lumbar vertebra, subsequent encounter for fracture with routine healing: Secondary | ICD-10-CM | POA: Diagnosis not present

## 2021-06-14 DIAGNOSIS — Z7901 Long term (current) use of anticoagulants: Secondary | ICD-10-CM | POA: Diagnosis not present

## 2021-06-14 DIAGNOSIS — Z8616 Personal history of COVID-19: Secondary | ICD-10-CM | POA: Diagnosis not present

## 2021-06-14 DIAGNOSIS — M818 Other osteoporosis without current pathological fracture: Secondary | ICD-10-CM | POA: Diagnosis not present

## 2021-06-14 DIAGNOSIS — N1832 Chronic kidney disease, stage 3b: Secondary | ICD-10-CM | POA: Diagnosis not present

## 2021-06-14 DIAGNOSIS — G8929 Other chronic pain: Secondary | ICD-10-CM | POA: Diagnosis not present

## 2021-06-14 DIAGNOSIS — I129 Hypertensive chronic kidney disease with stage 1 through stage 4 chronic kidney disease, or unspecified chronic kidney disease: Secondary | ICD-10-CM | POA: Diagnosis not present

## 2021-06-14 DIAGNOSIS — I495 Sick sinus syndrome: Secondary | ICD-10-CM | POA: Diagnosis not present

## 2021-06-14 DIAGNOSIS — I480 Paroxysmal atrial fibrillation: Secondary | ICD-10-CM | POA: Diagnosis not present

## 2021-06-14 DIAGNOSIS — Z9181 History of falling: Secondary | ICD-10-CM | POA: Diagnosis not present

## 2021-06-15 DIAGNOSIS — Z950 Presence of cardiac pacemaker: Secondary | ICD-10-CM | POA: Diagnosis not present

## 2021-06-15 DIAGNOSIS — Z961 Presence of intraocular lens: Secondary | ICD-10-CM | POA: Diagnosis not present

## 2021-06-15 DIAGNOSIS — G8929 Other chronic pain: Secondary | ICD-10-CM | POA: Diagnosis not present

## 2021-06-15 DIAGNOSIS — N1832 Chronic kidney disease, stage 3b: Secondary | ICD-10-CM | POA: Diagnosis not present

## 2021-06-15 DIAGNOSIS — I495 Sick sinus syndrome: Secondary | ICD-10-CM | POA: Diagnosis not present

## 2021-06-15 DIAGNOSIS — I480 Paroxysmal atrial fibrillation: Secondary | ICD-10-CM | POA: Diagnosis not present

## 2021-06-15 DIAGNOSIS — S32020D Wedge compression fracture of second lumbar vertebra, subsequent encounter for fracture with routine healing: Secondary | ICD-10-CM | POA: Diagnosis not present

## 2021-06-15 DIAGNOSIS — N390 Urinary tract infection, site not specified: Secondary | ICD-10-CM | POA: Diagnosis not present

## 2021-06-15 DIAGNOSIS — Z8616 Personal history of COVID-19: Secondary | ICD-10-CM | POA: Diagnosis not present

## 2021-06-15 DIAGNOSIS — I129 Hypertensive chronic kidney disease with stage 1 through stage 4 chronic kidney disease, or unspecified chronic kidney disease: Secondary | ICD-10-CM | POA: Diagnosis not present

## 2021-06-15 DIAGNOSIS — Z7901 Long term (current) use of anticoagulants: Secondary | ICD-10-CM | POA: Diagnosis not present

## 2021-06-15 DIAGNOSIS — E039 Hypothyroidism, unspecified: Secondary | ICD-10-CM | POA: Diagnosis not present

## 2021-06-15 DIAGNOSIS — M818 Other osteoporosis without current pathological fracture: Secondary | ICD-10-CM | POA: Diagnosis not present

## 2021-06-15 DIAGNOSIS — Z9181 History of falling: Secondary | ICD-10-CM | POA: Diagnosis not present

## 2021-06-15 DIAGNOSIS — E860 Dehydration: Secondary | ICD-10-CM | POA: Diagnosis not present

## 2021-06-15 DIAGNOSIS — K5710 Diverticulosis of small intestine without perforation or abscess without bleeding: Secondary | ICD-10-CM | POA: Diagnosis not present

## 2021-06-20 DIAGNOSIS — N390 Urinary tract infection, site not specified: Secondary | ICD-10-CM | POA: Diagnosis not present

## 2021-06-20 DIAGNOSIS — I495 Sick sinus syndrome: Secondary | ICD-10-CM | POA: Diagnosis not present

## 2021-06-20 DIAGNOSIS — G8929 Other chronic pain: Secondary | ICD-10-CM | POA: Diagnosis not present

## 2021-06-20 DIAGNOSIS — N1832 Chronic kidney disease, stage 3b: Secondary | ICD-10-CM | POA: Diagnosis not present

## 2021-06-20 DIAGNOSIS — Z961 Presence of intraocular lens: Secondary | ICD-10-CM | POA: Diagnosis not present

## 2021-06-20 DIAGNOSIS — Z7901 Long term (current) use of anticoagulants: Secondary | ICD-10-CM | POA: Diagnosis not present

## 2021-06-20 DIAGNOSIS — I129 Hypertensive chronic kidney disease with stage 1 through stage 4 chronic kidney disease, or unspecified chronic kidney disease: Secondary | ICD-10-CM | POA: Diagnosis not present

## 2021-06-20 DIAGNOSIS — Z8616 Personal history of COVID-19: Secondary | ICD-10-CM | POA: Diagnosis not present

## 2021-06-20 DIAGNOSIS — E860 Dehydration: Secondary | ICD-10-CM | POA: Diagnosis not present

## 2021-06-20 DIAGNOSIS — Z950 Presence of cardiac pacemaker: Secondary | ICD-10-CM | POA: Diagnosis not present

## 2021-06-20 DIAGNOSIS — I480 Paroxysmal atrial fibrillation: Secondary | ICD-10-CM | POA: Diagnosis not present

## 2021-06-20 DIAGNOSIS — K5710 Diverticulosis of small intestine without perforation or abscess without bleeding: Secondary | ICD-10-CM | POA: Diagnosis not present

## 2021-06-20 DIAGNOSIS — E039 Hypothyroidism, unspecified: Secondary | ICD-10-CM | POA: Diagnosis not present

## 2021-06-20 DIAGNOSIS — Z9181 History of falling: Secondary | ICD-10-CM | POA: Diagnosis not present

## 2021-06-20 DIAGNOSIS — S32020D Wedge compression fracture of second lumbar vertebra, subsequent encounter for fracture with routine healing: Secondary | ICD-10-CM | POA: Diagnosis not present

## 2021-06-20 DIAGNOSIS — M818 Other osteoporosis without current pathological fracture: Secondary | ICD-10-CM | POA: Diagnosis not present

## 2021-06-22 DIAGNOSIS — I480 Paroxysmal atrial fibrillation: Secondary | ICD-10-CM | POA: Diagnosis not present

## 2021-06-22 DIAGNOSIS — S32020D Wedge compression fracture of second lumbar vertebra, subsequent encounter for fracture with routine healing: Secondary | ICD-10-CM | POA: Diagnosis not present

## 2021-06-22 DIAGNOSIS — K5710 Diverticulosis of small intestine without perforation or abscess without bleeding: Secondary | ICD-10-CM | POA: Diagnosis not present

## 2021-06-22 DIAGNOSIS — M818 Other osteoporosis without current pathological fracture: Secondary | ICD-10-CM | POA: Diagnosis not present

## 2021-06-22 DIAGNOSIS — N1832 Chronic kidney disease, stage 3b: Secondary | ICD-10-CM | POA: Diagnosis not present

## 2021-06-22 DIAGNOSIS — E860 Dehydration: Secondary | ICD-10-CM | POA: Diagnosis not present

## 2021-06-22 DIAGNOSIS — N390 Urinary tract infection, site not specified: Secondary | ICD-10-CM | POA: Diagnosis not present

## 2021-06-22 DIAGNOSIS — E039 Hypothyroidism, unspecified: Secondary | ICD-10-CM | POA: Diagnosis not present

## 2021-06-22 DIAGNOSIS — Z9181 History of falling: Secondary | ICD-10-CM | POA: Diagnosis not present

## 2021-06-22 DIAGNOSIS — G8929 Other chronic pain: Secondary | ICD-10-CM | POA: Diagnosis not present

## 2021-06-22 DIAGNOSIS — I495 Sick sinus syndrome: Secondary | ICD-10-CM | POA: Diagnosis not present

## 2021-06-22 DIAGNOSIS — Z950 Presence of cardiac pacemaker: Secondary | ICD-10-CM | POA: Diagnosis not present

## 2021-06-22 DIAGNOSIS — Z8616 Personal history of COVID-19: Secondary | ICD-10-CM | POA: Diagnosis not present

## 2021-06-22 DIAGNOSIS — Z961 Presence of intraocular lens: Secondary | ICD-10-CM | POA: Diagnosis not present

## 2021-06-22 DIAGNOSIS — I129 Hypertensive chronic kidney disease with stage 1 through stage 4 chronic kidney disease, or unspecified chronic kidney disease: Secondary | ICD-10-CM | POA: Diagnosis not present

## 2021-06-22 DIAGNOSIS — Z7901 Long term (current) use of anticoagulants: Secondary | ICD-10-CM | POA: Diagnosis not present

## 2021-06-27 ENCOUNTER — Telehealth: Payer: Self-pay | Admitting: Family Medicine

## 2021-06-27 NOTE — Telephone Encounter (Signed)
Patient called in to request a prescription for a kidney infection.  Patient could be contacted at [phone].  Please advise.

## 2021-06-27 NOTE — Telephone Encounter (Signed)
Pt has been scheduled for a visit.

## 2021-06-28 ENCOUNTER — Ambulatory Visit: Payer: Medicare Other | Admitting: Family Medicine

## 2021-07-11 ENCOUNTER — Emergency Department (HOSPITAL_BASED_OUTPATIENT_CLINIC_OR_DEPARTMENT_OTHER)
Admission: EM | Admit: 2021-07-11 | Discharge: 2021-07-11 | Disposition: A | Discharge status: Home / Self Care | Payer: Medicare Other | Attending: Emergency Medicine | Admitting: Emergency Medicine

## 2021-07-11 ENCOUNTER — Encounter (HOSPITAL_BASED_OUTPATIENT_CLINIC_OR_DEPARTMENT_OTHER): Payer: Self-pay | Admitting: Obstetrics and Gynecology

## 2021-07-11 ENCOUNTER — Emergency Department (HOSPITAL_BASED_OUTPATIENT_CLINIC_OR_DEPARTMENT_OTHER): Payer: Medicare Other

## 2021-07-11 ENCOUNTER — Other Ambulatory Visit: Payer: Self-pay

## 2021-07-11 DIAGNOSIS — R450 Nervousness: Secondary | ICD-10-CM | POA: Diagnosis not present

## 2021-07-11 DIAGNOSIS — Z79899 Other long term (current) drug therapy: Secondary | ICD-10-CM | POA: Insufficient documentation

## 2021-07-11 DIAGNOSIS — I10 Essential (primary) hypertension: Secondary | ICD-10-CM | POA: Diagnosis not present

## 2021-07-11 DIAGNOSIS — R519 Headache, unspecified: Secondary | ICD-10-CM | POA: Diagnosis not present

## 2021-07-11 DIAGNOSIS — Z7901 Long term (current) use of anticoagulants: Secondary | ICD-10-CM | POA: Insufficient documentation

## 2021-07-11 DIAGNOSIS — I6381 Other cerebral infarction due to occlusion or stenosis of small artery: Secondary | ICD-10-CM | POA: Diagnosis not present

## 2021-07-11 DIAGNOSIS — R6889 Other general symptoms and signs: Secondary | ICD-10-CM | POA: Diagnosis not present

## 2021-07-11 DIAGNOSIS — R0902 Hypoxemia: Secondary | ICD-10-CM | POA: Diagnosis not present

## 2021-07-11 DIAGNOSIS — W19XXXA Unspecified fall, initial encounter: Secondary | ICD-10-CM | POA: Insufficient documentation

## 2021-07-11 DIAGNOSIS — R531 Weakness: Secondary | ICD-10-CM | POA: Diagnosis not present

## 2021-07-11 LAB — BASIC METABOLIC PANEL
Anion gap: 9 (ref 5–15)
BUN: 18 mg/dL (ref 8–23)
CO2: 26 mmol/L (ref 22–32)
Calcium: 9.3 mg/dL (ref 8.9–10.3)
Chloride: 103 mmol/L (ref 98–111)
Creatinine, Ser: 1.12 mg/dL — ABNORMAL HIGH (ref 0.44–1.00)
GFR, Estimated: 47 mL/min — ABNORMAL LOW (ref >60)
Glucose, Bld: 115 mg/dL — ABNORMAL HIGH (ref 70–99)
Potassium: 4.6 mmol/L (ref 3.5–5.1)
Sodium: 138 mmol/L (ref 135–145)

## 2021-07-11 LAB — URINALYSIS, ROUTINE W REFLEX MICROSCOPIC
Bilirubin Urine: NEGATIVE
Glucose, UA: NEGATIVE mg/dL
Ketones, ur: NEGATIVE mg/dL
Nitrite: NEGATIVE
Specific Gravity, Urine: 1.013 (ref 1.005–1.030)
WBC, UA: >50 WBC/hpf — ABNORMAL HIGH (ref 0–5)
pH: 7 (ref 5.0–8.0)

## 2021-07-11 LAB — CBC WITH DIFFERENTIAL/PLATELET
Abs Immature Granulocytes: 0.04 10*3/uL (ref 0.00–0.07)
Basophils Absolute: 0 10*3/uL (ref 0.0–0.1)
Basophils Relative: 0 %
Eosinophils Absolute: 0.1 10*3/uL (ref 0.0–0.5)
Eosinophils Relative: 1 %
HCT: 42.8 % (ref 36.0–46.0)
Hemoglobin: 13.3 g/dL (ref 12.0–15.0)
Immature Granulocytes: 0 %
Lymphocytes Relative: 15 %
Lymphs Abs: 1.6 10*3/uL (ref 0.7–4.0)
MCH: 29.6 pg (ref 26.0–34.0)
MCHC: 31.1 g/dL (ref 30.0–36.0)
MCV: 95.3 fL (ref 80.0–100.0)
Monocytes Absolute: 0.7 10*3/uL (ref 0.1–1.0)
Monocytes Relative: 6 %
Neutro Abs: 8.1 10*3/uL — ABNORMAL HIGH (ref 1.7–7.7)
Neutrophils Relative %: 78 %
Platelets: 175 10*3/uL (ref 150–400)
RBC: 4.49 MIL/uL (ref 3.87–5.11)
RDW: 14 % (ref 11.5–15.5)
WBC: 10.5 10*3/uL (ref 4.0–10.5)
nRBC: 0 % (ref 0.0–0.2)

## 2021-07-11 MED ORDER — SODIUM CHLORIDE 0.9 % IV BOLUS
500.0000 mL | Freq: Once | INTRAVENOUS | Status: AC
  Administered 2021-07-11: 500 mL via INTRAVENOUS

## 2021-07-11 NOTE — ED Provider Notes (Signed)
[facility] EMERGENCY DEPT Provider Note   CSN: [id number] Arrival date & time: 07/11/21  1820     History  Chief Complaint  Patient presents with   [name] Small is a 88 y.o. female.  HPI     88 y/o female comes in with chief complaint of fall. Patient indicates that she was in the kitchen when her legs suddenly gave out and she had a fall.  She does not know if she struck her head, but doubts it.  She is on Xarelto because of history of A-fib.  Patient indicates that she feels has been feeling slightly weak today, but in general does not have a lot of falls.  She lives by herself.  Review of systems negative for any chest pain, shortness of breath, abdominal pain, nausea, vomiting, diarrhea, new focal numbness, weakness, dizziness, UTI-like symptoms.  She is noted to have elevated blood pressure.   Home Medications Prior to Admission medications   Medication Sig Start Date End Date Taking? Authorizing Provider  acetaminophen (TYLENOL) 500 MG tablet Take 1 tablet (500 mg total) by mouth every 6 (six) hours as needed. 03/07/21   Varney Biles, MD  amiodarone (PACERONE) 200 MG tablet TAKE 1 TABLET(200 MG) BY MOUTH DAILY Patient taking differently: Take 200 mg by mouth daily with lunch. 02/07/21   Josue Hector, MD  atorvastatin (LIPITOR) 40 MG tablet TAKE 1 TABLET(40 MG) BY MOUTH DAILY Patient taking differently: Take 40 mg by mouth daily with lunch. 02/11/21   Billie Ruddy, MD  calcitonin, salmon, (MIACALCIN) 200 UNIT/ACT nasal spray Place 1 spray into alternate nostrils daily. 03/11/21   Billie Ruddy, MD  cephALEXin (KEFLEX) 500 MG capsule Take 1 capsule (500 mg total) by mouth 4 (four) times daily. 05/23/21   Carlisle Cater, PA-C  docusate sodium (COLACE) 100 MG capsule Take 1 capsule (100 mg total) by mouth 2 (two) times daily. 03/22/21   Barb Merino, MD  losartan (COZAAR) 100 MG tablet Take 1 tablet (100 mg total) by mouth daily. 04/12/21   Antonieta Pert, MD  metoprolol tartrate (LOPRESSOR) 25 MG tablet Take 1.5 tablets (37.5 mg total) by mouth 2 (two) times daily. 06/01/21   Billie Ruddy, MD  ondansetron (ZOFRAN) 4 MG tablet Take 1 tablet (4 mg total) by mouth daily as needed for nausea or vomiting. Patient not taking: Reported on 05/04/2021 04/11/21 04/11/22  Antonieta Pert, MD  oxyCODONE (OXY IR/ROXICODONE) 5 MG immediate release tablet Take 1 tablet (5 mg total) by mouth every 4 (four) hours as needed for severe pain. Patient not taking: Reported on 05/04/2021 03/22/21   Barb Merino, MD  pantoprazole (PROTONIX) 40 MG tablet Take 1 tablet (40 mg total) by mouth daily. 04/11/21 05/11/21  Antonieta Pert, MD  Polyvinyl Alcohol-Povidone (REFRESH OP) Place 1 drop into both eyes daily.    [provider]  XARELTO 15 MG TABS tablet TAKE 1 TABLET(15 MG) BY MOUTH DAILY WITH DINNER Patient taking differently: Take 15 mg by mouth daily with lunch. 09/27/20   Josue Hector, MD      Allergies    Septra ds [sulfamethoxazole-trimethoprim] and Lisinopril    Review of Systems   Review of Systems  Constitutional:  Positive for activity change.  Cardiovascular:  Negative for chest pain.  Neurological:  Negative for dizziness, seizures, syncope, weakness, light-headedness, numbness and headaches.  Hematological:  Bruises/bleeds easily.  All other systems reviewed and are negative.  Physical Exam Updated Vital  Signs BP (!) 197/94    Pulse 65    Temp 98.4 F (36.9 C) (Oral)    Resp 19    Ht 5\' 5"  (1.651 m)    Wt 50 kg    SpO2 100%    BMI 18.34 kg/m  Physical Exam Vitals and nursing note reviewed.  Constitutional:      Appearance: She is well-developed.  HENT:     Head: Atraumatic.  Eyes:     Extraocular Movements: Extraocular movements intact.     Pupils: Pupils are equal, round, and reactive to light.  Cardiovascular:     Rate and Rhythm: Normal rate.  Pulmonary:     Effort: Pulmonary effort is normal.  Musculoskeletal:      Cervical back: Normal range of motion and neck supple.     Comments: Head to toe evaluation shows no hematoma, bleeding of the scalp, no facial abrasions, no spine step offs, crepitus of the chest or neck, no tenderness to palpation of the bilateral upper and lower extremities, no gross deformities, no chest tenderness, no pelvic pain.   Skin:    General: Skin is warm and dry.  Neurological:     Mental Status: She is alert and oriented to person, place, and time.     Cranial Nerves: No cranial nerve deficit.     Sensory: No sensory deficit.     Motor: No weakness.     Coordination: Coordination normal.    ED Results / Procedures / Treatments   Labs (all labs ordered are listed, but only abnormal results are displayed) Labs Reviewed  BASIC METABOLIC PANEL - Abnormal; Notable for the following components:      Result Value   Glucose, Bld 115 (*)    Creatinine, Ser 1.12 (*)    GFR, Estimated 47 (*)    All other components within normal limits  CBC WITH DIFFERENTIAL/PLATELET - Abnormal; Notable for the following components:   Neutro Abs 8.1 (*)    All other components within normal limits  URINALYSIS, ROUTINE W REFLEX MICROSCOPIC - Abnormal; Notable for the following components:   APPearance HAZY (*)    Hgb urine dipstick SMALL (*)    Protein, ur TRACE (*)    Leukocytes,Ua LARGE (*)    WBC, UA >50 (*)    Bacteria, UA RARE (*)    All other components within normal limits    EKG EKG Interpretation  Date/Time:  Monday July 11 2021 18:30:49 EST Ventricular Rate:  62 PR Interval:  229 QRS Duration: 102 QT Interval:  456 QTC Calculation: 464 R Axis:   -60 Text Interpretation: Sinus rhythm Prolonged PR interval Incomplete RBBB and LAFB Abnormal R-wave progression, early transition No acute changes Confirmed by Varney Biles ([id number]) on 07/11/2021 8:16:39 PM  Radiology CT Head Wo Contrast  Result Date: 07/11/2021 CLINICAL DATA:  Head trauma on blood thinners. EXAM: CT HEAD  WITHOUT CONTRAST TECHNIQUE: Contiguous axial images were obtained from the base of the skull through the vertex without intravenous contrast. RADIATION DOSE REDUCTION: This exam was performed according to the departmental dose-optimization program which includes automated exposure control, adjustment of the mA and/or kV according to patient size and/or use of iterative reconstruction technique. COMPARISON:  Head CT of 04/06/2021 FINDINGS: Brain: There is moderate cerebrocerebellar atrophy and moderate to severe small vessel disease in the cerebral white matter. A chronic appearing left parietotemporal cortical infarct is new from 04/06/2021. No focal asymmetry is seen worrisome for acute cortical based infarct, hemorrhage or mass.  There is no midline shift, with mild-to-moderate atrophic ventriculomegaly once again. There are tiny chronic bilateral gangliocapsular lacunar infarcts. Vascular: There are calcifications in the distal left vertebral artery and both siphons but no hyperdense central vasculature is seen. Skull: There is calvarial osteopenia without evidence of fractures. Arachnoid granulations noted in the posterior occipital bone. No focal skull lesion is seen. Sinuses/Orbits: There is membrane thickening and secretions in the right sphenoid air cell. Visualized sinuses and mastoid air cells are otherwise clear. Again noted are old lens replacements. Other: None. IMPRESSION: 1. No acute intracranial CT findings or depressed skull fractures. 2. Chronic changes, including a chronic appearing left parietotemporal cortical infarct new since 04/06/2021. 3. Right sphenoid sinus disease. Electronically Signed   By: Telford Nab M.D.   On: 07/11/2021 20:19    Procedures Procedures    Medications Ordered in ED Medications  sodium chloride 0.9 % bolus 500 mL (0 mLs Intravenous Stopped 07/11/21 2033)    ED Course/ Medical Decision Making/ A&P                           Medical Decision Making Amount  and/or Complexity of Data Reviewed Labs: ordered. Radiology: ordered.   88 year old female comes in with chief complaint of fall.  She has history of A-fib and is on Xarelto.  Does not appear that she had a big fall.  She was unable to get up and felt weak.  The fall was because of her legs giving out.  Leading up to the day, she was feeling well.  Differential diagnosis includes intracranial bleed from the fall.  Her musculoskeletal exam is reassuring.  Although I consider getting x-ray of her chest and hip, she is having no shortness of breath, no pleuritic chest pain and no pelvic pain.  She has ambulated.  X-rays of chest and pelvis not needed or indicated at this time.  Patient's blood pressure is running high.  I have reviewed previous visits with the primary care doctor and also in the ER and her recent admission.  Her BP frequently is running over 160s.  It is unclear if patient is routinely taking her medications.  This concern discussed with the patient and her son.  They will follow-up with their PCP about it.  Patient's blood work was ordered to ensure there was no evidence of profound anemia or any endorgan dysfunction.  Her urinalysis has pyuria and bacteriuria.  Patient however denies any urinary frequency, pelvic pain, flank pain, burning with urination.  She is not diabetic.  She has no fevers and it does not appear that she has generalized weakness prior to the fall.  Do not think she is having cystitis.  We will not treat the urine analysis.  Finally we had at length discussion about patient's living situation.  There is someone visiting the home 3 times a week.  Son is also mostly available and lives nearby.  Patient wants to live at home.  We advised at this time that she stopped taking Xarelto.  Allegedly she has had 3 falls in the last 3 months.  I have advised him to follow-up with their A-fib clinic if they are having hesitancy on discontinuing Xarelto and wants second  opinion.  I also want him to follow-up with cardiology clinic if they do discontinue the Xarelto so that they are made aware.  Final Clinical Impression(s) / ED Diagnoses Final diagnoses:  Fall, initial encounter  Essential hypertension  Rx / DC Orders ED Discharge Orders     None         Varney Biles, MD 07/11/21 2122

## 2021-07-11 NOTE — Discharge Instructions (Addendum)
You are seen in the ER for your fall.  We noted that your blood pressure was high while in the ER  Please ensure you are taking a blood pressure medication.  Check your blood pressure 3 times a day, write it on the log that has been provided.  Follow-up with your doctor in 1 week for blood pressure management.  Additionally, you had mentioned that you had 3 falls in the last 3 months.  Given the increased frequency of falls, it is becoming dangerous for you to be on Xarelto.  Please either discontinue Xarelto or contact to A-fib clinic as soon as possible to discuss whether it is safe to continue the medication or not.  Return to the ER if you start having increased weakness, fevers, chills, abdominal pain, nausea, vomiting

## 2021-07-20 DIAGNOSIS — G9341 Metabolic encephalopathy: Secondary | ICD-10-CM

## 2021-07-20 DIAGNOSIS — S72009A Fracture of unspecified part of neck of unspecified femur, initial encounter for closed fracture: Secondary | ICD-10-CM

## 2021-07-20 DIAGNOSIS — I639 Cerebral infarction, unspecified: Secondary | ICD-10-CM

## 2021-07-20 HISTORY — DX: Metabolic encephalopathy: G93.41

## 2021-07-20 HISTORY — DX: Fracture of unspecified part of neck of unspecified femur, initial encounter for closed fracture: S72.009A

## 2021-07-20 HISTORY — DX: Cerebral infarction, unspecified: I63.9

## 2021-07-26 ENCOUNTER — Telehealth: Payer: Self-pay | Admitting: Cardiology

## 2021-07-26 NOTE — Telephone Encounter (Signed)
?  1. Has your device fired? no ? ?2. Is you device beeping? no ? ?3. Are you experiencing draining or swelling at device site? no ? ?4. Are you calling to see if we received your device transmission? no ? ?5. Have you passed out? No ? ? ?Patient states she called the device clinic and was connected to "him" and does not know who she spoke with. She says she has been having trouble sending a transmission.  ? ? ? ?Please route to Device Clinic Pool  ?

## 2021-07-26 NOTE — Telephone Encounter (Signed)
I spoke with the patient and she states she is receiving a new monitor. ?

## 2021-08-02 ENCOUNTER — Encounter (HOSPITAL_BASED_OUTPATIENT_CLINIC_OR_DEPARTMENT_OTHER): Payer: Self-pay

## 2021-08-02 ENCOUNTER — Emergency Department (HOSPITAL_BASED_OUTPATIENT_CLINIC_OR_DEPARTMENT_OTHER): Payer: Medicare Other

## 2021-08-02 ENCOUNTER — Emergency Department (HOSPITAL_BASED_OUTPATIENT_CLINIC_OR_DEPARTMENT_OTHER)
Admission: EM | Admit: 2021-08-02 | Discharge: 2021-08-02 | Disposition: A | Discharge status: Home / Self Care | Payer: Medicare Other | Attending: Emergency Medicine | Admitting: Emergency Medicine

## 2021-08-02 ENCOUNTER — Other Ambulatory Visit: Payer: Self-pay

## 2021-08-02 DIAGNOSIS — N281 Cyst of kidney, acquired: Secondary | ICD-10-CM | POA: Diagnosis not present

## 2021-08-02 DIAGNOSIS — I700 Atherosclerosis of aorta: Secondary | ICD-10-CM | POA: Diagnosis not present

## 2021-08-02 DIAGNOSIS — M47816 Spondylosis without myelopathy or radiculopathy, lumbar region: Secondary | ICD-10-CM | POA: Diagnosis not present

## 2021-08-02 DIAGNOSIS — M545 Low back pain, unspecified: Secondary | ICD-10-CM | POA: Diagnosis not present

## 2021-08-02 DIAGNOSIS — N83201 Unspecified ovarian cyst, right side: Secondary | ICD-10-CM | POA: Insufficient documentation

## 2021-08-02 DIAGNOSIS — M549 Dorsalgia, unspecified: Secondary | ICD-10-CM | POA: Diagnosis present

## 2021-08-02 DIAGNOSIS — Z7901 Long term (current) use of anticoagulants: Secondary | ICD-10-CM | POA: Insufficient documentation

## 2021-08-02 DIAGNOSIS — B9689 Other specified bacterial agents as the cause of diseases classified elsewhere: Secondary | ICD-10-CM | POA: Insufficient documentation

## 2021-08-02 DIAGNOSIS — N390 Urinary tract infection, site not specified: Secondary | ICD-10-CM | POA: Diagnosis not present

## 2021-08-02 DIAGNOSIS — M4316 Spondylolisthesis, lumbar region: Secondary | ICD-10-CM | POA: Diagnosis not present

## 2021-08-02 LAB — CBC WITH DIFFERENTIAL/PLATELET
Abs Immature Granulocytes: 0.02 10*3/uL (ref 0.00–0.07)
Basophils Absolute: 0 10*3/uL (ref 0.0–0.1)
Basophils Relative: 1 %
Eosinophils Absolute: 0.1 10*3/uL (ref 0.0–0.5)
Eosinophils Relative: 2 %
HCT: 43.9 % (ref 36.0–46.0)
Hemoglobin: 13.8 g/dL (ref 12.0–15.0)
Immature Granulocytes: 0 %
Lymphocytes Relative: 30 %
Lymphs Abs: 2 10*3/uL (ref 0.7–4.0)
MCH: 29.4 pg (ref 26.0–34.0)
MCHC: 31.4 g/dL (ref 30.0–36.0)
MCV: 93.4 fL (ref 80.0–100.0)
Monocytes Absolute: 0.7 10*3/uL (ref 0.1–1.0)
Monocytes Relative: 10 %
Neutro Abs: 3.9 10*3/uL (ref 1.7–7.7)
Neutrophils Relative %: 57 %
Platelets: 204 10*3/uL (ref 150–400)
RBC: 4.7 MIL/uL (ref 3.87–5.11)
RDW: 13.7 % (ref 11.5–15.5)
WBC: 6.8 10*3/uL (ref 4.0–10.5)
nRBC: 0 % (ref 0.0–0.2)

## 2021-08-02 LAB — URINALYSIS, ROUTINE W REFLEX MICROSCOPIC
Bilirubin Urine: NEGATIVE
Glucose, UA: NEGATIVE mg/dL
Hgb urine dipstick: NEGATIVE
Ketones, ur: NEGATIVE mg/dL
Nitrite: NEGATIVE
Protein, ur: NEGATIVE mg/dL
Specific Gravity, Urine: 1.005 (ref 1.005–1.030)
pH: 6.5 (ref 5.0–8.0)

## 2021-08-02 LAB — COMPREHENSIVE METABOLIC PANEL
ALT: 16 U/L (ref 0–44)
AST: 21 U/L (ref 15–41)
Albumin: 4.5 g/dL (ref 3.5–5.0)
Alkaline Phosphatase: 95 U/L (ref 38–126)
Anion gap: 9 (ref 5–15)
BUN: 14 mg/dL (ref 8–23)
CO2: 28 mmol/L (ref 22–32)
Calcium: 10.1 mg/dL (ref 8.9–10.3)
Chloride: 103 mmol/L (ref 98–111)
Creatinine, Ser: 1.09 mg/dL — ABNORMAL HIGH (ref 0.44–1.00)
GFR, Estimated: 48 mL/min — ABNORMAL LOW (ref >60)
Glucose, Bld: 98 mg/dL (ref 70–99)
Potassium: 4.2 mmol/L (ref 3.5–5.1)
Sodium: 140 mmol/L (ref 135–145)
Total Bilirubin: 0.7 mg/dL (ref 0.3–1.2)
Total Protein: 7.6 g/dL (ref 6.5–8.1)

## 2021-08-02 LAB — TROPONIN I (HIGH SENSITIVITY): Troponin I (High Sensitivity): 7 ng/L (ref <18)

## 2021-08-02 LAB — LIPASE, BLOOD: Lipase: 65 U/L — ABNORMAL HIGH (ref 11–51)

## 2021-08-02 MED ORDER — KETOROLAC TROMETHAMINE 15 MG/ML IJ SOLN
15.0000 mg | Freq: Once | INTRAMUSCULAR | Status: AC
  Administered 2021-08-02: 15 mg via INTRAVENOUS
  Filled 2021-08-02: qty 1

## 2021-08-02 MED ORDER — OXYCODONE HCL 5 MG PO TABS: 5.0000 mg | ORAL_TABLET | Freq: Three times a day (TID) | ORAL | 0 refills | Status: DC | PRN

## 2021-08-02 MED ORDER — SENNOSIDES-DOCUSATE SODIUM 8.6-50 MG PO TABS: 1.0000 | ORAL_TABLET | Freq: Every day | ORAL | 0 refills | Status: DC | PRN

## 2021-08-02 MED ORDER — ONDANSETRON HCL 4 MG/2ML IJ SOLN
4.0000 mg | Freq: Once | INTRAMUSCULAR | Status: AC
  Administered 2021-08-02: 4 mg via INTRAVENOUS
  Filled 2021-08-02: qty 2

## 2021-08-02 MED ORDER — IOHEXOL 300 MG/ML  SOLN
60.0000 mL | Freq: Once | INTRAMUSCULAR | Status: AC | PRN
  Administered 2021-08-02: 60 mL via INTRAVENOUS

## 2021-08-02 MED ORDER — FENTANYL CITRATE PF 50 MCG/ML IJ SOSY
25.0000 ug | PREFILLED_SYRINGE | Freq: Once | INTRAMUSCULAR | Status: AC
Start: 2021-08-02 — End: 2021-08-02
  Administered 2021-08-02: 25 ug via INTRAVENOUS
  Filled 2021-08-02: qty 1

## 2021-08-02 MED ORDER — CEPHALEXIN 500 MG PO CAPS: 500.0000 mg | ORAL_CAPSULE | Freq: Two times a day (BID) | ORAL | 0 refills | Status: AC

## 2021-08-02 MED ORDER — SODIUM CHLORIDE 0.9 % IV SOLN
1.0000 g | Freq: Once | INTRAVENOUS | Status: AC
  Administered 2021-08-02: 1 g via INTRAVENOUS
  Filled 2021-08-02: qty 10

## 2021-08-02 MED ORDER — IOHEXOL 300 MG/ML  SOLN
100.0000 mL | Freq: Once | INTRAMUSCULAR | Status: AC | PRN
  Administered 2021-08-02: 70 mL via INTRAVENOUS

## 2021-08-02 MED ORDER — FENTANYL CITRATE PF 50 MCG/ML IJ SOSY
50.0000 ug | PREFILLED_SYRINGE | Freq: Once | INTRAMUSCULAR | Status: AC
  Administered 2021-08-02: 50 ug via INTRAVENOUS
  Filled 2021-08-02: qty 1

## 2021-08-02 NOTE — ED Notes (Signed)
Was able to walk pt around the room with assistance, she usually uses a walker. Pt stated that she was not in any pain and felt okay being on her feet. ?

## 2021-08-02 NOTE — ED Provider Notes (Signed)
?[facility] EMERGENCY DEPT ?Provider Note ? ? ?CSN: [id number] ?Arrival date & time: 08/02/21  1810 ? ?  ? ?History ? ?Chief Complaint  ?Patient presents with  ? Back Pain  ? ? ?[name] Small is a 88 y.o. female presenting to emergency department with right CVA tenderness.  Patient reports that her back began hurting last night while she was in bed.  She has the pain comes and goes.  She is never had it before.  She wonders whether she may have a kidney stone.  Denies dysuria hematuria.  Denies nausea, vomiting diarrhea.  Here with her son at bedside. ? ?Patient had a CT scan of the abdomen performed in January 2023 which showed cholecystectomy, low-attenuation lesion in the right kidney, no acute stone, compression fx of L2. ? ?HPI ? ?  ? ?Home Medications ?Prior to Admission medications   ?Medication Sig Start Date End Date Taking? Authorizing Provider  ?cephALEXin (KEFLEX) 500 MG capsule Take 1 capsule (500 mg total) by mouth 2 (two) times daily for 4 days. 08/03/21 08/07/21 Yes Ernesteen Mihalic, Carola Rhine, MD  ?oxyCODONE (ROXICODONE) 5 MG immediate release tablet Take 1 tablet (5 mg total) by mouth every 8 (eight) hours as needed for up to 10 doses for severe pain. 08/02/21  Yes Crews Mccollam, Carola Rhine, MD  ?senna-docusate (SENOKOT-S) 8.6-50 MG tablet Take 1 tablet by mouth daily as needed for up to 15 doses for mild constipation. To prevent constipation when taking oxycodone 08/02/21  Yes Kizzy Olafson, Carola Rhine, MD  ?acetaminophen (TYLENOL) 500 MG tablet Take 1 tablet (500 mg total) by mouth every 6 (six) hours as needed. 03/07/21   Varney Biles, MD  ?amiodarone (PACERONE) 200 MG tablet TAKE 1 TABLET(200 MG) BY MOUTH DAILY ?Patient taking differently: Take 200 mg by mouth daily with lunch. 02/07/21   Josue Hector, MD  ?atorvastatin (LIPITOR) 40 MG tablet TAKE 1 TABLET(40 MG) BY MOUTH DAILY ?Patient taking differently: Take 40 mg by mouth daily with lunch. 02/11/21   Billie Ruddy, MD  ?calcitonin, salmon, (MIACALCIN)  200 UNIT/ACT nasal spray Place 1 spray into alternate nostrils daily. 03/11/21   Billie Ruddy, MD  ?cephALEXin (KEFLEX) 500 MG capsule Take 1 capsule (500 mg total) by mouth 4 (four) times daily. 05/23/21   Carlisle Cater, PA-C  ?docusate sodium (COLACE) 100 MG capsule Take 1 capsule (100 mg total) by mouth 2 (two) times daily. 03/22/21   Barb Merino, MD  ?losartan (COZAAR) 100 MG tablet Take 1 tablet (100 mg total) by mouth daily. 04/12/21   Antonieta Pert, MD  ?metoprolol tartrate (LOPRESSOR) 25 MG tablet Take 1.5 tablets (37.5 mg total) by mouth 2 (two) times daily. 06/01/21   Billie Ruddy, MD  ?ondansetron (ZOFRAN) 4 MG tablet Take 1 tablet (4 mg total) by mouth daily as needed for nausea or vomiting. ?Patient not taking: Reported on 05/04/2021 04/11/21 04/11/22  Antonieta Pert, MD  ?oxyCODONE (OXY IR/ROXICODONE) 5 MG immediate release tablet Take 1 tablet (5 mg total) by mouth every 4 (four) hours as needed for severe pain. ?Patient not taking: Reported on 05/04/2021 03/22/21   Barb Merino, MD  ?pantoprazole (PROTONIX) 40 MG tablet Take 1 tablet (40 mg total) by mouth daily. 04/11/21 05/11/21  Antonieta Pert, MD  ?Polyvinyl Alcohol-Povidone (REFRESH OP) Place 1 drop into both eyes daily.    [provider]  ?XARELTO 15 MG TABS tablet TAKE 1 TABLET(15 MG) BY MOUTH DAILY WITH DINNER ?Patient taking differently: Take 15 mg by mouth  daily with lunch. 09/27/20   Josue Hector, MD  ?   ? ?Allergies    ?Septra ds [sulfamethoxazole-trimethoprim] and Lisinopril   ? ?Review of Systems   ?Review of Systems ? ?Physical Exam ?Updated Vital Signs ?BP (!) 178/77   Pulse (!) 59   Temp 98.1 ?F (36.7 ?C) (Oral)   Resp 18   Ht '5\' 5"'$  (1.651 m)   Wt 50 kg   SpO2 100%   BMI 18.34 kg/m?  ?Physical Exam ?Constitutional:   ?   General: She is in acute distress.  ?HENT:  ?   Head: Normocephalic and atraumatic.  ?Eyes:  ?   Conjunctiva/sclera: Conjunctivae normal.  ?   Pupils: Pupils are equal, round, and reactive to light.   ?Cardiovascular:  ?   Rate and Rhythm: Normal rate and regular rhythm.  ?Pulmonary:  ?   Effort: Pulmonary effort is normal. No respiratory distress.  ?Abdominal:  ?   General: There is no distension.  ?   Tenderness: There is no abdominal tenderness.  ?Musculoskeletal:  ?   Comments: Right CVA tenderness, no spinal midline tenderness  ?Skin: ?   General: Skin is warm and dry.  ?Neurological:  ?   General: No focal deficit present.  ?   Mental Status: She is alert. Mental status is at baseline.  ?Psychiatric:     ?   Mood and Affect: Mood normal.     ?   Behavior: Behavior normal.  ? ? ?ED Results / Procedures / Treatments   ?Labs ?(all labs ordered are listed, but only abnormal results are displayed) ?Labs Reviewed  ?URINALYSIS, ROUTINE W REFLEX MICROSCOPIC - Abnormal; Notable for the following components:  ?    Result Value  ? Leukocytes,Ua LARGE (*)   ? Bacteria, UA RARE (*)   ? All other components within normal limits  ?COMPREHENSIVE METABOLIC PANEL - Abnormal; Notable for the following components:  ? Creatinine, Ser 1.09 (*)   ? GFR, Estimated 48 (*)   ? All other components within normal limits  ?LIPASE, BLOOD - Abnormal; Notable for the following components:  ? Lipase 65 (*)   ? All other components within normal limits  ?URINE CULTURE  ?CBC WITH DIFFERENTIAL/PLATELET  ?TROPONIN I (HIGH SENSITIVITY)  ? ? ?EKG ?None ? ?Radiology ?CT ABDOMEN PELVIS W CONTRAST ? ?Result Date: 08/02/2021 ?CLINICAL DATA:  Recurrent or complicated urinary tract infection. Right CVA pain and tenderness. Evaluate for pyelonephritis versus kidney stone. EXAM: CT ABDOMEN AND PELVIS WITH CONTRAST TECHNIQUE: Multidetector CT imaging of the abdomen and pelvis was performed using the standard protocol following bolus administration of intravenous contrast. RADIATION DOSE REDUCTION: This exam was performed according to the departmental dose-optimization program which includes automated exposure control, adjustment of the mA and/or kV  according to patient size and/or use of iterative reconstruction technique. CONTRAST:  67m OMNIPAQUE IOHEXOL 300 MG/ML  SOLN COMPARISON:  05/23/2021 FINDINGS: Lower chest: Scarring in the lung bases. Hepatobiliary: Surgical absence of the gallbladder. Mild intrahepatic bile duct dilatation is likely physiologic post cholecystectomy. Similar appearance to previous study. No focal lesions. Pancreas: Unremarkable. No pancreatic ductal dilatation or surrounding inflammatory changes. Spleen: Normal in size without focal abnormality. Adrenals/Urinary Tract: No adrenal gland nodules. Renal nephrograms are symmetrical. Benign-appearing right renal cyst measuring 2.7 cm diameter, unchanged. No follow-up is indicated. Mildly dilated ureters without hydronephrosis bilaterally. This may indicate reflux disease. No inflammatory stranding is appreciated. Bladder is unremarkable. Stomach/Bowel: The stomach is not abnormally distended but is filled  with ingested material. Small bowel and colon are not abnormally distended. No wall thickening or inflammatory changes are appreciated. Appendix is not identified. Vascular/Lymphatic: Aortic atherosclerosis. No enlarged abdominal or pelvic lymph nodes. Reproductive: Uterus is not enlarged. 2.2 cm cyst on the right ovary is unchanged since prior study. No follow-up imaging is recommended. Other: No free air or free fluid in the abdomen. Abdominal wall musculature appears intact. Musculoskeletal: Previous internal fixation of the left hip. Degenerative changes in the spine. Previous kyphoplasty at L2. No destructive bone lesions. IMPRESSION: 1. Mild intrahepatic bile duct dilatation is likely physiologic post cholecystectomy. 2. Mildly dilated ureters bilaterally. No obstructing stone or mass identified. Possible reflux disease. Bladder is unremarkable. 3. Aortic atherosclerosis. Electronically Signed   By: Lucienne Capers M.D.   On: 08/02/2021 20:47  ? ?CT L-SPINE NO CHARGE ? ?Result  Date: 08/02/2021 ?CLINICAL DATA:  Back pain beginning last night. EXAM: CT LUMBAR SPINE WITHOUT CONTRAST TECHNIQUE: Multidetector CT imaging of the lumbar spine was performed without intravenous contrast administrat

## 2021-08-02 NOTE — Telephone Encounter (Signed)
The patient states she has not received her new monitor yet. I told her if it do not come by Friday then to call back. ?

## 2021-08-02 NOTE — ED Notes (Signed)
Patient transported to CT 

## 2021-08-02 NOTE — ED Notes (Signed)
EMT-P provided AVS using Teachback Method. Patient verbalizes understanding of Discharge Instructions. Opportunity for Questioning and Answers were provided by EMT-P. Patient Discharged from ED.  ? ?

## 2021-08-02 NOTE — Discharge Instructions (Addendum)
You were diagnosed with a urine infection.  Your next dose of antibiotics due tomorrow. ? ?You are also having back pain, which we did not find a clear cause for on your CT scan or blood test.  It may be related to your infection.  I prescribed some stronger pain medicine to take at home with oxycodone every 8 hours as needed, along with pericolace once daily to prevent constipation. ? ?Please call your primary care doctor's office tomorrow to arrange for a follow-up visit. ?

## 2021-08-02 NOTE — ED Triage Notes (Signed)
Patient here POV from Home with Back Pain. ? ?Patient endorses Pain. To Mid Right Back that began last PM. Constant in [place] since. Ache in [place]. ? ?Tylenol not effective. No Acute Trauma noted. No CP or SOB. No Known Fevers. ? ?NAD Noted during Triage. A&Ox4. GCS 15. BIB Wheelchair.  ?

## 2021-08-05 LAB — URINE CULTURE: Culture: 50000 — AB

## 2021-08-06 ENCOUNTER — Telehealth: Payer: Self-pay

## 2021-08-06 NOTE — Telephone Encounter (Signed)
Post ED Visit - Positive Culture Follow-up: Successful Patient Follow-Up ? ?Culture assessed and recommendations reviewed by: ? ?'[x]'$  Bertis Ruddy, Pharm.D. ?'[]'$  Heide Guile, Pharm.D., BCPS AQ-ID ?'[]'$  Parks Neptune, Pharm.D., BCPS ?'[]'$  Alycia Rossetti, Pharm.D., BCPS ?'[]'$  [place], Pharm.D., BCPS, AAHIVP ?'[]'$  Legrand Como, Pharm.D., BCPS, AAHIVP ?'[]'$  Salome Arnt, PharmD, BCPS ?'[]'$  Johnnette Gourd, PharmD, BCPS ?'[]'$  Hughes Better, PharmD, BCPS ?'[]'$  Leeroy Cha, PharmD ? ?Positive urine culture ? ?'[]'$  Patient discharged without antimicrobial prescription and treatment is now indicated ?'[x]'$  Organism is resistant to prescribed ED discharge antimicrobial ?'[]'$  Patient with positive blood cultures ? ?Changes discussed with ED provider: Debbe Mounts PA-C ?New antibiotic prescription Amoxicillin 500 mg po Q 8 hours x 5 days ?Called to [facility] on [address] ? ?Contacted patient, date 08/06/21, time 1:30 pm ? ? ?Glennon Hamilton ?08/06/2021, 1:30 PM ? ?  ?

## 2021-08-06 NOTE — Progress Notes (Signed)
ED Antimicrobial Stewardship Positive Culture Follow Up  ? ?[name] Small is an 88 y.o. female who presented to [facility] on 08/02/2021 with a chief complaint of  ?Chief Complaint  ?Patient presents with  ? Back Pain  ? ? ?Recent Results (from the past 720 hour(s))  ?Urine Culture     Status: Abnormal  ? Collection Time: 08/02/21  6:36 PM  ? Specimen: Urine, Clean Catch  ?Result Value Ref Range Status  ? Specimen Description   Final  ?  URINE, CLEAN CATCH ?Performed at [facility], [address] [place], [place] [address] ?  ? Special Requests   Final  ?  NONE ?Performed at [facility], [address] [place], [place] [address] ?  ? Culture 50,000 COLONIES/mL ENTEROCOCCUS FAECALIS (A)  Final  ? Report Status 08/05/2021 FINAL  Final  ? Organism ID, Bacteria ENTEROCOCCUS FAECALIS (A)  Final  ?    Susceptibility  ? Enterococcus faecalis - MIC*  ?  AMPICILLIN <=2 SENSITIVE Sensitive   ?  NITROFURANTOIN <=16 SENSITIVE Sensitive   ?  VANCOMYCIN 2 SENSITIVE Sensitive   ?  * 50,000 COLONIES/mL ENTEROCOCCUS FAECALIS  ? ? ?'[x]'$  Treated with keflex, organism resistant to prescribed antimicrobial ? ? ?New antibiotic prescription: Amoxil ? ?ED Provider: Debbe Mounts, PA-C ? ? ?[name] Small ?08/06/2021, 10:49 AM ?Clinical Pharmacist ?Monday - Friday phone -  [phone] ?Saturday - Sunday phone - [phone] ? ?

## 2021-08-09 NOTE — Telephone Encounter (Signed)
I called tech support with the patient on the phone and they ordered her a new handheld. She should receive it in 2-4 business days. ?

## 2021-08-10 ENCOUNTER — Other Ambulatory Visit: Payer: Self-pay

## 2021-08-10 ENCOUNTER — Inpatient Hospital Stay (HOSPITAL_COMMUNITY)
Admission: EM | Admit: 2021-08-10 | Discharge: 2021-08-12 | DRG: 689 | Disposition: A | Discharge status: Home Health | Payer: Medicare Other | Attending: Internal Medicine | Admitting: Internal Medicine

## 2021-08-10 ENCOUNTER — Emergency Department (HOSPITAL_COMMUNITY): Payer: Medicare Other

## 2021-08-10 ENCOUNTER — Encounter (HOSPITAL_COMMUNITY): Payer: Self-pay

## 2021-08-10 DIAGNOSIS — I10 Essential (primary) hypertension: Secondary | ICD-10-CM | POA: Diagnosis present

## 2021-08-10 DIAGNOSIS — I129 Hypertensive chronic kidney disease with stage 1 through stage 4 chronic kidney disease, or unspecified chronic kidney disease: Secondary | ICD-10-CM | POA: Diagnosis not present

## 2021-08-10 DIAGNOSIS — Z888 Allergy status to other drugs, medicaments and biological substances status: Secondary | ICD-10-CM | POA: Diagnosis not present

## 2021-08-10 DIAGNOSIS — N1832 Chronic kidney disease, stage 3b: Secondary | ICD-10-CM | POA: Diagnosis present

## 2021-08-10 DIAGNOSIS — M810 Age-related osteoporosis without current pathological fracture: Secondary | ICD-10-CM | POA: Diagnosis not present

## 2021-08-10 DIAGNOSIS — Z79899 Other long term (current) drug therapy: Secondary | ICD-10-CM | POA: Diagnosis not present

## 2021-08-10 DIAGNOSIS — N3000 Acute cystitis without hematuria: Secondary | ICD-10-CM

## 2021-08-10 DIAGNOSIS — Z9841 Cataract extraction status, right eye: Secondary | ICD-10-CM

## 2021-08-10 DIAGNOSIS — M549 Dorsalgia, unspecified: Secondary | ICD-10-CM | POA: Diagnosis not present

## 2021-08-10 DIAGNOSIS — G9341 Metabolic encephalopathy: Secondary | ICD-10-CM | POA: Diagnosis present

## 2021-08-10 DIAGNOSIS — Z8249 Family history of ischemic heart disease and other diseases of the circulatory system: Secondary | ICD-10-CM

## 2021-08-10 DIAGNOSIS — Z950 Presence of cardiac pacemaker: Secondary | ICD-10-CM | POA: Diagnosis not present

## 2021-08-10 DIAGNOSIS — Z7901 Long term (current) use of anticoagulants: Secondary | ICD-10-CM | POA: Diagnosis not present

## 2021-08-10 DIAGNOSIS — I495 Sick sinus syndrome: Secondary | ICD-10-CM | POA: Diagnosis not present

## 2021-08-10 DIAGNOSIS — N390 Urinary tract infection, site not specified: Principal | ICD-10-CM | POA: Diagnosis present

## 2021-08-10 DIAGNOSIS — R410 Disorientation, unspecified: Secondary | ICD-10-CM

## 2021-08-10 DIAGNOSIS — E782 Mixed hyperlipidemia: Secondary | ICD-10-CM

## 2021-08-10 DIAGNOSIS — Z961 Presence of intraocular lens: Secondary | ICD-10-CM | POA: Diagnosis present

## 2021-08-10 DIAGNOSIS — R739 Hyperglycemia, unspecified: Secondary | ICD-10-CM | POA: Diagnosis not present

## 2021-08-10 DIAGNOSIS — N1831 Chronic kidney disease, stage 3a: Secondary | ICD-10-CM | POA: Diagnosis present

## 2021-08-10 DIAGNOSIS — E860 Dehydration: Secondary | ICD-10-CM

## 2021-08-10 DIAGNOSIS — Z9842 Cataract extraction status, left eye: Secondary | ICD-10-CM | POA: Diagnosis not present

## 2021-08-10 DIAGNOSIS — B952 Enterococcus as the cause of diseases classified elsewhere: Secondary | ICD-10-CM | POA: Diagnosis present

## 2021-08-10 DIAGNOSIS — Z820 Family history of epilepsy and other diseases of the nervous system: Secondary | ICD-10-CM | POA: Diagnosis not present

## 2021-08-10 DIAGNOSIS — I482 Chronic atrial fibrillation, unspecified: Secondary | ICD-10-CM | POA: Diagnosis not present

## 2021-08-10 DIAGNOSIS — R4182 Altered mental status, unspecified: Secondary | ICD-10-CM

## 2021-08-10 DIAGNOSIS — N179 Acute kidney failure, unspecified: Secondary | ICD-10-CM | POA: Diagnosis present

## 2021-08-10 DIAGNOSIS — N3281 Overactive bladder: Secondary | ICD-10-CM

## 2021-08-10 DIAGNOSIS — E785 Hyperlipidemia, unspecified: Secondary | ICD-10-CM | POA: Diagnosis not present

## 2021-08-10 DIAGNOSIS — K219 Gastro-esophageal reflux disease without esophagitis: Secondary | ICD-10-CM

## 2021-08-10 DIAGNOSIS — Z96642 Presence of left artificial hip joint: Secondary | ICD-10-CM | POA: Diagnosis present

## 2021-08-10 DIAGNOSIS — Z8739 Personal history of other diseases of the musculoskeletal system and connective tissue: Secondary | ICD-10-CM | POA: Diagnosis not present

## 2021-08-10 LAB — CBC WITH DIFFERENTIAL/PLATELET
Abs Immature Granulocytes: 0.05 10*3/uL (ref 0.00–0.07)
Basophils Absolute: 0 10*3/uL (ref 0.0–0.1)
Basophils Relative: 0 %
Eosinophils Absolute: 0 10*3/uL (ref 0.0–0.5)
Eosinophils Relative: 0 %
HCT: 44.6 % (ref 36.0–46.0)
Hemoglobin: 14.1 g/dL (ref 12.0–15.0)
Immature Granulocytes: 1 %
Lymphocytes Relative: 11 %
Lymphs Abs: 1.2 10*3/uL (ref 0.7–4.0)
MCH: 30.5 pg (ref 26.0–34.0)
MCHC: 31.6 g/dL (ref 30.0–36.0)
MCV: 96.5 fL (ref 80.0–100.0)
Monocytes Absolute: 0.3 10*3/uL (ref 0.1–1.0)
Monocytes Relative: 3 %
Neutro Abs: 8.9 10*3/uL — ABNORMAL HIGH (ref 1.7–7.7)
Neutrophils Relative %: 85 %
Platelets: 206 10*3/uL (ref 150–400)
RBC: 4.62 MIL/uL (ref 3.87–5.11)
RDW: 13.8 % (ref 11.5–15.5)
WBC: 10.5 10*3/uL (ref 4.0–10.5)
nRBC: 0 % (ref 0.0–0.2)

## 2021-08-10 LAB — URINALYSIS, ROUTINE W REFLEX MICROSCOPIC
Bilirubin Urine: NEGATIVE
Glucose, UA: >=500 mg/dL — AB
Ketones, ur: 5 mg/dL — AB
Nitrite: NEGATIVE
Protein, ur: 100 mg/dL — AB
Specific Gravity, Urine: 1.022 (ref 1.005–1.030)
WBC, UA: >50 WBC/hpf — ABNORMAL HIGH (ref 0–5)
pH: 5 (ref 5.0–8.0)

## 2021-08-10 LAB — COMPREHENSIVE METABOLIC PANEL
ALT: 21 U/L (ref 0–44)
AST: 26 U/L (ref 15–41)
Albumin: 4.6 g/dL (ref 3.5–5.0)
Alkaline Phosphatase: 78 U/L (ref 38–126)
Anion gap: 10 (ref 5–15)
BUN: 20 mg/dL (ref 8–23)
CO2: 26 mmol/L (ref 22–32)
Calcium: 9.2 mg/dL (ref 8.9–10.3)
Chloride: 100 mmol/L (ref 98–111)
Creatinine, Ser: 1.01 mg/dL — ABNORMAL HIGH (ref 0.44–1.00)
GFR, Estimated: 53 mL/min — ABNORMAL LOW (ref >60)
Glucose, Bld: 229 mg/dL — ABNORMAL HIGH (ref 70–99)
Potassium: 4.1 mmol/L (ref 3.5–5.1)
Sodium: 136 mmol/L (ref 135–145)
Total Bilirubin: 0.7 mg/dL (ref 0.3–1.2)
Total Protein: 7.9 g/dL (ref 6.5–8.1)

## 2021-08-10 LAB — TROPONIN I (HIGH SENSITIVITY)
Troponin I (High Sensitivity): 10 ng/L (ref <18)
Troponin I (High Sensitivity): 11 ng/L (ref <18)

## 2021-08-10 MED ORDER — SODIUM CHLORIDE 0.9 % IV SOLN
1.0000 g | Freq: Once | INTRAVENOUS | Status: AC
  Administered 2021-08-10: 1 g via INTRAVENOUS
  Filled 2021-08-10: qty 10

## 2021-08-10 MED ORDER — SODIUM CHLORIDE 0.9 % IV SOLN: INTRAVENOUS | Status: DC

## 2021-08-10 MED ORDER — VANCOMYCIN HCL 1250 MG/250ML IV SOLN
1250.0000 mg | Freq: Once | INTRAVENOUS | Status: AC
  Administered 2021-08-10: 1250 mg via INTRAVENOUS
  Filled 2021-08-10: qty 250

## 2021-08-10 MED ORDER — VANCOMYCIN HCL 1250 MG/250ML IV SOLN: 1250.0000 mg | INTRAVENOUS | Status: DC

## 2021-08-10 NOTE — Consult Note (Addendum)
Pharmacy Antibiotic Note ? ?[name] Small is a 88 y.o. female admitted on 08/10/2021 with enterococcus UTI.  Pharmacy has been consulted for vancomycin dosing. ? ?Plan: ?Vanco '1250mg'$  LD followed by '1250mg'$  q48hrs ? AUC=541.7 ? Cmax=44.2 ? Cmin=10.0 ?Dosing per AUC calculator ? ?Height: '5\' 5"'$  (165.1 cm) ?Weight: 50 kg (110 lb 3.2 oz) ?IBW/kg (Calculated) : 57 ? ?Temp (24hrs), Avg:97.7 ?F (36.5 ?C), Min:97.7 ?F (36.5 ?C), Max:97.7 ?F (36.5 ?C) ? ?Recent Labs  ?Lab 08/10/21 ?1556  ?WBC 10.5  ?CREATININE 1.01*  ?  ?Estimated Creatinine Clearance: 29.2 mL/min (A) (by C-G formula based on SCr of 1.01 mg/dL (H)).   ? ?Allergies  ?Allergen Reactions  ? Septra Ds [Sulfamethoxazole-Trimethoprim]   ? Lisinopril Other (See Comments)  ?  Cough  ? ? ?Antimicrobials this admission: ?0322 Ceftriaxone 1gm >> one dose ?0322 Vanco '1250mg'$  LD, vanco '1250mg'$  q 48hrs >>  ? ?Dose adjustments this admission: ?N/A ? ?Microbiology results: ? BCx: N/A ?34 UCx: entercoccus faecalis ?0322 Ucx:  Pending ? ?Thank you for allowing pharmacy to be a part of this patient?s care. ? ?Berta Minor ?08/10/2021 7:32 PM ? ?

## 2021-08-10 NOTE — Assessment & Plan Note (Addendum)
Continue amiodarone, Lopressor and Xarelto ?

## 2021-08-10 NOTE — Plan of Care (Signed)
Patient received via stretcher to unit, alert and oriented x3, able to transfer to bed with assist of one person, patient gait unsteady. Purewick placed at patient request, skin check performed by 2 nurses and patient oriented to room, staff, call bell, and unit.  ? ? ? ?Problem: Urinary Elimination: ?Goal: Signs and symptoms of infection will decrease ?Outcome: Progressing ?  ?Problem: Education: ?Goal: Knowledge of General Education information will improve ?Description: Including pain rating scale, medication(s)/side effects and non-pharmacologic comfort measures ?Outcome: Progressing ?  ?Problem: Health Behavior/Discharge Planning: ?Goal: Ability to manage health-related needs will improve ?Outcome: Progressing ?  ?Problem: Clinical Measurements: ?Goal: Ability to maintain clinical measurements within normal limits will improve ?Outcome: Progressing ?Goal: Will remain free from infection ?Outcome: Progressing ?Goal: Diagnostic test results will improve ?Outcome: Progressing ?Goal: Respiratory complications will improve ?Outcome: Progressing ?Goal: Cardiovascular complication will be avoided ?Outcome: Progressing ?  ?Problem: Activity: ?Goal: Risk for activity intolerance will decrease ?Outcome: Progressing ?  ?Problem: Nutrition: ?Goal: Adequate nutrition will be maintained ?Outcome: Progressing ?  ?Problem: Coping: ?Goal: Level of anxiety will decrease ?Outcome: Progressing ?  ?Problem: Elimination: ?Goal: Will not experience complications related to bowel motility ?Outcome: Progressing ?Goal: Will not experience complications related to urinary retention ?Outcome: Progressing ?  ?Problem: Pain Managment: ?Goal: General experience of comfort will improve ?Outcome: Progressing ?  ?Problem: Safety: ?Goal: Ability to remain free from injury will improve ?Outcome: Progressing ?  ?Problem: Skin Integrity: ?Goal: Risk for impaired skin integrity will decrease ?Outcome: Progressing ?  ?

## 2021-08-10 NOTE — Assessment & Plan Note (Addendum)
Secondary to UTI, resolved and back to baseline ?

## 2021-08-10 NOTE — Assessment & Plan Note (Signed)
Continue Protonix °

## 2021-08-10 NOTE — Assessment & Plan Note (Addendum)
Patient complained of about 2-week onset of burning sensation on urination and bladder discomfort.  She was recently diagnosed with UTI  ?Urine culture done on 08/02/2021 showed enterococcus faecalis ?Urine culture done on 05/23/2021 was positive for E. coli ?Urine culture done on 04/07/2021 was positive for E. coli ?-Patient has recurrent UTI episodes and was empirically started on IV Rocephin and vancomycin ?-Urine cultures with insignificant growth, will transition to nitrofurantoin based on prior sensitivities and maintain on this with urology outpatient follow-up ?

## 2021-08-10 NOTE — Assessment & Plan Note (Signed)
-  Continue Lipitor °

## 2021-08-10 NOTE — ED Provider Notes (Signed)
?[facility] ?Provider Note ? ? ?CSN: [id number] ?Arrival date & time: 08/10/21  1537 ? ?  ? ?History ? ?Chief Complaint  ?Patient presents with  ? Altered Mental Status  ? ? ?[name] Small is a 88 y.o. female. ? ?The history is provided by the patient and a relative.  ?Altered Mental Status ?Presenting symptoms: confusion   ?Patient brought in with mental status change.  Reportedly was at home eating and had mental status change.  Was still looking around but became nonverbal.  Would not really look at her son.  Would just play with some chips and pushed him around on the table and dropped them.  Still was moving both sides.  States it took around half an hour to get her here.  Came by private vehicle.  Patient only complaining of right lower back pain.  Does not remember the episode.  Patient's mental status improving now and can talk but reportedly not quite back to baseline.  Has recently been diagnosed with UTI.  Had to switch of antibiotics due to resistance but had not filled the new antibiotic. ?  ? ?Home Medications ?Prior to Admission medications   ?Medication Sig Start Date End Date Taking? Authorizing Provider  ?acetaminophen (TYLENOL) 500 MG tablet Take 1 tablet (500 mg total) by mouth every 6 (six) hours as needed. 03/07/21  Yes Varney Biles, MD  ?amiodarone (PACERONE) 200 MG tablet TAKE 1 TABLET(200 MG) BY MOUTH DAILY ?Patient taking differently: Take 200 mg by mouth daily with lunch. 02/07/21  Yes Josue Hector, MD  ?atorvastatin (LIPITOR) 40 MG tablet TAKE 1 TABLET(40 MG) BY MOUTH DAILY ?Patient taking differently: Take 40 mg by mouth daily with lunch. 02/11/21  Yes Billie Ruddy, MD  ?cephALEXin (KEFLEX) 500 MG capsule Take 1 capsule (500 mg total) by mouth 4 (four) times daily. 05/23/21  Yes Carlisle Cater, PA-C  ?docusate sodium (COLACE) 100 MG capsule Take 1 capsule (100 mg total) by mouth 2 (two) times daily. 03/22/21  Yes Barb Merino, MD  ?metoprolol tartrate (LOPRESSOR)  25 MG tablet Take 1.5 tablets (37.5 mg total) by mouth 2 (two) times daily. 06/01/21  Yes Billie Ruddy, MD  ?ondansetron (ZOFRAN) 4 MG tablet Take 1 tablet (4 mg total) by mouth daily as needed for nausea or vomiting. 04/11/21 04/11/22 Yes Antonieta Pert, MD  ?oxyCODONE (ROXICODONE) 5 MG immediate release tablet Take 1 tablet (5 mg total) by mouth every 8 (eight) hours as needed for up to 10 doses for severe pain. 08/02/21  Yes Trifan, Carola Rhine, MD  ?pantoprazole (PROTONIX) 40 MG tablet Take 1 tablet (40 mg total) by mouth daily. 04/11/21 08/10/21 Yes Antonieta Pert, MD  ?senna-docusate (SENOKOT-S) 8.6-50 MG tablet Take 1 tablet by mouth daily as needed for up to 15 doses for mild constipation. To prevent constipation when taking oxycodone 08/02/21  Yes Trifan, Carola Rhine, MD  ?XARELTO 15 MG TABS tablet TAKE 1 TABLET(15 MG) BY MOUTH DAILY WITH DINNER ?Patient taking differently: Take 15 mg by mouth daily with lunch. 09/27/20  Yes Josue Hector, MD  ?calcitonin, salmon, (MIACALCIN) 200 UNIT/ACT nasal spray Place 1 spray into alternate nostrils daily. ?Patient not taking: Reported on 08/10/2021 03/11/21   Billie Ruddy, MD  ?losartan (COZAAR) 100 MG tablet Take 1 tablet (100 mg total) by mouth daily. ?Patient not taking: Reported on 08/10/2021 04/12/21   Antonieta Pert, MD  ?oxyCODONE (OXY IR/ROXICODONE) 5 MG immediate release tablet Take 1 tablet (5 mg total)  by mouth every 4 (four) hours as needed for severe pain. ?Patient not taking: Reported on 05/04/2021 03/22/21   Barb Merino, MD  ?   ? ?Allergies    ?Septra ds [sulfamethoxazole-trimethoprim] and Lisinopril   ? ?Review of Systems   ?Review of Systems  ?Genitourinary:  Positive for flank pain.  ?Musculoskeletal:  Positive for back pain.  ?Neurological:  Positive for speech difficulty.  ?Psychiatric/Behavioral:  Positive for confusion.   ? ?Physical Exam ?Updated Vital Signs ?BP (!) 170/71   Pulse (!) 59   Temp 97.7 ?F (36.5 ?C) (Oral)   Resp 18   Ht '5\' 5"'$  (1.651 m)    Wt 50 kg   SpO2 100%   BMI 18.34 kg/m?  ?Physical Exam ?Vitals and nursing note reviewed.  ?Constitutional:   ?   Appearance: Normal appearance.  ?Eyes:  ?   Pupils: Pupils are equal, round, and reactive to light.  ?Cardiovascular:  ?   Rate and Rhythm: Regular rhythm.  ?Pulmonary:  ?   Breath sounds: No wheezing.  ?Abdominal:  ?   Tenderness: There is no abdominal tenderness.  ?Musculoskeletal:  ?   Cervical back: Neck supple.  ?   Right lower leg: No edema.  ?   Left lower leg: No edema.  ?Skin: ?   General: Skin is warm.  ?Neurological:  ?   Comments: Awake and answering questions.  Able to identify her son.  Able to tell me events but does not remember the events that happened.  ? ? ?ED Results / Procedures / Treatments   ?Labs ?(all labs ordered are listed, but only abnormal results are displayed) ?Labs Reviewed  ?COMPREHENSIVE METABOLIC PANEL - Abnormal; Notable for the following components:  ?    Result Value  ? Glucose, Bld 229 (*)   ? Creatinine, Ser 1.01 (*)   ? GFR, Estimated 53 (*)   ? All other components within normal limits  ?CBC WITH DIFFERENTIAL/PLATELET - Abnormal; Notable for the following components:  ? Neutro Abs 8.9 (*)   ? All other components within normal limits  ?URINALYSIS, ROUTINE W REFLEX MICROSCOPIC - Abnormal; Notable for the following components:  ? APPearance HAZY (*)   ? Glucose, UA >=500 (*)   ? Hgb urine dipstick MODERATE (*)   ? Ketones, ur 5 (*)   ? Protein, ur 100 (*)   ? Leukocytes,Ua LARGE (*)   ? WBC, UA >50 (*)   ? Bacteria, UA RARE (*)   ? All other components within normal limits  ?TROPONIN I (HIGH SENSITIVITY)  ?TROPONIN I (HIGH SENSITIVITY)  ? ? ?EKG ?None ? ?Radiology ?CT Head Wo Contrast ? ?Result Date: 08/10/2021 ?CLINICAL DATA:  Altered mental status. EXAM: CT HEAD WITHOUT CONTRAST TECHNIQUE: Contiguous axial images were obtained from the base of the skull through the vertex without intravenous contrast. RADIATION DOSE REDUCTION: This exam was performed according  to the departmental dose-optimization program which includes automated exposure control, adjustment of the mA and/or kV according to patient size and/or use of iterative reconstruction technique. COMPARISON:  CT head dated July 11, 2021. FINDINGS: Brain: No evidence of acute infarction, hemorrhage, hydrocephalus, extra-axial collection or mass lesion/mass effect. Stable moderate atrophy and chronic microvascular ischemic changes. 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 Vascular: Calcified atherosclerosis at the skull base. No hyperdense vessel. Skull: Normal. Negative for fracture or focal lesion. Sinuses/Orbits: No acute finding. Other: None. IMPRESSION: 1. No acute intracranial abnormality. Electronically Signed   By: Orville Govern.D.  On: 08/10/2021 16:45   ? ?Procedures ?Procedures  ? ? ?Medications Ordered in ED ?Medications - No data to display ? ?ED Course/ Medical Decision Making/ A&P ?  ?                        ?Medical Decision Making ?Problems Addressed: ?Urinary tract infection without hematuria, site unspecified: complicated acute illness or injury ? ?Amount and/or Complexity of Data Reviewed ?Labs: ordered. ?Radiology: ordered. ?ECG/medicine tests: independent interpretation performed. ? ?Patient brought in for mental status change.  Reviewed recent lab work and showed Enterococcus UTI.  Had had switch from antibiotic she had been on to a different to properly cover but however had not started new antibiotic because she wanted to continue the old 1.  Had episode of confusion.  Was not talking.  Still moving around all extremities.  Reportedly had another episode here with more confusion.  Head CT done and independently interpreted is reassuring.  Creatinine reassuring glucose mildly elevated.  However urine shows likely infection again.  Has had about 2 months ago and E. coli UTI and Enterococcus recently.  With increasing mental status change I feel as if she would benefit from  admission in the hospital.  Will discuss with hospitalist.  I reviewed previous notes. ? ? ? ? ? ? ? ?Final Clinical Impression(s) / ED Diagnoses ?Final diagnoses:  ?Altered mental status, unspecified altered menta

## 2021-08-10 NOTE — Assessment & Plan Note (Addendum)
Improved, likely reactive ?Hemoglobin A1c 5.6% ? ?

## 2021-08-10 NOTE — ED Triage Notes (Signed)
Pt arrived with son . Son believed pt had a stroke. Staff helped pt out of car and Dr assessed pt for stroke. Son c/o weakness, confusion, dazed looking around and unable to walk. Son says pt walks with a walker at home and lives alone. Pt is verbally c/o back pain on right lower side.  ?

## 2021-08-10 NOTE — Assessment & Plan Note (Signed)
Creatinine is within baseline range ?Renally adjust medications, avoid nephrotoxic agents/dehydration/hypotension ? ?

## 2021-08-10 NOTE — Assessment & Plan Note (Addendum)
BP controlled ?Continue Lopressor and losartan ?

## 2021-08-10 NOTE — Assessment & Plan Note (Addendum)
Resolved

## 2021-08-10 NOTE — H&P (Signed)
?History and Physical  ? ? ?Patient: [name] Small DOB: 22-Sep-1928 ?DOA: 08/10/2021 ?DOS: the patient was seen and examined on 08/10/2021 ?PCP: Billie Ruddy, MD  ?Patient coming from: Home ? ?Chief Complaint:  ?Chief Complaint  ?Patient presents with  ? Altered Mental Status  ? ?HPI: [name] Small is a 88 y.o. female with medical history significant of   atrial fibrillation on Xarelto, sick sinus syndrome with pacer, mild renal insufficiency, hypertension, and vertebral compression fracture status post vertebroplasty on 03/21/2021 who presents to the emergency department due to altered mental status which started few hours prior to arrival to the ED. Patient complained of 2-week onset of burning sensation with urination and pain related to her bladder which worsens when she gets up at night to use the restroom.  ED medical record, patient was reported to develop an altered mental status while eating this afternoon during which she kept looking around, avoided looking at her son and was nonverbal for about 30 minutes, she was playing with chips during which she moved them around on the table and dropped them, but there was no focal weakness noted other than right lower back pain which patient complained of.  Son decided to take patient to the emergency room for further evaluation and management in a private vehicle.  There was no report of headaches, fever, chills, nausea, vomiting, chest pain, shortness of breath. ? ?ED course: ?In emergency department, she was bradycardic, BP was 168/104, but other vital signs were within normal range.  Work-up in the ED showed normal CBC, BMP was normal except for hyperglycemia, troponin x2 was negative, urinalysis was suspected to be due to UTI. ?CT head without contrast showed no acute intracranial abnormality ?She was treated with IV ceftriaxone and vancomycin.  Hospitalist was asked to admit patient for further evaluation and management. ? ?Review of Systems: As  mentioned in the history of present illness. All other systems reviewed and are negative. ?Past Medical History:  ?Diagnosis Date  ? Atrial fibrillation ([place])   ? Bleeding stomach ulcer 1980s?  ? Cancer ([place]) Meloanoma left leg  ? DIVERTICULOSIS, COLON 11/22/2006  ? Headache(784.0)   ? "very often; not regular" (11/25/2015)  ? History of blood transfusion 1980s  ? "when I had the bleeding ulcer"  ? HYPERLIPIDEMIA 11/22/2006  ? Hypertension   ? Menopausal syndrome (hot flashes)   ? OSTEOPOROSIS 11/22/2006  ? Presence of permanent cardiac pacemaker   ? ?Past Surgical History:  ?Procedure Laterality Date  ? APPENDECTOMY    ? BUNIONECTOMY Right   ? CATARACT EXTRACTION W/ INTRAOCULAR LENS  IMPLANT, BILATERAL Bilateral 2000s  ? CHOLECYSTECTOMY OPEN    ? EP IMPLANTABLE DEVICE N/A 11/25/2015  ? Procedure: Pacemaker Implant;  Surgeon: Will Meredith Leeds, MD;  Location: [facility];  Service: Cardiovascular;  Laterality: N/A;  ? FEMUR IM NAIL Left 07/08/2014  ? Procedure: INTRAMEDULLARY (IM) NAIL ;  Surgeon: Meredith Pel, MD;  Location: [facility];  Service: Orthopedics;  Laterality: Left;  ? FRACTURE SURGERY    ? IR KYPHO LUMBAR INC FX REDUCE BONE BX UNI/BIL CANNULATION INC/IMAGING  03/21/2021  ? REVISION TOTAL HIP ARTHROPLASTY Left 06/2014  ? ?Social History:  reports that she has never smoked. She has never been exposed to tobacco smoke. She has never used smokeless tobacco. She reports that she does not drink alcohol and does not use drugs. ? ?Allergies  ?Allergen Reactions  ? Septra Ds [Sulfamethoxazole-Trimethoprim]   ? Lisinopril Other (See  Comments)  ?  Cough  ? ? ?Family History  ?Problem Relation Age of Onset  ? Alzheimer's disease Sister   ? Cardiomyopathy Brother   ? ? ?Prior to Admission medications   ?Medication Sig Start Date End Date Taking? Authorizing Provider  ?acetaminophen (TYLENOL) 500 MG tablet Take 1 tablet (500 mg total) by mouth every 6 (six) hours as needed. 03/07/21  Yes Varney Biles, MD   ?amiodarone (PACERONE) 200 MG tablet TAKE 1 TABLET(200 MG) BY MOUTH DAILY ?Patient taking differently: Take 200 mg by mouth daily with lunch. 02/07/21  Yes Josue Hector, MD  ?atorvastatin (LIPITOR) 40 MG tablet TAKE 1 TABLET(40 MG) BY MOUTH DAILY ?Patient taking differently: Take 40 mg by mouth daily with lunch. 02/11/21  Yes Billie Ruddy, MD  ?cephALEXin (KEFLEX) 500 MG capsule Take 1 capsule (500 mg total) by mouth 4 (four) times daily. 05/23/21  Yes Carlisle Cater, PA-C  ?docusate sodium (COLACE) 100 MG capsule Take 1 capsule (100 mg total) by mouth 2 (two) times daily. 03/22/21  Yes Barb Merino, MD  ?metoprolol tartrate (LOPRESSOR) 25 MG tablet Take 1.5 tablets (37.5 mg total) by mouth 2 (two) times daily. 06/01/21  Yes Billie Ruddy, MD  ?ondansetron (ZOFRAN) 4 MG tablet Take 1 tablet (4 mg total) by mouth daily as needed for nausea or vomiting. 04/11/21 04/11/22 Yes Antonieta Pert, MD  ?oxyCODONE (ROXICODONE) 5 MG immediate release tablet Take 1 tablet (5 mg total) by mouth every 8 (eight) hours as needed for up to 10 doses for severe pain. 08/02/21  Yes Trifan, Carola Rhine, MD  ?pantoprazole (PROTONIX) 40 MG tablet Take 1 tablet (40 mg total) by mouth daily. 04/11/21 08/10/21 Yes Antonieta Pert, MD  ?senna-docusate (SENOKOT-S) 8.6-50 MG tablet Take 1 tablet by mouth daily as needed for up to 15 doses for mild constipation. To prevent constipation when taking oxycodone 08/02/21  Yes Trifan, Carola Rhine, MD  ?XARELTO 15 MG TABS tablet TAKE 1 TABLET(15 MG) BY MOUTH DAILY WITH DINNER ?Patient taking differently: Take 15 mg by mouth daily with lunch. 09/27/20  Yes Josue Hector, MD  ?calcitonin, salmon, (MIACALCIN) 200 UNIT/ACT nasal spray Place 1 spray into alternate nostrils daily. ?Patient not taking: Reported on 08/10/2021 03/11/21   Billie Ruddy, MD  ?losartan (COZAAR) 100 MG tablet Take 1 tablet (100 mg total) by mouth daily. ?Patient not taking: Reported on 08/10/2021 04/12/21   Antonieta Pert, MD  ?oxyCODONE (OXY  IR/ROXICODONE) 5 MG immediate release tablet Take 1 tablet (5 mg total) by mouth every 4 (four) hours as needed for severe pain. ?Patient not taking: Reported on 05/04/2021 03/22/21   Barb Merino, MD  ? ? ?Physical Exam: ?Vitals:  ? 08/10/21 1830 08/10/21 1900 08/10/21 2000 08/10/21 2044  ?BP: (!) 155/123 (!) 170/71 (!) 158/95 (!) 185/95  ?Pulse: 61 (!) 59 60 62  ?Resp: '18 18 13 18  '$ ?Temp:   98.4 ?F (36.9 ?C) 98.4 ?F (36.9 ?C)  ?TempSrc:    Oral  ?SpO2: 97% 100% 98% 100%  ?Weight:      ?Height:      ? ?General: Elderly female. Awake and alert and oriented x3. Not in any acute distress.  ?HEENT: NCAT.  PERRLA. EOMI. Sclerae anicteric.  Dry mucosal membranes. ?Neck: Neck supple without lymphadenopathy. No carotid bruits. No masses palpated.  ?Cardiovascular: Irregular rate with normal S1-S2 sounds. No rubs or gallops auscultated. No JVD.  ?Respiratory: Clear breath sounds.  No accessory muscle use. ?Abdomen: Soft, nontender, nondistended. Active  bowel sounds. No masses or hepatosplenomegaly  ?Skin: No rashes, lesions, or ulcerations.  Dry, warm to touch. ?Musculoskeletal:  2+ dorsalis pedis and radial pulses. Good ROM.  No contractures  ?Psychiatric: Intact judgment and insight.  Mood appropriate to current condition. ?Neurologic: No focal neurological deficits. Strength is 5/5 x 4.  CN II - XII grossly intact. ? ?Data Reviewed: ?EKG showed normal sinus rhythm at a rate of 60 bpm ? ?Assessment and Plan: ?* UTI (urinary tract infection) ?Patient complained of about 2-week onset of burning sensation on urination and bladder discomfort.  She was recently diagnosed with UTI  ?Urine culture done on 08/02/2021 showed enterococcus faecalis ?Urine culture done on 05/23/2021 was positive for E. coli ?Urine culture done on 04/07/2021 was positive for E. coli ?Patient's initial antibiotic was recently switched and patient was yet to fill the prescription (patient does not remember the name of the medication) ?Patient was  empirically started on IV vancomycin and ceftriaxone, patient continue with same at this time with plan to de-escalate/discontinue based on Urine culture (pending). ? ?Altered mental status ?This may be due to the patien

## 2021-08-10 NOTE — ED Notes (Signed)
Patient assisted to bedside commode at this time.

## 2021-08-11 ENCOUNTER — Inpatient Hospital Stay (HOSPITAL_COMMUNITY): Payer: Medicare Other

## 2021-08-11 LAB — COMPREHENSIVE METABOLIC PANEL
ALT: 15 U/L (ref 0–44)
AST: 20 U/L (ref 15–41)
Albumin: 3.6 g/dL (ref 3.5–5.0)
Alkaline Phosphatase: 61 U/L (ref 38–126)
Anion gap: 6 (ref 5–15)
BUN: 14 mg/dL (ref 8–23)
CO2: 26 mmol/L (ref 22–32)
Calcium: 8.9 mg/dL (ref 8.9–10.3)
Chloride: 107 mmol/L (ref 98–111)
Creatinine, Ser: 0.82 mg/dL (ref 0.44–1.00)
GFR, Estimated: >60 mL/min (ref >60)
Glucose, Bld: 95 mg/dL (ref 70–99)
Potassium: 4.2 mmol/L (ref 3.5–5.1)
Sodium: 139 mmol/L (ref 135–145)
Total Bilirubin: 0.4 mg/dL (ref 0.3–1.2)
Total Protein: 6.4 g/dL — ABNORMAL LOW (ref 6.5–8.1)

## 2021-08-11 LAB — CBC
HCT: 38.8 % (ref 36.0–46.0)
Hemoglobin: 11.9 g/dL — ABNORMAL LOW (ref 12.0–15.0)
MCH: 29 pg (ref 26.0–34.0)
MCHC: 30.7 g/dL (ref 30.0–36.0)
MCV: 94.4 fL (ref 80.0–100.0)
Platelets: 161 10*3/uL (ref 150–400)
RBC: 4.11 MIL/uL (ref 3.87–5.11)
RDW: 13.6 % (ref 11.5–15.5)
WBC: 8 10*3/uL (ref 4.0–10.5)
nRBC: 0 % (ref 0.0–0.2)

## 2021-08-11 LAB — APTT: aPTT: 30 seconds (ref 24–36)

## 2021-08-11 LAB — MAGNESIUM: Magnesium: 2.3 mg/dL (ref 1.7–2.4)

## 2021-08-11 LAB — HEMOGLOBIN A1C
Hgb A1c MFr Bld: 5.6 % (ref 4.8–5.6)
Mean Plasma Glucose: 114.02 mg/dL

## 2021-08-11 LAB — PHOSPHORUS: Phosphorus: 2.9 mg/dL (ref 2.5–4.6)

## 2021-08-11 MED ORDER — ACETAMINOPHEN 325 MG PO TABS
650.0000 mg | ORAL_TABLET | Freq: Four times a day (QID) | ORAL | Status: DC | PRN
Start: 2021-08-11 — End: 2021-08-12
  Administered 2021-08-11 (×3): 650 mg via ORAL
  Filled 2021-08-11 (×2): qty 2

## 2021-08-11 MED ORDER — OXYCODONE HCL 5 MG PO TABS
5.0000 mg | ORAL_TABLET | Freq: Three times a day (TID) | ORAL | Status: DC | PRN
  Administered 2021-08-11: 5 mg via ORAL
  Filled 2021-08-11: qty 1

## 2021-08-11 MED ORDER — ACETAMINOPHEN 325 MG PO TABS
ORAL_TABLET | ORAL | Status: AC
  Filled 2021-08-11: qty 2

## 2021-08-11 MED ORDER — ATORVASTATIN CALCIUM 40 MG PO TABS
40.0000 mg | ORAL_TABLET | Freq: Every day | ORAL | Status: DC
  Administered 2021-08-11 – 2021-08-12 (×2): 40 mg via ORAL
  Filled 2021-08-11 (×2): qty 1

## 2021-08-11 MED ORDER — METOPROLOL TARTRATE 25 MG PO TABS
37.5000 mg | ORAL_TABLET | Freq: Two times a day (BID) | ORAL | Status: DC
  Administered 2021-08-11 – 2021-08-12 (×3): 37.5 mg via ORAL
  Filled 2021-08-11 (×3): qty 2

## 2021-08-11 MED ORDER — SODIUM CHLORIDE 0.9 % IV SOLN: 1.0000 g | Freq: Once | INTRAVENOUS | Status: DC

## 2021-08-11 MED ORDER — AMIODARONE HCL 200 MG PO TABS
200.0000 mg | ORAL_TABLET | Freq: Every day | ORAL | Status: DC
  Administered 2021-08-11 – 2021-08-12 (×2): 200 mg via ORAL
  Filled 2021-08-11 (×2): qty 1

## 2021-08-11 MED ORDER — RIVAROXABAN 15 MG PO TABS
15.0000 mg | ORAL_TABLET | Freq: Every day | ORAL | Status: DC
  Administered 2021-08-11 – 2021-08-12 (×2): 15 mg via ORAL
  Filled 2021-08-11 (×2): qty 1

## 2021-08-11 MED ORDER — SODIUM CHLORIDE 0.9 % IV SOLN
1.0000 g | INTRAVENOUS | Status: DC
  Administered 2021-08-11 – 2021-08-12 (×2): 1 g via INTRAVENOUS
  Filled 2021-08-11 (×2): qty 10

## 2021-08-11 MED ORDER — PANTOPRAZOLE SODIUM 40 MG PO TBEC
40.0000 mg | DELAYED_RELEASE_TABLET | Freq: Every day | ORAL | Status: DC
  Administered 2021-08-11 – 2021-08-12 (×2): 40 mg via ORAL
  Filled 2021-08-11 (×2): qty 1

## 2021-08-11 MED ORDER — HYDRALAZINE HCL 20 MG/ML IJ SOLN
10.0000 mg | INTRAMUSCULAR | Status: DC | PRN
Start: 2021-08-11 — End: 2021-08-12
  Administered 2021-08-11: 10 mg via INTRAVENOUS
  Filled 2021-08-11: qty 1

## 2021-08-11 NOTE — TOC Initial Note (Signed)
Transition of Care (TOC) - Initial/Assessment Note  ? ? ?Patient Details  ?Name: [name] Small ?MRN: [id number] ?Date of Birth: December 10, 1928 ? ?Transition of Care (TOC) CM/SW Contact:    ?[name], LCSWA ?Phone Number: ?08/11/2021, 10:50 AM ? ?Clinical Narrative:                  ?Pt is high risk for readmission. CSW spoke with pts son Mr. Zeiter to complete assessment. Pt lives alone but has someone coming into the house 3 days a week to assist as needed. Mr. Santillo also states that he lives next door to his mother. Mr. Matuska provides transportation as needed for pt. Pt has had HH in the past but Mr. Propps is unsure of the company used. Pt has a walker to use when ambulating. TOC to follow for needs.  ? ?Expected Discharge Plan: [place] ?Barriers to Discharge: Continued Medical Work up ? ? ?Patient Goals and CMS Choice ?Patient states their goals for this hospitalization and ongoing recovery are:: Return home ?CMS Medicare.gov Compare Post Acute Care list provided to:: Patient Represenative (must comment) ?Choice offered to / list presented to : Adult Children ? ?Expected Discharge Plan and Services ?Expected Discharge Plan: [place] ?In-house Referral: Clinical Social Work ?Discharge Planning Services: CM Consult ?  ?Living arrangements for the past 2 months: [place] ?                ?  ?  ?  ?  ?  ?  ?  ?  ?  ?  ? ?Prior Living Arrangements/Services ?Living arrangements for the past 2 months: [place] ?Lives with:: Self ?Patient language and need for interpreter reviewed:: Yes ?Do you feel safe going back to the place where you live?: Yes      ?Need for Family Participation in Patient Care: Yes (Comment) ?Care giver support system in place?: Yes (comment) ?Current home services: DME ?Criminal Activity/Legal Involvement Pertinent to Current Situation/Hospitalization: No - Comment as needed ? ?Activities of Daily Living ?Home Assistive Devices/Equipment: Gilford Rile  (specify type) ?ADL Screening (condition at time of admission) ?Patient's cognitive ability adequate to safely complete daily activities?: Yes ?Is the patient deaf or have difficulty hearing?: No ?Does the patient have difficulty seeing, even when wearing glasses/contacts?: No ?Does the patient have difficulty concentrating, remembering, or making decisions?: Yes ?Patient able to express need for assistance with ADLs?: No ?Does the patient have difficulty dressing or bathing?: Yes ?Independently performs ADLs?: No ?Communication: Independent ?Dressing (OT): Needs assistance ?Is this a change from baseline?: Pre-admission baseline ?Grooming: Needs assistance ?Is this a change from baseline?: Pre-admission baseline ?Feeding: Independent ?Bathing: Needs assistance ?Is this a change from baseline?: Pre-admission baseline ?Toileting: Needs assistance ?Is this a change from baseline?: Pre-admission baseline ?In/Out Bed: Needs assistance ?Is this a change from baseline?: Pre-admission baseline ?Walks in Home: Needs assistance ?Is this a change from baseline?: Pre-admission baseline ?Does the patient have difficulty walking or climbing stairs?: Yes ?Weakness of Legs: Both ?Weakness of Arms/Hands: Both ? ?Permission Sought/Granted ?  ?  ?   ?   ?   ?   ? ?Emotional Assessment ?Appearance:: Appears stated age ?  ?  ?  ?Alcohol / Substance Use: Not Applicable ?Psych Involvement: No (comment) ? ?Admission diagnosis:  UTI (urinary tract infection) [N39.0] ?Urinary tract infection without hematuria, site unspecified [N39.0] ?Altered mental status, unspecified altered mental status type [R41.82] ?Patient Active Problem List  ?  Diagnosis Date Noted  ? UTI (urinary tract infection) 08/10/2021  ? Altered mental status 08/10/2021  ? GERD (gastroesophageal reflux disease) 08/10/2021  ? Syncope 04/06/2021  ? Stage 3a chronic kidney disease (CKD) ([place]) 04/06/2021  ? COVID-19 virus infection 04/06/2021  ? Compression fracture of L2 ([place])  03/17/2021  ? Lumbar compression fracture ([place]) 03/17/2021  ? Hypertension   ? Low back pain 03/16/2021  ? Hypertensive urgency 03/16/2021  ? PAF (paroxysmal atrial fibrillation) ([place]) 03/16/2021  ? Compression fracture of L2 lumbar vertebra (H[facility])   ? Porokeratosis 05/02/2019  ? Hav (hallux abducto valgus), unspecified laterality 05/02/2019  ? Atrial fibrillation, chronic ([place]) 11/25/2015  ? Hyperglycemia 12/07/2014  ? Atrial fibrillation with RVR-CHADs VASc=4 12/07/2014  ? Intertrochanteric fracture of left hip ([place]) 07/08/2014  ? Overactive bladder 04/02/2012  ? Essential hypertension 04/02/2012  ? Chest pain, atypical 09/17/2011  ? Dehydration 09/16/2011  ? Hyperlipidemia 11/22/2006  ? DIVERTICULOSIS, COLON 11/22/2006  ? Osteoporosis 11/22/2006  ? ?PCP:  Billie Ruddy, MD ?Pharmacy:   ?[facility] [address] ?[employer] [place] ?[place] [address] ?Phone: [phone] Fax: [phone] ? ? ? ? ?Social Determinants of Health (SDOH) Interventions ?  ? ?Readmission Risk Interventions ? ?  08/11/2021  ? 10:49 AM  ?Readmission Risk Prevention Plan  ?Transportation Screening Complete  ?[place] or Home Care Consult Complete  ?Social Work Consult for [place] Planning/Counseling Complete  ?Palliative Care Screening Not Applicable  ?Medication Review Press photographer) Complete  ? ? ? ?

## 2021-08-11 NOTE — Progress Notes (Signed)
?PROGRESS NOTE ? ? ? ?[name] Small  MWU:[id number] DOB: [birth year] DOA: 08/10/2021 ?PCP: Billie Ruddy, MD ? ? ?Brief Narrative:  ?Per HPI: ? MALAY FANTROY is a 88 y.o. female with medical history significant of   atrial fibrillation on Xarelto, sick sinus syndrome with pacer, mild renal insufficiency, hypertension, and vertebral compression fracture status post vertebroplasty on 03/21/2021 who presents to the emergency department due to altered mental status which started few hours prior to arrival to the ED. Patient complained of 2-week onset of burning sensation with urination and pain related to her bladder which worsens when she gets up at night to use the restroom.  ED medical record, patient was reported to develop an altered mental status while eating this afternoon during which she kept looking around, avoided looking at her son and was nonverbal for about 30 minutes, she was playing with chips during which she moved them around on the table and dropped them, but there was no focal weakness noted other than right lower back pain which patient complained of.  Son decided to take patient to the emergency room for further evaluation and management in a private vehicle.  There was no report of headaches, fever, chills, nausea, vomiting, chest pain, shortness of breath. ? ?08/11/21: Patient was admitted with acute metabolic encephalopathy in the setting of UTI and has been started on vancomycin and Rocephin.  Prior cultures on 314 with noted Enterococcus faecalis.  She continues to have some mild ongoing confusion.  Urine cultures are pending.  ? ? ?Assessment & Plan: ?  ?Principal Problem: ?  UTI (urinary tract infection) ?Active Problems: ?  Altered mental status ?  Dehydration ?  Hyperglycemia ?  Hyperlipidemia ?  Essential hypertension ?  Atrial fibrillation, chronic ([place]) ?  Stage 3a chronic kidney disease (CKD) ([place]) ?  GERD (gastroesophageal reflux disease) ? ?Assessment and Plan: ? ? ?Acute metabolic  encephalopathy 2/2 UTI (urinary tract infection) ?Patient complained of about 2-week onset of burning sensation on urination and bladder discomfort.  She was recently diagnosed with UTI  ?Urine culture done on 08/02/2021 showed enterococcus faecalis ?Urine culture done on 05/23/2021 was positive for E. coli ?Urine culture done on 04/07/2021 was positive for E. coli ?Patient's initial antibiotic was recently switched and patient was yet to fill the prescription (patient does not remember the name of the medication) ?Patient was empirically started on IV vancomycin and ceftriaxone, patient continue with same at this time with plan to de-escalate/discontinue based on Urine culture (pending). ?-Follow up with Urology upon discharge for recurrent UTI and likely discharge on nitrofurantoin '50mg'$  daily ?  ?  ?Hyperglycemia ?CBG 229, patient denies any history of type 2 diabetes mellitus, this may be a reactive process ?Hemoglobin A1c 5.6% ?  ?Dehydration ?Continue IV hydration ?  ?GERD (gastroesophageal reflux disease) ?Continue Protonix ?  ?Stage 3a chronic kidney disease (CKD) ([place]) ?Creatinine is within baseline range ?Renally adjust medications, avoid nephrotoxic agents/dehydration/hypotension ?  ?  ?Atrial fibrillation, chronic ([place]) ?Continue Lopressor and Xarelto ?  ?Essential hypertension ?BP uncontrolled ?Continue Lopressor, losartan ?Hydralazine as needed ?  ?Hyperlipidemia ?Continue Lipitor ?  ? ?DVT prophylaxis: Xarelto ?Code Status: Full ?Family Communication: Discussed with son on phone 3/23 ?Disposition Plan:  ?Status is: Inpatient ?Remains inpatient appropriate because: IV medications. ? ? ?Consultants:  ?None ? ?Procedures:  ?None ? ?Antimicrobials:  ?Anti-infectives (From admission, onward)  ? ? Start     Dose/Rate Route Frequency Ordered Stop  ? 08/12/21 1600  vancomycin (  VANCOREADY) IVPB 1250 mg/250 mL       ? 1,250 mg ?166.7 mL/hr over 90 Minutes Intravenous Every 48 hours 08/10/21 1931    ? 08/11/21 1000   cefTRIAXone (ROCEPHIN) 1 g in sodium chloride 0.9 % 100 mL IVPB       ? 1 g ?200 mL/hr over 30 Minutes Intravenous Every 24 hours 08/11/21 0718    ? 08/11/21 0800  cefTRIAXone (ROCEPHIN) 1 g in sodium chloride 0.9 % 100 mL IVPB  Status:  Discontinued       ? 1 g ?200 mL/hr over 30 Minutes Intravenous  Once 08/11/21 0706 08/11/21 0717  ? 08/10/21 2000  vancomycin (VANCOREADY) IVPB 1250 mg/250 mL       ? 1,250 mg ?166.7 mL/hr over 90 Minutes Intravenous  Once 08/10/21 1931 08/10/21 2200  ? 08/10/21 1930  cefTRIAXone (ROCEPHIN) 1 g in sodium chloride 0.9 % 100 mL IVPB       ? 1 g ?200 mL/hr over 30 Minutes Intravenous  Once 08/10/21 1917 08/10/21 2014  ? ?  ? ? ?Subjective: ?Patient seen and evaluated today with no new acute complaints or concerns. No acute concerns or events noted overnight.  She still has some ongoing confusion. ? ?Objective: ?Vitals:  ? 08/10/21 2000 08/10/21 2044 08/11/21 0047 08/11/21 0541  ?BP: (!) 158/95 (!) 185/95 (!) 176/89 (!) 180/78  ?Pulse: 60 62 61 (!) 59  ?Resp: '13 18 17 17  '$ ?Temp: 98.4 ?F (36.9 ?C) 98.4 ?F (36.9 ?C) 98.2 ?F (36.8 ?C) 98 ?F (36.7 ?C)  ?TempSrc:  Oral Oral Oral  ?SpO2: 98% 100% 100% 100%  ?Weight:      ?Height:      ? ? ?Intake/Output Summary (Last 24 hours) at 08/11/2021 0923 ?Last data filed at 08/11/2021 3382 ?Gross per 24 hour  ?Intake 405.71 ml  ?Output 800 ml  ?Net -394.29 ml  ? ?Filed Weights  ? 08/10/21 1556  ?Weight: 50 kg  ? ? ?Examination: ? ?General exam: Appears calm and comfortable, confused ?Respiratory system: Clear to auscultation. Respiratory effort normal. ?Cardiovascular system: S1 & S2 heard, RRR.  ?Gastrointestinal system: Abdomen is soft ?Central nervous system: Alert and awake ?Extremities: No edema ?Skin: No significant lesions noted ?Psychiatry: Flat affect. ? ? ? ?Data Reviewed: I have personally reviewed following labs and imaging studies ? ?CBC: ?Recent Labs  ?Lab 08/10/21 ?1556 08/11/21 ?5053  ?WBC 10.5 8.0  ?NEUTROABS 8.9*  --   ?HGB 14.1 11.9*   ?HCT 44.6 38.8  ?MCV 96.5 94.4  ?PLT 206 161  ? ?Basic Metabolic Panel: ?Recent Labs  ?Lab 08/10/21 ?1556 08/11/21 ?9767  ?NA 136 139  ?K 4.1 4.2  ?CL 100 107  ?CO2 26 26  ?GLUCOSE 229* 95  ?BUN 20 14  ?CREATININE 1.01* 0.82  ?CALCIUM 9.2 8.9  ?MG  --  2.3  ?PHOS  --  2.9  ? ?GFR: ?Estimated Creatinine Clearance: 36 mL/min (by C-G formula based on SCr of 0.82 mg/dL). ?Liver Function Tests: ?Recent Labs  ?Lab 08/10/21 ?1556 08/11/21 ?3419  ?AST 26 20  ?ALT 21 15  ?ALKPHOS 78 61  ?BILITOT 0.7 0.4  ?PROT 7.9 6.4*  ?ALBUMIN 4.6 3.6  ? ?No results for input(s): LIPASE, AMYLASE in the last 168 hours. ?No results for input(s): AMMONIA in the last 168 hours. ?Coagulation Profile: ?No results for input(s): INR, PROTIME in the last 168 hours. ?Cardiac Enzymes: ?No results for input(s): CKTOTAL, CKMB, CKMBINDEX, TROPONINI in the last 168 hours. ?BNP (last 3 results) ?  No results for input(s): PROBNP in the last 8760 hours. ?HbA1C: ?Recent Labs  ?  08/11/21 ?1165  ?HGBA1C 5.6  ? ?CBG: ?No results for input(s): GLUCAP in the last 168 hours. ?Lipid Profile: ?No results for input(s): CHOL, HDL, LDLCALC, TRIG, CHOLHDL, LDLDIRECT in the last 72 hours. ?Thyroid Function Tests: ?No results for input(s): TSH, T4TOTAL, FREET4, T3FREE, THYROIDAB in the last 72 hours. ?Anemia Panel: ?No results for input(s): VITAMINB12, FOLATE, FERRITIN, TIBC, IRON, RETICCTPCT in the last 72 hours. ?Sepsis Labs: ?No results for input(s): PROCALCITON, LATICACIDVEN in the last 168 hours. ? ?Recent Results (from the past 240 hour(s))  ?Urine Culture     Status: Abnormal  ? Collection Time: 08/02/21  6:36 PM  ? Specimen: Urine, Clean Catch  ?Result Value Ref Range Status  ? Specimen Description   Final  ?  URINE, CLEAN CATCH ?Performed at [facility], [address] [place], [place] [address] ?  ? Special Requests   Final  ?  NONE ?Performed at [facility], [address] [place], [place] [address] ?  ? Culture  50,000 COLONIES/mL ENTEROCOCCUS FAECALIS (A)  Final  ? Report Status 08/05/2021 FINAL  Final  ? Organism ID, Bacteria ENTEROCOCCUS FAECALIS (A)  Final  ?    Susceptibility  ? Enterococcus faecalis - MIC*  ?  A

## 2021-08-11 NOTE — Hospital Course (Addendum)
Per HPI: ? [name] Small is a 88 y.o. female with medical history significant of   atrial fibrillation on Xarelto, sick sinus syndrome with pacer, mild renal insufficiency, hypertension, and vertebral compression fracture status post vertebroplasty on 03/21/2021 who presents to the emergency department due to altered mental status which started few hours prior to arrival to the ED. Patient complained of 2-week onset of burning sensation with urination and pain related to her bladder which worsens when she gets up at night to use the restroom.  ED medical record, patient was reported to develop an altered mental status while eating this afternoon during which she kept looking around, avoided looking at her son and was nonverbal for about 30 minutes, she was playing with chips during which she moved them around on the table and dropped them, but there was no focal weakness noted other than right lower back pain which patient complained of.  Son decided to take patient to the emergency room for further evaluation and management in a private vehicle.  There was no report of headaches, fever, chills, nausea, vomiting, chest pain, shortness of breath. ? ?08/11/21: Patient was admitted with acute metabolic encephalopathy in the setting of UTI and has been started on vancomycin and Rocephin.  Prior cultures on 314 with noted Enterococcus faecalis.  She continues to have some mild ongoing confusion.  Urine cultures are pending. ? ?08/12/2021: Mentation has improved and patient is back to baseline.  She is in stable condition for discharge.  Urine cultures with no significant growth noted.  She will be transition to nitrofurantoin based on prior cultures and have urology outpatient follow-up due to recurrent UTIs.  She will remain on nitrofurantoin long-term to help prevent UTI recurrence.  Home health PT will be reinitiated. ?

## 2021-08-12 LAB — URINE CULTURE: Culture: <10000 — AB

## 2021-08-12 MED ORDER — NITROFURANTOIN MONOHYD MACRO 100 MG PO CAPS
100.0000 mg | ORAL_CAPSULE | Freq: Two times a day (BID) | ORAL | Status: DC
  Administered 2021-08-12: 100 mg via ORAL
  Filled 2021-08-12: qty 1

## 2021-08-12 MED ORDER — NITROFURANTOIN MONOHYD MACRO 100 MG PO CAPS
100.0000 mg | ORAL_CAPSULE | Freq: Every day | ORAL | 0 refills | Status: AC
Start: 2021-08-12 — End: 2021-09-11

## 2021-08-12 NOTE — TOC Transition Note (Signed)
Transition of Care (TOC) - CM/SW Discharge Note ? ? ?Patient Details  ?Name: [name] Small ?MRN: [id number] ?Date of Birth: [birth year] ? ?Transition of Care (TOC) CM/SW Contact:  ?[name], LCSWA ?Phone Number: ?08/12/2021, 12:07 PM ? ? ?Clinical Narrative:    ?CSW spoke to [place] with [facility] who states pt was a past pt of theirs. [facility] can accept referral for [facility] PT services. Orders were placed for [facility]. CSW updated pts son that the [facility] will reach out to him. TOC singing off.  ? ?Final next level of care: [place] ?Barriers to Discharge: No Barriers Identified ? ? ?Patient Goals and CMS Choice ?Patient states their goals for this hospitalization and ongoing recovery are:: Return home with [facility] ?CMS Medicare.gov Compare Post Acute Care list provided to:: Patient Represenative (must comment) ?Choice offered to / list presented to : Adult Children ? ?Discharge Placement ?  ?           ?  ?  ?  ?  ? ?Discharge Plan and Services ?In-house Referral: Clinical Social Work ?Discharge Planning Services: CM Consult ?           ?  ?  ?  ?  ?  ?HH Arranged: PT ?[place] Agency: [facility] ?Date HH Agency Contacted: 08/12/21 ?  ?Representative spoke with at [place]: Marjory Lies ? ?Social Determinants of Health (SDOH) Interventions ?  ? ? ?Readmission Risk Interventions ? ?  08/11/2021  ? 10:49 AM  ?Readmission Risk Prevention Plan  ?Transportation Screening Complete  ?[place] or Home Care Consult Complete  ?Social Work Consult for [place] Planning/Counseling Complete  ?Palliative Care Screening Not Applicable  ?Medication Review Press photographer) Complete  ? ? ? ? ? ?

## 2021-08-12 NOTE — Progress Notes (Signed)
Family at bedside and voiced understanding of discharge instructions.  IV's removed and scripts sent to pharmacy.  Transported by WC to car. Family to drive home ?

## 2021-08-12 NOTE — Discharge Summary (Signed)
?Physician Discharge Summary ?  ?Patient: [name] Small MRN: [id number] DOB: 07-19-1928  ?Admit date:     08/10/2021  ?Discharge date: 08/12/21  ?Discharge Physician: Rodena Goldmann  ? ?PCP: Billie Ruddy, MD  ? ?Recommendations at discharge:  ? ?Continue on nitrofurantoin indefinitely to prevent recurrent UTIs and follow-up with urology with referral sent ?Continue other home medications as prior ? ?Discharge Diagnoses: ?Principal Problem: ?  UTI (urinary tract infection) ?Active Problems: ?  Altered mental status ?  Dehydration ?  Hyperglycemia ?  Hyperlipidemia ?  Essential hypertension ?  Atrial fibrillation, chronic ([place]) ?  Stage 3a chronic kidney disease (CKD) ([place]) ?  GERD (gastroesophageal reflux disease) ? ?Resolved Problems: ?  * No resolved hospital problems. * ? ?Hospital Course: ?Per HPI: ? [name] Small is a 88 y.o. female with medical history significant of   atrial fibrillation on Xarelto, sick sinus syndrome with pacer, mild renal insufficiency, hypertension, and vertebral compression fracture status post vertebroplasty on 03/21/2021 who presents to the emergency department due to altered mental status which started few hours prior to arrival to the ED. Patient complained of 2-week onset of burning sensation with urination and pain related to her bladder which worsens when she gets up at night to use the restroom.  ED medical record, patient was reported to develop an altered mental status while eating this afternoon during which she kept looking around, avoided looking at her son and was nonverbal for about 30 minutes, she was playing with chips during which she moved them around on the table and dropped them, but there was no focal weakness noted other than right lower back pain which patient complained of.  Son decided to take patient to the emergency room for further evaluation and management in a private vehicle.  There was no report of headaches, fever, chills, nausea, vomiting, chest pain,  shortness of breath. ? ?08/11/21: Patient was admitted with acute metabolic encephalopathy in the setting of UTI and has been started on vancomycin and Rocephin.  Prior cultures on 314 with noted Enterococcus faecalis.  She continues to have some mild ongoing confusion.  Urine cultures are pending. ? ?08/12/2021: Mentation has improved and patient is back to baseline.  She is in stable condition for discharge.  Urine cultures with no significant growth noted.  She will be transition to nitrofurantoin based on prior cultures and have urology outpatient follow-up due to recurrent UTIs.  She will remain on nitrofurantoin long-term to help prevent UTI recurrence.  Home health PT will be reinitiated. ? ?Assessment and Plan: ?* UTI (urinary tract infection) ?Patient complained of about 2-week onset of burning sensation on urination and bladder discomfort.  She was recently diagnosed with UTI  ?Urine culture done on 08/02/2021 showed enterococcus faecalis ?Urine culture done on 05/23/2021 was positive for E. coli ?Urine culture done on 04/07/2021 was positive for E. coli ?-Patient has recurrent UTI episodes and was empirically started on IV Rocephin and vancomycin ?-Urine cultures with insignificant growth, will transition to nitrofurantoin based on prior sensitivities and maintain on this with urology outpatient follow-up ? ?Altered mental status ?Secondary to UTI, resolved and back to baseline ? ?Hyperglycemia ?Improved, likely reactive ?Hemoglobin A1c 5.6% ? ? ?Dehydration ?Resolved ? ?GERD (gastroesophageal reflux disease) ?Continue Protonix ? ?Stage 3a chronic kidney disease (CKD) ([place]) ?Creatinine is within baseline range ?Renally adjust medications, avoid nephrotoxic agents/dehydration/hypotension ? ? ?Atrial fibrillation, chronic ([place]) ?Continue amiodarone, Lopressor and Xarelto ? ?Essential hypertension ?BP controlled ?Continue Lopressor and  losartan ? ?Hyperlipidemia ?Continue Lipitor ? ? ? ? ?  ? ? ?Consultants:  None ?Procedures performed: None ?Disposition: Home ?Diet recommendation:  ?Discharge Diet Orders (From admission, onward)  ? ?  Start     Ordered  ? 08/12/21 0000  Diet - low sodium heart healthy       ? 08/12/21 1023  ? ?  ?  ? ?  ? ?Cardiac diet ?DISCHARGE MEDICATION: ?Allergies as of 08/12/2021   ? ?   Reactions  ? Septra Ds [sulfamethoxazole-trimethoprim]   ? Lisinopril Other (See Comments)  ? Cough  ? ?  ? ?  ?Medication List  ?  ? ?STOP taking these medications   ? ?calcitonin (salmon) 200 UNIT/ACT nasal spray ?Commonly known as: Miacalcin ?  ?cephALEXin 500 MG capsule ?Commonly known as: KEFLEX ?  ?losartan 100 MG tablet ?Commonly known as: Cozaar ?  ? ?  ? ?TAKE these medications   ? ?acetaminophen 500 MG tablet ?Commonly known as: TYLENOL ?Take 1 tablet (500 mg total) by mouth every 6 (six) hours as needed. ?  ?amiodarone 200 MG tablet ?Commonly known as: PACERONE ?TAKE 1 TABLET(200 MG) BY MOUTH DAILY ?What changed: See the new instructions. ?  ?atorvastatin 40 MG tablet ?Commonly known as: LIPITOR ?TAKE 1 TABLET(40 MG) BY MOUTH DAILY ?What changed: See the new instructions. ?  ?docusate sodium 100 MG capsule ?Commonly known as: COLACE ?Take 1 capsule (100 mg total) by mouth 2 (two) times daily. ?  ?metoprolol tartrate 25 MG tablet ?Commonly known as: LOPRESSOR ?Take 1.5 tablets (37.5 mg total) by mouth 2 (two) times daily. ?  ?nitrofurantoin (macrocrystal-monohydrate) 100 MG capsule ?Commonly known as: MACROBID ?Take 1 capsule (100 mg total) by mouth at bedtime. ?  ?ondansetron 4 MG tablet ?Commonly known as: Zofran ?Take 1 tablet (4 mg total) by mouth daily as needed for nausea or vomiting. ?  ?oxyCODONE 5 MG immediate release tablet ?Commonly known as: Roxicodone ?Take 1 tablet (5 mg total) by mouth every 8 (eight) hours as needed for up to 10 doses for severe pain. ?What changed: Another medication with the same name was removed. Continue taking this medication, and follow the directions you see here. ?   ?pantoprazole 40 MG tablet ?Commonly known as: Protonix ?Take 1 tablet (40 mg total) by mouth daily. ?  ?senna-docusate 8.6-50 MG tablet ?Commonly known as: Senokot-S ?Take 1 tablet by mouth daily as needed for up to 15 doses for mild constipation. To prevent constipation when taking oxycodone ?  ?Xarelto 15 MG Tabs tablet ?Generic drug: Rivaroxaban ?TAKE 1 TABLET(15 MG) BY MOUTH DAILY WITH DINNER ?What changed: See the new instructions. ?  ? ?  ? ? ?Discharge Exam: ?Danley Danker Weights  ? 08/10/21 1556  ?Weight: 50 kg  ? ?Examination: ?Physical Exam: ? ?Constitutional: WN/WD, NAD and appears calm and comfortable ?Neck: Appears normal, supple, no cervical masses, normal ROM, no appreciable thyromegaly ?Respiratory: Clear to auscultation bilaterally, no wheezing, rales, rhonchi or crackles. Normal respiratory effort and patient is not tachypenic. No accessory muscle use.  ?Cardiovascular: RRR, no murmurs / rubs / gallops. S1 and S2 auscultated. No extremity edema. 2+ pedal pulses. No carotid bruits.  ?Abdomen: Soft, non-tender, non-distended. No masses palpated. No appreciable hepatosplenomegaly. Bowel sounds positive.  ?GU: Deferred. ?Musculoskeletal: No clubbing / cyanosis of digits/nails. No joint deformity upper and lower extremities. Good ROM, no contractures. Normal strength and muscle tone.  ?Skin: No rashes, lesions, ulcers. No induration; Warm and dry.  ?Neurologic: CN 2-12 grossly  intact with no focal deficits. Sensation intact in all 4 Extremities, DTR normal. Strength 5/5 in all 4. Romberg sign cerebellar reflexes not assessed.  ?Psychiatric: Normal judgment and insight. Alert and oriented x 3. Normal mood and appropriate affect.  ? ? ?Condition at discharge: stable ? ?The results of significant diagnostics from this hospitalization (including imaging, microbiology, ancillary and laboratory) are listed below for reference.  ? ?Imaging Studies: ?CT Head Wo Contrast ? ?Result Date: 08/10/2021 ?CLINICAL DATA:   Altered mental status. EXAM: CT HEAD WITHOUT CONTRAST TECHNIQUE: Contiguous axial images were obtained from the base of the skull through the vertex without intravenous contrast. RADIATION DOSE REDUCTION: This ex

## 2021-08-12 NOTE — Care Management Important Message (Signed)
Important Message ? ?Patient Details  ?Name: [name] Small ?MRN: [id number] ?Date of Birth: 04-15-1929 ? ? ?Medicare Important Message Given:  N/A - LOS <3 / Initial given by admissions ? ? ? ? ?Tommy Medal ?08/12/2021, 11:34 AM ?

## 2021-08-17 ENCOUNTER — Emergency Department (HOSPITAL_COMMUNITY): Payer: Medicare Other

## 2021-08-17 ENCOUNTER — Other Ambulatory Visit: Payer: Self-pay

## 2021-08-17 ENCOUNTER — Encounter (HOSPITAL_COMMUNITY): Payer: Self-pay | Admitting: Pharmacy Technician

## 2021-08-17 ENCOUNTER — Inpatient Hospital Stay (HOSPITAL_COMMUNITY)
Admission: EM | Admit: 2021-08-17 | Discharge: 2021-08-24 | DRG: 521 | Disposition: A | Discharge status: Skilled Nursing Facility | Payer: Medicare Other | Attending: Internal Medicine | Admitting: Internal Medicine

## 2021-08-17 ENCOUNTER — Ambulatory Visit (INDEPENDENT_AMBULATORY_CARE_PROVIDER_SITE_OTHER): Payer: Medicare Other

## 2021-08-17 DIAGNOSIS — I129 Hypertensive chronic kidney disease with stage 1 through stage 4 chronic kidney disease, or unspecified chronic kidney disease: Secondary | ICD-10-CM | POA: Diagnosis present

## 2021-08-17 DIAGNOSIS — R0689 Other abnormalities of breathing: Secondary | ICD-10-CM | POA: Diagnosis not present

## 2021-08-17 DIAGNOSIS — K219 Gastro-esophageal reflux disease without esophagitis: Secondary | ICD-10-CM | POA: Diagnosis present

## 2021-08-17 DIAGNOSIS — E559 Vitamin D deficiency, unspecified: Secondary | ICD-10-CM

## 2021-08-17 DIAGNOSIS — S72001A Fracture of unspecified part of neck of right femur, initial encounter for closed fracture: Secondary | ICD-10-CM | POA: Diagnosis not present

## 2021-08-17 DIAGNOSIS — N1831 Chronic kidney disease, stage 3a: Secondary | ICD-10-CM | POA: Diagnosis present

## 2021-08-17 DIAGNOSIS — G8194 Hemiplegia, unspecified affecting left nondominant side: Secondary | ICD-10-CM | POA: Diagnosis present

## 2021-08-17 DIAGNOSIS — R55 Syncope and collapse: Secondary | ICD-10-CM | POA: Diagnosis not present

## 2021-08-17 DIAGNOSIS — Z8616 Personal history of COVID-19: Secondary | ICD-10-CM | POA: Diagnosis not present

## 2021-08-17 DIAGNOSIS — I482 Chronic atrial fibrillation, unspecified: Secondary | ICD-10-CM | POA: Diagnosis present

## 2021-08-17 DIAGNOSIS — Z888 Allergy status to other drugs, medicaments and biological substances status: Secondary | ICD-10-CM

## 2021-08-17 DIAGNOSIS — M19071 Primary osteoarthritis, right ankle and foot: Secondary | ICD-10-CM | POA: Diagnosis not present

## 2021-08-17 DIAGNOSIS — I2510 Atherosclerotic heart disease of native coronary artery without angina pectoris: Secondary | ICD-10-CM | POA: Diagnosis present

## 2021-08-17 DIAGNOSIS — R262 Difficulty in walking, not elsewhere classified: Secondary | ICD-10-CM | POA: Diagnosis not present

## 2021-08-17 DIAGNOSIS — Z8249 Family history of ischemic heart disease and other diseases of the circulatory system: Secondary | ICD-10-CM

## 2021-08-17 DIAGNOSIS — Z950 Presence of cardiac pacemaker: Secondary | ICD-10-CM | POA: Diagnosis not present

## 2021-08-17 DIAGNOSIS — R29810 Facial weakness: Secondary | ICD-10-CM | POA: Diagnosis present

## 2021-08-17 DIAGNOSIS — G319 Degenerative disease of nervous system, unspecified: Secondary | ICD-10-CM | POA: Diagnosis not present

## 2021-08-17 DIAGNOSIS — R471 Dysarthria and anarthria: Secondary | ICD-10-CM | POA: Diagnosis present

## 2021-08-17 DIAGNOSIS — D696 Thrombocytopenia, unspecified: Secondary | ICD-10-CM | POA: Diagnosis present

## 2021-08-17 DIAGNOSIS — E876 Hypokalemia: Secondary | ICD-10-CM | POA: Diagnosis not present

## 2021-08-17 DIAGNOSIS — N179 Acute kidney failure, unspecified: Secondary | ICD-10-CM | POA: Diagnosis present

## 2021-08-17 DIAGNOSIS — M5459 Other low back pain: Secondary | ICD-10-CM | POA: Diagnosis not present

## 2021-08-17 DIAGNOSIS — Z961 Presence of intraocular lens: Secondary | ICD-10-CM | POA: Diagnosis not present

## 2021-08-17 DIAGNOSIS — R4701 Aphasia: Secondary | ICD-10-CM | POA: Diagnosis present

## 2021-08-17 DIAGNOSIS — Z9049 Acquired absence of other specified parts of digestive tract: Secondary | ICD-10-CM

## 2021-08-17 DIAGNOSIS — W1830XA Fall on same level, unspecified, initial encounter: Secondary | ICD-10-CM | POA: Diagnosis present

## 2021-08-17 DIAGNOSIS — Z66 Do not resuscitate: Secondary | ICD-10-CM | POA: Diagnosis not present

## 2021-08-17 DIAGNOSIS — I10 Essential (primary) hypertension: Secondary | ICD-10-CM | POA: Diagnosis not present

## 2021-08-17 DIAGNOSIS — E785 Hyperlipidemia, unspecified: Secondary | ICD-10-CM | POA: Diagnosis not present

## 2021-08-17 DIAGNOSIS — Z7901 Long term (current) use of anticoagulants: Secondary | ICD-10-CM | POA: Diagnosis not present

## 2021-08-17 DIAGNOSIS — I6522 Occlusion and stenosis of left carotid artery: Secondary | ICD-10-CM | POA: Diagnosis present

## 2021-08-17 DIAGNOSIS — I495 Sick sinus syndrome: Secondary | ICD-10-CM

## 2021-08-17 DIAGNOSIS — D62 Acute posthemorrhagic anemia: Secondary | ICD-10-CM

## 2021-08-17 DIAGNOSIS — L89301 Pressure ulcer of unspecified buttock, stage 1: Secondary | ICD-10-CM | POA: Diagnosis not present

## 2021-08-17 DIAGNOSIS — R2681 Unsteadiness on feet: Secondary | ICD-10-CM | POA: Diagnosis not present

## 2021-08-17 DIAGNOSIS — M800AXA Age-related osteoporosis with current pathological fracture, other site, initial encounter for fracture: Secondary | ICD-10-CM | POA: Diagnosis present

## 2021-08-17 DIAGNOSIS — I639 Cerebral infarction, unspecified: Secondary | ICD-10-CM | POA: Diagnosis not present

## 2021-08-17 DIAGNOSIS — Z741 Need for assistance with personal care: Secondary | ICD-10-CM | POA: Diagnosis not present

## 2021-08-17 DIAGNOSIS — R479 Unspecified speech disturbances: Secondary | ICD-10-CM | POA: Diagnosis not present

## 2021-08-17 DIAGNOSIS — S32511A Fracture of superior rim of right pubis, initial encounter for closed fracture: Secondary | ICD-10-CM | POA: Diagnosis not present

## 2021-08-17 DIAGNOSIS — N1832 Chronic kidney disease, stage 3b: Secondary | ICD-10-CM | POA: Diagnosis present

## 2021-08-17 DIAGNOSIS — R531 Weakness: Secondary | ICD-10-CM | POA: Diagnosis not present

## 2021-08-17 DIAGNOSIS — I6389 Other cerebral infarction: Secondary | ICD-10-CM | POA: Diagnosis not present

## 2021-08-17 DIAGNOSIS — S72031A Displaced midcervical fracture of right femur, initial encounter for closed fracture: Secondary | ICD-10-CM | POA: Diagnosis not present

## 2021-08-17 DIAGNOSIS — Z87440 Personal history of urinary (tract) infections: Secondary | ICD-10-CM

## 2021-08-17 DIAGNOSIS — R29818 Other symptoms and signs involving the nervous system: Secondary | ICD-10-CM | POA: Diagnosis not present

## 2021-08-17 DIAGNOSIS — R6889 Other general symptoms and signs: Secondary | ICD-10-CM | POA: Diagnosis not present

## 2021-08-17 DIAGNOSIS — I63412 Cerebral infarction due to embolism of left middle cerebral artery: Secondary | ICD-10-CM | POA: Diagnosis not present

## 2021-08-17 DIAGNOSIS — M80051A Age-related osteoporosis with current pathological fracture, right femur, initial encounter for fracture: Principal | ICD-10-CM | POA: Diagnosis present

## 2021-08-17 DIAGNOSIS — M898X9 Other specified disorders of bone, unspecified site: Secondary | ICD-10-CM | POA: Diagnosis present

## 2021-08-17 DIAGNOSIS — Z743 Need for continuous supervision: Secondary | ICD-10-CM | POA: Diagnosis not present

## 2021-08-17 DIAGNOSIS — D72829 Elevated white blood cell count, unspecified: Secondary | ICD-10-CM | POA: Diagnosis not present

## 2021-08-17 DIAGNOSIS — M6281 Muscle weakness (generalized): Secondary | ICD-10-CM | POA: Diagnosis not present

## 2021-08-17 DIAGNOSIS — I480 Paroxysmal atrial fibrillation: Secondary | ICD-10-CM | POA: Diagnosis not present

## 2021-08-17 DIAGNOSIS — L8990 Pressure ulcer of unspecified site, unspecified stage: Secondary | ICD-10-CM | POA: Insufficient documentation

## 2021-08-17 DIAGNOSIS — Z820 Family history of epilepsy and other diseases of the nervous system: Secondary | ICD-10-CM

## 2021-08-17 DIAGNOSIS — Y92009 Unspecified place in unspecified non-institutional (private) residence as the place of occurrence of the external cause: Secondary | ICD-10-CM

## 2021-08-17 DIAGNOSIS — Z9181 History of falling: Secondary | ICD-10-CM | POA: Diagnosis not present

## 2021-08-17 DIAGNOSIS — Z9842 Cataract extraction status, left eye: Secondary | ICD-10-CM

## 2021-08-17 DIAGNOSIS — D6832 Hemorrhagic disorder due to extrinsic circulating anticoagulants: Secondary | ICD-10-CM | POA: Diagnosis not present

## 2021-08-17 DIAGNOSIS — Z0389 Encounter for observation for other suspected diseases and conditions ruled out: Secondary | ICD-10-CM | POA: Diagnosis not present

## 2021-08-17 DIAGNOSIS — Z96642 Presence of left artificial hip joint: Secondary | ICD-10-CM | POA: Diagnosis present

## 2021-08-17 DIAGNOSIS — S32591A Other specified fracture of right pubis, initial encounter for closed fracture: Secondary | ICD-10-CM | POA: Diagnosis not present

## 2021-08-17 DIAGNOSIS — R5383 Other fatigue: Secondary | ICD-10-CM | POA: Diagnosis not present

## 2021-08-17 DIAGNOSIS — Z9841 Cataract extraction status, right eye: Secondary | ICD-10-CM

## 2021-08-17 DIAGNOSIS — Z79899 Other long term (current) drug therapy: Secondary | ICD-10-CM

## 2021-08-17 DIAGNOSIS — R29709 NIHSS score 9: Secondary | ICD-10-CM | POA: Diagnosis not present

## 2021-08-17 DIAGNOSIS — Z20822 Contact with and (suspected) exposure to covid-19: Secondary | ICD-10-CM | POA: Diagnosis not present

## 2021-08-17 DIAGNOSIS — I4891 Unspecified atrial fibrillation: Secondary | ICD-10-CM | POA: Diagnosis not present

## 2021-08-17 DIAGNOSIS — W19XXXA Unspecified fall, initial encounter: Secondary | ICD-10-CM | POA: Diagnosis not present

## 2021-08-17 DIAGNOSIS — M25551 Pain in right hip: Secondary | ICD-10-CM | POA: Diagnosis not present

## 2021-08-17 DIAGNOSIS — Z7401 Bed confinement status: Secondary | ICD-10-CM | POA: Diagnosis not present

## 2021-08-17 DIAGNOSIS — S3282XD Multiple fractures of pelvis without disruption of pelvic ring, subsequent encounter for fracture with routine healing: Secondary | ICD-10-CM | POA: Diagnosis not present

## 2021-08-17 DIAGNOSIS — I6602 Occlusion and stenosis of left middle cerebral artery: Secondary | ICD-10-CM | POA: Diagnosis not present

## 2021-08-17 DIAGNOSIS — M4857XD Collapsed vertebra, not elsewhere classified, lumbosacral region, subsequent encounter for fracture with routine healing: Secondary | ICD-10-CM | POA: Diagnosis not present

## 2021-08-17 DIAGNOSIS — D649 Anemia, unspecified: Secondary | ICD-10-CM | POA: Diagnosis not present

## 2021-08-17 DIAGNOSIS — K279 Peptic ulcer, site unspecified, unspecified as acute or chronic, without hemorrhage or perforation: Secondary | ICD-10-CM | POA: Diagnosis not present

## 2021-08-17 DIAGNOSIS — S72001D Fracture of unspecified part of neck of right femur, subsequent encounter for closed fracture with routine healing: Secondary | ICD-10-CM | POA: Diagnosis not present

## 2021-08-17 DIAGNOSIS — Z45018 Encounter for adjustment and management of other part of cardiac pacemaker: Secondary | ICD-10-CM

## 2021-08-17 DIAGNOSIS — R58 Hemorrhage, not elsewhere classified: Secondary | ICD-10-CM | POA: Diagnosis not present

## 2021-08-17 DIAGNOSIS — R000 Tachycardia, unspecified: Secondary | ICD-10-CM | POA: Diagnosis not present

## 2021-08-17 DIAGNOSIS — R404 Transient alteration of awareness: Secondary | ICD-10-CM | POA: Diagnosis not present

## 2021-08-17 DIAGNOSIS — S72009A Fracture of unspecified part of neck of unspecified femur, initial encounter for closed fracture: Secondary | ICD-10-CM

## 2021-08-17 DIAGNOSIS — I69328 Other speech and language deficits following cerebral infarction: Secondary | ICD-10-CM | POA: Diagnosis not present

## 2021-08-17 LAB — CUP PACEART REMOTE DEVICE CHECK
Battery Remaining Longevity: 57 mo
Battery Voltage: 2.99 V
Brady Statistic AP VP Percent: 0.08 %
Brady Statistic AP VS Percent: 96.66 %
Brady Statistic AS VP Percent: 0 %
Brady Statistic AS VS Percent: 3.25 %
Brady Statistic RA Percent Paced: 96.73 %
Brady Statistic RV Percent Paced: 0.09 %
Date Time Interrogation Session: 20230328141232
Implantable Lead Implant Date: 20170706
Implantable Lead Implant Date: 20170706
Implantable Lead Location: 753859
Implantable Lead Location: 753860
Implantable Lead Model: 5076
Implantable Lead Model: 5076
Implantable Pulse Generator Implant Date: 20170706
Lead Channel Impedance Value: 361 Ohm
Lead Channel Impedance Value: 456 Ohm
Lead Channel Impedance Value: 494 Ohm
Lead Channel Impedance Value: 665 Ohm
Lead Channel Pacing Threshold Amplitude: 0.75 V
Lead Channel Pacing Threshold Amplitude: 1.125 V
Lead Channel Pacing Threshold Pulse Width: 0.4 ms
Lead Channel Pacing Threshold Pulse Width: 0.4 ms
Lead Channel Sensing Intrinsic Amplitude: 1.875 mV
Lead Channel Sensing Intrinsic Amplitude: 1.875 mV
Lead Channel Sensing Intrinsic Amplitude: 22.375 mV
Lead Channel Sensing Intrinsic Amplitude: 22.375 mV
Lead Channel Setting Pacing Amplitude: 1.5 V
Lead Channel Setting Pacing Amplitude: 2.5 V
Lead Channel Setting Pacing Pulse Width: 0.4 ms
Lead Channel Setting Sensing Sensitivity: 2.8 mV

## 2021-08-17 LAB — COMPREHENSIVE METABOLIC PANEL
ALT: 35 U/L (ref 0–44)
AST: 37 U/L (ref 15–41)
Albumin: 3.8 g/dL (ref 3.5–5.0)
Alkaline Phosphatase: 81 U/L (ref 38–126)
Anion gap: 9 (ref 5–15)
BUN: 15 mg/dL (ref 8–23)
CO2: 25 mmol/L (ref 22–32)
Calcium: 9.1 mg/dL (ref 8.9–10.3)
Chloride: 103 mmol/L (ref 98–111)
Creatinine, Ser: 1.22 mg/dL — ABNORMAL HIGH (ref 0.44–1.00)
GFR, Estimated: 42 mL/min — ABNORMAL LOW (ref >60)
Glucose, Bld: 144 mg/dL — ABNORMAL HIGH (ref 70–99)
Potassium: 3.8 mmol/L (ref 3.5–5.1)
Sodium: 137 mmol/L (ref 135–145)
Total Bilirubin: 1 mg/dL (ref 0.3–1.2)
Total Protein: 6.7 g/dL (ref 6.5–8.1)

## 2021-08-17 LAB — DIFFERENTIAL
Abs Immature Granulocytes: 0.19 10*3/uL — ABNORMAL HIGH (ref 0.00–0.07)
Basophils Absolute: 0 10*3/uL (ref 0.0–0.1)
Basophils Relative: 0 %
Eosinophils Absolute: 0.1 10*3/uL (ref 0.0–0.5)
Eosinophils Relative: 1 %
Immature Granulocytes: 1 %
Lymphocytes Relative: 7 %
Lymphs Abs: 1.5 10*3/uL (ref 0.7–4.0)
Monocytes Absolute: 0.9 10*3/uL (ref 0.1–1.0)
Monocytes Relative: 5 %
Neutro Abs: 16.9 10*3/uL — ABNORMAL HIGH (ref 1.7–7.7)
Neutrophils Relative %: 86 %

## 2021-08-17 LAB — I-STAT CHEM 8, ED
BUN: 19 mg/dL (ref 8–23)
Calcium, Ion: 1.12 mmol/L — ABNORMAL LOW (ref 1.15–1.40)
Chloride: 101 mmol/L (ref 98–111)
Creatinine, Ser: 1.1 mg/dL — ABNORMAL HIGH (ref 0.44–1.00)
Glucose, Bld: 139 mg/dL — ABNORMAL HIGH (ref 70–99)
HCT: 46 % (ref 36.0–46.0)
Hemoglobin: 15.6 g/dL — ABNORMAL HIGH (ref 12.0–15.0)
Potassium: 3.8 mmol/L (ref 3.5–5.1)
Sodium: 138 mmol/L (ref 135–145)
TCO2: 28 mmol/L (ref 22–32)

## 2021-08-17 LAB — CBC
HCT: 45.2 % (ref 36.0–46.0)
Hemoglobin: 14.3 g/dL (ref 12.0–15.0)
MCH: 30 pg (ref 26.0–34.0)
MCHC: 31.6 g/dL (ref 30.0–36.0)
MCV: 94.8 fL (ref 80.0–100.0)
Platelets: 208 10*3/uL (ref 150–400)
RBC: 4.77 MIL/uL (ref 3.87–5.11)
RDW: 14.1 % (ref 11.5–15.5)
WBC: 19.6 10*3/uL — ABNORMAL HIGH (ref 4.0–10.5)
nRBC: 0 % (ref 0.0–0.2)

## 2021-08-17 LAB — PROTIME-INR
INR: 2.9 — ABNORMAL HIGH (ref 0.8–1.2)
Prothrombin Time: 30.3 seconds — ABNORMAL HIGH (ref 11.4–15.2)

## 2021-08-17 LAB — LDL CHOLESTEROL, DIRECT: Direct LDL: 64 mg/dL (ref 0–99)

## 2021-08-17 LAB — HEMOGLOBIN A1C
Hgb A1c MFr Bld: 5.7 % — ABNORMAL HIGH (ref 4.8–5.6)
Mean Plasma Glucose: 116.89 mg/dL

## 2021-08-17 LAB — CBG MONITORING, ED: Glucose-Capillary: 139 mg/dL — ABNORMAL HIGH (ref 70–99)

## 2021-08-17 LAB — APTT: aPTT: 40 seconds — ABNORMAL HIGH (ref 24–36)

## 2021-08-17 LAB — ETHANOL: Alcohol, Ethyl (B): <10 mg/dL (ref <10)

## 2021-08-17 MED ORDER — IOHEXOL 350 MG/ML SOLN
100.0000 mL | Freq: Once | INTRAVENOUS | Status: AC | PRN
  Administered 2021-08-17: 100 mL via INTRAVENOUS

## 2021-08-17 MED ORDER — ASPIRIN 81 MG PO CHEW
81.0000 mg | CHEWABLE_TABLET | Freq: Every day | ORAL | Status: DC
  Administered 2021-08-19 – 2021-08-24 (×5): 81 mg via ORAL
  Filled 2021-08-17 (×6): qty 1

## 2021-08-17 MED ORDER — ACETAMINOPHEN 325 MG PO TABS
650.0000 mg | ORAL_TABLET | Freq: Four times a day (QID) | ORAL | Status: DC | PRN
  Administered 2021-08-20 – 2021-08-21 (×2): 650 mg via ORAL
  Filled 2021-08-17 (×2): qty 2

## 2021-08-17 MED ORDER — SODIUM CHLORIDE 0.9 % IV BOLUS
500.0000 mL | Freq: Once | INTRAVENOUS | Status: AC
  Administered 2021-08-17: 500 mL via INTRAVENOUS

## 2021-08-17 MED ORDER — MORPHINE SULFATE (PF) 2 MG/ML IV SOLN
2.0000 mg | Freq: Once | INTRAVENOUS | Status: AC
  Administered 2021-08-17: 2 mg via INTRAVENOUS

## 2021-08-17 MED ORDER — ACETAMINOPHEN 650 MG RE SUPP: 650.0000 mg | Freq: Four times a day (QID) | RECTAL | Status: DC | PRN

## 2021-08-17 MED ORDER — CLOPIDOGREL BISULFATE 75 MG PO TABS: 75.0000 mg | ORAL_TABLET | Freq: Every day | ORAL | Status: DC

## 2021-08-17 NOTE — Code Documentation (Signed)
Stroke Response Nurse Documentation ?Code Documentation ? ?[name] Small is a 88 y.o. female arriving to [facility]  via [facility] on 08/17/21 with past medical hx of afib questionably on xarelto, HLD, HTN, permanent pacemaker. Code stroke was activated by EMS.  ? ?Patient from home where she was LKW at 1500. Son returned home about 1530 and found patient on the floor complaining of right hip/leg pain and speech difficulty.  Another son arrived attempting to help pull patient to couch. Tylenol was given to patient while sons observed to see if she improved. EMS was later called after she did not improve.  ? ?Stroke team at the bedside on patient arrival. Labs drawn and patient cleared for CT by Dr. Pearline Cables. Patient to CT with team. NIHSS 8, see documentation for details and code stroke times. Patient with disoriented, bilateral leg weakness, and left decreased sensation on exam.  ? ?The following imaging was completed:  CT Head, CTA, and CTP. Patient is not a candidate for IV Thrombolytic due to anticoagulation. Patient is not not a candidate for IR due to no LVO.  ? ?Care Plan: q2h mNIHSS and vitals.  ? ?Bedside handoff with ED RN Crystal.   ? ?[name]  ?Stroke Response RN ? ? ?

## 2021-08-17 NOTE — ED Triage Notes (Signed)
Pt bib ems as a code stroke from home. Son saw pt at 1530 and found pt lying on her R side. Possible anticoagulation. Pt with some expressive aphasia. R hip deformity with some shortening and rotation.  ?180/84 ?HR 80 ?93% RA ?

## 2021-08-17 NOTE — Consult Note (Signed)
NEUROLOGY CONSULTATION NOTE  ? ?Date of service: August 17, 2021 ?Patient Name: [name] Small ?MRN:  [id number] ?DOB:  22-Jun-1928 ?Reason for consult: stroke code ?Requesting physician: Dr. Thamas Jaegers ?_ _ _   _ __   _ __ _ _  __ __   _ __   __ _ ? ?History of Present Illness  ? ?This is a 88 year old woman with a past medical history significant for atrial fibrillation on Xarelto, hyperlipidemia, hypertension, sick sinus syndrome status post pacemaker who was brought in by EMS after a fall. Stroke code was activated in route due to aphasia, dysarthria, and questionable right lower extremity weakness.  Additional history was obtained from son [name] Small by phone.  He states that around 3 PM he left the house to go get a sandwich and returned around 330.  At that time he saw his mother laying on the floor on her right side.  She was complaining of severe pain in her right hip.  He and his brother tried to get her up and they were unable to therefore he called a friend.  When the friend arrived to the house with 3 men were able to pull her up onto the couch.  She was still complaining of right hip pain.  They administered Tylenol and waited about an hour but she was still having hip pain therefore they called 911.  On examination she had mild aphasia, mild dysarthria, drift to bed in her left lower extremity.  She was able to wiggle toes on her right leg but further examination was limited due to severe pain in her right hip.  NIH stroke scale was 9.  TNK was not administered due to patient and son both reporting that she was on therapeutic anticoagulation.  CT perfusion was performed which showed decreased perfusion in the left MCA region however she did not have an LVO on CTA.  CNS imaging personally reviewed. Per EDP x-rays concerning for right hip fracture.  Ortho was consulted who said they will see the patient when she is cleared by the stroke team. ?  ?ROS  ? ?Per HPI: all other systems reviewed and are negative ? ?Past  History  ? ?I have reviewed the following: ? ?Past Medical History:  ?Diagnosis Date  ? Atrial fibrillation ([place])   ? Bleeding stomach ulcer 1980s?  ? Cancer ([place]) Meloanoma left leg  ? DIVERTICULOSIS, COLON 11/22/2006  ? Headache(784.0)   ? "very often; not regular" (11/25/2015)  ? History of blood transfusion 1980s  ? "when I had the bleeding ulcer"  ? HYPERLIPIDEMIA 11/22/2006  ? Hypertension   ? Menopausal syndrome (hot flashes)   ? OSTEOPOROSIS 11/22/2006  ? Presence of permanent cardiac pacemaker   ? ?Past Surgical History:  ?Procedure Laterality Date  ? APPENDECTOMY    ? BUNIONECTOMY Right   ? CATARACT EXTRACTION W/ INTRAOCULAR LENS  IMPLANT, BILATERAL Bilateral 2000s  ? CHOLECYSTECTOMY OPEN    ? EP IMPLANTABLE DEVICE N/A 11/25/2015  ? Procedure: Pacemaker Implant;  Surgeon: Will Meredith Leeds, MD;  Location: [facility];  Service: Cardiovascular;  Laterality: N/A;  ? FEMUR IM NAIL Left 07/08/2014  ? Procedure: INTRAMEDULLARY (IM) NAIL ;  Surgeon: Meredith Pel, MD;  Location: [facility];  Service: Orthopedics;  Laterality: Left;  ? FRACTURE SURGERY    ? IR KYPHO LUMBAR INC FX REDUCE BONE BX UNI/BIL CANNULATION INC/IMAGING  03/21/2021  ? REVISION TOTAL HIP ARTHROPLASTY Left 06/2014  ? ?Family History  ?Problem Relation  Age of Onset  ? Alzheimer's disease Sister   ? Cardiomyopathy Brother   ? ?Social History  ? ?Socioeconomic History  ? Marital status: Widowed  ?  Spouse name: Not on file  ? Number of children: 2  ? Years of education: 60  ? Highest education level: Not on file  ?Occupational History  ?  Comment: retired  ?Tobacco Use  ? Smoking status: Never  ?  Passive exposure: Never  ? Smokeless tobacco: Never  ?Vaping Use  ? Vaping Use: Never used  ?Substance and Sexual Activity  ? Alcohol use: No  ?  Alcohol/week: 0.0 standard drinks  ? Drug use: No  ? Sexual activity: Never  ?Other Topics Concern  ? Not on file  ?Social History Narrative  ? Lives alone, son lives beside her  ? Caffeine - none  ? ?Social  Determinants of Health  ? ?Financial Resource Strain: Not on file  ?Food Insecurity: Not on file  ?Transportation Needs: Not on file  ?Physical Activity: Not on file  ?Stress: Not on file  ?Social Connections: Not on file  ? ?Allergies  ?Allergen Reactions  ? Septra Ds [Sulfamethoxazole-Trimethoprim]   ? Lisinopril Other (See Comments)  ?  Cough  ? ? ?Medications  ? ?(Not in a hospital admission) ?  ? ? ?Current Facility-Administered Medications:  ?  acetaminophen (TYLENOL) tablet 650 mg, 650 mg, Oral, Q6H PRN **OR** acetaminophen (TYLENOL) suppository 650 mg, 650 mg, Rectal, Q6H PRN, Shela Leff, MD ? ?Current Outpatient Medications:  ?  acetaminophen (TYLENOL) 500 MG tablet, Take 1 tablet (500 mg total) by mouth every 6 (six) hours as needed., Disp: 30 tablet, Rfl: 0 ?  amiodarone (PACERONE) 200 MG tablet, TAKE 1 TABLET(200 MG) BY MOUTH DAILY (Patient taking differently: Take 200 mg by mouth daily with lunch.), Disp: 90 tablet, Rfl: 2 ?  atorvastatin (LIPITOR) 40 MG tablet, TAKE 1 TABLET(40 MG) BY MOUTH DAILY (Patient taking differently: Take 40 mg by mouth daily with lunch.), Disp: 90 tablet, Rfl: 0 ?  docusate sodium (COLACE) 100 MG capsule, Take 1 capsule (100 mg total) by mouth 2 (two) times daily., Disp: 10 capsule, Rfl: 0 ?  metoprolol tartrate (LOPRESSOR) 25 MG tablet, Take 1.5 tablets (37.5 mg total) by mouth 2 (two) times daily., Disp: 270 tablet, Rfl: 3 ?  nitrofurantoin, macrocrystal-monohydrate, (MACROBID) 100 MG capsule, Take 1 capsule (100 mg total) by mouth at bedtime., Disp: 30 capsule, Rfl: 0 ?  ondansetron (ZOFRAN) 4 MG tablet, Take 1 tablet (4 mg total) by mouth daily as needed for nausea or vomiting., Disp: 30 tablet, Rfl: 1 ?  oxyCODONE (ROXICODONE) 5 MG immediate release tablet, Take 1 tablet (5 mg total) by mouth every 8 (eight) hours as needed for up to 10 doses for severe pain., Disp: 10 tablet, Rfl: 0 ?  pantoprazole (PROTONIX) 40 MG tablet, Take 1 tablet (40 mg total) by mouth  daily., Disp: 30 tablet, Rfl: 0 ?  senna-docusate (SENOKOT-S) 8.6-50 MG tablet, Take 1 tablet by mouth daily as needed for up to 15 doses for mild constipation. To prevent constipation when taking oxycodone, Disp: 15 tablet, Rfl: 0 ?  XARELTO 15 MG TABS tablet, TAKE 1 TABLET(15 MG) BY MOUTH DAILY WITH DINNER (Patient taking differently: Take 15 mg by mouth daily with lunch.), Disp: 30 tablet, Rfl: 5 ? ?Vitals  ? ?Vitals:  ? 08/17/21 1845 08/17/21 1900 08/17/21 2000 08/17/21 2055  ?BP: (!) 153/76 (!) 156/81 (!) 167/83 (!) 154/77  ?Pulse: 72  [name])  59 60  ?Resp: '16  14 12  '$ ?Temp: 98 ?F (36.7 ?C)     ?SpO2: 93%  100% 100%  ?Weight:      ?  ? ?Body mass index is 17.83 kg/m?. ? ?Physical Exam  ? ?Physical Exam ?Gen: alert and oriented to self, hospital, and age, but not month. Follows simple commands. ?Resp: CTAB, no w/r/r ?CV: RRR, no m/g/r; nml S1 and S2. 2+ symmetric peripheral pulses. ? ?Neuro: ?*MS: alert and oriented to self, hospital, and age, but not month. Follows simple commands. ?*Speech: mild dysarthria, naming is mildly impaired ?*CN:  ?  I: Deferred ?  II,III: PERRLA, VFF by confrontation, optic discs unable to be visualized 2/2 pupillary constriction ?  III,IV,VI: EOMI w/o nystagmus, no ptosis ?  V: Sensation intact from V1 to V3 to LT ?  VII: Eyelid closure was full.  Smile symmetric. ?  VIII: Hearing intact to voice ?  IX,X: Voice normal, palate elevates symmetrically  ?  XI: SCM/trap 5/5 bilat   ?XII: Tongue protrudes midline, no atrophy or fasciculations  ?*Motor:   Normal bulk.  No tremor, rigidity or bradykinesia. BUE no drift. LLE drift to bed. Able to wiggle toes on RLE but further evaluation limited 2/2 severe pain R hip. ?*Sensory: R side sensory impairment + severe pain R hip ?*Coordination:  FNF intact bilat ?*Reflexes:  2+ and symmetric throughout without clonus; toes down-going bilat ?*Gait: deferred ? ?NIHSS ? ?1a Level of Conscious.: 0 ?1b LOC Questions: 1 ?1c LOC Commands: 0 ?2 Best Gaze:  0 ?3 Visual: 0 ?4 Facial Palsy: 0 ?5a Motor Arm - left: 0 ?5b Motor Arm - Right: 0 ?6a Motor Leg - Left: 2 ?6b Motor Leg - Right: 3 ?7 Limb Ataxia: 0 ?8 Sensory: 1 ?9 Best Language: 1 ?10 Dysarthria: 1 ?11 Ex

## 2021-08-17 NOTE — H&P (Signed)
?History and Physical  ? ? ?[name] Small WOE:[id number] DOB: [birth year] DOA: 08/17/2021 ? ?PCP: Billie Ruddy, MD ? ?Patient coming from: Home ? ?Chief Complaint: Code stroke ? ?HPI: [name] Small is a 88 y.o. female with medical history significant of chronic A-fib on Xarelto, sick sinus syndrome status post pacemaker, CKD stage IIIa, hypertension, hyperlipidemia, GERD, vertebral compression fracture status post vertebroplasty 02/2021.  Recently admitted 08/10/2021-08/12/2021 for acute metabolic encephalopathy secondary to UTI.  She was treated with vancomycin and Rocephin.  Urine cultures with no significant growth noted.  She was transitioned to nitrofurantoin based on prior cultures with plan to continue until outpatient urology follow-up due to recurrent UTIs. ? ?Patient presented to the ED today via EMS as code stroke for evaluation of slurred speech, facial droop, and left-sided weakness.  Last known well at 1500 and found down on the floor by family at 71 and complained of right hip pain.  Code stroke activated and seen by neurology.  Not a tPA candidate due to being on chronic anticoagulation with Xarelto. ?   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 ? ?CT angio head and neck, CT perfusion study: ?"IMPRESSION: ?1. Decreased perfusion (Tmax> 6 seconds) in the posterior left MCA ?territory, which correlates with an area with decreased contrast ?enhancement in the MCA branches. While there is mild to moderate ?narrowing in the distal left M1 segment, no additional focal ?stenosis or occlusion is seen in the left MCA branches. No infarct ?core. ?2. Multifocal narrowing in the bilateral PCAs. No other ?hemodynamically significant intracranial stenosis. ?3. Approximately 50% proximal left ICA stenosis, unchanged. No ?additional hemodynamically significant stenosis in the neck." ? ?MRI of brain pending.   ? ?Labs showing WBC 19.6.  PT 30.3, INR 2.9.  BUN 15, creatinine 1.2 (baseline  creatinine 0.8-1.0).  UA and UDS pending.  Blood ethanol level undetectable. ? ?X-ray of right hip/pelvis showing acute displaced right femoral neck fracture. Orthopedics (Dr. Marcelino Scot) consulted for hip fracture.   ? ?Chest x-ray showing no acute cardiopulmonary process. ? ?Patient was given morphine and 500 cc normal saline bolus. ? ?Patient is confused.  States she was walking using her walker and fell.  She is not sure how she fell.  Reports pain in her right hip.  Denies chest pain or shortness of breath.  Reporting dysuria.  Son states he was with the patient and she appeared normal until 3 PM.  He then went out to get food and when he returned at 3:30 PM, she was on the floor, appeared confused with slurred speech, complaining of right leg pain.  She was not able to get up from the floor.  Son states patient was hospitalized a week ago for confusion and told that she had a UTI.  She has not been seen by urology yet. ? ?Review of Systems:  ?Review of Systems  ?All other systems reviewed and are negative. ? ?Past Medical History:  ?Diagnosis Date  ? Atrial fibrillation ([place])   ? Bleeding stomach ulcer 1980s?  ? Cancer ([place]) Meloanoma left leg  ? DIVERTICULOSIS, COLON 11/22/2006  ? Headache(784.0)   ? "very often; not regular" (11/25/2015)  ? History of blood transfusion 1980s  ? "when I had the bleeding ulcer"  ? HYPERLIPIDEMIA 11/22/2006  ? Hypertension   ? Menopausal syndrome (hot flashes)   ? OSTEOPOROSIS 11/22/2006  ? Presence of permanent cardiac pacemaker   ? ? ?Past Surgical History:  ?Procedure Laterality Date  ?  APPENDECTOMY    ? BUNIONECTOMY Right   ? CATARACT EXTRACTION W/ INTRAOCULAR LENS  IMPLANT, BILATERAL Bilateral 2000s  ? CHOLECYSTECTOMY OPEN    ? EP IMPLANTABLE DEVICE N/A 11/25/2015  ? Procedure: Pacemaker Implant;  Surgeon: Will Meredith Leeds, MD;  Location: [facility];  Service: Cardiovascular;  Laterality: N/A;  ? FEMUR IM NAIL Left 07/08/2014  ? Procedure: INTRAMEDULLARY (IM) NAIL ;  Surgeon:  Meredith Pel, MD;  Location: [facility];  Service: Orthopedics;  Laterality: Left;  ? FRACTURE SURGERY    ? IR KYPHO LUMBAR INC FX REDUCE BONE BX UNI/BIL CANNULATION INC/IMAGING  03/21/2021  ? REVISION TOTAL HIP ARTHROPLASTY Left 06/2014  ? ? ? reports that she has never smoked. She has never been exposed to tobacco smoke. She has never used smokeless tobacco. She reports that she does not drink alcohol and does not use drugs. ? ?Allergies  ?Allergen Reactions  ? Septra Ds [Sulfamethoxazole-Trimethoprim]   ? Lisinopril Other (See Comments)  ?  Cough  ? ? ?Family History  ?Problem Relation Age of Onset  ? Alzheimer's disease Sister   ? Cardiomyopathy Brother   ? ? ?Prior to Admission medications   ?Medication Sig Start Date End Date Taking? Authorizing Provider  ?acetaminophen (TYLENOL) 500 MG tablet Take 1 tablet (500 mg total) by mouth every 6 (six) hours as needed. 03/07/21   Varney Biles, MD  ?amiodarone (PACERONE) 200 MG tablet TAKE 1 TABLET(200 MG) BY MOUTH DAILY ?Patient taking differently: Take 200 mg by mouth daily with lunch. 02/07/21   Josue Hector, MD  ?atorvastatin (LIPITOR) 40 MG tablet TAKE 1 TABLET(40 MG) BY MOUTH DAILY ?Patient taking differently: Take 40 mg by mouth daily with lunch. 02/11/21   Billie Ruddy, MD  ?docusate sodium (COLACE) 100 MG capsule Take 1 capsule (100 mg total) by mouth 2 (two) times daily. 03/22/21   Barb Merino, MD  ?metoprolol tartrate (LOPRESSOR) 25 MG tablet Take 1.5 tablets (37.5 mg total) by mouth 2 (two) times daily. 06/01/21   Billie Ruddy, MD  ?nitrofurantoin, macrocrystal-monohydrate, (MACROBID) 100 MG capsule Take 1 capsule (100 mg total) by mouth at bedtime. 08/12/21 09/11/21  Manuella Ghazi, Pratik D, DO  ?ondansetron (ZOFRAN) 4 MG tablet Take 1 tablet (4 mg total) by mouth daily as needed for nausea or vomiting. 04/11/21 04/11/22  Antonieta Pert, MD  ?oxyCODONE (ROXICODONE) 5 MG immediate release tablet Take 1 tablet (5 mg total) by mouth every 8 (eight) hours as  needed for up to 10 doses for severe pain. 08/02/21   Wyvonnia Dusky, MD  ?pantoprazole (PROTONIX) 40 MG tablet Take 1 tablet (40 mg total) by mouth daily. 04/11/21 08/10/21  Antonieta Pert, MD  ?senna-docusate (SENOKOT-S) 8.6-50 MG tablet Take 1 tablet by mouth daily as needed for up to 15 doses for mild constipation. To prevent constipation when taking oxycodone 08/02/21   Wyvonnia Dusky, MD  ?XARELTO 15 MG TABS tablet TAKE 1 TABLET(15 MG) BY MOUTH DAILY WITH DINNER ?Patient taking differently: Take 15 mg by mouth daily with lunch. 09/27/20   Josue Hector, MD  ? ? ?Physical Exam: ?Vitals:  ? 08/17/21 2000 08/17/21 2055 08/17/21 2345 08/18/21 0046  ?BP: (!) 167/83 (!) 154/77 (!) 182/81 (!) 170/72  ?Pulse: (!) 59 60 (!) 59 (!) 59  ?Resp: _0 ?Temp:    98.1 ?F (36.7 ?C)  ?SpO2: 100% 100% 100% 97%  ?Weight:      ? ? ?Physical Exam ?Constitutional:   ?  General: She is not in acute distress. ?HENT:  ?   Head: Normocephalic and atraumatic.  ?Eyes:  ?   Extraocular Movements: Extraocular movements intact.  ?Cardiovascular:  ?   Rate and Rhythm: Normal rate and regular rhythm.  ?   Pulses: Normal pulses.  ?Pulmonary:  ?   Effort: Pulmonary effort is normal. No respiratory distress.  ?   Breath sounds: Normal breath sounds. No wheezing or rales.  ?Abdominal:  ?   General: Bowel sounds are normal. There is no distension.  ?   Palpations: Abdomen is soft.  ?   Tenderness: There is no abdominal tenderness.  ?Musculoskeletal:     ?   General: No swelling or tenderness.  ?   Cervical back: Normal range of motion and neck supple.  ?   Comments: Right lower extremity shortened and externally rotated  ?Skin: ?   General: Skin is warm and dry.  ?Neurological:  ?   Mental Status: She is alert.  ?   Comments: Awake and alert ?Oriented to person and place ?Speech fluent and no obvious facial droop ?Mild left-sided weakness  ?  ? ?Labs on Admission: I have personally reviewed following labs and imaging studies ? ?CBC: ?Recent  Labs  ?Lab 08/11/21 ?2119 08/17/21 ?1842 08/17/21 ?1844  ?WBC 8.0 19.6*  --   ?NEUTROABS  --  16.9*  --   ?HGB 11.9* 14.3 15.6*  ?HCT 38.8 45.2 46.0  ?MCV 94.4 94.8  --   ?PLT 161 208  --   ? ?Basic Met

## 2021-08-17 NOTE — ED Notes (Signed)
[place] MD notified regarding pts BP 182/81 ?

## 2021-08-17 NOTE — ED Notes (Signed)
Patient transported to X-ray 

## 2021-08-17 NOTE — ED Provider Notes (Signed)
?[facility] ?Provider Note ? ? ?CSN: [id number] ?Arrival date & time: 08/17/21  1835 ? ?An emergency department physician performed an initial assessment on this suspected stroke patient at 1837. ? ?History ? ?Chief Complaint  ?Patient presents with  ? Code Stroke  ? ? ?[name] Small is a 88 y.o. female. ? ?Patient presents chief complaint of ultimately status, slurred speech and aphasia.  Son had last seen her well at 3 PM.  When he checked on her again at 93 she was found on the ground and appeared confused and aphasic.  Complaining of right lower extremity pain as well.  Patient is on Xarelto for history of atrial fibrillation.  Was recently admitted a few weeks ago for episodes of confusion thought to be metabolic encephalopathy. ? ? ?  ? ?Home Medications ?Prior to Admission medications   ?Medication Sig Start Date End Date Taking? Authorizing Provider  ?acetaminophen (TYLENOL) 500 MG tablet Take 1 tablet (500 mg total) by mouth every 6 (six) hours as needed. 03/07/21   Varney Biles, MD  ?amiodarone (PACERONE) 200 MG tablet TAKE 1 TABLET(200 MG) BY MOUTH DAILY ?Patient taking differently: Take 200 mg by mouth daily with lunch. 02/07/21   Josue Hector, MD  ?atorvastatin (LIPITOR) 40 MG tablet TAKE 1 TABLET(40 MG) BY MOUTH DAILY ?Patient taking differently: Take 40 mg by mouth daily with lunch. 02/11/21   Billie Ruddy, MD  ?docusate sodium (COLACE) 100 MG capsule Take 1 capsule (100 mg total) by mouth 2 (two) times daily. 03/22/21   Barb Merino, MD  ?metoprolol tartrate (LOPRESSOR) 25 MG tablet Take 1.5 tablets (37.5 mg total) by mouth 2 (two) times daily. 06/01/21   Billie Ruddy, MD  ?nitrofurantoin, macrocrystal-monohydrate, (MACROBID) 100 MG capsule Take 1 capsule (100 mg total) by mouth at bedtime. 08/12/21 09/11/21  Manuella Ghazi, Pratik D, DO  ?ondansetron (ZOFRAN) 4 MG tablet Take 1 tablet (4 mg total) by mouth daily as needed for nausea or vomiting. 04/11/21 04/11/22   Antonieta Pert, MD  ?oxyCODONE (ROXICODONE) 5 MG immediate release tablet Take 1 tablet (5 mg total) by mouth every 8 (eight) hours as needed for up to 10 doses for severe pain. 08/02/21   Wyvonnia Dusky, MD  ?pantoprazole (PROTONIX) 40 MG tablet Take 1 tablet (40 mg total) by mouth daily. 04/11/21 08/10/21  Antonieta Pert, MD  ?senna-docusate (SENOKOT-S) 8.6-50 MG tablet Take 1 tablet by mouth daily as needed for up to 15 doses for mild constipation. To prevent constipation when taking oxycodone 08/02/21   Wyvonnia Dusky, MD  ?XARELTO 15 MG TABS tablet TAKE 1 TABLET(15 MG) BY MOUTH DAILY WITH DINNER ?Patient taking differently: Take 15 mg by mouth daily with lunch. 09/27/20   Josue Hector, MD  ?   ? ?Allergies    ?Septra ds [sulfamethoxazole-trimethoprim] and Lisinopril   ? ?Review of Systems   ?Review of Systems  ?Unable to perform ROS: Acuity of condition  ? ?Physical Exam ?Updated Vital Signs ?BP (!) 167/83   Pulse (!) 59   Temp 98 ?F (36.7 ?C)   Resp 14   Wt 48.6 kg   SpO2 100%   BMI 17.83 kg/m?  ?Physical Exam ?Constitutional:   ?   General: She is not in acute distress. ?   Appearance: Normal appearance.  ?HENT:  ?   Head: Normocephalic.  ?   Nose: Nose normal.  ?Eyes:  ?   Extraocular Movements: Extraocular movements intact.  ?Cardiovascular:  ?  Rate and Rhythm: Normal rate.  ?Pulmonary:  ?   Effort: Pulmonary effort is normal.  ?Musculoskeletal:     ?   General: Normal range of motion.  ?   Cervical back: Normal range of motion.  ?Neurological:  ?   Mental Status: She is alert.  ?   Comments: Patient awake alert.  Appears confused.  ? ? ?ED Results / Procedures / Treatments   ?Labs ?(all labs ordered are listed, but only abnormal results are displayed) ?Labs Reviewed  ?PROTIME-INR - Abnormal; Notable for the following components:  ?    Result Value  ? Prothrombin Time 30.3 (*)   ? INR 2.9 (*)   ? All other components within normal limits  ?APTT - Abnormal; Notable for the following components:  ? aPTT  40 (*)   ? All other components within normal limits  ?CBC - Abnormal; Notable for the following components:  ? WBC 19.6 (*)   ? All other components within normal limits  ?DIFFERENTIAL - Abnormal; Notable for the following components:  ? Neutro Abs 16.9 (*)   ? Abs Immature Granulocytes 0.19 (*)   ? All other components within normal limits  ?COMPREHENSIVE METABOLIC PANEL - Abnormal; Notable for the following components:  ? Glucose, Bld 144 (*)   ? Creatinine, Ser 1.22 (*)   ? GFR, Estimated 42 (*)   ? All other components within normal limits  ?I-STAT CHEM 8, ED - Abnormal; Notable for the following components:  ? Creatinine, Ser 1.10 (*)   ? Glucose, Bld 139 (*)   ? Calcium, Ion 1.12 (*)   ? Hemoglobin 15.6 (*)   ? All other components within normal limits  ?CBG MONITORING, ED - Abnormal; Notable for the following components:  ? Glucose-Capillary 139 (*)   ? All other components within normal limits  ?RESP PANEL BY RT-PCR (FLU A&B, COVID) ARPGX2  ?ETHANOL  ?RAPID URINE DRUG SCREEN, HOSP PERFORMED  ?URINALYSIS, ROUTINE W REFLEX MICROSCOPIC  ? ? ?EKG ?EKG Interpretation ? ?Date/Time:  Wednesday August 17 2021 19:18:22 EDT ?Ventricular Rate:  60 ?PR Interval:  196 ?QRS Duration: 110 ?QT Interval:  477 ?QTC Calculation: 477 ?R Axis:   -69 ?Text Interpretation: Sinus rhythm Incomplete RBBB and LAFB Low voltage, precordial leads Confirmed by Thamas Jaegers ([id number]) on 08/17/2021 7:50:33 PM ? ?Radiology ?DG Chest 1 View ? ?Result Date: 08/17/2021 ?CLINICAL DATA:  Code stroke. EXAM: CHEST  1 VIEW COMPARISON:  Chest radiograph dated 04/06/2021. FINDINGS: No focal consolidation, pleural effusion, pneumothorax. There is diffuse chronic interstitial coarsening and bronchitic changes. Faint densities in the lower lung fields, likely atelectasis. The cardiac silhouette is within normal limits. Atherosclerotic calcification of the aorta. Left pectoral pacemaker device. Degenerative changes of the spine. No acute osseous pathology.  IMPRESSION: No acute cardiopulmonary process. Electronically Signed   By: Anner Crete M.D.   On: 08/17/2021 20:30  ? ?DG Hip Unilat W or Wo Pelvis 2-3 Views Right ? ?Result Date: 08/17/2021 ?CLINICAL DATA:  [place].  Right hip pain. EXAM: DG HIP (WITH OR WITHOUT PELVIS) 2-3V RIGHT COMPARISON:  CT scan 08/02/2021 FINDINGS: Acute displaced right femoral neck fracture. Stable left hip hardware. The pubic symphysis and SI joints are intact. The bony pelvis is grossly intact. IMPRESSION: Acute displaced right femoral neck fracture. Electronically Signed   By: Marijo Sanes M.D.   On: 08/17/2021 20:31  ? ?CT HEAD CODE STROKE WO CONTRAST ? ?Result Date: 08/17/2021 ?CLINICAL DATA:  Code stroke. Neuro deficit, acute, stroke suspected.  Found on the ground with speech disturbance. EXAM: CT HEAD WITHOUT CONTRAST TECHNIQUE: Contiguous axial images were obtained from the base of the skull through the vertex without intravenous contrast. RADIATION DOSE REDUCTION: This exam was performed according to the departmental dose-optimization program which includes automated exposure control, adjustment of the mA and/or kV according to patient size and/or use of iterative reconstruction technique. COMPARISON:  08/10/2021 FINDINGS: Brain: No acute CT finding. Generalized atrophy. Chronic small-vessel ischemic changes of the white matter. Old left parietal cortical and subcortical infarction. No mass, hemorrhage, hydrocephalus or extra-axial collection. Vascular: There is atherosclerotic calcification of the major vessels at the base of the brain. Skull: Negative Sinuses/Orbits: Clear except for a few scattered retention cysts. Other: None ASPECTS ([place] Stroke Program Early CT Score) - Ganglionic level infarction (caudate, lentiform nuclei, internal capsule, insula, M1-M3 cortex): 7 - Supraganglionic infarction (M4-M6 cortex): 3, allowing for the old infarction. Total score (0-10 with 10 being normal): 10, allowing for the old left  parietal infarction. IMPRESSION: 1. No acute CT finding. Atrophy and chronic small-vessel ischemic changes. 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 2. ASPECTS is 10, allowing for the old infarction. 3. These results

## 2021-08-18 ENCOUNTER — Inpatient Hospital Stay (HOSPITAL_COMMUNITY): Payer: Medicare Other

## 2021-08-18 ENCOUNTER — Other Ambulatory Visit: Payer: Self-pay

## 2021-08-18 ENCOUNTER — Telehealth: Payer: Self-pay | Admitting: Family Medicine

## 2021-08-18 DIAGNOSIS — R531 Weakness: Secondary | ICD-10-CM

## 2021-08-18 DIAGNOSIS — S72009A Fracture of unspecified part of neck of unspecified femur, initial encounter for closed fracture: Secondary | ICD-10-CM

## 2021-08-18 DIAGNOSIS — I6389 Other cerebral infarction: Secondary | ICD-10-CM | POA: Diagnosis not present

## 2021-08-18 DIAGNOSIS — I639 Cerebral infarction, unspecified: Secondary | ICD-10-CM | POA: Diagnosis not present

## 2021-08-18 DIAGNOSIS — I495 Sick sinus syndrome: Secondary | ICD-10-CM

## 2021-08-18 DIAGNOSIS — D72829 Elevated white blood cell count, unspecified: Secondary | ICD-10-CM

## 2021-08-18 LAB — CBC
HCT: 38.7 % (ref 36.0–46.0)
Hemoglobin: 12.9 g/dL (ref 12.0–15.0)
MCH: 30.6 pg (ref 26.0–34.0)
MCHC: 33.3 g/dL (ref 30.0–36.0)
MCV: 91.7 fL (ref 80.0–100.0)
Platelets: 143 10*3/uL — ABNORMAL LOW (ref 150–400)
RBC: 4.22 MIL/uL (ref 3.87–5.11)
RDW: 14.2 % (ref 11.5–15.5)
WBC: 23.5 10*3/uL — ABNORMAL HIGH (ref 4.0–10.5)
nRBC: 0 % (ref 0.0–0.2)

## 2021-08-18 LAB — BASIC METABOLIC PANEL
Anion gap: 9 (ref 5–15)
BUN: 13 mg/dL (ref 8–23)
CO2: 24 mmol/L (ref 22–32)
Calcium: 8.6 mg/dL — ABNORMAL LOW (ref 8.9–10.3)
Chloride: 107 mmol/L (ref 98–111)
Creatinine, Ser: 0.95 mg/dL (ref 0.44–1.00)
GFR, Estimated: 57 mL/min — ABNORMAL LOW (ref >60)
Glucose, Bld: 148 mg/dL — ABNORMAL HIGH (ref 70–99)
Potassium: 3.9 mmol/L (ref 3.5–5.1)
Sodium: 140 mmol/L (ref 135–145)

## 2021-08-18 LAB — VITAMIN B12: Vitamin B-12: 232 pg/mL (ref 180–914)

## 2021-08-18 LAB — ECHOCARDIOGRAM COMPLETE BUBBLE STUDY
Area-P 1/2: 3.31 cm2
S' Lateral: 1.5 cm

## 2021-08-18 LAB — TSH: TSH: 1.864 u[IU]/mL (ref 0.350–4.500)

## 2021-08-18 MED ORDER — LACTATED RINGERS IV SOLN: INTRAVENOUS | Status: AC

## 2021-08-18 MED ORDER — SODIUM CHLORIDE 0.9 % IV SOLN
1.0000 g | INTRAVENOUS | Status: AC
  Administered 2021-08-18 – 2021-08-22 (×5): 1 g via INTRAVENOUS
  Filled 2021-08-18 (×5): qty 10

## 2021-08-18 MED ORDER — ATORVASTATIN CALCIUM 40 MG PO TABS
40.0000 mg | ORAL_TABLET | Freq: Every day | ORAL | Status: DC
  Administered 2021-08-19 – 2021-08-24 (×6): 40 mg via ORAL
  Filled 2021-08-18 (×6): qty 1

## 2021-08-18 MED ORDER — HEPARIN (PORCINE) 25000 UT/250ML-% IV SOLN
600.0000 [IU]/h | INTRAVENOUS | Status: DC
  Administered 2021-08-18: 600 [IU]/h via INTRAVENOUS
  Filled 2021-08-18: qty 250

## 2021-08-18 MED ORDER — MORPHINE SULFATE (PF) 2 MG/ML IV SOLN: 1.0000 mg | INTRAVENOUS | Status: DC | PRN

## 2021-08-18 MED ORDER — METHOCARBAMOL 500 MG PO TABS: 500.0000 mg | ORAL_TABLET | Freq: Four times a day (QID) | ORAL | Status: DC | PRN

## 2021-08-18 MED ORDER — OXYCODONE HCL 5 MG PO TABS
5.0000 mg | ORAL_TABLET | Freq: Four times a day (QID) | ORAL | Status: DC | PRN
  Administered 2021-08-18 – 2021-08-20 (×2): 5 mg via ORAL
  Filled 2021-08-18 (×2): qty 1

## 2021-08-18 MED ORDER — AMIODARONE HCL 200 MG PO TABS
200.0000 mg | ORAL_TABLET | Freq: Every day | ORAL | Status: DC
  Administered 2021-08-19 – 2021-08-24 (×6): 200 mg via ORAL
  Filled 2021-08-18 (×6): qty 1

## 2021-08-18 MED ORDER — ONDANSETRON HCL 4 MG/2ML IJ SOLN
4.0000 mg | Freq: Once | INTRAMUSCULAR | Status: AC
  Administered 2021-08-18: 4 mg via INTRAVENOUS

## 2021-08-18 NOTE — Hospital Course (Addendum)
[name] Small is a 88 year old female with past medical history significant for chronic atrial fibrillation on Xarelto, sick sinus syndrome s/p PPM, CKD stage IIIa, essential hypertension, HLD, GERD, vertebral compression fracture s/p vertebroplasty 02/2021 who presented to [facility] ED on 3/29 via EMS as a code stroke for evaluation of slurred speech, facial droop, left-sided weakness.  Last known normal at 1503/29 and was found down on the floor by family at 95.  Patient complained of right hip pain, unable to ambulate.  Patient was confused on ED presentation and stated that she was walking using her walker and fell; but is unsure how this occurred.  Reporting pain in her right hip.  Also reports dysuria.  Denies chest pain or shortness of breath.  Son states he was with the patient and she appeared normal total 1500; then he went out to get food and when he returned at 1530, she was on the floor and appeared confused with slurred speech, right leg pain and facial droop.  She was unable to get up from the floor. ? ?Patient was recently admitted 08/10/2021 through 08/12/2021 for acute metabolic encephalopathy secondary to UTI treated with vancomycin and Rocephin.  Patient was transition to nitrofurantoin based on prior cultures with plan outpatient urology follow-up due to recurrent UTIs. ? ?In the ED, temperature 98.0 ?F, HR 72, RR 16, BP 153/76, SPO2 93% on room air.  Sodium 137, potassium 3.8, chloride 103, CO2 25, glucose 144, BUN 15, creatinine 1.22, AST 37, ALT 35.  WBC 19.6, hemoglobin 14.3, platelets 208.  INR 2.9.  TSH 1.864.  EtOH level less than 10.  Chest x-ray with no acute cardiopulmonary disease process.  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  CTA head/neck with decreased perfusion posterior left MCA, moderate narrowing distal left M1 segment, multifocal narrowing bilateral PCAs, 50% proximal left ICA  stenosis, unchanged.  X-ray right hip/pelvis with acute displaced right femoral neck fracture.  Neurology and orthopedics were consulted.  Patient was not a tPA candidate due to chronic anticoagulation on Xarelto.  Hospital service consulted for further evaluation and management of hip fracture and concern for acute CVA. ? ?**Interim History ?She underwent a procedure for her right femoral neck fracture and underwent right hip hemiarthroplasty.  Orthopedic surgery recommended weightbearing as tolerated with assistance.  She had an acute blood loss anemia and received transfusions of PRBCs.  She was placed back on her Xarelto for anticoagulation and was deemed stable from a medical standpoint from a orthopedic and a neurology standpoint.  She will need to follow-up for continued rehabilitative efforts ?

## 2021-08-18 NOTE — Assessment & Plan Note (Addendum)
-  Patient with unwitnessed fall at home with x-ray notable for acute displaced right femoral neck fracture.   ?-Underwent right hemiarthroplasty on 08/20/2021 by Dr. Marcelino Scot.   ?-Vitamin D 25-hydroxy 12.78. ?-Orthopedics following, appreciate assistance ?-Home Xarelto for DVT PPX resumed ?-C/w Oxycodone 5 mg p.o. q6h PRN moderate pain; Further pain control per Ortho ?-Morphine 1 mg IV every 4 hours as needed severe pain ?-C/w Robaxin '500mg'$  PO q6h PRN muscle spasm ?-Started on vitamin D3 2000 units twice daily ?-WBAT with posterior hip precautions ?-PT/OT recommend SNF placement; per TOC ? ?

## 2021-08-18 NOTE — Assessment & Plan Note (Addendum)
-  Status post PPM ?-Continue to Monitor on telemetry while hospitalized ?

## 2021-08-18 NOTE — Progress Notes (Signed)
Informed of MRI for today.  ? ?Device system confirmed to be MRI conditional, with implant date > 6 weeks ago, and no evidence of abandoned or epicardial leads in review of most recent CXR ?Interrogation from today reviewed, pt is currently AP-VS at 60s bpm ?Change device settings for MRI to DOO at 80 bpm ? ?Tachy-therapies to off if applicable. ? ?Program device back to pre-MRI settings after completion of exam. ? ?Shirley Friar, PA-C  ?08/18/2021 1:46 PM   ?

## 2021-08-18 NOTE — Assessment & Plan Note (Addendum)
-  C/w Amiodarone 200 mg p.o. daily ?-C/w metoprolol tartrate 37.5 mg p.o. twice daily  ?-C/w Xarelto '15mg'$  PO daily ?-Monitor on telemetry while hospitalized  ?

## 2021-08-18 NOTE — Telephone Encounter (Signed)
Please let son know I am so sorry to hear this.  Will try to reach out to him. ?

## 2021-08-18 NOTE — Assessment & Plan Note (Addendum)
-  Patient is afebrile, but history of recurrent UTIs.  -Trending down ?-Unsure if this is related to recurrent UTI or stress reaction from fracture ?--WBC 19.6>23.5>12.1>10.4>9.1>11.4>10.0 -> 9.2 ?-Urine culture with insignificant growth ?-Completed 5-day course of IV ceftriaxone ?-Repeat CBC within 1 week  ?

## 2021-08-18 NOTE — Progress Notes (Signed)
?PROGRESS NOTE ? ? ? ?[name] Small  DJT:[id number] DOB: 1929-04-12 DOA: 08/17/2021 ?PCP: Billie Ruddy, MD  ? ? ?Brief Narrative:  ?[name] Small is a 88 year old female with past medical history significant for chronic atrial fibrillation on Xarelto, sick sinus syndrome s/p PPM, CKD stage IIIa, essential hypertension, HLD, GERD, vertebral compression fracture s/p vertebroplasty 02/2021 who presented to [facility] ED on 3/29 via EMS as a code stroke for evaluation of slurred speech, facial droop, left-sided weakness.  Last known normal at 1503/29 and was found down on the floor by family at 61.  Patient complained of right hip pain, unable to ambulate.  Patient was confused on ED presentation and stated that she was walking using her walker and fell; but is unsure how this occurred.  Reporting pain in her right hip.  Also reports dysuria.  Denies chest pain or shortness of breath.  Son states he was with the patient and she appeared normal total 1500; then he went out to get food and when he returned at 1530, she was on the floor and appeared confused with slurred speech, right leg pain and facial droop.  She was unable to get up from the floor. ? ?Patient was recently admitted 08/10/2021 through 08/12/2021 for acute metabolic encephalopathy secondary to UTI treated with vancomycin and Rocephin.  Patient was transition to nitrofurantoin based on prior cultures with plan outpatient urology follow-up due to recurrent UTIs. ? ?In the ED, temperature 98.0 ?F, HR 72, RR 16, BP 153/76, SPO2 93% on room air.  Sodium 137, potassium 3.8, chloride 103, CO2 25, glucose 144, BUN 15, creatinine 1.22, AST 37, ALT 35.  WBC 19.6, hemoglobin 14.3, platelets 208.  INR 2.9.  TSH 1.864.  EtOH level less than 10.  Chest x-ray with no acute cardiopulmonary disease process.  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  CTA head/neck with decreased  perfusion posterior left MCA, moderate narrowing distal left M1 segment, multifocal narrowing bilateral PCAs, 50% proximal left ICA stenosis, unchanged.  X-ray right hip/pelvis with acute displaced right femoral neck fracture.  Neurology and orthopedics were consulted.  Patient was not a tPA candidate due to chronic anticoagulation on Xarelto.  Hospital service consulted for further evaluation and management of hip fracture and concern for acute CVA.  ? ? ?Assessment & Plan: ?  ?Assessment and Plan: ?* Left-sided weakness ?Patient presented to the ED with acute onset slurred speech, facial droop, and left-sided weakness.  Last known well at 1500 on 08/17/2021. tPA not administered as she is chronically anticoagulated. 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 CT angio head and neck decreased perfusion posterior left MCA, mild to moderate narrowing in the distal left M1 segment, no additional focal stenosis or occlusion is seen in the left MCA branches, multifocal narrowing in the bilateral PCAs. No other ?hemodynamically significant intracranial stenosis, 50% proximal left ICA stenosis, unchanged, no additional hemodynamically significant stenosis in the neck.  LDL 67.  Hemoglobin A1c 5.7. ?--Neurology following, appreciate assistance ?--MRI of the brain without contrast: Pending ?--Echocardiogram: Pending ?--Aspirin 81 mg p.o. daily ?--Monitor on telemetry ?-- Seen by SLP, will await PT/OT evaluation until further determination regarding hip fracture ? ?Hip fracture ([place]) ?Patient with unwitnessed fall at home with x-ray notable for acute displaced right femoral neck fracture.   ?--Orthopedics, Dr. Marcelino Scot consulted by EDP ?--Morphine 1 mg IV every 4 hours as needed  severe pain ? ?Essential hypertension ?Home medications include metoprolol tartrate 37.5 mg p.o. twice daily ?--Allowing permissive hypertension in the setting of presumed CVA ? ?Atrial fibrillation, chronic  ([place]) ?--Amiodarone 200 mg p.o. daily ?--Holding home metoprolol tartrate 37.5 mg p.o. twice daily to allow permissive HTN as above. ?--Holding Xarelto for hip fracture ?--Monitor on telemetry ? ?Stage 3a chronic kidney disease (CKD) ([place]) ?Creatinine 1.2 on admission with baseline 0.8-1.0.  Was given a 500 mL IV fluid bolus in ED. ?--Cr 1.2>0.95 ?--Avoid nephrotoxins, renal dose all medications ?--BMP daily ? ?Leukocytosis ?Patient is afebrile, but history of recurrent UTIs.  Unsure if this is related to recurrent UTI or stress reaction from fracture ?--WBC 19.6>23.5 ?--UA/urine culture: Pending ?--Start ceftriaxone 1 g IV q24h ?--CBC daily ? ?Sick sinus syndrome ([place]) ?Status post PPM ?--Monitor on telemetry ? ? ? ?DVT prophylaxis: SCDs Start: 08/17/21 2227 ? ?  Code Status: DNR ?Family Communication: Son present at bedside this morning ? ?Disposition Plan:  ?Level of care: Telemetry Medical ?Status is: Inpatient ?Remains inpatient appropriate because: Awaiting MRI brain, further evaluation and recommendations per neurology, awaiting orthopedics consultation for right hip fracture ?  ? ?Consultants:  ?Neurology ?Orthopedics ? ?Procedures:  ?TTE: Pending ? ?Antimicrobials:  ?Ceftriaxone 3/30>> ? ? ?Subjective: ?Patient seen examined bedside, resting comfortably, sleeping but easily arousable.  Son present at bedside.  Remains slightly confused with left-sided weakness.  Complains of mild right hip pain.  No other complaints or concerns at this time.  Denies headache, no chest pain, no palpitations, no shortness of breath, no abdominal pain.  No acute events overnight per nursing staff. ? ?Objective: ?Vitals:  ? 08/18/21 0445 08/18/21 0545 08/18/21 0744 08/18/21 1144  ?BP: (!) 145/63 (!) 159/84 (!) 113/98 (!) 162/61  ?Pulse: 61 66 62 (!) 59  ?Resp: '18 20 15 '$ (!) 21  ?Temp:   100 ?F (37.8 ?C) 99.1 ?F (37.3 ?C)  ?TempSrc:   Axillary Oral  ?SpO2: 98% 97% 98% 100%  ?Weight:      ? ? ?Intake/Output Summary (Last 24 hours)  at 08/18/2021 1351 ?Last data filed at 08/18/2021 1100 ?Gross per 24 hour  ?Intake 500.83 ml  ?Output 150 ml  ?Net 350.83 ml  ? ?Filed Weights  ? 08/17/21 1800  ?Weight: 48.6 kg  ? ? ?Examination: ? ?Physical Exam: ?GEN: NAD, alert and oriented to place/time, but not person, elderly in appearance ?HEENT: NCAT, PERRL, EOMI, sclera clear, MMM ?PULM: CTAB w/o wheezes/crackles, normal respiratory effort, on room air ?CV: RRR w/o M/G/R ?GI: abd soft, NTND, NABS, no R/G/M ?MSK: no peripheral edema, right lower extremity shortened with external rotation ?NEURO: Left-sided weakness noted, fluent speech and no obvious facial droop; did not attempt mobilization in the right lower extremity due to fracture ?PSYCH: normal mood/affect ?Integumentary: dry/intact, no rashes or wounds ? ? ? ?Data Reviewed: I have personally reviewed following labs and imaging studies ? ?CBC: ?Recent Labs  ?Lab 08/17/21 ?1842 08/17/21 ?1844 08/18/21 ?0353  ?WBC 19.6*  --  23.5*  ?NEUTROABS 16.9*  --   --   ?HGB 14.3 15.6* 12.9  ?HCT 45.2 46.0 38.7  ?MCV 94.8  --  91.7  ?PLT 208  --  143*  ? ?Basic Metabolic Panel: ?Recent Labs  ?Lab 08/17/21 ?1842 08/17/21 ?1844 08/18/21 ?0353  ?NA 137 138 140  ?K 3.8 3.8 3.9  ?CL 103 101 107  ?CO2 25  --  24  ?GLUCOSE 144* 139* 148*  ?BUN '15 19 13  '$ ?CREATININE 1.22* 1.10* 0.95  ?  CALCIUM 9.1  --  8.6*  ? ?GFR: ?Estimated Creatinine Clearance: 30.2 mL/min (by C-G formula based on SCr of 0.95 mg/dL). ?Liver Function Tests: ?Recent Labs  ?Lab 08/17/21 ?1842  ?AST 37  ?ALT 35  ?ALKPHOS 81  ?BILITOT 1.0  ?PROT 6.7  ?ALBUMIN 3.8  ? ?No results for input(s): LIPASE, AMYLASE in the last 168 hours. ?No results for input(s): AMMONIA in the last 168 hours. ?Coagulation Profile: ?Recent Labs  ?Lab 08/17/21 ?1842  ?INR 2.9*  ? ?Cardiac Enzymes: ?No results for input(s): CKTOTAL, CKMB, CKMBINDEX, TROPONINI in the last 168 hours. ?BNP (last 3 results) ?No results for input(s): PROBNP in the last 8760 hours. ?HbA1C: ?Recent Labs  ?   08/17/21 ?1842  ?HGBA1C 5.7*  ? ?CBG: ?Recent Labs  ?Lab 08/17/21 ?1837  ?GLUCAP 139*  ? ?Lipid Profile: ?Recent Labs  ?  08/17/21 ?1842  ?LDLDIRECT 64.0  ? ?Thyroid Function Tests: ?Recent Labs  ?  08/18/21 ?1143  ?TSH 1

## 2021-08-18 NOTE — Progress Notes (Addendum)
STROKE TEAM PROGRESS NOTE  ? ?INTERVAL HISTORY ?No family at the bedside. 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 Awaiting ortho recommendation prior to resuming anticoagulation. From a neurological standpoint she is high risk for worsening stroke symptoms given the possibility of hypotension r/t anesthesia however prior to this hospitalization she was able to ambulate with assistance and was participating in home health PT. If surgery is not recommended we recommend resuming anticoagulation at her home dose.  If surgery is recommended recommend starting IV heparin drip till 1 hour before the surgery and then stop it and resume after surgery when safe ? ?Vitals:  ? 08/18/21 0445 08/18/21 0545 08/18/21 0744 08/18/21 1144  ?BP: (!) 145/63 (!) 159/84 (!) 113/98 (!) 162/61  ?Pulse: 61 66 62 (!) 59  ?Resp: '18 20 15 '$ (!) 21  ?Temp:   100 ?F (37.8 ?C) 99.1 ?F (37.3 ?C)  ?TempSrc:   Axillary Oral  ?SpO2: 98% 97% 98% 100%  ?Weight:      ? ?CBC:  ?Recent Labs  ?Lab 08/17/21 ?1842 08/17/21 ?1844 08/18/21 ?0353  ?WBC 19.6*  --  23.5*  ?NEUTROABS 16.9*  --   --   ?HGB 14.3 15.6* 12.9  ?HCT 45.2 46.0 38.7  ?MCV 94.8  --  91.7  ?PLT 208  --  143*  ? ?Basic Metabolic Panel:  ?Recent Labs  ?Lab 08/17/21 ?1842 08/17/21 ?1844 08/18/21 ?0353  ?NA 137 138 140  ?K 3.8 3.8 3.9  ?CL 103 101 107  ?CO2 25  --  24  ?GLUCOSE 144* 139* 148*  ?BUN '15 19 13  '$ ?CREATININE 1.22* 1.10* 0.95  ?CALCIUM 9.1  --  8.6*  ? ?Lipid Panel: No results for input(s): CHOL, TRIG, HDL, CHOLHDL, VLDL, LDLCALC in the last 168 hours. ?HgbA1c:  ?Recent Labs  ?Lab 08/17/21 ?1842  ?HGBA1C 5.7*  ? ?Urine Drug Screen: No results for input(s): LABOPIA, COCAINSCRNUR, LABBENZ, AMPHETMU, THCU, LABBARB in the last 168 hours.  ?Alcohol Level  ?Recent Labs  ?Lab 08/17/21 ?1842  ?ETH <10  ? ? ?IMAGING past 24 hours ?DG Chest 1 View ? ?Result Date:  08/17/2021 ?CLINICAL DATA:  Code stroke. EXAM: CHEST  1 VIEW COMPARISON:  Chest radiograph dated 04/06/2021. FINDINGS: No focal consolidation, pleural effusion, pneumothorax. There is diffuse chronic interstitial coarsening and bronchitic changes. Faint densities in the lower lung fields, likely atelectasis. The cardiac silhouette is within normal limits. Atherosclerotic calcification of the aorta. Left pectoral pacemaker device. Degenerative changes of the spine. No acute osseous pathology. IMPRESSION: No acute cardiopulmonary process. Electronically Signed   By: Anner Crete M.D.   On: 08/17/2021 20:30  ? ?MR BRAIN WO CONTRAST ? ?Result Date: 08/18/2021 ?CLINICAL DATA:  Neuro deficit, acute, stroke suspected EXAM: MRI HEAD WITHOUT CONTRAST TECHNIQUE: Multiplanar, multiecho pulse sequences of the brain and surrounding structures were obtained without intravenous contrast. COMPARISON:  CT head August 17, 2021. FINDINGS: Moderately motion limited study.  Within this limitation: Brain: Restricted diffusion along the posterior aspect of the remote left parietal infarct, compatible with acute on chronic infarct. Additional small focus of restricted diffusion in the left periventricular frontal lobe, also compatible with acute infarcts. No significant mass effect. No midline shift. Moderate scattered T2/FLAIR hyperintensities in the white matter, nonspecific but compatible with chronic microvascular ischemic disease. No evidence of acute hemorrhage, mass lesion, or extra-axial fluid collection. Moderate cerebral atrophy  with ex vacuo ventricular dilation. No hydrocephalus. Vascular: Major arterial flow voids are maintained at the skull base. Skull and upper cervical spine: Normal marrow signal. Sinuses/Orbits: Clear sinuses.  No acute orbital findings. Other: No mastoid effusions. IMPRESSION: 1. Moderately motion limited study with evidence of acute left frontal and parietal infarcts. The left parietal infarct is along  the posterior aspect of a remote left parietal infarct. 2. Moderate chronic microvascular ischemic disease. 3.  Cerebral atrophy (ICD10-G31.9). Electronically Signed   By: Margaretha Sheffield M.D.   On: 08/18/2021 14:42  ? ?DG Hip Unilat W or Wo Pelvis 2-3 Views Right ? ?Result Date: 08/17/2021 ?CLINICAL DATA:  [place].  Right hip pain. EXAM: DG HIP (WITH OR WITHOUT PELVIS) 2-3V RIGHT COMPARISON:  CT scan 08/02/2021 FINDINGS: Acute displaced right femoral neck fracture. Stable left hip hardware. The pubic symphysis and SI joints are intact. The bony pelvis is grossly intact. IMPRESSION: Acute displaced right femoral neck fracture. Electronically Signed   By: Marijo Sanes M.D.   On: 08/17/2021 20:31  ? ?CT HEAD CODE STROKE WO CONTRAST ? ?Result Date: 08/17/2021 ?CLINICAL DATA:  Code stroke. Neuro deficit, acute, stroke suspected. Found on the ground with speech disturbance. EXAM: CT HEAD WITHOUT CONTRAST TECHNIQUE: Contiguous axial images were obtained from the base of the skull through the vertex without intravenous contrast. RADIATION DOSE REDUCTION: This exam was performed according to the departmental dose-optimization program which includes automated exposure control, adjustment of the mA and/or kV according to patient size and/or use of iterative reconstruction technique. COMPARISON:  08/10/2021 FINDINGS: Brain: No acute CT finding. Generalized atrophy. Chronic small-vessel ischemic changes of the white matter. Old left parietal cortical and subcortical infarction. No mass, hemorrhage, hydrocephalus or extra-axial collection. Vascular: There is atherosclerotic calcification of the major vessels at the base of the brain. Skull: Negative Sinuses/Orbits: Clear except for a few scattered retention cysts. Other: None ASPECTS ([place] Stroke Program Early CT Score) - Ganglionic level infarction (caudate, lentiform nuclei, internal capsule, insula, M1-M3 cortex): 7 - Supraganglionic infarction (M4-M6 cortex): 3, allowing  for the old infarction. Total score (0-10 with 10 being normal): 10, allowing for the old left parietal infarction. IMPRESSION: 1. No acute CT finding. Atrophy and chronic small-vessel ischemic changes. 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 2. ASPECTS is 10, allowing for the old infarction. 3. These results were communicated to Dr. Quinn Axe at 6:56 pm on 08/17/2021 by text page via the [facility] messaging system. Electronically Signed   By: Nelson Chimes M.D.   On: 08/17/2021 18:58  ? ?CT ANGIO HEAD NECK W WO CM W PERF (CODE STROKE) ? ?Result Date: 08/17/2021 ?CLINICAL DATA:  Neuro deficit, speech changes, stroke suspected EXAM: CT ANGIOGRAPHY HEAD AND NECK CT PERFUSION BRAIN TECHNIQUE: Multidetector CT imaging of the head and neck was performed using the standard protocol during bolus administration of intravenous contrast. Multiplanar CT image reconstructions and MIPs were obtained to evaluate the vascular anatomy. Carotid stenosis measurements (when applicable) are obtained utilizing NASCET criteria, using the distal internal carotid diameter as the denominator. Multiphase CT imaging of the brain was performed following IV bolus contrast injection. Subsequent parametric perfusion maps were calculated using RAPID software. RADIATION DOSE REDUCTION: This exam was performed according to the departmental dose-optimization program which includes automated exposure control, adjustment of the mA and/or kV according to patient size and/or use of iterative reconstruction technique. CONTRAST:  156m OMNIPAQUE IOHEXOL 350 MG/ML SOLN COMPARISON:  12/27/2015 common correlation is also made with same day CT head FINDINGS: CT  HEAD FINDINGS For noncontrast findings, please see same day CT head. CTA NECK FINDINGS Aortic arch: Standard branching. Imaged portion shows no evidence of aneurysm or dissection. No significant stenosis of the major arch vessel origins. Redemonstrated ductus bump. Right carotid system: No evidence of dissection,  stenosis (50% or greater) or occlusion. Left carotid system: Approximate 50% stenosis in the proximal left ICA. No dissection or occlusion. Vertebral arteries: Left dominant. No acute osseous abnormality. No

## 2021-08-18 NOTE — Assessment & Plan Note (Addendum)
-  Patient presented to the ED with acute onset slurred speech, facial droop, and left-sided weakness.  ?-Last known well at 1500 on 08/17/2021. tPA not administered as she is chronically anticoagulated. 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 CT angio head and neck decreased perfusion posterior left MCA, mild to moderate narrowing in the distal left M1 segment, no additional focal stenosis or occlusion is seen in the left MCA branches, multifocal narrowing in the bilateral PCAs. No other hemodynamically significant intracranial stenosis, 50% proximal left ICA stenosis, unchanged, no additional hemodynamically significant stenosis in the neck.   ?-LDL 67.  Hemoglobin A1c 5.7.  ?-MR brain with acute left frontal and parietal infarcts.  TTE with LVEF greater than 75%, LV with no regional wall motion normalities, mild LVH, grade 1 diastolic dysfunction, trivial MR, no aortic stenosis, IVC normal in size, no evidence of intra-atrial shunt. ?-Neurology was following, appreciate assistance ?-C/w Aspirin 81 mg p.o. daily and Xarelto '15mg'$  PO daily ?--Monitor on telemetry ?--Seen by SLP ?--PT/OT recommend SNF placement ?--Continue therapy efforts while inpatient ?--SNF placement per TOC ?

## 2021-08-18 NOTE — Telephone Encounter (Signed)
Son wanted Dr. Volanda Napoleon to know that patient had a stroke and fell.  When she fell she broke her hip.  She is in the hospital. ?

## 2021-08-18 NOTE — Progress Notes (Signed)
Patient ID: [name] Small, female   DOB: 07/24/1928, 88 y.o.   MRN: [id number] ? ?Orthopedics aware of pt, unable to see today as pt was in MRI. Will plan formal consult in AM. Pt has femoral neck fx on right which will require hemiarthroplasty. Due to xarelto use will have to wait until Saturday or Sunday earliest or when medicine clears. ? ? ? ?Lisette Abu, PA-C ?Orthopedic Surgery ?[phone] ? ?

## 2021-08-18 NOTE — Assessment & Plan Note (Addendum)
-  Creatinine 1.2 on admission with baseline 0.8-1.0.   ?-Was given a 500 mL IV fluid bolus in ED. ?--Cr 1.2>0.95>1.04>0.82>0.67>0.92; Now BUN/Cr is stable at 17/0.76 ?-Avoid further nephrotoxic medication, consciousness, hypotension and adjust medications ?-Repeat CMP within 1 week  ?

## 2021-08-18 NOTE — Progress Notes (Signed)
ANTICOAGULATION CONSULT NOTE - Initial Consult ? ?Pharmacy Consult for heparin  ?Indication: chest pain/ACS ? ?Allergies  ?Allergen Reactions  ? Septra Ds [Sulfamethoxazole-Trimethoprim]   ? Lisinopril Other (See Comments)  ?  Cough  ? ? ?Patient Measurements: ?Weight: 48.6 kg (107 lb 2.3 oz) ? ? ?Vital Signs: ?Temp: 99.1 ?F (37.3 ?C) (03/30 1144) ?Temp Source: Oral (03/30 1144) ?BP: 162/61 (03/30 1144) ?Pulse Rate: 59 (03/30 1144) ? ?Labs: ?Recent Labs  ?  08/17/21 ?1842 08/17/21 ?1844 08/18/21 ?0353  ?HGB 14.3 15.6* 12.9  ?HCT 45.2 46.0 38.7  ?PLT 208  --  143*  ?APTT 40*  --   --   ?LABPROT 30.3*  --   --   ?INR 2.9*  --   --   ?CREATININE 1.22* 1.10* 0.95  ? ? ?Estimated Creatinine Clearance: 30.2 mL/min (by C-G formula based on SCr of 0.95 mg/dL). ? ? ?Medical History: ?Past Medical History:  ?Diagnosis Date  ? Atrial fibrillation ([place])   ? Bleeding stomach ulcer 1980s?  ? Cancer ([place]) Meloanoma left leg  ? DIVERTICULOSIS, COLON 11/22/2006  ? Headache(784.0)   ? "very often; not regular" (11/25/2015)  ? History of blood transfusion 1980s  ? "when I had the bleeding ulcer"  ? HYPERLIPIDEMIA 11/22/2006  ? Hypertension   ? Menopausal syndrome (hot flashes)   ? OSTEOPOROSIS 11/22/2006  ? Presence of permanent cardiac pacemaker   ? ? ?Medications:  ?Medications Prior to Admission  ?Medication Sig Dispense Refill Last Dose  ? acetaminophen (TYLENOL) 500 MG tablet Take 1 tablet (500 mg total) by mouth every 6 (six) hours as needed. (Patient taking differently: Take 500 mg by mouth every 6 (six) hours as needed for moderate pain, mild pain or fever.) 30 tablet 0 Past Month  ? amiodarone (PACERONE) 200 MG tablet TAKE 1 TABLET(200 MG) BY MOUTH DAILY (Patient taking differently: Take 200 mg by mouth daily with lunch.) 90 tablet 2 08/17/2021  ? atorvastatin (LIPITOR) 40 MG tablet TAKE 1 TABLET(40 MG) BY MOUTH DAILY (Patient taking differently: Take 40 mg by mouth daily with lunch.) 90 tablet 0 08/17/2021  ? docusate sodium (COLACE)  100 MG capsule Take 1 capsule (100 mg total) by mouth 2 (two) times daily. 10 capsule 0 08/17/2021  ? metoprolol tartrate (LOPRESSOR) 25 MG tablet Take 1.5 tablets (37.5 mg total) by mouth 2 (two) times daily. 270 tablet 3 08/17/2021 at 0800  ? nitrofurantoin, macrocrystal-monohydrate, (MACROBID) 100 MG capsule Take 1 capsule (100 mg total) by mouth at bedtime. 30 capsule 0 Past Week  ? ondansetron (ZOFRAN) 4 MG tablet Take 1 tablet (4 mg total) by mouth daily as needed for nausea or vomiting. 30 tablet 1 Past Week  ? oxyCODONE (ROXICODONE) 5 MG immediate release tablet Take 1 tablet (5 mg total) by mouth every 8 (eight) hours as needed for up to 10 doses for severe pain. 10 tablet 0 Past Week  ? pantoprazole (PROTONIX) 40 MG tablet Take 1 tablet (40 mg total) by mouth daily. 30 tablet 0 08/17/2021  ? senna-docusate (SENOKOT-S) 8.6-50 MG tablet Take 1 tablet by mouth daily as needed for up to 15 doses for mild constipation. To prevent constipation when taking oxycodone 15 tablet 0 Past Month  ? XARELTO 15 MG TABS tablet TAKE 1 TABLET(15 MG) BY MOUTH DAILY WITH DINNER (Patient taking differently: Take 15 mg by mouth daily with lunch.) 30 tablet 5 08/17/2021 at 1200  ? ?Scheduled:  ? [START ON 08/19/2021] amiodarone  200 mg Oral Q lunch  ?  aspirin  81 mg Oral Daily  ? [START ON 08/19/2021] atorvastatin  40 mg Oral Q lunch  ? ? 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 Plans are for hip surgery and pharmacy consultef to dose heparin.  ? ?Goal of Therapy:  ?Heparin level 0.3-0.5 units/ml ?aPTT 66-85 seconds ?Monitor platelets by anticoagulation protocol: Yes ?  ?Plan:  ?-no heparin bolus ?-Start heparin at 600 units/hr ?-aPTT and heparin level in 8 hours and daily wth CBC daily ? ?Hildred Laser, PharmD ?Clinical Pharmacist ?**Pharmacist phone directory can now be found on [email or web address] ([email or web address]).  Listed under [place]. ? ? ? ?

## 2021-08-18 NOTE — Assessment & Plan Note (Addendum)
-  Home medications include metoprolol tartrate 37.5 mg p.o. twice daily ?-Resumed home metoprolol 37.'5mg'$  BID ?-Continue to Monitor BP per Protocol  ?-Last BP reading was 128/57 ?

## 2021-08-18 NOTE — Progress Notes (Signed)
Echocardiogram ?2D Echocardiogram has been performed. ? ?Oneal Deputy Lyriq Finerty RDCS ?08/18/2021, 3:43 PM ?

## 2021-08-18 NOTE — Evaluation (Signed)
Clinical/Bedside Swallow Evaluation ?Patient Details  ?Name: [name] Small ?MRN: [id number] ?Date of Birth: 1929/02/06 ? ?Today's Date: 08/18/2021 ?Time: SLP Start Time (ACUTE ONLY): 9562 SLP Stop Time (ACUTE ONLY): 1133 ?SLP Time Calculation (min) (ACUTE ONLY): 12 min ? ?Past Medical History:  ?Past Medical History:  ?Diagnosis Date  ? Atrial fibrillation ([place])   ? Bleeding stomach ulcer 1980s?  ? Cancer ([place]) Meloanoma left leg  ? DIVERTICULOSIS, COLON 11/22/2006  ? Headache(784.0)   ? "very often; not regular" (11/25/2015)  ? History of blood transfusion 1980s  ? "when I had the bleeding ulcer"  ? HYPERLIPIDEMIA 11/22/2006  ? Hypertension   ? Menopausal syndrome (hot flashes)   ? OSTEOPOROSIS 11/22/2006  ? Presence of permanent cardiac pacemaker   ? ?Past Surgical History:  ?Past Surgical History:  ?Procedure Laterality Date  ? APPENDECTOMY    ? BUNIONECTOMY Right   ? CATARACT EXTRACTION W/ INTRAOCULAR LENS  IMPLANT, BILATERAL Bilateral 2000s  ? CHOLECYSTECTOMY OPEN    ? EP IMPLANTABLE DEVICE N/A 11/25/2015  ? Procedure: Pacemaker Implant;  Surgeon: Will Meredith Leeds, MD;  Location: [facility];  Service: Cardiovascular;  Laterality: N/A;  ? FEMUR IM NAIL Left 07/08/2014  ? Procedure: INTRAMEDULLARY (IM) NAIL ;  Surgeon: Meredith Pel, MD;  Location: [facility];  Service: Orthopedics;  Laterality: Left;  ? FRACTURE SURGERY    ? IR KYPHO LUMBAR INC FX REDUCE BONE BX UNI/BIL CANNULATION INC/IMAGING  03/21/2021  ? REVISION TOTAL HIP ARTHROPLASTY Left 06/2014  ? ?HPI:  ?[name] Small is a 88 y.o. female with medical history significant of chronic A-fib on Xarelto, sick sinus syndrome status post pacemaker, CKD stage IIIa, hypertension, hyperlipidemia, GERD, vertebral compression.  Recently admitted 08/10/2021-08/12/2021 for acute metabolic encephalopathy secondary to UTI. Patient presented with slurred speech, facial droop, and left-sided weakness and found down. Found to have right femoral neck fracture. 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 MRI pending.  ?  ?Assessment / Plan / Recommendation  ?Clinical Impression ? Limited bedside swallow assessment completed due to pt's lethargy. Pt has a weak volitional cough and reduced lateral lingual precsion. She consumed 2 bites puree, straw sips thin liquid and was unable to initiate mastication of cracker and was ultimately removed from oral cavity. When pt is able to maintain alertness and attention, recommend puree (Dys 1), crush meds, straws okay and full assist. ST will follow up for safety, efficiency and upgrade when able.  ? ? ?SLP Visit Diagnosis: Dysphagia, unspecified (R13.10) ?   ?Aspiration Risk ? Mild aspiration risk  ?  ?Diet Recommendation Dysphagia 1 (Puree);Thin liquid (until after surgery later this afternoon)  ? ?Liquid Administration via: Cup;Straw ?Medication Administration: Crushed with puree ?Supervision: Staff to assist with self feeding;Full supervision/cueing for compensatory strategies ?Compensations: Slow rate;Small sips/bites ?Postural Changes: Seated upright at 90 degrees  ?  ?Other  Recommendations Oral Care Recommendations: Oral care BID   ? ?Recommendations for follow up therapy are one component of a multi-disciplinary discharge planning process, led by the attending physician.  Recommendations may be updated based on patient status, additional functional criteria and insurance authorization. ? ?Follow up Recommendations Other (comment) (TBD)  ? ? ?  ?Assistance Recommended at Discharge Frequent or constant Supervision/Assistance  ?Functional Status Assessment Patient has had a recent decline in their functional status and demonstrates the ability to make significant improvements in function in a reasonable and predictable amount of time.  ?Frequency and Duration min 2x/week  ?2 weeks ?  ?   ? ?  Prognosis Prognosis for Safe Diet Advancement: Good  ? ?  ? ?Swallow Study   ?General Date of Onset: 08/17/21 ?HPI: [name] Small is a 88 y.o.  female with medical history significant of chronic A-fib on Xarelto, sick sinus syndrome status post pacemaker, CKD stage IIIa, hypertension, hyperlipidemia, GERD, vertebral compression.  Recently admitted 08/10/2021-08/12/2021 for acute metabolic encephalopathy secondary to UTI. Patient presented with slurred speech, facial droop, and left-sided weakness and found down. Found to have right femoral neck fracture. 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 MRI pending. ?Type of Study: Bedside Swallow Evaluation ?Previous Swallow Assessment:  (none) ?Diet Prior to this Study: NPO ?Temperature Spikes Noted: No ?Respiratory Status: Nasal cannula ?History of Recent Intubation: No ?Behavior/Cognition: Lethargic/Drowsy;Cooperative;Requires cueing ?Oral Cavity Assessment: Within Functional Limits ?Oral Care Completed by SLP: No ?Oral Cavity - Dentition:  (unable to fully view- opened partial) ?Vision: Functional for self-feeding ?Self-Feeding Abilities: Needs assist ?Patient Positioning: Upright in bed ?Baseline Vocal Quality: Normal ?Volitional Cough: Weak  ?  ?Oral/Motor/Sensory Function Overall Oral Motor/Sensory Function: Other (comment) (imprecise lateralization)   ?Ice Chips Ice chips: Not tested   ?Thin Liquid Thin Liquid: Within functional limits ?Presentation: Straw  ?  ?Nectar Thick Nectar Thick Liquid: Not tested   ?Honey Thick Honey Thick Liquid: Not tested   ?Puree Puree: Within functional limits   ?Solid ? ? ?  Solid: Impaired ?Oral Phase Impairments:  (could not accept effectively)  ? ?  ? ?Houston Siren ?08/18/2021,12:02 PM ? ? ? ?

## 2021-08-18 NOTE — Progress Notes (Signed)
?  Transition of Care (TOC) Screening Note ? ? ?Patient Details  ?Name: [name] Small ?Date of Birth: [birth year] ? ? ?Transition of Care (TOC) CM/SW Contact:    ?Pollie Friar, RN ?Phone Number: ?08/18/2021, 3:04 PM ? ? ? ?Transition of Care Department [facility]) has reviewed patient. We will continue to monitor patient advancement through interdisciplinary progression rounds. If new patient transition needs arise, please place a TOC consult. ?  ?

## 2021-08-19 ENCOUNTER — Encounter (HOSPITAL_COMMUNITY): Payer: Self-pay | Admitting: Internal Medicine

## 2021-08-19 ENCOUNTER — Inpatient Hospital Stay (HOSPITAL_COMMUNITY): Payer: Medicare Other

## 2021-08-19 DIAGNOSIS — I639 Cerebral infarction, unspecified: Secondary | ICD-10-CM | POA: Diagnosis not present

## 2021-08-19 LAB — MAGNESIUM: Magnesium: 2 mg/dL (ref 1.7–2.4)

## 2021-08-19 LAB — BASIC METABOLIC PANEL
Anion gap: 8 (ref 5–15)
BUN: 11 mg/dL (ref 8–23)
CO2: 26 mmol/L (ref 22–32)
Calcium: 8.3 mg/dL — ABNORMAL LOW (ref 8.9–10.3)
Chloride: 104 mmol/L (ref 98–111)
Creatinine, Ser: 1.04 mg/dL — ABNORMAL HIGH (ref 0.44–1.00)
GFR, Estimated: 51 mL/min — ABNORMAL LOW (ref >60)
Glucose, Bld: 112 mg/dL — ABNORMAL HIGH (ref 70–99)
Potassium: 4 mmol/L (ref 3.5–5.1)
Sodium: 138 mmol/L (ref 135–145)

## 2021-08-19 LAB — APTT
aPTT: 122 seconds — ABNORMAL HIGH (ref 24–36)
aPTT: 81 seconds — ABNORMAL HIGH (ref 24–36)
aPTT: 60 seconds — ABNORMAL HIGH (ref 24–36)

## 2021-08-19 LAB — URINE CULTURE: Culture: <10000 — AB

## 2021-08-19 LAB — CBC
HCT: 33.3 % — ABNORMAL LOW (ref 36.0–46.0)
Hemoglobin: 11 g/dL — ABNORMAL LOW (ref 12.0–15.0)
MCH: 30.4 pg (ref 26.0–34.0)
MCHC: 33 g/dL (ref 30.0–36.0)
MCV: 92 fL (ref 80.0–100.0)
Platelets: 100 10*3/uL — ABNORMAL LOW (ref 150–400)
RBC: 3.62 MIL/uL — ABNORMAL LOW (ref 3.87–5.11)
RDW: 14.4 % (ref 11.5–15.5)
WBC: 12.1 10*3/uL — ABNORMAL HIGH (ref 4.0–10.5)
nRBC: 0 % (ref 0.0–0.2)

## 2021-08-19 LAB — HEPARIN LEVEL (UNFRACTIONATED): Heparin Unfractionated: >1.1 IU/mL — ABNORMAL HIGH (ref 0.30–0.70)

## 2021-08-19 MED ORDER — CEFAZOLIN SODIUM-DEXTROSE 2-4 GM/100ML-% IV SOLN
2.0000 g | Freq: Once | INTRAVENOUS | Status: DC
  Filled 2021-08-19 (×2): qty 100

## 2021-08-19 MED ORDER — HEPARIN (PORCINE) 25000 UT/250ML-% IV SOLN
500.0000 [IU]/h | INTRAVENOUS | Status: DC
  Filled 2021-08-19: qty 250

## 2021-08-19 MED ORDER — SODIUM CHLORIDE 0.9 % IV SOLN: INTRAVENOUS | Status: AC

## 2021-08-19 NOTE — Progress Notes (Signed)
?PROGRESS NOTE ? ? ? ?BRANDY ZUBA  IZT:[id number] DOB: 1929-03-13 DOA: 08/17/2021 ?PCP: Billie Ruddy, MD  ? ? ?Brief Narrative:  ?[name] Small is a 88 year old female with past medical history significant for chronic atrial fibrillation on Xarelto, sick sinus syndrome s/p PPM, CKD stage IIIa, essential hypertension, HLD, GERD, vertebral compression fracture s/p vertebroplasty 02/2021 who presented to [facility] ED on 3/29 via EMS as a code stroke for evaluation of slurred speech, facial droop, left-sided weakness.  Last known normal at 1503/29 and was found down on the floor by family at 88.  Patient complained of right hip pain, unable to ambulate.  Patient was confused on ED presentation and stated that she was walking using her walker and fell; but is unsure how this occurred.  Reporting pain in her right hip.  Also reports dysuria.  Denies chest pain or shortness of breath.  Son states he was with the patient and she appeared normal total 1500; then he went out to get food and when he returned at 1530, she was on the floor and appeared confused with slurred speech, right leg pain and facial droop.  She was unable to get up from the floor. ? ?Patient was recently admitted 08/10/2021 through 08/12/2021 for acute metabolic encephalopathy secondary to UTI treated with vancomycin and Rocephin.  Patient was transition to nitrofurantoin based on prior cultures with plan outpatient urology follow-up due to recurrent UTIs. ? ?In the ED, temperature 98.0 ?F, HR 72, RR 16, BP 153/76, SPO2 93% on room air.  Sodium 137, potassium 3.8, chloride 103, CO2 25, glucose 144, BUN 15, creatinine 1.22, AST 37, ALT 35.  WBC 19.6, hemoglobin 14.3, platelets 208.  INR 2.9.  TSH 1.864.  EtOH level less than 10.  Chest x-ray with no acute cardiopulmonary disease process.  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  CTA head/neck with decreased  perfusion posterior left MCA, moderate narrowing distal left M1 segment, multifocal narrowing bilateral PCAs, 50% proximal left ICA stenosis, unchanged.  X-ray right hip/pelvis with acute displaced right femoral neck fracture.  Neurology and orthopedics were consulted.  Patient was not a tPA candidate due to chronic anticoagulation on Xarelto.  Hospital service consulted for further evaluation and management of hip fracture and concern for acute CVA.  ? ? ?Assessment & Plan: ?  ?Assessment and Plan: ?* Acute CVA (cerebrovascular accident) ([place]) ?Patient presented to the ED with acute onset slurred speech, facial droop, and left-sided weakness.  Last known well at 1500 on 08/17/2021. tPA not administered as she is chronically anticoagulated. 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 CT angio head and neck decreased perfusion posterior left MCA, mild to moderate narrowing in the distal left M1 segment, no additional focal stenosis or occlusion is seen in the left MCA branches, multifocal narrowing in the bilateral PCAs. No other ?hemodynamically significant intracranial stenosis, 50% proximal left ICA stenosis, unchanged, no additional hemodynamically significant stenosis in the neck.  LDL 67.  Hemoglobin A1c 5.7. MR brain with acute left frontal and parietal infarcts.  TTE with LVEF greater than 75%, LV with no regional wall motion normalities, mild LVH, grade 1 diastolic dysfunction, trivial MR, no aortic stenosis, IVC normal in size, no evidence of intra-atrial shunt. ?--Neurology following, appreciate assistance ?--Aspirin 81 mg p.o. daily ?--holding home Xarelto; on heparin gtt ?--Monitor on telemetry ?--Seen by SLP, will await PT/OT evaluation until  postoperative ? ?Hip fracture ([place]) ?Patient with unwitnessed fall at home with x-ray notable for acute displaced right femoral neck fracture.   ?--Orthopedics following, appreciate assistance ?--Holding home Xarelto, starting heparin  drip as above ?--Oxycodone 5 mg p.o. q6h PRN moderate pain ?--Morphine 1 mg IV every 4 hours as needed severe pain ?--Robaxin '500mg'$  PO q6h PRN muscle spasm ?--Plan right hemiarthroplasty on 08/20/2021, n.p.o. after midnight ? ? ?Essential hypertension ?Home medications include metoprolol tartrate 37.5 mg p.o. twice daily ?--Allowing permissive hypertension in the setting of acute CVA ? ?Atrial fibrillation, chronic ([place]) ?--Amiodarone 200 mg p.o. daily ?--Holding home metoprolol tartrate 37.5 mg p.o. twice daily to allow permissive HTN as above. ?--Holding Xarelto for hip fracture; started on heparin gtt ?--Monitor on telemetry ? ?Stage 3a chronic kidney disease (CKD) ([place]) ?Creatinine 1.2 on admission with baseline 0.8-1.0.  Was given a 500 mL IV fluid bolus in ED. ?--Cr 1.2>0.95>1.04 ?--Avoid nephrotoxins, renal dose all medications ?--BMP daily ? ?Leukocytosis ?Patient is afebrile, but history of recurrent UTIs.  Unsure if this is related to recurrent UTI or stress reaction from fracture ?--WBC 19.6>23.5>12.1 ?--Urine culture with insignificant growth ?--ceftriaxone 1 g IV q24h; plan 5-day course ?--CBC daily ? ?Sick sinus syndrome ([place]) ?Status post PPM ?--Monitor on telemetry ? ? ? ?DVT prophylaxis: SCDs Start: 08/17/21 2227 ? ?  Code Status: DNR ?Family Communication: No family present at bedside this morning, updated son Elenore Rota via telephone this afternoon ? ?Disposition Plan:  ?Level of care: Telemetry Medical ?Status is: Inpatient ?Remains inpatient appropriate because: Awaiting surgical intervention for right hip fracture planned for tomorrow ?  ? ?Consultants:  ?Neurology ?Orthopedics ? ?Procedures:  ?TTE:  ?Right hip hemiarthroplasty pending for 4/1 ? ?Antimicrobials:  ?Ceftriaxone 3/30>> ? ? ?Subjective: ?Patient seen examined bedside, resting comfortably. No family present this morning.  Orthopedics PA present, plans for surgical intervention tomorrow.  Pain controlled.  No other complaints or concerns  at this time.  Denies headache, no chest pain, no palpitations, no shortness of breath, no abdominal pain.  No acute events overnight per nursing staff. ? ?Objective: ?Vitals:  ? 08/19/21 0445 08/19/21 0545 08/19/21 0834 08/19/21 1122  ?BP: (!) 144/50 (!) 144/78 140/61 (!) 156/55  ?Pulse: (!) 59 65 64 62  ?Resp: '14 15 16 '$ (!) 21  ?Temp: 98 ?F (36.7 ?C)  98.5 ?F (36.9 ?C) 98.2 ?F (36.8 ?C)  ?TempSrc:   Oral   ?SpO2: 100% 100% 100% 100%  ?Weight:      ? ? ?Intake/Output Summary (Last 24 hours) at 08/19/2021 1254 ?Last data filed at 08/19/2021 0600 ?Gross per 24 hour  ?Intake 687.23 ml  ?Output --  ?Net 687.23 ml  ? ?Filed Weights  ? 08/17/21 1800  ?Weight: 48.6 kg  ? ? ?Examination: ? ?Physical Exam: ?GEN: NAD, alert and oriented to place/time, but not person, elderly in appearance ?HEENT: NCAT, PERRL, EOMI, sclera clear, MMM ?PULM: CTAB w/o wheezes/crackles, normal respiratory effort, on room air ?CV: RRR w/o M/G/R ?GI: abd soft, NTND, NABS, no R/G/M ?MSK: no peripheral edema, right lower extremity shortened with external rotation ?NEURO: Left-sided weakness noted, fluent speech and no obvious facial droop; did not attempt mobilization in the right lower extremity due to fracture ?PSYCH: normal mood/affect ?Integumentary: dry/intact, no rashes or wounds ? ? ? ?Data Reviewed: I have personally reviewed following labs and imaging studies ? ?CBC: ?Recent Labs  ?Lab 08/17/21 ?1842 08/17/21 ?1844 08/18/21 ?6440 08/19/21 ?3474  ?WBC 19.6*  --  23.5* 12.1*  ?NEUTROABS 16.9*  --   --   --   ?  HGB 14.3 15.6* 12.9 11.0*  ?HCT 45.2 46.0 38.7 33.3*  ?MCV 94.8  --  91.7 92.0  ?PLT 208  --  143* 100*  ? ?Basic Metabolic Panel: ?Recent Labs  ?Lab 08/17/21 ?1842 08/17/21 ?1844 08/18/21 ?0623 08/19/21 ?7628  ?NA 137 138 140 138  ?K 3.8 3.8 3.9 4.0  ?CL 103 101 107 104  ?CO2 25  --  24 26  ?GLUCOSE 144* 139* 148* 112*  ?BUN '15 19 13 11  '$ ?CREATININE 1.22* 1.10* 0.95 1.04*  ?CALCIUM 9.1  --  8.6* 8.3*  ?MG  --   --   --  2.0   ? ?GFR: ?Estimated Creatinine Clearance: 27.6 mL/min (A) (by C-G formula based on SCr of 1.04 mg/dL (H)). ?Liver Function Tests: ?Recent Labs  ?Lab 08/17/21 ?1842  ?AST 37  ?ALT 35  ?ALKPHOS 81  ?BILITOT 1.0  ?PROT 6.7  ?A

## 2021-08-19 NOTE — Progress Notes (Addendum)
ANTICOAGULATION CONSULT NOTE ? ?Pharmacy Consult for heparin  ?Indication: chest pain/ACS ? ?Allergies  ?Allergen Reactions  ? Septra Ds [Sulfamethoxazole-Trimethoprim]   ? Lisinopril Other (See Comments)  ?  Cough  ? ? ?Patient Measurements: ?Weight: 48.6 kg (107 lb 2.3 oz) ? ? ?Vital Signs: ?Temp: 98.2 ?F (36.8 ?C) (03/31 1122) ?Temp Source: Oral (03/31 3419) ?BP: 156/55 (03/31 1122) ?Pulse Rate: 62 (03/31 1122) ? ?Labs: ?Recent Labs  ?  08/17/21 ?1842 08/17/21 ?1844 08/18/21 ?6222 08/19/21 ?9798 08/19/21 ?1333  ?HGB 14.3 15.6* 12.9 11.0*  --   ?HCT 45.2 46.0 38.7 33.3*  --   ?PLT 208  --  143* 100*  --   ?APTT 40*  --   --  122* 81*  ?LABPROT 30.3*  --   --   --   --   ?INR 2.9*  --   --   --   --   ?HEPARINUNFRC  --   --   --  >1.10*  --   ?CREATININE 1.22* 1.10* 0.95 1.04*  --   ? ? ? ?Estimated Creatinine Clearance: 27.6 mL/min (A) (by C-G formula based on SCr of 1.04 mg/dL (H)). ? 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 Plans are for hip surgery and pharmacy consultef to dose heparin.  ? ?APTT resulted therapeutic at 81 seconds. No issues with line or bleeding reported per RN. ? ?Goal of Therapy:  ?Heparin level 0.3-0.5 units/ml ?aPTT 66-85 seconds ?Monitor platelets by anticoagulation protocol: Yes ?  ?Plan:  ?Continue heparin at 450 units/hr ?Will f/u 8 hr aPTT post heparin restart ? ?Dani Anastasovites BS ?Pharmacy Student ?08/19/2021 2:12 PM ? ?I agree with the above note. ? ?Charley Miske BS, PharmD, BCPS ?Clinical Pharmacist ?08/19/2021 3:06 PM ? ? ?  ? ? ? ?

## 2021-08-19 NOTE — Progress Notes (Signed)
ANTICOAGULATION CONSULT NOTE ? ?Pharmacy Consult for heparin  ?Indication: chest pain/ACS ? ?Allergies  ?Allergen Reactions  ? Septra Ds [Sulfamethoxazole-Trimethoprim]   ? Lisinopril Other (See Comments)  ?  Cough  ? ? ?Patient Measurements: ?Weight: 48.6 kg (107 lb 2.3 oz) ? ? ?Vital Signs: ?Temp: 98.2 ?F (36.8 ?C) (03/30 2345) ?Temp Source: Oral (03/30 2345) ?BP: 170/73 (03/30 2345) ?Pulse Rate: 62 (03/30 2345) ? ?Labs: ?Recent Labs  ?  08/17/21 ?1842 08/17/21 ?1844 08/18/21 ?1194 08/19/21 ?1740  ?HGB 14.3 15.6* 12.9 11.0*  ?HCT 45.2 46.0 38.7 33.3*  ?PLT 208  --  143* 100*  ?APTT 40*  --   --  122*  ?LABPROT 30.3*  --   --   --   ?INR 2.9*  --   --   --   ?HEPARINUNFRC  --   --   --  >1.10*  ?CREATININE 1.22* 1.10* 0.95 1.04*  ? ? ? ?Estimated Creatinine Clearance: 27.6 mL/min (A) (by C-G formula based on SCr of 1.04 mg/dL (H)). ? 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 Plans are for hip surgery and pharmacy consultef to dose heparin.  ? ?Heparin level >1.1 (affected by Xarelto), aPTT supratherapeutic (122 sec) on infusion at 600 units/hr. No issues with line or bleeding reported per RN. ? ?Goal of Therapy:  ?Heparin level 0.3-0.5 units/ml ?aPTT 66-85 seconds ?Monitor platelets by anticoagulation protocol: Yes ?  ?Plan:  ?Hold heparin x 30 min ?Restart heparin at 450 units/hr ?Will f/u 8 hr aPTT post heparin restart ? ?Sherlon Handing, PharmD, BCPS ?Please see amion for complete clinical pharmacist phone list ?08/19/2021 5:00 AM ? ? ? ?

## 2021-08-19 NOTE — Progress Notes (Signed)
SLP Cancellation Note ? ?Patient Details ?Name: [name] Small ?MRN: [id number] ?DOB: 1929/03/25 ? ? ?Cancelled treatment:        Attempted to see this morning however MD with pt and this therapist thought her surgery was today. Surgery is planned for tomorrow.  ? ? ?Houston Siren ?08/19/2021, 3:20 PM ?

## 2021-08-19 NOTE — Consult Note (Signed)
Reason for Consult:Right hip fx ?Referring Physician: Eric [place] ?Time called: 1357 ?Time at bedside: 1453 ? ? ?[name] Small is an 88 y.o. female.  ?HPI: [name] Small fell at home on 3/29. She does not really remember the circumstances of her fall. She was found down by family and brought to the ED where she c/o right hip pain. X-rays showed a right hip fx and orthopedic surgery was consulted. She lives at home alone (son lives next door) and does not use any assistive devices to ambulate. ? ?Past Medical History:  ?Diagnosis Date  ? Atrial fibrillation ([place])   ? Bleeding stomach ulcer 1980s?  ? Cancer ([place]) Meloanoma left leg  ? DIVERTICULOSIS, COLON 11/22/2006  ? Headache(784.0)   ? "very often; not regular" (11/25/2015)  ? History of blood transfusion 1980s  ? "when I had the bleeding ulcer"  ? HYPERLIPIDEMIA 11/22/2006  ? Hypertension   ? Menopausal syndrome (hot flashes)   ? OSTEOPOROSIS 11/22/2006  ? Presence of permanent cardiac pacemaker   ? ? ?Past Surgical History:  ?Procedure Laterality Date  ? APPENDECTOMY    ? BUNIONECTOMY Right   ? CATARACT EXTRACTION W/ INTRAOCULAR LENS  IMPLANT, BILATERAL Bilateral 2000s  ? CHOLECYSTECTOMY OPEN    ? EP IMPLANTABLE DEVICE N/A 11/25/2015  ? Procedure: Pacemaker Implant;  Surgeon: Will Meredith Leeds, MD;  Location: [facility];  Service: Cardiovascular;  Laterality: N/A;  ? FEMUR IM NAIL Left 07/08/2014  ? Procedure: INTRAMEDULLARY (IM) NAIL ;  Surgeon: Meredith Pel, MD;  Location: [facility];  Service: Orthopedics;  Laterality: Left;  ? FRACTURE SURGERY    ? IR KYPHO LUMBAR INC FX REDUCE BONE BX UNI/BIL CANNULATION INC/IMAGING  03/21/2021  ? REVISION TOTAL HIP ARTHROPLASTY Left 06/2014  ? ? ?Family History  ?Problem Relation Age of Onset  ? Alzheimer's disease Sister   ? Cardiomyopathy Brother   ? ? ?Social History:  reports that she has never smoked. She has never been exposed to tobacco smoke. She has never used smokeless tobacco. She reports that she does not drink alcohol  and does not use drugs. ? ?Allergies:  ?Allergies  ?Allergen Reactions  ? Septra Ds [Sulfamethoxazole-Trimethoprim]   ? Lisinopril Other (See Comments)  ?  Cough  ? ? ?Medications: I have reviewed the patient's current medications. ? ?Results for orders placed or performed during the hospital encounter of 08/17/21 (from the past 48 hour(s))  ?CBG monitoring, ED     Status: Abnormal  ? Collection Time: 08/17/21  6:37 PM  ?Result Value Ref Range  ? Glucose-Capillary 139 (H) 70 - 99 mg/dL  ?  Comment: Glucose reference range applies only to samples taken after fasting for at least 8 hours.  ?Ethanol     Status: None  ? Collection Time: 08/17/21  6:42 PM  ?Result Value Ref Range  ? Alcohol, Ethyl (B) <10 <10 mg/dL  ?  Comment: (NOTE) ?Lowest detectable limit for serum alcohol is 10 mg/dL. ? ?For medical purposes only. ?Performed at [facility], [place] [address], [place], [place] ?[address] ?  ?Protime-INR     Status: Abnormal  ? Collection Time: 08/17/21  6:42 PM  ?Result Value Ref Range  ? Prothrombin Time 30.3 (H) 11.4 - 15.2 seconds  ? INR 2.9 (H) 0.8 - 1.2  ?  Comment: (NOTE) ?INR goal varies based on device and disease states. ?Performed at [facility], [place] [address], [place], [place] ?[address] ?  ?APTT  Status: Abnormal  ? Collection Time: 08/17/21  6:42 PM  ?Result Value Ref Range  ? aPTT 40 (H) 24 - 36 seconds  ?  Comment:        ?IF BASELINE aPTT IS ELEVATED, ?SUGGEST PATIENT RISK ASSESSMENT ?BE USED TO DETERMINE APPROPRIATE ?ANTICOAGULANT THERAPY. ?Performed at [facility], [place] [address], [place], [place] [address] ?  ?CBC     Status: Abnormal  ? Collection Time: 08/17/21  6:42 PM  ?Result Value Ref Range  ? WBC 19.6 (H) 4.0 - 10.5 K/uL  ? RBC 4.77 3.87 - 5.11 MIL/uL  ? Hemoglobin 14.3 12.0 - 15.0 g/dL  ? HCT 45.2 36.0 - 46.0 %  ? MCV 94.8 80.0 - 100.0 fL  ? MCH 30.0 26.0 - 34.0 pg  ? MCHC 31.6 30.0 - 36.0 g/dL  ? RDW 14.1 11.5 - 15.5 %  ? Platelets 208 150 - 400 K/uL  ? nRBC 0.0 0.0 -  0.2 %  ?  Comment: Performed at [facility], [place] [address], [place], [place] [address]  ?Differential     Status: Abnormal  ? Collection Time: 08/17/21  6:42 PM  ?Result Value Ref Range  ? Neutrophils Relative % 86 %  ? Neutro Abs 16.9 (H) 1.7 - 7.7 K/uL  ? Lymphocytes Relative 7 %  ? Lymphs Abs 1.5 0.7 - 4.0 K/uL  ? Monocytes Relative 5 %  ? Monocytes Absolute 0.9 0.1 - 1.0 K/uL  ? Eosinophils Relative 1 %  ? Eosinophils Absolute 0.1 0.0 - 0.5 K/uL  ? Basophils Relative 0 %  ? Basophils Absolute 0.0 0.0 - 0.1 K/uL  ? Immature Granulocytes 1 %  ? Abs Immature Granulocytes 0.19 (H) 0.00 - 0.07 K/uL  ?  Comment: Performed at [facility], [place] [address] [place], [place] [address]  ?Comprehensive metabolic panel     Status: Abnormal  ? Collection Time: 08/17/21  6:42 PM  ?Result Value Ref Range  ? Sodium 137 135 - 145 mmol/L  ? Potassium 3.8 3.5 - 5.1 mmol/L  ? Chloride 103 98 - 111 mmol/L  ? CO2 25 22 - 32 mmol/L  ? Glucose, Bld 144 (H) 70 - 99 mg/dL  ?  Comment: Glucose reference range applies only to samples taken after fasting for at least 8 hours.  ? BUN 15 8 - 23 mg/dL  ? Creatinine, Ser 1.22 (H) 0.44 - 1.00 mg/dL  ? Calcium 9.1 8.9 - 10.3 mg/dL  ? Total Protein 6.7 6.5 - 8.1 g/dL  ? Albumin 3.8 3.5 - 5.0 g/dL  ? AST 37 15 - 41 U/L  ? ALT 35 0 - 44 U/L  ? Alkaline Phosphatase 81 38 - 126 U/L  ? Total Bilirubin 1.0 0.3 - 1.2 mg/dL  ? GFR, Estimated 42 (L) >60 mL/min  ?  Comment: (NOTE) ?Calculated using the CKD-EPI Creatinine Equation (2021) ?  ? Anion gap 9 5 - 15  ?  Comment: Performed at [facility], [place] [address], [place], [place] [address]  ?LDL cholesterol, direct     Status: None  ? Collection Time: 08/17/21  6:42 PM  ?Result Value Ref Range  ? Direct LDL 64.0 0 - 99 mg/dL  ?  Comment: Performed at [facility], [place] [address] [place], [place] [address]  ?Hemoglobin A1c     Status: Abnormal  ? Collection Time: 08/17/21  6:42 PM  ?Result Value Ref Range  ? Hgb A1c MFr  Bld 5.7  (H) 4.8 - 5.6 %  ?  Comment: (NOTE) ?Pre diabetes:          5.7%-6.4% ? ?Diabetes:              >6.4% ? ?Glycemic control for   <7.0% ?adults with diabetes ?  ? Mean Plasma Glucose 116.89 mg/dL  ?  Comment: Performed at [facility], [place] [address] [place], [place] [address]  ?I-stat chem 8, ED     Status: Abnormal  ? Collection Time: 08/17/21  6:44 PM  ?Result Value Ref Range  ? Sodium 138 135 - 145 mmol/L  ? Potassium 3.8 3.5 - 5.1 mmol/L  ? Chloride 101 98 - 111 mmol/L  ? BUN 19 8 - 23 mg/dL  ? Creatinine, Ser 1.10 (H) 0.44 - 1.00 mg/dL  ? Glucose, Bld 139 (H) 70 - 99 mg/dL  ?  Comment: Glucose reference range applies only to samples taken after fasting for at least 8 hours.  ? Calcium, Ion 1.12 (L) 1.15 - 1.40 mmol/L  ? TCO2 28 22 - 32 mmol/L  ? Hemoglobin 15.6 (H) 12.0 - 15.0 g/dL  ? HCT 46.0 36.0 - 46.0 %  ?CBC     Status: Abnormal  ? Collection Time: 08/18/21  3:53 AM  ?Result Value Ref Range  ? WBC 23.5 (H) 4.0 - 10.5 K/uL  ? RBC 4.22 3.87 - 5.11 MIL/uL  ? Hemoglobin 12.9 12.0 - 15.0 g/dL  ? HCT 38.7 36.0 - 46.0 %  ? MCV 91.7 80.0 - 100.0 fL  ? MCH 30.6 26.0 - 34.0 pg  ? MCHC 33.3 30.0 - 36.0 g/dL  ? RDW 14.2 11.5 - 15.5 %  ? Platelets 143 (L) 150 - 400 K/uL  ? nRBC 0.0 0.0 - 0.2 %  ?  Comment: Performed at [facility], [place] [address] [place], [place] [address]  ?Basic metabolic panel     Status: Abnormal  ? Collection Time: 08/18/21  3:53 AM  ?Result Value Ref Range  ? Sodium 140 135 - 145 mmol/L  ? Potassium 3.9 3.5 - 5.1 mmol/L  ? Chloride 107 98 - 111 mmol/L  ? CO2 24 22 - 32 mmol/L  ? Glucose, Bld 148 (H) 70 - 99 mg/dL  ?  Comment: Glucose reference range applies only to samples taken after fasting for at least 8 hours.  ? BUN 13 8 - 23 mg/dL  ? Creatinine, Ser 0.95 0.44 - 1.00 mg/dL  ? Calcium 8.6 (L) 8.9 - 10.3 mg/dL  ? GFR, Estimated 57 (L) >60 mL/min  ?  Comment: (NOTE) ?Calculated using the CKD-EPI Creatinine Equation (2021) ?  ? Anion gap 9 5 - 15  ?  Comment: Performed at [facility], [place] [address], [place], [place] [address]  ?Urine Culture     Status: Abnormal  ? Collection Time: 08/18/21  7:08 AM  ? Specimen: Urine, Catheterized  ?Result Value Ref Range  ? Specimen [place] Must

## 2021-08-19 NOTE — Care Management Important Message (Signed)
Important Message ? ?Patient Details  ?Name: [name] Small ?MRN: [id number] ?Date of Birth: 13-Nov-1928 ? ? ?Medicare Important Message Given:  Yes ? ? ? ? ?[name] ?08/19/2021, 12:15 PM ?

## 2021-08-19 NOTE — Progress Notes (Addendum)
STROKE TEAM PROGRESS NOTE  ? ?INTERVAL HISTORY ?Patient is seen in her room with her son and daughter in law at the bedside.  She continues to have right hip pain from known femoral neck fracture.  She has been placed on heparin over the weekend and will have a repair of this fracture on Monday.  She has been hemodynamically stable overnight with no acute events, and her neurological exam remains stable. ? ?Vitals:  ? 08/19/21 0445 08/19/21 0545 08/19/21 0834 08/19/21 1122  ?BP: (!) 144/50 (!) 144/78 140/61 (!) 156/55  ?Pulse: (!) 59 65 64 62  ?Resp: '14 15 16 '$ (!) 21  ?Temp: 98 ?F (36.7 ?C)  98.5 ?F (36.9 ?C) 98.2 ?F (36.8 ?C)  ?TempSrc:   Oral   ?SpO2: 100% 100% 100% 100%  ?Weight:      ? ?CBC:  ?Recent Labs  ?Lab 08/17/21 ?1842 08/17/21 ?1844 08/18/21 ?1829 08/19/21 ?9371  ?WBC 19.6*  --  23.5* 12.1*  ?NEUTROABS 16.9*  --   --   --   ?HGB 14.3   < > 12.9 11.0*  ?HCT 45.2   < > 38.7 33.3*  ?MCV 94.8  --  91.7 92.0  ?PLT 208  --  143* 100*  ? < > = values in this interval not displayed.  ? ? ?Basic Metabolic Panel:  ?Recent Labs  ?Lab 08/18/21 ?0353 08/19/21 ?6967  ?NA 140 138  ?K 3.9 4.0  ?CL 107 104  ?CO2 24 26  ?GLUCOSE 148* 112*  ?BUN 13 11  ?CREATININE 0.95 1.04*  ?CALCIUM 8.6* 8.3*  ?MG  --  2.0  ? ? ?Lipid Panel: No results for input(s): CHOL, TRIG, HDL, CHOLHDL, VLDL, LDLCALC in the last 168 hours. ?HgbA1c:  ?Recent Labs  ?Lab 08/17/21 ?1842  ?HGBA1C 5.7*  ? ? ?Urine Drug Screen: No results for input(s): LABOPIA, COCAINSCRNUR, LABBENZ, AMPHETMU, THCU, LABBARB in the last 168 hours.  ?Alcohol Level  ?Recent Labs  ?Lab 08/17/21 ?1842  ?ETH <10  ? ? ? ?IMAGING past 24 hours ?MR BRAIN WO CONTRAST ? ?Result Date: 08/18/2021 ?CLINICAL DATA:  Neuro deficit, acute, stroke suspected EXAM: MRI HEAD WITHOUT CONTRAST TECHNIQUE: Multiplanar, multiecho pulse sequences of the brain and surrounding structures were obtained without intravenous contrast. COMPARISON:  CT head August 17, 2021. FINDINGS: Moderately motion limited  study.  Within this limitation: Brain: Restricted diffusion along the posterior aspect of the remote left parietal infarct, compatible with acute on chronic infarct. Additional small focus of restricted diffusion in the left periventricular frontal lobe, also compatible with acute infarcts. No significant mass effect. No midline shift. Moderate scattered T2/FLAIR hyperintensities in the white matter, nonspecific but compatible with chronic microvascular ischemic disease. No evidence of acute hemorrhage, mass lesion, or extra-axial fluid collection. Moderate cerebral atrophy with ex vacuo ventricular dilation. No hydrocephalus. Vascular: Major arterial flow voids are maintained at the skull base. Skull and upper cervical spine: Normal marrow signal. Sinuses/Orbits: Clear sinuses.  No acute orbital findings. Other: No mastoid effusions. IMPRESSION: 1. Moderately motion limited study with evidence of acute left frontal and parietal infarcts. The left parietal infarct is along the posterior aspect of a remote left parietal infarct. 2. Moderate chronic microvascular ischemic disease. 3.  Cerebral atrophy (ICD10-G31.9). Electronically Signed   By: Margaretha Sheffield M.D.   On: 08/18/2021 14:42  ? ?ECHOCARDIOGRAM COMPLETE BUBBLE STUDY ? ?Result Date: 08/18/2021 ?   ECHOCARDIOGRAM REPORT   Patient Name:   [name] Small Date of Exam: 08/18/2021 Medical Rec #:  [id number]   Height:       65.0 in Accession #:    [id number]  Weight:       107.1 lb Date of Birth:  [birth year]    BSA:          1.517 m? Patient Age:    88 years    BP:           162/61 mmHg Patient Gender: F           HR:           64 bpm. Exam Location:  Inpatient Procedure: 2D Echo, Color Doppler and Cardiac Doppler Indications:    Stroke i63.9  History:        Patient has prior history of Echocardiogram examinations, most                 recent 12/08/2014. Pacemaker, Arrythmias:Atrial Fibrillation;                 Risk Factors:Hypertension and Dyslipidemia.  Sonographer:     Raquel Sarna Senior RDCS Referring Phys: [place]  1. Left ventricular ejection fraction, by estimation, is >75%. The left ventricle has hyperdynamic function. The left ventricle has no regional wall motion abnormalities. There is mild left ventricular hypertrophy. Left ventricular diastolic parameters are consistent with Grade I diastolic dysfunction (impaired relaxation). Elevated left atrial pressure.  2. Right ventricular systolic function is normal. The right ventricular size is normal. There is mildly elevated pulmonary artery systolic pressure.  3. The mitral valve is normal in structure. Trivial mitral valve regurgitation. No evidence of mitral stenosis.  4. The aortic valve is tricuspid. Aortic valve regurgitation is trivial. Aortic valve sclerosis/calcification is present, without any evidence of aortic stenosis.  5. The inferior vena cava is normal in size with greater than 50% respiratory variability, suggesting right atrial pressure of 3 mmHg.  6. Agitated saline contrast bubble study was negative, with no evidence of any interatrial shunt. FINDINGS  Left Ventricle: Left ventricular ejection fraction, by estimation, is >75%. The left ventricle has hyperdynamic function. The left ventricle has no regional wall motion abnormalities. The left ventricular internal cavity size was normal in size. There is mild left ventricular hypertrophy. Left ventricular diastolic parameters are consistent with Grade I diastolic dysfunction (impaired relaxation). Elevated left atrial pressure. Right Ventricle: The right ventricular size is normal. Right ventricular systolic function is normal. There is mildly elevated pulmonary artery systolic pressure. The tricuspid regurgitant velocity is 3.18 m/s, and with an assumed right atrial pressure of 3 mmHg, the estimated right ventricular systolic pressure is 10.0 mmHg. Left Atrium: Left atrial size was normal in size. Right Atrium: Right atrial size was  normal in size. Pericardium: There is no evidence of pericardial effusion. Mitral Valve: The mitral valve is normal in structure. Trivial mitral valve regurgitation. No evidence of mitral valve stenosis. Tricuspid Valve: The tricuspid valve is normal in structure. Tricuspid valve regurgitation is mild . No evidence of tricuspid stenosis. Aortic Valve: The aortic valve is tricuspid. Aortic valve regurgitation is trivial. Aortic valve sclerosis/calcification is present, without any evidence of aortic stenosis. Pulmonic Valve: The pulmonic valve was normal in structure. Pulmonic valve regurgitation is not visualized. No evidence of pulmonic stenosis. Aorta: The aortic root is normal in size and structure. Venous: The inferior vena cava is normal in size with greater than 50% respiratory variability, suggesting right atrial pressure of 3 mmHg. IAS/Shunts: No atrial level shunt detected by color flow Doppler. Agitated saline  contrast bubble study was negative, with no evidence of any interatrial shunt. Additional Comments: A device lead is visualized.  LEFT VENTRICLE PLAX 2D LVIDd:         3.30 cm   Diastology LVIDs:         1.50 cm   LV e' medial:    5.22 cm/s LV PW:         1.00 cm   LV E/e' medial:  15.0 LV IVS:        0.90 cm   LV e' lateral:   2.94 cm/s LVOT diam:     1.80 cm   LV E/e' lateral: 26.7 LV SV:         46 LV SV Index:   30 LVOT Area:     2.54 cm?  RIGHT VENTRICLE RV S prime:     12.30 cm/s TAPSE (M-mode): 2.9 cm LEFT ATRIUM             Index        RIGHT ATRIUM           Index LA diam:        1.70 cm 1.12 cm/m?   RA Area:     13.50 cm? LA Vol (A2C):   18.5 ml 12.19 ml/m?  RA Volume:   30.00 ml  19.77 ml/m? LA Vol (A4C):   12.4 ml 8.17 ml/m? LA Biplane Vol: 15.7 ml 10.35 ml/m?  AORTIC VALVE LVOT Vmax:   92.70 cm/s LVOT Vmean:  58.700 cm/s LVOT VTI:    0.181 m  AORTA Ao Root diam: 2.60 cm Ao Asc diam:  2.70 cm MITRAL VALVE               TRICUSPID VALVE MV Area (PHT): 3.31 cm?    TR Peak grad:   40.4 mmHg  MV Decel Time: 229 msec    TR Vmax:        318.00 cm/s MV E velocity: 78.40 cm/s MV A velocity: 77.60 cm/s  SHUNTS MV E/A ratio:  1.01        Systemic VTI:  0.18 m                            Systemic Dia

## 2021-08-20 ENCOUNTER — Encounter (HOSPITAL_COMMUNITY)
Admission: EM | Disposition: A | Discharge status: Skilled Nursing Facility | Payer: Self-pay | Source: Home / Self Care | Attending: Internal Medicine

## 2021-08-20 ENCOUNTER — Inpatient Hospital Stay (HOSPITAL_COMMUNITY): Payer: Medicare Other | Admitting: Anesthesiology

## 2021-08-20 ENCOUNTER — Encounter (HOSPITAL_COMMUNITY): Payer: Self-pay | Admitting: Internal Medicine

## 2021-08-20 ENCOUNTER — Inpatient Hospital Stay (HOSPITAL_COMMUNITY): Payer: Medicare Other

## 2021-08-20 DIAGNOSIS — E876 Hypokalemia: Secondary | ICD-10-CM

## 2021-08-20 DIAGNOSIS — I10 Essential (primary) hypertension: Secondary | ICD-10-CM

## 2021-08-20 DIAGNOSIS — K279 Peptic ulcer, site unspecified, unspecified as acute or chronic, without hemorrhage or perforation: Secondary | ICD-10-CM

## 2021-08-20 DIAGNOSIS — S72001A Fracture of unspecified part of neck of right femur, initial encounter for closed fracture: Secondary | ICD-10-CM

## 2021-08-20 DIAGNOSIS — D649 Anemia, unspecified: Secondary | ICD-10-CM

## 2021-08-20 HISTORY — PX: HIP ARTHROPLASTY: SHX981

## 2021-08-20 LAB — CBC
HCT: 28.6 % — ABNORMAL LOW (ref 36.0–46.0)
Hemoglobin: 9.4 g/dL — ABNORMAL LOW (ref 12.0–15.0)
MCH: 30.2 pg (ref 26.0–34.0)
MCHC: 32.9 g/dL (ref 30.0–36.0)
MCV: 92 fL (ref 80.0–100.0)
Platelets: 98 10*3/uL — ABNORMAL LOW (ref 150–400)
RBC: 3.11 MIL/uL — ABNORMAL LOW (ref 3.87–5.11)
RDW: 14.4 % (ref 11.5–15.5)
WBC: 10.4 10*3/uL (ref 4.0–10.5)
nRBC: 0 % (ref 0.0–0.2)

## 2021-08-20 LAB — BASIC METABOLIC PANEL
Anion gap: 7 (ref 5–15)
BUN: 12 mg/dL (ref 8–23)
CO2: 26 mmol/L (ref 22–32)
Calcium: 8.1 mg/dL — ABNORMAL LOW (ref 8.9–10.3)
Chloride: 106 mmol/L (ref 98–111)
Creatinine, Ser: 0.82 mg/dL (ref 0.44–1.00)
GFR, Estimated: >60 mL/min (ref >60)
Glucose, Bld: 112 mg/dL — ABNORMAL HIGH (ref 70–99)
Potassium: 3.4 mmol/L — ABNORMAL LOW (ref 3.5–5.1)
Sodium: 139 mmol/L (ref 135–145)

## 2021-08-20 LAB — SURGICAL PCR SCREEN
MRSA, PCR: NEGATIVE
Staphylococcus aureus: NEGATIVE

## 2021-08-20 LAB — HEPARIN LEVEL (UNFRACTIONATED): Heparin Unfractionated: 0.95 IU/mL — ABNORMAL HIGH (ref 0.30–0.70)

## 2021-08-20 SURGERY — HEMIARTHROPLASTY, HIP, DIRECT ANTERIOR APPROACH, FOR FRACTURE
Anesthesia: General | Site: Hip | Laterality: Right

## 2021-08-20 MED ORDER — DEXAMETHASONE SODIUM PHOSPHATE 10 MG/ML IJ SOLN
INTRAMUSCULAR | Status: DC | PRN
  Administered 2021-08-20: 8 mg via INTRAVENOUS

## 2021-08-20 MED ORDER — ACETAMINOPHEN 10 MG/ML IV SOLN: 750.0000 mg | Freq: Once | INTRAVENOUS | Status: DC | PRN

## 2021-08-20 MED ORDER — CALCIUM CITRATE 950 (200 CA) MG PO TABS
200.0000 mg | ORAL_TABLET | Freq: Two times a day (BID) | ORAL | Status: DC
  Administered 2021-08-20 – 2021-08-24 (×8): 200 mg via ORAL
  Filled 2021-08-20 (×9): qty 1

## 2021-08-20 MED ORDER — LIDOCAINE 2% (20 MG/ML) 5 ML SYRINGE
INTRAMUSCULAR | Status: DC | PRN
  Administered 2021-08-20: 50 mg via INTRAVENOUS

## 2021-08-20 MED ORDER — DIPHENHYDRAMINE HCL 50 MG/ML IJ SOLN
12.5000 mg | Freq: Four times a day (QID) | INTRAMUSCULAR | Status: DC | PRN
  Administered 2021-08-20: 12.5 mg via INTRAVENOUS
  Filled 2021-08-20: qty 1

## 2021-08-20 MED ORDER — SUGAMMADEX SODIUM 200 MG/2ML IV SOLN
INTRAVENOUS | Status: DC | PRN
  Administered 2021-08-20: 200 mg via INTRAVENOUS

## 2021-08-20 MED ORDER — FENTANYL CITRATE (PF) 250 MCG/5ML IJ SOLN
INTRAMUSCULAR | Status: DC | PRN
Start: 2021-08-20 — End: 2021-08-20
  Administered 2021-08-20 (×2): 50 ug via INTRAVENOUS
  Administered 2021-08-20: 100 ug via INTRAVENOUS

## 2021-08-20 MED ORDER — PROPOFOL 10 MG/ML IV BOLUS
INTRAVENOUS | Status: DC | PRN
  Administered 2021-08-20: 50 mg via INTRAVENOUS

## 2021-08-20 MED ORDER — POTASSIUM CHLORIDE CRYS ER 20 MEQ PO TBCR
40.0000 meq | EXTENDED_RELEASE_TABLET | Freq: Once | ORAL | Status: AC
  Administered 2021-08-20: 40 meq via ORAL
  Filled 2021-08-20: qty 2

## 2021-08-20 MED ORDER — CEFAZOLIN SODIUM-DEXTROSE 2-4 GM/100ML-% IV SOLN
2.0000 g | Freq: Two times a day (BID) | INTRAVENOUS | Status: AC
  Administered 2021-08-20 – 2021-08-21 (×2): 2 g via INTRAVENOUS
  Filled 2021-08-20 (×3): qty 100

## 2021-08-20 MED ORDER — AMISULPRIDE (ANTIEMETIC) 5 MG/2ML IV SOLN: 10.0000 mg | Freq: Once | INTRAVENOUS | Status: DC | PRN

## 2021-08-20 MED ORDER — LACTATED RINGERS IV SOLN: INTRAVENOUS | Status: DC

## 2021-08-20 MED ORDER — PHENYLEPHRINE HCL-NACL 20-0.9 MG/250ML-% IV SOLN
INTRAVENOUS | Status: DC | PRN
Start: 2021-08-20 — End: 2021-08-20
  Administered 2021-08-20: 50 ug/min via INTRAVENOUS

## 2021-08-20 MED ORDER — ROCURONIUM BROMIDE 10 MG/ML (PF) SYRINGE
PREFILLED_SYRINGE | INTRAVENOUS | Status: DC | PRN
  Administered 2021-08-20 (×2): 30 mg via INTRAVENOUS

## 2021-08-20 MED ORDER — FENTANYL CITRATE (PF) 100 MCG/2ML IJ SOLN: 25.0000 ug | INTRAMUSCULAR | Status: DC | PRN

## 2021-08-20 MED ORDER — ONDANSETRON HCL 4 MG/2ML IJ SOLN
INTRAMUSCULAR | Status: DC | PRN
  Administered 2021-08-20: 4 mg via INTRAVENOUS

## 2021-08-20 MED ORDER — RIVAROXABAN 15 MG PO TABS
15.0000 mg | ORAL_TABLET | Freq: Every day | ORAL | Status: DC
Start: 2021-08-20 — End: 2021-08-24
  Administered 2021-08-20 – 2021-08-24 (×5): 15 mg via ORAL
  Filled 2021-08-20 (×5): qty 1

## 2021-08-20 MED ORDER — ONDANSETRON HCL 4 MG/2ML IJ SOLN: 4.0000 mg | Freq: Once | INTRAMUSCULAR | Status: DC | PRN

## 2021-08-20 MED ORDER — METOPROLOL TARTRATE 25 MG PO TABS
37.5000 mg | ORAL_TABLET | Freq: Two times a day (BID) | ORAL | Status: DC
  Administered 2021-08-20 – 2021-08-24 (×9): 37.5 mg via ORAL
  Filled 2021-08-20 (×9): qty 1

## 2021-08-20 MED ORDER — ASCORBIC ACID 500 MG PO TABS
500.0000 mg | ORAL_TABLET | Freq: Every day | ORAL | Status: DC
  Administered 2021-08-20 – 2021-08-24 (×5): 500 mg via ORAL
  Filled 2021-08-20 (×5): qty 1

## 2021-08-20 MED ORDER — CEFAZOLIN SODIUM-DEXTROSE 2-3 GM-%(50ML) IV SOLR
INTRAVENOUS | Status: DC | PRN
  Administered 2021-08-20: 2 g via INTRAVENOUS

## 2021-08-20 MED ORDER — 0.9 % SODIUM CHLORIDE (POUR BTL) OPTIME
TOPICAL | Status: DC | PRN
  Administered 2021-08-20: 1000 mL

## 2021-08-20 MED ORDER — SODIUM CHLORIDE 0.9 % IR SOLN
Status: DC | PRN
  Administered 2021-08-20: 3000 mL

## 2021-08-20 MED ORDER — CHLORHEXIDINE GLUCONATE 0.12 % MT SOLN
15.0000 mL | Freq: Once | OROMUCOSAL | Status: AC
  Administered 2021-08-20: 15 mL via OROMUCOSAL
  Filled 2021-08-20: qty 15

## 2021-08-20 MED ORDER — VITAMIN D 25 MCG (1000 UNIT) PO TABS
2000.0000 [IU] | ORAL_TABLET | Freq: Two times a day (BID) | ORAL | Status: DC
  Administered 2021-08-20 – 2021-08-24 (×8): 2000 [IU] via ORAL
  Filled 2021-08-20 (×8): qty 2

## 2021-08-20 MED ORDER — ORAL CARE MOUTH RINSE: 15.0000 mL | Freq: Once | OROMUCOSAL | Status: AC

## 2021-08-20 MED ORDER — PHENYLEPHRINE 40 MCG/ML (10ML) SYRINGE FOR IV PUSH (FOR BLOOD PRESSURE SUPPORT)
PREFILLED_SYRINGE | INTRAVENOUS | Status: DC | PRN
  Administered 2021-08-20 (×3): 80 ug via INTRAVENOUS

## 2021-08-20 SURGICAL SUPPLY — 53 items
BAG COUNTER SPONGE SURGICOUNT (BAG) ×2 IMPLANT
BAG SPNG CNTER NS LX DISP (BAG)
BLADE SAW SAG 73X25 THK (BLADE) ×1
BLADE SAW SGTL 73X25 THK (BLADE) ×2 IMPLANT
BRUSH SCRUB EZ PLAIN DRY (MISCELLANEOUS) ×5 IMPLANT
COVER SURGICAL LIGHT HANDLE (MISCELLANEOUS) ×3 IMPLANT
DRAPE INCISE IOBAN 85X60 (DRAPES) ×3 IMPLANT
DRAPE ORTHO SPLIT 77X108 STRL (DRAPES) ×4
DRAPE SURG ORHT 6 SPLT 77X108 (DRAPES) ×4 IMPLANT
DRAPE U-SHAPE 47X51 STRL (DRAPES) ×3 IMPLANT
DRSG MEPILEX BORDER 4X8 (GAUZE/BANDAGES/DRESSINGS) ×3 IMPLANT
ELECT BLADE 6.5 EXT (BLADE) IMPLANT
ELECT CAUTERY BLADE 6.4 (BLADE) IMPLANT
ELECT REM PT RETURN 9FT ADLT (ELECTROSURGICAL) ×2
ELECTRODE REM PT RTRN 9FT ADLT (ELECTROSURGICAL) ×2 IMPLANT
GLOVE SRG 8 PF TXTR STRL LF DI (GLOVE) ×4 IMPLANT
GLOVE SURG ENC MOIS LTX SZ8 (GLOVE) ×3 IMPLANT
GLOVE SURG ORTHO LTX SZ7.5 (GLOVE) ×6 IMPLANT
GLOVE SURG UNDER POLY LF SZ8 (GLOVE) ×4
GOWN STRL REUS W/ TWL LRG LVL3 (GOWN DISPOSABLE) ×2 IMPLANT
GOWN STRL REUS W/ TWL XL LVL3 (GOWN DISPOSABLE) ×2 IMPLANT
GOWN STRL REUS W/TWL LRG LVL3 (GOWN DISPOSABLE) ×2
GOWN STRL REUS W/TWL XL LVL3 (GOWN DISPOSABLE) ×2
HANDPIECE INTERPULSE COAX TIP (DISPOSABLE) ×2
HEAD FEM UNIPOLAR 46 OD STRL (Hips) ×1 IMPLANT
KIT BASIN OR (CUSTOM PROCEDURE TRAY) ×3 IMPLANT
KIT TURNOVER KIT B (KITS) ×3 IMPLANT
MANIFOLD NEPTUNE II (INSTRUMENTS) ×3 IMPLANT
NDL 1/2 CIR MAYO (NEEDLE) IMPLANT
NEEDLE 1/2 CIR MAYO (NEEDLE) IMPLANT
NS IRRIG 1000ML POUR BTL (IV SOLUTION) ×3 IMPLANT
PACK TOTAL JOINT (CUSTOM PROCEDURE TRAY) ×3 IMPLANT
PAD ARMBOARD 7.5X6 YLW CONV (MISCELLANEOUS) ×6 IMPLANT
PILLOW ABDUCTION MEDIUM (MISCELLANEOUS) ×1 IMPLANT
RETRIEVER SUT HEWSON (MISCELLANEOUS) ×4 IMPLANT
SET HNDPC FAN SPRY TIP SCT (DISPOSABLE) IMPLANT
SPACER DEPUY (Hips) ×1 IMPLANT
STAPLER VISISTAT 35W (STAPLE) ×3 IMPLANT
STEM SUMMIT PRESSFIT SZ 6 (Hips) ×1 IMPLANT
SUT ETHILON 2 0 PSLX (SUTURE) ×7 IMPLANT
SUT FIBERWIRE #2 38 T-5 BLUE (SUTURE) ×4
SUT VIC AB 0 CT1 27 (SUTURE) ×2
SUT VIC AB 0 CT1 27XBRD ANBCTR (SUTURE) IMPLANT
SUT VIC AB 1 CT1 18XCR BRD 8 (SUTURE) ×2 IMPLANT
SUT VIC AB 1 CT1 27 (SUTURE) ×6
SUT VIC AB 1 CT1 27XBRD ANBCTR (SUTURE) ×4 IMPLANT
SUT VIC AB 1 CT1 8-18 (SUTURE) ×2
SUT VIC AB 2-0 CT1 27 (SUTURE) ×4
SUT VIC AB 2-0 CT1 TAPERPNT 27 (SUTURE) ×4 IMPLANT
SUTURE FIBERWR #2 38 T-5 BLUE (SUTURE) ×4 IMPLANT
TOWEL GREEN STERILE (TOWEL DISPOSABLE) ×3 IMPLANT
TOWEL GREEN STERILE FF (TOWEL DISPOSABLE) ×3 IMPLANT
WATER STERILE IRR 1000ML POUR (IV SOLUTION) ×4 IMPLANT

## 2021-08-20 NOTE — Assessment & Plan Note (Addendum)
-  Potassium 3.8 this morning. ?-Continue to Monitor and Replete as Necessary ?-Repeat CMP within 1 week  ?

## 2021-08-20 NOTE — Progress Notes (Signed)
Patient continues with rash to back ?

## 2021-08-20 NOTE — Anesthesia Procedure Notes (Signed)
Procedure Name: Intubation ?Date/Time: 08/20/2021 8:46 AM ?Performed by: Inda Coke, CRNA ?Pre-anesthesia Checklist: Patient identified, Emergency Drugs available, Suction available and Patient being monitored ?Patient Re-evaluated:Patient Re-evaluated prior to induction ?Oxygen Delivery Method: Circle System Utilized ?Preoxygenation: Pre-oxygenation with 100% oxygen ?Induction Type: IV induction ?Ventilation: Mask ventilation without difficulty ?Laryngoscope Size: Mac and 3 ?Grade View: Grade II ?Tube type: Oral ?Tube size: 7.0 mm ?Number of attempts: 1 ?Airway Equipment and Method: Stylet and Oral airway ?Placement Confirmation: ETT inserted through vocal cords under direct vision, positive ETCO2 and breath sounds checked- equal and bilateral ?Secured at: 22 cm ?Tube secured with: Tape ?Dental Injury: Teeth and Oropharynx as per pre-operative assessment  ? ? ? ? ?

## 2021-08-20 NOTE — Progress Notes (Signed)
Pt returned to room 3W38 via bed after surgery. Received report from [place], RN in PACU. See assessment. Will continue to monitor.  ?

## 2021-08-20 NOTE — Progress Notes (Signed)
?PROGRESS NOTE ? ? ? ?[name] Small  JFH:[id number] DOB: [birth year] DOA: 08/17/2021 ?PCP: Billie Ruddy, MD  ? ? ?Brief Narrative:  ?[name] Small is a 88 year old female with past medical history significant for chronic atrial fibrillation on Xarelto, sick sinus syndrome s/p PPM, CKD stage IIIa, essential hypertension, HLD, GERD, vertebral compression fracture s/p vertebroplasty 02/2021 who presented to [facility] ED on 3/29 via EMS as a code stroke for evaluation of slurred speech, facial droop, left-sided weakness.  Last known normal at 1503/29 and was found down on the floor by family at 42.  Patient complained of right hip pain, unable to ambulate.  Patient was confused on ED presentation and stated that she was walking using her walker and fell; but is unsure how this occurred.  Reporting pain in her right hip.  Also reports dysuria.  Denies chest pain or shortness of breath.  Son states he was with the patient and she appeared normal total 1500; then he went out to get food and when he returned at 1530, she was on the floor and appeared confused with slurred speech, right leg pain and facial droop.  She was unable to get up from the floor. ? ?Patient was recently admitted 08/10/2021 through 08/12/2021 for acute metabolic encephalopathy secondary to UTI treated with vancomycin and Rocephin.  Patient was transition to nitrofurantoin based on prior cultures with plan outpatient urology follow-up due to recurrent UTIs. ? ?In the ED, temperature 98.0 ?F, HR 72, RR 16, BP 153/76, SPO2 93% on room air.  Sodium 137, potassium 3.8, chloride 103, CO2 25, glucose 144, BUN 15, creatinine 1.22, AST 37, ALT 35.  WBC 19.6, hemoglobin 14.3, platelets 208.  INR 2.9.  TSH 1.864.  EtOH level less than 10.  Chest x-ray with no acute cardiopulmonary disease process.  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  CTA head/neck with decreased  perfusion posterior left MCA, moderate narrowing distal left M1 segment, multifocal narrowing bilateral PCAs, 50% proximal left ICA stenosis, unchanged.  X-ray right hip/pelvis with acute displaced right femoral neck fracture.  Neurology and orthopedics were consulted.  Patient was not a tPA candidate due to chronic anticoagulation on Xarelto.  Hospital service consulted for further evaluation and management of hip fracture and concern for acute CVA.  ? ? ?Assessment and Plan: ?* Acute CVA (cerebrovascular accident) ([place]) ?Patient presented to the ED with acute onset slurred speech, facial droop, and left-sided weakness.  Last known well at 1500 on 08/17/2021. tPA not administered as she is chronically anticoagulated. 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 CT angio head and neck decreased perfusion posterior left MCA, mild to moderate narrowing in the distal left M1 segment, no additional focal stenosis or occlusion is seen in the left MCA branches, multifocal narrowing in the bilateral PCAs. No other ?hemodynamically significant intracranial stenosis, 50% proximal left ICA stenosis, unchanged, no additional hemodynamically significant stenosis in the neck.  LDL 67.  Hemoglobin A1c 5.7. MR brain with acute left frontal and parietal infarcts.  TTE with LVEF greater than 75%, LV with no regional wall motion normalities, mild LVH, grade 1 diastolic dysfunction, trivial MR, no aortic stenosis, IVC normal in size, no evidence of intra-atrial shunt. ?--Neurology following, appreciate assistance ?--Aspirin 81 mg p.o. daily ?--Transition heparin drip back to home Xarelto today ?--Monitor on telemetry ?--Seen by SLP ?--PT/OT evaluation tomorrow ?--Anticipate likely need of SNF  placement ? ?Hip fracture ([place]) ?Patient with unwitnessed fall at home with x-ray notable for acute displaced right femoral neck fracture.  Underwent right hemiarthroplasty on 08/20/2021 by Dr. Marcelino Scot ?--Orthopedics  following, appreciate assistance ?--Resume home Xarelto today ?--Oxycodone 5 mg p.o. q6h PRN moderate pain ?--Morphine 1 mg IV every 4 hours as needed severe pain ?--Robaxin '500mg'$  PO q6h PRN muscle spasm ?--WBAT with posterior hip precautions ?--PT/OT eval ? ? ?Essential hypertension ?Home medications include metoprolol tartrate 37.5 mg p.o. twice daily ?--Resume home metoprolol today ? ?Atrial fibrillation, chronic ([place]) ?--Amiodarone 200 mg p.o. daily ?--metoprolol tartrate 37.5 mg p.o. twice daily  ?--Resume Xarelto today ?--Monitor on telemetry ? ?Stage 3a chronic kidney disease (CKD) ([place]) ?Creatinine 1.2 on admission with baseline 0.8-1.0.  Was given a 500 mL IV fluid bolus in ED. ?--Cr 1.2>0.95>1.04>0.82 ?--Avoid nephrotoxins, renal dose all medications ?--BMP daily ? ?Leukocytosis ?Patient is afebrile, but history of recurrent UTIs.  Unsure if this is related to recurrent UTI or stress reaction from fracture ?--WBC 19.6>23.5>12.1>10.4 ?--Urine culture with insignificant growth ?--ceftriaxone 1 g IV q24h; plan 5-day course ?--CBC daily ? ?Sick sinus syndrome ([place]) ?Status post PPM ?--Monitor on telemetry ? ? ? ?DVT prophylaxis: SCDs Start: 08/17/21 2227 ?Rivaroxaban (XARELTO) tablet 15 mg  ?  Code Status: DNR ?Family Communication: No family present at bedside this morning, updated son Elenore Rota via telephone this afternoon ? ?Disposition Plan:  ?Level of care: Telemetry Medical ?Status is: Inpatient ?Remains inpatient appropriate because: Awaiting PT/OT evaluation, anticipate likely need of SNF placement ? ?Consultants:  ?Neurology ?Orthopedics ? ?Procedures:  ?TTE:  ?Right hip hemiarthroplasty 4/1, Dr. Marcelino Scot ? ?Antimicrobials:  ?Ceftriaxone 3/30>> ? ? ?Subjective: ?Patient seen examined bedside, resting comfortably.  Just returned from the OR following right hip hemiarthroplasty.  No family present this morning.  Pain currently controlled.  No other complaints or concerns at this time.  Denies headache, no  chest pain, no palpitations, no shortness of breath, no abdominal pain.  No acute events overnight per nursing staff. ? ?Objective: ?Vitals:  ? 08/20/21 1120 08/20/21 1135 08/20/21 1150 08/20/21 1230  ?BP: 131/63 (!) 153/54 (!) 148/59 (!) 156/55  ?Pulse: 73 67 68 65  ?Resp: '17 14 17 16  '$ ?Temp:  98.5 ?F (36.9 ?C) 98.5 ?F (36.9 ?C) 98.5 ?F (36.9 ?C)  ?TempSrc:    Oral  ?SpO2: 93% 98% 99% 99%  ?Weight:      ?Height:      ? ? ?Intake/Output Summary (Last 24 hours) at 08/20/2021 1321 ?Last data filed at 08/20/2021 1048 ?Gross per 24 hour  ?Intake 1400 ml  ?Output 300 ml  ?Net 1100 ml  ? ?Filed Weights  ? 08/17/21 1800 08/20/21 0711  ?Weight: 48.6 kg 48.6 kg  ? ? ?Examination: ? ?Physical Exam: ?GEN: NAD, alert and oriented to place/time, but not person, elderly in appearance ?HEENT: NCAT, PERRL, EOMI, sclera clear, MMM ?PULM: CTAB w/o wheezes/crackles, normal respiratory effort, on room air ?CV: RRR w/o M/G/R ?GI: abd soft, NTND, NABS, no R/G/M ?MSK: no peripheral edema, right lower extremity with surgical dressing in place, clean/dry/intact ?NEURO: Left-sided weakness noted, fluent speech and no obvious facial droop ?PSYCH: normal mood/affect ?Integumentary: dry/intact, no rashes or wounds ? ? ? ?Data Reviewed: I have personally reviewed following labs and imaging studies ? ?CBC: ?Recent Labs  ?Lab 08/17/21 ?1842 08/17/21 ?1844 08/18/21 ?2010 08/19/21 ?0712 08/20/21 ?0200  ?WBC 19.6*  --  23.5* 12.1* 10.4  ?NEUTROABS 16.9*  --   --   --   --   ?  HGB 14.3 15.6* 12.9 11.0* 9.4*  ?HCT 45.2 46.0 38.7 33.3* 28.6*  ?MCV 94.8  --  91.7 92.0 92.0  ?PLT 208  --  143* 100* 98*  ? ?Basic Metabolic Panel: ?Recent Labs  ?Lab 08/17/21 ?1842 08/17/21 ?1844 08/18/21 ?5868 08/19/21 ?2574 08/20/21 ?0200  ?NA 137 138 140 138 139  ?K 3.8 3.8 3.9 4.0 3.4*  ?CL 103 101 107 104 106  ?CO2 25  --  '24 26 26  '$ ?GLUCOSE 144* 139* 148* 112* 112*  ?BUN '15 19 13 11 12  '$ ?CREATININE 1.22* 1.10* 0.95 1.04* 0.82  ?CALCIUM 9.1  --  8.6* 8.3* 8.1*  ?MG  --   --    --  2.0  --   ? ?GFR: ?Estimated Creatinine Clearance: 35 mL/min (by C-G formula based on SCr of 0.82 mg/dL). ?Liver Function Tests: ?Recent Labs  ?Lab 08/17/21 ?1842  ?AST 37  ?ALT 35  ?ALKPHOS 81  ?BILITOT 1.0

## 2021-08-20 NOTE — Progress Notes (Signed)
MD approved pt to travel off unit without tele.  ?

## 2021-08-20 NOTE — Progress Notes (Signed)
ANTICOAGULATION CONSULT NOTE ? ?Pharmacy Consult for heparin to [place] transition  ?Indication: chest pain/ACS ? ?Allergies  ?Allergen Reactions  ? Septra Ds [Sulfamethoxazole-Trimethoprim]   ? Lisinopril Other (See Comments)  ?  Cough  ? ? ?Patient Measurements: ?Height: '5\' 5"'$  (165.1 cm) ?Weight: 48.6 kg (107 lb 2.3 oz) ?IBW/kg (Calculated) : 57 ? ? ?Vital Signs: ?Temp: 98.5 ?F (36.9 ?C) (04/01 1150) ?Temp Source: Oral (04/01 0706) ?BP: 148/59 (04/01 1150) ?Pulse Rate: 68 (04/01 1150) ? ?Labs: ?Recent Labs  ?  08/17/21 ?1842 08/17/21 ?1844 08/18/21 ?7782 08/19/21 ?4235 08/19/21 ?1333 08/19/21 ?2156 08/20/21 ?0200  ?HGB 14.3   < > 12.9 11.0*  --   --  9.4*  ?HCT 45.2   < > 38.7 33.3*  --   --  28.6*  ?PLT 208  --  143* 100*  --   --  98*  ?APTT 40*  --   --  122* 81* 60*  --   ?LABPROT 30.3*  --   --   --   --   --   --   ?INR 2.9*  --   --   --   --   --   --   ?HEPARINUNFRC  --   --   --  >1.10*  --   --  0.95*  ?CREATININE 1.22*   < > 0.95 1.04*  --   --  0.82  ? < > = values in this interval not displayed.  ? ? ? ?Estimated Creatinine Clearance: 35 mL/min (by C-G formula based on SCr of 0.82 mg/dL). ? 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 Pharmacy has been consulted to transition from heparin drip to home dose xarelto s/p ortho procedure on hip.  ? ?Current Hgb 9.4 and Plt 98. No signs of bleed at this time.  ? ?Goal of Therapy:  ?Heparin level 0.3-0.5 units/ml ?aPTT 66-85 seconds ?Monitor platelets by anticoagulation protocol: Yes ?  ?Plan:  ?Stop heparin 500 units/hr ?Start Xarelto 15 mg daily as PTA  ?Monitor for s/sx of bleed  ?Daily CBC  ? ?Cephus Slater, PharmD, MBA ?Pharmacy Resident ?[phone] ?08/20/2021 12:30 PM ? ? ? ? ?  ? ? ? ?

## 2021-08-20 NOTE — Progress Notes (Signed)
PHARMACY NOTE:  ANTIMICROBIAL RENAL DOSAGE ADJUSTMENT ? ?Current antimicrobial regimen includes a mismatch between antimicrobial dosage and estimated renal function.  As per policy approved by the Pharmacy & Therapeutics and Medical Executive Committees, the antimicrobial dosage will be adjusted accordingly. ? ?Current antimicrobial dosage:  cefazolin 2gm IV q8h x 3 ? ?Indication: surgical prophylaxis ? ?Renal Function: ? ?Estimated Creatinine Clearance: 35 mL/min (by C-G formula based on SCr of 0.82 mg/dL). ?'[]'$      On intermittent HD, scheduled: ?'[]'$      On CRRT ?   ?Antimicrobial dosage has been changed to:   ?Cefazolin 2gm IV q12h x 2 doses ? ?Additional comments: ? ? ?Floy Angert A. Levada Dy, PharmD, BCPS, FNKF ?Clinical Pharmacist ?[place] ?Please utilize Amion for appropriate phone number to reach the unit pharmacist ([place]) ? ?08/20/2021 4:14 PM ?

## 2021-08-20 NOTE — Plan of Care (Signed)
  Problem: Pain Managment: Goal: General experience of comfort will improve Outcome: Progressing   Problem: Safety: Goal: Ability to remain free from injury will improve Outcome: Progressing   Problem: Skin Integrity: Goal: Risk for impaired skin integrity will decrease Outcome: Progressing   

## 2021-08-20 NOTE — Transfer of Care (Signed)
Immediate Anesthesia Transfer of Care Note ? ?Patient: [name] Small ? ?Procedure(s) Performed: ARTHROPLASTY BIPOLAR HIP (HEMIARTHROPLASTY) (Right: Hip) ? ?Patient Location: PACU ? ?Anesthesia Type:General ? ?Level of Consciousness: awake and drowsy ? ?Airway & Oxygen Therapy: Patient Spontanous Breathing and Patient connected to nasal cannula oxygen ? ?Post-op Assessment: Report given to RN and Post -op Vital signs reviewed and stable ? ?Post vital signs: Reviewed and stable ? ?Last Vitals:  ?Vitals Value Taken Time  ?BP 115/76 08/20/21 1103  ?Temp    ?Pulse 65 08/20/21 1109  ?Resp 13 08/20/21 1109  ?SpO2 100 % 08/20/21 1109  ?Vitals shown include unvalidated device data. ? ?Last Pain:  ?Vitals:  ? 08/20/21 0706  ?TempSrc: Oral  ?PainSc:   ?   ? ?  ? ?Complications: No notable events documented. ?

## 2021-08-20 NOTE — Anesthesia Postprocedure Evaluation (Signed)
Anesthesia Post Note ? ?Patient: PAISYN GUERCIO ? ?Procedure(s) Performed: ARTHROPLASTY BIPOLAR HIP (HEMIARTHROPLASTY) (Right: Hip) ? ?  ? ?Patient location during evaluation: PACU ?Anesthesia Type: General ?Level of consciousness: awake ?Pain management: pain level controlled ?Vital Signs Assessment: post-procedure vital signs reviewed and stable ?Respiratory status: spontaneous breathing, nonlabored ventilation, respiratory function stable and patient connected to nasal cannula oxygen ?Cardiovascular status: blood pressure returned to baseline and stable ?Postop Assessment: no apparent nausea or vomiting ?Anesthetic complications: no ? ? ?No notable events documented. ? ?Last Vitals:  ?Vitals:  ? 08/20/21 1230 08/20/21 1535  ?BP: (!) 156/55 (!) 157/59  ?Pulse: 65 63  ?Resp: 16 16  ?Temp: 36.9 ?C 37.1 ?C  ?SpO2: 99% 96%  ?  ?Last Pain:  ?Vitals:  ? 08/20/21 1811  ?TempSrc:   ?PainSc: Asleep  ? ? ?  ?  ?  ?  ?  ?  ? ?Sheina Mcleish P Larena Ohnemus ? ? ? ? ?

## 2021-08-20 NOTE — Anesthesia Preprocedure Evaluation (Addendum)
Anesthesia Evaluation  ?Patient identified by MRN, date of birth, ID band ?Patient awake ? ? ? ?Reviewed: ?Allergy & Precautions, NPO status , Patient's Chart, lab work & pertinent test results ? ?Airway ?Mallampati: III ? ?TM Distance: >3 FB ?Neck ROM: Full ? ? ? Dental ?no notable dental hx. ? ?  ?Pulmonary ?neg pulmonary ROS,  ?  ?Pulmonary exam normal ? ? ? ? ? ? ? Cardiovascular ?hypertension, Pt. on home beta blockers ?Normal cardiovascular exam+ dysrhythmias Atrial Fibrillation + pacemaker  ? ?ECHO: ?Left ventricular ejection fraction, by estimation, is >75%. The left ventricle has hyperdynamic function. The left ventricle has no regional wall motion abnormalities. ?There is mild left ventricular hypertrophy. Left ventricular diastolic parameters are ?consistent with Grade I diastolic dysfunction (impaired relaxation). Elevated left atrial pressure. ?Right ventricular systolic function is normal. The right ventricular size is normal. ?There is mildly elevated pulmonary artery systolic pressure. ?The mitral valve is normal in structure. Trivial mitral valve regurgitation. No evidence of mitral stenosis. ?The aortic valve is tricuspid. Aortic valve regurgitation is trivial. Aortic valve sclerosis/calcification is present, without any evidence of aortic stenosis. ?The inferior vena cava is normal in size with greater than 50% respiratory variability, suggesting right atrial pressure of 3 mmHg. ?Agitated saline contrast bubble study was negative, with no evidence of any interatrial shunt. ?  ?Neuro/Psych ? Headaches, CVA, Residual Symptoms negative psych ROS  ? GI/Hepatic ?Neg liver ROS, PUD, GERD  Controlled and Medicated,  ?Endo/Other  ?negative endocrine ROS ? Renal/GU ?Renal disease  ? ?  ?Musculoskeletal ?Ambulates with walker  ? Abdominal ?  ?Peds ? Hematology ? ?(+) Blood dyscrasia, anemia , Thrombocytopenia   ?Anesthesia Other Findings ?right femoral neck fracture ?  Reproductive/Obstetrics ? ?  ? ? ? ? ? ? ? ? ? ? ? ? ? ?  ?  ? ? ? ? ? ? ? ?Anesthesia Physical ?Anesthesia Plan ? ?ASA: 3 ? ?Anesthesia Plan: General  ? ?Post-op Pain Management:   ? ?Induction: Intravenous ? ?PONV Risk Score and Plan: 3 and Ondansetron, Dexamethasone and Treatment may vary due to age or medical condition ? ?Airway Management Planned: Oral ETT ? ?Additional Equipment:  ? ?Intra-op Plan:  ? ?Post-operative Plan: Extubation in OR ? ?Informed Consent: I have reviewed the patients History and Physical, chart, labs and discussed the procedure including the risks, benefits and alternatives for the proposed anesthesia with the patient or authorized representative who has indicated his/her understanding and acceptance.  ? ? ? ?Dental advisory given ? ?Plan Discussed with: CRNA ? ?Anesthesia Plan Comments:   ? ? ? ? ? ?Anesthesia Quick Evaluation ? ?

## 2021-08-20 NOTE — Op Note (Signed)
08/17/2021 - 08/20/2021 ? ?10:42 AM ? ?PATIENT:  [name] Small  1929/02/22 female  ? ?MEDICAL RECORD NUMBER: [id number] ? ?PRE-OPERATIVE DIAGNOSIS:  DISPLACED RIGHT FEMORAL NECK FRACTURE ? ?POST-OPERATIVE DIAGNOSIS:  DISPLACED RIGHT FEMORAL NECK FRACTURE ? ?PROCEDURE:  Procedure(s): ?UNIPOLAR HEMIARTHROPLASTY OF THE RIGHT HIP with [place] Basic #6 femoral stem, -3 neck, 46 mm head ? ?SURGEON:  Surgeon(s) and Role: ?   Altamese Glasgow, MD - Primary ? ?PHYSICIAN ASSISTANT: 1. Ainsley Spinner, PA-C; 2. PA Student ? ?ANESTHESIA:   general ? ?EBL:  150 mL  ? ?BLOOD ADMINISTERED:none ? ?DRAINS: none  ? ?LOCAL MEDICATIONS USED:  NONE ? ?SPECIMEN:  No Specimen ? ?DISPOSITION OF SPECIMEN:  N/A ? ?COUNTS:  YES ? ?TOURNIQUET:  * No tourniquets in log * ? ?DICTATION: Note written in EPIC ? ?PLAN OF CARE: Admit to inpatient  ? ?PATIENT DISPOSITION:  PACU - hemodynamically stable. ?  ?Delay start of Pharmacological VTE agent (>24hrs) due to surgical blood loss or risk of bleeding: no ? ?BRIEF SUMMARY OF INDICATION FOR PROCEDURE:  [name] Small is a very ?pleasant 87 y.o. ambulator with a walker, who sustained an fall producing inability to bear weight, shortening, ?and external rotation of the extremity.  She was seen and evaluated with the recommendation for hemiarthroplasty. I discussed with the patient and family the ?risks and benefits, inclding the potential for leg length ?inequality, dislocation or instability, arthritis, loss of motion, DVT, PE, heart attack, stroke, and death.  Consent was given to proceed. ? ?BRIEF SUMMARY OF PROCEDURE:  The patient was taken to the operating room where general anesthesia was induced and after administration of preoperative ?antibiotics consisting of 2 g of Ancef.  She was positioned with the right side up and all prominences were padded appropriately.  We made a 10 cm ?incision after the time-out, carrying dissection down to the IT band, was split in line with the skin.  Cerebellar retractor  was ?placed and we were able to then flex and internally rotate the hip releasing the piriformis and short rotators at their insertions.  The capsule was then T'd, ?tagging the corners with #1 Vicryl.  The neck cut was refined using a cutting guide and then this was followed by removal of the head, which ?sized perfectly to 46 mm. Acetabular trials were placed, confirming this size as the best fit. [employer] ?retractors were placed along the proximal femur, which was then prepared with the canal finder,  ?then lateralizer, followed by reamers up to #6, and the broaches, achieving  ?outstanding fit and fill with the #6 broach.  The calcar reamer was used to refine the cut ?as we were using a low demand stem.  The canal was irrigated thoroughly ?and the acetabulum once again searched multiple times for fragments and ?irrigated thoroughly.  Trial components were placed and the patient had ?outstanding stability in combine 90 degrees of flexion, adduction, and internal ?rotation as well as in external rotation and extension.  Consequently, ?actual components were placed.  My assistant Ainsley Spinner, was necessary ?for delivery and control of the proximal femur during preparation, also during ?relocation and dislocation of the trial components as well as ?relocation of the actual components.  He assisted me with wound closure ?as well.  I did repair the capsule with #1 Vicryl and then used #2 ?FiberWire through bone tunnels to repair the short rotators and ?piriformis.  This was followed by a #1 Vicryl for the IT band and lastly ?  2-0 Vicryl and nylon for the subcutaneous and skin.  Sterile gently ?compressive dressing was applied.  The patient was awakened from ?anesthesia and transported to the PACU in stable condition. ? ?PROGNOSIS:  The patient will be weightbearing as tolerated with ?posterior hip precautions.  Patient has an elevated risk of complications related to declining overall health with recent stroke  and mobility.  [name] Small remains on ?the Medical Service and will be on DVT prophylaxis mechanically and with whatever long-term prophylaxis Dr. Clydene Fake service deems appropriate given her recent stroke and downward drifting platelet count. ? ? ? ? ?Astrid Divine. Marcelino Scot, M.D. ?  ?

## 2021-08-20 NOTE — Progress Notes (Signed)
ANTICOAGULATION CONSULT NOTE ? ?Pharmacy Consult for heparin  ?Indication: chest pain/ACS ? ?Allergies  ?Allergen Reactions  ? Septra Ds [Sulfamethoxazole-Trimethoprim]   ? Lisinopril Other (See Comments)  ?  Cough  ? ? ?Patient Measurements: ?Weight: 48.6 kg (107 lb 2.3 oz) ? ? ?Vital Signs: ?Temp: 98.9 ?F (37.2 ?C) (03/31 2342) ?Temp Source: Oral (03/31 2342) ?BP: 154/62 (03/31 2342) ?Pulse Rate: 74 (03/31 2342) ? ?Labs: ?Recent Labs  ?  08/17/21 ?1842 08/17/21 ?1844 08/18/21 ?5427 08/19/21 ?0623 08/19/21 ?1333 08/19/21 ?2156  ?HGB 14.3 15.6* 12.9 11.0*  --   --   ?HCT 45.2 46.0 38.7 33.3*  --   --   ?PLT 208  --  143* 100*  --   --   ?APTT 40*  --   --  122* 81* 60*  ?LABPROT 30.3*  --   --   --   --   --   ?INR 2.9*  --   --   --   --   --   ?HEPARINUNFRC  --   --   --  >1.10*  --   --   ?CREATININE 1.22* 1.10* 0.95 1.04*  --   --   ? ? ? ?Estimated Creatinine Clearance: 27.6 mL/min (A) (by C-G formula based on SCr of 1.04 mg/dL (H)). ? 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 Plans are for hip surgery and pharmacy consulted to dose heparin.  ? ?APTT subtherapeutic: 60 seconds - No issues with line or bleeding reported per RN. ? ?Goal of Therapy:  ?Heparin level 0.3-0.5 units/ml ?aPTT 66-85 seconds ?Monitor platelets by anticoagulation protocol: Yes ?  ?Plan:  ?Increase heparin to 500 units/hr ?Will f/u 8 hr aPTT  ?Monitor s/sx of bleeding ? ?Georga Bora, PharmD ?Clinical Pharmacist ?08/20/2021 12:12 AM ?Please check AMION for all [place] numbers ? ? ? ?  ? ? ? ?

## 2021-08-21 ENCOUNTER — Encounter (HOSPITAL_COMMUNITY): Payer: Self-pay | Admitting: Internal Medicine

## 2021-08-21 DIAGNOSIS — E559 Vitamin D deficiency, unspecified: Secondary | ICD-10-CM

## 2021-08-21 HISTORY — DX: Vitamin D deficiency, unspecified: E55.9

## 2021-08-21 LAB — CBC
HCT: 26.8 % — ABNORMAL LOW (ref 36.0–46.0)
Hemoglobin: 8.2 g/dL — ABNORMAL LOW (ref 12.0–15.0)
MCH: 29.8 pg (ref 26.0–34.0)
MCHC: 30.6 g/dL (ref 30.0–36.0)
MCV: 97.5 fL (ref 80.0–100.0)
Platelets: 111 10*3/uL — ABNORMAL LOW (ref 150–400)
RBC: 2.75 MIL/uL — ABNORMAL LOW (ref 3.87–5.11)
RDW: 14.2 % (ref 11.5–15.5)
WBC: 9.1 10*3/uL (ref 4.0–10.5)
nRBC: 0 % (ref 0.0–0.2)

## 2021-08-21 LAB — VITAMIN D 25 HYDROXY (VIT D DEFICIENCY, FRACTURES): Vit D, 25-Hydroxy: 12.78 ng/mL — ABNORMAL LOW (ref 30–100)

## 2021-08-21 LAB — BASIC METABOLIC PANEL
Anion gap: 6 (ref 5–15)
BUN: 12 mg/dL (ref 8–23)
CO2: 26 mmol/L (ref 22–32)
Calcium: 8 mg/dL — ABNORMAL LOW (ref 8.9–10.3)
Chloride: 105 mmol/L (ref 98–111)
Creatinine, Ser: 0.67 mg/dL (ref 0.44–1.00)
GFR, Estimated: >60 mL/min (ref >60)
Glucose, Bld: 151 mg/dL — ABNORMAL HIGH (ref 70–99)
Potassium: 4.5 mmol/L (ref 3.5–5.1)
Sodium: 137 mmol/L (ref 135–145)

## 2021-08-21 LAB — MAGNESIUM: Magnesium: 2 mg/dL (ref 1.7–2.4)

## 2021-08-21 MED ORDER — SODIUM CHLORIDE 0.9 % IV BOLUS
500.0000 mL | Freq: Once | INTRAVENOUS | Status: AC
  Administered 2021-08-21: 500 mL via INTRAVENOUS

## 2021-08-21 NOTE — Progress Notes (Signed)
?PROGRESS NOTE ? ? ? ?[name] Small  HQI:[id number] DOB: August 02, 1928 DOA: 08/17/2021 ?PCP: [name] Ruddy, MD  ? ? ?Brief Narrative:  ?[name] Small is a 88 year old female with past medical history significant for chronic atrial fibrillation on Xarelto, sick sinus syndrome s/p PPM, CKD stage IIIa, essential hypertension, HLD, GERD, vertebral compression fracture s/p vertebroplasty 02/2021 who presented to [facility] ED on 3/29 via EMS as a code stroke for evaluation of slurred speech, facial droop, left-sided weakness.  Last known normal at 1503/29 and was found down on the floor by family at 72.  Patient complained of right hip pain, unable to ambulate.  Patient was confused on ED presentation and stated that she was walking using her walker and fell; but is unsure how this occurred.  Reporting pain in her right hip.  Also reports dysuria.  Denies chest pain or shortness of breath.  Son states he was with the patient and she appeared normal total 1500; then he went out to get food and when he returned at 1530, she was on the floor and appeared confused with slurred speech, right leg pain and facial droop.  She was unable to get up from the floor. ? ?Patient was recently admitted 08/10/2021 through 08/12/2021 for acute metabolic encephalopathy secondary to UTI treated with vancomycin and Rocephin.  Patient was transition to nitrofurantoin based on prior cultures with plan outpatient urology follow-up due to recurrent UTIs. ? ?In the ED, temperature 98.0 ?F, HR 72, RR 16, BP 153/76, SPO2 93% on room air.  Sodium 137, potassium 3.8, chloride 103, CO2 25, glucose 144, BUN 15, creatinine 1.22, AST 37, ALT 35.  WBC 19.6, hemoglobin 14.3, platelets 208.  INR 2.9.  TSH 1.864.  EtOH level less than 10.  Chest x-ray with no acute cardiopulmonary disease process.  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  CTA head/neck with decreased  perfusion posterior left MCA, moderate narrowing distal left M1 segment, multifocal narrowing bilateral PCAs, 50% proximal left ICA stenosis, unchanged.  X-ray right hip/pelvis with acute displaced right femoral neck fracture.  Neurology and orthopedics were consulted.  Patient was not a tPA candidate due to chronic anticoagulation on Xarelto.  Hospital service consulted for further evaluation and management of hip fracture and concern for acute CVA.  ? ? ?Assessment and Plan: ?* Acute CVA (cerebrovascular accident) ([place]) ?Patient presented to the ED with acute onset slurred speech, facial droop, and left-sided weakness.  Last known well at 1500 on 08/17/2021. tPA not administered as she is chronically anticoagulated. 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 CT angio head and neck decreased perfusion posterior left MCA, mild to moderate narrowing in the distal left M1 segment, no additional focal stenosis or occlusion is seen in the left MCA branches, multifocal narrowing in the bilateral PCAs. No other ?hemodynamically significant intracranial stenosis, 50% proximal left ICA stenosis, unchanged, no additional hemodynamically significant stenosis in the neck.  LDL 67.  Hemoglobin A1c 5.7. MR brain with acute left frontal and parietal infarcts.  TTE with LVEF greater than 75%, LV with no regional wall motion normalities, mild LVH, grade 1 diastolic dysfunction, trivial MR, no aortic stenosis, IVC normal in size, no evidence of intra-atrial shunt. ?--Neurology following, appreciate assistance ?--Aspirin 81 mg p.o. daily ?--Xarelto '15mg'$  PO daily ?--Monitor on telemetry ?--Seen by SLP ?--PT/OT evaluation today ?--Anticipate likely need of SNF placement ? ?Hip fracture (  [place]) ?Patient with unwitnessed fall at home with x-ray notable for acute displaced right femoral neck fracture.  Underwent right hemiarthroplasty on 08/20/2021 by Dr. Marcelino Small ?--Orthopedics following, appreciate  assistance ?--home Xarelto for DVT PPX ?--Oxycodone 5 mg p.o. q6h PRN moderate pain ?--Morphine 1 mg IV every 4 hours as needed severe pain ?--Robaxin '500mg'$  PO q6h PRN muscle spasm ?--WBAT with posterior hip precautions ?--PT/OT eval pending today ? ? ?Essential hypertension ?Home medications include metoprolol tartrate 37.5 mg p.o. twice daily ?--Resume home metoprolol 37.'5mg'$  BID ? ?Atrial fibrillation, chronic ([place]) ?--Amiodarone 200 mg p.o. daily ?--metoprolol tartrate 37.5 mg p.o. twice daily  ?--Xarelto '15mg'$  PO daily ?--Monitor on telemetry ? ?Stage 3a chronic kidney disease (CKD) ([place]) ?Creatinine 1.2 on admission with baseline 0.8-1.0.  Was given a 500 mL IV fluid bolus in ED. ?--Cr 1.2>0.95>1.04>0.82>0.67 ?--Avoid nephrotoxins, renal dose all medications ?--BMP daily ? ?Leukocytosis ?Patient is afebrile, but history of recurrent UTIs.  Unsure if this is related to recurrent UTI or stress reaction from fracture ?--WBC 19.6>23.5>12.1>10.4>9.1 ?--Urine culture with insignificant growth ?--ceftriaxone 1 g IV q24h; plan 5-day course (Day#4/5) ?--CBC daily ? ?Sick sinus syndrome ([place]) ?Status post PPM ?--Monitor on telemetry ? ?Hypokalemia ?Potassium 4.5 today. ?--Repeat electrolytes in a.m. ? ? ? ?DVT prophylaxis: SCDs Start: 08/17/21 2227 ?Rivaroxaban (XARELTO) tablet 15 mg  ?  Code Status: DNR ?Family Communication: No family present at bedside this morning, updated son [name] Small via telephone this afternoon ? ?Disposition Plan:  ?Level of care: Telemetry Medical ?Status is: Inpatient ?Remains inpatient appropriate because: Awaiting PT/OT evaluation, anticipate likely need of SNF placement ? ?Consultants:  ?Neurology ?Orthopedics ? ?Procedures:  ?TTE:  ?Right hip hemiarthroplasty 4/1, Dr. Marcelino Small ? ?Antimicrobials:  ?Ceftriaxone 3/30>> ? ? ?Subjective: ?Patient seen examined bedside, resting comfortably.  Pain controlled.  Awaiting PT/OT evaluation.  JNo other complaints or concerns at this time.  Denies headache,  no chest pain, no palpitations, no shortness of breath, no abdominal pain.  No acute events overnight per nursing staff. ? ?Objective: ?Vitals:  ? 08/20/21 1230 08/20/21 1535 08/21/21 0328 08/21/21 0735  ?BP: (!) 156/55 (!) 157/59 (!) 147/28 (!) 147/57  ?Pulse: 65 63 65 60  ?Resp: 16 16 (!) 22 17  ?Temp: 98.5 ?F (36.9 ?C) 98.7 ?F (37.1 ?C) 98.4 ?F (36.9 ?C) 97.6 ?F (36.4 ?C)  ?TempSrc: Oral Oral Oral Oral  ?SpO2: 99% 96% 95% 100%  ?Weight:      ?Height:      ? ? ?Intake/Output Summary (Last 24 hours) at 08/21/2021 1044 ?Last data filed at 08/21/2021 9518 ?Gross per 24 hour  ?Intake 400 ml  ?Output 300 ml  ?Net 100 ml  ? ?Filed Weights  ? 08/17/21 1800 08/20/21 0711  ?Weight: 48.6 kg 48.6 kg  ? ? ?Examination: ? ?Physical Exam: ?GEN: NAD, alert, elderly in appearance ?HEENT: NCAT, PERRL, EOMI, sclera clear, MMM ?PULM: CTAB w/o wheezes/crackles, normal respiratory effort, on room air ?CV: RRR w/o M/G/R ?GI: abd soft, NTND, NABS, no R/G/M ?MSK: no peripheral edema, right lower extremity with surgical dressing in place, clean/dry/intact ?NEURO: Left-sided weakness noted, fluent speech and no obvious facial droop ?PSYCH: normal mood/affect ?Integumentary: dry/intact, no rashes or wounds ? ? ? ?Data Reviewed: I have personally reviewed following labs and imaging studies ? ?CBC: ?Recent Labs  ?Lab 08/17/21 ?1842 08/17/21 ?1844 08/18/21 ?8416 08/19/21 ?6063 08/20/21 ?0200 08/21/21 ?0205  ?WBC 19.6*  --  23.5* 12.1* 10.4 9.1  ?NEUTROABS 16.9*  --   --   --   --   --   ?  HGB 14.3 15.6* 12.9 11.0* 9.4* 8.2*  ?HCT 45.2 46.0 38.7 33.3* 28.6* 26.8*  ?MCV 94.8  --  91.7 92.0 92.0 97.5  ?PLT 208  --  143* 100* 98* 111*  ? ?Basic Metabolic Panel: ?Recent Labs  ?Lab 08/17/21 ?1842 08/17/21 ?1844 08/18/21 ?5992 08/19/21 ?3414 08/20/21 ?0200 08/21/21 ?0205  ?NA 137 138 140 138 139 137  ?K 3.8 3.8 3.9 4.0 3.4* 4.5  ?CL 103 101 107 104 106 105  ?CO2 25  --  '24 26 26 26  '$ ?GLUCOSE 144* 139* 148* 112* 112* 151*  ?BUN '15 19 13 11 12 12   '$ ?CREATININE 1.22* 1.10* 0.95 1.04* 0.82 0.67  ?CALCIUM 9.1  --  8.6* 8.3* 8.1* 8.0*  ?MG  --   --   --  2.0  --  2.0  ? ?GFR: ?Estimated Creatinine Clearance: 35.9 mL/min (by C-G formula based on SCr of 0.67 mg/dL). ?Liver Function

## 2021-08-21 NOTE — Evaluation (Addendum)
Physical Therapy Evaluation ?Patient Details ?Name: [name] Small ?MRN: [id number] ?DOB: 29-Jul-1928 ?Today's Date: 08/21/2021 ? ?History of Present Illness ? 88 yo admitted after she was on the floor and appeared confused with slurred speech, right leg pain and facial droop. Workup revealed CVA,  acute left frontal and parietal infarcts, and R femoral neck fx; s/p R hip hemiarthroplasty on 08/20/2021, Posterior Hip Precautions, WBAT  ?Clinical Impression ?  ?Pt admitted for concerns listed above. PTA pt's family  reported that she was fairly independent, however in the past 6 months, family has been providing increased support for all IADL's and at times ADL's. At this time, pt presents with cognitive deficits, increased pain, weakness, balance deficits, and limited activity tolerance. Pt was pleasantly confused throughout the session, getting "stuck" on a few topics, which she would use to respond to most questions throughout the session. Often requiring multimodal cuing to attend to tasks. Additionally, at this time, she is requiring max A +2 for bed mobility, mod A +2 for sit<>stands, and max A +2 for pivot transfers. Pt is overall tolerating all mobility well and anticipate her making good progress with SNF level therapies.  Will benefit from acute PT to maximize independence and safety with mobility in prep for dc to post-acute rehab ?   ? ?Recommendations for follow up therapy are one component of a multi-disciplinary discharge planning process, led by the attending physician.  Recommendations may be updated based on patient status, additional functional criteria and insurance authorization. ? ?Follow Up Recommendations Skilled nursing-short term rehab (<3 hours/day) ? ?  ?Assistance Recommended at Discharge Frequent or constant Supervision/Assistance  ?Patient can return home with the following ? A lot of help with walking and/or transfers;Two people to help with walking and/or transfers;A lot of help with  bathing/dressing/bathroom;Two people to help with bathing/dressing/bathroom ? ?  ?Equipment Recommendations Rolling walker (2 wheels);BSC/3in1  ?Recommendations for Other Services ?    ?  ?Functional Status Assessment Patient has had a recent decline in their functional status and demonstrates the ability to make significant improvements in function in a reasonable and predictable amount of time.  ? ?  ?Precautions / Restrictions Precautions ?Precautions: Posterior Hip ?Precaution Booklet Issued: Yes (comment) ?Precaution Comments: Reviewed precautions with pt and family. ?Restrictions ?Weight Bearing Restrictions: Yes ?LLE Weight Bearing: Weight bearing as tolerated  ? ?  ? ?Mobility ? Bed Mobility ?Overal bed mobility: Needs Assistance ?Bed Mobility: Supine to Sit ?  ?  ?Supine to sit: Max assist, +2 for physical assistance, +2 for safety/equipment ?  ?  ?General bed mobility comments: Helicopter motion +2 to scoot and turn to sitting EOB, limited by pain. ?  ? ?Transfers ?Overall transfer level: Needs assistance ?Equipment used: Rolling walker (2 wheels), 2 person hand held assist ?Transfers: Sit to/from Stand, Bed to chair/wheelchair/BSC ?Sit to Stand: Mod assist, +2 physical assistance, +2 safety/equipment ?  ?  ?Squat pivot transfers: Max assist, +2 physical assistance, +2 safety/equipment ?  ?  ?General transfer comment: Pt initially requiring max A +2 to power up to standing and pivotting to the chair. Once pushing up from RW and standing from chair, pt requiring heavy mod A +2, able to maintain for ~1 min with mod posterior support ?  ? ?Ambulation/Gait ?  ?  ?  ?  ?  ?  ?  ?  ? ?Stairs ?  ?  ?  ?  ?  ? ?Wheelchair Mobility ?  ? ?Modified Rankin (Stroke Patients Only) ?  ? ?  ? ?  Balance   ?  ?Sitting balance-Leahy Scale: Fair ?  ?Postural control: Posterior lean, Left lateral lean ?Standing balance support: Bilateral upper extremity supported, Reliant on assistive device for balance ?Standing balance-Leahy  Scale: Poor ?Standing balance comment: In standing, pt requiring constant mod A support posteriorly to remain upright, while pushing down into BUE on the RW. ?  ?  ?  ?  ?  ?  ?  ?  ?  ?  ?  ?   ? ? ? ?Pertinent Vitals/Pain Pain Assessment ?Pain Assessment: Faces ?Faces Pain Scale: Hurts even more ?Pain Location: R hip ?Pain Descriptors / Indicators: Aching, Discomfort, Grimacing, Moaning ?Pain Intervention(s): Limited activity within patient's tolerance  ? ? ?Home Living Family/patient expects to be discharged to:: Private residence ?Living Arrangements: Alone ?Available Help at Discharge: Family;Available PRN/intermittently ?Type of Home: House ?Home Access: Stairs to enter ?Entrance Stairs-Rails: Left ?Entrance Stairs-Number of Steps: 6 ?  ?Home Layout: One level ?Home Equipment: Standard Walker;Shower seat ?Additional Comments: Family lives next door  ?  ?Prior Function Prior Level of Function : History of Falls (last six months);Needs assist ?  ?  ?  ?  ?  ?  ?Mobility Comments: used standard walker at home prior to recent admit. ?ADLs Comments: Family provides assist with all IADL's, and BADL's when needed. ?  ? ? ?Hand Dominance  ? Dominant Hand: Right ? ?  ?Extremity/Trunk Assessment  ? Upper Extremity Assessment ?Upper Extremity Assessment: Defer to OT evaluation ?  ? ?Lower Extremity Assessment ?Lower Extremity Assessment: Generalized weakness;RLE deficits/detail ?RLE Deficits / Details: Grossly decr aROM and strength limited by pain postop; notably good weight acceptance in standing ?RLE: Unable to fully assess due to pain ?  ? ?Cervical / Trunk Assessment ?Cervical / Trunk Assessment: Kyphotic  ?Communication  ? Communication: Expressive difficulties;Receptive difficulties (Difficulty word finding and responding correctly to questions asked.)  ?Cognition Arousal/Alertness: Awake/alert ?Behavior During Therapy: [facility] for tasks assessed/performed ?Overall Cognitive Status: Impaired/Different from  baseline ?Area of Impairment: Orientation, Memory, Following commands, Safety/judgement, Problem solving ?  ?  ?  ?  ?  ?  ?  ?  ?Orientation Level: Disoriented to, Situation, Time ?  ?Memory: Decreased recall of precautions, Decreased short-term memory ?Following Commands: Follows one step commands with increased time ?Safety/Judgement: Decreased awareness of safety, Decreased awareness of deficits ?  ?Problem Solving: Slow processing, Decreased initiation, Requires verbal cues, Requires tactile cues ?General Comments: Pt requiring frequent cuing and reminders for each command or question. When certain topics get brought up, pt perseverates on the topic and responds to questions with previous topics. ?  ?  ? ?  ?General Comments General comments (skin integrity, edema, etc.): VSS on RA HR noted to hit 124 with mobility, however once in sitting, pt returned to 60, with O2 at 92%. ? ? ? 08/22/21 1200  ?Modified Rankin (Stroke Patients Only)  ?Pre-Morbid Rankin Score 2  ?Modified Rankin 5  ? ? ?  ?Exercises    ? ?Assessment/Plan  ?  ?PT Assessment Patient needs continued PT services  ?PT Problem List Decreased strength;Decreased range of motion;Decreased activity tolerance;Decreased balance;Decreased mobility;Decreased coordination;Decreased cognition;Decreased knowledge of use of DME;Decreased safety awareness;Decreased knowledge of precautions;Pain ? ?   ?  ?PT Treatment Interventions DME instruction;Gait training;Functional mobility training;Therapeutic activities;Therapeutic exercise;Balance training;Neuromuscular re-education;Cognitive remediation;Patient/family education   ? ?PT Goals (Current goals can be found in the Care Plan section)  ?Acute Rehab PT Goals ?Patient Stated Goal: Agreeable to getting OOB to teh chair ?PT  Goal Formulation: With patient/family ?Time For Goal Achievement: 09/04/21 ?Potential to Achieve Goals: Good ? ?  ?Frequency Min 3X/week ?  ? ? ?Co-evaluation   ?  ?  ?  ?  ? ? ?  ?AM-PAC PT  "6 Clicks" Mobility  ?Outcome Measure Help needed turning from your back to your side while in a flat bed without using bedrails?: A Lot ?Help needed moving from lying on your back to sitting on the side of a flat bed witho

## 2021-08-21 NOTE — Progress Notes (Signed)
? ?                              Orthopaedic Trauma Service Progress Note ? ?Patient ID: ?[name] Small ?MRN: [id number] ?DOB/AGE: 86-Jun-1930 86 y.o. ? ?Subjective: ? ?Sitting in chair  ?Appears well ?Still confused but awake  ?Ortho issues stable ? ?+ vitamin d deficiency  ? ?ROS ?As above ? ?Objective:  ? ?VITALS:   ?Vitals:  ? 08/20/21 1535 08/21/21 0328 08/21/21 0735 08/21/21 1301  ?BP: (!) 157/59 (!) 147/28 (!) 147/57 (!) 131/58  ?Pulse: 63 65 60 63  ?Resp: 16 (!) '22 17 20  '$ ?Temp: 98.7 ?F (37.1 ?C) 98.4 ?F (36.9 ?C) 97.6 ?F (36.4 ?C) 97.8 ?F (36.6 ?C)  ?TempSrc: Oral Oral Oral Oral  ?SpO2: 96% 95% 100% 94%  ?Weight:      ?Height:      ? ? ?Estimated body mass index is 17.83 kg/m? as calculated from the following: ?  Height as of this encounter: '5\' 5"'$  (1.651 m). ?  Weight as of this encounter: 48.6 kg. ? ? ?Intake/Output   ?   04/01 0701 ?04/02 0700 04/02 0701 ?04/03 0700  ? P.O.    ? I.V. (mL/kg) 1200 (24.7)   ? IV Piggyback 300 100  ? Total Intake(mL/kg) 1500 (30.9) 100 (2.1)  ? Urine (mL/kg/hr) 300 (0.3)   ? Stool    ? Blood 150   ? Total Output 450   ? Net +1050 +100  ?     ?  ? ?LABS ? ?Results for orders placed or performed during the hospital encounter of 08/17/21 (from the past 24 hour(s))  ?CBC     Status: Abnormal  ? Collection Time: 08/21/21  2:05 AM  ?Result Value Ref Range  ? WBC 9.1 4.0 - 10.5 K/uL  ? RBC 2.75 (L) 3.87 - 5.11 MIL/uL  ? Hemoglobin 8.2 (L) 12.0 - 15.0 g/dL  ? HCT 26.8 (L) 36.0 - 46.0 %  ? MCV 97.5 80.0 - 100.0 fL  ? MCH 29.8 26.0 - 34.0 pg  ? MCHC 30.6 30.0 - 36.0 g/dL  ? RDW 14.2 11.5 - 15.5 %  ? Platelets 111 (L) 150 - 400 K/uL  ? nRBC 0.0 0.0 - 0.2 %  ?VITAMIN D 25 Hydroxy (Vit-D Deficiency, Fractures)     Status: Abnormal  ? Collection Time: 08/21/21  2:05 AM  ?Result Value Ref Range  ? Vit D, 25-Hydroxy 12.78 (L) 30 - 100 ng/mL  ?Basic metabolic panel     Status: Abnormal  ? Collection Time: 08/21/21  2:05 AM  ?Result Value Ref Range  ?  Sodium 137 135 - 145 mmol/L  ? Potassium 4.5 3.5 - 5.1 mmol/L  ? Chloride 105 98 - 111 mmol/L  ? CO2 26 22 - 32 mmol/L  ? Glucose, Bld 151 (H) 70 - 99 mg/dL  ? BUN 12 8 - 23 mg/dL  ? Creatinine, Ser 0.67 0.44 - 1.00 mg/dL  ? Calcium 8.0 (L) 8.9 - 10.3 mg/dL  ? GFR, Estimated >60 >60 mL/min  ? Anion gap 6 5 - 15  ?Magnesium     Status: None  ? Collection Time: 08/21/21  2:05 AM  ?Result Value Ref Range  ? Magnesium 2.0 1.7 - 2.4 mg/dL  ? ? ? ?PHYSICAL EXAM:  ? ?Gen: resting comfortably in chair, NAD ?Ext:  ?     Right Lower Extremity  ? Dressing R hip clean,  dry and intact ? Ext warm  ? Hip abduction pillow in place ? No DCT  ? Compartments are soft  ? Distal motor and sensory functions intact ? ? ? ?Assessment/Plan: ?1 Day Post-Op  ? ?Anti-infectives (From admission, onward)  ? ? Start     Dose/Rate Route Frequency Ordered Stop  ? 08/20/21 2200  ceFAZolin (ANCEF) IVPB 2g/100 mL premix       ? 2 g ?200 mL/hr over 30 Minutes Intravenous Every 12 hours 08/20/21 1606 08/21/21 1130  ? 08/20/21 0730  ceFAZolin (ANCEF) IVPB 2g/100 mL premix  Status:  Discontinued       ? 2 g ?200 mL/hr over 30 Minutes Intravenous  Once 08/19/21 1115 08/21/21 1358  ? 08/18/21 1230  cefTRIAXone (ROCEPHIN) 1 g in sodium chloride 0.9 % 100 mL IVPB       ? 1 g ?200 mL/hr over 30 Minutes Intravenous Every 24 hours 08/18/21 1136 08/23/21 1229  ? ?  ?. ? ?POD/HD#: 64 ? ?88 year old female ground-level fall with right femoral neck fracture ? ?-Ground-level fall ? ?-Right femoral neck fracture treated with right hip hemiarthroplasty  ? Weight-bear as tolerated right leg with assistance ? Posterior hip precautions ? PT and OT evaluations ? Okay to discontinue abduction pillow as mentation clears ? Dressing changes as needed starting tomorrow ? Ice as needed for swelling and pain control ? ? ? ?- Pain management: ? Minimize narcotics  ? Recommend tylenol first then opioid if pain uncontrolled ? ?- ABL anemia/Hemodynamics ? Expected blood loss with  injury and surgery  ? Monitor ? ?- Medical issues  ? Per primary and neurology  ? ?- DVT/PE prophylaxis: ? Home anticoagulation resumed ? ?- ID:  ? Periop abx ? ?- Metabolic Bone Disease: ? Vitamin d deficiency  ?  Supplement  ? ?- Activity: ? As above ? ?- Impediments to fracture healing: ? Fragility fracture  ? Vitamin d deficiency  ? ?- Dispo: ? Therapy evals  ? TOC for SNF  ? Ortho issues stable, follow up with ortho in 10-14 days   ? ? ? ?Jari Pigg, PA-C ?[phone] (C) ?08/21/2021, 3:02 PM ? ?[facility] ?[address] [place] [address] ?[phone] Jenetta Downer) ?[phone] (F) ? ? ? ?After 5pm and on the weekends please log on to Amion, go to orthopaedics and the look under the [facility] Call for the provider(s) on call. You can also call our office at [phone] and then follow the prompts to be connected to the call team.  ?  Patient ID: [name] Small, female   DOB: [birth year], 88 y.o.   MRN: [id number] ? ?

## 2021-08-21 NOTE — Progress Notes (Signed)
After reviewing chart, pt noted with poor urine output. Pt bladder scanned, 257m noted. Pt denies any discomfort at this time. 1020mnoted in [place] ?

## 2021-08-21 NOTE — Plan of Care (Signed)
  Problem: Education: Goal: Knowledge of General Education information will improve Description: Including pain rating scale, medication(s)/side effects and non-pharmacologic comfort measures Outcome: Progressing   Problem: Health Behavior/Discharge Planning: Goal: Ability to manage health-related needs will improve Outcome: Progressing   Problem: Clinical Measurements: Goal: Respiratory complications will improve Outcome: Progressing   Problem: Pain Managment: Goal: General experience of comfort will improve Outcome: Progressing   Problem: Safety: Goal: Ability to remain free from injury will improve Outcome: Progressing   Problem: Skin Integrity: Goal: Risk for impaired skin integrity will decrease Outcome: Progressing   

## 2021-08-21 NOTE — Evaluation (Signed)
Occupational Therapy Evaluation ?Patient Details ?Name: [name] Small ?MRN: [id number] ?DOB: 01/05/1929 ?Today's Date: 08/21/2021 ? ? ?History of Present Illness 88 yo admitted after she was on the floor and appeared confused with slurred speech, right leg pain and facial droop. 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 ?Clinical Impression ?  ?Pt admitted for concerns listed above. PTA pt's family  reported that she was fairly independent, however in the past 6 months, family has been providing increased support for all IADL's and at times ADL's. At this time, pt presents with cognitive deficits, increased pain, weakness, balance deficits, and limited activity tolerance. Pt was pleasantly confused throughout the session, getting"stuck" on a few topics, which she would use to respond to most questions throughout the session. Often requiring multimodal cuing to attend to tasks. Additionally, at this time, she is requiring max A +2 for bed mobility, mod A +2 for sit<>stands, and max A +2 for pivot transfers. Pt is overall tolerating all mobility well and anticipate her making good progress with SNF level therapies. OT will continue to follow acutely.   ?   ? ?Recommendations for follow up therapy are one component of a multi-disciplinary discharge planning process, led by the attending physician.  Recommendations may be updated based on patient status, additional functional criteria and insurance authorization.  ? ?Follow Up Recommendations ? Skilled nursing-short term rehab (<3 hours/day)  ?  ?Assistance Recommended at Discharge Frequent or constant Supervision/Assistance  ?Patient can return home with the following Two people to help with walking and/or transfers;Two people to help with bathing/dressing/bathroom;Assistance with feeding;Assistance with cooking/housework;Direct supervision/assist for  medications management;Direct supervision/assist for financial management;Assist for transportation;Help with stairs or ramp for entrance ? ?  ?Functional Status Assessment ? Patient has had a recent decline in their functional status and demonstrates the ability to make significant improvements in function in a reasonable and predictable amount of time.  ?Equipment Recommendations ? Other (comment) (TBD)  ?  ?Recommendations for Other Services   ? ? ?  ?Precautions / Restrictions Precautions ?Precautions: Posterior Hip ?Precaution Booklet Issued: Yes (comment) ?Precaution Comments: Reviewed precautions with pt and family. ?Restrictions ?Weight Bearing Restrictions: Yes ?LLE Weight Bearing: Weight bearing as tolerated  ? ?  ? ?Mobility Bed Mobility ?Overal bed mobility: Needs Assistance ?Bed Mobility: Supine to Sit ?  ?  ?Supine to sit: Max assist, +2 for physical assistance, +2 for safety/equipment ?  ?  ?General bed mobility comments: Helicopter motion +2 to scoot and turn to sitting EOB, limited by pain. ?  ? ?Transfers ?Overall transfer level: Needs assistance ?Equipment used: Rolling walker (2 wheels), 2 person hand held assist ?Transfers: Sit to/from Stand, Bed to chair/wheelchair/BSC ?Sit to Stand: Mod assist, +2 physical assistance, +2 safety/equipment ?  ?Squat pivot transfers: Max assist, +2 physical assistance, +2 safety/equipment ?  ?  ?  ?General transfer comment: Pt initially requiring max A +2 to power up to standing and pivotting to the chair. Once pushing up from RW and standing from chair, pt requiring heavy mod A +2, able to maintain for ~1 min with mod posterior support ?  ? ?  ?Balance Overall balance assessment: Needs assistance ?Sitting-balance support: Single extremity supported, Feet supported ?Sitting balance-Leahy Scale: Fair ?Sitting balance - Comments: Initially sitting balance was poor, pt requiring mod-max A due to L lean and posterior lean, with time, pt was able to  correct her  balance and sit unsupported, however she would not be able to tolerate any challenge to her balance. ?Postural control: Posterior lean, Left lateral lean ?Standing balance support: Bilateral upper extremity supported, Reliant on assistive device for balance ?Standing balance-Leahy Scale: Poor ?Standing balance comment: In standing, pt requiring constant mod A support posteriorly to remain upright, while pushing down into BUE on the RW. ?  ?  ?  ?  ?  ?  ?  ?  ?  ?  ?  ?   ? ?ADL either performed or assessed with clinical judgement  ? ?ADL Overall ADL's : Needs assistance/impaired ?Eating/Feeding: Maximal assistance;Sitting ?  ?Grooming: Maximal assistance;Sitting ?  ?Upper Body Bathing: Maximal assistance;Sitting ?  ?Lower Body Bathing: Total assistance;+2 for physical assistance;+2 for safety/equipment;Sitting/lateral leans;Sit to/from stand;Adhering to hip precautions ?  ?Upper Body Dressing : Maximal assistance;Sitting ?  ?Lower Body Dressing: Total assistance;+2 for physical assistance;+2 for safety/equipment;Sitting/lateral leans;Sit to/from stand ?  ?Toilet Transfer: Maximal assistance;+2 for physical assistance;+2 for safety/equipment;Squat-pivot ?  ?Toileting- Clothing Manipulation and Hygiene: Maximal assistance;+2 for physical assistance;+2 for safety/equipment;Sitting/lateral lean;Sit to/from stand ?  ?  ?  ?Functional mobility during ADLs: Moderate assistance;+2 for physical assistance;+2 for safety/equipment;Rolling walker (2 wheels) ?General ADL Comments: Pt is limited by pain, cognitive deficits, balance deficits, and weakness.  ? ? ? ?Vision Baseline Vision/History: 1 Wears glasses ?Ability to See in Adequate Light: 0 Adequate ?Patient Visual Report: No change from baseline ?Vision Assessment?: Vision impaired- to be further tested in functional context ?Additional Comments: Pt not looking towards stimulus's without increased cuing  ?   ?Perception   ?  ?Praxis   ?  ? ?Pertinent Vitals/Pain Pain  Assessment ?Pain Assessment: Faces ?Faces Pain Scale: Hurts even more ?Pain Location: R hip ?Pain Descriptors / Indicators: Aching, Discomfort, Grimacing, Moaning ?Pain Intervention(s): Limited activity within patient's tolerance, Monitored during session, Repositioned  ? ? ? ?Hand Dominance Right ?  ?Extremity/Trunk Assessment Upper Extremity Assessment ?Upper Extremity Assessment: Generalized weakness (Unable to range against resistance) ?  ?Lower Extremity Assessment ?Lower Extremity Assessment: Defer to PT evaluation ?  ?Cervical / Trunk Assessment ?Cervical / Trunk Assessment: Kyphotic ?  ?Communication Communication ?Communication: Expressive difficulties;Receptive difficulties (Difficulty word finding and responding correctly to questions asked.) ?  ?Cognition Arousal/Alertness: Awake/alert ?Behavior During Therapy: [facility] for tasks assessed/performed ?Overall Cognitive Status: Impaired/Different from baseline ?Area of Impairment: Orientation, Memory, Following commands, Safety/judgement, Problem solving ?  ?  ?  ?  ?  ?  ?  ?  ?Orientation Level: Disoriented to, Situation, Time ?  ?Memory: Decreased recall of precautions, Decreased short-term memory ?Following Commands: Follows one step commands with increased time ?Safety/Judgement: Decreased awareness of safety, Decreased awareness of deficits ?  ?Problem Solving: Slow processing, Decreased initiation, Requires verbal cues, Requires tactile cues ?General Comments: Pt requiring frequent cuing and reminders for each command or question. When certain topics get brought up, pt perseverates on the topic and responds to questions with previous topics. ?  ?  ?General Comments  VSS on RA HR noted to hit 124 with mobility, however once in sitting, pt returned to 60, with O2 at 92%. ? ?  ?Exercises   ?  ?Shoulder Instructions    ? ? ?Home Living Family/patient expects to be discharged to:: Private residence ?Living Arrangements: Alone ?Available Help at Discharge:  Family;Available PRN/intermittently ?Type of Home: House ?Home Access: Stairs to enter ?Entrance Stairs-Number of Steps: 6 ?Entrance Stairs-Rails: Left ?Home Layout: One level ?  ?  ?  Bathroom Shower/Tub: Tub/shower unit

## 2021-08-21 NOTE — Progress Notes (Signed)
Pt total out this shift, 235m. Bladder scanned, 1846mnoted.  ?

## 2021-08-22 ENCOUNTER — Encounter (HOSPITAL_COMMUNITY): Payer: Self-pay | Admitting: Orthopedic Surgery

## 2021-08-22 DIAGNOSIS — S32591A Other specified fracture of right pubis, initial encounter for closed fracture: Secondary | ICD-10-CM

## 2021-08-22 LAB — BASIC METABOLIC PANEL
Anion gap: 8 (ref 5–15)
BUN: 18 mg/dL (ref 8–23)
CO2: 25 mmol/L (ref 22–32)
Calcium: 8.6 mg/dL — ABNORMAL LOW (ref 8.9–10.3)
Chloride: 104 mmol/L (ref 98–111)
Creatinine, Ser: 0.8 mg/dL (ref 0.44–1.00)
GFR, Estimated: >60 mL/min (ref >60)
Glucose, Bld: 107 mg/dL — ABNORMAL HIGH (ref 70–99)
Potassium: 4.2 mmol/L (ref 3.5–5.1)
Sodium: 137 mmol/L (ref 135–145)

## 2021-08-22 LAB — CBC
HCT: 26.2 % — ABNORMAL LOW (ref 36.0–46.0)
Hemoglobin: 8.4 g/dL — ABNORMAL LOW (ref 12.0–15.0)
MCH: 30 pg (ref 26.0–34.0)
MCHC: 32.1 g/dL (ref 30.0–36.0)
MCV: 93.6 fL (ref 80.0–100.0)
Platelets: 162 10*3/uL (ref 150–400)
RBC: 2.8 MIL/uL — ABNORMAL LOW (ref 3.87–5.11)
RDW: 14.5 % (ref 11.5–15.5)
WBC: 11.4 10*3/uL — ABNORMAL HIGH (ref 4.0–10.5)
nRBC: 0 % (ref 0.0–0.2)

## 2021-08-22 LAB — CALCIUM, IONIZED: Calcium, Ionized, Serum: 4.8 mg/dL (ref 4.5–5.6)

## 2021-08-22 NOTE — Progress Notes (Signed)
Speech Language Pathology Treatment: Dysphagia  ?Patient Details ?Name: [name] Small ?MRN: [id number] ?DOB: 05/02/1929 ?Today's Date: 08/22/2021 ?Time: 1130-1145 ?SLP Time Calculation (min) (ACUTE ONLY): 15 min ? ?Assessment / Plan / Recommendation ?Clinical Impression ? [name] Small was seen at bedside for dysphagia treatment. Pt's son and daughter-in-law present at the beginning of the session. Both family members & the pt verbalized that her diet typically consists of regular textures and thin liquids with no modifications. Pt's overall arousal, alertness and attention to PO trials improved since last targeted session (08/18/21). Pt tolerated individual and consecutive sips of thin liquid via straw cup with no overt s/s of aspiration. Pt also tolerated ~5 tsp bites of applesauce with continued success. SLP branched up to regular texture with graham cracker. Pt had delayed and weak mastication of graham cracker, however she was able to clear oral cavity effectively. Overall mastication and awareness of regular textures improved since last targeted session (08/18/21). SLP recommends initiating dysphagia 3 diet with thin liquids at this time. ST will continue to follow for diet appropriateness and safety with upgraded textures. ?  ?HPI HPI: [name] Small is a 88 y.o. female with medical history significant of chronic A-fib on Xarelto, sick sinus syndrome status post pacemaker, CKD stage IIIa, hypertension, hyperlipidemia, GERD, vertebral compression.  Recently admitted 08/10/2021-08/12/2021 for acute metabolic encephalopathy secondary to UTI. Patient presented with slurred speech, facial droop, and left-sided weakness and found down. Found to have right femoral neck fracture. CT no acute infarct. MRI refvealed acute left  frontal and parietal infarcts. ?  ?   ?SLP Plan ? Continue with current plan of care ? ?  ?  ?Recommendations for follow up therapy are one component of a multi-disciplinary discharge planning process, led by the  attending physician.  Recommendations may be updated based on patient status, additional functional criteria and insurance authorization. ?  ? ?Recommendations  ?Diet recommendations: Dysphagia 3 (mechanical soft);Thin liquid ?Liquids provided via: Straw ?Medication Administration: Crushed with puree ?Supervision: Staff to assist with self feeding ?Compensations: Slow rate;Small sips/bites ?Postural Changes and/or Swallow Maneuvers: Seated upright 90 degrees  ?   ?    ?   ? ? ? ? Oral Care Recommendations: Oral care BID ?Follow Up Recommendations: Skilled nursing-short term rehab (<3 hours/day) ?Assistance recommended at discharge: Frequent or constant Supervision/Assistance ?SLP Visit Diagnosis: Dysphagia, unspecified (R13.10) ?Plan: Continue with current plan of care ? ? ? ? ?  ?  ? ? ?Vaughan Sine ? ?08/22/2021, 12:14 PM ?

## 2021-08-22 NOTE — Progress Notes (Signed)
Physical Therapy Treatment ?Patient Details ?Name: [name] Small ?MRN: [id number] ?DOB: 1928/06/26 ?Today's Date: 08/22/2021 ? ? ?History of Present Illness Pt is 88 yo admitted 08/17/21 after she was on the floor and appeared confused with slurred speech, right leg pain and facial droop. Workup revealed CVA,  acute left frontal and parietal infarcts, and R femoral neck fx; s/p R hip hemiarthroplasty on 08/20/2021, Posterior Hip Precautions, WBAT.  Pt with hx including but not limited to afib, HTN, OP, L hip IM nail 2016 ? ?  ?PT Comments  ? ? Pt with good participation and following commands.  She did have signs of pain with movement but reports tolerable and eases at rest.  Pt appears to have weakness throughout all extremities requiring assist and cues for exercises.  Session focused on EOB balance, exercises for strengthening, and 2 partial stands but unable to progress to chair transfer or ambulation with assist of 1 and PT tech not available (nurse tech into assist with sit to supine).  Continue to progress as able.  ?   ?Recommendations for follow up therapy are one component of a multi-disciplinary discharge planning process, led by the attending physician.  Recommendations may be updated based on patient status, additional functional criteria and insurance authorization. ? ?Follow Up Recommendations ? Skilled nursing-short term rehab (<3 hours/day) ?  ?  ?Assistance Recommended at Discharge Frequent or constant Supervision/Assistance  ?Patient can return home with the following Two people to help with walking and/or transfers;Two people to help with bathing/dressing/bathroom;Assistance with cooking/housework;Help with stairs or ramp for entrance ?  ?Equipment Recommendations ? Rolling walker (2 wheels);BSC/3in1  ?  ?Recommendations for Other Services   ? ? ?  ?Precautions / Restrictions Precautions ?Precautions: Posterior Hip ?Precaution Booklet Issued: Yes (comment) ?Precaution Comments: Reviewed precautions with  pt ?Restrictions ?Weight Bearing Restrictions: Yes ?LLE Weight Bearing: Weight bearing as tolerated  ?  ? ?Mobility ? Bed Mobility ?Overal bed mobility: Needs Assistance ?Bed Mobility: Rolling, Supine to Sit, Sit to Supine ?Rolling: Mod assist ?  ?Supine to sit: Mod assist ?Sit to supine: Max assist, +2 for physical assistance ?  ?General bed mobility comments: Rolling: max cues for reaching and flexing opposite leg, assist to maintain posterior precautions.  Supine to sit: cues for sequencing, pt assisting minimally with legs but did reach across and assisted to lift trunk, assist to scoot forward.  Sit to supine: max x 2 due to fatigue ?  ? ?Transfers ?Overall transfer level: Needs assistance ?Equipment used: Rolling walker (2 wheels) ?Transfers: Sit to/from Stand ?Sit to Stand: Mod assist ?  ?  ?  ?  ?  ?General transfer comment: 2 partial sit to stands with cues for hand placement, hip precautions, and mod A to rise.  Pt unable to fully stand with assist of 1 and PT tech not available ?  ? ?Ambulation/Gait ?  ?  ?  ?  ?  ?  ?  ?  ? ? ?Stairs ?  ?  ?  ?  ?  ? ? ?Wheelchair Mobility ?  ? ?Modified Rankin (Stroke Patients Only) ?Modified Rankin (Stroke Patients Only) ?Pre-Morbid Rankin Score: Slight disability ?Modified Rankin: Severe disability ? ? ?  ?Balance Overall balance assessment: Needs assistance ?Sitting-balance support: Single extremity supported, Feet supported, Bilateral upper extremity supported ?Sitting balance-Leahy Scale: Poor ?Sitting balance - Comments: Balance requiring UE support and cues for posterior hip precautions - tending to lean forward.  At EOB for at least 10 mins with close  supervision to min A. With fatigue pt leaning toward R side requiring increased assistance to correct ?Postural control: Right lateral lean ?Standing balance support: Bilateral upper extremity supported ?Standing balance-Leahy Scale: Zero ?Standing balance comment: 2 partial stands with max A ?  ?  ?  ?  ?  ?  ?  ?   ?  ?  ?  ?  ? ?  ?Cognition Arousal/Alertness: Awake/alert ?Behavior During Therapy: [facility] for tasks assessed/performed ?Overall Cognitive Status: Impaired/Different from baseline ?Area of Impairment: Orientation, Memory, Following commands, Safety/judgement, Problem solving ?  ?  ?  ?  ?  ?  ?  ?  ?Orientation Level: Disoriented to, Time ?  ?Memory: Decreased recall of precautions, Decreased short-term memory ?Following Commands: Follows one step commands with increased time ?Safety/Judgement: Decreased awareness of safety, Decreased awareness of deficits ?  ?Problem Solving: Slow processing, Decreased initiation, Requires verbal cues, Requires tactile cues, Difficulty sequencing ?  ?  ?  ? ?  ?Exercises General Exercises - Lower Extremity ?Ankle Circles/Pumps: AROM, Both, 10 reps, Supine ?Quad Sets:  (unable to follow commands) ?Short Arc Quad: AROM, 10 reps, Both, Supine (with visual target) ?Long Arc Quad: AROM, Both, 5 reps, Seated ?Heel Slides: AAROM, Both, 10 reps, Supine (within hip precautions) ?Hip ABduction/ADduction: AAROM, Both, 10 reps, Supine (within hip precautions) ? ?  ?General Comments General comments (skin integrity, edema, etc.): Nurse tech in at end of session to assist with return to bed.  VSS ?  ?  ? ?Pertinent Vitals/Pain Pain Assessment ?Pain Assessment: Faces ?Faces Pain Scale: Hurts little more ?Pain Location: R hip - with movement; eased at rest ?Pain Descriptors / Indicators: Aching, Discomfort, Grimacing, Moaning ?Pain Intervention(s): Limited activity within patient's tolerance, Monitored during session, Repositioned  ? ? ?Home Living   ?  ?  ?  ?  ?  ?  ?  ?  ?  ?   ?  ?Prior Function    ?  ?  ?   ? ?PT Goals (current goals can now be found in the care plan section) Progress towards PT goals: Progressing toward goals ? ?  ?Frequency ? ? ? Min 3X/week ? ? ? ?  ?PT Plan Current plan remains appropriate  ? ? ?Co-evaluation   ?  ?  ?  ?  ? ?  ?AM-PAC PT "6 Clicks" Mobility   ?Outcome  Measure ? Help needed turning from your back to your side while in a flat bed without using bedrails?: A Lot ?Help needed moving from lying on your back to sitting on the side of a flat bed without using bedrails?: A Lot ?Help needed moving to and from a bed to a chair (including a wheelchair)?: Total ?Help needed standing up from a chair using your arms (e.g., wheelchair or bedside chair)?: Total ?Help needed to walk in hospital room?: Total ?Help needed climbing 3-5 steps with a railing? : Total ?6 Click Score: 8 ? ?  ?End of Session Equipment Utilized During Treatment: Gait belt ?Activity Tolerance: Patient tolerated treatment well;Other (comment) (somewhat limited by no PT tech assist, unable to progress with assist of 1) ?Patient left: in bed;with call bell/phone within reach;with bed alarm set ?Nurse Communication: Mobility status;Precautions ?PT Visit Diagnosis: Unsteadiness on feet (R26.81);Other abnormalities of gait and mobility (R26.89);History of falling (Z91.81);Muscle weakness (generalized) (M62.81);Other symptoms and signs involving the nervous system (R29.898) ?  ? ? ?Time: 1429-1500 ?PT Time Calculation (min) (ACUTE ONLY): 31 min ? ?Charges:  $Therapeutic Exercise:  8-22 mins ?$Therapeutic Activity: 8-22 mins          ?          ? ?Abran Richard, PT ?Acute Rehab Services ?Pager [phone] ?[facility] [phone] ? ? ? ?Mikael Spray Tyrek Lawhorn ?08/22/2021, 3:38 PM ? ?

## 2021-08-22 NOTE — Progress Notes (Signed)
?PROGRESS NOTE ? ? ? ?[name] Small  YTK:[id number] DOB: 02/17/1929 DOA: 08/17/2021 ?PCP: [name] Ruddy, MD  ? ? ?Brief Narrative:  ?[name] Small is a 88 year old female with past medical history significant for chronic atrial fibrillation on Xarelto, sick sinus syndrome s/p PPM, CKD stage IIIa, essential hypertension, HLD, GERD, vertebral compression fracture s/p vertebroplasty 02/2021 who presented to [facility] ED on 3/29 via EMS as a code stroke for evaluation of slurred speech, facial droop, left-sided weakness.  Last known normal at 1503/29 and was found down on the floor by family at 46.  Patient complained of right hip pain, unable to ambulate.  Patient was confused on ED presentation and stated that she was walking using her walker and fell; but is unsure how this occurred.  Reporting pain in her right hip.  Also reports dysuria.  Denies chest pain or shortness of breath.  Son states he was with the patient and she appeared normal total 1500; then he went out to get food and when he returned at 1530, she was on the floor and appeared confused with slurred speech, right leg pain and facial droop.  She was unable to get up from the floor. ? ?Patient was recently admitted 08/10/2021 through 08/12/2021 for acute metabolic encephalopathy secondary to UTI treated with vancomycin and Rocephin.  Patient was transition to nitrofurantoin based on prior cultures with plan outpatient urology follow-up due to recurrent UTIs. ? ?In the ED, temperature 98.0 ?F, HR 72, RR 16, BP 153/76, SPO2 93% on room air.  Sodium 137, potassium 3.8, chloride 103, CO2 25, glucose 144, BUN 15, creatinine 1.22, AST 37, ALT 35.  WBC 19.6, hemoglobin 14.3, platelets 208.  INR 2.9.  TSH 1.864.  EtOH level less than 10.  Chest x-ray with no acute cardiopulmonary disease process.  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  CTA head/neck with decreased  perfusion posterior left MCA, moderate narrowing distal left M1 segment, multifocal narrowing bilateral PCAs, 50% proximal left ICA stenosis, unchanged.  X-ray right hip/pelvis with acute displaced right femoral neck fracture.  Neurology and orthopedics were consulted.  Patient was not a tPA candidate due to chronic anticoagulation on Xarelto.  Hospital service consulted for further evaluation and management of hip fracture and concern for acute CVA.  ? ? ?Assessment and Plan: ?* Acute CVA (cerebrovascular accident) ([place]) ?Patient presented to the ED with acute onset slurred speech, facial droop, and left-sided weakness.  Last known well at 1500 on 08/17/2021. tPA not administered as she is chronically anticoagulated. 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 CT angio head and neck decreased perfusion posterior left MCA, mild to moderate narrowing in the distal left M1 segment, no additional focal stenosis or occlusion is seen in the left MCA branches, multifocal narrowing in the bilateral PCAs. No other ?hemodynamically significant intracranial stenosis, 50% proximal left ICA stenosis, unchanged, no additional hemodynamically significant stenosis in the neck.  LDL 67.  Hemoglobin A1c 5.7. MR brain with acute left frontal and parietal infarcts.  TTE with LVEF greater than 75%, LV with no regional wall motion normalities, mild LVH, grade 1 diastolic dysfunction, trivial MR, no aortic stenosis, IVC normal in size, no evidence of intra-atrial shunt. ?--Neurology following, appreciate assistance ?--Aspirin 81 mg p.o. daily ?--Xarelto '15mg'$  PO daily ?--Monitor on telemetry ?--Seen by SLP ?--PT/OT recommend SNF placement ?--Continue therapy efforts while inpatient ?--SNF placement per  TOC ? ?Hip fracture ([place]) ?Patient with unwitnessed fall at home with x-ray notable for acute displaced right femoral neck fracture.  Underwent right hemiarthroplasty on 08/20/2021 by Dr. Marcelino Small.  Vitamin D  25-hydroxy 12.78. ?--Orthopedics following, appreciate assistance ?--home Xarelto for DVT PPX ?--Oxycodone 5 mg p.o. q6h PRN moderate pain ?--Morphine 1 mg IV every 4 hours as needed severe pain ?--Robaxin '500mg'$  PO q6h PRN muscle spasm ?--Started on vitamin D3 2000 units twice daily ?--WBAT with posterior hip precautions ?--PT/OT recommend SNF placement; per TOC ? ? ?Essential hypertension ?Home medications include metoprolol tartrate 37.5 mg p.o. twice daily ?--Resumed home metoprolol 37.'5mg'$  BID ? ?Atrial fibrillation, chronic ([place]) ?--Amiodarone 200 mg p.o. daily ?--metoprolol tartrate 37.5 mg p.o. twice daily  ?--Xarelto '15mg'$  PO daily ?--Monitor on telemetry ? ?Acute renal failure superimposed on stage 3b chronic kidney disease ([place]) ?Creatinine 1.2 on admission with baseline 0.8-1.0.  Was given a 500 mL IV fluid bolus in ED. ?--Cr 1.2>0.95>1.04>0.82>0.67 ?--Avoid nephrotoxins, renal dose all medications ?--BMP daily ? ?Leukocytosis ?Patient is afebrile, but history of recurrent UTIs.  Unsure if this is related to recurrent UTI or stress reaction from fracture ?--WBC 19.6>23.5>12.1>10.4>9.1>11.4 ?--Urine culture with insignificant growth ?--ceftriaxone 1 g IV q24h; plan 5-day course (Day#5/5) ?--CBC daily ? ?Sick sinus syndrome ([place]) ?Status post PPM ?--Monitor on telemetry ? ?Vitamin D deficiency ?Vitamin D 25-hydroxy 12.78, low.  Started on vitamin D3 2000 units twice daily. ? ?Hypokalemia ?BMP pending this am ?--Repeat electrolytes in a.m. ? ? ? ?DVT prophylaxis: SCDs Start: 08/17/21 2227 ?Rivaroxaban (XARELTO) tablet 15 mg  ?  Code Status: DNR ?Family Communication: No family present at bedside this morning, updated son [name] Small via telephone this afternoon ? ?Disposition Plan:  ?Level of care: Telemetry Medical ?Status is: Inpatient ?Remains inpatient appropriate because: Awaiting PT/OT evaluation, anticipate likely need of SNF placement ? ?Consultants:  ?Neurology ?Orthopedics ? ?Procedures:  ?TTE:  ?Right  hip hemiarthroplasty 4/1, Dr. Marcelino Small ? ?Antimicrobials:  ?Ceftriaxone 3/30>> ? ? ?Subjective: ?Patient seen examined bedside, resting comfortably.  Pain controlled.  Pending SNF placement.  No other complaints or concerns at this time.  Denies headache, no chest pain, no palpitations, no shortness of breath, no abdominal pain.  No acute events overnight per nursing staff. ? ?Objective: ?Vitals:  ? 08/21/21 1920 08/21/21 2318 08/22/21 0719 08/22/21 0347  ?BP: (!) 126/58 (!) 134/48 135/70 (!) 141/53  ?Pulse: (!) 59 (!) 59 60 60  ?Resp: '15 16 18   '$ ?Temp: 97.9 ?F (36.6 ?C) 97.6 ?F (36.4 ?C) 98.7 ?F (37.1 ?C)   ?TempSrc: Oral Oral Oral   ?SpO2: 100% 93% 96%   ?Weight:      ?Height:      ? ? ?Intake/Output Summary (Last 24 hours) at 08/22/2021 1021 ?Last data filed at 08/21/2021 1849 ?Gross per 24 hour  ?Intake 290 ml  ?Output 0 ml  ?Net 290 ml  ? ?Filed Weights  ? 08/17/21 1800 08/20/21 0711  ?Weight: 48.6 kg 48.6 kg  ? ? ?Examination: ? ?Physical Exam: ?GEN: NAD, alert, elderly in appearance ?HEENT: NCAT, PERRL, EOMI, sclera clear, MMM ?PULM: CTAB w/o wheezes/crackles, normal respiratory effort, on room air ?CV: RRR w/o M/G/R ?GI: abd soft, NTND, NABS, no R/G/M ?MSK: no peripheral edema, right lower extremity with surgical dressing in place, clean/dry/intact ?NEURO: Cranial nerves II through XII grossly intact, left-sided weakness noted but moves all extremities independently, fluent speech and no obvious facial droop ?PSYCH: normal mood/affect ?Integumentary: dry/intact, no rashes or wounds ? ? ? ?Data  Reviewed: I have personally reviewed following labs and imaging studies ? ?CBC: ?Recent Labs  ?Lab 08/17/21 ?1842 08/17/21 ?1844 08/18/21 ?9924 08/19/21 ?2683 08/20/21 ?0200 08/21/21 ?0205 08/22/21 ?4196  ?WBC 19.6*  --  23.5* 12.1* 10.4 9.1 11.4*  ?NEUTROABS 16.9*  --   --   --   --   --   --   ?HGB 14.3   < > 12.9 11.0* 9.4* 8.2* 8.4*  ?HCT 45.2   < > 38.7 33.3* 28.6* 26.8* 26.2*  ?MCV 94.8  --  91.7 92.0 92.0 97.5 93.6   ?PLT 208  --  143* 100* 98* 111* 162  ? < > = values in this interval not displayed.  ? ?Basic Metabolic Panel: ?Recent Labs  ?Lab 08/18/21 ?0353 08/19/21 ?2229 08/20/21 ?0200 08/21/21 ?0205 08/22/21 ?7989  ?NA 140 13

## 2021-08-22 NOTE — Assessment & Plan Note (Addendum)
-  Vitamin D 25-hydroxy 12.78, low.   ?-Started on Cholecalciferol Vitamin D3 2000 units twice daily. ?

## 2021-08-22 NOTE — NC FL2 (Signed)
?[place] MEDICAID FL2 LEVEL OF CARE SCREENING TOOL  ?  ? ?IDENTIFICATION  ?Patient Name: ?[name] Small Birthdate: 06-Jun-1928 Sex: female Admission Date (Current Location): ?08/17/2021  ?[place] and [place] Number: ? [place] ?  Facility and Address:  ?[facility], [place] [address] [place], [place] [address] ?     Provider Number: ?[id number]  ?Attending Physician Name and Address:  ?[place] Eric J, DO ? Relative Name and Phone Number:  ?  ?   ?Current Level of Care: ?Hospital Recommended Level of Care: ?[place] Prior Approval Number: ?  ? ?Date Approved/Denied: ?  PASRR Number: ?[id number] A ? ?Discharge Plan: ?SNF ?  ? ?Current Diagnoses: ?Patient Active Problem List  ? Diagnosis Date Noted  ? Vitamin D deficiency 08/21/2021  ? Hypokalemia 08/20/2021  ? Hip fracture ([place]) 08/18/2021  ? Leukocytosis 08/18/2021  ? Sick sinus syndrome ([place]) 08/18/2021  ? Acute CVA (cerebrovascular accident) ([place]) 08/17/2021  ? UTI (urinary tract infection) 08/10/2021  ? Altered mental status 08/10/2021  ? GERD (gastroesophageal reflux disease) 08/10/2021  ? Syncope 04/06/2021  ? Acute renal failure superimposed on stage 3b chronic kidney disease ([place]) 04/06/2021  ? COVID-19 virus infection 04/06/2021  ? Compression fracture of L2 ([place]) 03/17/2021  ? Lumbar compression fracture ([place]) 03/17/2021  ? Hypertension   ? Low back pain 03/16/2021  ? Hypertensive urgency 03/16/2021  ? PAF (paroxysmal atrial fibrillation) ([place]) 03/16/2021  ? Compression fracture of L2 lumbar vertebra (HCC)   ? Porokeratosis 05/02/2019  ? Hav (hallux abducto valgus), unspecified laterality 05/02/2019  ? Atrial fibrillation, chronic ([place]) 11/25/2015  ? Hyperglycemia 12/07/2014  ? Atrial fibrillation with RVR-CHADs VASc=4 12/07/2014  ? Intertrochanteric fracture of left hip ([place]) 07/08/2014  ? Overactive bladder 04/02/2012  ? Essential hypertension 04/02/2012  ? Chest pain, atypical 09/17/2011  ? Dehydration 09/16/2011  ?  Hyperlipidemia 11/22/2006  ? DIVERTICULOSIS, COLON 11/22/2006  ? Osteoporosis 11/22/2006  ? ? ?Orientation RESPIRATION BLADDER Height & Weight   ?  ?Self, Place ? Normal Incontinent Weight: 107 lb 2.3 oz (48.6 kg) ?Height:  '5\' 5"'$  (165.1 cm)  ?BEHAVIORAL SYMPTOMS/MOOD NEUROLOGICAL BOWEL NUTRITION STATUS  ?    Continent Diet (see DC summary)  ?AMBULATORY STATUS COMMUNICATION OF NEEDS Skin   ?Extensive Assist Verbally Surgical wounds (closed right hip, silicone dressing) ?  ?  ?  ?    ?     ?     ? ? ?Personal Care Assistance Level of Assistance  ?Bathing, Feeding, Dressing Bathing Assistance: Maximum assistance ?Feeding assistance: Limited assistance ?Dressing Assistance: Maximum assistance ?   ? ?Functional Limitations Info  ?Sight Sight Info: Impaired ?  ?   ? ? ?SPECIAL CARE FACTORS FREQUENCY  ?PT (By licensed PT), OT (By licensed OT)   ?  ?PT Frequency: 5x/wk ?OT Frequency: 5x/wk ?  ?  ?  ?   ? ? ?Contractures Contractures Info: Not present  ? ? ?Additional Factors Info  ?Code Status, Allergies Code Status Info: DNR ?Allergies Info: Septra Ds (Sulfamethoxazole-trimethoprim), Lisinopril ?  ?  ?  ?   ? ?Current Medications (08/22/2021):  This is the current hospital active medication list ?Current Facility-Administered Medications  ?Medication Dose Route Frequency Provider Last Rate Last Admin  ? acetaminophen (TYLENOL) tablet 650 mg  650 mg Oral Q6H PRN Ainsley Spinner, PA-C   650 mg at 08/21/21 1847  ? Or  ? acetaminophen (TYLENOL) suppository 650 mg  650 mg Rectal Q6H PRN Ainsley Spinner, PA-C      ?  amiodarone (PACERONE) tablet 200 mg  200 mg Oral Q lunch Ainsley Spinner, PA-C   200 mg at 08/21/21 1258  ? ascorbic acid (VITAMIN C) tablet 500 mg  500 mg Oral Daily Ainsley Spinner, PA-C   500 mg at 08/22/21 7628  ? aspirin chewable tablet 81 mg  81 mg Oral Daily Ainsley Spinner, PA-C   81 mg at 08/22/21 3151  ? atorvastatin (LIPITOR) tablet 40 mg  40 mg Oral Q lunch Ainsley Spinner, PA-C   40 mg at 08/21/21 1258  ? calcium citrate  (CALCITRATE - dosed in mg elemental calcium) tablet 200 mg of elemental calcium  200 mg of elemental calcium Oral BID Ainsley Spinner, PA-C   200 mg of elemental calcium at 08/22/21 7616  ? cefTRIAXone (ROCEPHIN) 1 g in sodium chloride 0.9 % 100 mL IVPB  1 g Intravenous Q24H Ainsley Spinner, PA-C 200 mL/hr at 08/21/21 1259 1 g at 08/21/21 1259  ? cholecalciferol (VITAMIN D3) tablet 2,000 Units  2,000 Units Oral BID Ainsley Spinner, PA-C   2,000 Units at 08/22/21 0737  ? diphenhydrAMINE (BENADRYL) injection 12.5 mg  12.5 mg Intravenous Q6H PRN Ainsley Spinner, PA-C   12.5 mg at 08/20/21 1062  ? methocarbamol (ROBAXIN) tablet 500 mg  500 mg Oral Q6H PRN Ainsley Spinner, PA-C      ? metoprolol tartrate (LOPRESSOR) tablet 37.5 mg  37.5 mg Oral BID [place] Eric J, DO   37.5 mg at 08/22/21 6948  ? morphine (PF) 2 MG/ML injection 1 mg  1 mg Intravenous Q4H PRN Ainsley Spinner, PA-C      ? oxyCODONE (Oxy IR/ROXICODONE) immediate release tablet 5 mg  5 mg Oral Q6H PRN Ainsley Spinner, PA-C   5 mg at 08/20/21 2056  ? Rivaroxaban (XARELTO) tablet 15 mg  15 mg Oral Daily Ainsley Spinner, PA-C   15 mg at 08/22/21 5462  ? ? ? ?Discharge Medications: ?Please see discharge summary for a list of discharge medications. ? ?Relevant Imaging Results: ? ?Relevant Lab Results: ? ? ?Additional Information ?SS#: [id number] ? ?Geralynn Ochs, LCSW ? ? ? ? ?

## 2021-08-23 ENCOUNTER — Inpatient Hospital Stay (HOSPITAL_COMMUNITY): Payer: Medicare Other

## 2021-08-23 DIAGNOSIS — L8990 Pressure ulcer of unspecified site, unspecified stage: Secondary | ICD-10-CM | POA: Insufficient documentation

## 2021-08-23 DIAGNOSIS — D62 Acute posthemorrhagic anemia: Secondary | ICD-10-CM

## 2021-08-23 LAB — RETICULOCYTES
Immature Retic Fract: 28.9 % — ABNORMAL HIGH (ref 2.3–15.9)
RBC.: 2.54 MIL/uL — ABNORMAL LOW (ref 3.87–5.11)
Retic Count, Absolute: 59.4 10*3/uL (ref 19.0–186.0)
Retic Ct Pct: 2.3 % (ref 0.4–3.1)

## 2021-08-23 LAB — HEMOGLOBIN AND HEMATOCRIT, BLOOD
HCT: 28.7 % — ABNORMAL LOW (ref 36.0–46.0)
Hemoglobin: 9.4 g/dL — ABNORMAL LOW (ref 12.0–15.0)

## 2021-08-23 LAB — BASIC METABOLIC PANEL
Anion gap: 7 (ref 5–15)
BUN: 21 mg/dL (ref 8–23)
CO2: 26 mmol/L (ref 22–32)
Calcium: 8.3 mg/dL — ABNORMAL LOW (ref 8.9–10.3)
Chloride: 105 mmol/L (ref 98–111)
Creatinine, Ser: 0.92 mg/dL (ref 0.44–1.00)
GFR, Estimated: 59 mL/min — ABNORMAL LOW (ref >60)
Glucose, Bld: 104 mg/dL — ABNORMAL HIGH (ref 70–99)
Potassium: 3.9 mmol/L (ref 3.5–5.1)
Sodium: 138 mmol/L (ref 135–145)

## 2021-08-23 LAB — CBC
HCT: 23.8 % — ABNORMAL LOW (ref 36.0–46.0)
Hemoglobin: 7.6 g/dL — ABNORMAL LOW (ref 12.0–15.0)
MCH: 29.7 pg (ref 26.0–34.0)
MCHC: 31.9 g/dL (ref 30.0–36.0)
MCV: 93 fL (ref 80.0–100.0)
Platelets: 167 10*3/uL (ref 150–400)
RBC: 2.56 MIL/uL — ABNORMAL LOW (ref 3.87–5.11)
RDW: 14.6 % (ref 11.5–15.5)
WBC: 10 10*3/uL (ref 4.0–10.5)
nRBC: 0.3 % — ABNORMAL HIGH (ref 0.0–0.2)

## 2021-08-23 LAB — IRON AND TIBC
Iron: 22 ug/dL — ABNORMAL LOW (ref 28–170)
Saturation Ratios: 12 % (ref 10.4–31.8)
TIBC: 185 ug/dL — ABNORMAL LOW (ref 250–450)
UIBC: 163 ug/dL

## 2021-08-23 LAB — PREPARE RBC (CROSSMATCH)

## 2021-08-23 LAB — FERRITIN: Ferritin: 234 ng/mL (ref 11–307)

## 2021-08-23 LAB — VITAMIN B12: Vitamin B-12: 204 pg/mL (ref 180–914)

## 2021-08-23 LAB — FOLATE: Folate: 7.3 ng/mL (ref >5.9)

## 2021-08-23 MED ORDER — SODIUM CHLORIDE 0.9% IV SOLUTION: Freq: Once | INTRAVENOUS | Status: AC

## 2021-08-23 NOTE — Progress Notes (Signed)
Speech Language Pathology Treatment: Dysphagia  ?Patient Details ?Name: [name] Small ?MRN: [id number] ?DOB: [birth year] ?Today's Date: 08/23/2021 ?Time: 5790-3833 ?SLP Time Calculation (min) (ACUTE ONLY): 20 min ? ?Assessment / Plan / Recommendation ?Clinical Impression ? [name] Small was seen at bedside for skilled dysphagia treatment. Pt was asleep prior to SLP entry into the room, but awakened to verbal stimuli and agreed to speech therapy. Pt required mod assist during today's PO trials to engage in assisted self-feeding behaviors. Pt communicated wants/needs effectively throughout the duration of today's session. Pt tolerated individual and consecutive straw sips of orange juice with no overt s/s of aspiration observed. SLP incorporated diced pears into session, and pt continued success. Pt continued to demonstrate mildly weak and prolonged mastication, but was able to clear oral cavity effectively. Pt appears to be at baseline level of functioning in regards to feeding and swallowing. She will continue to require total assist at mealtimes in order to engage in self-feeding behaviors. SLP will sign off as patient has returned to previous level of functioning and diet recommendations remain appropriate.  ?  ?HPI HPI: [name] Small is a 88 y.o. female with medical history significant of chronic A-fib on Xarelto, sick sinus syndrome status post pacemaker, CKD stage IIIa, hypertension, hyperlipidemia, GERD, vertebral compression.  Recently admitted 08/10/2021-08/12/2021 for acute metabolic encephalopathy secondary to UTI. Patient presented with slurred speech, facial droop, and left-sided weakness and found down. Found to have right femoral neck fracture. CT no acute infarct. MRI refvealed acute left  frontal and parietal infarcts. ?  ?   ?SLP Plan ? All goals met ? ?  ?  ?Recommendations for follow up therapy are one component of a multi-disciplinary discharge planning process, led by the attending physician.  Recommendations  may be updated based on patient status, additional functional criteria and insurance authorization. ?  ? ?Recommendations  ?Diet recommendations: Dysphagia 3 (mechanical soft);Thin liquid ?Liquids provided via: Straw ?Medication Administration: Crushed with puree ?Supervision: Staff to assist with self feeding ?Compensations: Slow rate;Small sips/bites ?Postural Changes and/or Swallow Maneuvers: Seated upright 90 degrees  ?   ?    ?   ? ? ? ? Oral Care Recommendations: Oral care BID ?Follow Up Recommendations: Skilled nursing-short term rehab (<3 hours/day) ?Assistance recommended at discharge: Frequent or constant Supervision/Assistance ?SLP Visit Diagnosis: Dysphagia, unspecified (R13.10) ?Plan: All goals met ? ? ? ? ?  ?  ? ? ?[name] Small ? ?08/23/2021, 11:18 AM ?

## 2021-08-23 NOTE — TOC Initial Note (Signed)
Transition of Care (TOC) - Initial/Assessment Note  ? ? ?Patient Details  ?Name: [name] Small ?MRN: [id number] ?Date of Birth: 02/18/1929 ? ?Transition of Care [facility]) CM/SW Contact:    ?Geralynn Ochs, LCSW ?Phone Number: ?08/23/2021, 11:31 AM ? ?Clinical Narrative:       CSW spoke with patient's son, Elenore Rota, about recommendation for SNF. Son in agreement, indicated that the family was considering either [facility] or [facility]. CSW sent referrals, confirmed bed offers at both SNFs. CSW updated son and he asked for time to discuss with family and call back, was leaning towards [facility] as patient had been there before and the family knows the Scientist, physiological. Son called back after discussing with family and patient and they changed their mind to go with [facility] instead. CSW initiated insurance authorization request for SNF, waiting on authorization. CSW to follow.          ? ? ?Expected Discharge Plan: [place] ?Barriers to Discharge: Continued Medical Work up, Ship broker ? ? ?Patient Goals and CMS Choice ?Patient states their goals for this hospitalization and ongoing recovery are:: patient unable to participate in goal setting, not fully oriented ?CMS Medicare.gov Compare Post Acute Care list provided to:: Patient Represenative (must comment) ?Choice offered to / list presented to : Adult Children ? ?Expected Discharge Plan and Services ?Expected Discharge Plan: [place] ?  ?  ?Post Acute Care Choice: [place] ?Living arrangements for the past 2 months: [place] ?                ?  ?  ?  ?  ?  ?  ?  ?  ?  ?  ? ?Prior Living Arrangements/Services ?Living arrangements for the past 2 months: [place] ?Lives with:: Self ?Patient language and need for interpreter reviewed:: No ?Do you feel safe going back to the place where you live?: Yes      ?Need for Family Participation in Patient Care: Yes (Comment) ?Care giver support system  in place?: No (comment) ?Current home services: DME, Home OT, Home PT ?Criminal Activity/Legal Involvement Pertinent to Current Situation/Hospitalization: No - Comment as needed ? ?Activities of Daily Living ?  ?  ? ?Permission Sought/Granted ?Permission sought to share information with : Facility Sport and exercise psychologist, Family Supports ?Permission granted to share information with : Yes, Verbal Permission Granted ? Share Information with NAME: Frances Nickels ? Permission granted to share info w AGENCY: SNF ? Permission granted to share info w Relationship: Sons ?   ? ?Emotional Assessment ?  ?Attitude/Demeanor/Rapport: Unable to Assess ?Affect (typically observed): Unable to Assess ?Orientation: : Oriented to Self, Oriented to Place ?Alcohol / Substance Use: Not Applicable ?Psych Involvement: No (comment) ? ?Admission diagnosis:  Acute ischemic stroke [facility]) [I63.9] ?Closed fracture of right hip, initial encounter ([place]) [S72.001A] ?Acute CVA (cerebrovascular accident) ([place]) [I63.9] ?Patient Active Problem List  ? Diagnosis Date Noted  ? Acute postoperative anemia due to expected blood loss 08/23/2021  ? Closed fracture of multiple pubic rami, right, initial encounter ([place]), insufficiency fractures  08/22/2021  ? Vitamin D deficiency 08/21/2021  ? Hypokalemia 08/20/2021  ? Hip fracture ([place]) 08/18/2021  ? Leukocytosis 08/18/2021  ? Sick sinus syndrome ([place]) 08/18/2021  ? Acute CVA (cerebrovascular accident) ([place]) 08/17/2021  ? UTI (urinary tract infection) 08/10/2021  ? Altered mental status 08/10/2021  ? GERD (gastroesophageal reflux disease) 08/10/2021  ? Syncope 04/06/2021  ? Acute renal failure superimposed on stage  3b chronic kidney disease ([place]) 04/06/2021  ? COVID-19 virus infection 04/06/2021  ? Compression fracture of L2 ([place]) 03/17/2021  ? Lumbar compression fracture ([place]) 03/17/2021  ? Hypertension   ? Low back pain 03/16/2021  ? Hypertensive urgency 03/16/2021  ? PAF (paroxysmal atrial fibrillation) ([place])  03/16/2021  ? Compression fracture of L2 lumbar vertebra (HCC)   ? Porokeratosis 05/02/2019  ? Hav (hallux abducto valgus), unspecified laterality 05/02/2019  ? Atrial fibrillation, chronic ([place]) 11/25/2015  ? Hyperglycemia 12/07/2014  ? Atrial fibrillation with RVR-CHADs VASc=4 12/07/2014  ? Intertrochanteric fracture of left hip ([place]) 07/08/2014  ? Overactive bladder 04/02/2012  ? Essential hypertension 04/02/2012  ? Chest pain, atypical 09/17/2011  ? Dehydration 09/16/2011  ? Hyperlipidemia 11/22/2006  ? DIVERTICULOSIS, COLON 11/22/2006  ? Osteoporosis 11/22/2006  ? ?PCP:  Billie Ruddy, MD ?Pharmacy:   ?[facility] ?[place] ?[place] [address] ?Phone: [phone] Fax: [phone] ? ? ? ? ?Social Determinants of Health (SDOH) Interventions ?  ? ?Readmission Risk Interventions ? ?  08/11/2021  ? 10:49 AM  ?Readmission Risk Prevention Plan  ?Transportation Screening Complete  ?[place] or Home Care Consult Complete  ?Social Work Consult for [place] Planning/Counseling Complete  ?Palliative Care Screening Not Applicable  ?Medication Review Press photographer) Complete  ? ? ? ?

## 2021-08-23 NOTE — Progress Notes (Signed)
?PROGRESS NOTE ? ? ? ?[name] Small  FXT:[id number] DOB: 1928/07/26 DOA: 08/17/2021 ?PCP: Billie Ruddy, MD  ? ? ?Brief Narrative:  ?[name] Small is a 88 year old female with past medical history significant for chronic atrial fibrillation on Xarelto, sick sinus syndrome s/p PPM, CKD stage IIIa, essential hypertension, HLD, GERD, vertebral compression fracture s/p vertebroplasty 02/2021 who presented to [facility] ED on 3/29 via EMS as a code stroke for evaluation of slurred speech, facial droop, left-sided weakness.  Last known normal at 1503/29 and was found down on the floor by family at 76.  Patient complained of right hip pain, unable to ambulate.  Patient was confused on ED presentation and stated that she was walking using her walker and fell; but is unsure how this occurred.  Reporting pain in her right hip.  Also reports dysuria.  Denies chest pain or shortness of breath.  Son states he was with the patient and she appeared normal total 1500; then he went out to get food and when he returned at 1530, she was on the floor and appeared confused with slurred speech, right leg pain and facial droop.  She was unable to get up from the floor. ? ?Patient was recently admitted 08/10/2021 through 08/12/2021 for acute metabolic encephalopathy secondary to UTI treated with vancomycin and Rocephin.  Patient was transition to nitrofurantoin based on prior cultures with plan outpatient urology follow-up due to recurrent UTIs. ? ?In the ED, temperature 98.0 ?F, HR 72, RR 16, BP 153/76, SPO2 93% on room air.  Sodium 137, potassium 3.8, chloride 103, CO2 25, glucose 144, BUN 15, creatinine 1.22, AST 37, ALT 35.  WBC 19.6, hemoglobin 14.3, platelets 208.  INR 2.9.  TSH 1.864.  EtOH level less than 10.  Chest x-ray with no acute cardiopulmonary disease process.  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  CTA head/neck with decreased  perfusion posterior left MCA, moderate narrowing distal left M1 segment, multifocal narrowing bilateral PCAs, 50% proximal left ICA stenosis, unchanged.  X-ray right hip/pelvis with acute displaced right femoral neck fracture.  Neurology and orthopedics were consulted.  Patient was not a tPA candidate due to chronic anticoagulation on Xarelto.  Hospital service consulted for further evaluation and management of hip fracture and concern for acute CVA.  ? ? ?Assessment and Plan: ?* Acute CVA (cerebrovascular accident) ([place]) ?Patient presented to the ED with acute onset slurred speech, facial droop, and left-sided weakness.  Last known well at 1500 on 08/17/2021. tPA not administered as she is chronically anticoagulated. 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 CT angio head and neck decreased perfusion posterior left MCA, mild to moderate narrowing in the distal left M1 segment, no additional focal stenosis or occlusion is seen in the left MCA branches, multifocal narrowing in the bilateral PCAs. No other ?hemodynamically significant intracranial stenosis, 50% proximal left ICA stenosis, unchanged, no additional hemodynamically significant stenosis in the neck.  LDL 67.  Hemoglobin A1c 5.7. MR brain with acute left frontal and parietal infarcts.  TTE with LVEF greater than 75%, LV with no regional wall motion normalities, mild LVH, grade 1 diastolic dysfunction, trivial MR, no aortic stenosis, IVC normal in size, no evidence of intra-atrial shunt. ?--Neurology following, appreciate assistance ?--Aspirin 81 mg p.o. daily ?--Xarelto '15mg'$  PO daily ?--Monitor on telemetry ?--Seen by SLP ?--PT/OT recommend SNF placement ?--Continue therapy efforts while inpatient ?--SNF placement per  TOC ? ?Hip fracture ([place]) ?Patient with unwitnessed fall at home with x-ray notable for acute displaced right femoral neck fracture.  Underwent right hemiarthroplasty on 08/20/2021 by Dr. Marcelino Scot.  Vitamin D  25-hydroxy 12.78. ?--Orthopedics following, appreciate assistance ?--home Xarelto for DVT PPX ?--Oxycodone 5 mg p.o. q6h PRN moderate pain ?--Morphine 1 mg IV every 4 hours as needed severe pain ?--Robaxin '500mg'$  PO q6h PRN muscle spasm ?--Started on vitamin D3 2000 units twice daily ?--WBAT with posterior hip precautions ?--PT/OT recommend SNF placement; per TOC ? ? ?Essential hypertension ?Home medications include metoprolol tartrate 37.5 mg p.o. twice daily ?--Resumed home metoprolol 37.'5mg'$  BID ? ?Atrial fibrillation, chronic ([place]) ?--Amiodarone 200 mg p.o. daily ?--metoprolol tartrate 37.5 mg p.o. twice daily  ?--Xarelto '15mg'$  PO daily ?--Monitor on telemetry ? ?Acute renal failure superimposed on stage 3b chronic kidney disease ([place]) ?Creatinine 1.2 on admission with baseline 0.8-1.0.  Was given a 500 mL IV fluid bolus in ED. ?--Cr 1.2>0.95>1.04>0.82>0.67>0.92 ?--Avoid nephrotoxins, renal dose all medications ?--BMP daily ? ?Leukocytosis ?Patient is afebrile, but history of recurrent UTIs.  Unsure if this is related to recurrent UTI or stress reaction from fracture ?--WBC 19.6>23.5>12.1>10.4>9.1>11.4>10.0 ?--Urine culture with insignificant growth ?--Completed 5-day course of IV ceftriaxone ?--CBC daily ? ?Sick sinus syndrome ([place]) ?Status post PPM ?--Monitor on telemetry ? ?Acute postoperative anemia due to expected blood loss ?Hemoglobin 14.3 on admission.  Hemoglobin slowly drifted down to 7.6 this morning.  Etiology likely secondary to postoperative anemia coupled with blood loss from long bone fracture.  Also complicated by anticoagulants with Xarelto for underlying A-fib with surgical dressing saturated in blood today. ?--Check anemia panel ?--Transfuse 1 unit PRBC today ?--Discussed with nursing, will page Ortho for evaluation ?--CBC daily ? ?Vitamin D deficiency ?Vitamin D 25-hydroxy 12.78, low.  Started on vitamin D3 2000 units twice daily. ? ?Hypokalemia ?Potassium 3.9 this morning. ?-- Follow  electrolytes daily ? ? ? ?DVT prophylaxis: SCDs Start: 08/17/21 2227 ?Rivaroxaban (XARELTO) tablet 15 mg  ?  Code Status: DNR ?Family Communication: No family present at bedside this morning ? ?Disposition Plan:  ?Level of care: Telemetry Medical ?Status is: Inpatient ?Remains inpatient appropriate because: Pending SNF placement ? ?Consultants:  ?Neurology ?Orthopedics ? ?Procedures:  ?TTE:  ?Right hip hemiarthroplasty 4/1, Dr. Marcelino Scot ? ?Antimicrobials:  ?Ceftriaxone 3/30 - 4/3 ? ? ?Subjective: ?Patient seen examined bedside, resting comfortably.  Pain controlled.  Noted saturated surgical dressing with blood this morning and hemoglobin now down to 7.6.  Will transfuse 1 unit PRBC, check anemia panel.  Discussed with primary RN who will page orthopedics for evaluation.  Pending SNF placement.  No other complaints or concerns at this time.  Denies headache, no chest pain, no palpitations, no shortness of breath, no abdominal pain.  No acute events overnight per nursing staff. ? ?Objective: ?Vitals:  ? 08/22/21 2146 08/22/21 2300 08/23/21 0300 08/23/21 0858  ?BP: (!) 139/51 (!) 122/44 (!) 130/53 (!) 148/56  ?Pulse: 61 60 61 61  ?Resp:  '20 19 20  '$ ?Temp:  98.5 ?F (36.9 ?C) 98.6 ?F (37 ?C) 98.7 ?F (37.1 ?C)  ?TempSrc:  Oral Oral Oral  ?SpO2:  94% 95% 93%  ?Weight:      ?Height:      ? ? ?Intake/Output Summary (Last 24 hours) at 08/23/2021 0949 ?Last data filed at 08/23/2021 0315 ?Gross per 24 hour  ?Intake 140 ml  ?Output 800 ml  ?Net -660 ml  ? ?Filed Weights  ? 08/17/21 1800 08/20/21 0711  ?Weight: 48.6 kg  48.6 kg  ? ? ?Examination: ? ?Physical Exam: ?GEN: NAD, alert, elderly in appearance ?HEENT: NCAT, PERRL, EOMI, sclera clear, MMM ?PULM: CTAB w/o wheezes/crackles, normal respiratory effort, on room air ?CV: RRR w/o M/G/R ?GI: abd soft, NTND, NABS, no R/G/M ?MSK: no peripheral edema, right lower extremity with surgical dressing in place, saturated in blood ?NEURO: Cranial nerves II through XII grossly intact, left-sided  weakness noted but moves all extremities independently, fluent speech and no obvious facial droop ?PSYCH: normal mood/affect ?Integumentary: dry/intact, no rashes or wounds ? ? ? ?Data Reviewed: I have personally reviewed f

## 2021-08-23 NOTE — Plan of Care (Signed)
?  Problem: Health Behavior/Discharge Planning: ?Goal: Ability to manage health-related needs will improve ?Outcome: Progressing ?  ?Problem: Clinical Measurements: ?Goal: Ability to maintain clinical measurements within normal limits will improve ?Outcome: Progressing ?Goal: Will remain free from infection ?Outcome: Progressing ?Goal: Diagnostic test results will improve ?Outcome: Progressing ?Goal: Respiratory complications will improve ?Outcome: Progressing ?Goal: Cardiovascular complication will be avoided ?Outcome: Progressing ?  ?Problem: Activity: ?Goal: Risk for activity intolerance will decrease ?Outcome: Progressing ?  ?Problem: Elimination: ?Goal: Will not experience complications related to bowel motility ?Outcome: Progressing ?Goal: Will not experience complications related to urinary retention ?Outcome: Progressing ?  ?

## 2021-08-23 NOTE — Progress Notes (Signed)
MD notified of patients low diastolic pressure. 122/44. Patients diastolic pressure has been low since admission. No new orders will continue to monitor.  ?

## 2021-08-23 NOTE — Plan of Care (Signed)
?  Problem: Education: ?Goal: Knowledge of disease or condition will improve ?Outcome: Progressing ?  ?Problem: Coping: ?Goal: Will identify appropriate support needs ?Outcome: Progressing ?  ?Problem: Self-Care: ?Goal: Verbalization of feelings and concerns over difficulty with self-care will improve ?Outcome: Progressing ?Goal: Ability to communicate needs accurately will improve ?Outcome: Progressing ?  ?

## 2021-08-23 NOTE — Progress Notes (Addendum)
? ?                              Orthopaedic Trauma Service Progress Note ? ?Patient ID: ?[name] Small ?MRN: [id number] ?DOB/AGE: Sep 05, 1928 88 y.o. ? ?Subjective: ? ?Nursing reported moderate drainage from R hip, dressing changed  ?Ortho issues o/w stable ? ?ROS ?As above ? ?Objective:  ? ?VITALS:   ?Vitals:  ? 08/23/21 0858 08/23/21 1008 08/23/21 1015 08/23/21 1025  ?BP: (!) 148/56 (!) 155/75 (!) 155/75 (!) 154/61  ?Pulse: 61 75 64 (!) 59  ?Resp: 20 17 (!) 24 18  ?Temp: 98.7 ?F (37.1 ?C) 97.8 ?F (36.6 ?C)  98 ?F (36.7 ?C)  ?TempSrc: Oral Oral  Oral  ?SpO2: 93% 92% 94% 94%  ?Weight:      ?Height:      ? ? ?Estimated body mass index is 17.83 kg/m? as calculated from the following: ?  Height as of this encounter: '5\' 5"'$  (1.651 m). ?  Weight as of this encounter: 48.6 kg. ? ? ?Intake/Output   ?   04/03 0701 ?04/04 0700 04/04 0701 ?04/05 0700  ? P.O. 240   ? IV Piggyback    ? Total Intake(mL/kg) 240 (4.9)   ? Urine (mL/kg/hr) 800 (0.7)   ? Total Output 800   ? Net -560   ?     ?  ? ?LABS ? ?Results for orders placed or performed during the hospital encounter of 08/17/21 (from the past 24 hour(s))  ?CBC     Status: Abnormal  ? Collection Time: 08/23/21  4:11 AM  ?Result Value Ref Range  ? WBC 10.0 4.0 - 10.5 K/uL  ? RBC 2.56 (L) 3.87 - 5.11 MIL/uL  ? Hemoglobin 7.6 (L) 12.0 - 15.0 g/dL  ? HCT 23.8 (L) 36.0 - 46.0 %  ? MCV 93.0 80.0 - 100.0 fL  ? MCH 29.7 26.0 - 34.0 pg  ? MCHC 31.9 30.0 - 36.0 g/dL  ? RDW 14.6 11.5 - 15.5 %  ? Platelets 167 150 - 400 K/uL  ? nRBC 0.3 (H) 0.0 - 0.2 %  ?Basic metabolic panel     Status: Abnormal  ? Collection Time: 08/23/21  4:11 AM  ?Result Value Ref Range  ? Sodium 138 135 - 145 mmol/L  ? Potassium 3.9 3.5 - 5.1 mmol/L  ? Chloride 105 98 - 111 mmol/L  ? CO2 26 22 - 32 mmol/L  ? Glucose, Bld 104 (H) 70 - 99 mg/dL  ? BUN 21 8 - 23 mg/dL  ? Creatinine, Ser 0.92 0.44 - 1.00 mg/dL  ? Calcium 8.3 (L) 8.9 - 10.3 mg/dL  ? GFR, Estimated 59 (L) >60 mL/min  ?  Anion gap 7 5 - 15  ?Reticulocytes     Status: Abnormal  ? Collection Time: 08/23/21  4:11 AM  ?Result Value Ref Range  ? Retic Ct Pct 2.3 0.4 - 3.1 %  ? RBC. 2.54 (L) 3.87 - 5.11 MIL/uL  ? Retic Count, Absolute 59.4 19.0 - 186.0 K/uL  ? Immature Retic Fract 28.9 (H) 2.3 - 15.9 %  ?Prepare RBC (crossmatch)     Status: None  ? Collection Time: 08/23/21  9:25 AM  ?Result Value Ref Range  ? Order Confirmation    ?  ORDER PROCESSED BY BLOOD BANK ?Performed at [facility], [place] [address], [place], [place] [address] ?  ? ? ? ?PHYSICAL EXAM:  ? ?Gen: resting  comfortably in chair, NAD ?Ext:  ?     Right Lower Extremity  ?            incision looks great to R hip  ?  Some bloody drainage noted ?  No active bleeding  ?  No erythema  ?  Overall looks stable  ?            Ext warm  ?            No DCT  ?            Compartments are soft  ?            Distal motor and sensory functions intact ? Right foot with a fair amount of ecchymosis ? Foot seems to be nontender  ?  ? ?Assessment/Plan: ?3 Days Post-Op  ? ?Principal Problem: ?  Acute CVA (cerebrovascular accident) ([place]) ?Active Problems: ?  Essential hypertension ?  Atrial fibrillation, chronic ([place]) ?  Acute renal failure superimposed on stage 3b chronic kidney disease ([place]) ?  Hip fracture ([place]) ?  Leukocytosis ?  Sick sinus syndrome ([place]) ?  Hypokalemia ?  Vitamin D deficiency ?  Closed fracture of multiple pubic rami, right, initial encounter ([place]), insufficiency fractures  ?  Acute postoperative anemia due to expected blood loss ? ? ?Anti-infectives (From admission, onward)  ? ? Start     Dose/Rate Route Frequency Ordered Stop  ? 08/20/21 2200  ceFAZolin (ANCEF) IVPB 2g/100 mL premix       ? 2 g ?200 mL/hr over 30 Minutes Intravenous Every 12 hours 08/20/21 1606 08/21/21 1130  ? 08/20/21 0730  ceFAZolin (ANCEF) IVPB 2g/100 mL premix  Status:  Discontinued       ? 2 g ?200 mL/hr over 30 Minutes Intravenous  Once 08/19/21 1115 08/21/21 1358  ? 08/18/21 1230   cefTRIAXone (ROCEPHIN) 1 g in sodium chloride 0.9 % 100 mL IVPB       ? 1 g ?200 mL/hr over 30 Minutes Intravenous Every 24 hours 08/18/21 1136 08/22/21 1228  ? ?  ?. ? ?POD/HD#: 63 ? ?88 year old female ground-level fall with right femoral neck fracture ?  ?-Ground-level fall ?  ?-Right femoral neck fracture treated with right hip hemiarthroplasty  ?            Weight-bear as tolerated right leg with assistance ?            Posterior hip precautions ?            PT and OT ?            Okay to discontinue abduction pillow ?            Dressing changes as needed  ?  New aquacel applied ?  Not surprised with continued oozing as she is on aspirin and xarelto ? ? Ice to R hip  ? ? Sutures out around 09/03/2021 ?  ?- R foot ecchymosis  ? Xrays  ?  ?- Pain management: ?            Minimize narcotics  ?            Recommend tylenol first then opioid if pain uncontrolled ?  ?- ABL anemia/Hemodynamics ?            Expected blood loss with injury and surgery  ?            getting another unit of PRBCs today  ?  ?-  Medical issues  ?            Per primary and neurology  ?  ?- DVT/PE prophylaxis: ?            Home anticoagulation resumed ?  ?- ID:  ?            Periop abx ?  ?- Metabolic Bone Disease: ?            Vitamin d deficiency  ?                        Supplement  ?  ?- Activity: ?            As above ?  ?- Impediments to fracture healing: ?            Fragility fracture  ?            Vitamin d deficiency  ?  ?- Dispo: ?            Therapy evals  ?            TOC for SNF  ?            Ortho issues stable, follow up with ortho in 10-14 days          ?  ? ? ?Jari Pigg, PA-C ?[phone] (C) ?08/23/2021, 11:10 AM ? ?[facility] ?[place] [address] ?[phone] Jenetta Downer) ?[phone] (F) ? ? ? ?After 5pm and on the weekends please log on to Amion, go to orthopaedics and the look under the [facility] Call for the provider(s) on call. You can also call our office at [phone]  and then follow the prompts to be connected to the call team.  ? Patient ID: [name] Small, female   DOB: 18-Mar-1929, 88 y.o.   MRN: [id number] ? ?

## 2021-08-23 NOTE — Consult Note (Signed)
[facility] CM Inpatient Consult ? ? ?08/23/2021 ? ?[name] Small ?Jul 07, 1928 ?[id number] ? ?[place] Management [facility] CM) ? ?Primary Care Provider:  Billie Ruddy, MD, Ruma Jacklynn Ganong  is an embedded provider with a Chronic Care Management team and program, and is listed for the transition of care follow up and appointments.  ?  ?Patient was reviewed for less than 30 days readmission and noted high risk score for unplanned readmission. ?  ?Patient is being recommended for a skilled nursing facility level of care for transition. ?  ?Plan: If patient transitions to a skilled nursing level of care then post hospital transition needs are to be met at that level of care. ? ?Of note, [facility] Care Management services does not replace or interfere with any services that are arranged by inpatient case management or social work.  ? ?[name], MSN, RN ?[facility] Liaison ?Phone [phone] ?Toll free office [phone]  ?

## 2021-08-23 NOTE — Assessment & Plan Note (Addendum)
-  Hemoglobin 14.3 on admission.   ?-Hemoglobin slowly drifted down to 7.6 yesterday morning.   ?-Etiology likely secondary to postoperative anemia coupled with blood loss from long bone fracture.   ?-Also complicated by anticoagulants with Xarelto for underlying A-fib with surgical dressing saturated in blood today. ?-Checked Anemia Panel and showed an iron level of 22, U IBC 163, TIBC of 183, saturation ratios of 12%, ferritin level 234, folate of 7.3, vitamin B12 0204 ?-Transfuserf 1 unit PRBC yesterady ?-Orthopedic surgery felt this was expected drop ?-After transfusion of the 1 unit of PRBCs her hemoglobin/hematocrit went to 9.4/28.7 is now stable at 9.1/20.0 ?-Repeat CBC within 1 week ?

## 2021-08-24 DIAGNOSIS — S72001D Fracture of unspecified part of neck of right femur, subsequent encounter for closed fracture with routine healing: Secondary | ICD-10-CM | POA: Diagnosis not present

## 2021-08-24 DIAGNOSIS — I69328 Other speech and language deficits following cerebral infarction: Secondary | ICD-10-CM | POA: Diagnosis not present

## 2021-08-24 DIAGNOSIS — S72001A Fracture of unspecified part of neck of right femur, initial encounter for closed fracture: Secondary | ICD-10-CM | POA: Diagnosis not present

## 2021-08-24 DIAGNOSIS — I63412 Cerebral infarction due to embolism of left middle cerebral artery: Secondary | ICD-10-CM | POA: Diagnosis not present

## 2021-08-24 DIAGNOSIS — N179 Acute kidney failure, unspecified: Secondary | ICD-10-CM | POA: Diagnosis not present

## 2021-08-24 DIAGNOSIS — M6281 Muscle weakness (generalized): Secondary | ICD-10-CM | POA: Diagnosis not present

## 2021-08-24 DIAGNOSIS — E782 Mixed hyperlipidemia: Secondary | ICD-10-CM | POA: Diagnosis not present

## 2021-08-24 DIAGNOSIS — Z950 Presence of cardiac pacemaker: Secondary | ICD-10-CM | POA: Diagnosis not present

## 2021-08-24 DIAGNOSIS — L89301 Pressure ulcer of unspecified buttock, stage 1: Secondary | ICD-10-CM | POA: Diagnosis not present

## 2021-08-24 DIAGNOSIS — S32591A Other specified fracture of right pubis, initial encounter for closed fracture: Secondary | ICD-10-CM | POA: Diagnosis not present

## 2021-08-24 DIAGNOSIS — Z9181 History of falling: Secondary | ICD-10-CM | POA: Diagnosis not present

## 2021-08-24 DIAGNOSIS — I639 Cerebral infarction, unspecified: Secondary | ICD-10-CM | POA: Diagnosis not present

## 2021-08-24 DIAGNOSIS — Z7401 Bed confinement status: Secondary | ICD-10-CM | POA: Diagnosis not present

## 2021-08-24 DIAGNOSIS — E876 Hypokalemia: Secondary | ICD-10-CM | POA: Diagnosis not present

## 2021-08-24 DIAGNOSIS — R5383 Other fatigue: Secondary | ICD-10-CM | POA: Diagnosis not present

## 2021-08-24 DIAGNOSIS — R0689 Other abnormalities of breathing: Secondary | ICD-10-CM | POA: Diagnosis not present

## 2021-08-24 DIAGNOSIS — R404 Transient alteration of awareness: Secondary | ICD-10-CM | POA: Diagnosis not present

## 2021-08-24 DIAGNOSIS — Z741 Need for assistance with personal care: Secondary | ICD-10-CM | POA: Diagnosis not present

## 2021-08-24 DIAGNOSIS — I482 Chronic atrial fibrillation, unspecified: Secondary | ICD-10-CM | POA: Diagnosis not present

## 2021-08-24 DIAGNOSIS — S3282XD Multiple fractures of pelvis without disruption of pelvic ring, subsequent encounter for fracture with routine healing: Secondary | ICD-10-CM | POA: Diagnosis not present

## 2021-08-24 DIAGNOSIS — E559 Vitamin D deficiency, unspecified: Secondary | ICD-10-CM

## 2021-08-24 DIAGNOSIS — D72829 Elevated white blood cell count, unspecified: Secondary | ICD-10-CM | POA: Diagnosis not present

## 2021-08-24 DIAGNOSIS — E785 Hyperlipidemia, unspecified: Secondary | ICD-10-CM | POA: Diagnosis not present

## 2021-08-24 DIAGNOSIS — N1832 Chronic kidney disease, stage 3b: Secondary | ICD-10-CM | POA: Diagnosis not present

## 2021-08-24 DIAGNOSIS — Z743 Need for continuous supervision: Secondary | ICD-10-CM | POA: Diagnosis not present

## 2021-08-24 DIAGNOSIS — Z961 Presence of intraocular lens: Secondary | ICD-10-CM | POA: Diagnosis not present

## 2021-08-24 DIAGNOSIS — K219 Gastro-esophageal reflux disease without esophagitis: Secondary | ICD-10-CM | POA: Diagnosis not present

## 2021-08-24 DIAGNOSIS — R262 Difficulty in walking, not elsewhere classified: Secondary | ICD-10-CM | POA: Diagnosis not present

## 2021-08-24 DIAGNOSIS — Z8616 Personal history of COVID-19: Secondary | ICD-10-CM | POA: Diagnosis not present

## 2021-08-24 DIAGNOSIS — D62 Acute posthemorrhagic anemia: Secondary | ICD-10-CM | POA: Diagnosis not present

## 2021-08-24 DIAGNOSIS — I10 Essential (primary) hypertension: Secondary | ICD-10-CM

## 2021-08-24 DIAGNOSIS — M4857XD Collapsed vertebra, not elsewhere classified, lumbosacral region, subsequent encounter for fracture with routine healing: Secondary | ICD-10-CM | POA: Diagnosis not present

## 2021-08-24 DIAGNOSIS — I495 Sick sinus syndrome: Secondary | ICD-10-CM | POA: Diagnosis not present

## 2021-08-24 DIAGNOSIS — R29818 Other symptoms and signs involving the nervous system: Secondary | ICD-10-CM | POA: Diagnosis not present

## 2021-08-24 DIAGNOSIS — I480 Paroxysmal atrial fibrillation: Secondary | ICD-10-CM | POA: Diagnosis not present

## 2021-08-24 DIAGNOSIS — R55 Syncope and collapse: Secondary | ICD-10-CM | POA: Diagnosis not present

## 2021-08-24 DIAGNOSIS — R2681 Unsteadiness on feet: Secondary | ICD-10-CM | POA: Diagnosis not present

## 2021-08-24 DIAGNOSIS — S72031D Displaced midcervical fracture of right femur, subsequent encounter for closed fracture with routine healing: Secondary | ICD-10-CM | POA: Diagnosis not present

## 2021-08-24 DIAGNOSIS — S72142D Displaced intertrochanteric fracture of left femur, subsequent encounter for closed fracture with routine healing: Secondary | ICD-10-CM | POA: Diagnosis not present

## 2021-08-24 DIAGNOSIS — M5459 Other low back pain: Secondary | ICD-10-CM | POA: Diagnosis not present

## 2021-08-24 DIAGNOSIS — Z96642 Presence of left artificial hip joint: Secondary | ICD-10-CM | POA: Diagnosis not present

## 2021-08-24 DIAGNOSIS — E538 Deficiency of other specified B group vitamins: Secondary | ICD-10-CM | POA: Diagnosis not present

## 2021-08-24 LAB — BPAM RBC
Blood Product Expiration Date: 202305022359
ISSUE DATE / TIME: 202304041003
Unit Type and Rh: 5100

## 2021-08-24 LAB — CBC
HCT: 27 % — ABNORMAL LOW (ref 36.0–46.0)
Hemoglobin: 9.1 g/dL — ABNORMAL LOW (ref 12.0–15.0)
MCH: 30.3 pg (ref 26.0–34.0)
MCHC: 33.7 g/dL (ref 30.0–36.0)
MCV: 90 fL (ref 80.0–100.0)
Platelets: 154 10*3/uL (ref 150–400)
RBC: 3 MIL/uL — ABNORMAL LOW (ref 3.87–5.11)
RDW: 16 % — ABNORMAL HIGH (ref 11.5–15.5)
WBC: 9.2 10*3/uL (ref 4.0–10.5)
nRBC: 0.2 % (ref 0.0–0.2)

## 2021-08-24 LAB — TYPE AND SCREEN
ABO/RH(D): O POS
Antibody Screen: NEGATIVE
Unit division: 0

## 2021-08-24 LAB — BASIC METABOLIC PANEL
Anion gap: 7 (ref 5–15)
BUN: 17 mg/dL (ref 8–23)
CO2: 25 mmol/L (ref 22–32)
Calcium: 8.1 mg/dL — ABNORMAL LOW (ref 8.9–10.3)
Chloride: 104 mmol/L (ref 98–111)
Creatinine, Ser: 0.76 mg/dL (ref 0.44–1.00)
GFR, Estimated: >60 mL/min (ref >60)
Glucose, Bld: 104 mg/dL — ABNORMAL HIGH (ref 70–99)
Potassium: 3.8 mmol/L (ref 3.5–5.1)
Sodium: 136 mmol/L (ref 135–145)

## 2021-08-24 MED ORDER — ASPIRIN 81 MG PO CHEW: 81.0000 mg | CHEWABLE_TABLET | Freq: Every day | ORAL | 0 refills | Status: AC

## 2021-08-24 MED ORDER — ASCORBIC ACID 500 MG PO TABS: 500.0000 mg | ORAL_TABLET | Freq: Every day | ORAL | 0 refills | Status: DC

## 2021-08-24 MED ORDER — METHOCARBAMOL 500 MG PO TABS: 500.0000 mg | ORAL_TABLET | Freq: Four times a day (QID) | ORAL | Status: DC | PRN

## 2021-08-24 MED ORDER — VITAMIN D3 25 MCG PO TABS
2000.0000 [IU] | ORAL_TABLET | Freq: Two times a day (BID) | ORAL | 0 refills | Status: DC
Start: 2021-08-24 — End: 2021-09-22

## 2021-08-24 MED ORDER — OXYCODONE HCL 5 MG PO TABS: 5.0000 mg | ORAL_TABLET | Freq: Four times a day (QID) | ORAL | 0 refills | Status: DC | PRN

## 2021-08-24 MED ORDER — CALCIUM CITRATE 950 (200 CA) MG PO TABS: 200.0000 mg | ORAL_TABLET | Freq: Two times a day (BID) | ORAL | Status: DC

## 2021-08-24 NOTE — TOC Progression Note (Signed)
Transition of Care (TOC) - Progression Note  ? ? ?Patient Details  ?Name: [name] Small ?MRN: [id number] ?Date of Birth: March 08, 1929 ? ?Transition of Care (TOC) CM/SW Contact  ?Geralynn Ochs, LCSW ?Phone Number: ?08/24/2021, 12:31 PM ? ?Clinical Narrative:   CSW received insurance authorization on patient, but not medically stable for discharge today. CSW notified [facility]. Authorization approved 4/4 - 4/6, next review 4/6. Reference ID: [id number]. Plan Auth ID: [id number]. CSW to follow. ? ? ? ? ?Expected Discharge Plan: [place] ?Barriers to Discharge: Continued Medical Work up ? ?Expected Discharge Plan and Services ?Expected Discharge Plan: [place] ?  ?  ?Post Acute Care Choice: [place] ?Living arrangements for the past 2 months: [place] ?Expected Discharge Date: 08/24/21               ?  ?  ?  ?  ?  ?  ?  ?  ?  ?  ? ? ?Social Determinants of Health (SDOH) Interventions ?  ? ?Readmission Risk Interventions ? ?  08/11/2021  ? 10:49 AM  ?Readmission Risk Prevention Plan  ?Transportation Screening Complete  ?[place] or Home Care Consult Complete  ?Social Work Consult for [place] Planning/Counseling Complete  ?Palliative Care Screening Not Applicable  ?Medication Review Press photographer) Complete  ? ? ?

## 2021-08-24 NOTE — Plan of Care (Signed)
?  Problem: Education: ?Goal: Knowledge of General Education information will improve ?Description: Including pain rating scale, medication(s)/side effects and non-pharmacologic comfort measures ?Outcome: Adequate for Discharge ?  ?Problem: Health Behavior/Discharge Planning: ?Goal: Ability to manage health-related needs will improve ?Outcome: Adequate for Discharge ?  ?Problem: Clinical Measurements: ?Goal: Ability to maintain clinical measurements within normal limits will improve ?Outcome: Adequate for Discharge ?Goal: Will remain free from infection ?Outcome: Adequate for Discharge ?Goal: Diagnostic test results will improve ?Outcome: Adequate for Discharge ?Goal: Respiratory complications will improve ?Outcome: Adequate for Discharge ?Goal: Cardiovascular complication will be avoided ?Outcome: Adequate for Discharge ?  ?Problem: Activity: ?Goal: Risk for activity intolerance will decrease ?Outcome: Adequate for Discharge ?  ?Problem: Nutrition: ?Goal: Adequate nutrition will be maintained ?Outcome: Adequate for Discharge ?  ?Problem: Coping: ?Goal: Level of anxiety will decrease ?Outcome: Adequate for Discharge ?  ?Problem: Elimination: ?Goal: Will not experience complications related to bowel motility ?Outcome: Adequate for Discharge ?Goal: Will not experience complications related to urinary retention ?Outcome: Adequate for Discharge ?  ?Problem: Pain Managment: ?Goal: General experience of comfort will improve ?Outcome: Adequate for Discharge ?  ?Problem: Safety: ?Goal: Ability to remain free from injury will improve ?Outcome: Adequate for Discharge ?  ?Problem: Skin Integrity: ?Goal: Risk for impaired skin integrity will decrease ?Outcome: Adequate for Discharge ?  ?Problem: Education: ?Goal: Knowledge of disease or condition will improve ?Outcome: Adequate for Discharge ?Goal: Knowledge of secondary prevention will improve (SELECT ALL) ?Outcome: Adequate for Discharge ?Goal: Knowledge of patient specific  risk factors will improve (INDIVIDUALIZE FOR PATIENT) ?Outcome: Adequate for Discharge ?Goal: Individualized Educational Video(s) ?Outcome: Adequate for Discharge ?  ?Problem: Coping: ?Goal: Will verbalize positive feelings about self ?Outcome: Adequate for Discharge ?Goal: Will identify appropriate support needs ?Outcome: Adequate for Discharge ?  ?Problem: Health Behavior/Discharge Planning: ?Goal: Ability to manage health-related needs will improve ?Outcome: Adequate for Discharge ?  ?Problem: Self-Care: ?Goal: Ability to participate in self-care as condition permits will improve ?Outcome: Adequate for Discharge ?Goal: Verbalization of feelings and concerns over difficulty with self-care will improve ?Outcome: Adequate for Discharge ?Goal: Ability to communicate needs accurately will improve ?Outcome: Adequate for Discharge ?  ?Problem: Nutrition: ?Goal: Risk of aspiration will decrease ?Outcome: Adequate for Discharge ?  ?Problem: Intracerebral Hemorrhage Tissue Perfusion: ?Goal: Complications of Intracerebral Hemorrhage will be minimized ?Outcome: Adequate for Discharge ?  ?Problem: Ischemic Stroke/TIA Tissue Perfusion: ?Goal: Complications of ischemic stroke/TIA will be minimized ?Outcome: Adequate for Discharge ?  ?Problem: Spontaneous Subarachnoid Hemorrhage Tissue Perfusion: ?Goal: Complications of Spontaneous Subarachnoid Hemorrhage will be minimized ?Outcome: Adequate for Discharge ?  ?

## 2021-08-24 NOTE — Progress Notes (Signed)
Discharge report called to [facility] at [phone].  Report given to [place], Therapist, sports.  Transport has been scheduled per [place], [place]. ?

## 2021-08-24 NOTE — Discharge Summary (Signed)
?Physician Discharge Summary ?  ?Patient: [name] Small MRN: [id number] DOB: 04-29-1929  ?Admit date:     08/17/2021  ?Discharge date: 08/24/21  ?Discharge Physician: Raiford Noble, DO   ? ?PCP: Billie Ruddy, MD  ? ?Recommendations at discharge:  ? ? Follow up with PCP within 1-2 weeks and repeat CBC, CMP, Mag, Phos within 1 week ?Follow up with Orthopedic Surgery in 10-14 Days ?Follow up with Neurology in 3-4 weeks ? ?Discharge Diagnoses: ?Principal Problem: ?  Acute CVA (cerebrovascular accident) ([place]) ?Active Problems: ?  Hip fracture ([place]) ?  Essential hypertension ?  Atrial fibrillation, chronic ([place]) ?  Acute renal failure superimposed on stage 3b chronic kidney disease ([place]) ?  Leukocytosis ?  Sick sinus syndrome ([place]) ?  Hypokalemia ?  Vitamin D deficiency ?  Closed fracture of multiple pubic rami, right, initial encounter ([place]), insufficiency fractures  ?  Acute postoperative anemia due to expected blood loss ?  Pressure injury of skin ? ?Resolved Problems: ?  * No resolved hospital problems. * ? ?Hospital Course: ?[name] Small is a 88 year old female with past medical history significant for chronic atrial fibrillation on Xarelto, sick sinus syndrome s/p PPM, CKD stage IIIa, essential hypertension, HLD, GERD, vertebral compression fracture s/p vertebroplasty 02/2021 who presented to [facility] ED on 3/29 via EMS as a code stroke for evaluation of slurred speech, facial droop, left-sided weakness.  Last known normal at 1503/29 and was found down on the floor by family at 48.  Patient complained of right hip pain, unable to ambulate.  Patient was confused on ED presentation and stated that she was walking using her walker and fell; but is unsure how this occurred.  Reporting pain in her right hip.  Also reports dysuria.  Denies chest pain or shortness of breath.  Son states he was with the patient and she appeared normal total 1500; then he went out to get food and when he returned at 1530, she was on the floor and  appeared confused with slurred speech, right leg pain and facial droop.  She was unable to get up from the floor. ? ?Patient was recently admitted 08/10/2021 through 08/12/2021 for acute metabolic encephalopathy secondary to UTI treated with vancomycin and Rocephin.  Patient was transition to nitrofurantoin based on prior cultures with plan outpatient urology follow-up due to recurrent UTIs. ? ?In the ED, temperature 98.0 ?F, HR 72, RR 16, BP 153/76, SPO2 93% on room air.  Sodium 137, potassium 3.8, chloride 103, CO2 25, glucose 144, BUN 15, creatinine 1.22, AST 37, ALT 35.  WBC 19.6, hemoglobin 14.3, platelets 208.  INR 2.9.  TSH 1.864.  EtOH level less than 10.  Chest x-ray with no acute cardiopulmonary disease process.  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  CTA head/neck with decreased perfusion posterior left MCA, moderate narrowing distal left M1 segment, multifocal narrowing bilateral PCAs, 50% proximal left ICA stenosis, unchanged.  X-ray right hip/pelvis with acute displaced right femoral neck fracture.  Neurology and orthopedics were consulted.  Patient was not a tPA candidate due to chronic anticoagulation on Xarelto.  Hospital service consulted for further evaluation and management of hip fracture and concern for acute CVA. ? ?**Interim History ?She underwent a procedure for her right femoral neck fracture and underwent right hip hemiarthroplasty.  Orthopedic surgery recommended weightbearing as tolerated with assistance.  She had an acute blood loss anemia and received  transfusions of PRBCs.  She was placed back on her Xarelto for anticoagulation and was deemed stable from a medical standpoint from a orthopedic and a neurology standpoint.  She will need to follow-up for continued rehabilitative efforts ? ?Assessment and Plan: ?* Acute CVA (cerebrovascular accident) ([place]) ?-Patient presented to the ED  with acute onset slurred speech, facial droop, and left-sided weakness.  ?-Last known well at 1500 on 08/17/2021. tPA not administered as she is chronically anticoagulated. 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 CT angio head and neck decreased perfusion posterior left MCA, mild to moderate narrowing in the distal left M1 segment, no additional focal stenosis or occlusion is seen in the left MCA branches, multifocal narrowing in the bilateral PCAs. No other hemodynamically significant intracranial stenosis, 50% proximal left ICA stenosis, unchanged, no additional hemodynamically significant stenosis in the neck.   ?-LDL 67.  Hemoglobin A1c 5.7.  ?-MR brain with acute left frontal and parietal infarcts.  TTE with LVEF greater than 75%, LV with no regional wall motion normalities, mild LVH, grade 1 diastolic dysfunction, trivial MR, no aortic stenosis, IVC normal in size, no evidence of intra-atrial shunt. ?-Neurology was following, appreciate assistance ?-C/w Aspirin 81 mg p.o. daily and Xarelto '15mg'$  PO daily ?--Monitor on telemetry ?--Seen by SLP ?--PT/OT recommend SNF placement ?--Continue therapy efforts while inpatient ?--SNF placement per TOC ? ?Hip fracture ([place]) ?-Patient with unwitnessed fall at home with x-ray notable for acute displaced right femoral neck fracture.   ?-Underwent right hemiarthroplasty on 08/20/2021 by Dr. Marcelino Scot.   ?-Vitamin D 25-hydroxy 12.78. ?-Orthopedics following, appreciate assistance ?-Home Xarelto for DVT PPX resumed ?-C/w Oxycodone 5 mg p.o. q6h PRN moderate pain; Further pain control per Ortho ?-Morphine 1 mg IV every 4 hours as needed severe pain ?-C/w Robaxin '500mg'$  PO q6h PRN muscle spasm ?-Started on vitamin D3 2000 units twice daily ?-WBAT with posterior hip precautions ?-PT/OT recommend SNF placement; per TOC ? ? ?Essential hypertension ?-Home medications include metoprolol tartrate 37.5 mg p.o. twice daily ?-Resumed home metoprolol 37.'5mg'$   BID ?-Continue to Monitor BP per Protocol  ?-Last BP reading was 128/57 ? ?Atrial fibrillation, chronic ([place]) ?-C/w Amiodarone 200 mg p.o. daily ?-C/w metoprolol tartrate 37.5 mg p.o. twice daily  ?-C/w Xarelto '15mg'$  PO daily ?-Monitor on telemetry while hospitalized  ? ?Acute renal failure superimposed on stage 3b chronic kidney disease ([place]) ?-Creatinine 1.2 on admission with baseline 0.8-1.0.   ?-Was given a 500 mL IV fluid bolus in ED. ?--Cr 1.2>0.95>1.04>0.82>0.67>0.92; Now BUN/Cr is stable at 17/0.76 ?-Avoid further nephrotoxic medication, consciousness, hypotension and adjust medications ?-Repeat CMP within 1 week  ? ?Leukocytosis ?-Patient is afebrile, but history of recurrent UTIs.  -Trending down ?-Unsure if this is related to recurrent UTI or stress reaction from fracture ?--WBC 19.6>23.5>12.1>10.4>9.1>11.4>10.0 -> 9.2 ?-Urine culture with insignificant growth ?-Completed 5-day course of IV ceftriaxone ?-Repeat CBC within 1 week  ? ?Sick sinus syndrome ([place]) ?-Status post PPM ?-Continue to Monitor on telemetry while hospitalized ? ?Acute postoperative anemia due to expected blood loss ?-Hemoglobin 14.3 on admission.   ?-Hemoglobin slowly drifted down to 7.6 yesterday morning.   ?-Etiology likely secondary to postoperative anemia coupled with blood loss from long bone fracture.   ?-Also complicated by anticoagulants with Xarelto for underlying A-fib with surgical dressing saturated in blood today. ?-Checked Anemia Panel and showed an iron level of 22, U IBC 163, TIBC of 183, saturation ratios of 12%, ferritin level 234, folate of 7.3, vitamin B12 0204 ?-  Transfuserf 1 unit PRBC yesterady ?-Orthopedic surgery felt this was expected drop ?-After transfusion of the 1 unit of PRBCs her hemoglobin/hematocrit went to 9.4/28.7 is now stable at 9.1/20.0 ?-Repeat CBC within 1 week ? ?Vitamin D deficiency ?-Vitamin D 25-hydroxy 12.78, low.   ?-Started on Cholecalciferol Vitamin D3 2000 units twice  daily. ? ?Hypokalemia ?-Potassium 3.8 this morning. ?-Continue to Monitor and Replete as Necessary ?-Repeat CMP within 1 week  ? ? ?Active Pressure Injury/Wound(s)   ? ? Pressure Ulcer  Duration  ? ?      ? Pressure Injury 04/04/

## 2021-08-24 NOTE — TOC Transition Note (Signed)
Transition of Care (TOC) - CM/SW Discharge Note ? ? ?Patient Details  ?Name: [name] Small ?MRN: [id number] ?Date of Birth: Apr 24, 1929 ? ?Transition of Care [facility]) CM/SW Contact:  ?Geralynn Ochs, LCSW ?Phone Number: ?08/24/2021, 12:32 PM ? ? ?Clinical Narrative:   CSW notified by MD that patient is stable for transfer to [facility] today. CSW updated [facility] and sent discharge information. CSW spoke with son, Timmothy Sours, who is in agreement and headed to SNF for paperwork. Transport scheduled with [facility] for next available. ? ?Nurse to call report to [phone], Room 155. ? ? ? ?Final next level of care: [place] ?Barriers to Discharge: Barriers Resolved ? ? ?Patient Goals and CMS Choice ?Patient states their goals for this hospitalization and ongoing recovery are:: patient unable to participate in goal setting, not fully oriented ?CMS Medicare.gov Compare Post Acute Care list provided to:: Patient Represenative (must comment) ?Choice offered to / list presented to : Adult Children ? ?Discharge Placement ?  ?           ?Patient chooses bed at: [facility] ?Patient to be transferred to facility by: [facility] ?Name of family member notified: Timmothy Sours ?Patient and family notified of of transfer: 08/24/21 ? ?Discharge Plan and Services ?  ?  ?Post Acute Care Choice: [place]          ?  ?  ?  ?  ?  ?  ?  ?  ?  ?  ? ?Social Determinants of Health (SDOH) Interventions ?  ? ? ?Readmission Risk Interventions ? ?  08/11/2021  ? 10:49 AM  ?Readmission Risk Prevention Plan  ?Transportation Screening Complete  ?[place] or Home Care Consult Complete  ?Social Work Consult for [place] Planning/Counseling Complete  ?Palliative Care Screening Not Applicable  ?Medication Review Press photographer) Complete  ? ? ? ? ? ?

## 2021-08-25 ENCOUNTER — Encounter: Payer: Self-pay | Admitting: Adult Health

## 2021-08-25 ENCOUNTER — Other Ambulatory Visit: Payer: Self-pay | Admitting: Adult Health

## 2021-08-25 ENCOUNTER — Non-Acute Institutional Stay (SKILLED_NURSING_FACILITY): Payer: Medicare Other | Admitting: Adult Health

## 2021-08-25 DIAGNOSIS — N179 Acute kidney failure, unspecified: Secondary | ICD-10-CM | POA: Diagnosis not present

## 2021-08-25 DIAGNOSIS — E538 Deficiency of other specified B group vitamins: Secondary | ICD-10-CM

## 2021-08-25 DIAGNOSIS — I10 Essential (primary) hypertension: Secondary | ICD-10-CM | POA: Diagnosis not present

## 2021-08-25 DIAGNOSIS — S72001A Fracture of unspecified part of neck of right femur, initial encounter for closed fracture: Secondary | ICD-10-CM | POA: Diagnosis not present

## 2021-08-25 DIAGNOSIS — I495 Sick sinus syndrome: Secondary | ICD-10-CM

## 2021-08-25 DIAGNOSIS — I482 Chronic atrial fibrillation, unspecified: Secondary | ICD-10-CM | POA: Diagnosis not present

## 2021-08-25 DIAGNOSIS — K219 Gastro-esophageal reflux disease without esophagitis: Secondary | ICD-10-CM | POA: Diagnosis not present

## 2021-08-25 DIAGNOSIS — D62 Acute posthemorrhagic anemia: Secondary | ICD-10-CM

## 2021-08-25 DIAGNOSIS — E559 Vitamin D deficiency, unspecified: Secondary | ICD-10-CM | POA: Diagnosis not present

## 2021-08-25 DIAGNOSIS — I639 Cerebral infarction, unspecified: Secondary | ICD-10-CM

## 2021-08-25 DIAGNOSIS — E782 Mixed hyperlipidemia: Secondary | ICD-10-CM

## 2021-08-25 DIAGNOSIS — N1832 Chronic kidney disease, stage 3b: Secondary | ICD-10-CM | POA: Diagnosis not present

## 2021-08-25 LAB — AMIODARONE LEVEL
Amiodarone Lvl: 1049 ng/mL (ref 1000–2500)
N-Desethyl-Amiodarone: 1316 ng/mL

## 2021-08-25 MED ORDER — OXYCODONE HCL 5 MG PO TABS: 5.0000 mg | ORAL_TABLET | Freq: Four times a day (QID) | ORAL | 0 refills | Status: DC | PRN

## 2021-08-25 NOTE — Progress Notes (Signed)
?Location:  [facility] ?Nursing Home Room Number: 155-P ?Place of Service:  SNF (31) ? ? ?CODE STATUS: DNR ? ?Allergies  ?Allergen Reactions  ? Septra Ds [Sulfamethoxazole-Trimethoprim]   ? Lisinopril Other (See Comments)  ?  Cough  ? ? ?Chief Complaint  ?Patient presents with  ? Hospitalization Follow-up  ? ? ?HPI: ? ?She is a 88 year old woman who has been hospitalized from 08-17-21 through 08-24-21. Her medical history includes: SSS s/p pace maker; vertebral compression fracture; CKD stage 3. She presented to the ED for slurred speech; facial droop and left sided weakness. She was found on the floor with right hip pain. She had recently been hospitalized for a UTI, which are recurrent.  ?For  her acute CVA: tPA was not given as she is chronically anticoagulated. She has an old left parietal cortical infarction with no acute finding. Hgb a1c 5.7   she is here for short term rehab with her goal to return back home. She does have right hip pain. She will continue to be followed for her chronic illnesses including: Acute CVA (cerebral vascular accident)  Atrial fibrillation / sick sinus syndrome: . Primary hypertension: Gastroesophageal reflux disease without esophagitis:  Closed displaced fracture of right femoral neck: ? ? ? ?Past Medical History:  ?Diagnosis Date  ? Atrial fibrillation ([place])   ? Bleeding stomach ulcer 1980s?  ? Cancer ([place]) Meloanoma left leg  ? DIVERTICULOSIS, COLON 11/22/2006  ? Headache(784.0)   ? "very often; not regular" (11/25/2015)  ? History of blood transfusion 1980s  ? "when I had the bleeding ulcer"  ? HYPERLIPIDEMIA 11/22/2006  ? Hypertension   ? Menopausal syndrome (hot flashes)   ? OSTEOPOROSIS 11/22/2006  ? Presence of permanent cardiac pacemaker   ? Vitamin D deficiency 08/21/2021  ? ? ?Past Surgical History:  ?Procedure Laterality Date  ? APPENDECTOMY    ? BUNIONECTOMY Right   ? CATARACT EXTRACTION W/ INTRAOCULAR LENS  IMPLANT, BILATERAL Bilateral 2000s  ? CHOLECYSTECTOMY OPEN    ?  EP IMPLANTABLE DEVICE N/A 11/25/2015  ? Procedure: Pacemaker Implant;  Surgeon: Will Meredith Leeds, MD;  Location: [facility];  Service: Cardiovascular;  Laterality: N/A;  ? FEMUR IM NAIL Left 07/08/2014  ? Procedure: INTRAMEDULLARY (IM) NAIL ;  Surgeon: Meredith Pel, MD;  Location: [facility];  Service: Orthopedics;  Laterality: Left;  ? FRACTURE SURGERY    ? HIP ARTHROPLASTY Right 08/20/2021  ? Procedure: ARTHROPLASTY BIPOLAR HIP (HEMIARTHROPLASTY);  Surgeon: Altamese Fredi [place], MD;  Location: [facility];  Service: Orthopedics;  Laterality: Right;  ? IR KYPHO LUMBAR INC FX REDUCE BONE BX UNI/BIL CANNULATION INC/IMAGING  03/21/2021  ? REVISION TOTAL HIP ARTHROPLASTY Left 06/2014  ? ? ?Social History  ? ?Socioeconomic History  ? Marital status: Widowed  ?  Spouse name: Not on file  ? Number of children: 2  ? Years of education: 89  ? Highest education level: Not on file  ?Occupational History  ?  Comment: retired  ?Tobacco Use  ? Smoking status: Never  ?  Passive exposure: Never  ? Smokeless tobacco: Never  ?Vaping Use  ? Vaping Use: Never used  ?Substance and Sexual Activity  ? Alcohol use: No  ?  Alcohol/week: 0.0 standard drinks  ? Drug use: No  ? Sexual activity: Never  ?Other Topics Concern  ? Not on file  ?Social History Narrative  ? Lives alone, son lives beside her  ? Caffeine - none  ? ?Social Determinants of Health  ? ?  Financial Resource Strain: Not on file  ?Food Insecurity: Not on file  ?Transportation Needs: Not on file  ?Physical Activity: Not on file  ?Stress: Not on file  ?Social Connections: Not on file  ?Intimate Partner Violence: Not on file  ? ?Family History  ?Problem Relation Age of Onset  ? Alzheimer's disease Sister   ? Cardiomyopathy Brother   ? ? ? ? ?VITAL SIGNS ?BP 140/75   Pulse 79   Temp 98.2 ?F (36.8 ?C)   Ht '5\' 5"'$  (1.651 m)   Wt 113 lb 3.2 oz (51.3 kg)   BMI 18.84 kg/m?  ? ?Outpatient Encounter Medications as of 08/25/2021  ?Medication Sig  ? acetaminophen (TYLENOL) 500 MG tablet Take  1 tablet (500 mg total) by mouth every 6 (six) hours as needed.  ? amiodarone (PACERONE) 200 MG tablet TAKE 1 TABLET(200 MG) BY MOUTH DAILY  ? ascorbic acid (VITAMIN C) 500 MG tablet Take 1 tablet (500 mg total) by mouth daily.  ? aspirin 81 MG chewable tablet Chew 1 tablet (81 mg total) by mouth daily.  ? atorvastatin (LIPITOR) 40 MG tablet TAKE 1 TABLET(40 MG) BY MOUTH DAILY  ? Balsam [place]-Cast[employer] Oil [facility]) OINT Special Instructions: apply to stage 1 pressure injury on medial buttocks ?Every Shift; Day  ? calcium citrate (CALCITRATE - DOSED IN MG ELEMENTAL CALCIUM) 950 (200 Ca) MG tablet Take 1 tablet (200 mg of elemental calcium total) by mouth 2 (two) times daily.  ? cholecalciferol (VITAMIN D) 25 MCG tablet Take 2 tablets (2,000 Units total) by mouth 2 (two) times daily.  ? methocarbamol (ROBAXIN) 500 MG tablet Take 1 tablet (500 mg total) by mouth every 6 (six) hours as needed for muscle spasms.  ? metoprolol tartrate (LOPRESSOR) 25 MG tablet Take 1.5 tablets (37.5 mg total) by mouth 2 (two) times daily.  ? nitrofurantoin, macrocrystal-monohydrate, (MACROBID) 100 MG capsule Take 1 capsule (100 mg total) by mouth at bedtime. (Patient not taking: Reported on 08/29/2021)  ? NON FORMULARY Diet - Dysphagia 3  ? ondansetron (ZOFRAN) 4 MG tablet Take 1 tablet (4 mg total) by mouth daily as needed for nausea or vomiting.  ? XARELTO 15 MG TABS tablet TAKE 1 TABLET(15 MG) BY MOUTH DAILY WITH DINNER  ?  oxyCODONE (ROXICODONE) 5 MG immediate release tablet Take 1 tablet (5 mg total) by mouth every 6 (six) hours as needed for up to 10 doses for severe pain.  ? pantoprazole (PROTONIX) 40 MG tablet Take 1 tablet (40 mg total) by mouth daily.  ? ?No facility-administered encounter medications on file as of 08/25/2021.  ? ? ? ?SIGNIFICANT DIAGNOSTIC EXAMS ? ?TODAY ? ?08-17-21: ct head stroke:  ?1. No acute CT finding. Atrophy and chronic small-vessel ischemic changes. 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 ?2. ASPECTS is 10,  allowing for the old infarction ? ?08-17-21: ct angio of head:  ?1. Decreased perfusion (Tmax> 6 seconds) in the posterior left MCA territory, which correlates with an area with decreased contrast enhancement in the MCA branches. While there is mild to moderate ?narrowing in the distal left M1 segment, no additional focal stenosis or occlusion is seen in the left MCA branches. No infarct core. ?2. Multifocal narrowing in the bilateral PCAs. No other hemodynamically significant intracranial stenosis. ?3. Approximately 50% proximal left ICA stenosis, unchanged. No additional hemodynamically significant stenosis in the neck. ? ?08-17-21: right hip x-ray: Acute displaced right femoral neck fracture.  ? ?08-18-21: MRI of brain ?1. Moderately motion limited study with evidence of  acute left frontal and parietal infarcts. The left parietal infarct is along the posterior aspect of a remote left parietal infarct. ?2. Moderate chronic microvascular ischemic disease. ?3.  Cerebral atrophy  ? ?LABS REVIEWED;  ? ?08-17-21: wbc 19.6 hgb 14.3; hct 45.2 mcv 94.8 plt 209; glucose 144; bun 15; creat 1.22 k+ 3.8; na++ 137; ca 9.1; GFR 42; protein 6.7; albumin 3.8 hgb a1c 5.7 ?08-18-21: urine culture <10,000. Vitamin B 12: 232; tsh 1.864 ?08-21-21: vit D 12.78 ?08-23-21: vitamin B 12: 204; iron 22; tibc 185; ferritin 234;  ?08-24-21: wbc 9.2; hgb 9.1; hct 27.0; mcv 90.0 plt 154; glucose 104; bun 17; creat 0.76; k + 3.8; na++ 136; ca 8.1; GFR >60 ?  ?Review of Systems  ?Constitutional:  Negative for malaise/fatigue.  ?Respiratory:  Negative for cough and shortness of breath.   ?Cardiovascular:  Negative for chest pain, palpitations and leg swelling.  ?Gastrointestinal:  Negative for abdominal pain, constipation and heartburn.  ?Musculoskeletal:  Positive for joint pain. Negative for back pain and myalgias.  ?     Right hip pain   ?Skin: Negative.   ?Neurological:  Negative for dizziness.  ?Psychiatric/Behavioral:  The patient is not  nervous/anxious.   ? ?Physical Exam ?Constitutional:   ?   General: She is not in acute distress. ?   Appearance: She is well-developed. She is not diaphoretic.  ?Neck:  ?   Thyroid: No thyromegaly.  ?Cardiovascular:  ?

## 2021-08-29 ENCOUNTER — Non-Acute Institutional Stay (SKILLED_NURSING_FACILITY): Payer: Medicare Other | Admitting: Internal Medicine

## 2021-08-29 ENCOUNTER — Encounter: Payer: Self-pay | Admitting: Internal Medicine

## 2021-08-29 DIAGNOSIS — I639 Cerebral infarction, unspecified: Secondary | ICD-10-CM | POA: Diagnosis not present

## 2021-08-29 DIAGNOSIS — D62 Acute posthemorrhagic anemia: Secondary | ICD-10-CM | POA: Diagnosis not present

## 2021-08-29 DIAGNOSIS — R29818 Other symptoms and signs involving the nervous system: Secondary | ICD-10-CM

## 2021-08-29 DIAGNOSIS — R4189 Other symptoms and signs involving cognitive functions and awareness: Secondary | ICD-10-CM

## 2021-08-29 DIAGNOSIS — E559 Vitamin D deficiency, unspecified: Secondary | ICD-10-CM

## 2021-08-29 DIAGNOSIS — S72142D Displaced intertrochanteric fracture of left femur, subsequent encounter for closed fracture with routine healing: Secondary | ICD-10-CM | POA: Diagnosis not present

## 2021-08-29 DIAGNOSIS — L89301 Pressure ulcer of unspecified buttock, stage 1: Secondary | ICD-10-CM

## 2021-08-29 NOTE — Progress Notes (Signed)
? ?NURSING HOME LOCATION: [place] ?ROOM NUMBER:  155 P ? ?CODE STATUS:  DNR ? ?PCP:  Grier Mitts MD ? ?This is a comprehensive admission note to this SNFperformed on this date less than 30 days from date of admission. ?Included are preadmission medical/surgical history; reconciled medication list; family history; social history and comprehensive review of systems.  ?Corrections and additions to the records were documented. Comprehensive physical exam was also performed. Additionally a clinical summary was entered for each active diagnosis pertinent to this admission in the Problem List to enhance continuity of care. ? ?HPI: She was hospitalized 3/29 - 08/24/2021 with acute cerebrovascular accident and hip fracture. ?She presented via EMS with a code stroke for evaluation of slurred speech, facial droop, and left-sided weakness.  Patient had been observed approximately 30 minutes prior to being found down without any deficits.  Patient could provide no history as to the nature of any fall but described pain in the right hip. ?The patient had recently been hospitalized 3/22 - 3/24 for acute metabolic encephalopathy secondary to UTI for which she received vancomycin and Rocephin.  She had been transitioned to oral nitrofurantoin. ?In the ED she was afebrile and vital signs were stable except for blood pressure 153/76.  Creatinine was 1.22 with a GFR greater than 60.  Significant leukocytosis was present with a white count of 19,600.  H/H14.3/45.2.  TSH was therapeutic.   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  CT of the head/neck revealed decreased perfusion in the posterior left MCA, moderate narrowing of the distal left M1 segment, multifocal narrowing bilateral PCAs, and 50% proximal left ICA stenosis.  MRI revealed acute left frontal and parietal infarcts.Neurology consulted; she was not a candidate for tPA due to  chronic anticoagulation on Xarelto.  Prophylaxis was continued with 81 mg of aspirin and Xarelto 15 mg daily. ?Imaging revealed displaced right femoral neck fracture. Right hip hemiarthroplasty was performed.  4/1 by Dr. Marcelino Scot . Course was complicated by acute blood loss anemia with nadir hemoglobin of 7.6. After packed RBC transfusion H/H  9.1/27.0.  Xarelto was subsequently restarted.Vitamin D 25-hydroxy level was 12.78.  Cholecalciferol vitamin D3 2000 units twice daily was initiated. ?White count did peak at 23,500 but subsequently resolved with a final value of 9.2.  Urine culture revealed insignificant growth.  She had completed a 5-day course of ceftriaxone. ?Glucoses ranged from a low of 104 up to a high of 148.  A1c was prediabetic with a value of 5.7%. ?Stage I pressure injury of the buttocks was documented 4/4.  This was described as blanchable redness at the localized area.  Wound care followed this as impatient. ?PT/OT recommended placement for rehab.   ? ?Past medical and surgical history: Includes atrial fibrillation, history of gastric ulcer, history of melanoma LLE, history of diverticulosis, dyslipidemia, essential hypertension, osteoporosis, and history of heart block. ?Duration procedures include open cholecystectomy, pacemaker insertion, IM nailing of the left femur in 2016 with subsequent revision of the left hip arthroplasty, and IR lumbar spine procedure. ? ?Social history: Nondrinker; non-smoker. ? ?Family history: Noncontributory due to advanced age. ?  ?Review of systems: Clinical neurocognitive deficits made validity of responses questionable ;preventing reliable ROS completion. Date given as "September 23rd, 2006".  She did identify the POTUS. ?She stated that she was "okay right now".  When asked why she had been in the hospital she said "broke my hip".  She validated that she had fallen in the house but did not know why.  She validated having a stroke only after I asked about  such. ? ?Constitutional: No fever, significant weight change, fatigue  ?Eyes: No redness, discharge, pain, vision change ?ENT/mouth: No nasal congestion, purulent discharge, earache, change in hearing, sore throat  ?Cardiovascular: No chest pain, palpitations, paroxysmal nocturnal dyspnea, claudication, edema  ?Respiratory: No cough, sputum production, hemoptysis, DOE, significant snoring, apnea Gastrointestinal: No heartburn, dysphagia, abdominal pain, nausea /vomiting, rectal bleeding, melena, change in bowels ?Genitourinary: No dysuria, hematuria, pyuria, incontinence, nocturia ?Dermatologic: No rash, pruritus, change in appearance of skin ?Neurologic: No dizziness, headache, syncope, seizures, numbness, tingling ?Psychiatric: No significant anxiety, depression, insomnia, anorexia ?Endocrine: No change in hair/skin/nails, excessive thirst, excessive hunger, excessive urination  ?Hematologic/lymphatic: No significant bruising, lymphadenopathy, abnormal bleeding ? ?Physical exam:  ?Pertinent or positive findings: Wasting of the temples is present.  She has bilateral ptosis.  Heart rhythm is slow and regular. Pacemaker present L upper chest. Dorsalis pedis pulses are stronger than posterior tibial pulses.  The lower extremities are in soft braces.  She has interosseous wasting of the hands.  There is bruising over the upper sternum and over the forearms. Buttock Stage 1 wound not visualized. ? ?General appearance: no acute distress, increased work of breathing is present.   ?Lymphatic: No lymphadenopathy about the head, neck, axilla. ?Eyes: No conjunctival inflammation or lid edema is present. There is no scleral icterus. ?Ears:  External ear exam shows no significant lesions or deformities.   ?Nose:  External nasal examination shows no deformity or inflammation. Nasal mucosa are pink and moist without lesions, exudates ?Oral exam: Lips and gums are healthy appearing.There is no oropharyngeal erythema or  exudate. ?Neck:  No thyromegaly, masses, tenderness noted.    ?Heart:  No gallop, murmur, click, rub.  ?Lungs: without wheezes, rhonchi, rales, rubs. ?Abdomen: Bowel sounds are normal.  Abdomen is soft and nontender with no organomegaly, hernias, masses. ?GU: Deferred  ?Extremities:  No cyanosis, clubbing, edema. ?Neurologic exam:  Strength equal  in upper & lower extremities. ?Balance, Rhomberg, finger to nose testing could not be completed due to clinical state ?Deep tendon reflexes are equal ?Skin: Warm & dry w/o tenting. ?No significant rash. ? ?See clinical summary under each active problem in the Problem List with associated updated therapeutic plan ? ?

## 2021-08-29 NOTE — Assessment & Plan Note (Signed)
Today gave date as "September, 23, 2006". She identified the POTUS correctly. ?Initially she did not acknowledge having had CVA until I asked her her directly. ?

## 2021-08-30 NOTE — Assessment & Plan Note (Addendum)
No bleeding dyscrasias reported @ SNF. ?

## 2021-08-30 NOTE — Patient Instructions (Signed)
See assessment and plan under each diagnosis in the problem list and acutely for this visit 

## 2021-08-30 NOTE — Assessment & Plan Note (Addendum)
Stage I buttocks lesion will be monitored by the SNF [facility]. ?

## 2021-08-30 NOTE — Assessment & Plan Note (Signed)
Patient unaware she had had a stroke.  Vascular dementia present.  No behavioral issues reported. ?

## 2021-08-30 NOTE — Progress Notes (Signed)
Remote pacemaker transmission.   

## 2021-08-30 NOTE — Assessment & Plan Note (Signed)
Continue high-dose vitamin D supplementation.  PT/OT at SNF as tolerated.  Monitor for any bleeding dyscrasias on Xarelto and low-dose aspirin. ?

## 2021-08-30 NOTE — Assessment & Plan Note (Signed)
Vitamin D 25-hydroxy level 12.78.  Cholecalciferol vitamin D3 2000 units twice daily initiated and will be continued at the SNF. ?

## 2021-09-01 ENCOUNTER — Encounter: Payer: Self-pay | Admitting: Adult Health

## 2021-09-01 ENCOUNTER — Encounter (HOSPITAL_COMMUNITY)
Admission: RE | Admit: 2021-09-01 | Discharge: 2021-09-01 | Disposition: A | Discharge status: Home / Self Care | Payer: Medicare Other | Source: Skilled Nursing Facility | Attending: Internal Medicine | Admitting: Internal Medicine

## 2021-09-01 DIAGNOSIS — M810 Age-related osteoporosis without current pathological fracture: Secondary | ICD-10-CM | POA: Insufficient documentation

## 2021-09-01 DIAGNOSIS — E538 Deficiency of other specified B group vitamins: Secondary | ICD-10-CM | POA: Insufficient documentation

## 2021-09-01 DIAGNOSIS — S72001A Fracture of unspecified part of neck of right femur, initial encounter for closed fracture: Secondary | ICD-10-CM | POA: Insufficient documentation

## 2021-09-01 DIAGNOSIS — S72001D Fracture of unspecified part of neck of right femur, subsequent encounter for closed fracture with routine healing: Secondary | ICD-10-CM | POA: Insufficient documentation

## 2021-09-01 DIAGNOSIS — I10 Essential (primary) hypertension: Secondary | ICD-10-CM | POA: Insufficient documentation

## 2021-09-01 LAB — COMPREHENSIVE METABOLIC PANEL
ALT: 20 U/L (ref 0–44)
AST: 23 U/L (ref 15–41)
Albumin: 2.7 g/dL — ABNORMAL LOW (ref 3.5–5.0)
Alkaline Phosphatase: 147 U/L — ABNORMAL HIGH (ref 38–126)
Anion gap: 7 (ref 5–15)
BUN: 14 mg/dL (ref 8–23)
CO2: 29 mmol/L (ref 22–32)
Calcium: 8.3 mg/dL — ABNORMAL LOW (ref 8.9–10.3)
Chloride: 100 mmol/L (ref 98–111)
Creatinine, Ser: 0.78 mg/dL (ref 0.44–1.00)
GFR, Estimated: >60 mL/min (ref >60)
Glucose, Bld: 87 mg/dL (ref 70–99)
Potassium: 4.1 mmol/L (ref 3.5–5.1)
Sodium: 136 mmol/L (ref 135–145)
Total Bilirubin: 1.2 mg/dL (ref 0.3–1.2)
Total Protein: 5.7 g/dL — ABNORMAL LOW (ref 6.5–8.1)

## 2021-09-01 LAB — CBC
HCT: 28.9 % — ABNORMAL LOW (ref 36.0–46.0)
Hemoglobin: 9 g/dL — ABNORMAL LOW (ref 12.0–15.0)
MCH: 29.4 pg (ref 26.0–34.0)
MCHC: 31.1 g/dL (ref 30.0–36.0)
MCV: 94.4 fL (ref 80.0–100.0)
Platelets: 340 10*3/uL (ref 150–400)
RBC: 3.06 MIL/uL — ABNORMAL LOW (ref 3.87–5.11)
RDW: 15.9 % — ABNORMAL HIGH (ref 11.5–15.5)
WBC: 9.6 10*3/uL (ref 4.0–10.5)
nRBC: 0 % (ref 0.0–0.2)

## 2021-09-01 LAB — MAGNESIUM: Magnesium: 2.3 mg/dL (ref 1.7–2.4)

## 2021-09-01 LAB — PHOSPHORUS: Phosphorus: 3.3 mg/dL (ref 2.5–4.6)

## 2021-09-06 ENCOUNTER — Ambulatory Visit: Payer: Medicare Other | Admitting: Urology

## 2021-09-06 NOTE — Telephone Encounter (Signed)
Spoke with [name] Small, stated pt is doing a little better, but not a lot. Will be going back to se the surgeon tomorrow, is not ale to walk, states not sure if she will be able to walk. Also stated he is having a meeting on Friday to see how things are going.  ?

## 2021-09-07 DIAGNOSIS — S72031D Displaced midcervical fracture of right femur, subsequent encounter for closed fracture with routine healing: Secondary | ICD-10-CM | POA: Diagnosis not present

## 2021-09-09 ENCOUNTER — Encounter: Payer: Self-pay | Admitting: Adult Health

## 2021-09-09 ENCOUNTER — Non-Acute Institutional Stay (SKILLED_NURSING_FACILITY): Payer: Medicare Other | Admitting: Adult Health

## 2021-09-09 DIAGNOSIS — I495 Sick sinus syndrome: Secondary | ICD-10-CM | POA: Diagnosis not present

## 2021-09-09 DIAGNOSIS — S72001A Fracture of unspecified part of neck of right femur, initial encounter for closed fracture: Secondary | ICD-10-CM

## 2021-09-09 DIAGNOSIS — I482 Chronic atrial fibrillation, unspecified: Secondary | ICD-10-CM

## 2021-09-09 NOTE — Progress Notes (Signed)
? ?Location:  [place] ?Nursing Home Room Number: 155-P ?Place of Service:  SNF (31) ? ? ?CODE STATUS: dnr  ? ?Allergies  ?Allergen Reactions  ? Septra Ds [Sulfamethoxazole-Trimethoprim]   ? Lisinopril Other (See Comments)  ?  Cough  ? ? ?Chief Complaint  ?Patient presents with  ? Acute Visit  ?  Care plan meeting  ? ? ?HPI: ? ?We have come together for her care plan meeting. Family present  BIMS 10/15 mood 9/30: not eating well; decreased energy; anxious; some depression. She extensive to dependent for her adl care. She is continent of bladder and frequently incontinent of bowel. She is nonambulatory no falls. Dietary: soft diet chopped meat; weight is 113 pounds and stable appetite is fair which is improving. Is on magic cup daily.  Therapy: bed mobility: min assistance; stand/transfer: contact guard; ambulate 20 feet mod assist; upper body min assist; lower body mod assist; toilet; max assist. Goal is uncertain at this time to go home; although going home may not be an option.  She continues to be followed for her chronic illnesses including:   Atrial fibrillation chronic  Sick sinus syndrome   Closed displaced fracture of right femoral neck  ? ?Past Medical History:  ?Diagnosis Date  ? Atrial fibrillation ([place])   ? Bleeding stomach ulcer 1980s?  ? Cancer ([place]) Meloanoma left leg  ? DIVERTICULOSIS, COLON 11/22/2006  ? Headache(784.0)   ? "very often; not regular" (11/25/2015)  ? History of blood transfusion 1980s  ? "when I had the bleeding ulcer"  ? HYPERLIPIDEMIA 11/22/2006  ? Hypertension   ? Menopausal syndrome (hot flashes)   ? OSTEOPOROSIS 11/22/2006  ? Presence of permanent cardiac pacemaker   ? Vitamin D deficiency 08/21/2021  ? ? ?Past Surgical History:  ?Procedure Laterality Date  ? APPENDECTOMY    ? BUNIONECTOMY Right   ? CATARACT EXTRACTION W/ INTRAOCULAR LENS  IMPLANT, BILATERAL Bilateral 2000s  ? CHOLECYSTECTOMY OPEN    ? EP IMPLANTABLE DEVICE N/A 11/25/2015  ? Procedure: Pacemaker Implant;   Surgeon: Will Meredith Leeds, MD;  Location: [facility];  Service: Cardiovascular;  Laterality: N/A;  ? FEMUR IM NAIL Left 07/08/2014  ? Procedure: INTRAMEDULLARY (IM) NAIL ;  Surgeon: Meredith Pel, MD;  Location: [facility];  Service: Orthopedics;  Laterality: Left;  ? FRACTURE SURGERY    ? HIP ARTHROPLASTY Right 08/20/2021  ? Procedure: ARTHROPLASTY BIPOLAR HIP (HEMIARTHROPLASTY);  Surgeon: Altamese Freistatt, MD;  Location: [facility];  Service: Orthopedics;  Laterality: Right;  ? IR KYPHO LUMBAR INC FX REDUCE BONE BX UNI/BIL CANNULATION INC/IMAGING  03/21/2021  ? REVISION TOTAL HIP ARTHROPLASTY Left 06/2014  ? ? ?Social History  ? ?Socioeconomic History  ? Marital status: Widowed  ?  Spouse name: Not on file  ? Number of children: 2  ? Years of education: 17  ? Highest education level: Not on file  ?Occupational History  ?  Comment: retired  ?Tobacco Use  ? Smoking status: Never  ?  Passive exposure: Never  ? Smokeless tobacco: Never  ?Vaping Use  ? Vaping Use: Never used  ?Substance and Sexual Activity  ? Alcohol use: No  ?  Alcohol/week: 0.0 standard drinks  ? Drug use: No  ? Sexual activity: Never  ?Other Topics Concern  ? Not on file  ?Social History Narrative  ? Lives alone, son lives beside her  ? Caffeine - none  ? ?Social Determinants of Health  ? ?Financial Resource Strain: Not on file  ?  Food Insecurity: Not on file  ?Transportation Needs: Not on file  ?Physical Activity: Not on file  ?Stress: Not on file  ?Social Connections: Not on file  ?Intimate Partner Violence: Not on file  ? ?Family History  ?Problem Relation Age of Onset  ? Alzheimer's disease Sister   ? Cardiomyopathy Brother   ? ? ? ? ?VITAL SIGNS ?BP 127/64   Pulse 62   Temp 97.9 ?F (36.6 ?C)   Resp 20   Ht '5\' 5"'$  (1.651 m)   Wt 113 lb 6.4 oz (51.4 kg)   BMI 18.87 kg/m?  ? ?Outpatient Encounter Medications as of 09/09/2021  ?Medication Sig  ? acetaminophen (TYLENOL) 500 MG tablet Take 1 tablet (500 mg total) by mouth every 6 (six) hours as  needed.  ? amiodarone (PACERONE) 200 MG tablet TAKE 1 TABLET(200 MG) BY MOUTH DAILY  ? ascorbic acid (VITAMIN C) 500 MG tablet Take 1 tablet (500 mg total) by mouth daily.  ? aspirin 81 MG chewable tablet Chew 1 tablet (81 mg total) by mouth daily.  ? atorvastatin (LIPITOR) 40 MG tablet TAKE 1 TABLET(40 MG) BY MOUTH DAILY  ? Balsam [place]-Castor Oil [name]) OINT Special Instructions: apply to stage 1 pressure injury on medial buttocks ?Every Shift; Day  ? calcium citrate (CALCITRATE - DOSED IN MG ELEMENTAL CALCIUM) 950 (200 Ca) MG tablet Take 1 tablet (200 mg of elemental calcium total) by mouth 2 (two) times daily.  ? cholecalciferol (VITAMIN D) 25 MCG tablet Take 2 tablets (2,000 Units total) by mouth 2 (two) times daily.  ? methocarbamol (ROBAXIN) 500 MG tablet Take 1 tablet (500 mg total) by mouth every 6 (six) hours as needed for muscle spasms.  ? metoprolol tartrate (LOPRESSOR) 25 MG tablet Take 1.5 tablets (37.5 mg total) by mouth 2 (two) times daily.  ? nitrofurantoin, macrocrystal-monohydrate, (MACROBID) 100 MG capsule Take 1 capsule (100 mg total) by mouth at bedtime. (Patient not taking: Reported on 08/29/2021)  ? NON FORMULARY Diet - Dysphagia 3  ? ondansetron (ZOFRAN) 4 MG tablet Take 1 tablet (4 mg total) by mouth daily as needed for nausea or vomiting.  ? oxyCODONE (ROXICODONE) 5 MG immediate release tablet Take 1 tablet (5 mg total) by mouth every 6 (six) hours as needed for up to 10 doses for severe pain.  ? pantoprazole (PROTONIX) 40 MG tablet Take 1 tablet (40 mg total) by mouth daily.  ? XARELTO 15 MG TABS tablet TAKE 1 TABLET(15 MG) BY MOUTH DAILY WITH DINNER  ? ?No facility-administered encounter medications on file as of 09/09/2021.  ? ? ? ?SIGNIFICANT DIAGNOSTIC EXAMS ? ? ?PREVIOUS  ? ?08-17-21: ct head stroke:  ?1. No acute CT finding. Atrophy and chronic small-vessel ischemic changes. 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 ?2. ASPECTS is 10, allowing for the old infarction ? ?08-17-21: ct angio  of head:  ?1. Decreased perfusion (Tmax> 6 seconds) in the posterior left MCA territory, which correlates with an area with decreased contrast enhancement in the MCA branches. While there is mild to moderate ?narrowing in the distal left M1 segment, no additional focal stenosis or occlusion is seen in the left MCA branches. No infarct core. ?2. Multifocal narrowing in the bilateral PCAs. No other hemodynamically significant intracranial stenosis. ?3. Approximately 50% proximal left ICA stenosis, unchanged. No additional hemodynamically significant stenosis in the neck. ? ?08-17-21: right hip x-ray: Acute displaced right femoral neck fracture.  ? ?08-18-21: MRI of brain ?1. Moderately motion limited study with evidence of acute left  frontal and parietal infarcts. The left parietal infarct is along the posterior aspect of a remote left parietal infarct. ?2. Moderate chronic microvascular ischemic disease. ?3.  Cerebral atrophy  ? ?NO NEW EXAMS.  ? ?LABS REVIEWED; PREVIOUS  ? ?08-17-21: wbc 19.6 hgb 14.3; hct 45.2 mcv 94.8 plt 209; glucose 144; bun 15; creat 1.22 k+ 3.8; na++ 137; ca 9.1; GFR 42; protein 6.7; albumin 3.8 hgb a1c 5.7 ?08-18-21: urine culture <10,000. Vitamin B 12: 232; tsh 1.864 ?08-21-21: vit D 12.78 ?08-23-21: vitamin B 12: 204; iron 22; tibc 185; ferritin 234;  ?08-24-21: wbc 9.2; hgb 9.1; hct 27.0; mcv 90.0 plt 154; glucose 104; bun 17; creat 0.76; k + 3.8; na++ 136; ca 8.1; GFR >60 ? ?NO NEW LABS.  ? ?Review of Systems  ?Constitutional:  Negative for malaise/fatigue.  ?Respiratory:  Negative for cough and shortness of breath.   ?Cardiovascular:  Negative for chest pain, palpitations and leg swelling.  ?Gastrointestinal:  Negative for abdominal pain, constipation and heartburn.  ?Musculoskeletal:  Negative for back pain, joint pain and myalgias.  ?Skin: Negative.   ?Neurological:  Negative for dizziness.  ?Psychiatric/Behavioral:  The patient is not nervous/anxious.   ? ?Physical Exam ?Constitutional:   ?    General: She is not in acute distress. ?   Appearance: She is well-developed. She is not diaphoretic.  ?Neck:  ?   Thyroid: No thyromegaly.  ?Cardiovascular:  ?   Rate and Rhythm: Normal rate and regular rhythm.

## 2021-09-09 NOTE — Progress Notes (Signed)
?Location:  [place] ?Nursing Home Room Number: 155-P ?Place of Service:  SNF (31) ? ? ?CODE STATUS: DNR ? ?Allergies  ?Allergen Reactions  ? Septra Ds [Sulfamethoxazole-Trimethoprim]   ? Lisinopril Other (See Comments)  ?  Cough  ? ? ?Chief Complaint  ?Patient presents with  ? Acute Visit  ?  Care plan meeting  ? ? ?HPI: ? ? ? ?Past Medical History:  ?Diagnosis Date  ? Atrial fibrillation ([place])   ? Bleeding stomach ulcer 1980s?  ? Cancer ([place]) Meloanoma left leg  ? DIVERTICULOSIS, COLON 11/22/2006  ? Headache(784.0)   ? "very often; not regular" (11/25/2015)  ? History of blood transfusion 1980s  ? "when I had the bleeding ulcer"  ? HYPERLIPIDEMIA 11/22/2006  ? Hypertension   ? Menopausal syndrome (hot flashes)   ? OSTEOPOROSIS 11/22/2006  ? Presence of permanent cardiac pacemaker   ? Vitamin D deficiency 08/21/2021  ? ? ?Past Surgical History:  ?Procedure Laterality Date  ? APPENDECTOMY    ? BUNIONECTOMY Right   ? CATARACT EXTRACTION W/ INTRAOCULAR LENS  IMPLANT, BILATERAL Bilateral 2000s  ? CHOLECYSTECTOMY OPEN    ? EP IMPLANTABLE DEVICE N/A 11/25/2015  ? Procedure: Pacemaker Implant;  Surgeon: Will Meredith Leeds, MD;  Location: [facility];  Service: Cardiovascular;  Laterality: N/A;  ? FEMUR IM NAIL Left 07/08/2014  ? Procedure: INTRAMEDULLARY (IM) NAIL ;  Surgeon: Meredith Pel, MD;  [facility];  Service: Orthopedics;  Laterality: Left;  ? FRACTURE SURGERY    ? HIP ARTHROPLASTY Right 08/20/2021  ? Procedure: ARTHROPLASTY BIPOLAR HIP (HEMIARTHROPLASTY);  Surgeon: Altamese Kilbourne, MD;  Location: [facility];  Service: Orthopedics;  Laterality: Right;  ? IR KYPHO LUMBAR INC FX REDUCE BONE BX UNI/BIL CANNULATION INC/IMAGING  03/21/2021  ? REVISION TOTAL HIP ARTHROPLASTY Left 06/2014  ? ? ?Social History  ? ?Socioeconomic History  ? Marital status: Widowed  ?  Spouse name: Not on file  ? Number of children: 2  ? Years of education: 32  ? Highest education level: Not on file  ?Occupational History  ?   Comment: retired  ?Tobacco Use  ? Smoking status: Never  ?  Passive exposure: Never  ? Smokeless tobacco: Never  ?Vaping Use  ? Vaping Use: Never used  ?Substance and Sexual Activity  ? Alcohol use: No  ?  Alcohol/week: 0.0 standard drinks  ? Drug use: No  ? Sexual activity: Never  ?Other Topics Concern  ? Not on file  ?Social History Narrative  ? Lives alone, son lives beside her  ? Caffeine - none  ? ?Social Determinants of Health  ? ?Financial Resource Strain: Not on file  ?Food Insecurity: Not on file  ?Transportation Needs: Not on file  ?Physical Activity: Not on file  ?Stress: Not on file  ?Social Connections: Not on file  ?Intimate Partner Violence: Not on file  ? ?Family History  ?Problem Relation Age of Onset  ? Alzheimer's disease Sister   ? Cardiomyopathy Brother   ? ? ? ? ?VITAL SIGNS ?BP 127/64   Pulse 62   Temp 97.9 ?F (36.6 ?C)   Resp 20   Ht '5\' 5"'$  (1.651 m)   Wt 113 lb 6.4 oz (51.4 kg)   BMI 18.87 kg/m?  ? ?Outpatient Encounter Medications as of 09/09/2021  ?Medication Sig  ? acetaminophen (TYLENOL) 500 MG tablet Take 1 tablet (500 mg total) by mouth every 6 (six) hours as needed. (Patient taking differently: Take 650 mg by mouth  every 6 (six) hours as needed.)  ? amiodarone (PACERONE) 200 MG tablet TAKE 1 TABLET(200 MG) BY MOUTH DAILY  ? ascorbic acid (VITAMIN C) 500 MG tablet Take 1 tablet (500 mg total) by mouth daily.  ? aspirin 81 MG chewable tablet Chew 1 tablet (81 mg total) by mouth daily.  ? atorvastatin (LIPITOR) 40 MG tablet TAKE 1 TABLET(40 MG) BY MOUTH DAILY  ? Balsam [place]-Castor Oil [facility]) OINT Special Instructions: apply to DTI Right & Left medial buttock Every Shift; Day  ? calcium citrate (CALCITRATE - DOSED IN MG ELEMENTAL CALCIUM) 950 (200 Ca) MG tablet Take 1 tablet (200 mg of elemental calcium total) by mouth 2 (two) times daily.  ? cholecalciferol (VITAMIN D) 25 MCG tablet Take 2 tablets (2,000 Units total) by mouth 2 (two) times daily.  ? metoprolol tartrate (LOPRESSOR)  25 MG tablet Take 1.5 tablets (37.5 mg total) by mouth 2 (two) times daily.  ? nitrofurantoin, macrocrystal-monohydrate, (MACROBID) 100 MG capsule Take 1 capsule (100 mg total) by mouth at bedtime.  ? NON FORMULARY Diet - Dysphagia 3  ? ondansetron (ZOFRAN) 4 MG tablet Take 1 tablet (4 mg total) by mouth daily as needed for nausea or vomiting.  ? vitamin B-12 (CYANOCOBALAMIN) 1000 MCG tablet Take 1,000 mcg by mouth daily. for level 204  ? XARELTO 15 MG TABS tablet TAKE 1 TABLET(15 MG) BY MOUTH DAILY WITH DINNER  ? oxyCODONE (ROXICODONE) 5 MG immediate release tablet Take 1 tablet (5 mg total) by mouth every 6 (six) hours as needed for up to 10 doses for severe pain. (Patient not taking: Reported on 09/09/2021)  ? pantoprazole (PROTONIX) 40 MG tablet Take 1 tablet (40 mg total) by mouth daily.  ? [DISCONTINUED] methocarbamol (ROBAXIN) 500 MG tablet Take 1 tablet (500 mg total) by mouth every 6 (six) hours as needed for muscle spasms.  ? ?No facility-administered encounter medications on file as of 09/09/2021.  ? ? ? ?SIGNIFICANT DIAGNOSTIC EXAMS ? ? ? ? ? ? ?ASSESSMENT/ PLAN: ? ? ? ? ?Ok Edwards NP ?[facility]  ?Contact [phone] Monday through Friday 8am- 5pm  ?After hours call [phone]  ? ?

## 2021-09-21 ENCOUNTER — Encounter: Payer: Self-pay | Admitting: *Deleted

## 2021-09-22 ENCOUNTER — Ambulatory Visit: Payer: Medicare Other | Admitting: Diagnostic Neuroimaging

## 2021-09-22 ENCOUNTER — Encounter: Payer: Self-pay | Admitting: Diagnostic Neuroimaging

## 2021-09-22 VITALS — BP 151/81 | HR 64 | Ht 65.0 in | Wt 114.2 lb

## 2021-09-22 DIAGNOSIS — I480 Paroxysmal atrial fibrillation: Secondary | ICD-10-CM

## 2021-09-22 DIAGNOSIS — I63412 Cerebral infarction due to embolism of left middle cerebral artery: Secondary | ICD-10-CM

## 2021-09-22 DIAGNOSIS — S72001A Fracture of unspecified part of neck of right femur, initial encounter for closed fracture: Secondary | ICD-10-CM

## 2021-09-22 DIAGNOSIS — I10 Essential (primary) hypertension: Secondary | ICD-10-CM | POA: Diagnosis not present

## 2021-09-22 NOTE — Progress Notes (Signed)
? ?[facility] ? ?PATIENT: [name] Small ?DOB: 05/21/1929 ? ?REFERRING CLINICIAN: Hendricks Limes, MD ?HISTORY FROM: patient and sons ?REASON FOR VISIT: new consult ? ? ?HISTORICAL ? ?CHIEF COMPLAINT:  ?Chief Complaint  ?Patient presents with  ? Cerebrovascular Accident  ?  Rm 6 hospital FU sons- Haynes Dage resides at [facility]  ? ? ?HISTORY OF PRESENT ILLNESS:  ? ?88 year old female here for evaluation of stroke hospital follow-up. ? ?Patient presented to hospital on August 17, 2021 for acute slurred speech, right facial weakness, right-sided weakness.  Also had right hip pain.  She was diagnosed with left brain ischemic infarctions, right hip fracture and UTI.  Patient was treated medically and required right hip surgery.  She was discharged on Xarelto anticoagulation for atrial fibrillation, which she was also taking prior to admission.  On 09/09/2021 patient and family had a goals of care meeting and it was decided to discontinue anticoagulation and statin due to patient's age and transition to palliative approach. ? ? ? ?REVIEW OF SYSTEMS: Full 14 system review of systems performed and negative with exception of: as per HPI. ? ?ALLERGIES: ?Allergies  ?Allergen Reactions  ? Septra Ds [Sulfamethoxazole-Trimethoprim]   ? Lisinopril Other (See Comments)  ?  Cough  ? ? ?HOME MEDICATIONS: ?Outpatient Medications Prior to Visit  ?Medication Sig Dispense Refill  ? acetaminophen (TYLENOL) 500 MG tablet Take 1 tablet (500 mg total) by mouth every 6 (six) hours as needed. (Patient taking differently: Take 650 mg by mouth every 6 (six) hours as needed.) 30 tablet 0  ? amiodarone (PACERONE) 200 MG tablet TAKE 1 TABLET(200 MG) BY MOUTH DAILY 90 tablet 2  ? aspirin 81 MG chewable tablet Chew 1 tablet (81 mg total) by mouth daily. 30 tablet 0  ? atorvastatin (LIPITOR) 40 MG tablet TAKE 1 TABLET(40 MG) BY MOUTH DAILY 90 tablet 0  ? Balsam [place]-Cast[place] Oil [facility]) OINT Special Instructions: apply to DTI Right &  Left medial buttock Every Shift; Day    ? NON FORMULARY Diet - Dysphagia 3    ? ondansetron (ZOFRAN) 4 MG tablet Take 1 tablet (4 mg total) by mouth daily as needed for nausea or vomiting. 30 tablet 1  ? pantoprazole (PROTONIX) 40 MG tablet Take 1 tablet (40 mg total) by mouth daily. 30 tablet 0  ? vitamin B-12 (CYANOCOBALAMIN) 1000 MCG tablet Take 1,000 mcg by mouth daily. for level 204    ? ascorbic acid (VITAMIN C) 500 MG tablet Take 1 tablet (500 mg total) by mouth daily. (Patient not taking: Reported on 09/22/2021) 30 tablet 0  ? calcium citrate (CALCITRATE - DOSED IN MG ELEMENTAL CALCIUM) 950 (200 Ca) MG tablet Take 1 tablet (200 mg of elemental calcium total) by mouth 2 (two) times daily. (Patient not taking: Reported on 09/22/2021)    ? cholecalciferol (VITAMIN D) 25 MCG tablet Take 2 tablets (2,000 Units total) by mouth 2 (two) times daily. (Patient not taking: Reported on 09/22/2021) 60 tablet 0  ? metoprolol tartrate (LOPRESSOR) 25 MG tablet Take 1.5 tablets (37.5 mg total) by mouth 2 (two) times daily. (Patient not taking: Reported on 09/22/2021) 270 tablet 3  ? oxyCODONE (ROXICODONE) 5 MG immediate release tablet Take 1 tablet (5 mg total) by mouth every 6 (six) hours as needed for up to 10 doses for severe pain. (Patient not taking: Reported on 09/09/2021) 10 tablet 0  ? XARELTO 15 MG TABS tablet TAKE 1 TABLET(15 MG) BY MOUTH DAILY WITH DINNER (Patient not  taking: Reported on 09/22/2021) 30 tablet 5  ? ?No facility-administered medications prior to visit.  ? ? ? ? ?PHYSICAL EXAM ? ?GENERAL EXAM/CONSTITUTIONAL: ?Vitals:  ?Vitals:  ? 09/22/21 1509  ?BP: (!) 151/81  ?Pulse: 64  ?Weight: 114 lb 3.2 oz (51.8 kg)  ?Height: '5\' 5"'$  (1.651 m)  ? ?Body mass index is 19 kg/m?. ?Wt Readings from Last 3 Encounters:  ?09/22/21 114 lb 3.2 oz (51.8 kg)  ?09/09/21 113 lb 6.4 oz (51.4 kg)  ?08/25/21 113 lb 3.2 oz (51.3 kg)  ? ?Patient is in no distress; well developed, nourished and groomed; neck is  supple ? ?CARDIOVASCULAR: ?Examination of carotid arteries is normal; no carotid bruits ?Regular rate and rhythm, no murmurs ?Examination of peripheral vascular system by observation and palpation is normal ? ?EYES: ?Ophthalmoscopic exam of optic discs and posterior segments is normal; no papilledema or hemorrhages ?No results found. ? ?MUSCULOSKELETAL: ?Gait, strength, tone, movements noted in Neurologic exam below ? ?NEUROLOGIC: ?MENTAL STATUS:  ?   ? View : No data to display.  ?  ?  ?  ? ?awake, alert, oriented to person ?MILD MOTOR APRAXIA ?language fluent, comprehension intact, naming intact ?fund of knowledge appropriate ? ?CRANIAL NERVE:  ?2nd, 3rd, 4th, 6th - pupils equal and reactive to light, visual fields full to confrontation, extraocular muscles intact, no nystagmus ?5th - facial sensation symmetric ?7th - facial strength symmetric ?8th - hearing intact ?9th - palate elevates symmetrically, uvula midline ?11th - shoulder shrug symmetric ?12th - tongue protrusion midline ? ?MOTOR:  ?normal bulk and tone, SYMM 4+ strength in the BUE, BLE ?MILD RESTING TREMOR IN LUE ? ?SENSORY:  ?normal and symmetric to light touch ? ?COORDINATION:  ?finger-nose-finger, fine finger movements SLOW ? ?REFLEXES:  ?deep tendon reflexes TRACE and symmetric ? ?GAIT/STATION:  ?In wheelchair ? ? ? ? ?DIAGNOSTIC DATA (LABS, IMAGING, TESTING) ?- I reviewed patient records, labs, notes, testing and imaging myself where available. ? ?Lab Results  ?Component Value Date  ? WBC 9.6 09/01/2021  ? HGB 9.0 (L) 09/01/2021  ? HCT 28.9 (L) 09/01/2021  ? MCV 94.4 09/01/2021  ? PLT 340 09/01/2021  ? ?   ?Component Value Date/Time  ? NA 136 09/01/2021 0700  ? NA 141 09/26/2019 1206  ? K 4.1 09/01/2021 0700  ? CL 100 09/01/2021 0700  ? CO2 29 09/01/2021 0700  ? GLUCOSE 87 09/01/2021 0700  ? BUN 14 09/01/2021 0700  ? BUN 16 09/26/2019 1206  ? CREATININE 0.78 09/01/2021 0700  ? CALCIUM 8.3 (L) 09/01/2021 0700  ? PROT 5.7 (L) 09/01/2021 0700  ? PROT  7.4 09/13/2020 1556  ? ALBUMIN 2.7 (L) 09/01/2021 0700  ? ALBUMIN 4.7 (H) 09/13/2020 1556  ? AST 23 09/01/2021 0700  ? ALT 20 09/01/2021 0700  ? ALKPHOS 147 (H) 09/01/2021 0700  ? BILITOT 1.2 09/01/2021 0700  ? BILITOT 0.6 09/13/2020 1556  ? GFRNONAA >60 09/01/2021 0700  ? GFRAA 50 (L) 09/26/2019 1206  ? ?Lab Results  ?Component Value Date  ? CHOL 163 08/01/2017  ? HDL 75.50 08/01/2017  ? [place] 74 08/01/2017  ? LDLDIRECT 64.0 08/17/2021  ? TRIG 67.0 08/01/2017  ? CHOLHDL 2 08/01/2017  ? ?Lab Results  ?Component Value Date  ? HGBA1C 5.7 (H) 08/17/2021  ? ?Lab Results  ?Component Value Date  ? ZOXWRUEA54 204 08/23/2021  ? ?Lab Results  ?Component Value Date  ? TSH 1.864 08/18/2021  ? ? ? ? ? ?ASSESSMENT AND PLAN ? ?88 y.o. year  old female here with: ? ? ?Dx: ? ?1. Cerebrovascular accident (CVA) due to embolism of left middle cerebral artery ([place])   ?2. PAF (paroxysmal atrial fibrillation) ([place])   ?3. Primary hypertension   ?4. Closed displaced fracture of right femoral neck ([place])   ? ? ? ? ?PLAN: ? ?Stroke:  Lt MCA infarct in parietal lobe likely secondary cardio embolic source given patient history of atrial fibrillation ?Code Stroke CT head No acute abnormality.  ?CTA head & neck Multifocal narrowing in the bilateral PCAs. No other hemodynamically significant intracranial stenosis. Approximately 50% proximal left ICA stenosis  ?CT perfusion - posterior left MCA territory, which correlates with an area with decreased contrast enhancement in the MCA branches. While there is mild to moderate narrowing in the distal left M1 segment, no additional focal stenosis or occlusion is seen in the left MCA branches. No infarct core. ?MRI brain ?1. Moderately motion limited study with evidence of acute left ?frontal and parietal infarcts. The left parietal infarct is along ?the posterior aspect of a remote left parietal infarct. ?2. Moderate chronic microvascular ischemic disease. ?3.  Cerebral atrophy (ICD10-G31.9). ?2D Echo  EF >76%, grade 1 diastolic dysfunction, no atrial shunt ?LDL 64 ?HgbA1c 5.7 ?Continue aspirin 81 (now off xarelto and off statin after family discussion and goals of care; continue palliative care approach) ?  ?Return for return to [facility]

## 2021-09-22 NOTE — Patient Instructions (Signed)
Continue aspirin 81 (now off xarelto and off statin after family discussion and goals of care; continue palliative care approach) ?

## 2021-09-23 ENCOUNTER — Non-Acute Institutional Stay (SKILLED_NURSING_FACILITY): Payer: Medicare Other | Admitting: Adult Health

## 2021-09-23 ENCOUNTER — Encounter: Payer: Self-pay | Admitting: Adult Health

## 2021-09-23 DIAGNOSIS — S72001A Fracture of unspecified part of neck of right femur, initial encounter for closed fracture: Secondary | ICD-10-CM | POA: Diagnosis not present

## 2021-09-23 DIAGNOSIS — K219 Gastro-esophageal reflux disease without esophagitis: Secondary | ICD-10-CM | POA: Diagnosis not present

## 2021-09-23 DIAGNOSIS — I495 Sick sinus syndrome: Secondary | ICD-10-CM | POA: Diagnosis not present

## 2021-09-23 DIAGNOSIS — I482 Chronic atrial fibrillation, unspecified: Secondary | ICD-10-CM | POA: Diagnosis not present

## 2021-09-23 NOTE — Progress Notes (Signed)
?Location:  [facility] ?Nursing Home Room Number: 155-P ?Place of Service:  SNF (31) ? ? ?CODE STATUS: DNR ? ?Allergies  ?Allergen Reactions  ? Septra Ds [Sulfamethoxazole-Trimethoprim]   ? Lisinopril Other (See Comments)  ?  Cough  ? ? ?Chief Complaint  ?Patient presents with  ? Medical Management of Chronic Issues ? ?                     History of CVA(cerebral vascular accient)  Atrial fibrillation / sick sinus syndrome:  Primary hypertension:  Gastroesophageal reflux disease without esophagitis: Closed displaced fracture of right femoral neck:  ? ? ?HPI: ? ?She is a 88 year old resident of this facility being seen for the management of her chronic illnesses:  History of CVA(cerebral vascular accient)  Atrial fibrillation / sick sinus syndrome:  Primary hypertension:  Gastroesophageal reflux disease without esophagitis: Closed displaced fracture of right femoral neck:. There are no reports of uncontrolled pain. No reports of significant changes in weight.  ? ?Past Medical History:  ?Diagnosis Date  ? Acute metabolic encephalopathy 28/3151  ? Atrial fibrillation ([place])   ? Bleeding stomach ulcer 1980s?  ? Cancer ([place]) Meloanoma left leg  ? CVA (cerebral vascular accident) ([place]) 07/2021  ? DIVERTICULOSIS, COLON 11/22/2006  ? Headache(784.0)   ? "very often; not regular" (11/25/2015)  ? Heart block   ? hx of  ? Hip fracture ([place]) 07/2021  ? History of blood transfusion 1980s  ? "when I had the bleeding ulcer"  ? HYPERLIPIDEMIA 11/22/2006  ? Hypertension   ? Melanoma ([place])   ? Menopausal syndrome (hot flashes)   ? OSTEOPOROSIS 11/22/2006  ? Presence of permanent cardiac pacemaker   ? Vitamin D deficiency 08/21/2021  ? ? ?Past Surgical History:  ?Procedure Laterality Date  ? APPENDECTOMY    ? BUNIONECTOMY Right   ? CATARACT EXTRACTION W/ INTRAOCULAR LENS  IMPLANT, BILATERAL Bilateral 2000s  ? CHOLECYSTECTOMY OPEN    ? EP IMPLANTABLE DEVICE N/A 11/25/2015  ? Procedure: Pacemaker Implant;  Surgeon: Will Meredith Leeds, MD;  Location: [facility];  Service: Cardiovascular;  Laterality: N/A;  ? FEMUR IM NAIL Left 07/08/2014  ? Procedure: INTRAMEDULLARY (IM) NAIL ;  Surgeon: Meredith Pel, MD;  Location: [facility];  Service: Orthopedics;  Laterality: Left;  ? FRACTURE SURGERY    ? HIP ARTHROPLASTY Right 08/20/2021  ? Procedure: ARTHROPLASTY BIPOLAR HIP (HEMIARTHROPLASTY);  Surgeon: Altamese Elrod, MD;  Location: [facility];  Service: Orthopedics;  Laterality: Right;  ? IR KYPHO LUMBAR INC FX REDUCE BONE BX UNI/BIL CANNULATION INC/IMAGING  03/21/2021  ? PACEMAKER INSERTION    ? REVISION TOTAL HIP ARTHROPLASTY Left 06/2014  ? ? ?Social History  ? ?Socioeconomic History  ? Marital status: Widowed  ?  Spouse name: Not on file  ? Number of children: 2  ? Years of education: 88  ? Highest education level: Not on file  ?Occupational History  ?  Comment: retired  ?Tobacco Use  ? Smoking status: Never  ?  Passive exposure: Never  ? Smokeless tobacco: Never  ?Vaping Use  ? Vaping Use: Never used  ?Substance and Sexual Activity  ? Alcohol use: No  ?  Alcohol/week: 0.0 standard drinks  ? Drug use: No  ? Sexual activity: Never  ?Other Topics Concern  ? Not on file  ?Social History Narrative  ? 09/21/21 resides at [place]  ?   ? Lives alone, son lives beside her  ?  Caffeine - none  ? ?Social Determinants of Health  ? ?Financial Resource Strain: Not on file  ?Food Insecurity: Not on file  ?Transportation Needs: Not on file  ?Physical Activity: Not on file  ?Stress: Not on file  ?Social Connections: Not on file  ?Intimate Partner Violence: Not on file  ? ?Family History  ?Problem Relation Age of Onset  ? Alzheimer's disease Sister   ? Cardiomyopathy Brother   ? ? ? ? ?VITAL SIGNS ?BP (!) 124/54   Pulse 60   Temp (!) 97.5 ?F (36.4 ?C)   Resp 20   Ht '5\' 5"'$  (1.651 m)   Wt 114 lb 3.2 oz (51.8 kg)   BMI 19.00 kg/m?  ? ?Outpatient Encounter Medications as of 09/23/2021  ?Medication Sig  ? Acetaminophen (TYLENOL ARTHRITIS PAIN PO) Take 650  mg by mouth every 6 (six) hours.  ? amiodarone (PACERONE) 200 MG tablet TAKE 1 TABLET(200 MG) BY MOUTH DAILY  ? aspirin 81 MG chewable tablet Chew 1 tablet (81 mg total) by mouth daily.  ? Personal assistant [facility]) OINT Special Instructions: apply to DTI Right & Left medial buttock Every Shift; Day  ? nitrofurantoin, macrocrystal-monohydrate, (MACROBID) 100 MG capsule Take 100 mg by mouth at bedtime. UTI prophylaxis  ? NON FORMULARY Diet - Dysphagia 3  ? ondansetron (ZOFRAN) 4 MG tablet Take 1 tablet (4 mg total) by mouth daily as needed for nausea or vomiting.  ? senna (SENOKOT) 8.6 MG TABS tablet Take 1 tablet by mouth in the morning and at bedtime.  ? pantoprazole (PROTONIX) 40 MG tablet Take 1 tablet (40 mg total) by mouth daily.  ? [DISCONTINUED] acetaminophen (TYLENOL) 500 MG tablet Take 1 tablet (500 mg total) by mouth every 6 (six) hours as needed. (Patient taking differently: Take 650 mg by mouth every 6 (six) hours as needed.)  ? [DISCONTINUED] atorvastatin (LIPITOR) 40 MG tablet TAKE 1 TABLET(40 MG) BY MOUTH DAILY  ? [DISCONTINUED] vitamin B-12 (CYANOCOBALAMIN) 1000 MCG tablet Take 1,000 mcg by mouth daily. for level 204  ? ?No facility-administered encounter medications on file as of 09/23/2021.  ? ? ? ?SIGNIFICANT DIAGNOSTIC EXAMS ? ?PREVIOUS  ? ?08-17-21: ct head stroke:  ?1. No acute CT finding. Atrophy and chronic small-vessel ischemic changes. 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 ?2. ASPECTS is 10, allowing for the old infarction ? ?08-17-21: ct angio of head:  ?1. Decreased perfusion (Tmax> 6 seconds) in the posterior left MCA territory, which correlates with an area with decreased contrast enhancement in the MCA branches. While there is mild to moderate ?narrowing in the distal left M1 segment, no additional focal stenosis or occlusion is seen in the left MCA branches. No infarct core. ?2. Multifocal narrowing in the bilateral PCAs. No other hemodynamically significant intracranial  stenosis. ?3. Approximately 50% proximal left ICA stenosis, unchanged. No additional hemodynamically significant stenosis in the neck. ? ?08-17-21: right hip x-ray: Acute displaced right femoral neck fracture.  ? ?08-18-21: MRI of brain ?1. Moderately motion limited study with evidence of acute left frontal and parietal infarcts. The left parietal infarct is along the posterior aspect of a remote left parietal infarct. ?2. Moderate chronic microvascular ischemic disease. ?3.  Cerebral atrophy  ? ?NO NEW EXAMS.  ? ?LABS REVIEWED; PREVIOUS  ? ?08-17-21: wbc 19.6 hgb 14.3; hct 45.2 mcv 94.8 plt 209; glucose 144; bun 15; creat 1.22 k+ 3.8; na++ 137; ca 9.1; GFR 42; protein 6.7; albumin 3.8 hgb a1c 5.7 ?08-18-21: urine culture <10,000. Vitamin B 12:  232; tsh 1.864 ?08-21-21: vit D 12.78 ?08-23-21: vitamin B 12: 204; iron 22; tibc 185; ferritin 234;  ?08-24-21: wbc 9.2; hgb 9.1; hct 27.0; mcv 90.0 plt 154; glucose 104; bun 17; creat 0.76; k + 3.8; na++ 136; ca 8.1; GFR >60 ? ?NO NEW LABS.  ? ?Review of Systems  ?Constitutional:  Negative for malaise/fatigue.  ?Respiratory:  Negative for cough and shortness of breath.   ?Cardiovascular:  Negative for chest pain, palpitations and leg swelling.  ?Gastrointestinal:  Negative for abdominal pain, constipation and heartburn.  ?Musculoskeletal:  Negative for back pain, joint pain and myalgias.  ?Skin: Negative.   ?Neurological:  Negative for dizziness.  ?Psychiatric/Behavioral:  The patient is not nervous/anxious.   ? ?Physical Exam ?Constitutional:   ?   General: She is not in acute distress. ?   Appearance: She is well-developed. She is not diaphoretic.  ?Neck:  ?   Thyroid: No thyromegaly.  ?Cardiovascular:  ?   Rate and Rhythm: Normal rate and regular rhythm.  ?   Pulses: Normal pulses.  ?   Heart sounds: Murmur heard.  ?   Comments: Psychologist, forensic ?Pulmonary:  ?   Effort: Pulmonary effort is normal. No respiratory distress.  ?   Breath sounds: Normal breath sounds.  ?Abdominal:  ?    General: Bowel sounds are normal. There is no distension.  ?   Palpations: Abdomen is soft.  ?   Tenderness: There is no abdominal tenderness.  ?Musculoskeletal:  ?   Cervical back: Neck supple.  ?   Right lower leg: No ede

## 2021-09-30 ENCOUNTER — Other Ambulatory Visit (HOSPITAL_COMMUNITY)
Admission: RE | Admit: 2021-09-30 | Discharge: 2021-09-30 | Disposition: A | Discharge status: Home / Self Care | Payer: Medicare Other | Source: Skilled Nursing Facility | Attending: Adult Health | Admitting: Adult Health

## 2021-09-30 DIAGNOSIS — E039 Hypothyroidism, unspecified: Secondary | ICD-10-CM | POA: Insufficient documentation

## 2021-09-30 LAB — TSH: TSH: 4.221 u[IU]/mL (ref 0.350–4.500)

## 2021-10-05 DIAGNOSIS — S72031D Displaced midcervical fracture of right femur, subsequent encounter for closed fracture with routine healing: Secondary | ICD-10-CM | POA: Diagnosis not present

## 2021-10-18 ENCOUNTER — Encounter: Payer: Medicare Other | Admitting: Cardiology

## 2021-10-18 ENCOUNTER — Other Ambulatory Visit (HOSPITAL_COMMUNITY)
Admission: RE | Admit: 2021-10-18 | Discharge: 2021-10-18 | Disposition: A | Discharge status: Home / Self Care | Payer: Medicare Other | Source: Skilled Nursing Facility | Attending: Adult Health | Admitting: Adult Health

## 2021-10-18 DIAGNOSIS — N1832 Chronic kidney disease, stage 3b: Secondary | ICD-10-CM | POA: Insufficient documentation

## 2021-10-18 LAB — URINALYSIS, ROUTINE W REFLEX MICROSCOPIC
Bilirubin Urine: NEGATIVE
Glucose, UA: NEGATIVE mg/dL
Hgb urine dipstick: NEGATIVE
Ketones, ur: NEGATIVE mg/dL
Nitrite: NEGATIVE
Protein, ur: NEGATIVE mg/dL
Specific Gravity, Urine: 1.014 (ref 1.005–1.030)
pH: 5 (ref 5.0–8.0)

## 2021-10-20 ENCOUNTER — Encounter: Payer: Self-pay | Admitting: Adult Health

## 2021-10-20 ENCOUNTER — Non-Acute Institutional Stay (SKILLED_NURSING_FACILITY): Payer: Medicare Other | Admitting: Adult Health

## 2021-10-20 DIAGNOSIS — R109 Unspecified abdominal pain: Secondary | ICD-10-CM

## 2021-10-20 NOTE — Progress Notes (Signed)
Location:  [place] Room Number: 155-P Place of Service:  SNF (31)   CODE STATUS: DNR  Allergies  Allergen Reactions   Septra Ds [Sulfamethoxazole-Trimethoprim]    Lisinopril Other (See Comments)    Cough    Chief Complaint  Patient presents with   Acute Visit    Right flank pain    HPI:  For the past several days she has been having right flank pain. She describes is as sharp and achy. No radiation present. Hurts worse with inspiration. She denies any constipation; no uti symptoms such as dysuria; or frequency. Her urine culture grew only 40,000 colonies   Past Medical History:  Diagnosis Date   Acute metabolic encephalopathy 52/8413   Atrial fibrillation (HCC)    Bleeding stomach ulcer 1980s?   Cancer ([place]) Meloanoma left leg   CVA (cerebral vascular accident) ([place]) 07/2021   DIVERTICULOSIS, COLON 11/22/2006   Headache(784.0)    "very often; not regular" (11/25/2015)   Heart block    hx of   Hip fracture ([place]) 07/2021   History of blood transfusion 1980s   "when I had the bleeding ulcer"   HYPERLIPIDEMIA 11/22/2006   Hypertension    Melanoma ([place])    Menopausal syndrome (hot flashes)    OSTEOPOROSIS 11/22/2006   Presence of permanent cardiac pacemaker    Vitamin D deficiency 08/21/2021    Past Surgical History:  Procedure Laterality Date   APPENDECTOMY     BUNIONECTOMY Right    CATARACT EXTRACTION W/ INTRAOCULAR LENS  IMPLANT, BILATERAL Bilateral 2000s   CHOLECYSTECTOMY OPEN     EP IMPLANTABLE DEVICE N/A 11/25/2015   Procedure: Pacemaker Implant;  Surgeon: Will Meredith Leeds, MD;  Location: [facility];  Service: Cardiovascular;  Laterality: N/A;   FEMUR IM NAIL Left 07/08/2014   Procedure: INTRAMEDULLARY (IM) NAIL ;  Surgeon: Meredith Pel, MD;  Location: [facility];  Service: Orthopedics;  Laterality: Left;   FRACTURE SURGERY     HIP ARTHROPLASTY Right 08/20/2021   Procedure: ARTHROPLASTY BIPOLAR HIP (HEMIARTHROPLASTY);   Surgeon: Altamese Salem, MD;  Location: [facility];  Service: Orthopedics;  Laterality: Right;   IR KYPHO LUMBAR INC FX REDUCE BONE BX UNI/BIL CANNULATION INC/IMAGING  03/21/2021   PACEMAKER INSERTION     REVISION TOTAL HIP ARTHROPLASTY Left 06/2014    Social History   Socioeconomic History   Marital status: Widowed    Spouse name: Not on file   Number of children: 2   Years of education: 18   Highest education level: Not on file  Occupational History    Comment: retired  Tobacco Use   Smoking status: Never    Passive exposure: Never   Smokeless tobacco: Never  Vaping Use   Vaping Use: Never used  Substance and Sexual Activity   Alcohol use: No    Alcohol/week: 0.0 standard drinks   Drug use: No   Sexual activity: Never  Other Topics Concern   Not on file  Social History Narrative   09/21/21 resides at [place] alone, son lives beside her   Caffeine - none   Social Determinants of Health   Financial Resource Strain: Not on file  Food Insecurity: Not on file  Transportation Needs: Not on file  Physical Activity: Not on file  Stress: Not on file  Social Connections: Not on file  Intimate Partner Violence: Not on file   Family History  Problem Relation Age of Onset  Alzheimer's disease Sister    Cardiomyopathy Brother       VITAL SIGNS BP (!) 162/69   Pulse 64   Temp (!) 97.5 F (36.4 C)   Ht '5\' 5"'$  (1.651 m)   Wt 108 lb 9.6 oz (49.3 kg)   BMI 18.07 kg/m   Outpatient Encounter Medications as of 10/20/2021  Medication Sig   Acetaminophen (TYLENOL ARTHRITIS PAIN PO) Take 650 mg by mouth every 6 (six) hours.   amiodarone (PACERONE) 200 MG tablet TAKE 1 TABLET(200 MG) BY MOUTH DAILY   aspirin 81 MG chewable tablet Chew 1 tablet (81 mg total) by mouth daily.   Balsam [place]-Castor Oil [facility]) OINT Special Instructions: apply to DTI Right & Left medial buttock Every Shift; Day   lidocaine 4 % Place 1 patch onto the skin. place on in the AM and remove at  [facility] for right flank pain   nitrofurantoin, macrocrystal-monohydrate, (MACROBID) 100 MG capsule Take 100 mg by mouth at bedtime. UTI prophylaxis   NON FORMULARY Diet - Dysphagia 3   ondansetron (ZOFRAN) 4 MG tablet Take 1 tablet (4 mg total) by mouth daily as needed for nausea or vomiting.   senna (SENOKOT) 8.6 MG TABS tablet Take 1 tablet by mouth in the morning and at bedtime.   pantoprazole (PROTONIX) 40 MG tablet Take 1 tablet (40 mg total) by mouth daily.   No facility-administered encounter medications on file as of 10/20/2021.     SIGNIFICANT DIAGNOSTIC EXAMS  PREVIOUS   08-17-21: ct head stroke:  1. No acute CT finding. Atrophy and chronic small-vessel ischemic changes. 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 2. ASPECTS is 10, allowing for the old infarction  08-17-21: ct angio of head:  1. Decreased perfusion (Tmax> 6 seconds) in the posterior left MCA territory, which correlates with an area with decreased contrast enhancement in the MCA branches. While there is mild to moderate narrowing in the distal left M1 segment, no additional focal stenosis or occlusion is seen in the left MCA branches. No infarct core. 2. Multifocal narrowing in the bilateral PCAs. No other hemodynamically significant intracranial stenosis. 3. Approximately 50% proximal left ICA stenosis, unchanged. No additional hemodynamically significant stenosis in the neck.  08-17-21: right hip x-ray: Acute displaced right femoral neck fracture.   08-18-21: MRI of brain 1. Moderately motion limited study with evidence of acute left frontal and parietal infarcts. The left parietal infarct is along the posterior aspect of a remote left parietal infarct. 2. Moderate chronic microvascular ischemic disease. 3.  Cerebral atrophy   NO NEW EXAMS.   LABS REVIEWED; PREVIOUS   08-17-21: wbc 19.6 hgb 14.3; hct 45.2 mcv 94.8 plt 209; glucose 144; bun 15; creat 1.22 k+ 3.8; na++ 137; ca 9.1; GFR 42; protein 6.7; albumin 3.8 hgb a1c  5.7 08-18-21: urine culture <10,000. Vitamin B 12: 232; tsh 1.864 08-21-21: vit D 12.78 08-23-21: vitamin B 12: 204; iron 22; tibc 185; ferritin 234;  08-24-21: wbc 9.2; hgb 9.1; hct 27.0; mcv 90.0 plt 154; glucose 104; bun 17; creat 0.76; k + 3.8; na++ 136; ca 8.1; GFR >60  NO NEW LABS.   Review of Systems  Constitutional:  Negative for malaise/fatigue.  Respiratory:  Negative for cough and shortness of breath.   Cardiovascular:  Negative for chest pain, palpitations and leg swelling.  Gastrointestinal:  Negative for abdominal pain, constipation and heartburn.  Musculoskeletal:  Positive for back pain. Negative for joint pain and myalgias.  Skin: Negative.   Neurological:  Negative for  dizziness.  Psychiatric/Behavioral:  The patient is not nervous/anxious.     Physical Exam Constitutional:      General: She is not in acute distress.    Appearance: She is well-developed. She is not diaphoretic.  Neck:     Thyroid: No thyromegaly.  Cardiovascular:     Rate and Rhythm: Normal rate and regular rhythm.     Heart sounds: Normal heart sounds.  Pulmonary:     Effort: Pulmonary effort is normal. No respiratory distress.     Breath sounds: Normal breath sounds.  Abdominal:     General: Bowel sounds are normal. There is no distension.     Palpations: Abdomen is soft.     Tenderness: There is no abdominal tenderness.  Musculoskeletal:        General: Normal range of motion.     Cervical back: Neck supple.     Right lower leg: No edema.     Left lower leg: No edema.     Comments: Has mild right flank tenderness present.  Has mild thoracic tenderness present.   Lymphadenopathy:     Cervical: No cervical adenopathy.  Skin:    General: Skin is warm and dry.  Neurological:     Mental Status: She is alert. Mental status is at baseline.  Psychiatric:        Mood and Affect: Mood normal.     ASSESSMENT/ PLAN:  TODAY  Right flank pain: will get KUB. And will begin lidoderm 4% patch  daily will monitor    Ok Edwards NP [facility]  call [phone]

## 2021-10-21 LAB — URINE CULTURE: Culture: 40000 — AB

## 2021-10-25 ENCOUNTER — Encounter: Payer: Self-pay | Admitting: Internal Medicine

## 2021-10-25 ENCOUNTER — Non-Acute Institutional Stay (SKILLED_NURSING_FACILITY): Payer: Medicare Other | Admitting: Internal Medicine

## 2021-10-25 DIAGNOSIS — N1832 Chronic kidney disease, stage 3b: Secondary | ICD-10-CM

## 2021-10-25 DIAGNOSIS — D649 Anemia, unspecified: Secondary | ICD-10-CM | POA: Diagnosis not present

## 2021-10-25 DIAGNOSIS — I480 Paroxysmal atrial fibrillation: Secondary | ICD-10-CM | POA: Diagnosis not present

## 2021-10-25 DIAGNOSIS — I10 Essential (primary) hypertension: Secondary | ICD-10-CM

## 2021-10-25 DIAGNOSIS — N179 Acute kidney failure, unspecified: Secondary | ICD-10-CM | POA: Diagnosis not present

## 2021-10-25 NOTE — Progress Notes (Signed)
NURSING HOME LOCATION:   [facility] ROOM NUMBER:  155 P  CODE STATUS:  DNR  PCP:  Grier Mitts MD  This is a nursing facility follow up visit of chronic medical diagnoses & to document compliance with Regulation 483.30 (c) in [employer] [place] Manual Phase 2 which mandates caregiver visit ( visits can alternate among physician, PA or NP as per statutes) within 10 days of 30 days / 60 days/ 90 days post admission to SNF date    Interim medical record and care since last SNF visit was updated with review of diagnostic studies and change in clinical status since last visit were documented.  HPI: She is a permanent resident of the facility with medical diagnoses of atrial fibrillation, history of bleeding gastric ulcer, history of melanoma, history of stroke, diverticulosis, dyslipidemia, essential hypertension, and history of heart block with placement of permanent cardiac pacemaker. Pacemaker was assessed 6/5 remotely.  Most current labs reveal calcium of 8.3, alkaline phosphatase of 147, albumin 2.7, and total protein of 5.7.  CKD stage II is present with a creatinine of 0.78 and GFR greater than 60.  Her anemia has improved serially with the most recent H/H of 9/28.9.  On 4/4 H/H was 7.6/23.8.  Current TSH is therapeutic with a value of 4.221.  Review of systems: Review of systems was essentially negative except for some constipation.  When I asked about anxiety or depression; her response was "not much" but then she added "I just want to go home".  When I asked the recommendations from her Neurologist  @ recent visit her ; response was "you mean my leg?".  She then stated that she did not know what he had recommended.  She denies any other active issues.  Constitutional: No fever, significant weight change, fatigue  Eyes: No redness, discharge, pain, vision change ENT/mouth: No nasal congestion, purulent discharge, earache, change in hearing, sore throat   Cardiovascular: No chest pain, palpitations, paroxysmal nocturnal dyspnea, claudication, edema  Respiratory: No cough, sputum production, hemoptysis, DOE, significant snoring, apnea Gastrointestinal: No heartburn, dysphagia, abdominal pain, nausea /vomiting, rectal bleeding, melena Genitourinary: No dysuria, hematuria, pyuria, incontinence, nocturia Musculoskeletal: No joint stiffness, joint swelling, weakness, pain Dermatologic: No rash, pruritus, change in appearance of skin Neurologic: No dizziness, headache, syncope, seizures, numbness, tingling Psychiatric: No significant insomnia, anorexia Endocrine: No change in hair/skin/nails, excessive thirst, excessive hunger, excessive urination  Hematologic/lymphatic: No significant bruising, lymphadenopathy, abnormal bleeding Allergy/immunology: No itchy/watery eyes, significant sneezing, urticaria, angioedema  Physical exam:  Pertinent or positive findings: She appears her stated age.  There is thinning over the crown.  She has slight ptosis greater on the left than the right.  The first heart sound is increased at the lower left sternal border.  She has coarse rales in the right lower lobe greater than the left lower lobe. Trace edema is noted at the sock line.  She has PIP arthritic changes greater in the right hand than the left.  Limb atrophy is present.  There is marked interosseous wasting of the hands.  She exhibits a coarse tremor in the left upper extremity greater than the right which is essentially constant.  There is bruising over the base of the left thumb.  General appearance: no acute distress, increased work of breathing is present.   Lymphatic: No lymphadenopathy about the head, neck, axilla. Eyes: No conjunctival inflammation or lid edema is present. There is no scleral icterus. Ears:  External ear exam  shows no significant lesions or deformities.   Nose:  External nasal examination shows no deformity or inflammation. Nasal mucosa  are pink and moist without lesions, exudates Oral exam: Lips and gums are healthy appearing.There is no oropharyngeal erythema or exudate. Neck:  No thyromegaly, masses, tenderness noted.    Heart:  Normal rate and regular rhythm. S2 normal without gallop, murmur, click, rub.  Lungs: without wheezes, rhonchi, rubs. Abdomen: Bowel sounds are normal.  Abdomen is soft and nontender with no organomegaly, hernias, masses. GU: Deferred  Extremities:  No cyanosis, clubbing. Neurologic exam:  Balance, Rhomberg, finger to nose testing could not be completed due to clinical state Skin: Warm & dry w/o tenting. No significant lesions or rash.  See clinical summary under each active problem in the Problem List with associated updated therapeutic plan

## 2021-10-26 ENCOUNTER — Encounter: Payer: Self-pay | Admitting: Internal Medicine

## 2021-10-26 DIAGNOSIS — D649 Anemia, unspecified: Secondary | ICD-10-CM | POA: Insufficient documentation

## 2021-10-26 NOTE — Assessment & Plan Note (Addendum)
Serially blood pressures are variable ranging from a low of 124/54 up to 162/69.  On that occasion she was having right flank pain.  Neurology has not recommended permissive hypertension; her stroke was apparently embolic in nature.  Discuss adding low dose B blocker with NP.

## 2021-10-26 NOTE — Patient Instructions (Signed)
See assessment and plan under each diagnosis in the problem list and acutely for this visit 

## 2021-10-26 NOTE — Assessment & Plan Note (Signed)
Although she has a history of B12 deficiency; indices are normochromic, normocytic.  Serially there has been improvement in the anemia with rise from 7.6/23.8 on 08/27/2021 to the most recent values of 9/28.9.  No bleeding dyscrasias reported by staff.  Continue to monitor CBC.

## 2021-10-26 NOTE — Assessment & Plan Note (Addendum)
Currently rhythm is regular and rate is well controlled.Low dose B blocker may be preventive.

## 2021-10-26 NOTE — Assessment & Plan Note (Addendum)
Currently creatinine is 0.78 with GFR greater than 60 indicating CKD stage II.  No change in medical regimen (PPI)  indicated unless there is progression of CKD with a GFR less than 30.

## 2021-11-02 ENCOUNTER — Non-Acute Institutional Stay (SKILLED_NURSING_FACILITY): Payer: Medicare Other | Admitting: Adult Health

## 2021-11-02 DIAGNOSIS — Z0000 Encounter for general adult medical examination without abnormal findings: Secondary | ICD-10-CM

## 2021-11-02 NOTE — Patient Instructions (Signed)
  [name] Small , Thank you for taking time to come for your Medicare Wellness Visit. I appreciate your ongoing commitment to your health goals. Please review the following plan we discussed and let me know if I can assist you in the future.   These are the goals we discussed:  Goals      Absence of Fall and Fall-Related Injury     Evidence-based guidance:  Assess fall risk using a validated tool when available. Consider balance and gait impairment, muscle weakness, diminished vision or hearing, environmental hazards, presence of urinary or bowel urgency and/or incontinence.  Communicate fall injury risk to interprofessional healthcare team.  Develop a fall prevention plan with the patient and family.  Promote use of personal vision and auditory aids.  Promote reorientation, appropriate sensory stimulation, and routines to decrease risk of fall when changes in mental status are present.  Assess assistance level required for safe and effective self-care; consider referral for home care.  Encourage physical activity, such as performance of self-care at highest level of ability, strength and balance exercise program, and provision of appropriate assistive devices; refer to rehabilitation therapy.  Refer to community-based fall prevention program where available.  If fall occurs, determine the cause and revise fall injury prevention plan.  Regularly review medication contribution to fall risk; consider risk related to polypharmacy and age.  Refer to pharmacist for consultation when concerns about medications are revealed.  Balance adequate pain management with potential for oversedation.  Provide guidance related to environmental modifications.  Consider supplementation with Vitamin D.   Notes:      DIET - INCREASE WATER INTAKE     General - Client will not be readmitted within 30 days ([facility])        This is a list of the screening recommended for you and due dates:  Health Maintenance  Topic  Date Due   Tetanus Vaccine  03/28/2022*   Zoster (Shingles) Vaccine (2 of 2) 11/11/2021   COVID-19 Vaccine (4 - Booster for Pfizer series) 12/06/2021   Flu Shot  12/20/2021   Pneumonia Vaccine  Completed   DEXA scan (bone density measurement)  Completed   HPV Vaccine  Aged Out   Mammogram  Discontinued  *Topic was postponed. The date shown is not the original due date.

## 2021-11-02 NOTE — Progress Notes (Signed)
Subjective:   [name] Small is a 88 y.o. female who presents for Medicare Annual (Subsequent) preventive examination.  Review of Systems    Review of Systems  Constitutional:  Negative for malaise/fatigue.  Respiratory:  Negative for cough and shortness of breath.   Cardiovascular:  Negative for chest pain, palpitations and leg swelling.  Gastrointestinal:  Negative for abdominal pain, constipation and heartburn.  Musculoskeletal:  Negative for back pain, joint pain and myalgias.  Skin: Negative.   Neurological:  Negative for dizziness.  Psychiatric/Behavioral:  The patient is not nervous/anxious.     Cardiac Risk Factors include: advanced age (>1mn, >>42women);dyslipidemia;sedentary lifestyle     Objective:    Today's Vitals   11/02/21 1421  BP: (!) 150/64  Pulse: 60  Temp: 97.8 F (36.6 C)  Weight: 108 lb 9.6 oz (49.3 kg)  Height: '5\' 5"'$  (1.651 m)   Body mass index is 18.07 kg/m.     10/20/2021    3:40 PM 09/23/2021    9:01 AM 09/09/2021    9:37 AM 08/25/2021    9:16 AM 08/17/2021    7:14 PM 08/10/2021    3:59 PM 08/02/2021    6:20 PM  Advanced Directives  Does Patient Have a Medical Advance Directive? Yes Yes Yes Yes No No No  Type of AParamed[place]of [place] of facility DNR (pink MOST or yellow form) [place] of facility DNR (pink MOST or yellow form) H[place]Out of facility DNR (pink MOST or yellow form) HMiddletonOut of facility DNR (pink MOST or yellow form)     Does patient want to make changes to medical advance directive? No - Patient declined No - Patient declined No - Patient declined No - Patient declined     Copy of HM[place]in Chart? Yes - validated most recent copy scanned in chart (See row information) Yes - validated most recent copy scanned in chart (See row information) Yes - validated most recent copy scanned in chart (See row information) Yes -  validated most recent copy scanned in chart (See row information)     Would patient like information on creating a medical advance directive?      No - Patient declined No - Patient declined  Pre-existing out of facility DNR order (yellow form or pink MOST form) Pink MOST form placed in chart (order not valid for inpatient use) Pink MOST form placed in chart (order not valid for inpatient use)  Yellow form placed in chart (order not valid for inpatient use)       Current Medications (verified) Outpatient Encounter Medications as of 11/02/2021  Medication Sig   Acetaminophen (TYLENOL ARTHRITIS PAIN PO) Take 650 mg by mouth every 6 (six) hours.   amiodarone (PACERONE) 200 MG tablet TAKE 1 TABLET(200 MG) BY MOUTH DAILY   aspirin 81 MG chewable tablet Chew 1 tablet (81 mg total) by mouth daily.   Balsam [place]-Castor Oil ([facility] OINT Special Instructions: apply to DTI Right & Left medial buttock Every Shift; Day   lidocaine 4 % Place 1 patch onto the skin. place on in the AM [facility]   nitrofurantoin, macrocrystal-monohydrate, (MACROBID) 100 MG capsule Take 100 mg by mouth at bedtime. UTI prophylaxis   NON FORMULARY Diet - Dysphagia 3   ondansetron (ZOFRAN) 4 MG tablet Take 1 tablet (4 mg total) by mouth daily as needed for nausea or vomiting.  pantoprazole (PROTONIX) 40 MG tablet Take 1 tablet (40 mg total) by mouth daily.   senna (SENOKOT) 8.6 MG TABS tablet Take 1 tablet by mouth in the morning and at bedtime.   No facility-administered encounter medications on file as of 11/02/2021.    Allergies (verified) Septra ds [sulfamethoxazole-trimethoprim] and Lisinopril   History: Past Medical History:  Diagnosis Date   Acute metabolic encephalopathy 01/3234   Atrial fibrillation (HCC)    Bleeding stomach ulcer 1980s?   CVA (cerebral vascular accident) ([place]) 07/2021   DIVERTICULOSIS, COLON 11/22/2006   Headache(784.0)    "very often; not regular" (11/25/2015)    Heart block    hx of   Hip fracture ([place]) 07/2021   History of blood transfusion 1980s   "when I had the bleeding ulcer"   HYPERLIPIDEMIA 11/22/2006   Hypertension    Melanoma ([place])    LLE   Menopausal syndrome (hot flashes)    OSTEOPOROSIS 11/22/2006   Presence of permanent cardiac pacemaker    Vitamin D deficiency 08/21/2021   Past Surgical History:  Procedure Laterality Date   APPENDECTOMY     BUNIONECTOMY Right    CATARACT EXTRACTION W/ INTRAOCULAR LENS  IMPLANT, BILATERAL Bilateral 2000s   CHOLECYSTECTOMY OPEN     EP IMPLANTABLE DEVICE N/A 11/25/2015   Procedure: Pacemaker Implant;  Surgeon: Will Meredith Leeds, MD;  Location: [place] [facility];  Service: Cardiovascular;  Laterality: N/A;   FEMUR IM NAIL Left 07/08/2014   Procedure: INTRAMEDULLARY (IM) NAIL ;  Surgeon: Meredith Pel, MD;  Location: [facility];  Service: Orthopedics;  Laterality: Left;   FRACTURE SURGERY     HIP ARTHROPLASTY Right 08/20/2021   Procedure: ARTHROPLASTY BIPOLAR HIP (HEMIARTHROPLASTY);  Surgeon: Altamese Vowinckel, MD;  Location: [place];  Service: Orthopedics;  Laterality: Right;   IR KYPHO LUMBAR INC FX REDUCE BONE BX UNI/BIL CANNULATION INC/IMAGING  03/21/2021   PACEMAKER INSERTION     REVISION TOTAL HIP ARTHROPLASTY Left 06/2014   Family History  Problem Relation Age of Onset   Alzheimer's disease Sister    Cardiomyopathy Brother    Social History   Socioeconomic History   Marital status: Widowed    Spouse name: Not on file   Number of children: 2   Years of education: 43   Highest education level: Not on file  Occupational History    Comment: retired  Tobacco Use   Smoking status: Never    Passive exposure: Never   Smokeless tobacco: Never  Vaping Use   Vaping Use: Never used  Substance and Sexual Activity   Alcohol use: No    Alcohol/week: 0.0 standard drinks of alcohol   Drug use: No   Sexual activity: Never  Other Topics Concern   Not on file  Social History Narrative    09/21/21 resides at [place] alone, son lives beside her   Caffeine - none   Social Determinants of Health   Financial Resource Strain: Not on file  Food Insecurity: Not on file  Transportation Needs: Not on file  Physical Activity: Inactive (05/13/2020)   Exercise Vital Sign    Days of Exercise per Week: 0 days    Minutes of Exercise per Session: 0 min  Stress: Not on file  Social Connections: Socially Isolated (05/13/2020)   Social Connection and Isolation Panel [NHANES]    Frequency of Communication with Friends and Family: More than three times a week    Frequency of Social Gatherings with  Friends and Family: More than three times a week    Attends Religious Services: Never    Marine scientist or Organizations: No    Attends Archivist Meetings: Never    Marital Status: Widowed    Tobacco Counseling Counseling given: Not Answered   Clinical Intake:  Pre-visit preparation completed: Yes  Pain : Faces Faces Pain Scale: Hurts little more Pain Type: Chronic pain Pain Location: Back Pain Orientation: Lower Pain Descriptors / Indicators: Constant Pain Frequency: Constant  Faces Pain Scale: Hurts little more  BMI - recorded: 18.07 Nutritional Status: BMI <19  Underweight Nutritional Risks: Unintentional weight loss, Unintentional weight gain Diabetes: No  How often do you need to have someone help you when you read instructions, pamphlets, or other written materials from your doctor or pharmacy?: 5 - Always  Diabetic?no  Interpreter Needed?: No      Activities of Daily Living    11/02/2021    2:33 PM 08/10/2021    9:00 PM  In your present state of health, do you have any difficulty performing the following activities:  Hearing? 0 0  Vision? 0 0  Difficulty concentrating or making decisions? 1 1  Walking or climbing stairs? 1 1  Dressing or bathing? 1 1  Doing errands, shopping? 1 1  Preparing Food and eating ? Y   Using the  Toilet? Y   In the past six months, have you accidently leaked urine? Y   Do you have problems with loss of bowel control? Y   Managing your Medications? Y   Managing your Finances? Y   Housekeeping or managing your Housekeeping? Y     Patient Care Team: Gerlene Fee, NP as PCP - General (Geriatric Medicine) Josue Hector, MD as PCP - Cardiology (Cardiology) Constance Haw, MD as PCP - Electrophysiology (Cardiology)  Indicate any recent Medical Services you may have received from other than [facility] providers in the past year (date may be approximate).     Assessment:   This is a routine wellness examination for Rianne.  Hearing/Vision screen No results found.  Dietary issues and exercise activities discussed: Current Exercise Habits: The patient does not participate in regular exercise at present, Exercise limited by: None identified   Goals Addressed             This Visit's Progress    Absence of Fall and Fall-Related Injury       Evidence-based guidance:  Assess fall risk using a validated tool when available. Consider balance and gait impairment, muscle weakness, diminished vision or hearing, environmental hazards, presence of urinary or bowel urgency and/or incontinence.  Communicate fall injury risk to interprofessional healthcare team.  Develop a fall prevention plan with the patient and family.  Promote use of personal vision and auditory aids.  Promote reorientation, appropriate sensory stimulation, and routines to decrease risk of fall when changes in mental status are present.  Assess assistance level required for safe and effective self-care; consider referral for home care.  Encourage physical activity, such as performance of self-care at highest level of ability, strength and balance exercise program, and provision of appropriate assistive devices; refer to rehabilitation therapy.  Refer to community-based fall prevention program where available.  If  fall occurs, determine the cause and revise fall injury prevention plan.  Regularly review medication contribution to fall risk; consider risk related to polypharmacy and age.  Refer to pharmacist for consultation when concerns about medications are revealed.  Balance adequate  pain management with potential for oversedation.  Provide guidance related to environmental modifications.  Consider supplementation with Vitamin D.   Notes:      DIET - INCREASE WATER INTAKE       COMPLETED: Exercise 3x per week (30 min per time)       General - Client will not be readmitted within 30 days ([facility])         Depression Screen    11/02/2021    2:32 PM 05/13/2020    9:56 AM 10/31/2016    8:53 AM 05/02/2016    9:11 AM 10/26/2015   11:23 AM 04/07/2014    8:53 AM 04/03/2013   10:08 AM  PHQ 2/9 Scores  PHQ - 2 Score 0 0 0 0 0 0 0  PHQ- 9 Score  0         Fall Risk    11/02/2021    2:31 PM 05/13/2020    9:55 AM 10/31/2016    8:53 AM 05/02/2016    9:11 AM 01/04/2016    8:57 AM  Fall Risk   Falls in the past year? 1 1 Yes No No  Number falls in past yr: 1 0 1    Injury with Fall? 1 0 Yes    Risk Factor Category    High Fall Risk    Risk for fall due to : History of fall(s);Impaired balance/gait;Impaired mobility Impaired balance/gait;Impaired mobility;History of fall(s)     Follow up  Falls evaluation completed;Falls prevention discussed Education provided;Falls prevention discussed      FALL RISK PREVENTION PERTAINING TO THE HOME:  Any stairs in or around the home? Yes  If so, are there any without handrails? No  Home free of loose throw rugs in walkways, pet beds, electrical cords, etc? No  Adequate lighting in your home to reduce risk of falls? Yes   ASSISTIVE DEVICES UTILIZED TO PREVENT FALLS:  Life alert? no Use of a cane, walker or w/c? Yes  Grab bars in the bathroom? Yes  Shower chair or bench in shower? Yes  Elevated toilet seat or a handicapped toilet? Yes   TIMED UP AND  GO:  Was the test performed? n/a nonambulatory    Cognitive Function:    11/02/2021    2:34 PM  MMSE - Mini Mental State Exam  Not completed: Unable to complete        05/13/2020    9:57 AM  6CIT Screen  What Year? 0 points  What month? 0 points  What time? 0 points  Count back from 20 0 points  Months in reverse 4 points  Repeat phrase 2 points  Total Score 6 points    Immunizations Immunization History  Administered Date(s) Administered   Fluad Quad(high Dose 65+) 03/05/2019, 03/23/2021   Influenza Split 03/02/2011, 03/14/2012   Influenza Whole 03/21/2007, 02/24/2008, 02/18/2009, 01/28/2010   Influenza, High Dose Seasonal PF 04/03/2013, 03/10/2014, 03/30/2015, 02/29/2016, 02/06/2017, 02/27/2018   PFIZER(Purple Top)SARS-COV-2 Vaccination 07/04/2019, 07/27/2019   Pfizer Covid-19 Vaccine Bivalent Booster 25yr & up 10/11/2021   Pneumococcal Conjugate-13 04/07/2014   Pneumococcal Polysaccharide-23 10/18/2010   Tdap 10/18/2010   Zoster Recombinat (Shingrix) 09/16/2021    TDAP status: Due, Education has been provided regarding the importance of this vaccine. Advised may receive this vaccine at local pharmacy or Health Dept. Aware to provide a copy of the vaccination record if obtained from local pharmacy or Health Dept. Verbalized acceptance and understanding.  Flu Vaccine status: Up to date  Pneumococcal vaccine status:  Up to date  Covid up to date   Qualifies for Shingles Vaccine? No   Zostavax completed No   Shingrix Completed?: No.    Education has been provided regarding the importance of this vaccine. Patient has been advised to call insurance company to determine out of pocket expense if they have not yet received this vaccine. Advised may also receive vaccine at local pharmacy or Health Dept. Verbalized acceptance and understanding.  Screening Tests Health Maintenance  Topic Date Due   TETANUS/TDAP  03/28/2022 (Originally 10/17/2020)   Zoster Vaccines-  Shingrix (2 of 2) 11/11/2021   COVID-19 Vaccine (4 - Booster for Pfizer series) 12/06/2021   INFLUENZA VACCINE  12/20/2021   Pneumonia Vaccine 73+ Years old  Completed   DEXA SCAN  Completed   HPV VACCINES  Aged Out   MAMMOGRAM  Discontinued    Health Maintenance  There are no preventive care reminders to display for this patient.   Colorectal cancer screening: No longer required.   Mammogram status: No longer required due to advanced age .  Bone density: no longer required due to advanced age   Lung Cancer Screening: (Low Dose CT Chest recommended if Age 73-80 years, 30 pack-year currently smoking OR have quit w/in 15years.) does not qualify.   Lung Cancer Screening Referral: n/a  Additional Screening:  Hepatitis C Screening: does not qualify; due to advanced age  Vision Screening: Recommended annual ophthalmology exams for early detection of glaucoma and other disorders of the eye. Is the patient up to date with their annual eye exam?  No  Who is the provider or what is the name of the office in which the patient attends annual eye exams? N/a  If pt is not established with a provider, would they like to be referred to a provider to establish care? No .   Dental Screening: Recommended annual dental exams for proper oral hygiene  Community Resource Referral / Chronic Care Management: CRR required this visit?  No   CCM required this visit?  No      Plan:     I have personally reviewed and noted the following in the patient's chart:   Medical and social history Use of alcohol, tobacco or illicit drugs  Current medications and supplements including opioid prescriptions.  Functional ability and status Nutritional status Physical activity Advanced directives List of other physicians Hospitalizations, surgeries, and ER visits in previous 12 months Vitals Screenings to include cognitive, depression, and falls Referrals and appointments  In addition, I have reviewed  and discussed with patient certain preventive protocols, quality metrics, and best practice recommendations. A written personalized care plan for preventive services as well as general preventive health recommendations were provided to patient.     Gerlene Fee, NP   11/02/2021   Nurse Notes: this test has been performed at [facility]; where she is a long term resident.

## 2021-11-24 ENCOUNTER — Non-Acute Institutional Stay (SKILLED_NURSING_FACILITY): Payer: Medicare Other | Admitting: Adult Health

## 2021-11-24 ENCOUNTER — Encounter: Payer: Self-pay | Admitting: Adult Health

## 2021-11-24 DIAGNOSIS — D649 Anemia, unspecified: Secondary | ICD-10-CM

## 2021-11-24 DIAGNOSIS — N1832 Chronic kidney disease, stage 3b: Secondary | ICD-10-CM

## 2021-11-24 DIAGNOSIS — E782 Mixed hyperlipidemia: Secondary | ICD-10-CM

## 2021-11-24 NOTE — Progress Notes (Signed)
Location:  [place] Room Number: 155-P Place of Service:  SNF (31)   CODE STATUS: DNR  Allergies  Allergen Reactions   Septra Ds [Sulfamethoxazole-Trimethoprim]    Lisinopril Other (See Comments)    Cough    Chief Complaint  Patient presents with   Medical Management of Chronic Issues                                       Chronic kidney disease stage 3b: Acute post operative anemia due to expected blood loss: Mixed hyperlipidemia:    HPI:  She is a 88 year old long term resident of this facility being seen for the management of her chronic illnesses:  Chronic kidney disease stage 3b: Acute post operative anemia due to expected blood loss: Mixed hyperlipidemia. There are no reports of uncontrolled pain. Her weight is stable. She does get out of bed daily.   Past Medical History:  Diagnosis Date   Acute metabolic encephalopathy 81/0175   Atrial fibrillation ([place])    Bleeding stomach ulcer 1980s?   CVA (cerebral vascular accident) ([place]) 07/2021   DIVERTICULOSIS, COLON 11/22/2006   Headache(784.0)    "very often; not regular" (11/25/2015)   Heart block    hx of   Hip fracture ([place]) 07/2021   History of blood transfusion 1980s   "when I had the bleeding ulcer"   HYPERLIPIDEMIA 11/22/2006   Hypertension    Melanoma ([place])    LLE   Menopausal syndrome (hot flashes)    OSTEOPOROSIS 11/22/2006   Presence of permanent cardiac pacemaker    Vitamin D deficiency 08/21/2021    Past Surgical History:  Procedure Laterality Date   APPENDECTOMY     BUNIONECTOMY Right    CATARACT EXTRACTION W/ INTRAOCULAR LENS  IMPLANT, BILATERAL Bilateral 2000s   CHOLECYSTECTOMY OPEN     EP IMPLANTABLE DEVICE N/A 11/25/2015   Procedure: Pacemaker Implant;  Surgeon: Will Meredith Leeds, MD;  Location: [place] [facility];  Service: Cardiovascular;  Laterality: N/A;   FEMUR IM NAIL Left 07/08/2014   Procedure: INTRAMEDULLARY (IM) NAIL ;  Surgeon: Meredith Pel, MD;   Location: [facility];  Service: Orthopedics;  Laterality: Left;   FRACTURE SURGERY     HIP ARTHROPLASTY Right 08/20/2021   Procedure: ARTHROPLASTY BIPOLAR HIP (HEMIARTHROPLASTY);  Surgeon: Altamese Dublin, MD;  Location: [place];  Service: Orthopedics;  Laterality: Right;   IR KYPHO LUMBAR INC FX REDUCE BONE BX UNI/BIL CANNULATION INC/IMAGING  03/21/2021   PACEMAKER INSERTION     REVISION TOTAL HIP ARTHROPLASTY Left 06/2014    Social History   Socioeconomic History   Marital status: Widowed    Spouse name: Not on file   Number of children: 2   Years of education: 69   Highest education level: Not on file  Occupational History    Comment: retired  Tobacco Use   Smoking status: Never    Passive exposure: Never   Smokeless tobacco: Never  Vaping Use   Vaping Use: Never used  Substance and Sexual Activity   Alcohol use: No    Alcohol/week: 0.0 standard drinks of alcohol   Drug use: No   Sexual activity: Never  Other Topics Concern   Not on file  Social History Narrative   09/21/21 resides at [facility] alone, son lives beside her   Caffeine - none  Social Determinants of Health   Financial Resource Strain: Low Risk  (05/13/2020)   Overall Financial Resource Strain (CARDIA)    Difficulty of Paying Living Expenses: Not hard at all  Food Insecurity: No Food Insecurity (05/13/2020)   Hunger Vital Sign    Worried About Running Out of Food in the Last Year: Never true    Ran Out of Food in the Last Year: Never true  Transportation Needs: No Transportation Needs (05/13/2020)   PRAPARE - Hydrologist (Medical): No    Lack of Transportation (Non-Medical): No  Physical Activity: Inactive (05/13/2020)   Exercise Vital Sign    Days of Exercise per Week: 0 days    Minutes of Exercise per Session: 0 min  Stress: No Stress Concern Present (05/13/2020)   [place]    Feeling of Stress  : Not at all  Social Connections: Socially Isolated (05/13/2020)   Social Connection and Isolation Panel [NHANES]    Frequency of Communication with Friends and Family: More than three times a week    Frequency of Social Gatherings with Friends and Family: More than three times a week    Attends Religious Services: Never    Marine scientist or Organizations: No    Attends Archivist Meetings: Never    Marital Status: Widowed  Intimate Partner Violence: Not At Risk (05/13/2020)   Humiliation, Afraid, Rape, and Kick questionnaire    Fear of Current or Ex-Partner: No    Emotionally Abused: No    Physically Abused: No    Sexually Abused: No   Family History  Problem Relation Age of Onset   Alzheimer's disease Sister    Cardiomyopathy Brother       VITAL SIGNS BP 138/80   Pulse 91   Temp 97.7 F (36.5 C)   Resp 20   Ht '5\' 5"'$  (1.651 m)   Wt 111 lb 3.2 oz (50.4 kg)   SpO2 100%   BMI 18.50 kg/m   Outpatient Encounter Medications as of 11/24/2021  Medication Sig   Acetaminophen (TYLENOL ARTHRITIS PAIN PO) Take 650 mg by mouth every 6 (six) hours.   amiodarone (PACERONE) 200 MG tablet TAKE 1 TABLET(200 MG) BY MOUTH DAILY   aspirin 81 MG chewable tablet Chew 1 tablet (81 mg total) by mouth daily.   Balsam [place]-Castor Oil [facility]) OINT Special Instructions: apply to DTI Right & Left medial buttock Every Shift; Day   lidocaine 4 % Place 1 patch onto the skin. place on in the AM and remove at [facility] for right flank pain   methocarbamol (ROBAXIN) 500 MG tablet Take 500 mg by mouth every 8 (eight) hours as needed (back pain).   nitrofurantoin, macrocrystal-monohydrate, (MACROBID) 100 MG capsule Take 100 mg by mouth at bedtime. UTI prophylaxis   NON FORMULARY Diet - Dysphagia 3   ondansetron (ZOFRAN) 4 MG tablet Take 1 tablet (4 mg total) by mouth daily as needed for nausea or vomiting.   senna (SENOKOT) 8.6 MG TABS tablet Take 1 tablet by mouth in the morning and at bedtime.    pantoprazole (PROTONIX) 40 MG tablet Take 1 tablet (40 mg total) by mouth daily.   No facility-administered encounter medications on file as of 11/24/2021.     SIGNIFICANT DIAGNOSTIC EXAMS   PREVIOUS   08-17-21: ct head stroke:  1. No acute CT finding. Atrophy and chronic small-vessel ischemic changes. 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 2.  ASPECTS is 10, allowing for the old infarction  08-17-21: ct angio of head:  1. Decreased perfusion (Tmax> 6 seconds) in the posterior left MCA territory, which correlates with an area with decreased contrast enhancement in the MCA branches. While there is mild to moderate narrowing in the distal left M1 segment, no additional focal stenosis or occlusion is seen in the left MCA branches. No infarct core. 2. Multifocal narrowing in the bilateral PCAs. No other hemodynamically significant intracranial stenosis. 3. Approximately 50% proximal left ICA stenosis, unchanged. No additional hemodynamically significant stenosis in the neck.  08-17-21: right hip x-ray: Acute displaced right femoral neck fracture.   08-18-21: MRI of brain 1. Moderately motion limited study with evidence of acute left frontal and parietal infarcts. The left parietal infarct is along the posterior aspect of a remote left parietal infarct. 2. Moderate chronic microvascular ischemic disease. 3.  Cerebral atrophy   NO NEW EXAMS.   LABS REVIEWED; PREVIOUS   08-17-21: wbc 19.6 hgb 14.3; hct 45.2 mcv 94.8 plt 209; glucose 144; bun 15; creat 1.22 k+ 3.8; na++ 137; ca 9.1; GFR 42; protein 6.7; albumin 3.8 hgb a1c 5.7 08-18-21: urine culture <10,000. Vitamin B 12: 232; tsh 1.864 08-21-21: vit D 12.78 08-23-21: vitamin B 12: 204; iron 22; tibc 185; ferritin 234;  08-24-21: wbc 9.2; hgb 9.1; hct 27.0; mcv 90.0 plt 154; glucose 104; bun 17; creat 0.76; k + 3.8; na++ 136; ca 8.1; GFR >60  NO NEW LABS.    Review of Systems  Constitutional:  Negative for malaise/fatigue.  Respiratory:   Negative for cough and shortness of breath.   Cardiovascular:  Negative for chest pain, palpitations and leg swelling.  Gastrointestinal:  Negative for abdominal pain, constipation and heartburn.  Musculoskeletal:  Negative for back pain, joint pain and myalgias.  Skin: Negative.   Neurological:  Negative for dizziness.  Psychiatric/Behavioral:  The patient is not nervous/anxious.     Physical Exam Constitutional:      General: She is not in acute distress.    Appearance: She is well-developed. She is not diaphoretic.  Neck:     Thyroid: No thyromegaly.  Cardiovascular:     Rate and Rhythm: Normal rate and regular rhythm.     Pulses: Normal pulses.     Heart sounds: Murmur heard.     Comments: Pace maker  Pulmonary:     Effort: Pulmonary effort is normal. No respiratory distress.     Breath sounds: Normal breath sounds.  Abdominal:     General: Bowel sounds are normal. There is no distension.     Palpations: Abdomen is soft.     Tenderness: There is no abdominal tenderness.  Musculoskeletal:     Cervical back: Neck supple.     Right lower leg: No edema.     Left lower leg: No edema.     Comments: Right hip hemiarthroplasty Is able to move all extremities    Lymphadenopathy:     Cervical: No cervical adenopathy.  Skin:    General: Skin is warm and dry.  Neurological:     Mental Status: She is alert. Mental status is at baseline.  Psychiatric:        Mood and Affect: Mood normal.     ASSESSMENT/ PLAN:  TODAY  Chronic kidney disease stage 3b: bun 17; creat 0.76; gfr >60  2. Acute post operative anemia due to expected blood loss: hgb 9.1 will monitor   3. Mixed hyperlipidemia: is off lipitor  PREVIOUS   4. Vitamin D deficiency level 12.78 will continue 2000 units twice daily   5. Vitamin B 12: level 204 will begin 1,000 mcg daily   6. Aortic atherosclerosis: (ct 08-02-21) will continue asa off statin due to advanced age.   7. History of CVA(cerebral vascular  accient) is neurologically stable  will continue asa 81 mg daily will monitor  8. Atrial fibrillation / sick sinus syndrome: heart rate is stable; will continue amiodarone 200 mg daily lopressor 37.5 mg twice daily for rate control; will continue xarelto 15 mg daily   9. Primary hypertension: b/p 138/80: will continue lopressor 37.5 mg twice daily   10. Gastroesophageal reflux disease without esophagitis: will continue protonix 40 mg daily   11.  Closed displaced fracture of right femoral neck: is stable will continue to monitor her status.      Ok Edwards NP [facility]  call [phone]

## 2021-11-25 DIAGNOSIS — N1832 Chronic kidney disease, stage 3b: Secondary | ICD-10-CM | POA: Insufficient documentation

## 2021-12-07 DIAGNOSIS — I639 Cerebral infarction, unspecified: Secondary | ICD-10-CM | POA: Diagnosis not present

## 2021-12-07 DIAGNOSIS — M6281 Muscle weakness (generalized): Secondary | ICD-10-CM | POA: Diagnosis not present

## 2021-12-07 DIAGNOSIS — M5459 Other low back pain: Secondary | ICD-10-CM | POA: Diagnosis not present

## 2021-12-07 DIAGNOSIS — Z9181 History of falling: Secondary | ICD-10-CM | POA: Diagnosis not present

## 2021-12-07 DIAGNOSIS — R293 Abnormal posture: Secondary | ICD-10-CM | POA: Diagnosis not present

## 2021-12-08 DIAGNOSIS — Z9181 History of falling: Secondary | ICD-10-CM | POA: Diagnosis not present

## 2021-12-08 DIAGNOSIS — M5459 Other low back pain: Secondary | ICD-10-CM | POA: Diagnosis not present

## 2021-12-08 DIAGNOSIS — M6281 Muscle weakness (generalized): Secondary | ICD-10-CM | POA: Diagnosis not present

## 2021-12-08 DIAGNOSIS — I639 Cerebral infarction, unspecified: Secondary | ICD-10-CM | POA: Diagnosis not present

## 2021-12-08 DIAGNOSIS — R293 Abnormal posture: Secondary | ICD-10-CM | POA: Diagnosis not present

## 2021-12-09 DIAGNOSIS — M6281 Muscle weakness (generalized): Secondary | ICD-10-CM | POA: Diagnosis not present

## 2021-12-09 DIAGNOSIS — Z9181 History of falling: Secondary | ICD-10-CM | POA: Diagnosis not present

## 2021-12-09 DIAGNOSIS — M5459 Other low back pain: Secondary | ICD-10-CM | POA: Diagnosis not present

## 2021-12-09 DIAGNOSIS — R293 Abnormal posture: Secondary | ICD-10-CM | POA: Diagnosis not present

## 2021-12-09 DIAGNOSIS — I639 Cerebral infarction, unspecified: Secondary | ICD-10-CM | POA: Diagnosis not present

## 2021-12-12 DIAGNOSIS — R293 Abnormal posture: Secondary | ICD-10-CM | POA: Diagnosis not present

## 2021-12-12 DIAGNOSIS — I639 Cerebral infarction, unspecified: Secondary | ICD-10-CM | POA: Diagnosis not present

## 2021-12-12 DIAGNOSIS — M6281 Muscle weakness (generalized): Secondary | ICD-10-CM | POA: Diagnosis not present

## 2021-12-12 DIAGNOSIS — Z9181 History of falling: Secondary | ICD-10-CM | POA: Diagnosis not present

## 2021-12-12 DIAGNOSIS — M5459 Other low back pain: Secondary | ICD-10-CM | POA: Diagnosis not present

## 2021-12-13 DIAGNOSIS — M6281 Muscle weakness (generalized): Secondary | ICD-10-CM | POA: Diagnosis not present

## 2021-12-13 DIAGNOSIS — Z9181 History of falling: Secondary | ICD-10-CM | POA: Diagnosis not present

## 2021-12-13 DIAGNOSIS — R293 Abnormal posture: Secondary | ICD-10-CM | POA: Diagnosis not present

## 2021-12-13 DIAGNOSIS — M5459 Other low back pain: Secondary | ICD-10-CM | POA: Diagnosis not present

## 2021-12-13 DIAGNOSIS — I639 Cerebral infarction, unspecified: Secondary | ICD-10-CM | POA: Diagnosis not present

## 2021-12-14 DIAGNOSIS — R293 Abnormal posture: Secondary | ICD-10-CM | POA: Diagnosis not present

## 2021-12-14 DIAGNOSIS — M6281 Muscle weakness (generalized): Secondary | ICD-10-CM | POA: Diagnosis not present

## 2021-12-14 DIAGNOSIS — M5459 Other low back pain: Secondary | ICD-10-CM | POA: Diagnosis not present

## 2021-12-14 DIAGNOSIS — I639 Cerebral infarction, unspecified: Secondary | ICD-10-CM | POA: Diagnosis not present

## 2021-12-14 DIAGNOSIS — Z9181 History of falling: Secondary | ICD-10-CM | POA: Diagnosis not present

## 2021-12-15 DIAGNOSIS — M6281 Muscle weakness (generalized): Secondary | ICD-10-CM | POA: Diagnosis not present

## 2021-12-15 DIAGNOSIS — M5459 Other low back pain: Secondary | ICD-10-CM | POA: Diagnosis not present

## 2021-12-15 DIAGNOSIS — R293 Abnormal posture: Secondary | ICD-10-CM | POA: Diagnosis not present

## 2021-12-15 DIAGNOSIS — Z9181 History of falling: Secondary | ICD-10-CM | POA: Diagnosis not present

## 2021-12-15 DIAGNOSIS — I639 Cerebral infarction, unspecified: Secondary | ICD-10-CM | POA: Diagnosis not present

## 2021-12-16 ENCOUNTER — Non-Acute Institutional Stay (SKILLED_NURSING_FACILITY): Payer: Medicare Other | Admitting: Adult Health

## 2021-12-16 ENCOUNTER — Encounter: Payer: Self-pay | Admitting: Adult Health

## 2021-12-16 DIAGNOSIS — R293 Abnormal posture: Secondary | ICD-10-CM | POA: Diagnosis not present

## 2021-12-16 DIAGNOSIS — M5459 Other low back pain: Secondary | ICD-10-CM | POA: Diagnosis not present

## 2021-12-16 DIAGNOSIS — I639 Cerebral infarction, unspecified: Secondary | ICD-10-CM | POA: Diagnosis not present

## 2021-12-16 DIAGNOSIS — S72001A Fracture of unspecified part of neck of right femur, initial encounter for closed fracture: Secondary | ICD-10-CM

## 2021-12-16 DIAGNOSIS — M6281 Muscle weakness (generalized): Secondary | ICD-10-CM | POA: Diagnosis not present

## 2021-12-16 DIAGNOSIS — I495 Sick sinus syndrome: Secondary | ICD-10-CM

## 2021-12-16 DIAGNOSIS — I482 Chronic atrial fibrillation, unspecified: Secondary | ICD-10-CM

## 2021-12-16 DIAGNOSIS — N1832 Chronic kidney disease, stage 3b: Secondary | ICD-10-CM | POA: Diagnosis not present

## 2021-12-16 DIAGNOSIS — Z9181 History of falling: Secondary | ICD-10-CM | POA: Diagnosis not present

## 2021-12-16 NOTE — Progress Notes (Signed)
Location:  [place] Room Number: 155 Place of Service:  SNF (31)   CODE STATUS: dnr   Allergies  Allergen Reactions   Septra Ds [Sulfamethoxazole-Trimethoprim]    Lisinopril Other (See Comments)    Cough    Chief Complaint  Patient presents with   Acute Visit    Care plan meeting.     HPI:  We have come together for her care plan meeting. Family present patient present. BIMS 11/15  mood 0/30. She is nonambulatory with one fall without injury. She requires limited to extensive assist with adls. She is frequently incontinent of bladder and bowel. Dietary:  D3 appetite: 50-100%. Does have supplements Feeds self; weight is 111 pounds.  Therapy: for wheelchair positioning . She does participates in activities. Likes baseball and game shows. She continues to be followed for her chronic illnesses including:  Atrial fibrillation chronic  Sick sinus syndrome Closed displaced fracture of right femoral head  Chronic kidney disease stage 3b . Does  have chronic back pain. Is on lidoderm patch and scheduled tylenol for her pain.   Past Medical History:  Diagnosis Date   Acute metabolic encephalopathy 99/3716   Atrial fibrillation ([place])    Bleeding stomach ulcer 1980s?   CVA (cerebral vascular accident) ([place]) 07/2021   DIVERTICULOSIS, COLON 11/22/2006   Headache(784.0)    "very often; not regular" (11/25/2015)   Heart block    hx of   Hip fracture ([place]) 07/2021   History of blood transfusion 1980s   "when I had the bleeding ulcer"   HYPERLIPIDEMIA 11/22/2006   Hypertension    Melanoma ([place])    LLE   Menopausal syndrome (hot flashes)    OSTEOPOROSIS 11/22/2006   Presence of permanent cardiac pacemaker    Vitamin D deficiency 08/21/2021    Past Surgical History:  Procedure Laterality Date   APPENDECTOMY     BUNIONECTOMY Right    CATARACT EXTRACTION W/ INTRAOCULAR LENS  IMPLANT, BILATERAL Bilateral 2000s   CHOLECYSTECTOMY OPEN     EP IMPLANTABLE DEVICE  N/A 11/25/2015   Procedure: Pacemaker Implant;  Surgeon: Will Meredith Leeds, MD;  Location: [place] [facility];  Service: Cardiovascular;  Laterality: N/A;   FEMUR IM NAIL Left 07/08/2014   Procedure: INTRAMEDULLARY (IM) NAIL ;  Surgeon: Meredith Pel, MD;  [facility]: [facility];  Service: Orthopedics;  Laterality: Left;   FRACTURE SURGERY     HIP ARTHROPLASTY Right 08/20/2021   Procedure: ARTHROPLASTY BIPOLAR HIP (HEMIARTHROPLASTY);  Surgeon: Altamese Herbster, MD;  Location: [place];  Service: Orthopedics;  Laterality: Right;   IR KYPHO LUMBAR INC FX REDUCE BONE BX UNI/BIL CANNULATION INC/IMAGING  03/21/2021   PACEMAKER INSERTION     REVISION TOTAL HIP ARTHROPLASTY Left 06/2014    Social History   Socioeconomic History   Marital status: Widowed    Spouse name: Not on file   Number of children: 2   Years of education: 18   Highest education level: Not on file  Occupational History    Comment: retired  Tobacco Use   Smoking status: Never    Passive exposure: Never   Smokeless tobacco: Never  Vaping Use   Vaping Use: Never used  Substance and Sexual Activity   Alcohol use: No    Alcohol/week: 0.0 standard drinks of alcohol   Drug use: No   Sexual activity: Never  Other Topics Concern   Not on file  Social History Narrative   09/21/21 resides at [facility]  Lives alone, son lives beside her   Caffeine - none   Social Determinants of Health   Financial Resource Strain: Low Risk  (05/13/2020)   Overall Financial Resource Strain (CARDIA)    Difficulty of Paying Living Expenses: Not hard at all  Food Insecurity: No Food Insecurity (05/13/2020)   Hunger Vital Sign    Worried About Running Out of Food in the Last Year: Never true    Ran Out of Food in the Last Year: Never true  Transportation Needs: No Transportation Needs (05/13/2020)   PRAPARE - Hydrologist (Medical): No    Lack of Transportation (Non-Medical): No  Physical Activity: Inactive  (05/13/2020)   Exercise Vital Sign    Days of Exercise per Week: 0 days    Minutes of Exercise per Session: 0 min  Stress: No Stress Concern Present (05/13/2020)   [place]    Feeling of Stress : Not at all  Social Connections: Socially Isolated (05/13/2020)   Social Connection and Isolation Panel [NHANES]    Frequency of Communication with Friends and Family: More than three times a week    Frequency of Social Gatherings with Friends and Family: More than three times a week    Attends Religious Services: Never    Marine scientist or Organizations: No    Attends Archivist Meetings: Never    Marital Status: Widowed  Intimate Partner Violence: Not At Risk (05/13/2020)   Humiliation, Afraid, Rape, and Kick questionnaire    Fear of Current or Ex-Partner: No    Emotionally Abused: No    Physically Abused: No    Sexually Abused: No   Family History  Problem Relation Age of Onset   Alzheimer's disease Sister    Cardiomyopathy Brother       VITAL SIGNS BP 121/61   Pulse 68   Temp 97.9 F (36.6 C)   Resp 18   Ht '5\' 5"'$  (1.651 m)   Wt 111 lb 3.2 oz (50.4 kg)   BMI 18.50 kg/m   Outpatient Encounter Medications as of 12/16/2021  Medication Sig   Acetaminophen (TYLENOL ARTHRITIS PAIN PO) Take 650 mg by mouth every 6 (six) hours.   amiodarone (PACERONE) 200 MG tablet TAKE 1 TABLET(200 MG) BY MOUTH DAILY   aspirin 81 MG chewable tablet Chew 1 tablet (81 mg total) by mouth daily.   Balsam [place]-Castor Oil [facility]) OINT Special Instructions: apply to DTI Right & Left medial buttock Every Shift; Day   lidocaine 4 % Place 1 patch onto the skin. place on in the AM and remove at [facility] for right flank pain   methocarbamol (ROBAXIN) 500 MG tablet Take 500 mg by mouth every 8 (eight) hours as needed (back pain).   nitrofurantoin, macrocrystal-monohydrate, (MACROBID) 100 MG capsule Take 100 mg by mouth at bedtime.  UTI prophylaxis   NON FORMULARY Diet - Dysphagia 3   ondansetron (ZOFRAN) 4 MG tablet Take 1 tablet (4 mg total) by mouth daily as needed for nausea or vomiting.   pantoprazole (PROTONIX) 40 MG tablet Take 1 tablet (40 mg total) by mouth daily.   senna (SENOKOT) 8.6 MG TABS tablet Take 1 tablet by mouth in the morning and at bedtime.   No facility-administered encounter medications on file as of 12/16/2021.     SIGNIFICANT DIAGNOSTIC EXAMS  PREVIOUS   08-17-21: ct head stroke:  1. No acute CT finding. Atrophy and chronic small-vessel  ischemic changes. 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 2. ASPECTS is 10, allowing for the old infarction  08-17-21: ct angio of head:  1. Decreased perfusion (Tmax> 6 seconds) in the posterior left MCA territory, which correlates with an area with decreased contrast enhancement in the MCA branches. While there is mild to moderate narrowing in the distal left M1 segment, no additional focal stenosis or occlusion is seen in the left MCA branches. No infarct core. 2. Multifocal narrowing in the bilateral PCAs. No other hemodynamically significant intracranial stenosis. 3. Approximately 50% proximal left ICA stenosis, unchanged. No additional hemodynamically significant stenosis in the neck.  08-17-21: right hip x-ray: Acute displaced right femoral neck fracture.   08-18-21: MRI of brain 1. Moderately motion limited study with evidence of acute left frontal and parietal infarcts. The left parietal infarct is along the posterior aspect of a remote left parietal infarct. 2. Moderate chronic microvascular ischemic disease. 3.  Cerebral atrophy   NO NEW EXAMS.   LABS REVIEWED; PREVIOUS   08-17-21: wbc 19.6 hgb 14.3; hct 45.2 mcv 94.8 plt 209; glucose 144; bun 15; creat 1.22 k+ 3.8; na++ 137; ca 9.1; GFR 42; protein 6.7; albumin 3.8 hgb a1c 5.7 08-18-21: urine culture <10,000. Vitamin B 12: 232; tsh 1.864 08-21-21: vit D 12.78 08-23-21: vitamin B 12: 204; iron 22;  tibc 185; ferritin 234;  08-24-21: wbc 9.2; hgb 9.1; hct 27.0; mcv 90.0 plt 154; glucose 104; bun 17; creat 0.76; k + 3.8; na++ 136; ca 8.1; GFR >60  NO NEW LABS.   Review of Systems  Constitutional:  Negative for malaise/fatigue.  Respiratory:  Negative for cough and shortness of breath.   Cardiovascular:  Negative for chest pain, palpitations and leg swelling.  Gastrointestinal:  Negative for abdominal pain, constipation and heartburn.  Musculoskeletal:  Positive for back pain. Negative for joint pain and myalgias.  Skin: Negative.   Neurological:  Negative for dizziness.  Psychiatric/Behavioral:  The patient is not nervous/anxious.     Physical Exam Constitutional:      General: She is not in acute distress.    Appearance: She is well-developed. She is not diaphoretic.  Neck:     Thyroid: No thyromegaly.  Cardiovascular:     Rate and Rhythm: Normal rate and regular rhythm.     Pulses: Normal pulses.     Heart sounds: Murmur heard.     Comments: Pace maker  Pulmonary:     Effort: Pulmonary effort is normal. No respiratory distress.     Breath sounds: Normal breath sounds.  Abdominal:     General: Bowel sounds are normal. There is no distension.     Palpations: Abdomen is soft.     Tenderness: There is no abdominal tenderness.  Musculoskeletal:     Cervical back: Neck supple.     Right lower leg: No edema.     Left lower leg: No edema.     Comments: Right hip hemiarthroplasty Is able to move all extremities    Lymphadenopathy:     Cervical: No cervical adenopathy.  Skin:    General: Skin is warm and dry.  Neurological:     Mental Status: She is alert. Mental status is at baseline.  Psychiatric:        Mood and Affect: Mood normal.       ASSESSMENT/ PLAN:  TODAY  Atrial fibrillation chronic Sick sinus syndrome Closed displaced fracture of right femoral head Chronic kidney disease stage 3b  Will begin gabapentin 100 mg nightly for  her sharp back pain.  Will  continue current plan of care Will continue to monitor her status.   Time spent with patient: 40 minutes: medications; plan of care dietary.    Ok Edwards NP [facility]  call [phone]

## 2021-12-19 DIAGNOSIS — I639 Cerebral infarction, unspecified: Secondary | ICD-10-CM | POA: Diagnosis not present

## 2021-12-19 DIAGNOSIS — M5459 Other low back pain: Secondary | ICD-10-CM | POA: Diagnosis not present

## 2021-12-19 DIAGNOSIS — M6281 Muscle weakness (generalized): Secondary | ICD-10-CM | POA: Diagnosis not present

## 2021-12-19 DIAGNOSIS — R293 Abnormal posture: Secondary | ICD-10-CM | POA: Diagnosis not present

## 2021-12-19 DIAGNOSIS — Z9181 History of falling: Secondary | ICD-10-CM | POA: Diagnosis not present

## 2021-12-29 ENCOUNTER — Non-Acute Institutional Stay (SKILLED_NURSING_FACILITY): Payer: Medicare Other | Admitting: Adult Health

## 2021-12-29 DIAGNOSIS — I700 Atherosclerosis of aorta: Secondary | ICD-10-CM | POA: Diagnosis not present

## 2021-12-29 DIAGNOSIS — E538 Deficiency of other specified B group vitamins: Secondary | ICD-10-CM | POA: Diagnosis not present

## 2021-12-29 DIAGNOSIS — E559 Vitamin D deficiency, unspecified: Secondary | ICD-10-CM | POA: Diagnosis not present

## 2021-12-30 ENCOUNTER — Encounter: Payer: Self-pay | Admitting: Adult Health

## 2021-12-30 DIAGNOSIS — I700 Atherosclerosis of aorta: Secondary | ICD-10-CM | POA: Insufficient documentation

## 2021-12-30 NOTE — Progress Notes (Signed)
Location:  [place] Room Number: 8 Place of Service:  SNF (31)   CODE STATUS: dnr   Allergies  Allergen Reactions   Septra Ds [Sulfamethoxazole-Trimethoprim]    Lisinopril Other (See Comments)    Cough    Chief Complaint  Patient presents with   Medical Management of Chronic Issues                   Vitamin D deficiency:  Vitamin B 12 deficiency:  Aortic atherosclerosis     HPI:  She is a 88 year old long term resident of this facility being seen for the management of her chronic illnesses: Vitamin D deficiency:  Vitamin B 12 deficiency:  Aortic atherosclerosis. There are no reports of uncontrolled there are no reports of changes in appetite. There are no reports of anxiety or depressive thoughts.   Past Medical History:  Diagnosis Date   Acute metabolic encephalopathy 46/2703   Atrial fibrillation ([place])    Bleeding stomach ulcer 1980s?   CVA (cerebral vascular accident) ([place]) 07/2021   DIVERTICULOSIS, COLON 11/22/2006   Headache(784.0)    "very often; not regular" (11/25/2015)   Heart block    hx of   Hip fracture ([place]) 07/2021   History of blood transfusion 1980s   "when I had the bleeding ulcer"   HYPERLIPIDEMIA 11/22/2006   Hypertension    Melanoma ([place])    LLE   Menopausal syndrome (hot flashes)    OSTEOPOROSIS 11/22/2006   Presence of permanent cardiac pacemaker    Vitamin D deficiency 08/21/2021    Past Surgical History:  Procedure Laterality Date   APPENDECTOMY     BUNIONECTOMY Right    CATARACT EXTRACTION W/ INTRAOCULAR LENS  IMPLANT, BILATERAL Bilateral 2000s   CHOLECYSTECTOMY OPEN     EP IMPLANTABLE DEVICE N/A 11/25/2015   Procedure: Pacemaker Implant;  Surgeon: Will Meredith Leeds, MD;  Location: [facility];  Service: Cardiovascular;  Laterality: N/A;   FEMUR IM NAIL Left 07/08/2014   Procedure: INTRAMEDULLARY (IM) NAIL ;  Surgeon: Meredith Pel, MD;  [facility]: [facility];  Service: Orthopedics;  Laterality:  Left;   FRACTURE SURGERY     HIP ARTHROPLASTY Right 08/20/2021   Procedure: ARTHROPLASTY BIPOLAR HIP (HEMIARTHROPLASTY);  Surgeon: Altamese Tecolotito, MD;  Location: [facility];  Service: Orthopedics;  Laterality: Right;   IR KYPHO LUMBAR INC FX REDUCE BONE BX UNI/BIL CANNULATION INC/IMAGING  03/21/2021   PACEMAKER INSERTION     REVISION TOTAL HIP ARTHROPLASTY Left 06/2014    Social History   Socioeconomic History   Marital status: Widowed    Spouse name: Not on file   Number of children: 2   Years of education: 97   Highest education level: Not on file  Occupational History    Comment: retired  Tobacco Use   Smoking status: Never    Passive exposure: Never   Smokeless tobacco: Never  Vaping Use   Vaping Use: Never used  Substance and Sexual Activity   Alcohol use: No    Alcohol/week: 0.0 standard drinks of alcohol   Drug use: No   Sexual activity: Never  Other Topics Concern   Not on file  Social History Narrative   09/21/21 resides at [place] alone, son lives beside her   Caffeine - none   Social Determinants of Health   Financial Resource Strain: Low Risk  (05/13/2020)   Overall Financial Resource Strain (CARDIA)  Difficulty of Paying Living Expenses: Not hard at all  Food Insecurity: No Food Insecurity (05/13/2020)   Hunger Vital Sign    Worried About Running Out of Food in the Last Year: Never true    Ran Out of Food in the Last Year: Never true  Transportation Needs: No Transportation Needs (05/13/2020)   PRAPARE - Hydrologist (Medical): No    Lack of Transportation (Non-Medical): No  Physical Activity: Inactive (05/13/2020)   Exercise Vital Sign    Days of Exercise per Week: 0 days    Minutes of Exercise per Session: 0 min  Stress: No Stress Concern Present (05/13/2020)   [place]    Feeling of Stress : Not at all  Social Connections: Socially Isolated  (05/13/2020)   Social Connection and Isolation Panel [NHANES]    Frequency of Communication with Friends and Family: More than three times a week    Frequency of Social Gatherings with Friends and Family: More than three times a week    Attends Religious Services: Never    Marine scientist or Organizations: No    Attends Archivist Meetings: Never    Marital Status: Widowed  Intimate Partner Violence: Not At Risk (05/13/2020)   Humiliation, Afraid, Rape, and Kick questionnaire    Fear of Current or Ex-Partner: No    Emotionally Abused: No    Physically Abused: No    Sexually Abused: No   Family History  Problem Relation Age of Onset   Alzheimer's disease Sister    Cardiomyopathy Brother       VITAL SIGNS BP 137/68   Pulse 60   Temp 98 F (36.7 C)   Resp 20   Ht '5\' 5"'$  (1.651 m)   Wt 112 lb 9.6 oz (51.1 kg)   SpO2 97%   BMI 18.74 kg/m   Outpatient Encounter Medications as of 12/29/2021  Medication Sig   Acetaminophen (TYLENOL ARTHRITIS PAIN PO) Take 650 mg by mouth every 6 (six) hours.   amiodarone (PACERONE) 200 MG tablet TAKE 1 TABLET(200 MG) BY MOUTH DAILY   aspirin 81 MG chewable tablet Chew 1 tablet (81 mg total) by mouth daily.   Balsam [place]-Castor Oil [facility]) OINT Special Instructions: apply to DTI Right & Left medial buttock Every Shift; Day   lidocaine 4 % Place 1 patch onto the skin. place on in the AM and remove at [facility] for right flank pain   methocarbamol (ROBAXIN) 500 MG tablet Take 500 mg by mouth every 8 (eight) hours as needed (back pain).   nitrofurantoin, macrocrystal-monohydrate, (MACROBID) 100 MG capsule Take 100 mg by mouth at bedtime. UTI prophylaxis   NON FORMULARY Diet - Dysphagia 3   ondansetron (ZOFRAN) 4 MG tablet Take 1 tablet (4 mg total) by mouth daily as needed for nausea or vomiting.   senna (SENOKOT) 8.6 MG TABS tablet Take 1 tablet by mouth in the morning and at bedtime.   No facility-administered encounter medications on  file as of 12/29/2021.     SIGNIFICANT DIAGNOSTIC EXAMS  PREVIOUS   08-17-21: ct head stroke:  1. No acute CT finding. Atrophy and chronic small-vessel ischemic changes. 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 2. ASPECTS is 10, allowing for the old infarction  08-17-21: ct angio of head:  1. Decreased perfusion (Tmax> 6 seconds) in the posterior left MCA territory, which correlates with an area with decreased contrast enhancement in the MCA branches.  While there is mild to moderate narrowing in the distal left M1 segment, no additional focal stenosis or occlusion is seen in the left MCA branches. No infarct core. 2. Multifocal narrowing in the bilateral PCAs. No other hemodynamically significant intracranial stenosis. 3. Approximately 50% proximal left ICA stenosis, unchanged. No additional hemodynamically significant stenosis in the neck.  08-17-21: right hip x-ray: Acute displaced right femoral neck fracture.   08-18-21: MRI of brain 1. Moderately motion limited study with evidence of acute left frontal and parietal infarcts. The left parietal infarct is along the posterior aspect of a remote left parietal infarct. 2. Moderate chronic microvascular ischemic disease. 3.  Cerebral atrophy   NO NEW EXAMS.   LABS REVIEWED; PREVIOUS   08-17-21: wbc 19.6 hgb 14.3; hct 45.2 mcv 94.8 plt 209; glucose 144; bun 15; creat 1.22 k+ 3.8; na++ 137; ca 9.1; GFR 42; protein 6.7; albumin 3.8 hgb a1c 5.7 08-18-21: urine culture <10,000. Vitamin B 12: 232; tsh 1.864 08-21-21: vit D 12.78 08-23-21: vitamin B 12: 204; iron 22; tibc 185; ferritin 234;  08-24-21: wbc 9.2; hgb 9.1; hct 27.0; mcv 90.0 plt 154; glucose 104; bun 17; creat 0.76; k + 3.8; na++ 136; ca 8.1; GFR >60  TODAY  09-30-21: tsh 4.221 10-18-21: urine culture: 40,000 colonies: citrobacter freundii     Review of Systems  Constitutional:  Negative for malaise/fatigue.  Respiratory:  Negative for cough and shortness of breath.   Cardiovascular:   Negative for chest pain, palpitations and leg swelling.  Gastrointestinal:  Negative for abdominal pain, constipation and heartburn.  Musculoskeletal:  Negative for back pain, joint pain and myalgias.  Skin: Negative.   Neurological:  Negative for dizziness.  Psychiatric/Behavioral:  The patient is not nervous/anxious.    Physical Exam Constitutional:      General: She is not in acute distress.    Appearance: She is underweight. She is not diaphoretic.  Neck:     Thyroid: No thyromegaly.  Cardiovascular:     Rate and Rhythm: Normal rate and regular rhythm.     Pulses: Normal pulses.     Heart sounds: Murmur heard.     Comments: pacemaker Pulmonary:     Effort: Pulmonary effort is normal. No respiratory distress.     Breath sounds: Normal breath sounds.  Abdominal:     General: Bowel sounds are normal. There is no distension.     Palpations: Abdomen is soft.     Tenderness: There is no abdominal tenderness.  Musculoskeletal:     Cervical back: Neck supple.     Right lower leg: No edema.     Left lower leg: No edema.     Comments: Right hip hemiarthroplasty Is able to move all extremities     Lymphadenopathy:     Cervical: No cervical adenopathy.  Skin:    General: Skin is warm and dry.  Neurological:     Mental Status: She is alert. Mental status is at baseline.  Psychiatric:        Mood and Affect: Mood normal.      ASSESSMENT/ PLAN:  TODAY  Vitamin D deficiency: level: 12.78 will continue 2000 units twice daily will repeat level   2. Vitamin B 12 deficiency: level 204 will continue 1,000 mcg daily will repeat level   3. Aortic atherosclerosis (ct 08/02/21) will continue asa 81 mg daily; off statin due to advanced age.   PREVIOUS   4. History of CVA(cerebral vascular accient) is neurologically stable  will continue asa  81 mg daily will monitor  5. Atrial fibrillation / sick sinus syndrome: heart rate is stable; will continue amiodarone 200 mg daily lopressor  37.5 mg twice daily for rate control; will continue xarelto 15 mg daily   6. Primary hypertension: b/p 137/68: will continue lopressor 37.5 mg twice daily   7. Gastroesophageal reflux disease without esophagitis: will continue protonix 40 mg daily   8.  Closed displaced fracture of right femoral neck: is stable will continue to monitor her status.   9. Chronic kidney disease stage 3b: bun 17; creat 0.76; gfr >60  10. Acute post operative anemia due to expected blood loss: hgb 9.1 will monitor   11. Mixed hyperlipidemia: is off lipitor    Ok Edwards NP [facility]   call [phone]

## 2022-01-10 ENCOUNTER — Other Ambulatory Visit: Payer: Self-pay | Admitting: Family Medicine

## 2022-01-12 ENCOUNTER — Non-Acute Institutional Stay (SKILLED_NURSING_FACILITY): Payer: Medicare Other | Admitting: Adult Health

## 2022-01-12 ENCOUNTER — Encounter: Payer: Self-pay | Admitting: Adult Health

## 2022-01-12 DIAGNOSIS — M810 Age-related osteoporosis without current pathological fracture: Secondary | ICD-10-CM | POA: Diagnosis not present

## 2022-01-12 NOTE — Progress Notes (Signed)
Location:  [place] Room Number: 784O Place of Service:  SNF (31)   CODE STATUS: DNR  Allergies  Allergen Reactions   Septra Ds [Sulfamethoxazole-Trimethoprim]    Lisinopril Other (See Comments)    Cough    Chief Complaint  Patient presents with   Acute Visit    Osteoporosis    HPI:  Her t score is -4.184. she does have chronic back pain. Her calcium level is low at 8.3. she does get out of bed daily. She is status post 2016: left IM nailing; 08-20-21: right hip arthroplasty; 2022: lumbar surgery. Her t score is significant and will need prolia. Her last fracture was 08-20-21.   Past Medical History:  Diagnosis Date   Acute metabolic encephalopathy 96/2952   Atrial fibrillation ([place])    Bleeding stomach ulcer 1980s?   CVA (cerebral vascular accident) ([place]) 07/2021   DIVERTICULOSIS, COLON 11/22/2006   Headache(784.0)    "very often; not regular" (11/25/2015)   Heart block    hx of   Hip fracture ([place]) 07/2021   History of blood transfusion 1980s   "when I had the bleeding ulcer"   HYPERLIPIDEMIA 11/22/2006   Hypertension    Melanoma ([place])    LLE   Menopausal syndrome (hot flashes)    OSTEOPOROSIS 11/22/2006   Presence of permanent cardiac pacemaker    Vitamin D deficiency 08/21/2021    Past Surgical History:  Procedure Laterality Date   APPENDECTOMY     BUNIONECTOMY Right    CATARACT EXTRACTION W/ INTRAOCULAR LENS  IMPLANT, BILATERAL Bilateral 2000s   CHOLECYSTECTOMY OPEN     EP IMPLANTABLE DEVICE N/A 11/25/2015   Procedure: Pacemaker Implant;  Surgeon: Will Meredith Leeds, MD;  Location: [place] [facility];  Service: Cardiovascular;  Laterality: N/A;   FEMUR IM NAIL Left 07/08/2014   Procedure: INTRAMEDULLARY (IM) NAIL ;  Surgeon: Meredith Pel, MD;  [facility]: [facility];  Service: Orthopedics;  Laterality: Left;   FRACTURE SURGERY     HIP ARTHROPLASTY Right 08/20/2021   Procedure: ARTHROPLASTY BIPOLAR HIP (HEMIARTHROPLASTY);  Surgeon:  Altamese Whiting, MD;  Location: [place];  Service: Orthopedics;  Laterality: Right;   IR KYPHO LUMBAR INC FX REDUCE BONE BX UNI/BIL CANNULATION INC/IMAGING  03/21/2021   PACEMAKER INSERTION     REVISION TOTAL HIP ARTHROPLASTY Left 06/2014    Social History   Socioeconomic History   Marital status: Widowed    Spouse name: Not on file   Number of children: 2   Years of education: 70   Highest education level: Not on file  Occupational History    Comment: retired  Tobacco Use   Smoking status: Never    Passive exposure: Never   Smokeless tobacco: Never  Vaping Use   Vaping Use: Never used  Substance and Sexual Activity   Alcohol use: No    Alcohol/week: 0.0 standard drinks of alcohol   Drug use: No   Sexual activity: Never  Other Topics Concern   Not on file  Social History Narrative   09/21/21 resides at [place] alone, son lives beside her   Caffeine - none   Social Determinants of Health   Financial Resource Strain: Low Risk  (05/13/2020)   Overall Financial Resource Strain (CARDIA)    Difficulty of Paying Living Expenses: Not hard at all  Food Insecurity: No Food Insecurity (05/13/2020)   Hunger Vital Sign    Worried About Running Out of Food in  the Last Year: Never true    Reader in the Last Year: Never true  Transportation Needs: No Transportation Needs (05/13/2020)   PRAPARE - Hydrologist (Medical): No    Lack of Transportation (Non-Medical): No  Physical Activity: Inactive (05/13/2020)   Exercise Vital Sign    Days of Exercise per Week: 0 days    Minutes of Exercise per Session: 0 min  Stress: No Stress Concern Present (05/13/2020)   [place]    Feeling of Stress : Not at all  Social Connections: Socially Isolated (05/13/2020)   Social Connection and Isolation Panel [NHANES]    Frequency of Communication with Friends and Family: More than three  times a week    Frequency of Social Gatherings with Friends and Family: More than three times a week    Attends Religious Services: Never    Marine scientist or Organizations: No    Attends Archivist Meetings: Never    Marital Status: Widowed  Intimate Partner Violence: Not At Risk (05/13/2020)   Humiliation, Afraid, Rape, and Kick questionnaire    Fear of Current or Ex-Partner: No    Emotionally Abused: No    Physically Abused: No    Sexually Abused: No   Family History  Problem Relation Age of Onset   Alzheimer's disease Sister    Cardiomyopathy Brother       VITAL SIGNS BP 137/69   Pulse 80   Temp (!) 97.5 F (36.4 C) (Skin)   Resp 20   Ht '5\' 5"'$  (1.651 m)   Wt 112 lb 9.6 oz (51.1 kg)   SpO2 97%   BMI 18.74 kg/m   Outpatient Encounter Medications as of 01/12/2022  Medication Sig   Acetaminophen (TYLENOL ARTHRITIS PAIN PO) Take 650 mg by mouth every 6 (six) hours.   amiodarone (PACERONE) 200 MG tablet TAKE 1 TABLET(200 MG) BY MOUTH DAILY   aspirin 81 MG chewable tablet Chew 1 tablet (81 mg total) by mouth daily.   Balsam [place]-Castor Oil [facility]) OINT Special Instructions: apply to DTI Right & Left medial buttock Every Shift; Day   gabapentin (NEURONTIN) 100 MG capsule Take 100 mg by mouth at bedtime.   lidocaine 4 % Place 1 patch onto the skin. place on in the AM and remove at [facility] for right flank pain   magnesium hydroxide (MILK OF MAGNESIA) 400 MG/5ML suspension Take 30 mLs by mouth daily as needed for mild constipation.   methocarbamol (ROBAXIN) 500 MG tablet Take 500 mg by mouth every 8 (eight) hours as needed (back pain).   nitrofurantoin, macrocrystal-monohydrate, (MACROBID) 100 MG capsule Take 100 mg by mouth at bedtime. UTI prophylaxis   NON FORMULARY Diet - Dysphagia 3   ondansetron (ZOFRAN) 4 MG tablet Take 1 tablet (4 mg total) by mouth daily as needed for nausea or vomiting.   pantoprazole (PROTONIX) 40 MG tablet Take 40 mg by mouth daily.    senna (SENOKOT) 8.6 MG TABS tablet Take 1 tablet by mouth in the morning and at bedtime.   [DISCONTINUED] pantoprazole (PROTONIX) 40 MG tablet Take 1 tablet (40 mg total) by mouth daily.   No facility-administered encounter medications on file as of 01/12/2022.     SIGNIFICANT DIAGNOSTIC EXAMS  PREVIOUS   08-17-21: ct head stroke:  1. No acute CT finding. Atrophy and chronic small-vessel ischemic changes. 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 2. ASPECTS is 10, allowing  for the old infarction  08-17-21: ct angio of head:  1. Decreased perfusion (Tmax> 6 seconds) in the posterior left MCA territory, which correlates with an area with decreased contrast enhancement in the MCA branches. While there is mild to moderate narrowing in the distal left M1 segment, no additional focal stenosis or occlusion is seen in the left MCA branches. No infarct core. 2. Multifocal narrowing in the bilateral PCAs. No other hemodynamically significant intracranial stenosis. 3. Approximately 50% proximal left ICA stenosis, unchanged. No additional hemodynamically significant stenosis in the neck.  08-17-21: right hip x-ray: Acute displaced right femoral neck fracture.   08-18-21: MRI of brain 1. Moderately motion limited study with evidence of acute left frontal and parietal infarcts. The left parietal infarct is along the posterior aspect of a remote left parietal infarct. 2. Moderate chronic microvascular ischemic disease. 3.  Cerebral atrophy   TODAY  01-04-22: dexa scan: t score -4.184   LABS REVIEWED; PREVIOUS   08-17-21: wbc 19.6 hgb 14.3; hct 45.2 mcv 94.8 plt 209; glucose 144; bun 15; creat 1.22 k+ 3.8; na++ 137; ca 9.1; GFR 42; protein 6.7; albumin 3.8 hgb a1c 5.7 08-18-21: urine culture <10,000. Vitamin B 12: 232; tsh 1.864 08-21-21: vit D 12.78 08-23-21: vitamin B 12: 204; iron 22; tibc 185; ferritin 234;  08-24-21: wbc 9.2; hgb 9.1; hct 27.0; mcv 90.0 plt 154; glucose 104; bun 17; creat 0.76; k + 3.8;  na++ 136; ca 8.1; GFR >60  TODAY  09-30-21: tsh 4.221 10-18-21: urine culture: 40,000 colonies: citrobacter freundii    Review of Systems  Constitutional:  Negative for malaise/fatigue.  Respiratory:  Negative for cough and shortness of breath.   Cardiovascular:  Negative for chest pain, palpitations and leg swelling.  Gastrointestinal:  Negative for abdominal pain, constipation and heartburn.  Musculoskeletal:  Negative for back pain, joint pain and myalgias.       Has chronic back pain   Skin: Negative.   Neurological:  Negative for dizziness.  Psychiatric/Behavioral:  The patient is not nervous/anxious.     Physical Exam Constitutional:      General: She is not in acute distress.    Appearance: She is well-developed. She is not diaphoretic.  Neck:     Thyroid: No thyromegaly.  Cardiovascular:     Rate and Rhythm: Normal rate and regular rhythm.     Pulses: Normal pulses.     Heart sounds: Murmur heard.     Comments: pacemaker Pulmonary:     Effort: Pulmonary effort is normal. No respiratory distress.     Breath sounds: Normal breath sounds.  Abdominal:     General: Bowel sounds are normal. There is no distension.     Palpations: Abdomen is soft.     Tenderness: There is no abdominal tenderness.  Musculoskeletal:     Cervical back: Neck supple.     Right lower leg: No edema.     Left lower leg: No edema.     Comments:  Right hip hemiarthroplasty Is able to move all extremities      Lymphadenopathy:     Cervical: No cervical adenopathy.  Skin:    General: Skin is warm and dry.  Neurological:     Mental Status: She is alert. Mental status is at baseline.  Psychiatric:        Mood and Affect: Mood normal.       ASSESSMENT/ PLAN:  TODAY  Age related osteoporosis without current pathological fracture: will check bmp and vitamin D;  will begin ca++ 600 mg daily and will begin prolia twice yearly.    Ok Edwards NP [facility] Adult Medicine  call [phone]

## 2022-01-16 ENCOUNTER — Encounter (HOSPITAL_COMMUNITY)
Admission: RE | Admit: 2022-01-16 | Discharge: 2022-01-16 | Disposition: A | Discharge status: Home / Self Care | Payer: Medicare Other | Source: Skilled Nursing Facility | Attending: Adult Health | Admitting: Adult Health

## 2022-01-16 DIAGNOSIS — M810 Age-related osteoporosis without current pathological fracture: Secondary | ICD-10-CM | POA: Insufficient documentation

## 2022-01-16 DIAGNOSIS — I10 Essential (primary) hypertension: Secondary | ICD-10-CM | POA: Diagnosis not present

## 2022-01-16 LAB — BASIC METABOLIC PANEL
Anion gap: 8 (ref 5–15)
BUN: 19 mg/dL (ref 8–23)
CO2: 21 mmol/L — ABNORMAL LOW (ref 22–32)
Calcium: 8.7 mg/dL — ABNORMAL LOW (ref 8.9–10.3)
Chloride: 110 mmol/L (ref 98–111)
Creatinine, Ser: 0.91 mg/dL (ref 0.44–1.00)
GFR, Estimated: 60 mL/min — ABNORMAL LOW (ref >60)
Glucose, Bld: 61 mg/dL — ABNORMAL LOW (ref 70–99)
Potassium: 4.6 mmol/L (ref 3.5–5.1)
Sodium: 139 mmol/L (ref 135–145)

## 2022-01-16 LAB — VITAMIN D 25 HYDROXY (VIT D DEFICIENCY, FRACTURES): Vit D, 25-Hydroxy: 29.35 ng/mL — ABNORMAL LOW (ref 30–100)

## 2022-01-18 ENCOUNTER — Non-Acute Institutional Stay (SKILLED_NURSING_FACILITY): Payer: Medicare Other | Admitting: Internal Medicine

## 2022-01-18 ENCOUNTER — Encounter: Payer: Self-pay | Admitting: Internal Medicine

## 2022-01-18 DIAGNOSIS — E559 Vitamin D deficiency, unspecified: Secondary | ICD-10-CM | POA: Diagnosis not present

## 2022-01-18 DIAGNOSIS — M545 Low back pain, unspecified: Secondary | ICD-10-CM | POA: Diagnosis not present

## 2022-01-18 DIAGNOSIS — N1832 Chronic kidney disease, stage 3b: Secondary | ICD-10-CM | POA: Diagnosis not present

## 2022-01-18 DIAGNOSIS — I482 Chronic atrial fibrillation, unspecified: Secondary | ICD-10-CM | POA: Diagnosis not present

## 2022-01-18 DIAGNOSIS — E440 Moderate protein-calorie malnutrition: Secondary | ICD-10-CM

## 2022-01-18 DIAGNOSIS — D649 Anemia, unspecified: Secondary | ICD-10-CM

## 2022-01-18 NOTE — Assessment & Plan Note (Signed)
Rhythm is regular and slow in the context of pacemaker placement.  Most recent EKG 08/17/2021 revealed sinus bradycardia with a rate of 60 with left anterior fascicular block, incomplete right bundle branch block, and low voltage.  She remains on amiodarone.

## 2022-01-18 NOTE — Patient Instructions (Signed)
See assessment and plan under each diagnosis in the problem list and acutely for this visit 

## 2022-01-18 NOTE — Progress Notes (Unsigned)
NURSING HOME LOCATION:  [facility] ROOM NUMBER:  155 P  CODE STATUS:  DNR  PCP:  Ok Edwards NP  This is a nursing facility follow up visit of chronic medical diagnoses & to document compliance with Regulation 483.30 (c) in The [place] Manual Phase 2 which mandates caregiver visit ( visits can alternate among physician, PA or NP as per statutes) within 10 days of 30 days / 60 days/ 90 days post admission to SNF date    Interim medical record and care since last SNF visit was updated with review of diagnostic studies and change in clinical status since last visit were documented.  HPI: She is a permanent resident this facility with medical diagnoses of atrial fibrillation, history of bleeding gastric ulcer, history of stroke, diverticulosis, dyslipidemia, essential hypertension, history of melanoma, vitamin D deficiency, and osteoporosis. Pertinent surgeries and procedures include cholecystectomy, pacemaker insertion, and THA bilaterally.  Current labs reveal mild hypocalcemia with a value of 8.7, improved from prior value of 8.3 and CKD stage II with creatinine of 0.91 and GFR of 60.  Vitamin D level is minimally reduced at 29.35. Most recent total protein and albumin levels document protein/caloric malnutrition with albumin 2.7 and total protein of 5.7.  Current TSH is therapeutic; she is on amiodarone.Serially she has a stable normochromic, normocytic anemia with most recent H/H of 9/28.9.  A1c's have been prediabetic with most recent value of 5.7%.  Review of systems: She is an excellent historian and very communicative.  She states that she is doing "okay." Her major complaint is dull back pain which is intermittently worse when she is supine.  This is in the context of a history of lumbar fracture sustained in a mechanical fall.  The pain does respond to Robaxin.  There is no associated radicular quality and there is no associated urinary or stool  incontinence.   Other complaints include intermittent edema without cardiopulmonary symptoms.  She also has constipation.  Despite the anemia she denies any bleeding dyscrasias.  Constitutional: No fever, significant weight change, fatigue  Eyes: No redness, discharge, pain, vision change ENT/mouth: No nasal congestion,  purulent discharge, earache, change in hearing, sore throat  Cardiovascular: No chest pain, palpitations, paroxysmal nocturnal dyspnea  Respiratory: No cough, sputum production, hemoptysis, DOE, significant snoring, apnea   Gastrointestinal: No heartburn, dysphagia, abdominal pain, nausea /vomiting, rectal bleeding, melena Genitourinary: No dysuria, hematuria, pyuria, incontinence, nocturia Musculoskeletal: No joint stiffness, joint swelling Dermatologic: No rash, pruritus, change in appearance of skin Neurologic: No dizziness, headache, syncope, seizures, numbness, tingling Psychiatric: No significant anxiety, depression, insomnia, anorexia Endocrine: No change in hair/skin/nails, excessive thirst, excessive hunger, excessive urination  Hematologic/lymphatic: No significant bruising, lymphadenopathy, abnormal bleeding Allergy/immunology: No itchy/watery eyes, significant sneezing, urticaria, angioedema  Physical exam:  Pertinent or positive findings: She appears frail and somewhat chronically ill.  Heart rate is slow and regular.  Pacer is present.  Bronchovesicular breath sounds are suggested.  She has 1/2+ edema in the feet.  Deep tendon reflexes are 0-1/2+.  Strength is greater in the lower extremities than in the upper extremities and the left upper extremity is stronger to opposition than the right.  She has marked interosseous wasting as well as limb atrophy.  Also temporal wasting is present.  General appearance: no acute distress, increased work of breathing is present.   Lymphatic: No lymphadenopathy about the head, neck, axilla. Eyes: No conjunctival inflammation or  lid edema is present. There is no  scleral icterus. Ears:  External ear exam shows no significant lesions or deformities.   Nose:  External nasal examination shows no deformity or inflammation. Nasal mucosa are pink and moist without lesions, exudates Oral exam:  Lips and gums are healthy appearing. There is no oropharyngeal erythema or exudate. Neck:  No thyromegaly, masses, tenderness noted.    Heart:  No gallop, murmur, click, rub .  Lungs:  without wheezes, rhonchi, rales, rubs. Abdomen: Bowel sounds are normal. Abdomen is soft and nontender with no organomegaly, hernias, masses. GU: Deferred  Extremities:  No cyanosis, clubbing Neurologic exam :Balance, Rhomberg, finger to nose testing could not be completed due to clinical state Skin: Warm & dry w/o tenting. No significant lesions or rash.  See summary under each active problem in the Problem List with associated updated therapeutic plan

## 2022-01-18 NOTE — Assessment & Plan Note (Signed)
This is her major complaint.  Is described as dull without radicular character or associated stool or urinary incontinence.  Robaxin is subjectively beneficial and will be continued.

## 2022-01-18 NOTE — Assessment & Plan Note (Addendum)
Minimal reduction in vitamin D at 29.35.  Calcium has improved from 8.3-8.7.  Continue vitamin D and calcium supplementation.

## 2022-01-18 NOTE — Assessment & Plan Note (Signed)
Current creatinine is 0.91 and GFR 60 indicating borderline stage II/high 3A CKD.  Med list reviewed; no indications for any change in present regimen.

## 2022-01-18 NOTE — Assessment & Plan Note (Signed)
She denies any bleeding dyscrasias and none have been reported by staff.  Continue to monitor CBC.

## 2022-01-19 DIAGNOSIS — E46 Unspecified protein-calorie malnutrition: Secondary | ICD-10-CM | POA: Insufficient documentation

## 2022-01-19 NOTE — Assessment & Plan Note (Addendum)
Current albumin 2.7 & total protein 5.7. Limb atrophy, interosseous & temporal wasting present. Nutrition to consult @ SNF.

## 2022-01-26 DIAGNOSIS — C4441 Basal cell carcinoma of skin of scalp and neck: Secondary | ICD-10-CM | POA: Diagnosis not present

## 2022-02-06 ENCOUNTER — Ambulatory Visit (INDEPENDENT_AMBULATORY_CARE_PROVIDER_SITE_OTHER): Payer: Medicare Other

## 2022-02-06 DIAGNOSIS — I495 Sick sinus syndrome: Secondary | ICD-10-CM | POA: Diagnosis not present

## 2022-02-06 LAB — CUP PACEART REMOTE DEVICE CHECK
Battery Remaining Longevity: 53 mo
Battery Voltage: 2.99 V
Brady Statistic AP VP Percent: 0.06 %
Brady Statistic AP VS Percent: 96.63 %
Brady Statistic AS VP Percent: 0 %
Brady Statistic AS VS Percent: 3.31 %
Brady Statistic RA Percent Paced: 96.68 %
Brady Statistic RV Percent Paced: 0.06 %
Date Time Interrogation Session: 20230917123353
Implantable Lead Implant Date: 20170706
Implantable Lead Implant Date: 20170706
Implantable Lead Location: 753859
Implantable Lead Location: 753860
Implantable Lead Model: 5076
Implantable Lead Model: 5076
Implantable Pulse Generator Implant Date: 20170706
Lead Channel Impedance Value: 342 Ohm
Lead Channel Impedance Value: 456 Ohm
Lead Channel Impedance Value: 456 Ohm
Lead Channel Impedance Value: 608 Ohm
Lead Channel Pacing Threshold Amplitude: 0.625 V
Lead Channel Pacing Threshold Amplitude: 1.25 V
Lead Channel Pacing Threshold Pulse Width: 0.4 ms
Lead Channel Pacing Threshold Pulse Width: 0.4 ms
Lead Channel Sensing Intrinsic Amplitude: 1.375 mV
Lead Channel Sensing Intrinsic Amplitude: 1.375 mV
Lead Channel Sensing Intrinsic Amplitude: 22.25 mV
Lead Channel Sensing Intrinsic Amplitude: 22.25 mV
Lead Channel Setting Pacing Amplitude: 1.5 V
Lead Channel Setting Pacing Amplitude: 2.5 V
Lead Channel Setting Pacing Pulse Width: 0.4 ms
Lead Channel Setting Sensing Sensitivity: 2.8 mV

## 2022-02-07 ENCOUNTER — Encounter: Payer: Medicare Other | Attending: Adult Health | Admitting: Cardiology

## 2022-02-07 ENCOUNTER — Encounter: Payer: Self-pay | Admitting: Cardiology

## 2022-02-07 VITALS — BP 160/88 | HR 61 | Ht 65.0 in | Wt 112.0 lb

## 2022-02-07 DIAGNOSIS — I495 Sick sinus syndrome: Secondary | ICD-10-CM | POA: Insufficient documentation

## 2022-02-07 DIAGNOSIS — I480 Paroxysmal atrial fibrillation: Secondary | ICD-10-CM | POA: Insufficient documentation

## 2022-02-07 NOTE — Progress Notes (Signed)
Electrophysiology Office Note   Date:  02/07/2022   ID:  CAROLYNA YERIAN, DOB 1929-02-03, MRN [id number]  PCP:  Gerlene Fee, NP  Primary Electrophysiologist: Constance Haw, MD    No chief complaint on file.    History of Present Illness: [name] Small is a 88 y.o. female who presents today for electrophysiology evaluation.     She has a history significant for paroxysmal atrial fibrillation diagnosed in 2018.  She also has falls in 2016 with a hip fracture.  She was having issues with bradycardia.  She had an episode of atrial fibrillation with rapid rates.  She is now status post Medtronic dual-chamber pacemaker implanted 11/25/2015 for sick sinus syndrome.  Today, denies symptoms of palpitations, chest pain, shortness of breath, orthopnea, PND, lower extremity edema, claudication, dizziness, presyncope, syncope, bleeding, or neurologic sequela. The patient is tolerating medications without difficulties.  Since being seen she has done well from a cardiac perspective.  Her pacemaker is functioning appropriately minimal atrial fibrillation.  She is quite frustrated as she is in a wheelchair and has been in a nursing home.  She would like to go home but she understands that she would need 24-hour care.    Past Medical History:  Diagnosis Date   Acute metabolic encephalopathy 40/1027   Atrial fibrillation ([place])    Bleeding stomach ulcer 1980s?   CVA (cerebral vascular accident) ([place]) 07/2021   DIVERTICULOSIS, COLON 11/22/2006   Headache(784.0)    "very often; not regular" (11/25/2015)   Heart block    hx of   Hip fracture ([place]) 07/2021   History of blood transfusion 1980s   "when I had the bleeding ulcer"   HYPERLIPIDEMIA 11/22/2006   Hypertension    Melanoma ([place])    LLE   Menopausal syndrome (hot flashes)    OSTEOPOROSIS 11/22/2006   Presence of permanent cardiac pacemaker    Vitamin D deficiency 08/21/2021   Past Surgical History:  Procedure Laterality Date    APPENDECTOMY     BUNIONECTOMY Right    CATARACT EXTRACTION W/ INTRAOCULAR LENS  IMPLANT, BILATERAL Bilateral 2000s   CHOLECYSTECTOMY OPEN     EP IMPLANTABLE DEVICE N/A 11/25/2015   Procedure: Pacemaker Implant;  Surgeon: Keeyon Privitera Meredith Leeds, MD;  Location: [facility];  Service: Cardiovascular;  Laterality: N/A;   FEMUR IM NAIL Left 07/08/2014   Procedure: INTRAMEDULLARY (IM) NAIL ;  Surgeon: Meredith Pel, MD;  Location: [facility];  Service: Orthopedics;  Laterality: Left;   FRACTURE SURGERY     HIP ARTHROPLASTY Right 08/20/2021   Procedure: ARTHROPLASTY BIPOLAR HIP (HEMIARTHROPLASTY);  Surgeon: Altamese Roosevelt, MD;  Location: [facility];  Service: Orthopedics;  Laterality: Right;   IR KYPHO LUMBAR INC FX REDUCE BONE BX UNI/BIL CANNULATION INC/IMAGING  03/21/2021   PACEMAKER INSERTION     REVISION TOTAL HIP ARTHROPLASTY Left 06/2014     Current Outpatient Medications  Medication Sig Dispense Refill   Acetaminophen (TYLENOL ARTHRITIS PAIN PO) Take 650 mg by mouth every 6 (six) hours.     amiodarone (PACERONE) 200 MG tablet TAKE 1 TABLET(200 MG) BY MOUTH DAILY 90 tablet 2   aspirin 81 MG chewable tablet Chew 1 tablet (81 mg total) by mouth daily. 30 tablet 0   Balsam [place]-Castor Oil (VENELEX) OINT Special Instructions: apply to DTI Right & Left medial buttock Every Shift; Day     gabapentin (NEURONTIN) 100 MG capsule Take 100 mg by mouth at bedtime.     lidocaine 4 %  Place 1 patch onto the skin. place on in the AM and remove at [facility] for right flank pain     magnesium hydroxide (MILK OF MAGNESIA) 400 MG/5ML suspension Take 30 mLs by mouth daily as needed for mild constipation.     methocarbamol (ROBAXIN) 500 MG tablet Take 500 mg by mouth every 8 (eight) hours as needed (back pain).     nitrofurantoin, macrocrystal-monohydrate, (MACROBID) 100 MG capsule Take 100 mg by mouth at bedtime. UTI prophylaxis     NON FORMULARY Diet - Dysphagia 3     ondansetron (ZOFRAN) 4 MG tablet Take 1 tablet  (4 mg total) by mouth daily as needed for nausea or vomiting. 30 tablet 1   pantoprazole (PROTONIX) 40 MG tablet Take 40 mg by mouth daily.     senna (SENOKOT) 8.6 MG TABS tablet Take 1 tablet by mouth in the morning and at bedtime.     No current facility-administered medications for this visit.    Allergies:   Septra ds [sulfamethoxazole-trimethoprim] and Lisinopril   Social History:  The patient  reports that she has never smoked. She has never been exposed to tobacco smoke. She has never used smokeless tobacco. She reports that she does not drink alcohol and does not use drugs.   Family History:  The patient's family history includes Alzheimer's disease in her sister; Cardiomyopathy in her brother.   ROS:  Please see the history of present illness.   Otherwise, review of systems is positive for none.   All other systems are reviewed and negative.   PHYSICAL EXAM: VS:  BP (!) 144/80   Pulse 61   Ht '5\' 5"'$  (1.651 m)   Wt 112 lb (50.8 kg)   SpO2 99%   BMI 18.64 kg/m  , BMI Body mass index is 18.64 kg/m. GEN: Well nourished, well developed, in no acute distress  HEENT: normal  Neck: no JVD, carotid bruits, or masses Cardiac: RRR; no murmurs, rubs, or gallops,no edema  Respiratory:  clear to auscultation bilaterally, normal work of breathing GI: soft, nontender, nondistended, + BS MS: no deformity or atrophy  Skin: warm and dry, device site well healed Neuro:  Strength and sensation are intact Psych: euthymic mood, full affect  EKG:  EKG is ordered today. Personal review of the ekg ordered shows atrial paced, rate 61  Personal review of the device interrogation today. Results in [place]: 09/01/2021: ALT 20; Hemoglobin 9.0; Magnesium 2.3; Platelets 340 09/30/2021: TSH 4.221 01/16/2022: BUN 19; Creatinine, Ser 0.91; Potassium 4.6; Sodium 139    Lipid Panel     Component Value Date/Time   CHOL 163 08/01/2017 1047   TRIG 67.0 08/01/2017 1047   HDL 75.50  08/01/2017 1047   CHOLHDL 2 08/01/2017 1047   VLDL 13.4 08/01/2017 1047   LDLCALC 74 08/01/2017 1047   LDLDIRECT 64.0 08/17/2021 1842     Wt Readings from Last 3 Encounters:  02/07/22 112 lb (50.8 kg)  01/18/22 112 lb 9.6 oz (51.1 kg)  01/12/22 112 lb 9.6 oz (51.1 kg)      Other studies Reviewed: Additional studies/ records that were reviewed today include: Holter 03/02/15  Review of the above records today demonstrates: Sinus rhythm sinus bradycardia PVC;s with slow bigeminny.  If PVC;s not perfused effective HR as low as 24 bpm PAC;s and short bursts of atrial tachycardia less than 10 beats Average HR 54 bpm    TTE - Normal LV function; elevated LV filling pressure; mild LAE;  trace AI, MR and TR.  ASSESSMENT AND PLAN:  1.  Paroxysmal atrial fibrillation: Currently on amiodarone 200 mg daily.  High risk medication monitoring for amiodarone.  She has a history of a bleeding gastric ulcer and is not anticoagulated.  0 atrial fibrillation on device interrogation.  No changes.  2.  Sick sinus syndrome: Status post Medtronic dual-chamber pacemaker implanted 11/25/2015.  Device functioning appropriately.  No changes.  3.  Hypertension: Elevated today.  Charita Lindenberger have her facility check daily blood pressures and call her primary physician if they are elevated.  4.  Hyperlipidemia: Continue statin.  Followed by PCP.   Current medicines are reviewed at length with the patient today.   The patient does not have concerns regarding her medicines.  The following changes were made today: None  Labs/ tests ordered today include: none  Orders Placed This Encounter  Procedures   EKG 12-Lead     Disposition:   FU 12 months  Signed, Denym Rahimi Meredith Leeds, MD  02/07/2022 2:59 PM     [facility] [address] [place] [address] [phone] (office) [phone] (fax)

## 2022-02-07 NOTE — Patient Instructions (Addendum)
Medication Instructions:  Your physician recommends that you continue on your current medications as directed. Please refer to the Current Medication list given to you today.  *If you need a refill on your cardiac medications before your next appointment, please call your pharmacy*   Lab Work: None ordered   Testing/Procedures: None ordered   Follow-Up: At [facility], you and your health needs are our priority.  As part of our continuing mission to provide you with exceptional heart care, we have created designated Provider Care Teams.  These Care Teams include your primary Cardiologist (physician) and Advanced Practice Providers (APPs -  Physician Assistants and Nurse Practitioners) who all work together to provide you with the care you need, when you need it.  We recommend signing up for the patient portal called "MyChart".  Sign up information is provided on this After Visit Summary.  MyChart is used to connect with patients for Virtual Visits (Telemedicine).  Patients are able to view lab/test results, encounter notes, upcoming appointments, etc.  Non-urgent messages can be sent to your provider as well.   To learn more about what you can do with MyChart, go to [email or web address].    Remote monitoring is used to monitor your Pacemaker or ICD from home. This monitoring reduces the number of office visits required to check your device to one time per year. It allows [place] to keep an eye on the functioning of your device to ensure it is working properly. You are scheduled for a device check from home on 02/15/22. You may send your transmission at any time that day. If you have a wireless device, the transmission will be sent automatically. After your physician reviews your transmission, you will receive a postcard with your next transmission date.  Your next appointment:   1 year(s)  The format for your next appointment:   In Person  Provider:   Allegra Lai, MD    Thank you for  choosing [facility]!!   Trinidad Curet, RN [phone]    Other Instructions Monitor your blood  pressure daily, please call your primary doctor if numbers are greater than 140s/80s    Important Information About Sugar

## 2022-02-16 DIAGNOSIS — I482 Chronic atrial fibrillation, unspecified: Secondary | ICD-10-CM | POA: Diagnosis not present

## 2022-02-16 DIAGNOSIS — Z1383 Encounter for screening for respiratory disorder NEC: Secondary | ICD-10-CM | POA: Diagnosis not present

## 2022-02-16 DIAGNOSIS — Z1159 Encounter for screening for other viral diseases: Secondary | ICD-10-CM | POA: Diagnosis not present

## 2022-02-21 NOTE — Progress Notes (Signed)
Remote pacemaker transmission.   

## 2022-02-22 DIAGNOSIS — Z23 Encounter for immunization: Secondary | ICD-10-CM | POA: Diagnosis not present

## 2022-02-27 ENCOUNTER — Encounter: Payer: Self-pay | Admitting: Adult Health

## 2022-02-27 ENCOUNTER — Non-Acute Institutional Stay (SKILLED_NURSING_FACILITY): Payer: Medicare Other | Admitting: Adult Health

## 2022-02-27 DIAGNOSIS — I482 Chronic atrial fibrillation, unspecified: Secondary | ICD-10-CM

## 2022-02-27 DIAGNOSIS — I10 Essential (primary) hypertension: Secondary | ICD-10-CM | POA: Diagnosis not present

## 2022-02-27 DIAGNOSIS — I495 Sick sinus syndrome: Secondary | ICD-10-CM | POA: Diagnosis not present

## 2022-02-27 DIAGNOSIS — Z8673 Personal history of transient ischemic attack (TIA), and cerebral infarction without residual deficits: Secondary | ICD-10-CM | POA: Diagnosis not present

## 2022-02-27 DIAGNOSIS — Z1383 Encounter for screening for respiratory disorder NEC: Secondary | ICD-10-CM | POA: Diagnosis not present

## 2022-02-27 DIAGNOSIS — Z1159 Encounter for screening for other viral diseases: Secondary | ICD-10-CM | POA: Diagnosis not present

## 2022-02-27 NOTE — Progress Notes (Signed)
Location:  [facility] Room Number: 38 Place of Service:  SNF (31)   CODE STATUS: dnr   Allergies  Allergen Reactions   Septra Ds [Sulfamethoxazole-Trimethoprim]    Lisinopril Other (See Comments)    Cough    Chief Complaint  Patient presents with   Medical Management of Chronic Issues                        History of CVA (cerebral vascular accident)  Atrial fibrillation/sick sinus syndrome;  Primary hypertension:     HPI:  She is a 88 year old long term resident of this facility being seen for the management of her chronic illnesses: History of CVA (cerebral vascular accident)  Atrial fibrillation/sick sinus syndrome;  Primary hypertension. There are no reports of uncontrolled pain. No reports of palpitations. There are no reports of changes in appetite; weight is without change.   Past Medical History:  Diagnosis Date   Acute metabolic encephalopathy 66/0630   Atrial fibrillation ([place])    Bleeding stomach ulcer 1980s?   CVA (cerebral vascular accident) ([place]) 07/2021   DIVERTICULOSIS, COLON 11/22/2006   Headache(784.0)    "very often; not regular" (11/25/2015)   Heart block    hx of   Hip fracture ([place]) 07/2021   History of blood transfusion 1980s   "when I had the bleeding ulcer"   HYPERLIPIDEMIA 11/22/2006   Hypertension    Melanoma ([place])    LLE   Menopausal syndrome (hot flashes)    OSTEOPOROSIS 11/22/2006   Presence of permanent cardiac pacemaker    Vitamin D deficiency 08/21/2021    Past Surgical History:  Procedure Laterality Date   APPENDECTOMY     BUNIONECTOMY Right    CATARACT EXTRACTION W/ INTRAOCULAR LENS  IMPLANT, BILATERAL Bilateral 2000s   CHOLECYSTECTOMY OPEN     EP IMPLANTABLE DEVICE N/A 11/25/2015   Procedure: Pacemaker Implant;  Surgeon: Will Meredith Leeds, MD;  Location: [facility];  Service: Cardiovascular;  Laterality: N/A;   FEMUR IM NAIL Left 07/08/2014   Procedure: INTRAMEDULLARY (IM) NAIL ;  Surgeon:  Meredith Pel, MD;  Location: [facility];  Service: Orthopedics;  Laterality: Left;   FRACTURE SURGERY     HIP ARTHROPLASTY Right 08/20/2021   Procedure: ARTHROPLASTY BIPOLAR HIP (HEMIARTHROPLASTY);  Surgeon: Altamese Hudson, MD;  Location: [facility];  Service: Orthopedics;  Laterality: Right;   IR KYPHO LUMBAR INC FX REDUCE BONE BX UNI/BIL CANNULATION INC/IMAGING  03/21/2021   PACEMAKER INSERTION     REVISION TOTAL HIP ARTHROPLASTY Left 06/2014    Social History   Socioeconomic History   Marital status: Widowed    Spouse name: Not on file   Number of children: 2   Years of education: 70   Highest education level: Not on file  Occupational History    Comment: retired  Tobacco Use   Smoking status: Never    Passive exposure: Never   Smokeless tobacco: Never  Vaping Use   Vaping Use: Never used  Substance and Sexual Activity   Alcohol use: No    Alcohol/week: 0.0 standard drinks of alcohol   Drug use: No   Sexual activity: Never  Other Topics Concern   Not on file  Social History Narrative   09/21/21 resides at [place] alone, son lives beside her   Caffeine - none   Social Determinants of Health   Financial Resource Strain: Low Risk  (  05/13/2020)   Overall Financial Resource Strain (CARDIA)    Difficulty of Paying Living Expenses: Not hard at all  Food Insecurity: No Food Insecurity (05/13/2020)   Hunger Vital Sign    Worried About Running Out of Food in the Last Year: Never true    Ran Out of Food in the Last Year: Never true  Transportation Needs: No Transportation Needs (05/13/2020)   PRAPARE - Hydrologist (Medical): No    Lack of Transportation (Non-Medical): No  Physical Activity: Inactive (05/13/2020)   Exercise Vital Sign    Days of Exercise per Week: 0 days    Minutes of Exercise per Session: 0 min  Stress: No Stress Concern Present (05/13/2020)   [place]    Feeling of Stress : Not at all  Social Connections: Socially Isolated (05/13/2020)   Social Connection and Isolation Panel [NHANES]    Frequency of Communication with Friends and Family: More than three times a week    Frequency of Social Gatherings with Friends and Family: More than three times a week    Attends Religious Services: Never    Marine scientist or Organizations: No    Attends Archivist Meetings: Never    Marital Status: Widowed  Intimate Partner Violence: Not At Risk (05/13/2020)   Humiliation, Afraid, Rape, and Kick questionnaire    Fear of Current or Ex-Partner: No    Emotionally Abused: No    Physically Abused: No    Sexually Abused: No   Family History  Problem Relation Age of Onset   Alzheimer's disease Sister    Cardiomyopathy Brother       VITAL SIGNS BP 130/70   Pulse 74   Temp 97.8 F (36.6 C)   Resp 20   Ht '5\' 5"'$  (1.651 m)   Wt 113 lb 9.6 oz (51.5 kg)   SpO2 98%   BMI 18.90 kg/m   Outpatient Encounter Medications as of 02/27/2022  Medication Sig   denosumab (PROLIA) 60 MG/ML SOSY injection Inject 60 mg into the skin every 6 (six) months.   Vitamin D, Ergocalciferol, (DRISDOL) 1.25 MG (50000 UNIT) CAPS capsule Take 50,000 Units by mouth every 7 (seven) days.   Acetaminophen (TYLENOL ARTHRITIS PAIN PO) Take 650 mg by mouth every 6 (six) hours.   amiodarone (PACERONE) 200 MG tablet TAKE 1 TABLET(200 MG) BY MOUTH DAILY   aspirin 81 MG chewable tablet Chew 1 tablet (81 mg total) by mouth daily.   Balsam [place]-Castor Oil [facility]) OINT Special Instructions: apply to DTI Right & Left medial buttock Every Shift; Day   gabapentin (NEURONTIN) 100 MG capsule Take 100 mg by mouth at bedtime.   lidocaine 4 % Place 1 patch onto the skin. place on in the AM and remove at [facility] for right flank pain   magnesium hydroxide (MILK OF MAGNESIA) 400 MG/5ML suspension Take 30 mLs by mouth daily as needed for mild constipation.    methocarbamol (ROBAXIN) 500 MG tablet Take 500 mg by mouth every 8 (eight) hours as needed (back pain).   nitrofurantoin, macrocrystal-monohydrate, (MACROBID) 100 MG capsule Take 100 mg by mouth at bedtime. UTI prophylaxis   NON FORMULARY Diet - Dysphagia 3   ondansetron (ZOFRAN) 4 MG tablet Take 1 tablet (4 mg total) by mouth daily as needed for nausea or vomiting.   pantoprazole (PROTONIX) 40 MG tablet Take 40 mg by mouth daily.   senna (SENOKOT) 8.6  MG TABS tablet Take 1 tablet by mouth in the morning and at bedtime.   No facility-administered encounter medications on file as of 02/27/2022.     SIGNIFICANT DIAGNOSTIC EXAMS  PREVIOUS   08-17-21: ct head stroke:  1. No acute CT finding. Atrophy and chronic small-vessel ischemic changes. 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 2. ASPECTS is 10, allowing for the old infarction  08-17-21: ct angio of head:  1. Decreased perfusion (Tmax> 6 seconds) in the posterior left MCA territory, which correlates with an area with decreased contrast enhancement in the MCA branches. While there is mild to moderate narrowing in the distal left M1 segment, no additional focal stenosis or occlusion is seen in the left MCA branches. No infarct core. 2. Multifocal narrowing in the bilateral PCAs. No other hemodynamically significant intracranial stenosis. 3. Approximately 50% proximal left ICA stenosis, unchanged. No additional hemodynamically significant stenosis in the neck.  08-17-21: right hip x-ray: Acute displaced right femoral neck fracture.   08-18-21: MRI of brain 1. Moderately motion limited study with evidence of acute left frontal and parietal infarcts. The left parietal infarct is along the posterior aspect of a remote left parietal infarct. 2. Moderate chronic microvascular ischemic disease. 3.  Cerebral atrophy   01-04-22: dexa scan: t score -4.184   NO NEW EXAMS   LABS REVIEWED; PREVIOUS   08-17-21: wbc 19.6 hgb 14.3; hct 45.2 mcv 94.8 plt  209; glucose 144; bun 15; creat 1.22 k+ 3.8; na++ 137; ca 9.1; GFR 42; protein 6.7; albumin 3.8 hgb a1c 5.7 08-18-21: urine culture <10,000. Vitamin B 12: 232; tsh 1.864 08-21-21: vit D 12.78 08-23-21: vitamin B 12: 204; iron 22; tibc 185; ferritin 234;  08-24-21: wbc 9.2; hgb 9.1; hct 27.0; mcv 90.0 plt 154; glucose 104; bun 17; creat 0.76; k + 3.8; na++ 136; ca 8.1; GFR >60 09-30-21: tsh 4.221 10-18-21: urine culture: 40,000 colonies: citrobacter freundii   TODAY  01-16-22; glucose 62; bun 19; creat 0.91; k+ 4.6; na++ 139; ca 8.7; gfr 60; vitamin D 29.35    Review of Systems  Constitutional:  Negative for malaise/fatigue.  Respiratory:  Negative for cough and shortness of breath.   Cardiovascular:  Negative for chest pain, palpitations and leg swelling.  Gastrointestinal:  Negative for abdominal pain, constipation and heartburn.  Musculoskeletal:  Negative for back pain, joint pain and myalgias.  Skin: Negative.   Neurological:  Negative for dizziness.  Psychiatric/Behavioral:  The patient is not nervous/anxious.   Physical Exam Constitutional:      General: She is not in acute distress.    Appearance: She is well-developed. She is not diaphoretic.  Neck:     Thyroid: No thyromegaly.  Cardiovascular:     Rate and Rhythm: Normal rate and regular rhythm.     Pulses: Normal pulses.     Heart sounds: Normal heart sounds.     Comments: Pace maker  Pulmonary:     Effort: Pulmonary effort is normal. No respiratory distress.     Breath sounds: Normal breath sounds.  Abdominal:     General: Bowel sounds are normal. There is no distension.     Palpations: Abdomen is soft.     Tenderness: There is no abdominal tenderness.  Musculoskeletal:     Cervical back: Neck supple.     Right lower leg: No edema.     Left lower leg: No edema.     Comments:  Right hip hemiarthroplasty Is able to move all extremities  Lymphadenopathy:     Cervical: No cervical adenopathy.  Skin:    General: Skin  is warm and dry.  Neurological:     Mental Status: She is alert. Mental status is at baseline.  Psychiatric:        Mood and Affect: Mood normal.       ASSESSMENT/ PLAN:  TODAY  History of CVA (cerebral vascular accident) will continue asa 81 mg daily   2. Atrial fibrillation/sick sinus syndrome; will continue amiodarone 200 mg daily lopressor 37.5 mg twice daily is on xarelto 15 mg daily   3. Primary hypertension: b/p 130/70 will continue lopressor 37.5 mg twice daily    PREVIOUS   4. Gastroesophageal reflux disease without esophagitis: will continue protonix 40 mg daily   5.  Closed displaced fracture of right femoral neck: is stable will continue to monitor her status.   6. Chronic kidney disease stage 3b: bun 19; creat 0.91; gfr 60  7. Acute post operative anemia due to expected blood loss: hgb 9.1 will monitor   8. Mixed hyperlipidemia: is off lipitor   9. Vitamin D deficiency: level: 29.35 will continue vitamin D 50,000 unit weekly   10. Vitamin B 12 deficiency: level 204 will continue 1,000 mcg daily   11. Aortic atherosclerosis (ct 08/02/21) will continue asa 81 mg daily; off statin due to advanced age.   12. Post menopausal osteoporosis t score -4.184 will continue prolia every 6 months    Ok Edwards NP [facility]   call [phone]

## 2022-03-10 ENCOUNTER — Encounter: Payer: Self-pay | Admitting: Adult Health

## 2022-03-10 ENCOUNTER — Non-Acute Institutional Stay (SKILLED_NURSING_FACILITY): Payer: Medicare Other | Admitting: Adult Health

## 2022-03-10 DIAGNOSIS — N1832 Chronic kidney disease, stage 3b: Secondary | ICD-10-CM

## 2022-03-10 DIAGNOSIS — I482 Chronic atrial fibrillation, unspecified: Secondary | ICD-10-CM

## 2022-03-10 DIAGNOSIS — I700 Atherosclerosis of aorta: Secondary | ICD-10-CM

## 2022-03-10 NOTE — Progress Notes (Unsigned)
Location:  [facility] Room Number: NO/155/P Place of Service:  SNF (31)   CODE STATUS: DNR  Allergies  Allergen Reactions   Septra Ds [Sulfamethoxazole-Trimethoprim]    Lisinopril Other (See Comments)    Cough   Chief Complaint  Patient presents with   Acute Visit    Care plan meeting      HPI:  We have come together for her care plan meeting. BIMS 10/15 mood 6/30: trouble concentrating; poor energy; some depression. She is nonambulatory with no falls. She requires moderate assist with her adl care. She is incontinent of bladder and frequently incontinent of bowel. Dietary: weight is 113.6 pounds is on D3 diet appetite 50-75% of meals; requires set up for feeding. Therapy: none at this time. Activities: does participate. She continues to be followed for her chronic illnesses including: Aortic atherosclerosis Chronic kidney disease stage 3b  Atrial fibrillation chronic  Past Medical History:  Diagnosis Date   Acute metabolic encephalopathy 76/7341   Atrial fibrillation ([facility])    Bleeding stomach ulcer 1980s?   CVA (cerebral vascular accident) ([place]) 07/2021   DIVERTICULOSIS, COLON 11/22/2006   Headache(784.0)    "very often; not regular" (11/25/2015)   Heart block    hx of   Hip fracture ([place]) 07/2021   History of blood transfusion 1980s   "when I had the bleeding ulcer"   HYPERLIPIDEMIA 11/22/2006   Hypertension    Melanoma ([place])    LLE   Menopausal syndrome (hot flashes)    OSTEOPOROSIS 11/22/2006   Presence of permanent cardiac pacemaker    Vitamin D deficiency 08/21/2021    Past Surgical History:  Procedure Laterality Date   APPENDECTOMY     BUNIONECTOMY Right    CATARACT EXTRACTION W/ INTRAOCULAR LENS  IMPLANT, BILATERAL Bilateral 2000s   CHOLECYSTECTOMY OPEN     EP IMPLANTABLE DEVICE N/A 11/25/2015   Procedure: Pacemaker Implant;  Surgeon: Will Meredith Leeds, MD;  Location: [facility];  Service: Cardiovascular;  Laterality: N/A;    FEMUR IM NAIL Left 07/08/2014   Procedure: INTRAMEDULLARY (IM) NAIL ;  Surgeon: Meredith Pel, MD;  [facility]: [facility];  Service: Orthopedics;  Laterality: Left;   FRACTURE SURGERY     HIP ARTHROPLASTY Right 08/20/2021   Procedure: ARTHROPLASTY BIPOLAR HIP (HEMIARTHROPLASTY);  Surgeon: Altamese Harrison, MD;  Location: [facility];  Service: Orthopedics;  Laterality: Right;   IR KYPHO LUMBAR INC FX REDUCE BONE BX UNI/BIL CANNULATION INC/IMAGING  03/21/2021   PACEMAKER INSERTION     REVISION TOTAL HIP ARTHROPLASTY Left 06/2014    Social History   Socioeconomic History   Marital status: Widowed    Spouse name: Not on file   Number of children: 2   Years of education: 94   Highest education level: Not on file  Occupational History    Comment: retired  Tobacco Use   Smoking status: Never    Passive exposure: Never   Smokeless tobacco: Never  Vaping Use   Vaping Use: Never used  Substance and Sexual Activity   Alcohol use: No    Alcohol/week: 0.0 standard drinks of alcohol   Drug use: No   Sexual activity: Never  Other Topics Concern   Not on file  Social History Narrative   09/21/21 resides at [place] alone, son lives beside her   Caffeine - none   Social Determinants of Health   Financial Resource Strain: Low Risk  (05/13/2020)   Overall  Financial Resource Strain (CARDIA)    Difficulty of Paying Living Expenses: Not hard at all  Food Insecurity: No Food Insecurity (05/13/2020)   Hunger Vital Sign    Worried About Running Out of Food in the Last Year: Never true    Ran Out of Food in the Last Year: Never true  Transportation Needs: No Transportation Needs (05/13/2020)   PRAPARE - Hydrologist (Medical): No    Lack of Transportation (Non-Medical): No  Physical Activity: Inactive (05/13/2020)   Exercise Vital Sign    Days of Exercise per Week: 0 days    Minutes of Exercise per Session: 0 min  Stress: No Stress Concern Present  (05/13/2020)   [place]    Feeling of Stress : Not at all  Social Connections: Socially Isolated (05/13/2020)   Social Connection and Isolation Panel [NHANES]    Frequency of Communication with Friends and Family: More than three times a week    Frequency of Social Gatherings with Friends and Family: More than three times a week    Attends Religious Services: Never    Marine scientist or Organizations: No    Attends Archivist Meetings: Never    Marital Status: Widowed  Intimate Partner Violence: Not At Risk (05/13/2020)   Humiliation, Afraid, Rape, and Kick questionnaire    Fear of Current or Ex-Partner: No    Emotionally Abused: No    Physically Abused: No    Sexually Abused: No   Family History  Problem Relation Age of Onset   Alzheimer's disease Sister    Cardiomyopathy Brother       VITAL SIGNS BP 134/70   Pulse 66   Temp 97.8 F (36.6 C)   Resp 16   Ht '5\' 5"'$  (1.651 m)   Wt 113 lb 9.6 oz (51.5 kg)   SpO2 96%   BMI 18.90 kg/m   Outpatient Encounter Medications as of 03/10/2022  Medication Sig   Acetaminophen (TYLENOL ARTHRITIS PAIN PO) Take 650 mg by mouth every 6 (six) hours.   amiodarone (PACERONE) 200 MG tablet TAKE 1 TABLET(200 MG) BY MOUTH DAILY   aspirin 81 MG chewable tablet Chew 1 tablet (81 mg total) by mouth daily.   Balsam [place]-Castor Oil [facility]) OINT Special Instructions: apply to DTI Right & Left medial buttock Every Shift; Day   denosumab (PROLIA) 60 MG/ML SOSY injection Inject 60 mg into the skin every 6 (six) months.   gabapentin (NEURONTIN) 100 MG capsule Take 100 mg by mouth at bedtime.   lidocaine 4 % Place 1 patch onto the skin. place on in the AM and remove at [facility] for right flank pain   magnesium hydroxide (MILK OF MAGNESIA) 400 MG/5ML suspension Take 30 mLs by mouth daily as needed for mild constipation.   methocarbamol (ROBAXIN) 500 MG tablet Take 500 mg by  mouth every 8 (eight) hours as needed (back pain).   nitrofurantoin, macrocrystal-monohydrate, (MACROBID) 100 MG capsule Take 100 mg by mouth at bedtime. UTI prophylaxis   NON FORMULARY Diet - Dysphagia 3   ondansetron (ZOFRAN) 4 MG tablet Take 1 tablet (4 mg total) by mouth daily as needed for nausea or vomiting.   pantoprazole (PROTONIX) 40 MG tablet Take 40 mg by mouth daily.   senna (SENOKOT) 8.6 MG TABS tablet Take 1 tablet by mouth in the morning and at bedtime.   Vitamin D, Ergocalciferol, (DRISDOL) 1.25 MG (50000 UNIT) CAPS  capsule Take 50,000 Units by mouth every 7 (seven) days.   No facility-administered encounter medications on file as of 03/10/2022.     SIGNIFICANT DIAGNOSTIC EXAMS  PREVIOUS   08-17-21: ct head stroke:  1. No acute CT finding. Atrophy and chronic small-vessel ischemic changes. 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 2. ASPECTS is 10, allowing for the old infarction  08-17-21: ct angio of head:  1. Decreased perfusion (Tmax> 6 seconds) in the posterior left MCA territory, which correlates with an area with decreased contrast enhancement in the MCA branches. While there is mild to moderate narrowing in the distal left M1 segment, no additional focal stenosis or occlusion is seen in the left MCA branches. No infarct core. 2. Multifocal narrowing in the bilateral PCAs. No other hemodynamically significant intracranial stenosis. 3. Approximately 50% proximal left ICA stenosis, unchanged. No additional hemodynamically significant stenosis in the neck.  08-17-21: right hip x-ray: Acute displaced right femoral neck fracture.   08-18-21: MRI of brain 1. Moderately motion limited study with evidence of acute left frontal and parietal infarcts. The left parietal infarct is along the posterior aspect of a remote left parietal infarct. 2. Moderate chronic microvascular ischemic disease. 3.  Cerebral atrophy   01-04-22: dexa scan: t score -4.184   NO NEW EXAMS   LABS  REVIEWED; PREVIOUS   08-17-21: wbc 19.6 hgb 14.3; hct 45.2 mcv 94.8 plt 209; glucose 144; bun 15; creat 1.22 k+ 3.8; na++ 137; ca 9.1; GFR 42; protein 6.7; albumin 3.8 hgb a1c 5.7 08-18-21: urine culture <10,000. Vitamin B 12: 232; tsh 1.864 08-21-21: vit D 12.78 08-23-21: vitamin B 12: 204; iron 22; tibc 185; ferritin 234;  08-24-21: wbc 9.2; hgb 9.1; hct 27.0; mcv 90.0 plt 154; glucose 104; bun 17; creat 0.76; k + 3.8; na++ 136; ca 8.1; GFR >60 09-30-21: tsh 4.221 10-18-21: urine culture: 40,000 colonies: citrobacter freundii  01-16-22; glucose 62; bun 19; creat 0.91; k+ 4.6; na++ 139; ca 8.7; gfr 60; vitamin D 29.35   NO NEW LABS.    Review of Systems  Constitutional:  Negative for malaise/fatigue.  Respiratory:  Negative for cough and shortness of breath.   Cardiovascular:  Negative for chest pain, palpitations and leg swelling.  Gastrointestinal:  Negative for abdominal pain, constipation and heartburn.  Musculoskeletal:  Negative for back pain, joint pain and myalgias.  Skin: Negative.   Neurological:  Negative for dizziness.  Psychiatric/Behavioral:  The patient is not nervous/anxious.     Physical Exam Constitutional:      General: She is not in acute distress.    Appearance: She is underweight. She is not diaphoretic.  Neck:     Thyroid: No thyromegaly.  Cardiovascular:     Rate and Rhythm: Normal rate and regular rhythm.     Pulses: Normal pulses.     Heart sounds: Normal heart sounds.     Comments: pacemaker Pulmonary:     Effort: Pulmonary effort is normal. No respiratory distress.     Breath sounds: Normal breath sounds.  Abdominal:     General: Bowel sounds are normal. There is no distension.     Palpations: Abdomen is soft.     Tenderness: There is no abdominal tenderness.  Musculoskeletal:     Cervical back: Neck supple.     Right lower leg: No edema.     Left lower leg: No edema.     Comments: Right hip hemiarthroplasty Is able to move all extremities  Lymphadenopathy:     Cervical: No cervical adenopathy.  Skin:    General: Skin is warm and dry.  Neurological:     Mental Status: She is alert. Mental status is at baseline.  Psychiatric:        Mood and Affect: Mood normal.     ASSESSMENT/ PLAN:  TODAY  Aortic atherosclerosis Chronic kidney disease stage 3b Atrial fibrillation chronic   Will stop the following medications per family request: calcium; gabapentin; lidoderm; robaxin; prolia; protonix; senna; vitamin d Will continue current plan of care Will continue to monitor her status   Time spent with patient: 40 minutes: medications; plan of care; dietary       Ok Edwards NP [facility]   call [phone]

## 2022-03-23 ENCOUNTER — Non-Acute Institutional Stay (SKILLED_NURSING_FACILITY): Payer: Medicare Other | Admitting: Adult Health

## 2022-03-23 ENCOUNTER — Encounter: Payer: Self-pay | Admitting: Adult Health

## 2022-03-23 DIAGNOSIS — I4891 Unspecified atrial fibrillation: Secondary | ICD-10-CM

## 2022-03-23 DIAGNOSIS — I700 Atherosclerosis of aorta: Secondary | ICD-10-CM | POA: Diagnosis not present

## 2022-03-23 DIAGNOSIS — N1832 Chronic kidney disease, stage 3b: Secondary | ICD-10-CM

## 2022-03-23 NOTE — Progress Notes (Signed)
Location:  [facility] Room Number: 155 Place of Service:  SNF [phone])  Provider: Ok Edwards np   PCP: Gerlene Fee, NP Patient Care Team: Gerlene Fee, NP as PCP - General (Geriatric Medicine) Josue Hector, MD as PCP - Cardiology (Cardiology) Constance Haw, MD as PCP - Electrophysiology (Cardiology)  Extended Emergency Contact Information Primary Emergency Contact: Delancy,Donald Address: [place] [address] [address]nette Litter of [place] Phone: [phone] Relation: Son Secondary Emergency Contact: [place] Mobile Phone: [phone] Relation: Son  Code Status: dnr  Goals of care:  Advanced Directive information    11/24/2021   11:14 AM  Advanced Directives  Does Patient Have a Medical Advance Directive? Yes  Type of Paramedic of [place];Out of facility DNR (pink MOST or yellow form)  Does patient want to make changes to medical advance directive? No - Patient declined  Copy of [place] in Chart? Yes - validated most recent copy scanned in chart (See row information)  Pre-existing out of facility DNR order (yellow form or pink MOST form) Pink MOST form placed in chart (order not valid for inpatient use)     Allergies  Allergen Reactions   Septra Ds [Sulfamethoxazole-Trimethoprim]    Lisinopril Other (See Comments)    Cough    Chief Complaint  Patient presents with   Discharge Note    HPI:  88 y.o. female  being discharged to another SNF. She will not need any dme; or medications; both will be provided by the receiving SNF. She will be followed up by the medical provider at the other facility. She had been hospitalized in April of this year status post cva. She was admitted to this facility for short term rehab; which converted to long term placement.    Past Medical History:  Diagnosis Date   Acute metabolic  encephalopathy 63/8466   Atrial fibrillation ([place])    Bleeding stomach ulcer 1980s?   CVA (cerebral vascular accident) ([place]) 07/2021   DIVERTICULOSIS, COLON 11/22/2006   Headache(784.0)    "very often; not regular" (11/25/2015)   Heart block    hx of   Hip fracture ([place]) 07/2021   History of blood transfusion 1980s   "when I had the bleeding ulcer"   HYPERLIPIDEMIA 11/22/2006   Hypertension    Melanoma ([place])    LLE   Menopausal syndrome (hot flashes)    OSTEOPOROSIS 11/22/2006   Presence of permanent cardiac pacemaker    Vitamin D deficiency 08/21/2021    Past Surgical History:  Procedure Laterality Date   APPENDECTOMY     BUNIONECTOMY Right    CATARACT EXTRACTION W/ INTRAOCULAR LENS  IMPLANT, BILATERAL Bilateral 2000s   CHOLECYSTECTOMY OPEN     EP IMPLANTABLE DEVICE N/A 11/25/2015   Procedure: Pacemaker Implant;  Surgeon: Will Meredith Leeds, MD;  Location: [facility];  Service: Cardiovascular;  Laterality: N/A;   FEMUR IM NAIL Left 07/08/2014   Procedure: INTRAMEDULLARY (IM) NAIL ;  Surgeon: Meredith Pel, MD;  Location: [facility];  Service: Orthopedics;  Laterality: Left;   FRACTURE SURGERY     HIP ARTHROPLASTY Right 08/20/2021   Procedure: ARTHROPLASTY BIPOLAR HIP (HEMIARTHROPLASTY);  Surgeon: Altamese Oscoda, MD;  Location: [facility];  Service: Orthopedics;  Laterality: Right;   IR KYPHO LUMBAR INC FX REDUCE BONE BX UNI/BIL CANNULATION INC/IMAGING  03/21/2021   PACEMAKER  INSERTION     REVISION TOTAL HIP ARTHROPLASTY Left 06/2014      reports that she has never smoked. She has never been exposed to tobacco smoke. She has never used smokeless tobacco. She reports that she does not drink alcohol and does not use drugs. Social History   Socioeconomic History   Marital status: Widowed    Spouse name: Not on file   Number of children: 2   Years of education: 109   Highest education level: Not on file  Occupational History    Comment: retired  Tobacco Use   Smoking  status: Never    Passive exposure: Never   Smokeless tobacco: Never  Vaping Use   Vaping Use: Never used  Substance and Sexual Activity   Alcohol use: No    Alcohol/week: 0.0 standard drinks of alcohol   Drug use: No   Sexual activity: Never  Other Topics Concern   Not on file  Social History Narrative   09/21/21 resides at [place] alone, son lives beside her   Caffeine - none   Social Determinants of Health   Financial Resource Strain: Low Risk  (05/13/2020)   Overall Financial Resource Strain (CARDIA)    Difficulty of Paying Living Expenses: Not hard at all  Food Insecurity: No Food Insecurity (05/13/2020)   Hunger Vital Sign    Worried About Running Out of Food in the Last Year: Never true    [place] in the Last Year: Never true  Transportation Needs: No Transportation Needs (05/13/2020)   PRAPARE - Hydrologist (Medical): No    Lack of Transportation (Non-Medical): No  Physical Activity: Inactive (05/13/2020)   Exercise Vital Sign    Days of Exercise per Week: 0 days    Minutes of Exercise per Session: 0 min  Stress: No Stress Concern Present (05/13/2020)   [place]    Feeling of Stress : Not at all  Social Connections: Socially Isolated (05/13/2020)   Social Connection and Isolation Panel [NHANES]    Frequency of Communication with Friends and Family: More than three times a week    Frequency of Social Gatherings with Friends and Family: More than three times a week    Attends Religious Services: Never    Marine scientist or Organizations: No    Attends Archivist Meetings: Never    Marital Status: Widowed  Intimate Partner Violence: Not At Risk (05/13/2020)   Humiliation, Afraid, Rape, and Kick questionnaire    Fear of Current or Ex-Partner: No    Emotionally Abused: No    Physically Abused: No    Sexually Abused: No   Functional  Status Survey:    Allergies  Allergen Reactions   Septra Ds [Sulfamethoxazole-Trimethoprim]    Lisinopril Other (See Comments)    Cough    Pertinent  Health Maintenance Due  Topic Date Due   INFLUENZA VACCINE  12/20/2021   DEXA SCAN  Completed   MAMMOGRAM  Discontinued    Medications: Outpatient Encounter Medications as of 03/23/2022  Medication Sig   Acetaminophen (TYLENOL ARTHRITIS PAIN PO) Take 650 mg by mouth every 6 (six) hours.   amiodarone (PACERONE) 200 MG tablet TAKE 1 TABLET(200 MG) BY MOUTH DAILY   aspirin 81 MG chewable tablet Chew 1 tablet (81 mg total) by mouth daily.   Balsam [place]-Castor Oil [facility]) OINT Special Instructions: apply to  DTI Right & Left medial buttock Every Shift; Day   denosumab (PROLIA) 60 MG/ML SOSY injection Inject 60 mg into the skin every 6 (six) months.   gabapentin (NEURONTIN) 100 MG capsule Take 100 mg by mouth at bedtime.   lidocaine 4 % Place 1 patch onto the skin. place on in the AM and remove at [facility] for right flank pain   magnesium hydroxide (MILK OF MAGNESIA) 400 MG/5ML suspension Take 30 mLs by mouth daily as needed for mild constipation.   methocarbamol (ROBAXIN) 500 MG tablet Take 500 mg by mouth every 8 (eight) hours as needed (back pain).   nitrofurantoin, macrocrystal-monohydrate, (MACROBID) 100 MG capsule Take 100 mg by mouth at bedtime. UTI prophylaxis   NON FORMULARY Diet - Dysphagia 3   ondansetron (ZOFRAN) 4 MG tablet Take 1 tablet (4 mg total) by mouth daily as needed for nausea or vomiting.   pantoprazole (PROTONIX) 40 MG tablet Take 40 mg by mouth daily.   senna (SENOKOT) 8.6 MG TABS tablet Take 1 tablet by mouth in the morning and at bedtime.   Vitamin D, Ergocalciferol, (DRISDOL) 1.25 MG (50000 UNIT) CAPS capsule Take 50,000 Units by mouth every 7 (seven) days.   No facility-administered encounter medications on file as of 03/23/2022.      Vitals:   03/23/22 1419  BP: 120/64  Pulse: 80  Resp: 20  Temp: (!) 97.3  F (36.3 C)  SpO2: 97%  Weight: 114 lb 3.2 oz (51.8 kg)  Height: '5\' 5"'$  (1.651 m)   Body mass index is 19 kg/m.   SIGNIFICANT DIAGNOSTIC EXAMS  PREVIOUS   08-17-21: ct head stroke:  1. No acute CT finding. Atrophy and chronic small-vessel ischemic changes. 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 2. ASPECTS is 10, allowing for the old infarction  08-17-21: ct angio of head:  1. Decreased perfusion (Tmax> 6 seconds) in the posterior left MCA territory, which correlates with an area with decreased contrast enhancement in the MCA branches. While there is mild to moderate narrowing in the distal left M1 segment, no additional focal stenosis or occlusion is seen in the left MCA branches. No infarct core. 2. Multifocal narrowing in the bilateral PCAs. No other hemodynamically significant intracranial stenosis. 3. Approximately 50% proximal left ICA stenosis, unchanged. No additional hemodynamically significant stenosis in the neck.  08-17-21: right hip x-ray: Acute displaced right femoral neck fracture.   08-18-21: MRI of brain 1. Moderately motion limited study with evidence of acute left frontal and parietal infarcts. The left parietal infarct is along the posterior aspect of a remote left parietal infarct. 2. Moderate chronic microvascular ischemic disease. 3.  Cerebral atrophy   01-04-22: dexa scan: t score -4.184   NO NEW EXAMS   LABS REVIEWED; PREVIOUS   08-17-21: wbc 19.6 hgb 14.3; hct 45.2 mcv 94.8 plt 209; glucose 144; bun 15; creat 1.22 k+ 3.8; na++ 137; ca 9.1; GFR 42; protein 6.7; albumin 3.8 hgb a1c 5.7 08-18-21: urine culture <10,000. Vitamin B 12: 232; tsh 1.864 08-21-21: vit D 12.78 08-23-21: vitamin B 12: 204; iron 22; tibc 185; ferritin 234;  08-24-21: wbc 9.2; hgb 9.1; hct 27.0; mcv 90.0 plt 154; glucose 104; bun 17; creat 0.76; k + 3.8; na++ 136; ca 8.1; GFR >60 09-30-21: tsh 4.221 10-18-21: urine culture: 40,000 colonies: citrobacter freundii  01-16-22; glucose 62; bun 19;  creat 0.91; k+ 4.6; na++ 139; ca 8.7; gfr 60; vitamin D 29.35   NO NEW LABS.    Review of Systems  Constitutional:  Negative for malaise/fatigue.  Respiratory:  Negative for cough and shortness of breath.   Cardiovascular:  Negative for chest pain, palpitations and leg swelling.  Gastrointestinal:  Negative for abdominal pain, constipation and heartburn.  Musculoskeletal:  Negative for back pain, joint pain and myalgias.  Skin: Negative.   Neurological:  Negative for dizziness.  Psychiatric/Behavioral:  The patient is not nervous/anxious.    Physical Exam Constitutional:      General: She is not in acute distress.    Appearance: She is well-developed. She is not diaphoretic.  Neck:     Thyroid: No thyromegaly.  Cardiovascular:     Rate and Rhythm: Normal rate and regular rhythm.     Pulses: Normal pulses.     Heart sounds: Normal heart sounds.     Comments: pacemaker Pulmonary:     Effort: Pulmonary effort is normal. No respiratory distress.     Breath sounds: Normal breath sounds.  Abdominal:     General: Bowel sounds are normal. There is no distension.     Palpations: Abdomen is soft.     Tenderness: There is no abdominal tenderness.  Musculoskeletal:     Cervical back: Neck supple.     Right lower leg: No edema.     Left lower leg: No edema.     Comments:  Right hip hemiarthroplasty Is able to move all extremities     Lymphadenopathy:     Cervical: No cervical adenopathy.  Skin:    General: Skin is warm and dry.  Neurological:     Mental Status: She is alert. Mental status is at baseline.  Psychiatric:        Mood and Affect: Mood normal.       Assessment/Plan:    Patient is being discharged with the following home health services:  none needed   Patient is being discharged with the following durable medical equipment:  none needed   Patient has been advised to f/u with their PCP in 1-2 weeks to for a transitions of care visit.  Social services at their  facility was responsible for arranging this appointment.  Pt was provided with adequate prescriptions of noncontrolled medications to reach the scheduled appointment .  For controlled substances, a limited supply was provided as appropriate for the individual patient.  If the pt normally receives these medications from a pain clinic or has a contract with another physician, these medications should be received from that clinic or physician only).    Her medications will be provided by SNF   Ok Edwards NP [facility] Adult [facility]   call [phone]

## 2022-03-30 DIAGNOSIS — N1832 Chronic kidney disease, stage 3b: Secondary | ICD-10-CM | POA: Diagnosis not present

## 2022-03-30 DIAGNOSIS — R1312 Dysphagia, oropharyngeal phase: Secondary | ICD-10-CM | POA: Diagnosis not present

## 2022-03-30 DIAGNOSIS — I480 Paroxysmal atrial fibrillation: Secondary | ICD-10-CM | POA: Diagnosis not present

## 2022-03-30 DIAGNOSIS — I639 Cerebral infarction, unspecified: Secondary | ICD-10-CM | POA: Diagnosis not present

## 2022-03-30 DIAGNOSIS — M6281 Muscle weakness (generalized): Secondary | ICD-10-CM | POA: Diagnosis not present

## 2022-03-30 DIAGNOSIS — R2689 Other abnormalities of gait and mobility: Secondary | ICD-10-CM | POA: Diagnosis not present

## 2022-03-30 DIAGNOSIS — N390 Urinary tract infection, site not specified: Secondary | ICD-10-CM | POA: Diagnosis not present

## 2022-03-30 DIAGNOSIS — Z8673 Personal history of transient ischemic attack (TIA), and cerebral infarction without residual deficits: Secondary | ICD-10-CM | POA: Diagnosis not present

## 2022-03-30 DIAGNOSIS — I495 Sick sinus syndrome: Secondary | ICD-10-CM | POA: Diagnosis not present

## 2022-03-30 DIAGNOSIS — N1831 Chronic kidney disease, stage 3a: Secondary | ICD-10-CM | POA: Diagnosis not present

## 2022-03-30 DIAGNOSIS — I10 Essential (primary) hypertension: Secondary | ICD-10-CM | POA: Diagnosis not present

## 2022-04-02 DIAGNOSIS — N1831 Chronic kidney disease, stage 3a: Secondary | ICD-10-CM | POA: Diagnosis not present

## 2022-04-02 DIAGNOSIS — I639 Cerebral infarction, unspecified: Secondary | ICD-10-CM | POA: Diagnosis not present

## 2022-04-02 DIAGNOSIS — R2689 Other abnormalities of gait and mobility: Secondary | ICD-10-CM | POA: Diagnosis not present

## 2022-04-02 DIAGNOSIS — I480 Paroxysmal atrial fibrillation: Secondary | ICD-10-CM | POA: Diagnosis not present

## 2022-04-02 DIAGNOSIS — M6281 Muscle weakness (generalized): Secondary | ICD-10-CM | POA: Diagnosis not present

## 2022-04-02 DIAGNOSIS — R1312 Dysphagia, oropharyngeal phase: Secondary | ICD-10-CM | POA: Diagnosis not present

## 2022-04-03 DIAGNOSIS — I639 Cerebral infarction, unspecified: Secondary | ICD-10-CM | POA: Diagnosis not present

## 2022-04-03 DIAGNOSIS — R1312 Dysphagia, oropharyngeal phase: Secondary | ICD-10-CM | POA: Diagnosis not present

## 2022-04-03 DIAGNOSIS — M6281 Muscle weakness (generalized): Secondary | ICD-10-CM | POA: Diagnosis not present

## 2022-04-03 DIAGNOSIS — N1831 Chronic kidney disease, stage 3a: Secondary | ICD-10-CM | POA: Diagnosis not present

## 2022-04-03 DIAGNOSIS — I10 Essential (primary) hypertension: Secondary | ICD-10-CM | POA: Diagnosis not present

## 2022-04-03 DIAGNOSIS — I480 Paroxysmal atrial fibrillation: Secondary | ICD-10-CM | POA: Diagnosis not present

## 2022-04-03 DIAGNOSIS — Z8673 Personal history of transient ischemic attack (TIA), and cerebral infarction without residual deficits: Secondary | ICD-10-CM | POA: Diagnosis not present

## 2022-04-03 DIAGNOSIS — R2689 Other abnormalities of gait and mobility: Secondary | ICD-10-CM | POA: Diagnosis not present

## 2022-04-03 DIAGNOSIS — I495 Sick sinus syndrome: Secondary | ICD-10-CM | POA: Diagnosis not present

## 2022-04-03 DIAGNOSIS — N1832 Chronic kidney disease, stage 3b: Secondary | ICD-10-CM | POA: Diagnosis not present

## 2022-04-03 DIAGNOSIS — N390 Urinary tract infection, site not specified: Secondary | ICD-10-CM | POA: Diagnosis not present

## 2022-04-04 DIAGNOSIS — I10 Essential (primary) hypertension: Secondary | ICD-10-CM | POA: Diagnosis not present

## 2022-04-04 DIAGNOSIS — M6281 Muscle weakness (generalized): Secondary | ICD-10-CM | POA: Diagnosis not present

## 2022-04-04 DIAGNOSIS — N1831 Chronic kidney disease, stage 3a: Secondary | ICD-10-CM | POA: Diagnosis not present

## 2022-04-04 DIAGNOSIS — I639 Cerebral infarction, unspecified: Secondary | ICD-10-CM | POA: Diagnosis not present

## 2022-04-04 DIAGNOSIS — I480 Paroxysmal atrial fibrillation: Secondary | ICD-10-CM | POA: Diagnosis not present

## 2022-04-04 DIAGNOSIS — R2689 Other abnormalities of gait and mobility: Secondary | ICD-10-CM | POA: Diagnosis not present

## 2022-04-04 DIAGNOSIS — I495 Sick sinus syndrome: Secondary | ICD-10-CM | POA: Diagnosis not present

## 2022-04-04 DIAGNOSIS — N390 Urinary tract infection, site not specified: Secondary | ICD-10-CM | POA: Diagnosis not present

## 2022-04-04 DIAGNOSIS — I69898 Other sequelae of other cerebrovascular disease: Secondary | ICD-10-CM | POA: Diagnosis not present

## 2022-04-04 DIAGNOSIS — N1832 Chronic kidney disease, stage 3b: Secondary | ICD-10-CM | POA: Diagnosis not present

## 2022-04-04 DIAGNOSIS — R1312 Dysphagia, oropharyngeal phase: Secondary | ICD-10-CM | POA: Diagnosis not present

## 2022-04-05 DIAGNOSIS — I639 Cerebral infarction, unspecified: Secondary | ICD-10-CM | POA: Diagnosis not present

## 2022-04-05 DIAGNOSIS — N1831 Chronic kidney disease, stage 3a: Secondary | ICD-10-CM | POA: Diagnosis not present

## 2022-04-05 DIAGNOSIS — I480 Paroxysmal atrial fibrillation: Secondary | ICD-10-CM | POA: Diagnosis not present

## 2022-04-05 DIAGNOSIS — M6281 Muscle weakness (generalized): Secondary | ICD-10-CM | POA: Diagnosis not present

## 2022-04-05 DIAGNOSIS — R1312 Dysphagia, oropharyngeal phase: Secondary | ICD-10-CM | POA: Diagnosis not present

## 2022-04-05 DIAGNOSIS — R2689 Other abnormalities of gait and mobility: Secondary | ICD-10-CM | POA: Diagnosis not present

## 2022-04-06 DIAGNOSIS — I480 Paroxysmal atrial fibrillation: Secondary | ICD-10-CM | POA: Diagnosis not present

## 2022-04-06 DIAGNOSIS — R2689 Other abnormalities of gait and mobility: Secondary | ICD-10-CM | POA: Diagnosis not present

## 2022-04-06 DIAGNOSIS — N1831 Chronic kidney disease, stage 3a: Secondary | ICD-10-CM | POA: Diagnosis not present

## 2022-04-06 DIAGNOSIS — I639 Cerebral infarction, unspecified: Secondary | ICD-10-CM | POA: Diagnosis not present

## 2022-04-06 DIAGNOSIS — K5900 Constipation, unspecified: Secondary | ICD-10-CM | POA: Diagnosis not present

## 2022-04-06 DIAGNOSIS — M6281 Muscle weakness (generalized): Secondary | ICD-10-CM | POA: Diagnosis not present

## 2022-04-06 DIAGNOSIS — I10 Essential (primary) hypertension: Secondary | ICD-10-CM | POA: Diagnosis not present

## 2022-04-06 DIAGNOSIS — R1312 Dysphagia, oropharyngeal phase: Secondary | ICD-10-CM | POA: Diagnosis not present

## 2022-04-07 ENCOUNTER — Other Ambulatory Visit: Payer: Self-pay | Admitting: *Deleted

## 2022-04-07 NOTE — Patient Outreach (Signed)
[place] Coordinator follow up. [name] Small resides in [facility]. Screening for potential [facility] care coordination services as benefit of insurance plan and PCP.  Update received from [place], SNF social worker. [name] Small is private pay and will transition to LTC.   No identifiable [facility] care coordination needs at this time.    Marthenia Rolling, MSN, RN,BSN [facility] Acute Care Coordinator [phone] (Direct dial)

## 2022-04-09 DIAGNOSIS — I639 Cerebral infarction, unspecified: Secondary | ICD-10-CM | POA: Diagnosis not present

## 2022-04-09 DIAGNOSIS — M6281 Muscle weakness (generalized): Secondary | ICD-10-CM | POA: Diagnosis not present

## 2022-04-09 DIAGNOSIS — R2689 Other abnormalities of gait and mobility: Secondary | ICD-10-CM | POA: Diagnosis not present

## 2022-04-09 DIAGNOSIS — I480 Paroxysmal atrial fibrillation: Secondary | ICD-10-CM | POA: Diagnosis not present

## 2022-04-09 DIAGNOSIS — R1312 Dysphagia, oropharyngeal phase: Secondary | ICD-10-CM | POA: Diagnosis not present

## 2022-04-09 DIAGNOSIS — N1831 Chronic kidney disease, stage 3a: Secondary | ICD-10-CM | POA: Diagnosis not present

## 2022-04-10 ENCOUNTER — Other Ambulatory Visit: Payer: Self-pay | Admitting: Cardiovascular Disease

## 2022-04-10 DIAGNOSIS — I495 Sick sinus syndrome: Secondary | ICD-10-CM | POA: Diagnosis not present

## 2022-04-10 DIAGNOSIS — M6281 Muscle weakness (generalized): Secondary | ICD-10-CM | POA: Diagnosis not present

## 2022-04-10 DIAGNOSIS — R2689 Other abnormalities of gait and mobility: Secondary | ICD-10-CM | POA: Diagnosis not present

## 2022-04-10 DIAGNOSIS — N1832 Chronic kidney disease, stage 3b: Secondary | ICD-10-CM | POA: Diagnosis not present

## 2022-04-10 DIAGNOSIS — Z8673 Personal history of transient ischemic attack (TIA), and cerebral infarction without residual deficits: Secondary | ICD-10-CM | POA: Diagnosis not present

## 2022-04-10 DIAGNOSIS — I10 Essential (primary) hypertension: Secondary | ICD-10-CM | POA: Diagnosis not present

## 2022-04-10 DIAGNOSIS — I639 Cerebral infarction, unspecified: Secondary | ICD-10-CM | POA: Diagnosis not present

## 2022-04-10 DIAGNOSIS — N1831 Chronic kidney disease, stage 3a: Secondary | ICD-10-CM | POA: Diagnosis not present

## 2022-04-10 DIAGNOSIS — I480 Paroxysmal atrial fibrillation: Secondary | ICD-10-CM | POA: Diagnosis not present

## 2022-04-10 DIAGNOSIS — R1312 Dysphagia, oropharyngeal phase: Secondary | ICD-10-CM | POA: Diagnosis not present

## 2022-04-10 DIAGNOSIS — N390 Urinary tract infection, site not specified: Secondary | ICD-10-CM | POA: Diagnosis not present

## 2022-04-10 NOTE — Telephone Encounter (Signed)
Per Pam, Dr. Kyla Balzarine RN, Dr. Curt Bears is refilling this medication

## 2022-04-11 DIAGNOSIS — N1831 Chronic kidney disease, stage 3a: Secondary | ICD-10-CM | POA: Diagnosis not present

## 2022-04-11 DIAGNOSIS — M6281 Muscle weakness (generalized): Secondary | ICD-10-CM | POA: Diagnosis not present

## 2022-04-11 DIAGNOSIS — R1312 Dysphagia, oropharyngeal phase: Secondary | ICD-10-CM | POA: Diagnosis not present

## 2022-04-11 DIAGNOSIS — I480 Paroxysmal atrial fibrillation: Secondary | ICD-10-CM | POA: Diagnosis not present

## 2022-04-11 DIAGNOSIS — I639 Cerebral infarction, unspecified: Secondary | ICD-10-CM | POA: Diagnosis not present

## 2022-04-11 DIAGNOSIS — R2689 Other abnormalities of gait and mobility: Secondary | ICD-10-CM | POA: Diagnosis not present

## 2022-04-12 DIAGNOSIS — I639 Cerebral infarction, unspecified: Secondary | ICD-10-CM | POA: Diagnosis not present

## 2022-04-12 DIAGNOSIS — I480 Paroxysmal atrial fibrillation: Secondary | ICD-10-CM | POA: Diagnosis not present

## 2022-04-12 DIAGNOSIS — I10 Essential (primary) hypertension: Secondary | ICD-10-CM | POA: Diagnosis not present

## 2022-04-12 DIAGNOSIS — R1312 Dysphagia, oropharyngeal phase: Secondary | ICD-10-CM | POA: Diagnosis not present

## 2022-04-12 DIAGNOSIS — N1831 Chronic kidney disease, stage 3a: Secondary | ICD-10-CM | POA: Diagnosis not present

## 2022-04-12 DIAGNOSIS — I495 Sick sinus syndrome: Secondary | ICD-10-CM | POA: Diagnosis not present

## 2022-04-12 DIAGNOSIS — R2689 Other abnormalities of gait and mobility: Secondary | ICD-10-CM | POA: Diagnosis not present

## 2022-04-12 DIAGNOSIS — Z8673 Personal history of transient ischemic attack (TIA), and cerebral infarction without residual deficits: Secondary | ICD-10-CM | POA: Diagnosis not present

## 2022-04-12 DIAGNOSIS — N1832 Chronic kidney disease, stage 3b: Secondary | ICD-10-CM | POA: Diagnosis not present

## 2022-04-12 DIAGNOSIS — N390 Urinary tract infection, site not specified: Secondary | ICD-10-CM | POA: Diagnosis not present

## 2022-04-12 DIAGNOSIS — M6281 Muscle weakness (generalized): Secondary | ICD-10-CM | POA: Diagnosis not present

## 2022-04-14 DIAGNOSIS — N1831 Chronic kidney disease, stage 3a: Secondary | ICD-10-CM | POA: Diagnosis not present

## 2022-04-14 DIAGNOSIS — M6281 Muscle weakness (generalized): Secondary | ICD-10-CM | POA: Diagnosis not present

## 2022-04-14 DIAGNOSIS — R1312 Dysphagia, oropharyngeal phase: Secondary | ICD-10-CM | POA: Diagnosis not present

## 2022-04-14 DIAGNOSIS — N1832 Chronic kidney disease, stage 3b: Secondary | ICD-10-CM | POA: Diagnosis not present

## 2022-04-14 DIAGNOSIS — I495 Sick sinus syndrome: Secondary | ICD-10-CM | POA: Diagnosis not present

## 2022-04-14 DIAGNOSIS — I480 Paroxysmal atrial fibrillation: Secondary | ICD-10-CM | POA: Diagnosis not present

## 2022-04-14 DIAGNOSIS — I10 Essential (primary) hypertension: Secondary | ICD-10-CM | POA: Diagnosis not present

## 2022-04-14 DIAGNOSIS — I639 Cerebral infarction, unspecified: Secondary | ICD-10-CM | POA: Diagnosis not present

## 2022-04-14 DIAGNOSIS — Z8673 Personal history of transient ischemic attack (TIA), and cerebral infarction without residual deficits: Secondary | ICD-10-CM | POA: Diagnosis not present

## 2022-04-14 DIAGNOSIS — N390 Urinary tract infection, site not specified: Secondary | ICD-10-CM | POA: Diagnosis not present

## 2022-04-14 DIAGNOSIS — R2689 Other abnormalities of gait and mobility: Secondary | ICD-10-CM | POA: Diagnosis not present

## 2022-04-17 DIAGNOSIS — I639 Cerebral infarction, unspecified: Secondary | ICD-10-CM | POA: Diagnosis not present

## 2022-04-17 DIAGNOSIS — R2689 Other abnormalities of gait and mobility: Secondary | ICD-10-CM | POA: Diagnosis not present

## 2022-04-17 DIAGNOSIS — R1312 Dysphagia, oropharyngeal phase: Secondary | ICD-10-CM | POA: Diagnosis not present

## 2022-04-17 DIAGNOSIS — N1831 Chronic kidney disease, stage 3a: Secondary | ICD-10-CM | POA: Diagnosis not present

## 2022-04-17 DIAGNOSIS — I480 Paroxysmal atrial fibrillation: Secondary | ICD-10-CM | POA: Diagnosis not present

## 2022-04-17 DIAGNOSIS — M6281 Muscle weakness (generalized): Secondary | ICD-10-CM | POA: Diagnosis not present

## 2022-04-18 DIAGNOSIS — E039 Hypothyroidism, unspecified: Secondary | ICD-10-CM | POA: Diagnosis not present

## 2022-04-18 DIAGNOSIS — I480 Paroxysmal atrial fibrillation: Secondary | ICD-10-CM | POA: Diagnosis not present

## 2022-04-18 DIAGNOSIS — M6281 Muscle weakness (generalized): Secondary | ICD-10-CM | POA: Diagnosis not present

## 2022-04-18 DIAGNOSIS — I639 Cerebral infarction, unspecified: Secondary | ICD-10-CM | POA: Diagnosis not present

## 2022-04-18 DIAGNOSIS — R2689 Other abnormalities of gait and mobility: Secondary | ICD-10-CM | POA: Diagnosis not present

## 2022-04-18 DIAGNOSIS — R1312 Dysphagia, oropharyngeal phase: Secondary | ICD-10-CM | POA: Diagnosis not present

## 2022-04-18 DIAGNOSIS — I700 Atherosclerosis of aorta: Secondary | ICD-10-CM | POA: Diagnosis not present

## 2022-04-18 DIAGNOSIS — N1831 Chronic kidney disease, stage 3a: Secondary | ICD-10-CM | POA: Diagnosis not present

## 2022-04-19 ENCOUNTER — Encounter: Payer: Self-pay | Admitting: Adult Health

## 2022-04-19 DIAGNOSIS — M6281 Muscle weakness (generalized): Secondary | ICD-10-CM | POA: Diagnosis not present

## 2022-04-19 DIAGNOSIS — Z8673 Personal history of transient ischemic attack (TIA), and cerebral infarction without residual deficits: Secondary | ICD-10-CM | POA: Diagnosis not present

## 2022-04-19 DIAGNOSIS — N1832 Chronic kidney disease, stage 3b: Secondary | ICD-10-CM | POA: Diagnosis not present

## 2022-04-19 DIAGNOSIS — I495 Sick sinus syndrome: Secondary | ICD-10-CM | POA: Diagnosis not present

## 2022-04-19 DIAGNOSIS — N390 Urinary tract infection, site not specified: Secondary | ICD-10-CM | POA: Diagnosis not present

## 2022-04-19 DIAGNOSIS — I10 Essential (primary) hypertension: Secondary | ICD-10-CM | POA: Diagnosis not present

## 2022-04-19 DIAGNOSIS — I480 Paroxysmal atrial fibrillation: Secondary | ICD-10-CM | POA: Diagnosis not present

## 2022-04-19 NOTE — Progress Notes (Signed)
This encounter was created in error - please disregard.

## 2022-04-20 ENCOUNTER — Other Ambulatory Visit: Payer: Self-pay | Admitting: *Deleted

## 2022-04-20 ENCOUNTER — Encounter (HOSPITAL_COMMUNITY)
Admission: RE | Admit: 2022-04-20 | Discharge: 2022-04-20 | Disposition: A | Discharge status: Home / Self Care | Payer: Medicare Other | Source: Skilled Nursing Facility | Attending: Adult Health | Admitting: Adult Health

## 2022-04-20 ENCOUNTER — Encounter: Payer: Self-pay | Admitting: Adult Health

## 2022-04-20 ENCOUNTER — Non-Acute Institutional Stay (SKILLED_NURSING_FACILITY): Payer: Medicare Other | Admitting: Adult Health

## 2022-04-20 DIAGNOSIS — I480 Paroxysmal atrial fibrillation: Secondary | ICD-10-CM | POA: Diagnosis not present

## 2022-04-20 DIAGNOSIS — R634 Abnormal weight loss: Secondary | ICD-10-CM | POA: Diagnosis not present

## 2022-04-20 DIAGNOSIS — K219 Gastro-esophageal reflux disease without esophagitis: Secondary | ICD-10-CM

## 2022-04-20 DIAGNOSIS — E440 Moderate protein-calorie malnutrition: Secondary | ICD-10-CM | POA: Diagnosis not present

## 2022-04-20 DIAGNOSIS — M810 Age-related osteoporosis without current pathological fracture: Secondary | ICD-10-CM

## 2022-04-20 DIAGNOSIS — I4891 Unspecified atrial fibrillation: Secondary | ICD-10-CM | POA: Diagnosis not present

## 2022-04-20 DIAGNOSIS — I495 Sick sinus syndrome: Secondary | ICD-10-CM

## 2022-04-20 DIAGNOSIS — N1832 Chronic kidney disease, stage 3b: Secondary | ICD-10-CM | POA: Insufficient documentation

## 2022-04-20 DIAGNOSIS — I700 Atherosclerosis of aorta: Secondary | ICD-10-CM

## 2022-04-20 LAB — VITAMIN D 25 HYDROXY (VIT D DEFICIENCY, FRACTURES): Vit D, 25-Hydroxy: 45.06 ng/mL (ref 30–100)

## 2022-04-20 LAB — CBC
HCT: 39.8 % (ref 36.0–46.0)
Hemoglobin: 12.4 g/dL (ref 12.0–15.0)
MCH: 30.4 pg (ref 26.0–34.0)
MCHC: 31.2 g/dL (ref 30.0–36.0)
MCV: 97.5 fL (ref 80.0–100.0)
Platelets: 204 10*3/uL (ref 150–400)
RBC: 4.08 MIL/uL (ref 3.87–5.11)
RDW: 13.9 % (ref 11.5–15.5)
WBC: 5.9 10*3/uL (ref 4.0–10.5)
nRBC: 0 % (ref 0.0–0.2)

## 2022-04-20 LAB — COMPREHENSIVE METABOLIC PANEL
ALT: 10 U/L (ref 0–44)
AST: 19 U/L (ref 15–41)
Albumin: 3.4 g/dL — ABNORMAL LOW (ref 3.5–5.0)
Alkaline Phosphatase: 47 U/L (ref 38–126)
Anion gap: 6 (ref 5–15)
BUN: 14 mg/dL (ref 8–23)
CO2: 26 mmol/L (ref 22–32)
Calcium: 8.4 mg/dL — ABNORMAL LOW (ref 8.9–10.3)
Chloride: 108 mmol/L (ref 98–111)
Creatinine, Ser: 0.83 mg/dL (ref 0.44–1.00)
GFR, Estimated: >60 mL/min (ref >60)
Glucose, Bld: 86 mg/dL (ref 70–99)
Potassium: 4 mmol/L (ref 3.5–5.1)
Sodium: 140 mmol/L (ref 135–145)
Total Bilirubin: 0.5 mg/dL (ref 0.3–1.2)
Total Protein: 6.3 g/dL — ABNORMAL LOW (ref 6.5–8.1)

## 2022-04-20 LAB — IRON AND TIBC
Iron: 58 ug/dL (ref 28–170)
Saturation Ratios: 19 % (ref 10.4–31.8)
TIBC: 304 ug/dL (ref 250–450)
UIBC: 246 ug/dL

## 2022-04-20 LAB — TSH: TSH: 3.839 u[IU]/mL (ref 0.350–4.500)

## 2022-04-20 NOTE — Patient Outreach (Signed)
Per [name] Mrs. Carbonneau recently admitted to [place] SNF.   Confirmed with SNF social worker, Mrs. Fanguy transferred from [facility] under long term care. Mrs. Mongeau is now under long term care in [place].   No identifiable [facility] care coordination needs.    Marthenia Rolling, MSN, RN,BSN [facility] Acute Care Coordinator [phone] (Direct dial)

## 2022-04-20 NOTE — Progress Notes (Signed)
Location:  [facility] Room Number: 466 Place of Service:  SNF (31)   CODE STATUS: dnr   Allergies  Allergen Reactions   Septra Ds [Sulfamethoxazole-Trimethoprim]    Lisinopril Other (See Comments)    Cough    Chief Complaint  Patient presents with   Acute Visit    Follow up transfer     HPI:  She is a former resident of this facility who has returned. She tells me that she is happy to be back. She is presently denying any pain. She has had insomnia at the other snf that she was at. This does present a long term placement for her. She will continue to be followed for her chronic illnesses including: History of CVA (cerebral vascular accident) Atrial fibrillation/sick sinus syndrome;  Primary hypertension:Gastroesophageal reflux disease without esophagitis. Her family wants her to take minimal medications.   Past Medical History:  Diagnosis Date   Acute metabolic encephalopathy 59/9357   Atrial fibrillation ([place])    Bleeding stomach ulcer 1980s?   CVA (cerebral vascular accident) ([place]) 07/2021   DIVERTICULOSIS, COLON 11/22/2006   Headache(784.0)    "very often; not regular" (11/25/2015)   Heart block    hx of   Hip fracture ([place]) 07/2021   History of blood transfusion 1980s   "when I had the bleeding ulcer"   HYPERLIPIDEMIA 11/22/2006   Hypertension    Melanoma ([place])    LLE   Menopausal syndrome (hot flashes)    OSTEOPOROSIS 11/22/2006   Presence of permanent cardiac pacemaker    Vitamin D deficiency 08/21/2021    Past Surgical History:  Procedure Laterality Date   APPENDECTOMY     BUNIONECTOMY Right    CATARACT EXTRACTION W/ INTRAOCULAR LENS  IMPLANT, BILATERAL Bilateral 2000s   CHOLECYSTECTOMY OPEN     EP IMPLANTABLE DEVICE N/A 11/25/2015   Procedure: Pacemaker Implant;  Surgeon: Will Meredith Leeds, MD;  Location: [facility];  Service: Cardiovascular;  Laterality: N/A;   FEMUR IM NAIL Left 07/08/2014   Procedure: INTRAMEDULLARY  (IM) NAIL ;  Surgeon: Meredith Pel, MD;  Location[facility];  Service: Orthopedics;  Laterality: Left;   FRACTURE SURGERY     HIP ARTHROPLASTY Right 08/20/2021   Procedure: ARTHROPLASTY BIPOLAR HIP (HEMIARTHROPLASTY);  Surgeon: Altamese Farmingdale, MD;  Location: [facility];  Service: Orthopedics;  Laterality: Right;   IR KYPHO LUMBAR INC FX REDUCE BONE BX UNI/BIL CANNULATION INC/IMAGING  03/21/2021   PACEMAKER INSERTION     REVISION TOTAL HIP ARTHROPLASTY Left 06/2014    Social History   Socioeconomic History   Marital status: Widowed    Spouse name: Not on file   Number of children: 2   Years of education: 24   Highest education level: Not on file  Occupational History    Comment: retired  Tobacco Use   Smoking status: Never    Passive exposure: Never   Smokeless tobacco: Never  Vaping Use   Vaping Use: Never used  Substance and Sexual Activity   Alcohol use: No    Alcohol/week: 0.0 standard drinks of alcohol   Drug use: No   Sexual activity: Never  Other Topics Concern   Not on file  Social History Narrative   09/21/21 resides at [place] alone, son lives beside her   Caffeine - none   Social Determinants of Health   Financial Resource Strain: Low Risk  (05/13/2020)   Overall Financial Resource Strain (  CARDIA)    Difficulty of Paying Living Expenses: Not hard at all  Food Insecurity: No Food Insecurity (05/13/2020)   Hunger Vital Sign    Worried About Running Out of Food in the Last Year: Never true    Ran Out of Food in the Last Year: Never true  Transportation Needs: No Transportation Needs (05/13/2020)   PRAPARE - Hydrologist (Medical): No    Lack of Transportation (Non-Medical): No  Physical Activity: Inactive (05/13/2020)   Exercise Vital Sign    Days of Exercise per Week: 0 days    Minutes of Exercise per Session: 0 min  Stress: No Stress Concern Present (05/13/2020)   [place]    Feeling of Stress : Not at all  Social Connections: Socially Isolated (05/13/2020)   Social Connection and Isolation Panel [NHANES]    Frequency of Communication with Friends and Family: More than three times a week    Frequency of Social Gatherings with Friends and Family: More than three times a week    Attends Religious Services: Never    Marine scientist or Organizations: No    Attends Archivist Meetings: Never    Marital Status: Widowed  Intimate Partner Violence: Not At Risk (05/13/2020)   Humiliation, Afraid, Rape, and Kick questionnaire    Fear of Current or Ex-Partner: No    Emotionally Abused: No    Physically Abused: No    Sexually Abused: No   Family History  Problem Relation Age of Onset   Alzheimer's disease Sister    Cardiomyopathy Brother       VITAL SIGNS BP (!) 150/78   Pulse 60   Temp 98.3 F (36.8 C)   Resp 20   Ht '5\' 5"'$  (1.651 m)   Wt 112 lb 6.4 oz (51 kg)   SpO2 100%   BMI 18.70 kg/m   Outpatient Encounter Medications as of 04/20/2022  Medication Sig   Acetaminophen (TYLENOL ARTHRITIS PAIN PO) Take 650 mg by mouth every 6 (six) hours.   amiodarone (PACERONE) 200 MG tablet Take 1 tablet (200 mg total) by mouth daily.   aspirin 81 MG chewable tablet Chew 1 tablet (81 mg total) by mouth daily.   Balsam [place]-Castor Oil [facility]) OINT Special Instructions: apply to DTI Right & Left medial buttock Every Shift; Day   magnesium hydroxide (MILK OF MAGNESIA) 400 MG/5ML suspension Take 30 mLs by mouth daily as needed for mild constipation.   nitrofurantoin, macrocrystal-monohydrate, (MACROBID) 100 MG capsule Take 100 mg by mouth at bedtime. UTI prophylaxis   NON FORMULARY Diet - Dysphagia 3   pantoprazole (PROTONIX) 40 MG tablet Take 40 mg by mouth daily.   senna (SENOKOT) 8.6 MG TABS tablet Take 1 tablet by mouth in the morning and at bedtime.   [DISCONTINUED] denosumab (PROLIA) 60 MG/ML SOSY injection  Inject 60 mg into the skin every 6 (six) months.   [DISCONTINUED] gabapentin (NEURONTIN) 100 MG capsule Take 100 mg by mouth at bedtime.   [DISCONTINUED] lidocaine 4 % Place 1 patch onto the skin. place on in the AM and remove at [facility] for right flank pain   [DISCONTINUED] methocarbamol (ROBAXIN) 500 MG tablet Take 500 mg by mouth every 8 (eight) hours as needed (back pain).   [DISCONTINUED] Vitamin D, Ergocalciferol, (DRISDOL) 1.25 MG (50000 UNIT) CAPS capsule Take 50,000 Units by mouth every 7 (seven) days.   No facility-administered encounter medications on  file as of 04/20/2022.     SIGNIFICANT DIAGNOSTIC EXAMS   PREVIOUS   08-17-21: ct head stroke:  1. No acute CT finding. Atrophy and chronic small-vessel ischemic changes. 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 2. ASPECTS is 10, allowing for the old infarction  08-17-21: ct angio of head:  1. Decreased perfusion (Tmax> 6 seconds) in the posterior left MCA territory, which correlates with an area with decreased contrast enhancement in the MCA branches. While there is mild to moderate narrowing in the distal left M1 segment, no additional focal stenosis or occlusion is seen in the left MCA branches. No infarct core. 2. Multifocal narrowing in the bilateral PCAs. No other hemodynamically significant intracranial stenosis. 3. Approximately 50% proximal left ICA stenosis, unchanged. No additional hemodynamically significant stenosis in the neck.  08-17-21: right hip x-ray: Acute displaced right femoral neck fracture.   08-18-21: MRI of brain 1. Moderately motion limited study with evidence of acute left frontal and parietal infarcts. The left parietal infarct is along the posterior aspect of a remote left parietal infarct. 2. Moderate chronic microvascular ischemic disease. 3.  Cerebral atrophy   01-04-22: dexa scan: t score -4.184   NO NEW EXAMS   LABS REVIEWED; PREVIOUS   08-17-21: wbc 19.6 hgb 14.3; hct 45.2 mcv 94.8 plt 209; glucose  144; bun 15; creat 1.22 k+ 3.8; na++ 137; ca 9.1; GFR 42; protein 6.7; albumin 3.8 hgb a1c 5.7 08-18-21: urine culture <10,000. Vitamin B 12: 232; tsh 1.864 08-21-21: vit D 12.78 08-23-21: vitamin B 12: 204; iron 22; tibc 185; ferritin 234;  08-24-21: wbc 9.2; hgb 9.1; hct 27.0; mcv 90.0 plt 154; glucose 104; bun 17; creat 0.76; k + 3.8; na++ 136; ca 8.1; GFR >60 09-30-21: tsh 4.221 10-18-21: urine culture: 40,000 colonies: citrobacter freundii  01-16-22; glucose 62; bun 19; creat 0.91; k+ 4.6; na++ 139; ca 8.7; gfr 60; vitamin D 29.35   NO NEW LABS.    Review of Systems  Constitutional:  Negative for malaise/fatigue.  Respiratory:  Negative for cough and shortness of breath.   Cardiovascular:  Negative for chest pain, palpitations and leg swelling.  Gastrointestinal:  Negative for abdominal pain, constipation and heartburn.  Musculoskeletal:  Negative for back pain, joint pain and myalgias.  Skin: Negative.   Neurological:  Negative for dizziness.  Psychiatric/Behavioral:  The patient is not nervous/anxious.    Physical Exam Constitutional:      General: She is not in acute distress.    Appearance: She is well-developed. She is not diaphoretic.  Neck:     Thyroid: No thyromegaly.  Cardiovascular:     Rate and Rhythm: Normal rate and regular rhythm.     Pulses: Normal pulses.     Heart sounds: Murmur heard.  Pulmonary:     Effort: Pulmonary effort is normal. No respiratory distress.     Breath sounds: Normal breath sounds.  Abdominal:     General: Bowel sounds are normal. There is no distension.     Palpations: Abdomen is soft.     Tenderness: There is no abdominal tenderness.  Musculoskeletal:        General: Normal range of motion.     Cervical back: Neck supple.     Right lower leg: No edema.     Left lower leg: No edema.  Lymphadenopathy:     Cervical: No cervical adenopathy.  Skin:    General: Skin is warm and dry.  Neurological:     Mental Status: She is alert and  oriented  to person, place, and time.  Psychiatric:        Mood and Affect: Mood normal.       ASSESSMENT/ PLAN:   TODAY  History of CVA (cerebral vascular accident) will continue asa 81 mg daily   2. Atrial fibrillation/sick sinus syndrome; will continue amiodarone 200 mg daily; asa 81 mg daily   3. Primary hypertension: b/p 150/78: will monitor   4. Gastroesophageal reflux disease without esophagitis: will continue to monitor    5. Chronic kidney disease stage 3b: bun 19; creat 0.91; gfr 60  6. Acute post operative anemia due to expected blood loss: hgb 9.1 will monitor   7. Mixed hyperlipidemia: is off lipitor   8. Vitamin D deficiency: level: 29.35 will monitor   9. Vitamin B 12 deficiency: level 204 will continue to monitor  10. Aortic atherosclerosis (ct 08/02/21) will continue asa 81 mg daily; off statin due to advanced age.   11. Post menopausal osteoporosis t score -4.184 will continue to monitor   12. Protein calorie malnutrition: will continue boost twice daily    Ok Edwards NP [facility] Adult Medicine   call [phone]

## 2022-04-21 ENCOUNTER — Non-Acute Institutional Stay (SKILLED_NURSING_FACILITY): Payer: Medicare Other | Admitting: Internal Medicine

## 2022-04-21 ENCOUNTER — Encounter: Payer: Self-pay | Admitting: Internal Medicine

## 2022-04-21 DIAGNOSIS — M545 Low back pain, unspecified: Secondary | ICD-10-CM | POA: Diagnosis not present

## 2022-04-21 DIAGNOSIS — G8929 Other chronic pain: Secondary | ICD-10-CM | POA: Diagnosis not present

## 2022-04-21 DIAGNOSIS — I480 Paroxysmal atrial fibrillation: Secondary | ICD-10-CM | POA: Diagnosis not present

## 2022-04-21 DIAGNOSIS — R627 Adult failure to thrive: Secondary | ICD-10-CM | POA: Diagnosis not present

## 2022-04-21 DIAGNOSIS — D649 Anemia, unspecified: Secondary | ICD-10-CM | POA: Diagnosis not present

## 2022-04-21 DIAGNOSIS — I10 Essential (primary) hypertension: Secondary | ICD-10-CM | POA: Diagnosis not present

## 2022-04-21 NOTE — Progress Notes (Unsigned)
NURSING HOME LOCATION:  [facility] ROOM NUMBER:  55 W  CODE STATUS:  DNR  PCP:  Ok Edwards NP  This is a nursing facility follow up visit for [place] readmission within 30 days  Interim medical record and care since last SNF visit was updated with review of diagnostic studies and change in clinical status since last visit were documented.  HPI: She was readmitted 11/29, transfering back from [facility].  She had been discharged to that facility from [facility] to be closer to her family.  She states that the transfer was requested by her family because "2 women were hollering all the time and got on my nerves." At admission labs were updated.  Protein/caloric malnutrition was present with an albumin of 3.4 and total protein of 6.3.  Review of systems: She states "I cannot walk too well."  She describes "mostly pain from the knees to the feet."  She was unable to quantify or characterize the pain beyond "like muscle."  When I asked the severity of the pain on a scale of 1-10 her response was "yes, 1."  She also has had chronic low back pain in the context of prior low back surgery.  She describes some peripheral edema but no active cardiopulmonary symptoms.  Constitutional: No fever, significant weight change, fatigue  Eyes: No redness, discharge, pain, vision change ENT/mouth: No nasal congestion,  purulent discharge, earache, change in hearing, sore throat  Cardiovascular: No chest pain, palpitations, paroxysmal nocturnal dyspnea, claudication  Respiratory: No cough, sputum production, hemoptysis, DOE, significant snoring, apnea   Gastrointestinal: No heartburn, dysphagia, abdominal pain, nausea /vomiting, rectal bleeding, melena, change in bowels Genitourinary: No dysuria, hematuria, pyuria, incontinence, nocturia Musculoskeletal: No joint stiffness, joint swelling Dermatologic: No rash, pruritus, change in appearance of skin Neurologic: No dizziness,  headache, syncope, seizures, numbness, tingling Psychiatric: No significant anxiety, depression, insomnia, anorexia Endocrine: No change in hair/skin/nails, excessive thirst, excessive hunger, excessive urination  Hematologic/lymphatic: No significant bruising, lymphadenopathy, abnormal bleeding Allergy/immunology: No itchy/watery eyes, significant sneezing, urticaria, angioedema  Physical exam:  Pertinent or positive findings: She exhibits a very fine tremor of her head.  Facies are blank.  There is minimal asymmetry of the nasolabial folds.  Cheeks are somewhat sunken.  She is missing multiple maxillary teeth.  Bronchovesicular breath sounds are present superiorly.  She has trace edema at the sock line.  Interosseous wasting of the hands is noted.  She has fine tremor of both hands.  There is fair strength to opposition in all extremities.Straight leg raising was negative.  General appearance: Adequately nourished; no acute distress, increased work of breathing is present.   Lymphatic: No lymphadenopathy about the head, neck, axilla. Eyes: No conjunctival inflammation or lid edema is present. There is no scleral icterus. Ears:  External ear exam shows no significant lesions or deformities.   Nose:  External nasal examination shows no deformity or inflammation. Nasal mucosa are pink and moist without lesions, exudates Neck:  No thyromegaly, masses, tenderness noted.    Heart:  Normal rate and regular rhythm. S1 and S2 normal without gallop, murmur, click, rub .  Lungs: without wheezes, rhonchi, rales, rubs. Abdomen: Bowel sounds are normal. Abdomen is soft and nontender with no organomegaly, hernias, masses. GU: Deferred  Extremities:  No cyanosis, clubbing  Neurologic exam :Balance, Rhomberg, finger to nose testing could not be completed due to clinical state Skin: Warm & dry w/o tenting. No significant lesions or rash.  See summary under  each active problem in the Problem List with  associated updated therapeutic plan

## 2022-04-21 NOTE — Assessment & Plan Note (Addendum)
PT/OT evaluation SNF.  Nutritionist to follow.

## 2022-04-21 NOTE — Assessment & Plan Note (Signed)
Evan/30/23 CBC is normal as is iron panel.  No bleeding dyscrasias reported at the SNF.  Continue to monitor.

## 2022-04-21 NOTE — Assessment & Plan Note (Signed)
Clinically rhythm is regular and rate well controlled.S/P pacemaker placement. No evidence of PAF at this time.  No change in medications indicated.

## 2022-04-21 NOTE — Assessment & Plan Note (Signed)
She describes chronic low back pain which she has difficulty characterizing or quantitating.  She describes the greatest pain from the knees to her feet bilaterally.  Straight leg raising is negative. PT/OT to evaluate.

## 2022-04-21 NOTE — Patient Instructions (Signed)
See assessment and plan under each diagnosis in the problem list and acutely for this visit 

## 2022-04-22 NOTE — Assessment & Plan Note (Addendum)
Mild hypertension in context of chronic pain & anxiety about  is improving. Continue to monitor; consider low dose B blocker if persistent

## 2022-05-05 ENCOUNTER — Encounter: Payer: Self-pay | Admitting: Adult Health

## 2022-05-05 ENCOUNTER — Non-Acute Institutional Stay (SKILLED_NURSING_FACILITY): Payer: Medicare Other | Admitting: Adult Health

## 2022-05-05 DIAGNOSIS — I700 Atherosclerosis of aorta: Secondary | ICD-10-CM | POA: Diagnosis not present

## 2022-05-05 DIAGNOSIS — N1832 Chronic kidney disease, stage 3b: Secondary | ICD-10-CM

## 2022-05-05 DIAGNOSIS — I495 Sick sinus syndrome: Secondary | ICD-10-CM

## 2022-05-05 NOTE — Progress Notes (Unsigned)
Location:  [facility] Room Number: NO/122/W Place of Service:  SNF (31) Ok Edwards S.,NP  CODE STATUS: DNR  Allergies  Allergen Reactions   Septra Ds [Sulfamethoxazole-Trimethoprim]    Lisinopril Other (See Comments)    Cough    Chief Complaint  Patient presents with   Acute Visit    Patient is being seen for care plan management    HPI:  We have come together for her care plan meeting. Family present. BIMS 15/15 mood 3/30: decreased energy . She is nonambulatory without falls. She requires extensive assist with her adl care. She is continent of bladder and frequently incontinent of bowel. Dietary:set up with eating weight is 110.8 pounds is stable on D3 diet with 25-75% of meals is on supplements. Therapy: none at this time. Activities; she is encouraged to attend. We have discussed her advanced directive with MOST form filled out she continues to be followed for her chronic illnesses including: Aortic atherosclerosis  Chronic kidney disease stage 3b Sick sinus syndrome  Past Medical History:  Diagnosis Date   Acute metabolic encephalopathy 56/3875   Atrial fibrillation (HCC)    Bleeding stomach ulcer 1980s?   CVA (cerebral vascular accident) ([place]) 07/2021   DIVERTICULOSIS, COLON 11/22/2006   Headache(784.0)    "very often; not regular" (11/25/2015)   Heart block    hx of   Hip fracture ([place]) 07/2021   History of blood transfusion 1980s   "when I had the bleeding ulcer"   HYPERLIPIDEMIA 11/22/2006   Hypertension    Melanoma ([place])    LLE   Menopausal syndrome (hot flashes)    OSTEOPOROSIS 11/22/2006   Presence of permanent cardiac pacemaker    Vitamin D deficiency 08/21/2021    Past Surgical History:  Procedure Laterality Date   APPENDECTOMY     BUNIONECTOMY Right    CATARACT EXTRACTION W/ INTRAOCULAR LENS  IMPLANT, BILATERAL Bilateral 2000s   CHOLECYSTECTOMY OPEN     EP IMPLANTABLE DEVICE N/A 11/25/2015   Procedure: Pacemaker Implant;   Surgeon: Will Meredith Leeds, MD;  Location: [facility];  Service: Cardiovascular;  Laterality: N/A;   FEMUR IM NAIL Left 07/08/2014   Procedure: INTRAMEDULLARY (IM) NAIL ;  Surgeon: Meredith Pel, MD;  Location: [facility];  Service: Orthopedics;  Laterality: Left;   FRACTURE SURGERY     HIP ARTHROPLASTY Right 08/20/2021   Procedure: ARTHROPLASTY BIPOLAR HIP (HEMIARTHROPLASTY);  Surgeon: Altamese Buckholts, MD;  Location: [facility];  Service: Orthopedics;  Laterality: Right;   IR KYPHO LUMBAR INC FX REDUCE BONE BX UNI/BIL CANNULATION INC/IMAGING  03/21/2021   PACEMAKER INSERTION     REVISION TOTAL HIP ARTHROPLASTY Left 06/2014    Social History   Socioeconomic History   Marital status: Widowed    Spouse name: Not on file   Number of children: 2   Years of education: 79   Highest education level: Not on file  Occupational History    Comment: retired  Tobacco Use   Smoking status: Never    Passive exposure: Never   Smokeless tobacco: Never  Vaping Use   Vaping Use: Never used  Substance and Sexual Activity   Alcohol use: No    Alcohol/week: 0.0 standard drinks of alcohol   Drug use: No   Sexual activity: Never  Other Topics Concern   Not on file  Social History Narrative   09/21/21 resides at [place] alone, son lives beside her   Caffeine -  none   Social Determinants of Health   Financial Resource Strain: Low Risk  (05/13/2020)   Overall Financial Resource Strain (CARDIA)    Difficulty of Paying Living Expenses: Not hard at all  Food Insecurity: No Food Insecurity (05/13/2020)   Hunger Vital Sign    Worried About Running Out of Food in the Last Year: Never true    Ran Out of Food in the Last Year: Never true  Transportation Needs: No Transportation Needs (05/13/2020)   PRAPARE - Hydrologist (Medical): No    Lack of Transportation (Non-Medical): No  Physical Activity: Inactive (05/13/2020)   Exercise Vital Sign    Days of  Exercise per Week: 0 days    Minutes of Exercise per Session: 0 min  Stress: No Stress Concern Present (05/13/2020)   [place]    Feeling of Stress : Not at all  Social Connections: Socially Isolated (05/13/2020)   Social Connection and Isolation Panel [NHANES]    Frequency of Communication with Friends and Family: More than three times a week    Frequency of Social Gatherings with Friends and Family: More than three times a week    Attends Religious Services: Never    Marine scientist or Organizations: No    Attends Archivist Meetings: Never    Marital Status: Widowed  Intimate Partner Violence: Not At Risk (05/13/2020)   Humiliation, Afraid, Rape, and Kick questionnaire    Fear of Current or Ex-Partner: No    Emotionally Abused: No    Physically Abused: No    Sexually Abused: No   Family History  Problem Relation Age of Onset   Alzheimer's disease Sister    Cardiomyopathy Brother       VITAL SIGNS BP (!) 112/50   Pulse (!) 59   Temp (!) 97.3 F (36.3 C)   Resp 20   Ht '5\' 5"'$  (1.651 m)   Wt 110 lb (49.9 kg)   SpO2 97%   BMI 18.30 kg/m   Outpatient Encounter Medications as of 05/05/2022  Medication Sig   Acetaminophen (TYLENOL ARTHRITIS PAIN PO) Take 650 mg by mouth every 6 (six) hours.   amiodarone (PACERONE) 200 MG tablet Take 1 tablet (200 mg total) by mouth daily.   aspirin 81 MG chewable tablet Chew 1 tablet (81 mg total) by mouth daily.   Balsam [place]-Castor Oil [facility]) OINT Special Instructions: apply to DTI Right & Left medial buttock Every Shift; Day   magnesium hydroxide (MILK OF MAGNESIA) 400 MG/5ML suspension Take 30 mLs by mouth daily as needed for mild constipation.   nitrofurantoin, macrocrystal-monohydrate, (MACROBID) 100 MG capsule Take 100 mg by mouth at bedtime. UTI prophylaxis   NON FORMULARY Diet - Dysphagia 3   pantoprazole (PROTONIX) 40 MG tablet Take 40 mg by  mouth daily.   senna (SENOKOT) 8.6 MG TABS tablet Take 1 tablet by mouth in the morning and at bedtime.   No facility-administered encounter medications on file as of 05/05/2022.     SIGNIFICANT DIAGNOSTIC EXAMS  PREVIOUS   08-17-21: ct head stroke:  1. No acute CT finding. Atrophy and chronic small-vessel ischemic changes. 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 2. ASPECTS is 10, allowing for the old infarction  08-17-21: ct angio of head:  1. Decreased perfusion (Tmax> 6 seconds) in the posterior left MCA territory, which correlates with an area with decreased contrast enhancement in the MCA branches. While there is  mild to moderate narrowing in the distal left M1 segment, no additional focal stenosis or occlusion is seen in the left MCA branches. No infarct core. 2. Multifocal narrowing in the bilateral PCAs. No other hemodynamically significant intracranial stenosis. 3. Approximately 50% proximal left ICA stenosis, unchanged. No additional hemodynamically significant stenosis in the neck.  08-17-21: right hip x-ray: Acute displaced right femoral neck fracture.   08-18-21: MRI of brain 1. Moderately motion limited study with evidence of acute left frontal and parietal infarcts. The left parietal infarct is along the posterior aspect of a remote left parietal infarct. 2. Moderate chronic microvascular ischemic disease. 3.  Cerebral atrophy   01-04-22: dexa scan: t score -4.184   NO NEW EXAMS   LABS REVIEWED; PREVIOUS   08-17-21: wbc 19.6 hgb 14.3; hct 45.2 mcv 94.8 plt 209; glucose 144; bun 15; creat 1.22 k+ 3.8; na++ 137; ca 9.1; GFR 42; protein 6.7; albumin 3.8 hgb a1c 5.7 08-18-21: urine culture <10,000. Vitamin B 12: 232; tsh 1.864 08-21-21: vit D 12.78 08-23-21: vitamin B 12: 204; iron 22; tibc 185; ferritin 234;  08-24-21: wbc 9.2; hgb 9.1; hct 27.0; mcv 90.0 plt 154; glucose 104; bun 17; creat 0.76; k + 3.8; na++ 136; ca 8.1; GFR >60 09-30-21: tsh 4.221 10-18-21: urine culture:  40,000 colonies: citrobacter freundii  01-16-22; glucose 62; bun 19; creat 0.91; k+ 4.6; na++ 139; ca 8.7; gfr 60; vitamin D 29.35   NO NEW LABS.    Review of Systems  Constitutional:  Negative for malaise/fatigue.  Respiratory:  Negative for cough and shortness of breath.   Cardiovascular:  Negative for chest pain, palpitations and leg swelling.  Gastrointestinal:  Negative for abdominal pain, constipation and heartburn.  Musculoskeletal:  Negative for back pain, joint pain and myalgias.  Skin: Negative.   Neurological:  Negative for dizziness.  Psychiatric/Behavioral:  The patient is not nervous/anxious.     Physical Exam Constitutional:      General: She is not in acute distress.    Appearance: She is well-developed. She is not diaphoretic.  Neck:     Thyroid: No thyromegaly.  Cardiovascular:     Rate and Rhythm: Normal rate and regular rhythm.     Pulses: Normal pulses.     Heart sounds: Murmur heard.  Pulmonary:     Effort: Pulmonary effort is normal. No respiratory distress.     Breath sounds: Normal breath sounds.  Abdominal:     General: Bowel sounds are normal. There is no distension.     Palpations: Abdomen is soft.     Tenderness: There is no abdominal tenderness.  Musculoskeletal:        General: Normal range of motion.     Cervical back: Neck supple.     Right lower leg: No edema.     Left lower leg: No edema.  Lymphadenopathy:     Cervical: No cervical adenopathy.  Skin:    General: Skin is warm and dry.  Neurological:     Mental Status: She is alert. Mental status is at baseline.  Psychiatric:        Mood and Affect: Mood normal.       ASSESSMENT/ PLAN:  TODAY  Aortic atherosclerosis Chronic kidney disease stage 3b Sick sinus syndrome  Will continue current medications Her MOST form has been filled out Will continue to monitor her status'  Time spent with patient 40 minutes (20 minutes on advanced directives with MOST form filled  out)     Neoma Laming  Brooks Stotz NP [facility]  call [phone]

## 2022-05-17 DIAGNOSIS — M6281 Muscle weakness (generalized): Secondary | ICD-10-CM | POA: Diagnosis not present

## 2022-05-17 DIAGNOSIS — Z9181 History of falling: Secondary | ICD-10-CM | POA: Diagnosis not present

## 2022-05-17 DIAGNOSIS — R262 Difficulty in walking, not elsewhere classified: Secondary | ICD-10-CM | POA: Diagnosis not present

## 2022-05-17 DIAGNOSIS — I639 Cerebral infarction, unspecified: Secondary | ICD-10-CM | POA: Diagnosis not present

## 2022-05-18 DIAGNOSIS — M6281 Muscle weakness (generalized): Secondary | ICD-10-CM | POA: Diagnosis not present

## 2022-05-18 DIAGNOSIS — R262 Difficulty in walking, not elsewhere classified: Secondary | ICD-10-CM | POA: Diagnosis not present

## 2022-05-18 DIAGNOSIS — I639 Cerebral infarction, unspecified: Secondary | ICD-10-CM | POA: Diagnosis not present

## 2022-05-18 DIAGNOSIS — Z9181 History of falling: Secondary | ICD-10-CM | POA: Diagnosis not present

## 2022-05-19 ENCOUNTER — Non-Acute Institutional Stay (SKILLED_NURSING_FACILITY): Payer: Medicare Other | Admitting: Adult Health

## 2022-05-19 ENCOUNTER — Encounter: Payer: Self-pay | Admitting: Adult Health

## 2022-05-19 DIAGNOSIS — Z8673 Personal history of transient ischemic attack (TIA), and cerebral infarction without residual deficits: Secondary | ICD-10-CM

## 2022-05-19 DIAGNOSIS — I482 Chronic atrial fibrillation, unspecified: Secondary | ICD-10-CM

## 2022-05-19 DIAGNOSIS — M6281 Muscle weakness (generalized): Secondary | ICD-10-CM | POA: Diagnosis not present

## 2022-05-19 DIAGNOSIS — I495 Sick sinus syndrome: Secondary | ICD-10-CM

## 2022-05-19 DIAGNOSIS — R262 Difficulty in walking, not elsewhere classified: Secondary | ICD-10-CM | POA: Diagnosis not present

## 2022-05-19 DIAGNOSIS — I10 Essential (primary) hypertension: Secondary | ICD-10-CM | POA: Diagnosis not present

## 2022-05-19 DIAGNOSIS — K219 Gastro-esophageal reflux disease without esophagitis: Secondary | ICD-10-CM

## 2022-05-19 DIAGNOSIS — Z9181 History of falling: Secondary | ICD-10-CM | POA: Diagnosis not present

## 2022-05-19 DIAGNOSIS — I639 Cerebral infarction, unspecified: Secondary | ICD-10-CM | POA: Diagnosis not present

## 2022-05-19 NOTE — Progress Notes (Signed)
Location:  [place] Room Number: 122-W Place of Service:  SNF (31)   CODE STATUS: DNR  Allergies  Allergen Reactions   Septra Ds [Sulfamethoxazole-Trimethoprim]    Lisinopril Other (See Comments)    Cough    Chief Complaint  Patient presents with   Medical Management of Chronic Issues        History of CVA (cerebral vascular accident)  Atrial fibrillation/sick sinus syndrome;  Primary hypertension:  Gastroesophageal reflux disease without esophagitis    HPI:  She is a 88 year old long term resident of this facility being seen for the management of her chronic illnesses:   History of CVA (cerebral vascular accident)  Atrial fibrillation/sick sinus syndrome;  Primary hypertension:  Gastroesophageal reflux disease without esophagitis. There are no significant changes in her weight; her appetite is stable. There are no reports of heart burn.   Past Medical History:  Diagnosis Date   Acute metabolic encephalopathy 31/5176   Atrial fibrillation ([place])    Bleeding stomach ulcer 1980s?   CVA (cerebral vascular accident) ([place]) 07/2021   DIVERTICULOSIS, COLON 11/22/2006   Headache(784.0)    "very often; not regular" (11/25/2015)   Heart block    hx of   Hip fracture ([place]) 07/2021   History of blood transfusion 1980s   "when I had the bleeding ulcer"   HYPERLIPIDEMIA 11/22/2006   Hypertension    Melanoma ([place])    LLE   Menopausal syndrome (hot flashes)    OSTEOPOROSIS 11/22/2006   Presence of permanent cardiac pacemaker    Vitamin D deficiency 08/21/2021    Past Surgical History:  Procedure Laterality Date   APPENDECTOMY     BUNIONECTOMY Right    CATARACT EXTRACTION W/ INTRAOCULAR LENS  IMPLANT, BILATERAL Bilateral 2000s   CHOLECYSTECTOMY OPEN     EP IMPLANTABLE DEVICE N/A 11/25/2015   Procedure: Pacemaker Implant;  Surgeon: Will Meredith Leeds, MD;  Location: [facility];  Service: Cardiovascular;  Laterality: N/A;   FEMUR IM NAIL Left  07/08/2014   Procedure: INTRAMEDULLARY (IM) NAIL ;  Surgeon: Meredith Pel, MD;  Location: [facility];  Service: Orthopedics;  Laterality: Left;   FRACTURE SURGERY     HIP ARTHROPLASTY Right 08/20/2021   Procedure: ARTHROPLASTY BIPOLAR HIP (HEMIARTHROPLASTY);  Surgeon: Altamese Viola, MD;  Location: [facility];  Service: Orthopedics;  Laterality: Right;   IR KYPHO LUMBAR INC FX REDUCE BONE BX UNI/BIL CANNULATION INC/IMAGING  03/21/2021   PACEMAKER INSERTION     REVISION TOTAL HIP ARTHROPLASTY Left 06/2014    Social History   Socioeconomic History   Marital status: Widowed    Spouse name: Not on file   Number of children: 2   Years of education: 10   Highest education level: Not on file  Occupational History    Comment: retired  Tobacco Use   Smoking status: Never    Passive exposure: Never   Smokeless tobacco: Never  Vaping Use   Vaping Use: Never used  Substance and Sexual Activity   Alcohol use: No    Alcohol/week: 0.0 standard drinks of alcohol   Drug use: No   Sexual activity: Never  Other Topics Concern   Not on file  Social History Narrative   09/21/21 resides at [place] alone, son lives beside her   Caffeine - none   Social Determinants of Health   Financial Resource Strain: Low Risk  (05/13/2020)   Overall Financial Resource Strain (CARDIA)  Difficulty of Paying Living Expenses: Not hard at all  Food Insecurity: No Food Insecurity (05/13/2020)   Hunger Vital Sign    Worried About Running Out of Food in the Last Year: Never true    Ran Out of Food in the Last Year: Never true  Transportation Needs: No Transportation Needs (05/13/2020)   PRAPARE - Hydrologist (Medical): No    Lack of Transportation (Non-Medical): No  Physical Activity: Inactive (05/13/2020)   Exercise Vital Sign    Days of Exercise per Week: 0 days    Minutes of Exercise per Session: 0 min  Stress: No Stress Concern Present (05/13/2020)   [place]    Feeling of Stress : Not at all  Social Connections: Socially Isolated (05/13/2020)   Social Connection and Isolation Panel [NHANES]    Frequency of Communication with Friends and Family: More than three times a week    Frequency of Social Gatherings with Friends and Family: More than three times a week    Attends Religious Services: Never    Marine scientist or Organizations: No    Attends Archivist Meetings: Never    Marital Status: Widowed  Intimate Partner Violence: Not At Risk (05/13/2020)   Humiliation, Afraid, Rape, and Kick questionnaire    Fear of Current or Ex-Partner: No    Emotionally Abused: No    Physically Abused: No    Sexually Abused: No   Family History  Problem Relation Age of Onset   Alzheimer's disease Sister    Cardiomyopathy Brother       VITAL SIGNS BP (!) 120/50   Pulse 62   Temp 97.9 F (36.6 C)   Resp 20   Ht '5\' 5"'$  (1.651 m)   Wt 110 lb (49.9 kg)   SpO2 95%   BMI 18.30 kg/m   Outpatient Encounter Medications as of 05/19/2022  Medication Sig   Acetaminophen (TYLENOL ARTHRITIS PAIN PO) Take 650 mg by mouth every 6 (six) hours.   amiodarone (PACERONE) 200 MG tablet Take 1 tablet (200 mg total) by mouth daily.   aspirin 81 MG chewable tablet Chew 1 tablet (81 mg total) by mouth daily.   feeding supplement (BOOST HIGH PROTEIN) LIQD Take 1 Container by mouth in the morning and at bedtime. 0.06 gram- 1 kcal/mL; amt: 237m   nitrofurantoin, macrocrystal-monohydrate, (MACROBID) 100 MG capsule Take 100 mg by mouth at bedtime. UTI prophylaxis   NON FORMULARY Diet - Dysphagia 3   senna-docusate (SENNA S) 8.6-50 MG tablet Take 1 tablet by mouth 2 (two) times daily.   Balsam [place]-Castor Oil ([facility] OINT Special Instructions: apply to DTI Right & Left medial buttock Every Shift; Day (Patient not taking: Reported on 05/19/2022)   magnesium hydroxide (MILK OF MAGNESIA)  400 MG/5ML suspension Take 30 mLs by mouth daily as needed for mild constipation. (Patient not taking: Reported on 05/19/2022)   pantoprazole (PROTONIX) 40 MG tablet Take 40 mg by mouth daily. (Patient not taking: Reported on 05/19/2022)   [DISCONTINUED] senna (SENOKOT) 8.6 MG TABS tablet Take 1 tablet by mouth in the morning and at bedtime.   No facility-administered encounter medications on file as of 05/19/2022.     SIGNIFICANT DIAGNOSTIC EXAMS  PREVIOUS   08-17-21: ct head stroke:  1. No acute CT finding. Atrophy and chronic small-vessel ischemic changes. 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 2. ASPECTS is 10, allowing for the old infarction  08-17-21:  ct angio of head:  1. Decreased perfusion (Tmax> 6 seconds) in the posterior left MCA territory, which correlates with an area with decreased contrast enhancement in the MCA branches. While there is mild to moderate narrowing in the distal left M1 segment, no additional focal stenosis or occlusion is seen in the left MCA branches. No infarct core. 2. Multifocal narrowing in the bilateral PCAs. No other hemodynamically significant intracranial stenosis. 3. Approximately 50% proximal left ICA stenosis, unchanged. No additional hemodynamically significant stenosis in the neck.  08-17-21: right hip x-ray: Acute displaced right femoral neck fracture.   08-18-21: MRI of brain 1. Moderately motion limited study with evidence of acute left frontal and parietal infarcts. The left parietal infarct is along the posterior aspect of a remote left parietal infarct. 2. Moderate chronic microvascular ischemic disease. 3.  Cerebral atrophy   01-04-22: dexa scan: t score -4.184   NO NEW EXAMS   LABS REVIEWED; PREVIOUS   08-17-21: wbc 19.6 hgb 14.3; hct 45.2 mcv 94.8 plt 209; glucose 144; bun 15; creat 1.22 k+ 3.8; na++ 137; ca 9.1; GFR 42; protein 6.7; albumin 3.8 hgb a1c 5.7 08-18-21: urine culture <10,000. Vitamin B 12: 232; tsh 1.864 08-21-21: vit D  12.78 08-23-21: vitamin B 12: 204; iron 22; tibc 185; ferritin 234;  08-24-21: wbc 9.2; hgb 9.1; hct 27.0; mcv 90.0 plt 154; glucose 104; bun 17; creat 0.76; k + 3.8; na++ 136; ca 8.1; GFR >60 09-30-21: tsh 4.221 10-18-21: urine culture: 40,000 colonies: citrobacter freundii  01-16-22; glucose 62; bun 19; creat 0.91; k+ 4.6; na++ 139; ca 8.7; gfr 60; vitamin D 29.35   NO NEW LABS.    Review of Systems  Constitutional:  Negative for malaise/fatigue.  Respiratory:  Negative for cough and shortness of breath.   Cardiovascular:  Negative for chest pain, palpitations and leg swelling.  Gastrointestinal:  Negative for abdominal pain, constipation and heartburn.  Musculoskeletal:  Positive for back pain. Negative for joint pain and myalgias.  Skin: Negative.   Neurological:  Negative for dizziness.  Psychiatric/Behavioral:  The patient is not nervous/anxious.     Physical Exam Constitutional:      General: She is not in acute distress.    Appearance: She is well-developed. She is not diaphoretic.  Neck:     Thyroid: No thyromegaly.  Cardiovascular:     Rate and Rhythm: Normal rate and regular rhythm.     Pulses: Normal pulses.     Heart sounds: Murmur heard.  Pulmonary:     Effort: Pulmonary effort is normal. No respiratory distress.     Breath sounds: Normal breath sounds.  Abdominal:     General: Bowel sounds are normal. There is no distension.     Palpations: Abdomen is soft.     Tenderness: There is no abdominal tenderness.  Musculoskeletal:        General: Normal range of motion.     Cervical back: Neck supple.     Right lower leg: No edema.     Left lower leg: No edema.  Lymphadenopathy:     Cervical: No cervical adenopathy.  Skin:    General: Skin is warm and dry.  Neurological:     Mental Status: She is alert. Mental status is at baseline.  Psychiatric:        Mood and Affect: Mood normal.     ASSESSMENT/ PLAN:   TODAY  History of CVA (cerebral vascular accident)  will continue asa 81 mg daily   2. Atrial  fibrillation/sick sinus syndrome; will continue amiodarone 200 mg daily; asa 81 mg daily   3. Primary hypertension: b/p 150/78: will monitor   4. Gastroesophageal reflux disease without esophagitis: will continue to monitor    PREVIOUS   5. Chronic kidney disease stage 3b: bun 19; creat 0.91; gfr 60  6. Acute post operative anemia due to expected blood loss: hgb 9.1 will monitor   7. Mixed hyperlipidemia: is off lipitor   8. Vitamin D deficiency: level: 29.35 will monitor   9. Vitamin B 12 deficiency: level 204 will continue to monitor  10. Aortic atherosclerosis (ct 08/02/21) will continue asa 81 mg daily; off statin due to advanced age.   11. Post menopausal osteoporosis t score -4.184 will continue to monitor   12. Protein calorie malnutrition: will continue boost twice daily            Ok Edwards NP [facility]  call [phone]

## 2022-05-20 DIAGNOSIS — Z9181 History of falling: Secondary | ICD-10-CM | POA: Diagnosis not present

## 2022-05-20 DIAGNOSIS — R262 Difficulty in walking, not elsewhere classified: Secondary | ICD-10-CM | POA: Diagnosis not present

## 2022-05-20 DIAGNOSIS — I639 Cerebral infarction, unspecified: Secondary | ICD-10-CM | POA: Diagnosis not present

## 2022-05-20 DIAGNOSIS — M6281 Muscle weakness (generalized): Secondary | ICD-10-CM | POA: Diagnosis not present

## 2022-05-22 DIAGNOSIS — Z9181 History of falling: Secondary | ICD-10-CM | POA: Diagnosis not present

## 2022-05-22 DIAGNOSIS — M6281 Muscle weakness (generalized): Secondary | ICD-10-CM | POA: Diagnosis not present

## 2022-05-22 DIAGNOSIS — I639 Cerebral infarction, unspecified: Secondary | ICD-10-CM | POA: Diagnosis not present

## 2022-05-22 DIAGNOSIS — R262 Difficulty in walking, not elsewhere classified: Secondary | ICD-10-CM | POA: Diagnosis not present

## 2022-05-23 DIAGNOSIS — I639 Cerebral infarction, unspecified: Secondary | ICD-10-CM | POA: Diagnosis not present

## 2022-05-23 DIAGNOSIS — M6281 Muscle weakness (generalized): Secondary | ICD-10-CM | POA: Diagnosis not present

## 2022-05-23 DIAGNOSIS — Z9181 History of falling: Secondary | ICD-10-CM | POA: Diagnosis not present

## 2022-05-23 DIAGNOSIS — R262 Difficulty in walking, not elsewhere classified: Secondary | ICD-10-CM | POA: Diagnosis not present

## 2022-05-24 DIAGNOSIS — Z9181 History of falling: Secondary | ICD-10-CM | POA: Diagnosis not present

## 2022-05-24 DIAGNOSIS — R262 Difficulty in walking, not elsewhere classified: Secondary | ICD-10-CM | POA: Diagnosis not present

## 2022-05-24 DIAGNOSIS — M6281 Muscle weakness (generalized): Secondary | ICD-10-CM | POA: Diagnosis not present

## 2022-05-24 DIAGNOSIS — I639 Cerebral infarction, unspecified: Secondary | ICD-10-CM | POA: Diagnosis not present

## 2022-05-25 DIAGNOSIS — Z9181 History of falling: Secondary | ICD-10-CM | POA: Diagnosis not present

## 2022-05-25 DIAGNOSIS — M6281 Muscle weakness (generalized): Secondary | ICD-10-CM | POA: Diagnosis not present

## 2022-05-25 DIAGNOSIS — I639 Cerebral infarction, unspecified: Secondary | ICD-10-CM | POA: Diagnosis not present

## 2022-05-25 DIAGNOSIS — R262 Difficulty in walking, not elsewhere classified: Secondary | ICD-10-CM | POA: Diagnosis not present

## 2022-05-26 DIAGNOSIS — R262 Difficulty in walking, not elsewhere classified: Secondary | ICD-10-CM | POA: Diagnosis not present

## 2022-05-26 DIAGNOSIS — Z9181 History of falling: Secondary | ICD-10-CM | POA: Diagnosis not present

## 2022-05-26 DIAGNOSIS — M6281 Muscle weakness (generalized): Secondary | ICD-10-CM | POA: Diagnosis not present

## 2022-05-26 DIAGNOSIS — I639 Cerebral infarction, unspecified: Secondary | ICD-10-CM | POA: Diagnosis not present

## 2022-05-29 DIAGNOSIS — I639 Cerebral infarction, unspecified: Secondary | ICD-10-CM | POA: Diagnosis not present

## 2022-05-29 DIAGNOSIS — R262 Difficulty in walking, not elsewhere classified: Secondary | ICD-10-CM | POA: Diagnosis not present

## 2022-05-29 DIAGNOSIS — M6281 Muscle weakness (generalized): Secondary | ICD-10-CM | POA: Diagnosis not present

## 2022-05-29 DIAGNOSIS — Z9181 History of falling: Secondary | ICD-10-CM | POA: Diagnosis not present

## 2022-05-30 DIAGNOSIS — M6281 Muscle weakness (generalized): Secondary | ICD-10-CM | POA: Diagnosis not present

## 2022-05-30 DIAGNOSIS — R262 Difficulty in walking, not elsewhere classified: Secondary | ICD-10-CM | POA: Diagnosis not present

## 2022-05-30 DIAGNOSIS — Z9181 History of falling: Secondary | ICD-10-CM | POA: Diagnosis not present

## 2022-05-30 DIAGNOSIS — I639 Cerebral infarction, unspecified: Secondary | ICD-10-CM | POA: Diagnosis not present

## 2022-05-31 DIAGNOSIS — I639 Cerebral infarction, unspecified: Secondary | ICD-10-CM | POA: Diagnosis not present

## 2022-05-31 DIAGNOSIS — Z9181 History of falling: Secondary | ICD-10-CM | POA: Diagnosis not present

## 2022-05-31 DIAGNOSIS — R262 Difficulty in walking, not elsewhere classified: Secondary | ICD-10-CM | POA: Diagnosis not present

## 2022-05-31 DIAGNOSIS — M6281 Muscle weakness (generalized): Secondary | ICD-10-CM | POA: Diagnosis not present

## 2022-06-01 DIAGNOSIS — I639 Cerebral infarction, unspecified: Secondary | ICD-10-CM | POA: Diagnosis not present

## 2022-06-01 DIAGNOSIS — Z9181 History of falling: Secondary | ICD-10-CM | POA: Diagnosis not present

## 2022-06-01 DIAGNOSIS — M6281 Muscle weakness (generalized): Secondary | ICD-10-CM | POA: Diagnosis not present

## 2022-06-01 DIAGNOSIS — R262 Difficulty in walking, not elsewhere classified: Secondary | ICD-10-CM | POA: Diagnosis not present

## 2022-06-05 DIAGNOSIS — M6281 Muscle weakness (generalized): Secondary | ICD-10-CM | POA: Diagnosis not present

## 2022-06-05 DIAGNOSIS — Z9181 History of falling: Secondary | ICD-10-CM | POA: Diagnosis not present

## 2022-06-05 DIAGNOSIS — I639 Cerebral infarction, unspecified: Secondary | ICD-10-CM | POA: Diagnosis not present

## 2022-06-05 DIAGNOSIS — R262 Difficulty in walking, not elsewhere classified: Secondary | ICD-10-CM | POA: Diagnosis not present

## 2022-06-06 DIAGNOSIS — R262 Difficulty in walking, not elsewhere classified: Secondary | ICD-10-CM | POA: Diagnosis not present

## 2022-06-06 DIAGNOSIS — I639 Cerebral infarction, unspecified: Secondary | ICD-10-CM | POA: Diagnosis not present

## 2022-06-06 DIAGNOSIS — Z9181 History of falling: Secondary | ICD-10-CM | POA: Diagnosis not present

## 2022-06-06 DIAGNOSIS — M6281 Muscle weakness (generalized): Secondary | ICD-10-CM | POA: Diagnosis not present

## 2022-06-07 DIAGNOSIS — R262 Difficulty in walking, not elsewhere classified: Secondary | ICD-10-CM | POA: Diagnosis not present

## 2022-06-07 DIAGNOSIS — M6281 Muscle weakness (generalized): Secondary | ICD-10-CM | POA: Diagnosis not present

## 2022-06-07 DIAGNOSIS — Z9181 History of falling: Secondary | ICD-10-CM | POA: Diagnosis not present

## 2022-06-07 DIAGNOSIS — I639 Cerebral infarction, unspecified: Secondary | ICD-10-CM | POA: Diagnosis not present

## 2022-06-08 DIAGNOSIS — I639 Cerebral infarction, unspecified: Secondary | ICD-10-CM | POA: Diagnosis not present

## 2022-06-08 DIAGNOSIS — R262 Difficulty in walking, not elsewhere classified: Secondary | ICD-10-CM | POA: Diagnosis not present

## 2022-06-08 DIAGNOSIS — Z9181 History of falling: Secondary | ICD-10-CM | POA: Diagnosis not present

## 2022-06-08 DIAGNOSIS — M6281 Muscle weakness (generalized): Secondary | ICD-10-CM | POA: Diagnosis not present

## 2022-06-12 ENCOUNTER — Non-Acute Institutional Stay (SKILLED_NURSING_FACILITY): Payer: Medicare Other | Admitting: Adult Health

## 2022-06-12 ENCOUNTER — Encounter: Payer: Self-pay | Admitting: Adult Health

## 2022-06-12 DIAGNOSIS — E782 Mixed hyperlipidemia: Secondary | ICD-10-CM | POA: Diagnosis not present

## 2022-06-12 DIAGNOSIS — M4186 Other forms of scoliosis, lumbar region: Secondary | ICD-10-CM | POA: Diagnosis not present

## 2022-06-12 DIAGNOSIS — M6281 Muscle weakness (generalized): Secondary | ICD-10-CM | POA: Diagnosis not present

## 2022-06-12 DIAGNOSIS — N1832 Chronic kidney disease, stage 3b: Secondary | ICD-10-CM | POA: Diagnosis not present

## 2022-06-12 DIAGNOSIS — R262 Difficulty in walking, not elsewhere classified: Secondary | ICD-10-CM | POA: Diagnosis not present

## 2022-06-12 DIAGNOSIS — D631 Anemia in chronic kidney disease: Secondary | ICD-10-CM

## 2022-06-12 DIAGNOSIS — I639 Cerebral infarction, unspecified: Secondary | ICD-10-CM | POA: Diagnosis not present

## 2022-06-12 DIAGNOSIS — Z9181 History of falling: Secondary | ICD-10-CM | POA: Diagnosis not present

## 2022-06-12 DIAGNOSIS — M533 Sacrococcygeal disorders, not elsewhere classified: Secondary | ICD-10-CM | POA: Diagnosis not present

## 2022-06-12 NOTE — Progress Notes (Signed)
Location:  [facility] Room Number: NO/122/W Place of Service:  SNF (31) Ok Edwards S.,NP  CODE STATUS: DNR  Allergies  Allergen Reactions   Septra Ds [Sulfamethoxazole-Trimethoprim]    Lisinopril Other (See Comments)    Cough    Chief Complaint  Patient presents with   Medical Management of Chronic Issues                Chronic kidney disease stage 3b: Anemia due to chronic kidney disease: Mixed hyperlipidemia    HPI:  She is a 89 year old long term resident of this facility being seen for the management of her chronic illnesses: Chronic kidney disease stage 3b: Anemia due to chronic kidney disease: Mixed hyperlipidemia. She has chronic back pain. Her weight is stable; her po intake is stable .  Past Medical History:  Diagnosis Date   Acute metabolic encephalopathy 50/0938   Atrial fibrillation ([place])    Bleeding stomach ulcer 1980s?   CVA (cerebral vascular accident) ([place]) 07/2021   DIVERTICULOSIS, COLON 11/22/2006   Headache(784.0)    "very often; not regular" (11/25/2015)   Heart block    hx of   Hip fracture ([place]) 07/2021   History of blood transfusion 1980s   "when I had the bleeding ulcer"   HYPERLIPIDEMIA 11/22/2006   Hypertension    Melanoma ([place])    LLE   Menopausal syndrome (hot flashes)    OSTEOPOROSIS 11/22/2006   Presence of permanent cardiac pacemaker    Vitamin D deficiency 08/21/2021    Past Surgical History:  Procedure Laterality Date   APPENDECTOMY     BUNIONECTOMY Right    CATARACT EXTRACTION W/ INTRAOCULAR LENS  IMPLANT, BILATERAL Bilateral 2000s   CHOLECYSTECTOMY OPEN     EP IMPLANTABLE DEVICE N/A 11/25/2015   Procedure: Pacemaker Implant;  Surgeon: Will Meredith Leeds, MD;  Location: [facility];  Service: Cardiovascular;  Laterality: N/A;   FEMUR IM NAIL Left 07/08/2014   Procedure: INTRAMEDULLARY (IM) NAIL ;  Surgeon: Meredith Pel, MD;  Location: [facility];  Service: Orthopedics;  Laterality: Left;    FRACTURE SURGERY     HIP ARTHROPLASTY Right 08/20/2021   Procedure: ARTHROPLASTY BIPOLAR HIP (HEMIARTHROPLASTY);  Surgeon: Altamese Roseland, MD;  Location: [facility];  Service: Orthopedics;  Laterality: Right;   IR KYPHO LUMBAR INC FX REDUCE BONE BX UNI/BIL CANNULATION INC/IMAGING  03/21/2021   PACEMAKER INSERTION     REVISION TOTAL HIP ARTHROPLASTY Left 06/2014    Social History   Socioeconomic History   Marital status: Widowed    Spouse name: Not on file   Number of children: 2   Years of education: 67   Highest education level: Not on file  Occupational History    Comment: retired  Tobacco Use   Smoking status: Never    Passive exposure: Never   Smokeless tobacco: Never  Vaping Use   Vaping Use: Never used  Substance and Sexual Activity   Alcohol use: No    Alcohol/week: 0.0 standard drinks of alcohol   Drug use: No   Sexual activity: Never  Other Topics Concern   Not on file  Social History Narrative   09/21/21 resides at [place] alone, son lives beside her   Caffeine - none   Social Determinants of Health   Financial Resource Strain: Low Risk  (05/13/2020)   Overall Financial Resource Strain (CARDIA)    Difficulty of Paying Living Expenses: Not hard at all  Food Insecurity: No Food Insecurity (05/13/2020)   Hunger Vital Sign    Worried About Running Out of Food in the Last Year: Never true    Ran Out of Food in the Last Year: Never true  Transportation Needs: No Transportation Needs (05/13/2020)   PRAPARE - Hydrologist (Medical): No    Lack of Transportation (Non-Medical): No  Physical Activity: Inactive (05/13/2020)   Exercise Vital Sign    Days of Exercise per Week: 0 days    Minutes of Exercise per Session: 0 min  Stress: No Stress Concern Present (05/13/2020)   [place]    Feeling of Stress : Not at all  Social Connections: Socially Isolated  (05/13/2020)   Social Connection and Isolation Panel [NHANES]    Frequency of Communication with Friends and Family: More than three times a week    Frequency of Social Gatherings with Friends and Family: More than three times a week    Attends Religious Services: Never    Marine scientist or Organizations: No    Attends Archivist Meetings: Never    Marital Status: Widowed  Intimate Partner Violence: Not At Risk (05/13/2020)   Humiliation, Afraid, Rape, and Kick questionnaire    Fear of Current or Ex-Partner: No    Emotionally Abused: No    Physically Abused: No    Sexually Abused: No   Family History  Problem Relation Age of Onset   Alzheimer's disease Sister    Cardiomyopathy Brother       VITAL SIGNS BP (!) 124/52   Pulse 63   Temp (!) 97.2 F (36.2 C)   Resp 20   Ht '5\' 5"'$  (1.651 m)   Wt 109 lb 9.6 oz (49.7 kg)   SpO2 93%   BMI 18.24 kg/m   Outpatient Encounter Medications as of 06/12/2022  Medication Sig   Acetaminophen (TYLENOL ARTHRITIS PAIN PO) Take 650 mg by mouth every 6 (six) hours.   amiodarone (PACERONE) 200 MG tablet Take 1 tablet (200 mg total) by mouth daily.   aspirin 81 MG chewable tablet Chew 1 tablet (81 mg total) by mouth daily.   feeding supplement (BOOST HIGH PROTEIN) LIQD Take 1 Container by mouth in the morning and at bedtime. 0.06 gram- 1 kcal/mL; amt: 280m   nitrofurantoin, macrocrystal-monohydrate, (MACROBID) 100 MG capsule Take 100 mg by mouth at bedtime. UTI prophylaxis   NON FORMULARY Diet - Dysphagia 3   senna-docusate (SENNA S) 8.6-50 MG tablet Take 1 tablet by mouth 2 (two) times daily.   [DISCONTINUED] Balsam [place]-Castor Oil ([facility] OINT Special Instructions: apply to DTI Right & Left medial buttock Every Shift; Day (Patient not taking: Reported on 05/19/2022)   [DISCONTINUED] magnesium hydroxide (MILK OF MAGNESIA) 400 MG/5ML suspension Take 30 mLs by mouth daily as needed for mild constipation. (Patient not taking:  Reported on 05/19/2022)   [DISCONTINUED] pantoprazole (PROTONIX) 40 MG tablet Take 40 mg by mouth daily. (Patient not taking: Reported on 05/19/2022)   No facility-administered encounter medications on file as of 06/12/2022.     SIGNIFICANT DIAGNOSTIC EXAMS  PREVIOUS   08-17-21: ct head stroke:  1. No acute CT finding. Atrophy and chronic small-vessel ischemic changes. 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 2. ASPECTS is 10, allowing for the old infarction  08-17-21: ct angio of head:  1. Decreased perfusion (Tmax> 6 seconds) in the posterior left MCA territory, which correlates with an area with decreased  contrast enhancement in the MCA branches. While there is mild to moderate narrowing in the distal left M1 segment, no additional focal stenosis or occlusion is seen in the left MCA branches. No infarct core. 2. Multifocal narrowing in the bilateral PCAs. No other hemodynamically significant intracranial stenosis. 3. Approximately 50% proximal left ICA stenosis, unchanged. No additional hemodynamically significant stenosis in the neck.  08-17-21: right hip x-ray: Acute displaced right femoral neck fracture.   08-18-21: MRI of brain 1. Moderately motion limited study with evidence of acute left frontal and parietal infarcts. The left parietal infarct is along the posterior aspect of a remote left parietal infarct. 2. Moderate chronic microvascular ischemic disease. 3.  Cerebral atrophy   01-04-22: dexa scan: t score -4.184   NO NEW EXAMS   LABS REVIEWED; PREVIOUS   08-17-21: wbc 19.6 hgb 14.3; hct 45.2 mcv 94.8 plt 209; glucose 144; bun 15; creat 1.22 k+ 3.8; na++ 137; ca 9.1; GFR 42; protein 6.7; albumin 3.8 hgb a1c 5.7 08-18-21: urine culture <10,000. Vitamin B 12: 232; tsh 1.864 08-21-21: vit D 12.78 08-23-21: vitamin B 12: 204; iron 22; tibc 185; ferritin 234;  08-24-21: wbc 9.2; hgb 9.1; hct 27.0; mcv 90.0 plt 154; glucose 104; bun 17; creat 0.76; k + 3.8; na++ 136; ca 8.1; GFR  >60 09-30-21: tsh 4.221 10-18-21: urine culture: 40,000 colonies: citrobacter freundii  01-16-22; glucose 62; bun 19; creat 0.91; k+ 4.6; na++ 139; ca 8.7; gfr 60; vitamin D 29.35   TODAY  04-20-22: wbc 5.9; hgb 12.4; hct 39.8; mcv 97.5 plt 204; glucose 86; bun 14; creat 0.83; k+ 4.0; na++ 140; ca 8.4; gfr >60; protein 6.3; albumin 3.4; tsh 3.839; iron 58; tibc 304; vitamin D 45.06      Review of Systems  Constitutional:  Negative for malaise/fatigue.  Respiratory:  Negative for cough and shortness of breath.   Cardiovascular:  Negative for chest pain, palpitations and leg swelling.  Gastrointestinal:  Negative for abdominal pain, constipation and heartburn.  Musculoskeletal:  Positive for back pain. Negative for joint pain and myalgias.  Skin: Negative.   Neurological:  Negative for dizziness.  Psychiatric/Behavioral:  The patient is not nervous/anxious.    Physical Exam Constitutional:      General: She is not in acute distress.    Appearance: She is underweight. She is not diaphoretic.  Neck:     Thyroid: No thyromegaly.  Cardiovascular:     Rate and Rhythm: Normal rate and regular rhythm.     Pulses: Normal pulses.     Heart sounds: Murmur heard.  Pulmonary:     Effort: Pulmonary effort is normal. No respiratory distress.     Breath sounds: Normal breath sounds.  Abdominal:     General: Bowel sounds are normal. There is no distension.     Palpations: Abdomen is soft.     Tenderness: There is no abdominal tenderness.  Musculoskeletal:        General: Normal range of motion.     Cervical back: Neck supple.     Right lower leg: No edema.     Left lower leg: No edema.  Lymphadenopathy:     Cervical: No cervical adenopathy.  Skin:    General: Skin is warm and dry.  Neurological:     Mental Status: She is alert. Mental status is at baseline.  Psychiatric:        Mood and Affect: Mood normal.       ASSESSMENT/ PLAN:  TODAY  Chronic kidney  disease stage 3b: bun 14;  creat 0.83; gfr>60  2. Anemia due to chronic kidney disease: hgb 12.4   3. Mixed hyperlipidemia: is off lipitor due to advanced age.   PREVIOUS   4. Vitamin D deficiency: level: 45.06 will monitor   5. Vitamin B 12 deficiency: level 204 will continue to monitor  6. Aortic atherosclerosis (ct 08/02/21) will continue asa 81 mg daily; off statin due to advanced age.   7. Post menopausal osteoporosis t score -4.184 will continue to monitor   8. Protein calorie malnutrition: will continue boost twice daily   9. History of CVA (cerebral vascular accident) will continue asa 81 mg daily   10. Atrial fibrillation/sick sinus syndrome; will continue amiodarone 200 mg daily; asa 81 mg daily   11 Primary hypertension: b/p 124/52: will monitor   12. Gastroesophageal reflux disease without esophagitis: will continue to monitor    Ok Edwards NP [facility] Adult [facility]   call [phone]

## 2022-06-13 DIAGNOSIS — R262 Difficulty in walking, not elsewhere classified: Secondary | ICD-10-CM | POA: Diagnosis not present

## 2022-06-13 DIAGNOSIS — I639 Cerebral infarction, unspecified: Secondary | ICD-10-CM | POA: Diagnosis not present

## 2022-06-13 DIAGNOSIS — M6281 Muscle weakness (generalized): Secondary | ICD-10-CM | POA: Diagnosis not present

## 2022-06-13 DIAGNOSIS — Z9181 History of falling: Secondary | ICD-10-CM | POA: Diagnosis not present

## 2022-06-14 DIAGNOSIS — I639 Cerebral infarction, unspecified: Secondary | ICD-10-CM | POA: Diagnosis not present

## 2022-06-14 DIAGNOSIS — R262 Difficulty in walking, not elsewhere classified: Secondary | ICD-10-CM | POA: Diagnosis not present

## 2022-06-14 DIAGNOSIS — M6281 Muscle weakness (generalized): Secondary | ICD-10-CM | POA: Diagnosis not present

## 2022-06-14 DIAGNOSIS — Z9181 History of falling: Secondary | ICD-10-CM | POA: Diagnosis not present

## 2022-06-15 DIAGNOSIS — I639 Cerebral infarction, unspecified: Secondary | ICD-10-CM | POA: Diagnosis not present

## 2022-06-15 DIAGNOSIS — M6281 Muscle weakness (generalized): Secondary | ICD-10-CM | POA: Diagnosis not present

## 2022-06-15 DIAGNOSIS — R262 Difficulty in walking, not elsewhere classified: Secondary | ICD-10-CM | POA: Diagnosis not present

## 2022-06-15 DIAGNOSIS — Z9181 History of falling: Secondary | ICD-10-CM | POA: Diagnosis not present

## 2022-06-19 DIAGNOSIS — N1832 Chronic kidney disease, stage 3b: Secondary | ICD-10-CM | POA: Insufficient documentation

## 2022-07-05 ENCOUNTER — Encounter: Payer: Self-pay | Admitting: Adult Health

## 2022-07-05 ENCOUNTER — Non-Acute Institutional Stay (SKILLED_NURSING_FACILITY): Payer: Medicare Other | Admitting: Adult Health

## 2022-07-05 DIAGNOSIS — I700 Atherosclerosis of aorta: Secondary | ICD-10-CM

## 2022-07-05 DIAGNOSIS — E559 Vitamin D deficiency, unspecified: Secondary | ICD-10-CM | POA: Diagnosis not present

## 2022-07-05 DIAGNOSIS — E538 Deficiency of other specified B group vitamins: Secondary | ICD-10-CM | POA: Diagnosis not present

## 2022-07-05 NOTE — Progress Notes (Signed)
Location:  [facility] Room Number: 3 D Place of Service:  SNF (31)   CODE STATUS: DNR  Allergies  Allergen Reactions   Septra Ds [Sulfamethoxazole-Trimethoprim]    Lisinopril Other (See Comments)    Cough    Chief Complaint  Patient presents with   Medical Management of Chronic Issues                       Vitamin D deficiency:  Vitamin B 12 deficiency: Aortic atherosclerosis     HPI:  She is a 89 year old long term resident of this facility being seen for the management of her chronic illnesses: Vitamin D deficiency:  Vitamin B 12 deficiency: Aortic atherosclerosis. There are no reports of uncontrolled pain. There are no reports of anxiety or depressive thoughts.   Past Medical History:  Diagnosis Date   Acute metabolic encephalopathy 0000000   Atrial fibrillation ([place])    Bleeding stomach ulcer 1980s?   CVA (cerebral vascular accident) ([place]) 07/2021   DIVERTICULOSIS, COLON 11/22/2006   Headache(784.0)    "very often; not regular" (11/25/2015)   Heart block    hx of   Hip fracture ([place]) 07/2021   History of blood transfusion 1980s   "when I had the bleeding ulcer"   HYPERLIPIDEMIA 11/22/2006   Hypertension    Melanoma ([place])    LLE   Menopausal syndrome (hot flashes)    OSTEOPOROSIS 11/22/2006   Presence of permanent cardiac pacemaker    Vitamin D deficiency 08/21/2021    Past Surgical History:  Procedure Laterality Date   APPENDECTOMY     BUNIONECTOMY Right    CATARACT EXTRACTION W/ INTRAOCULAR LENS  IMPLANT, BILATERAL Bilateral 2000s   CHOLECYSTECTOMY OPEN     EP IMPLANTABLE DEVICE N/A 11/25/2015   Procedure: Pacemaker Implant;  Surgeon: Will Meredith Leeds, MD;  Location: [facility];  Service: Cardiovascular;  Laterality: N/A;   FEMUR IM NAIL Left 07/08/2014   Procedure: INTRAMEDULLARY (IM) NAIL ;  Surgeon: Meredith Pel, MD;  Location: [facility];  Service: Orthopedics;  Laterality: Left;   FRACTURE SURGERY     HIP  ARTHROPLASTY Right 08/20/2021   Procedure: ARTHROPLASTY BIPOLAR HIP (HEMIARTHROPLASTY);  Surgeon: Altamese [place], MD;  Location: [facility];  Service: Orthopedics;  Laterality: Right;   IR KYPHO LUMBAR INC FX REDUCE BONE BX UNI/BIL CANNULATION INC/IMAGING  03/21/2021   PACEMAKER INSERTION     REVISION TOTAL HIP ARTHROPLASTY Left 06/2014    Social History   Socioeconomic History   Marital status: Widowed    Spouse name: Not on file   Number of children: 2   Years of education: 25   Highest education level: Not on file  Occupational History    Comment: retired  Tobacco Use   Smoking status: Never    Passive exposure: Never   Smokeless tobacco: Never  Vaping Use   Vaping Use: Never used  Substance and Sexual Activity   Alcohol use: No    Alcohol/week: 0.0 standard drinks of alcohol   Drug use: No   Sexual activity: Never  Other Topics Concern   Not on file  Social History Narrative   09/21/21 resides at [facility] alone, son lives beside her   Caffeine - none   Social Determinants of Health   Financial Resource Strain: Low Risk  (05/13/2020)   Overall Financial Resource Strain (CARDIA)    Difficulty of Paying Living Expenses:  Not hard at all  Food Insecurity: No Food Insecurity (05/13/2020)   Hunger Vital Sign    Worried About Running Out of Food in the Last Year: Never true    Ran Out of Food in the Last Year: Never true  Transportation Needs: No Transportation Needs (05/13/2020)   PRAPARE - Hydrologist (Medical): No    Lack of Transportation (Non-Medical): No  Physical Activity: Inactive (05/13/2020)   Exercise Vital Sign    Days of Exercise per Week: 0 days    Minutes of Exercise per Session: 0 min  Stress: No Stress Concern Present (05/13/2020)   [place]    Feeling of Stress : Not at all  Social Connections: Socially Isolated (05/13/2020)   Social Connection  and Isolation Panel [NHANES]    Frequency of Communication with Friends and Family: More than three times a week    Frequency of Social Gatherings with Friends and Family: More than three times a week    Attends Religious Services: Never    Marine scientist or Organizations: No    Attends Archivist Meetings: Never    Marital Status: Widowed  Intimate Partner Violence: Not At Risk (05/13/2020)   Humiliation, Afraid, Rape, and Kick questionnaire    Fear of Current or Ex-Partner: No    Emotionally Abused: No    Physically Abused: No    Sexually Abused: No   Family History  Problem Relation Age of Onset   Alzheimer's disease Sister    Cardiomyopathy Brother       VITAL SIGNS BP 110/81   Pulse 75   Ht 5' 5"$  (1.651 m)   Wt 109 lb 3.2 oz (49.5 kg)   BMI 18.17 kg/m   Outpatient Encounter Medications as of 07/05/2022  Medication Sig   Acetaminophen (TYLENOL ARTHRITIS PAIN PO) Take 650 mg by mouth every 6 (six) hours.   amiodarone (PACERONE) 200 MG tablet Take 1 tablet (200 mg total) by mouth daily.   aspirin 81 MG chewable tablet Chew 1 tablet (81 mg total) by mouth daily.   Camphor-Menthol-Methyl Sal (SALONPAS) 3.05-27-08 % PTCH Apply 1 patch topically daily. At 9 am   feeding supplement (BOOST HIGH PROTEIN) LIQD Take 1 Container by mouth in the morning and at bedtime. 0.06 gram- 1 kcal/mL; amt: 256m   methocarbamol (ROBAXIN) 500 MG tablet Take 500 mg by mouth every 6 (six) hours as needed for muscle spasms.   nitrofurantoin, macrocrystal-monohydrate, (MACROBID) 100 MG capsule Take 100 mg by mouth at bedtime. UTI prophylaxis   NON FORMULARY Diet - Dysphagia 3   senna-docusate (SENNA S) 8.6-50 MG tablet Take 1 tablet by mouth 2 (two) times daily.   No facility-administered encounter medications on file as of 07/05/2022.     SIGNIFICANT DIAGNOSTIC EXAMS  PREVIOUS   08-17-21: ct head stroke:  1. No acute CT finding. Atrophy and chronic small-vessel ischemic  changes. 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 2. ASPECTS is 10, allowing for the old infarction  08-17-21: ct angio of head:  1. Decreased perfusion (Tmax> 6 seconds) in the posterior left MCA territory, which correlates with an area with decreased contrast enhancement in the MCA branches. While there is mild to moderate narrowing in the distal left M1 segment, no additional focal stenosis or occlusion is seen in the left MCA branches. No infarct core. 2. Multifocal narrowing in the bilateral PCAs. No other hemodynamically significant intracranial stenosis. 3. Approximately  50% proximal left ICA stenosis, unchanged. No additional hemodynamically significant stenosis in the neck.  08-17-21: right hip x-ray: Acute displaced right femoral neck fracture.   08-18-21: MRI of brain 1. Moderately motion limited study with evidence of acute left frontal and parietal infarcts. The left parietal infarct is along the posterior aspect of a remote left parietal infarct. 2. Moderate chronic microvascular ischemic disease. 3.  Cerebral atrophy   01-04-22: dexa scan: t score -4.184   NO NEW EXAMS   LABS REVIEWED; PREVIOUS   08-17-21: wbc 19.6 hgb 14.3; hct 45.2 mcv 94.8 plt 209; glucose 144; bun 15; creat 1.22 k+ 3.8; na++ 137; ca 9.1; GFR 42; protein 6.7; albumin 3.8 hgb a1c 5.7 08-18-21: urine culture <10,000. Vitamin B 12: 232; tsh 1.864 08-21-21: vit D 12.78 08-23-21: vitamin B 12: 204; iron 22; tibc 185; ferritin 234;  08-24-21: wbc 9.2; hgb 9.1; hct 27.0; mcv 90.0 plt 154; glucose 104; bun 17; creat 0.76; k + 3.8; na++ 136; ca 8.1; GFR >60 09-30-21: tsh 4.221 10-18-21: urine culture: 40,000 colonies: citrobacter freundii  01-16-22; glucose 62; bun 19; creat 0.91; k+ 4.6; na++ 139; ca 8.7; gfr 60; vitamin D 29.35  04-20-22: wbc 5.9; hgb 12.4; hct 39.8; mcv 97.5 plt 204; glucose 86; bun 14; creat 0.83; k+ 4.0; na++ 140; ca 8.4; gfr >60; protein 6.3; albumin 3.4; tsh 3.839; iron 58; tibc 304; vitamin D 45.06      NO NEW LABS.    Review of Systems  Constitutional:  Negative for malaise/fatigue.  Respiratory:  Negative for cough and shortness of breath.   Cardiovascular:  Negative for chest pain, palpitations and leg swelling.  Gastrointestinal:  Negative for abdominal pain, constipation and heartburn.  Musculoskeletal:  Positive for back pain. Negative for joint pain and myalgias.  Skin: Negative.   Neurological:  Negative for dizziness.  Psychiatric/Behavioral:  The patient is not nervous/anxious.    Physical Exam Constitutional:      General: She is not in acute distress.    Appearance: She is well-developed. She is not diaphoretic.  Neck:     Thyroid: No thyromegaly.  Cardiovascular:     Rate and Rhythm: Normal rate and regular rhythm.     Pulses: Normal pulses.     Heart sounds: Murmur heard.  Pulmonary:     Effort: Pulmonary effort is normal. No respiratory distress.     Breath sounds: Normal breath sounds.  Abdominal:     General: Bowel sounds are normal. There is no distension.     Palpations: Abdomen is soft.     Tenderness: There is no abdominal tenderness.  Musculoskeletal:        General: Normal range of motion.     Cervical back: Neck supple.     Right lower leg: No edema.     Left lower leg: No edema.  Lymphadenopathy:     Cervical: No cervical adenopathy.  Skin:    General: Skin is warm and dry.  Neurological:     Mental Status: She is alert. Mental status is at baseline.  Psychiatric:        Mood and Affect: Mood normal.       ASSESSMENT/ PLAN:  TODAY  Vitamin D deficiency: level 45.06  2. Vitamin B 12 deficiency: level 204  3. Aortic atherosclerosis (ct 08-02-21) is on asa off statin due to advanced age   PREVIOUS   4. Post menopausal osteoporosis t score -4.184 will continue to monitor   5. Protein calorie malnutrition:  will continue boost twice daily   6. History of CVA (cerebral vascular accident) will continue asa 81 mg daily   7. Atrial  fibrillation/sick sinus syndrome; will continue amiodarone 200 mg daily; asa 81 mg daily   8 Primary hypertension: b/p 110/81: will monitor   9. Gastroesophageal reflux disease without esophagitis: will continue to monitor    10. Chronic kidney disease stage 3b: bun 14; creat 0.83; gfr>60  11. Anemia due to chronic kidney disease: hgb 12.4   12. Mixed hyperlipidemia: is off lipitor due to advanced age.       Ok Edwards NP [facility]  call [phone]

## 2022-07-18 ENCOUNTER — Non-Acute Institutional Stay (SKILLED_NURSING_FACILITY): Payer: Medicare Other | Admitting: Adult Health

## 2022-07-18 ENCOUNTER — Encounter: Payer: Self-pay | Admitting: Adult Health

## 2022-07-18 DIAGNOSIS — R600 Localized edema: Secondary | ICD-10-CM

## 2022-07-18 DIAGNOSIS — G8929 Other chronic pain: Secondary | ICD-10-CM

## 2022-07-18 DIAGNOSIS — M545 Low back pain, unspecified: Secondary | ICD-10-CM

## 2022-07-18 NOTE — Progress Notes (Unsigned)
Location:  [facility] Room Number: 121 D Place of Service:  SNF (31)   CODE STATUS: DNR   Allergies  Allergen Reactions   Septra Ds [Sulfamethoxazole-Trimethoprim]    Lisinopril Other (See Comments)    Cough    Chief Complaint  Patient presents with   Acute Visit    Edema    HPI:  Staff report that she continues to have bilateral pedal edema right greater than left. She is also having back pain. She states that the solan pas patch is effective; but would be better if it covered a larger area.   Past Medical History:  Diagnosis Date   Acute metabolic encephalopathy 0000000   Atrial fibrillation ([place])    Bleeding stomach ulcer 1980s?   CVA (cerebral vascular accident) ([place]) 07/2021   DIVERTICULOSIS, COLON 11/22/2006   Headache(784.0)    "very often; not regular" (11/25/2015)   Heart block    hx of   Hip fracture ([place]) 07/2021   History of blood transfusion 1980s   "when I had the bleeding ulcer"   HYPERLIPIDEMIA 11/22/2006   Hypertension    Melanoma ([place])    LLE   Menopausal syndrome (hot flashes)    OSTEOPOROSIS 11/22/2006   Presence of permanent cardiac pacemaker    Vitamin D deficiency 08/21/2021    Past Surgical History:  Procedure Laterality Date   APPENDECTOMY     BUNIONECTOMY Right    CATARACT EXTRACTION W/ INTRAOCULAR LENS  IMPLANT, BILATERAL Bilateral 2000s   CHOLECYSTECTOMY OPEN     EP IMPLANTABLE DEVICE N/A 11/25/2015   Procedure: Pacemaker Implant;  Surgeon: Will Meredith Leeds, MD;  Location: [facility];  Service: Cardiovascular;  Laterality: N/A;   FEMUR IM NAIL Left 07/08/2014   Procedure: INTRAMEDULLARY (IM) NAIL ;  Surgeon: Meredith Pel, MD;  [facility];  Service: Orthopedics;  Laterality: Left;   FRACTURE SURGERY     HIP ARTHROPLASTY Right 08/20/2021   Procedure: ARTHROPLASTY BIPOLAR HIP (HEMIARTHROPLASTY);  Surgeon: Altamese [place], MD;  Location: [facility];  Service: Orthopedics;  Laterality: Right;    IR KYPHO LUMBAR INC FX REDUCE BONE BX UNI/BIL CANNULATION INC/IMAGING  03/21/2021   PACEMAKER INSERTION     REVISION TOTAL HIP ARTHROPLASTY Left 06/2014    Social History   Socioeconomic History   Marital status: Widowed    Spouse name: Not on file   Number of children: 2   Years of education: 62   Highest education level: Not on file  Occupational History    Comment: retired  Tobacco Use   Smoking status: Never    Passive exposure: Never   Smokeless tobacco: Never  Vaping Use   Vaping Use: Never used  Substance and Sexual Activity   Alcohol use: No    Alcohol/week: 0.0 standard drinks of alcohol   Drug use: No   Sexual activity: Never  Other Topics Concern   Not on file  Social History Narrative   09/21/21 resides at [place] alone, son lives beside her   Caffeine - none   Social Determinants of Health   Financial Resource Strain: Low Risk  (05/13/2020)   Overall Financial Resource Strain (CARDIA)    Difficulty of Paying Living Expenses: Not hard at all  Food Insecurity: No Food Insecurity (05/13/2020)   Hunger Vital Sign    Worried About Running Out of Food in the Last Year: Never true    [place] in  the Last Year: Never true  Transportation Needs: No Transportation Needs (05/13/2020)   PRAPARE - Hydrologist (Medical): No    Lack of Transportation (Non-Medical): No  Physical Activity: Inactive (05/13/2020)   Exercise Vital Sign    Days of Exercise per Week: 0 days    Minutes of Exercise per Session: 0 min  Stress: No Stress Concern Present (05/13/2020)   [place]    Feeling of Stress : Not at all  Social Connections: Socially Isolated (05/13/2020)   Social Connection and Isolation Panel [NHANES]    Frequency of Communication with Friends and Family: More than three times a week    Frequency of Social Gatherings with Friends and Family: More than  three times a week    Attends Religious Services: Never    Marine scientist or Organizations: No    Attends Archivist Meetings: Never    Marital Status: Widowed  Intimate Partner Violence: Not At Risk (05/13/2020)   Humiliation, Afraid, Rape, and Kick questionnaire    Fear of Current or Ex-Partner: No    Emotionally Abused: No    Physically Abused: No    Sexually Abused: No   Family History  Problem Relation Age of Onset   Alzheimer's disease Sister    Cardiomyopathy Brother       VITAL SIGNS BP 127/63   Pulse 68   Temp 97.8 F (36.6 C)   Resp 16   Ht '5\' 5"'$  (1.651 m)   Wt 109 lb 2 oz (49.5 kg)   SpO2 94%   BMI 18.16 kg/m   Outpatient Encounter Medications as of 07/18/2022  Medication Sig   Acetaminophen (TYLENOL ARTHRITIS PAIN PO) Take 650 mg by mouth every 6 (six) hours.   amiodarone (PACERONE) 200 MG tablet Take 1 tablet (200 mg total) by mouth daily.   aspirin 81 MG chewable tablet Chew 1 tablet (81 mg total) by mouth daily.   Camphor-Menthol-Methyl Sal (SALONPAS) 3.05-27-08 % PTCH Apply 1 patch topically daily. At 9 am   feeding supplement (BOOST HIGH PROTEIN) LIQD Take 1 Container by mouth in the morning and at bedtime. 0.06 gram- 1 kcal/mL; amt: 231m   methocarbamol (ROBAXIN) 500 MG tablet Take 500 mg by mouth every 6 (six) hours as needed for muscle spasms.   nitrofurantoin, macrocrystal-monohydrate, (MACROBID) 100 MG capsule Take 100 mg by mouth at bedtime. UTI prophylaxis   NON FORMULARY Diet - Dysphagia 3   senna-docusate (SENNA S) 8.6-50 MG tablet Take 1 tablet by mouth 2 (two) times daily.   No facility-administered encounter medications on file as of 07/18/2022.     SIGNIFICANT DIAGNOSTIC EXAMS  PREVIOUS   08-17-21: ct head stroke:  1. No acute CT finding. Atrophy and chronic small-vessel ischemic changes. 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 2. ASPECTS is 10, allowing for the old infarction  08-17-21: ct angio of head:  1. Decreased  perfusion (Tmax> 6 seconds) in the posterior left MCA territory, which correlates with an area with decreased contrast enhancement in the MCA branches. While there is mild to moderate narrowing in the distal left M1 segment, no additional focal stenosis or occlusion is seen in the left MCA branches. No infarct core. 2. Multifocal narrowing in the bilateral PCAs. No other hemodynamically significant intracranial stenosis. 3. Approximately 50% proximal left ICA stenosis, unchanged. No additional hemodynamically significant stenosis in the neck.  08-17-21: right hip x-ray: Acute displaced right femoral neck  fracture.   08-18-21: MRI of brain 1. Moderately motion limited study with evidence of acute left frontal and parietal infarcts. The left parietal infarct is along the posterior aspect of a remote left parietal infarct. 2. Moderate chronic microvascular ischemic disease. 3.  Cerebral atrophy   01-04-22: dexa scan: t score -4.184   NO NEW EXAMS   LABS REVIEWED; PREVIOUS   08-17-21: wbc 19.6 hgb 14.3; hct 45.2 mcv 94.8 plt 209; glucose 144; bun 15; creat 1.22 k+ 3.8; na++ 137; ca 9.1; GFR 42; protein 6.7; albumin 3.8 hgb a1c 5.7 08-18-21: urine culture <10,000. Vitamin B 12: 232; tsh 1.864 08-21-21: vit D 12.78 08-23-21: vitamin B 12: 204; iron 22; tibc 185; ferritin 234;  08-24-21: wbc 9.2; hgb 9.1; hct 27.0; mcv 90.0 plt 154; glucose 104; bun 17; creat 0.76; k + 3.8; na++ 136; ca 8.1; GFR >60 09-30-21: tsh 4.221 10-18-21: urine culture: 40,000 colonies: citrobacter freundii  01-16-22; glucose 62; bun 19; creat 0.91; k+ 4.6; na++ 139; ca 8.7; gfr 60; vitamin D 29.35  04-20-22: wbc 5.9; hgb 12.4; hct 39.8; mcv 97.5 plt 204; glucose 86; bun 14; creat 0.83; k+ 4.0; na++ 140; ca 8.4; gfr >60; protein 6.3; albumin 3.4; tsh 3.839; iron 58; tibc 304; vitamin D 45.06     NO NEW LABS.   Review of Systems  Constitutional:  Negative for malaise/fatigue.  Respiratory:  Negative for cough and shortness of  breath.   Cardiovascular:  Negative for chest pain, palpitations and leg swelling.  Gastrointestinal:  Negative for abdominal pain, constipation and heartburn.  Musculoskeletal:  Positive for back pain. Negative for joint pain and myalgias.  Skin: Negative.   Neurological:  Negative for dizziness.  Psychiatric/Behavioral:  The patient is not nervous/anxious.     Physical Exam Constitutional:      General: She is not in acute distress.    Appearance: She is well-developed. She is not diaphoretic.  Neck:     Thyroid: No thyromegaly.  Cardiovascular:     Rate and Rhythm: Normal rate and regular rhythm.     Heart sounds: Normal heart sounds.  Pulmonary:     Effort: Pulmonary effort is normal. No respiratory distress.     Breath sounds: Normal breath sounds.  Abdominal:     General: Bowel sounds are normal. There is no distension.     Palpations: Abdomen is soft.     Tenderness: There is no abdominal tenderness.  Musculoskeletal:        General: Normal range of motion.     Cervical back: Neck supple.     Right lower leg: Edema present.     Left lower leg: Edema present.     Comments: 2-3+ bilateral pedal edema   Lymphadenopathy:     Cervical: No cervical adenopathy.  Skin:    General: Skin is warm and dry.  Neurological:     Mental Status: She is alert. Mental status is at baseline.  Psychiatric:        Mood and Affect: Mood normal.     ASSESSMENT/ PLAN:  TODAY  Bilateral lower extremity edema Chronic midline low back pain without sciatica   Will begin 2 salon pas patches to her lower back Will have her wear TED hose for her pedal edema   Ok Edwards NP [facility]  call [phone]

## 2022-07-19 DIAGNOSIS — R600 Localized edema: Secondary | ICD-10-CM | POA: Insufficient documentation

## 2022-07-24 DIAGNOSIS — M79675 Pain in left toe(s): Secondary | ICD-10-CM | POA: Diagnosis not present

## 2022-07-24 DIAGNOSIS — M2041 Other hammer toe(s) (acquired), right foot: Secondary | ICD-10-CM | POA: Diagnosis not present

## 2022-07-24 DIAGNOSIS — M79674 Pain in right toe(s): Secondary | ICD-10-CM | POA: Diagnosis not present

## 2022-07-24 DIAGNOSIS — I739 Peripheral vascular disease, unspecified: Secondary | ICD-10-CM | POA: Diagnosis not present

## 2022-07-24 DIAGNOSIS — B351 Tinea unguium: Secondary | ICD-10-CM | POA: Diagnosis not present

## 2022-07-24 DIAGNOSIS — R262 Difficulty in walking, not elsewhere classified: Secondary | ICD-10-CM | POA: Diagnosis not present

## 2022-07-24 DIAGNOSIS — M2042 Other hammer toe(s) (acquired), left foot: Secondary | ICD-10-CM | POA: Diagnosis not present

## 2022-08-01 ENCOUNTER — Non-Acute Institutional Stay (SKILLED_NURSING_FACILITY): Payer: Medicare Other | Admitting: Internal Medicine

## 2022-08-01 ENCOUNTER — Encounter: Payer: Self-pay | Admitting: Internal Medicine

## 2022-08-01 DIAGNOSIS — D631 Anemia in chronic kidney disease: Secondary | ICD-10-CM

## 2022-08-01 DIAGNOSIS — N1832 Chronic kidney disease, stage 3b: Secondary | ICD-10-CM | POA: Diagnosis not present

## 2022-08-01 DIAGNOSIS — E559 Vitamin D deficiency, unspecified: Secondary | ICD-10-CM

## 2022-08-01 DIAGNOSIS — I482 Chronic atrial fibrillation, unspecified: Secondary | ICD-10-CM | POA: Diagnosis not present

## 2022-08-01 DIAGNOSIS — E440 Moderate protein-calorie malnutrition: Secondary | ICD-10-CM

## 2022-08-01 NOTE — Progress Notes (Signed)
NURSING HOME LOCATION:  [facility] ROOM NUMBER:  [id number]  CODE STATUS:  DNR  PCP:  Ok Edwards NP  This is a nursing facility follow up visit of chronic medical diagnoses & to document compliance with Regulation 483.30 (c) in [employer] [place] Manual Phase 2 which mandates caregiver visit ( visits can alternate among physician, PA or NP as per statutes) within 10 days of 30 days / 60 days/ 90 days post admission to SNF date    Interim medical record and care since last SNF visit was updated with review of diagnostic studies and change in clinical status since last visit were documented.  She is a permanent resident of this is a nursing facility with history of atrial fibrillation, history of bleeding gastric ulcer, history of CVA, history of diverticulosis, history of heart block, dyslipidemia, essential hypertension, history of melanoma, vitamin D deficiency, and osteoporosis. Significant procedures include open cholecystectomy, interventional radiology lumbar surgery, and pacemaker insertion.  Most recent labs were completed 04/20/2022 and revealed mild hypocalcemia with a value of 8.4, down from prior value of 8.7.  Protein/caloric malnutrition was present with albumin of 3.4, up from a value of 2.7.Total protein was 6.3 up from prior values of 5.7.  Vitamin D level was therapeutic at 45.06.  CBC and differential are normal.  TSH was therapeutic at 3.839.  Review of systems: Dementia invalidated responses.  Staff reports that she was in the bathroom for extended period time as she did not understand how to use the call light to return to bed.  She states that she does not eat breakfast "I do not want it."  She described nausea and vomiting yesterday.  She also has intermittent constipation.  Her main concern is "I worry about my feet . red and swollen."  She also describes throbbing back pain which is nonradiating.  She states that the topical agents is of minimal  benefit.  There is no associated stool or urinary incontinence.  Constitutional: No fever, significant weight change, fatigue  Eyes: No redness, discharge, pain, vision change ENT/mouth: No nasal congestion,  purulent discharge, earache, change in hearing, sore throat  Cardiovascular: No chest pain, palpitations, paroxysmal nocturnal dyspnea, claudication, edema  Respiratory: No cough, sputum production, hemoptysis, DOE, significant snoring, apnea   Gastrointestinal: No heartburn, dysphagia, abdominal pain, rectal bleeding, melena Genitourinary: No dysuria, hematuria, pyuria, incontinence, nocturia Neurologic: No dizziness, headache, syncope, seizures, numbness, tingling Psychiatric: No significant anxiety, depression, insomnia, anorexia Endocrine: No change in hair/skin/nails, excessive thirst, excessive hunger, excessive urination  Hematologic/lymphatic: No significant bruising, lymphadenopathy, abnormal bleeding Allergy/immunology: No itchy/watery eyes, significant sneezing, urticaria, angioedema  Physical exam:  Pertinent or positive findings: She appears her stated age.  Facies tend to be blank.  Responses are slow and deliberate.  There is an oblique well-healed scar over the forehead.  The left nasolabial fold is decreased inferiorly.  She has minor rales which clear with deep inspiration.  Rhythm is regular.  Slight increase in second heart sound is present.  Pedal pulses are decreased.  She is wearing compression hose.  Strength to opposition is stronger in the lower extremities than in the upper extremities.  Limb atrophy is present as well as interosseous wasting. Negative SLR testing findings present. She exhibits a very fine tremor of the hand ,greater on the right left than the right.  General appearance:  no acute distress, increased work of breathing is present.   Lymphatic: No lymphadenopathy about the head, neck,  axilla. Eyes: No conjunctival inflammation or lid edema is  present. There is no scleral icterus. Ears:  External ear exam shows no significant lesions or deformities.   Nose:  External nasal examination shows no deformity or inflammation. Nasal mucosa are pink and moist without lesions, exudates Oral exam:  Lips and gums are healthy appearing. There is no oropharyngeal erythema or exudate. Neck:  No thyromegaly, masses, tenderness noted.    Heart:  No gallop, murmur, click, rub .  Lungs:  without wheezes, rhonchi,  rubs. Abdomen: Bowel sounds are normal. Abdomen is soft and nontender with no organomegaly, hernias, masses. GU: Deferred  Extremities:  No cyanosis, clubbing, edema  Neurologic exam :Balance, [place], finger to nose testing could not be completed due to clinical state Skin: Warm & dry w/o tenting. No significant lesions or rash.  See summary under each active problem in the Problem List with associated updated therapeutic plan

## 2022-08-01 NOTE — Patient Instructions (Signed)
See assessment and plan under each diagnosis in the problem list and acutely for this visit 

## 2022-08-02 NOTE — Assessment & Plan Note (Signed)
04/20/2022 vitamin D level therapeutic at 45.06.  No change indicated.

## 2022-08-02 NOTE — Assessment & Plan Note (Addendum)
04/20/2022 albumin 3.4, up from a value 2.7.  Total protein 6.3, up from 5.7.  Limb atrophy and interosseous wasting is present.  Nutritionist will continue to follow at SNF.

## 2022-08-02 NOTE — Assessment & Plan Note (Addendum)
04/20/2022 CBC and differential is normal.  Anemia resolved.  Continue to monitor.  GFR is greater than 60 indicating CKD stage II.

## 2022-08-02 NOTE — Assessment & Plan Note (Addendum)
Last EKG on record  02/07/2022 revealed normal sinus rhythm.  Clinically rhythm is regular.There is PMH of pacemaker placement., but last EKG did not reveal pacer spikes.

## 2022-08-04 ENCOUNTER — Encounter: Payer: Self-pay | Admitting: Adult Health

## 2022-08-04 ENCOUNTER — Non-Acute Institutional Stay (SKILLED_NURSING_FACILITY): Payer: Medicare Other | Admitting: Adult Health

## 2022-08-04 DIAGNOSIS — D631 Anemia in chronic kidney disease: Secondary | ICD-10-CM | POA: Diagnosis not present

## 2022-08-04 DIAGNOSIS — N1832 Chronic kidney disease, stage 3b: Secondary | ICD-10-CM | POA: Diagnosis not present

## 2022-08-04 DIAGNOSIS — R627 Adult failure to thrive: Secondary | ICD-10-CM | POA: Diagnosis not present

## 2022-08-04 DIAGNOSIS — I482 Chronic atrial fibrillation, unspecified: Secondary | ICD-10-CM

## 2022-08-04 NOTE — Progress Notes (Signed)
Location:  [facility] Room Number: 121 Place of Service:   SNF    CODE STATUS: dnr   Allergies  Allergen Reactions   Septra Ds [Sulfamethoxazole-Trimethoprim]    Lisinopril Other (See Comments)    Cough    Chief Complaint  Patient presents with   Acute Visit    Care plan meeting     HPI:  We have come together for her care plan meeting. BIMS 9/15 mood 2/30: not eating well. She is wheelchair bound without falls. She requires moderate to dependent for her adl care. She is incontinent of bladder and bowel. Dietary; appetite is fair; weight is 109.6 pounds with regular diet; boost with meals; feeds self. Therapy: none at this time. Activities: is active.  She will continue to be followed for her chronic illnesses including: Atrial fibrillation chronic  Anemia due to stage 3b chronic kidney disease   Stage 3b chronic kidney disease  Failure to thrive in adult.  Past Medical History:  Diagnosis Date   Acute metabolic encephalopathy 0000000   Atrial fibrillation ([place])    Bleeding stomach ulcer 1980s?   CVA (cerebral vascular accident) ([place]) 07/2021   DIVERTICULOSIS, COLON 11/22/2006   Headache(784.0)    "very often; not regular" (11/25/2015)   Heart block    hx of   Hip fracture ([place]) 07/2021   History of blood transfusion 1980s   "when I had the bleeding ulcer"   HYPERLIPIDEMIA 11/22/2006   Hypertension    Melanoma ([place])    LLE   Menopausal syndrome (hot flashes)    OSTEOPOROSIS 11/22/2006   Presence of permanent cardiac pacemaker    Vitamin D deficiency 08/21/2021    Past Surgical History:  Procedure Laterality Date   APPENDECTOMY     BUNIONECTOMY Right    CATARACT EXTRACTION W/ INTRAOCULAR LENS  IMPLANT, BILATERAL Bilateral 2000s   CHOLECYSTECTOMY OPEN     EP IMPLANTABLE DEVICE N/A 11/25/2015   Procedure: Pacemaker Implant;  Surgeon: Will Meredith Leeds, MD;  Location: [facility];  Service: Cardiovascular;  Laterality: N/A;   FEMUR  IM NAIL Left 07/08/2014   Procedure: INTRAMEDULLARY (IM) NAIL ;  Surgeon: Meredith Pel, MD;  Location: [facility];  Service: Orthopedics;  Laterality: Left;   FRACTURE SURGERY     HIP ARTHROPLASTY Right 08/20/2021   Procedure: ARTHROPLASTY BIPOLAR HIP (HEMIARTHROPLASTY);  Surgeon: Altamese Annandale, MD;  Location: [facility];  Service: Orthopedics;  Laterality: Right;   IR KYPHO LUMBAR INC FX REDUCE BONE BX UNI/BIL CANNULATION INC/IMAGING  03/21/2021   PACEMAKER INSERTION     REVISION TOTAL HIP ARTHROPLASTY Left 06/2014    Social History   Socioeconomic History   Marital status: Widowed    Spouse name: Not on file   Number of children: 2   Years of education: 27   Highest education level: Not on file  Occupational History    Comment: retired  Tobacco Use   Smoking status: Never    Passive exposure: Never   Smokeless tobacco: Never  Vaping Use   Vaping Use: Never used  Substance and Sexual Activity   Alcohol use: No    Alcohol/week: 0.0 standard drinks of alcohol   Drug use: No   Sexual activity: Never  Other Topics Concern   Not on file  Social History Narrative   09/21/21 resides at [facility] alone, son lives beside her   Caffeine - none   Social Determinants of Health  Financial Resource Strain: Low Risk  (05/13/2020)   Overall Financial Resource Strain (CARDIA)    Difficulty of Paying Living Expenses: Not hard at all  Food Insecurity: No Food Insecurity (05/13/2020)   Hunger Vital Sign    Worried About Running Out of Food in the Last Year: Never true    Ran Out of Food in the Last Year: Never true  Transportation Needs: No Transportation Needs (05/13/2020)   PRAPARE - Hydrologist (Medical): No    Lack of Transportation (Non-Medical): No  Physical Activity: Inactive (05/13/2020)   Exercise Vital Sign    Days of Exercise per Week: 0 days    Minutes of Exercise per Session: 0 min  Stress: No Stress Concern Present (05/13/2020)    [place]    Feeling of Stress : Not at all  Social Connections: Socially Isolated (05/13/2020)   Social Connection and Isolation Panel [NHANES]    Frequency of Communication with Friends and Family: More than three times a week    Frequency of Social Gatherings with Friends and Family: More than three times a week    Attends Religious Services: Never    Marine scientist or Organizations: No    Attends Archivist Meetings: Never    Marital Status: Widowed  Intimate Partner Violence: Not At Risk (05/13/2020)   Humiliation, Afraid, Rape, and Kick questionnaire    Fear of Current or Ex-Partner: No    Emotionally Abused: No    Physically Abused: No    Sexually Abused: No   Family History  Problem Relation Age of Onset   Alzheimer's disease Sister    Cardiomyopathy Brother       VITAL SIGNS BP 121/71   Pulse 95   Temp 97.6 F (36.4 C)   Resp 16   Ht 5\' 5"  (1.651 m)   Wt 109 lb 9.6 oz (49.7 kg)   BMI 18.24 kg/m   Outpatient Encounter Medications as of 08/04/2022  Medication Sig   Acetaminophen (TYLENOL ARTHRITIS PAIN PO) Take 650 mg by mouth every 6 (six) hours.   amiodarone (PACERONE) 200 MG tablet Take 1 tablet (200 mg total) by mouth daily.   aspirin 81 MG chewable tablet Chew 1 tablet (81 mg total) by mouth daily.   Camphor-Menthol-Methyl Sal (SALONPAS) 3.05-27-08 % PTCH Apply 1 patch topically daily. At 9 am   feeding supplement (BOOST HIGH PROTEIN) LIQD Take 1 Container by mouth in the morning and at bedtime. 0.06 gram- 1 kcal/mL; amt: 240ml   methocarbamol (ROBAXIN) 500 MG tablet Take 500 mg by mouth every 6 (six) hours as needed for muscle spasms.   nitrofurantoin, macrocrystal-monohydrate, (MACROBID) 100 MG capsule Take 100 mg by mouth at bedtime. UTI prophylaxis   NON FORMULARY Diet - Dysphagia 3   senna-docusate (SENNA S) 8.6-50 MG tablet Take 1 tablet by mouth 2 (two) times daily.    No facility-administered encounter medications on file as of 08/04/2022.     SIGNIFICANT DIAGNOSTIC EXAMS   PREVIOUS   08-17-21: ct head stroke:  1. No acute CT finding. Atrophy and chronic small-vessel ischemic changes. 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 2. ASPECTS is 10, allowing for the old infarction  08-17-21: ct angio of head:  1. Decreased perfusion (Tmax> 6 seconds) in the posterior left MCA territory, which correlates with an area with decreased contrast enhancement in the MCA branches. While there is mild to moderate narrowing in the distal  left M1 segment, no additional focal stenosis or occlusion is seen in the left MCA branches. No infarct core. 2. Multifocal narrowing in the bilateral PCAs. No other hemodynamically significant intracranial stenosis. 3. Approximately 50% proximal left ICA stenosis, unchanged. No additional hemodynamically significant stenosis in the neck.  08-17-21: right hip x-ray: Acute displaced right femoral neck fracture.   08-18-21: MRI of brain 1. Moderately motion limited study with evidence of acute left frontal and parietal infarcts. The left parietal infarct is along the posterior aspect of a remote left parietal infarct. 2. Moderate chronic microvascular ischemic disease. 3.  Cerebral atrophy   01-04-22: dexa scan: t score -4.184   NO NEW EXAMS   LABS REVIEWED; PREVIOUS   08-17-21: wbc 19.6 hgb 14.3; hct 45.2 mcv 94.8 plt 209; glucose 144; bun 15; creat 1.22 k+ 3.8; na++ 137; ca 9.1; GFR 42; protein 6.7; albumin 3.8 hgb a1c 5.7 08-18-21: urine culture <10,000. Vitamin B 12: 232; tsh 1.864 08-21-21: vit D 12.78 08-23-21: vitamin B 12: 204; iron 22; tibc 185; ferritin 234;  08-24-21: wbc 9.2; hgb 9.1; hct 27.0; mcv 90.0 plt 154; glucose 104; bun 17; creat 0.76; k + 3.8; na++ 136; ca 8.1; GFR >60 09-30-21: tsh 4.221 10-18-21: urine culture: 40,000 colonies: citrobacter freundii  01-16-22; glucose 62; bun 19; creat 0.91; k+ 4.6; na++ 139; ca 8.7;  gfr 60; vitamin D 29.35  04-20-22: wbc 5.9; hgb 12.4; hct 39.8; mcv 97.5 plt 204; glucose 86; bun 14; creat 0.83; k+ 4.0; na++ 140; ca 8.4; gfr >60; protein 6.3; albumin 3.4; tsh 3.839; iron 58; tibc 304; vitamin D 45.06     NO NEW LABS.   Review of Systems  Constitutional:  Negative for malaise/fatigue.  Respiratory:  Negative for cough and shortness of breath.   Cardiovascular:  Negative for chest pain, palpitations and leg swelling.  Gastrointestinal:  Negative for abdominal pain, constipation and heartburn.  Musculoskeletal:  Negative for back pain, joint pain and myalgias.  Skin: Negative.   Neurological:  Negative for dizziness.  Psychiatric/Behavioral:  The patient is not nervous/anxious.    Physical Exam Constitutional:      General: She is not in acute distress.    Appearance: She is well-developed. She is not diaphoretic.  Neck:     Thyroid: No thyromegaly.  Cardiovascular:     Rate and Rhythm: Normal rate and regular rhythm.     Pulses: Normal pulses.     Heart sounds: Normal heart sounds.  Pulmonary:     Effort: Pulmonary effort is normal. No respiratory distress.     Breath sounds: Normal breath sounds.  Abdominal:     General: Bowel sounds are normal. There is no distension.     Palpations: Abdomen is soft.     Tenderness: There is no abdominal tenderness.  Musculoskeletal:        General: Normal range of motion.     Cervical back: Neck supple.     Right lower leg: Edema present.     Left lower leg: Edema present.  Lymphadenopathy:     Cervical: No cervical adenopathy.  Skin:    General: Skin is warm and dry.  Neurological:     Mental Status: She is alert. Mental status is at baseline.  Psychiatric:        Mood and Affect: Mood normal.      ASSESSMENT/ PLAN:  TODAY  Atrial fibrillation chronic Anemia due to stage 3b chronic kidney disease Stage 3b chronic kidney disease Failure to thrive  in adult.  Will continue current medications Will  continue current plan of care Will continue to monitor her status.    Ok Edwards NP [facility] : [facility]  call [phone]

## 2022-08-28 ENCOUNTER — Encounter: Payer: Self-pay | Admitting: Adult Health

## 2022-08-28 ENCOUNTER — Non-Acute Institutional Stay (SKILLED_NURSING_FACILITY): Payer: Medicare Other | Admitting: Adult Health

## 2022-08-28 DIAGNOSIS — E440 Moderate protein-calorie malnutrition: Secondary | ICD-10-CM

## 2022-08-28 DIAGNOSIS — M810 Age-related osteoporosis without current pathological fracture: Secondary | ICD-10-CM

## 2022-08-28 DIAGNOSIS — Z8673 Personal history of transient ischemic attack (TIA), and cerebral infarction without residual deficits: Secondary | ICD-10-CM

## 2022-08-28 NOTE — Progress Notes (Unsigned)
Location:  [facility] Room Number: 55121 D Place of Service:  SNF (31)   CODE STATUS: dnr  Allergies  Allergen Reactions   Septra Ds [Sulfamethoxazole-Trimethoprim]    Lisinopril Other (See Comments)    Cough    Chief Complaint  Patient presents with   Medical Management of Chronic Issues                Post menopausal osteoporosis  Protein calorie malnutrition:  History of CVA (cerebrovascular accident)      HPI:  She is a 89 year old long term resident of this facility being seen for the management of her chronic illnesses: Post menopausal osteoporosis  Protein calorie malnutrition:  History of CVA (cerebrovascular accident). There are no reports of uncontrolled pain. She does get out of bed daily. Her weight is without significant change.   Past Medical History:  Diagnosis Date   Acute metabolic encephalopathy 07/2021   Atrial fibrillation    Bleeding stomach ulcer 1980s?   CVA (cerebral vascular accident) 07/2021   DIVERTICULOSIS, COLON 11/22/2006   Headache(784.0)    "very often; not regular" (11/25/2015)   Heart block    hx of   Hip fracture 07/2021   History of blood transfusion 1980s   "when I had the bleeding ulcer"   HYPERLIPIDEMIA 11/22/2006   Hypertension    Melanoma    LLE   Menopausal syndrome (hot flashes)    OSTEOPOROSIS 11/22/2006   Presence of permanent cardiac pacemaker    Vitamin D deficiency 08/21/2021    Past Surgical History:  Procedure Laterality Date   APPENDECTOMY     BUNIONECTOMY Right    CATARACT EXTRACTION W/ INTRAOCULAR LENS  IMPLANT, BILATERAL Bilateral 2000s   CHOLECYSTECTOMY OPEN     EP IMPLANTABLE DEVICE N/A 11/25/2015   Procedure: Pacemaker Implant;  Surgeon: Will Jorja LoaMartin Camnitz, MD;  Location: [facility];  Service: Cardiovascular;  Laterality: N/A;   FEMUR IM NAIL Left 07/08/2014   Procedure: INTRAMEDULLARY (IM) NAIL ;  Surgeon: Cammy CopaGregory Scott Dean, MD;  Location: [facility];  Service: Orthopedics;   Laterality: Left;   FRACTURE SURGERY     HIP ARTHROPLASTY Right 08/20/2021   Procedure: ARTHROPLASTY BIPOLAR HIP (HEMIARTHROPLASTY);  Surgeon: Myrene GalasHandy, Michael, MD;  [facility];  Service: Orthopedics;  Laterality: Right;   IR KYPHO LUMBAR INC FX REDUCE BONE BX UNI/BIL CANNULATION INC/IMAGING  03/21/2021   PACEMAKER INSERTION     REVISION TOTAL HIP ARTHROPLASTY Left 06/2014    Social History   Socioeconomic History   Marital status: Widowed    Spouse name: Not on file   Number of children: 2   Years of education: 2712   Highest education level: Not on file  Occupational History    Comment: retired  Tobacco Use   Smoking status: Never    Passive exposure: Never   Smokeless tobacco: Never  Vaping Use   Vaping Use: Never used  Substance and Sexual Activity   Alcohol use: No    Alcohol/week: 0.0 standard drinks of alcohol   Drug use: No   Sexual activity: Never  Other Topics Concern   Not on file  Social History Narrative   09/21/21 [facility]      Lives alone, son lives beside her   Caffeine - none   Social Determinants of Health   Financial Resource Strain: Low Risk  (05/13/2020)   Overall Financial Resource Strain (CARDIA)    Difficulty of Paying Living Expenses:  Not hard at all  Food Insecurity: No Food Insecurity (05/13/2020)   Hunger Vital Sign    Worried About Running Out of Food in the Last Year: Never true    Ran Out of Food in the Last Year: Never true  Transportation Needs: No Transportation Needs (05/13/2020)   PRAPARE - Administrator, Civil Service (Medical): No    Lack of Transportation (Non-Medical): No  Physical Activity: Inactive (05/13/2020)   Exercise Vital Sign    Days of Exercise per Week: 0 days    Minutes of Exercise per Session: 0 min  Stress: No Stress Concern Present (05/13/2020)   [employer] of Occupational Health - Occupational Stress Questionnaire    Feeling of Stress : Not at all  Social Connections:  Socially Isolated (05/13/2020)   Social Connection and Isolation Panel [NHANES]    Frequency of Communication with Friends and Family: More than three times a week    Frequency of Social Gatherings with Friends and Family: More than three times a week    Attends Religious Services: Never    Database administrator or Organizations: No    Attends Banker Meetings: Never    Marital Status: Widowed  Intimate Partner Violence: Not At Risk (05/13/2020)   Humiliation, Afraid, Rape, and Kick questionnaire    Fear of Current or Ex-Partner: No    Emotionally Abused: No    Physically Abused: No    Sexually Abused: No   Family History  Problem Relation Age of Onset   Alzheimer's disease Sister    Cardiomyopathy Brother       VITAL SIGNS BP (!) 142/72   Pulse 60   Temp 98.4 F (36.9 C)   Ht 5\' 5"  (1.651 m)   Wt 114 lb (51.7 kg)   SpO2 99%   BMI 18.97 kg/m   Outpatient Encounter Medications as of 08/28/2022  Medication Sig   Acetaminophen (TYLENOL ARTHRITIS PAIN PO) Take 650 mg by mouth 3 (three) times daily.   amiodarone (PACERONE) 200 MG tablet Take 1 tablet (200 mg total) by mouth daily.   aspirin 81 MG chewable tablet Chew 1 tablet (81 mg total) by mouth daily.   Camphor-Menthol-Methyl Sal (SALONPAS) 3.05-27-08 % PTCH Apply 1 patch topically daily. At 9 am   feeding supplement (BOOST HIGH PROTEIN) LIQD Take 1 Container by mouth in the morning and at bedtime. 0.06 gram- 1 kcal/mL; amt:   methocarbamol (ROBAXIN) 500 MG tablet Take 500 mg by mouth every 6 (six) hours as needed for muscle spasms.   NON FORMULARY Diet - Dysphagia 3   ondansetron (ZOFRAN-ODT) 4 MG disintegrating tablet Take 4 mg by mouth every 8 (eight) hours as needed for nausea or vomiting.   senna-docusate (SENNA S) 8.6-50 MG tablet Take 1 tablet by mouth 2 (two) times daily.   [DISCONTINUED] nitrofurantoin, macrocrystal-monohydrate, (MACROBID) 100 MG capsule Take 100 mg by mouth at bedtime. UTI  prophylaxis   No facility-administered encounter medications on file as of 08/28/2022.     SIGNIFICANT DIAGNOSTIC EXAMS  PREVIOUS  01-04-22: dexa scan: t score -4.184   NO NEW EXAMS   LABS REVIEWED; PREVIOUS   08-21-21: vit D 12.78 08-23-21: vitamin B 12: 204; iron 22; tibc 185; ferritin 234;  08-24-21: wbc 9.2; hgb 9.1; hct 27.0; mcv 90.0 plt 154; glucose 104; bun 17; creat 0.76; k + 3.8; na++ 136; ca 8.1; GFR >60 09-30-21: tsh 4.221 10-18-21: urine culture: 40,000 colonies: citrobacter freundii  01-16-22; glucose  62; bun 19; creat 0.91; k+ 4.6; na++ 139; ca 8.7; gfr 60; vitamin D 29.35  04-20-22: wbc 5.9; hgb 12.4; hct 39.8; mcv 97.5 plt 204; glucose 86; bun 14; creat 0.83; k+ 4.0; na++ 140; ca 8.4; gfr >60; protein 6.3; albumin 3.4; tsh 3.839; iron 58; tibc 304; vitamin D 45.06     NO NEW LABS.   Review of Systems  Constitutional:  Negative for malaise/fatigue.  Respiratory:  Negative for cough and shortness of breath.   Cardiovascular:  Negative for chest pain, palpitations and leg swelling.  Gastrointestinal:  Negative for abdominal pain, constipation and heartburn.  Musculoskeletal:  Negative for back pain, joint pain and myalgias.  Skin: Negative.   Neurological:  Negative for dizziness.  Psychiatric/Behavioral:  The patient is not nervous/anxious.    Physical Exam Constitutional:      General: She is not in acute distress.    Appearance: She is well-developed. She is not diaphoretic.  Neck:     Thyroid: No thyromegaly.  Cardiovascular:     Rate and Rhythm: Normal rate and regular rhythm.     Pulses: Normal pulses.     Heart sounds: Murmur heard.  Pulmonary:     Effort: Pulmonary effort is normal. No respiratory distress.     Breath sounds: Normal breath sounds.  Abdominal:     General: Bowel sounds are normal. There is no distension.     Palpations: Abdomen is soft.     Tenderness: There is no abdominal tenderness.  Musculoskeletal:        General: Normal range of  motion.     Cervical back: Neck supple.     Right lower leg: Edema present.     Left lower leg: Edema present.  Lymphadenopathy:     Cervical: No cervical adenopathy.  Skin:    General: Skin is warm and dry.  Neurological:     Mental Status: She is alert. Mental status is at baseline.  Psychiatric:        Mood and Affect: Mood normal.       ASSESSMENT/ PLAN:  TODAY  Post menopausal osteoporosis t score -4.184 will monitor  2. Protein calorie malnutrition: will continue supplements  3. History of CVA (cerebrovascular accident) is on asa 81 mg daily   PREVIOUS   4. Atrial fibrillation/sick sinus syndrome; will continue amiodarone 200 mg daily; asa 81 mg daily   5 Primary hypertension: b/p 142/72: will monitor   6. Gastroesophageal reflux disease without esophagitis: will continue to monitor    7. Chronic kidney disease stage 3b: bun 14; creat 0.83; gfr>60  8. Anemia due to chronic kidney disease: hgb 12.4   9. Mixed hyperlipidemia: is off lipitor due to advanced age.   10. Vitamin D deficiency: level 45.06  11. Vitamin B 12 deficiency: level 204  12. Aortic atherosclerosis (ct 08-02-21) is on asa off statin due to advanced age       Synthia Innocent NP [facility]  call [phone]

## 2022-09-26 DIAGNOSIS — L603 Nail dystrophy: Secondary | ICD-10-CM | POA: Diagnosis not present

## 2022-09-26 DIAGNOSIS — I739 Peripheral vascular disease, unspecified: Secondary | ICD-10-CM | POA: Diagnosis not present

## 2022-10-02 ENCOUNTER — Non-Acute Institutional Stay (SKILLED_NURSING_FACILITY): Payer: Medicare Other | Admitting: Adult Health

## 2022-10-02 ENCOUNTER — Encounter: Payer: Self-pay | Admitting: Adult Health

## 2022-10-02 DIAGNOSIS — I10 Essential (primary) hypertension: Secondary | ICD-10-CM

## 2022-10-02 DIAGNOSIS — I495 Sick sinus syndrome: Secondary | ICD-10-CM | POA: Diagnosis not present

## 2022-10-02 DIAGNOSIS — K219 Gastro-esophageal reflux disease without esophagitis: Secondary | ICD-10-CM

## 2022-10-02 DIAGNOSIS — I482 Chronic atrial fibrillation, unspecified: Secondary | ICD-10-CM | POA: Diagnosis not present

## 2022-10-02 NOTE — Progress Notes (Signed)
Location:  [facility] Room Number: 121 Place of Service:  SNF (31)   CODE STATUS: dnr  Allergies  Allergen Reactions   Septra Ds [Sulfamethoxazole-Trimethoprim]    Lisinopril Other (See Comments)    Cough    Chief Complaint  Patient presents with   Medical Management of Chronic Issues                    Atrial fibrillation/sick sinus syndrome:  Primary hypertension:  Gastroesophageal reflux disease without esophagitis     HPI:  She is a 89 year old long term resident of this facility being seen for the management of her chronic illnesses:   Atrial fibrillation/sick sinus syndrome:  Primary hypertension:  Gastroesophageal reflux disease without esophagitis. There are no reports of uncontrolled pain. Weight is stable; no reports of heart burn.    Past Medical History:  Diagnosis Date   Acute metabolic encephalopathy 07/2021   Atrial fibrillation (HCC)    Bleeding stomach ulcer 1980s?   CVA (cerebral vascular accident) (HCC) 07/2021   DIVERTICULOSIS, COLON 11/22/2006   Headache(784.0)    "very often; not regular" (11/25/2015)   Heart block    hx of   Hip fracture (HCC) 07/2021   History of blood transfusion 1980s   "when I had the bleeding ulcer"   HYPERLIPIDEMIA 11/22/2006   Hypertension    Melanoma (HCC)    LLE   Menopausal syndrome (hot flashes)    OSTEOPOROSIS 11/22/2006   Presence of permanent cardiac pacemaker    Vitamin D deficiency 08/21/2021    Past Surgical History:  Procedure Laterality Date   APPENDECTOMY     BUNIONECTOMY Right    CATARACT EXTRACTION W/ INTRAOCULAR LENS  IMPLANT, BILATERAL Bilateral 2000s   CHOLECYSTECTOMY OPEN     EP IMPLANTABLE DEVICE N/A 11/25/2015   Procedure: Pacemaker Implant;  Surgeon: Will Jorja Loa, MD;  [facility]: [facility] INV[facility];  Service: Cardiovascular;  Laterality: N/A;   FEMUR IM NAIL Left 07/08/2014   Procedure: INTRAMEDULLARY (IM) NAIL ;  Surgeon: Cammy Copa, MD;  Location: [facility]  ORS;  Service: Orthopedics;  Laterality: Left;   FRACTURE SURGERY     HIP ARTHROPLASTY Right 08/20/2021   Procedure: ARTHROPLASTY BIPOLAR HIP (HEMIARTHROPLASTY);  Surgeon: Myrene Galas, MD;  Location: [facility] OR;  Service: Orthopedics;  Laterality: Right;   IR KYPHO LUMBAR INC FX REDUCE BONE BX UNI/BIL CANNULATION INC/IMAGING  03/21/2021   PACEMAKER INSERTION     REVISION TOTAL HIP ARTHROPLASTY Left 06/2014    Social History   Socioeconomic History   Marital status: Widowed    Spouse name: Not on file   Number of children: 2   Years of education: 50   Highest education level: Not on file  Occupational History    Comment: retired  Tobacco Use   Smoking status: Never    Passive exposure: Never   Smokeless tobacco: Never  Vaping Use   Vaping Use: Never used  Substance and Sexual Activity   Alcohol use: No    Alcohol/week: 0.0 standard drinks of alcohol   Drug use: No   Sexual activity: Never  Other Topics Concern   Not on file  Social History Narrative   09/21/21 resides at [facility]      Lives alone, son lives beside her   Caffeine - none   Social Determinants of Health   Financial Resource Strain: Low Risk  (05/13/2020)   Overall Financial Resource Strain (CARDIA)  Difficulty of Paying Living Expenses: Not hard at all  Food Insecurity: No Food Insecurity (05/13/2020)   Hunger Vital Sign    Worried About Running Out of Food in the Last Year: Never true    Ran Out of Food in the Last Year: Never true  Transportation Needs: No Transportation Needs (05/13/2020)   PRAPARE - Administrator, Civil Service (Medical): No    Lack of Transportation (Non-Medical): No  Physical Activity: Inactive (05/13/2020)   Exercise Vital Sign    Days of Exercise per Week: 0 days    Minutes of Exercise per Session: 0 min  Stress: No Stress Concern Present (05/13/2020)   [employer] of Occupational Health - Occupational Stress Questionnaire    Feeling of Stress : Not at all   Social Connections: Socially Isolated (05/13/2020)   Social Connection and Isolation Panel [NHANES]    Frequency of Communication with Friends and Family: More than three times a week    Frequency of Social Gatherings with Friends and Family: More than three times a week    Attends Religious Services: Never    Database administrator or Organizations: No    Attends Banker Meetings: Never    Marital Status: Widowed  Intimate Partner Violence: Not At Risk (05/13/2020)   Humiliation, Afraid, Rape, and Kick questionnaire    Fear of Current or Ex-Partner: No    Emotionally Abused: No    Physically Abused: No    Sexually Abused: No   Family History  Problem Relation Age of Onset   Alzheimer's disease Sister    Cardiomyopathy Brother       VITAL SIGNS BP (!) 140/78   Pulse 78   Temp 97.7 F (36.5 C)   Resp 17   Ht 5\' 5"  (1.651 m)   Wt 114 lb 9.6 oz (52 kg)   SpO2 98%   BMI 19.07 kg/m   Outpatient Encounter Medications as of 10/02/2022  Medication Sig   Acetaminophen (TYLENOL ARTHRITIS PAIN PO) Take 650 mg by mouth 3 (three) times daily.   amiodarone (PACERONE) 200 MG tablet Take 1 tablet (200 mg total) by mouth daily.   aspirin 81 MG chewable tablet Chew 1 tablet (81 mg total) by mouth daily.   Camphor-Menthol-Methyl Sal (SALONPAS) 3.05-27-08 % PTCH Apply 1 patch topically daily. At 9 am   feeding supplement (BOOST HIGH PROTEIN) LIQD Take 1 Container by mouth in the morning and at bedtime. 0.06 gram- 1 kcal/mL; amt:   methocarbamol (ROBAXIN) 500 MG tablet Take 500 mg by mouth every 6 (six) hours as needed for muscle spasms.   NON FORMULARY Diet - Dysphagia 3   ondansetron (ZOFRAN-ODT) 4 MG disintegrating tablet Take 4 mg by mouth every 8 (eight) hours as needed for nausea or vomiting.   senna-docusate (SENNA S) 8.6-50 MG tablet Take 1 tablet by mouth 2 (two) times daily.   No facility-administered encounter medications on file as of 10/02/2022.      SIGNIFICANT DIAGNOSTIC EXAMS  PREVIOUS  01-04-22: dexa scan: t score -4.184   NO NEW EXAMS   LABS REVIEWED; PREVIOUS   09-30-21: tsh 4.221 10-18-21: urine culture: 40,000 colonies: citrobacter freundii  01-16-22; glucose 62; bun 19; creat 0.91; k+ 4.6; na++ 139; ca 8.7; gfr 60; vitamin D 29.35  04-20-22: wbc 5.9; hgb 12.4; hct 39.8; mcv 97.5 plt 204; glucose 86; bun 14; creat 0.83; k+ 4.0; na++ 140; ca 8.4; gfr >60; protein 6.3; albumin 3.4; tsh 3.839; iron  58; tibc 304; vitamin D 45.06     NO NEW LABS.   Review of Systems  Constitutional:  Negative for malaise/fatigue.  Respiratory:  Negative for cough and shortness of breath.   Cardiovascular:  Negative for chest pain, palpitations and leg swelling.  Gastrointestinal:  Negative for abdominal pain, constipation and heartburn.  Musculoskeletal:  Negative for back pain, joint pain and myalgias.  Skin: Negative.   Neurological:  Negative for dizziness.  Psychiatric/Behavioral:  The patient is not nervous/anxious.    Physical Exam Constitutional:      General: She is not in acute distress.    Appearance: She is underweight. She is not diaphoretic.  Neck:     Thyroid: No thyromegaly.  Cardiovascular:     Rate and Rhythm: Normal rate and regular rhythm.     Pulses: Normal pulses.     Heart sounds: Murmur heard.  Pulmonary:     Effort: Pulmonary effort is normal. No respiratory distress.     Breath sounds: Normal breath sounds.  Abdominal:     General: Bowel sounds are normal. There is no distension.     Palpations: Abdomen is soft.     Tenderness: There is no abdominal tenderness.  Musculoskeletal:        General: Normal range of motion.     Cervical back: Neck supple.     Right lower leg: Edema present.     Left lower leg: Edema present.  Lymphadenopathy:     Cervical: No cervical adenopathy.  Skin:    General: Skin is warm and dry.  Neurological:     Mental Status: She is alert. Mental status is at baseline.   Psychiatric:        Mood and Affect: Mood normal.         ASSESSMENT/ PLAN:  TODAY  Atrial fibrillation/sick sinus syndrome: s/p pacemaker will continue amiodarone 200 mg daily; asa 81 mg daily   2. Primary hypertension: b/p 140/78  3. Gastroesophageal reflux disease without esophagitis; will monitor   PREVIOUS   4. Chronic kidney disease stage 3b: bun 14; creat 0.83; gfr>60  5. Anemia due to chronic kidney disease: hgb 12.4   6. Mixed hyperlipidemia: is off lipitor due to advanced age.   7. Vitamin D deficiency: level 45.06  8. Vitamin B 12 deficiency: level 204  9. Aortic atherosclerosis (ct 08-02-21) is on asa off statin due to advanced age   24. Post menopausal osteoporosis t score -4.184 will monitor  11. Protein calorie malnutrition: will continue supplements  12. History of CVA (cerebrovascular accident) is on asa 81 mg daily      [name] Innocent NP [facility]  call [phone]

## 2022-10-05 ENCOUNTER — Non-Acute Institutional Stay (SKILLED_NURSING_FACILITY): Payer: Medicare Other | Admitting: Internal Medicine

## 2022-10-05 ENCOUNTER — Encounter: Payer: Self-pay | Admitting: Internal Medicine

## 2022-10-05 DIAGNOSIS — I482 Chronic atrial fibrillation, unspecified: Secondary | ICD-10-CM

## 2022-10-05 DIAGNOSIS — H1032 Unspecified acute conjunctivitis, left eye: Secondary | ICD-10-CM | POA: Diagnosis not present

## 2022-10-05 NOTE — Patient Instructions (Signed)
See assessment and plan under each diagnosis in the problem list and acutely for this visit 

## 2022-10-05 NOTE — Progress Notes (Signed)
   NURSING HOME LOCATION:  [facility] ROOM NUMBER:  121 D  CODE STATUS:  DNR  PCP:  Synthia Innocent NP  This is a nursing facility follow up visit for specific acute issue of possible eye infection.  Interim medical record and care since last SNF visit was updated with review of diagnostic studies and change in clinical status since last visit were documented.  HPI: Staff requested that I see the patient for possible eye infection.  Staff had observed some crusting of the left eye associated with some swelling and some yellow discharge in the mornings for 3 days.  The patient states she has had some eye symptoms for 2 weeks.  She validates she has had some vision change.  She denies any upper respiratory tract infectious or allergic symptoms.  She is a permanent resident of this facility with medical diagnoses of history of A-fib, history of bleeding gastric ulcer, history of stroke, history of diverticulosis, history of heart block, dyslipidemia, essential hypertension, osteoporosis, and history of melanoma.  Review of systems:  Constitutional: No fever, significant weight change, fatigue  ENT/mouth: No nasal congestion,  purulent discharge, earache, change in hearing, sore throat  Cardiovascular: No chest pain, palpitations, paroxysmal nocturnal dyspnea  Respiratory: No cough, sputum production   Dermatologic: No rash, pruritus, change in appearance of skin except OS lids Neurologic: No headache Allergy/immunology: No itchy/watery eyes, significant sneezing, urticaria, angioedema  Physical exam:  Pertinent or positive findings: There is a well healed vertical scar above the left eyebrow.  There is mild erythema of the upper lid of the left eye.  The lower lid is puffy.    She had some difficulty following commands in reference to extraocular motion testing, but EOM grossly intact w/o subjective pain.  Vision was grossly intact based on digit counting.  Bradycardia was  present.  She has interosseous wasting of the hands.  She has a tremor of the left hand.    General appearance:  no acute distress, increased work of breathing is present.   Lymphatic: No lymphadenopathy about the head, neck, axilla. Eyes: No scleral icterus. Ears:  External ear exam shows no significant lesions or deformities.   Nose:  External nasal examination shows no deformity or inflammation. Nasal mucosa are pink and moist without lesions, exudates Oral exam:  Lips and gums are healthy appearing. There is no oropharyngeal erythema or exudate. Neck:  No thyromegaly, masses, tenderness noted.    Heart:  No gallop, murmur, click, rub .  Lungs: Chest clear to auscultation without wheezes, rhonchi, rales, rubs. Skin: Warm & dry w/o tenting. No significant lesions or rash.  See summary under each active problem in the Problem List with associated updated therapeutic plan

## 2022-10-06 NOTE — Assessment & Plan Note (Signed)
Clinically rhythm regular but rate slow.

## 2022-10-09 ENCOUNTER — Other Ambulatory Visit (HOSPITAL_COMMUNITY)
Admission: RE | Admit: 2022-10-09 | Discharge: 2022-10-09 | Disposition: A | Discharge status: Home / Self Care | Payer: Medicare Other | Source: Skilled Nursing Facility | Attending: Adult Health | Admitting: Adult Health

## 2022-10-09 DIAGNOSIS — E039 Hypothyroidism, unspecified: Secondary | ICD-10-CM | POA: Diagnosis not present

## 2022-10-09 LAB — COMPREHENSIVE METABOLIC PANEL
ALT: 11 U/L (ref 0–44)
AST: 13 U/L — ABNORMAL LOW (ref 15–41)
Albumin: 3.1 g/dL — ABNORMAL LOW (ref 3.5–5.0)
Alkaline Phosphatase: 55 U/L (ref 38–126)
Anion gap: 6 (ref 5–15)
BUN: 21 mg/dL (ref 8–23)
CO2: 26 mmol/L (ref 22–32)
Calcium: 8.5 mg/dL — ABNORMAL LOW (ref 8.9–10.3)
Chloride: 106 mmol/L (ref 98–111)
Creatinine, Ser: 0.95 mg/dL (ref 0.44–1.00)
GFR, Estimated: 57 mL/min — ABNORMAL LOW (ref >60)
Glucose, Bld: 80 mg/dL (ref 70–99)
Potassium: 4 mmol/L (ref 3.5–5.1)
Sodium: 138 mmol/L (ref 135–145)
Total Bilirubin: 0.5 mg/dL (ref 0.3–1.2)
Total Protein: 5.8 g/dL — ABNORMAL LOW (ref 6.5–8.1)

## 2022-10-09 LAB — CBC
HCT: 35.4 % — ABNORMAL LOW (ref 36.0–46.0)
Hemoglobin: 11.1 g/dL — ABNORMAL LOW (ref 12.0–15.0)
MCH: 31.4 pg (ref 26.0–34.0)
MCHC: 31.4 g/dL (ref 30.0–36.0)
MCV: 100 fL (ref 80.0–100.0)
Platelets: 257 10*3/uL (ref 150–400)
RBC: 3.54 MIL/uL — ABNORMAL LOW (ref 3.87–5.11)
RDW: 12.4 % (ref 11.5–15.5)
WBC: 5.8 10*3/uL (ref 4.0–10.5)
nRBC: 0 % (ref 0.0–0.2)

## 2022-10-09 LAB — TSH: TSH: 3.081 u[IU]/mL (ref 0.350–4.500)

## 2022-10-13 DIAGNOSIS — R488 Other symbolic dysfunctions: Secondary | ICD-10-CM | POA: Diagnosis not present

## 2022-10-13 DIAGNOSIS — I69328 Other speech and language deficits following cerebral infarction: Secondary | ICD-10-CM | POA: Diagnosis not present

## 2022-10-16 DIAGNOSIS — R488 Other symbolic dysfunctions: Secondary | ICD-10-CM | POA: Diagnosis not present

## 2022-10-16 DIAGNOSIS — I69328 Other speech and language deficits following cerebral infarction: Secondary | ICD-10-CM | POA: Diagnosis not present

## 2022-10-17 DIAGNOSIS — R488 Other symbolic dysfunctions: Secondary | ICD-10-CM | POA: Diagnosis not present

## 2022-10-17 DIAGNOSIS — I69328 Other speech and language deficits following cerebral infarction: Secondary | ICD-10-CM | POA: Diagnosis not present

## 2022-10-18 DIAGNOSIS — R488 Other symbolic dysfunctions: Secondary | ICD-10-CM | POA: Diagnosis not present

## 2022-10-18 DIAGNOSIS — I69328 Other speech and language deficits following cerebral infarction: Secondary | ICD-10-CM | POA: Diagnosis not present

## 2022-10-19 DIAGNOSIS — I69328 Other speech and language deficits following cerebral infarction: Secondary | ICD-10-CM | POA: Diagnosis not present

## 2022-10-19 DIAGNOSIS — R488 Other symbolic dysfunctions: Secondary | ICD-10-CM | POA: Diagnosis not present

## 2022-10-20 DIAGNOSIS — I69328 Other speech and language deficits following cerebral infarction: Secondary | ICD-10-CM | POA: Diagnosis not present

## 2022-10-20 DIAGNOSIS — R488 Other symbolic dysfunctions: Secondary | ICD-10-CM | POA: Diagnosis not present

## 2022-10-23 DIAGNOSIS — I69328 Other speech and language deficits following cerebral infarction: Secondary | ICD-10-CM | POA: Diagnosis not present

## 2022-10-23 DIAGNOSIS — R488 Other symbolic dysfunctions: Secondary | ICD-10-CM | POA: Diagnosis not present

## 2022-10-25 DIAGNOSIS — I69328 Other speech and language deficits following cerebral infarction: Secondary | ICD-10-CM | POA: Diagnosis not present

## 2022-10-25 DIAGNOSIS — R488 Other symbolic dysfunctions: Secondary | ICD-10-CM | POA: Diagnosis not present

## 2022-10-27 ENCOUNTER — Non-Acute Institutional Stay (SKILLED_NURSING_FACILITY): Payer: Medicare Other | Admitting: Adult Health

## 2022-10-27 ENCOUNTER — Encounter: Payer: Self-pay | Admitting: Adult Health

## 2022-10-27 DIAGNOSIS — R29818 Other symptoms and signs involving the nervous system: Secondary | ICD-10-CM | POA: Diagnosis not present

## 2022-10-27 DIAGNOSIS — R4189 Other symptoms and signs involving cognitive functions and awareness: Secondary | ICD-10-CM

## 2022-10-27 DIAGNOSIS — N1832 Chronic kidney disease, stage 3b: Secondary | ICD-10-CM | POA: Diagnosis not present

## 2022-10-27 DIAGNOSIS — I700 Atherosclerosis of aorta: Secondary | ICD-10-CM | POA: Diagnosis not present

## 2022-10-27 NOTE — Progress Notes (Signed)
Location:  [facility] Room Number: 121D Place of Service:  SNF (31)   CODE STATUS: DNR  Allergies  Allergen Reactions   Septra Ds [Sulfamethoxazole-Trimethoprim]    Lisinopril Other (See Comments)    Cough    Chief Complaint  Patient presents with   Acute Visit    Care plan meeting    HPI:  We have come together for her care plan meeting. BIMS 14/15 mood 0/30 SLUMS 10/30. She is nonambulatory with one lowering to the floor. She requires mod assist with her adls. She is frequently incontinent of bladder and bowel. Dietary: requires set up for meals; regular diet appetite 50-100% weight is 114.6 pounds. Therapy: ST for cognition and memory. Activities: does participate. She will continue to be followed for her chronic illnesses including: Aortic atherosclerosis Neurocognitive deficits   Stage 3 chronic kidney disease  Past Medical History:  Diagnosis Date   Acute metabolic encephalopathy 07/2021   Atrial fibrillation (HCC)    Bleeding stomach ulcer 1980s?   CVA (cerebral vascular accident) (HCC) 07/2021   DIVERTICULOSIS, COLON 11/22/2006   Headache(784.0)    "very often; not regular" (11/25/2015)   Heart block    hx of   Hip fracture (HCC) 07/2021   History of blood transfusion 1980s   "when I had the bleeding ulcer"   HYPERLIPIDEMIA 11/22/2006   Hypertension    Melanoma (HCC)    LLE   Menopausal syndrome (hot flashes)    OSTEOPOROSIS 11/22/2006   Presence of permanent cardiac pacemaker    Vitamin D deficiency 08/21/2021    Past Surgical History:  Procedure Laterality Date   APPENDECTOMY     BUNIONECTOMY Right    CATARACT EXTRACTION W/ INTRAOCULAR LENS  IMPLANT, BILATERAL Bilateral 2000s   CHOLECYSTECTOMY OPEN     EP IMPLANTABLE DEVICE N/A 11/25/2015   Procedure: Pacemaker Implant;  Surgeon: Will Jorja Loa, MD;  Location: [facility] INVASIVE CV LAB;  Service: Cardiovascular;  Laterality: N/A;   FEMUR IM NAIL Left 07/08/2014   Procedure:  INTRAMEDULLARY (IM) NAIL ;  Surgeon: Cammy Copa, MD;  Location: [facility] ORS;  Service: Orthopedics;  Laterality: Left;   FRACTURE SURGERY     HIP ARTHROPLASTY Right 08/20/2021   Procedure: ARTHROPLASTY BIPOLAR HIP (HEMIARTHROPLASTY);  Surgeon: Myrene Galas, MD;  Location: [facility] OR;  Service: Orthopedics;  Laterality: Right;   IR KYPHO LUMBAR INC FX REDUCE BONE BX UNI/BIL CANNULATION INC/IMAGING  03/21/2021   PACEMAKER INSERTION     REVISION TOTAL HIP ARTHROPLASTY Left 06/2014    Social History   Socioeconomic History   Marital status: Widowed    Spouse name: Not on file   Number of children: 2   Years of education: 28   Highest education level: Not on file  Occupational History    Comment: retired  Tobacco Use   Smoking status: Never    Passive exposure: Never   Smokeless tobacco: Never  Vaping Use   Vaping Use: Never used  Substance and Sexual Activity   Alcohol use: No    Alcohol/week: 0.0 standard drinks of alcohol   Drug use: No   Sexual activity: Never  Other Topics Concern   Not on file  Social History Narrative   09/21/21 resides at [facility]      Lives alone, son lives beside her   Caffeine - none   Social Determinants of Health   Financial Resource Strain: Low Risk  (05/13/2020)   Overall Financial Resource Strain (CARDIA)  Difficulty of Paying Living Expenses: Not hard at all  Food Insecurity: No Food Insecurity (05/13/2020)   Hunger Vital Sign    Worried About Running Out of Food in the Last Year: Never true    Ran Out of Food in the Last Year: Never true  Transportation Needs: No Transportation Needs (05/13/2020)   PRAPARE - Administrator, Civil Service (Medical): No    Lack of Transportation (Non-Medical): No  Physical Activity: Inactive (05/13/2020)   Exercise Vital Sign    Days of Exercise per Week: 0 days    Minutes of Exercise per Session: 0 min  Stress: No Stress Concern Present (05/13/2020)   [employer] of Occupational  Health - Occupational Stress Questionnaire    Feeling of Stress : Not at all  Social Connections: Socially Isolated (05/13/2020)   Social Connection and Isolation Panel [NHANES]    Frequency of Communication with Friends and Family: More than three times a week    Frequency of Social Gatherings with Friends and Family: More than three times a week    Attends Religious Services: Never    Database administrator or Organizations: No    Attends Banker Meetings: Never    Marital Status: Widowed  Intimate Partner Violence: Not At Risk (05/13/2020)   Humiliation, Afraid, Rape, and Kick questionnaire    Fear of Current or Ex-Partner: No    Emotionally Abused: No    Physically Abused: No    Sexually Abused: No   Family History  Problem Relation Age of Onset   Alzheimer's disease Sister    Cardiomyopathy Brother       VITAL SIGNS BP (!) 156/69   Pulse 61   Temp 97.8 F (36.6 C)   Resp 16   Ht 5\' 5"  (1.651 m)   Wt 114 lb (51.7 kg)   SpO2 98%   BMI 18.97 kg/m   Outpatient Encounter Medications as of 10/27/2022  Medication Sig   Acetaminophen (TYLENOL ARTHRITIS PAIN PO) Take 650 mg by mouth 3 (three) times daily.   amiodarone (PACERONE) 200 MG tablet Take 1 tablet (200 mg total) by mouth daily.   aspirin 81 MG chewable tablet Chew 1 tablet (81 mg total) by mouth daily.   Camphor-Menthol-Methyl Sal (SALONPAS) 3.05-27-08 % PTCH Apply 1 patch topically daily. At 9 am   feeding supplement (BOOST HIGH PROTEIN) LIQD Take 1 Container by mouth in the morning and at bedtime. 0.06 gram- 1 kcal/mL; amt:   methocarbamol (ROBAXIN) 500 MG tablet Take 500 mg by mouth every 6 (six) hours as needed for muscle spasms.   NON FORMULARY Diet - Dysphagia 3   ondansetron (ZOFRAN-ODT) 4 MG disintegrating tablet Take 4 mg by mouth every 8 (eight) hours as needed for nausea or vomiting.   senna-docusate (SENNA S) 8.6-50 MG tablet Take 1 tablet by mouth 2 (two) times daily.   No  facility-administered encounter medications on file as of 10/27/2022.     SIGNIFICANT DIAGNOSTIC EXAMS  PREVIOUS  01-04-22: dexa scan: t score -4.184   NO NEW EXAMS   LABS REVIEWED; PREVIOUS   01-16-22; glucose 62; bun 19; creat 0.91; k+ 4.6; na++ 139; ca 8.7; gfr 60; vitamin D 29.35  04-20-22: wbc 5.9; hgb 12.4; hct 39.8; mcv 97.5 plt 204; glucose 86; bun 14; creat 0.83; k+ 4.0; na++ 140; ca 8.4; gfr >60; protein 6.3; albumin 3.4; tsh 3.839; iron 58; tibc 304; vitamin D 45.06     NO NEW LABS.  Review of Systems  Constitutional:  Negative for malaise/fatigue.  Respiratory:  Negative for cough and shortness of breath.   Cardiovascular:  Negative for chest pain, palpitations and leg swelling.  Gastrointestinal:  Negative for abdominal pain, constipation and heartburn.  Musculoskeletal:  Negative for back pain, joint pain and myalgias.  Skin: Negative.   Neurological:  Negative for dizziness.  Psychiatric/Behavioral:  The patient is not nervous/anxious.    Physical Exam Constitutional:      General: She is not in acute distress.    Appearance: She is underweight. She is not diaphoretic.  Neck:     Thyroid: No thyromegaly.  Cardiovascular:     Rate and Rhythm: Normal rate and regular rhythm.     Pulses: Normal pulses.     Heart sounds: Normal heart sounds.  Pulmonary:     Effort: Pulmonary effort is normal. No respiratory distress.     Breath sounds: Normal breath sounds.  Abdominal:     General: Bowel sounds are normal. There is no distension.     Palpations: Abdomen is soft.     Tenderness: There is no abdominal tenderness.  Musculoskeletal:        General: Normal range of motion.     Cervical back: Neck supple.     Right lower leg: Edema present.     Left lower leg: Edema present.  Lymphadenopathy:     Cervical: No cervical adenopathy.  Skin:    General: Skin is warm and dry.  Neurological:     Mental Status: She is alert. Mental status is at baseline.   Psychiatric:        Mood and Affect: Mood normal.     ASSESSMENT/ PLAN:  TODAY  Aortic atherosclerosis Neurocognitive deficits Stage 3 chronic kidney disease   Will continue current medications Will continue current plan of care Will continue to monitor her status  Time spent with patient: 40 minutes: medications; dietary; activities; therapy     Synthia Innocent NP [facility]  call [phone]

## 2022-10-31 IMAGING — CT CT HEAD CODE STROKE
4 series · 16 of 47 positions shown, 18 images · non-contrast
Comparison: 08/10/2021

CLINICAL DATA: Code stroke. Neuro deficit, acute, stroke suspected.
Found on the ground with speech disturbance.



[Series 2: head wo · axial · 0.42mm/px · z∈[-160,-60]mm · 6 of 30 slices shown, 8 images]
[im 5/30  brain]
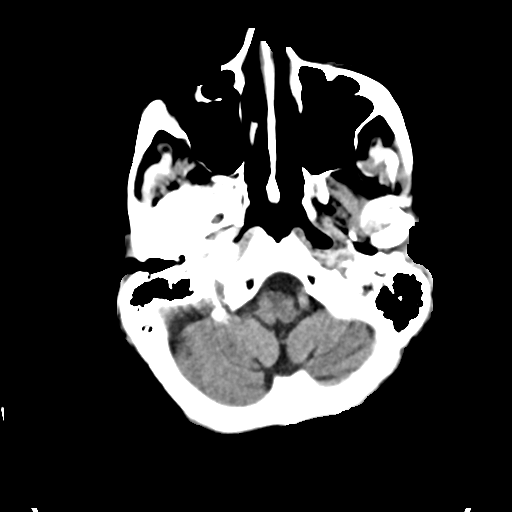
[im 5/30  bone]
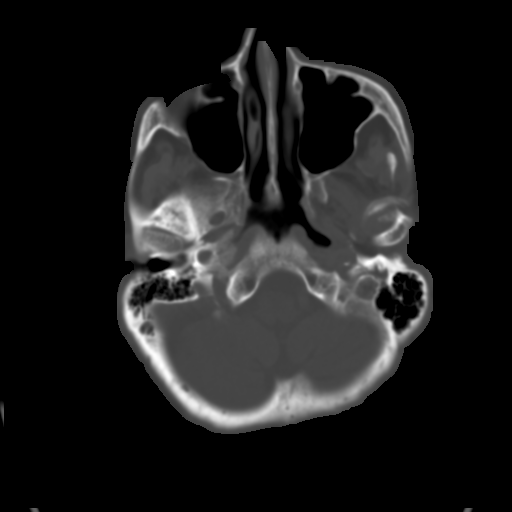
[im 9/30  brain]
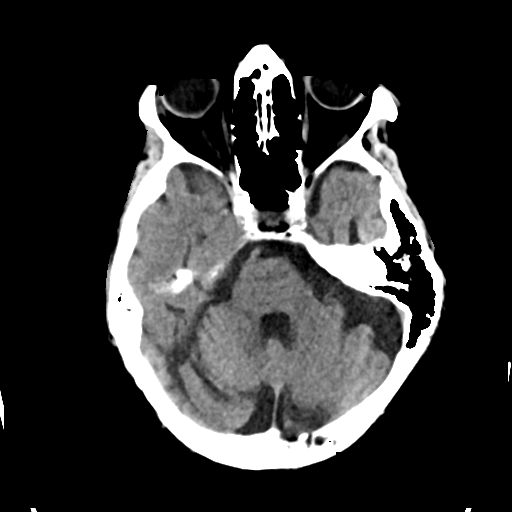
[im 13/30  brain]
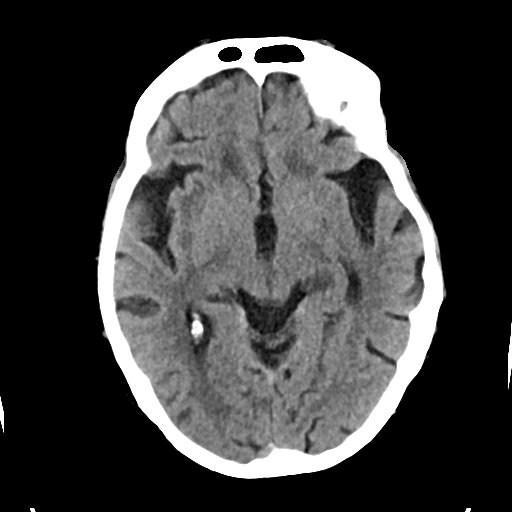
[im 17/30  brain]
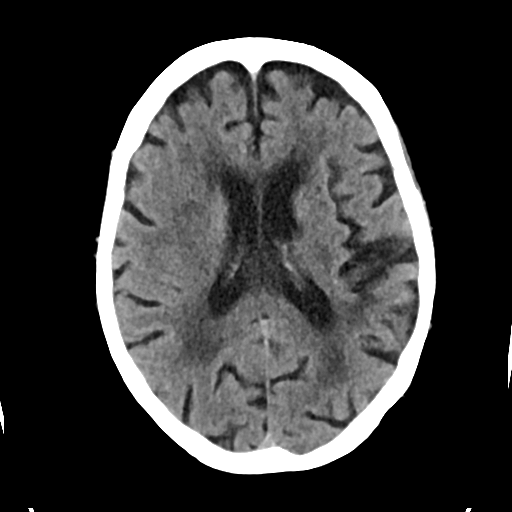
[im 21/30  brain]
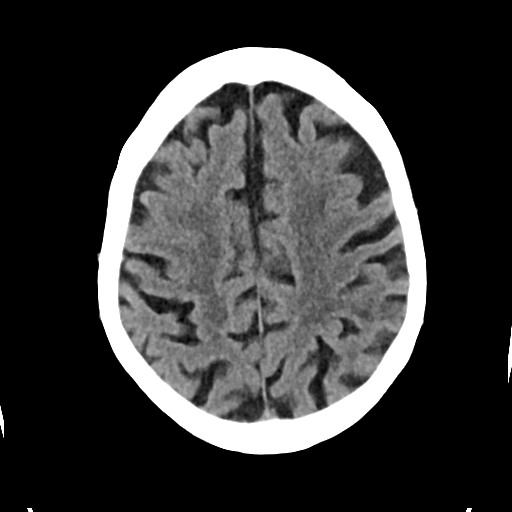
[im 21/30  bone]
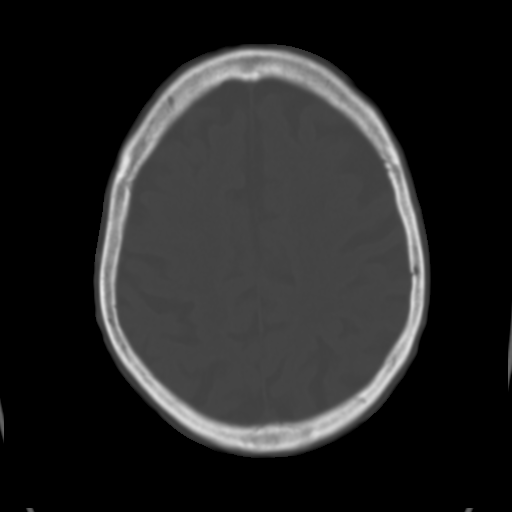
[im 25/30  brain]
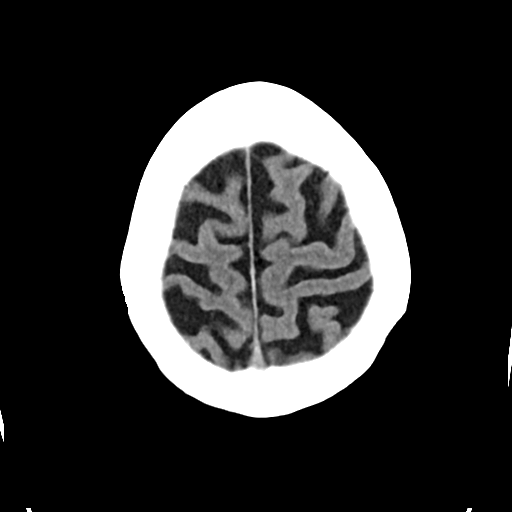

[Series 4: head bone · axial · 0.42mm/px · z∈[-166,-114]mm · 4 of 78 slices shown]
[im 8/78  bone]
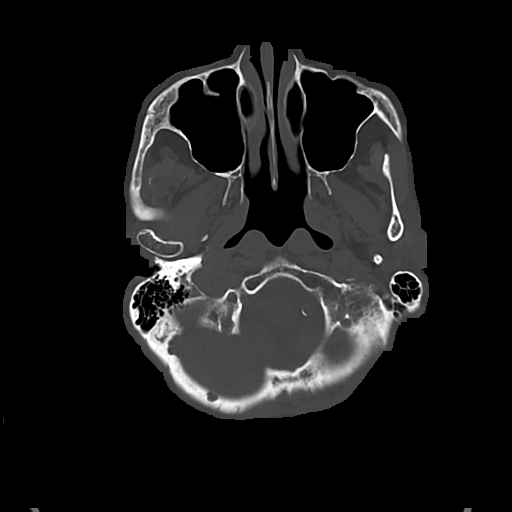
[im 15/78  bone]
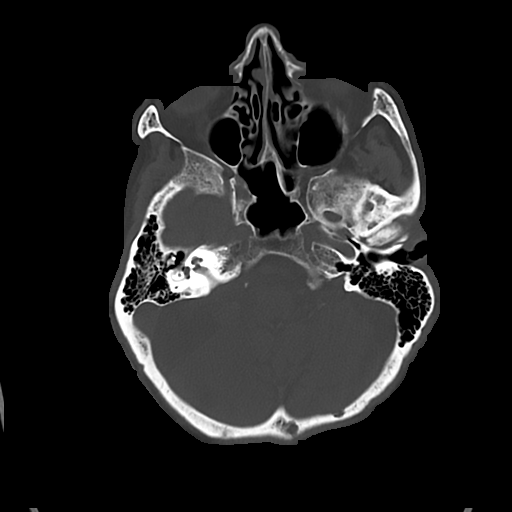
[im 26/78  bone]
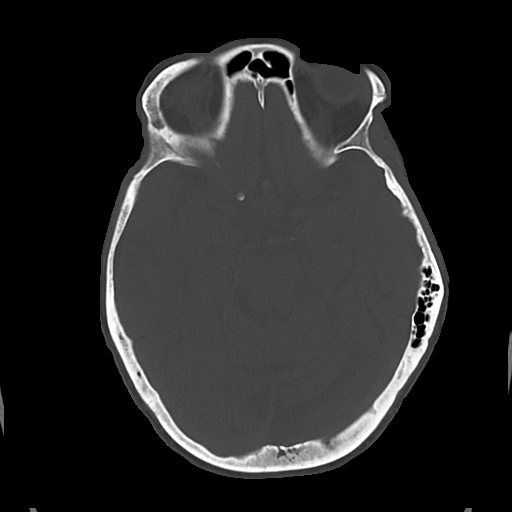
[im 34/78  bone]
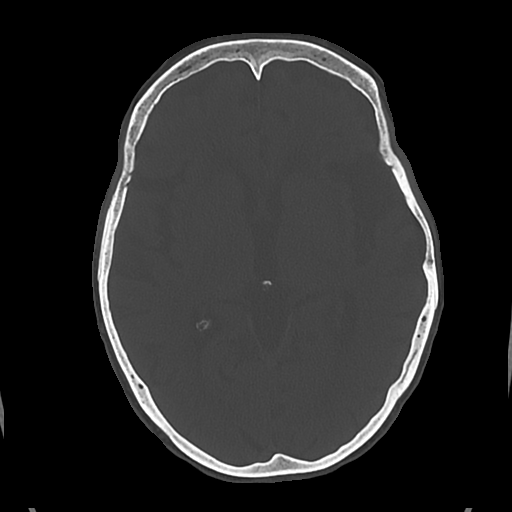

[Series 5: cor soft · coronal · 0.33mm/px · 3 of 67 slices shown]
[im 23/67  brain]
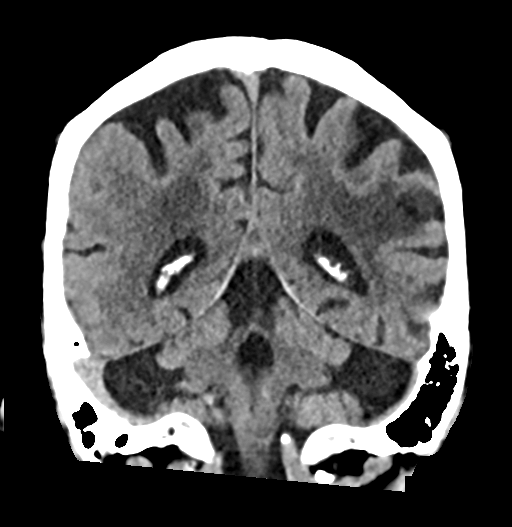
[im 30/67  brain]
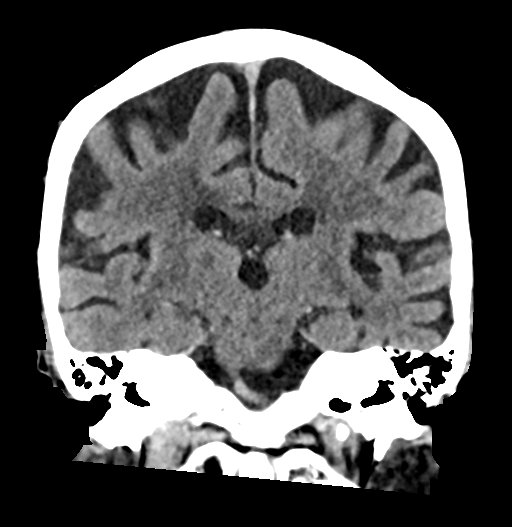
[im 37/67  brain]
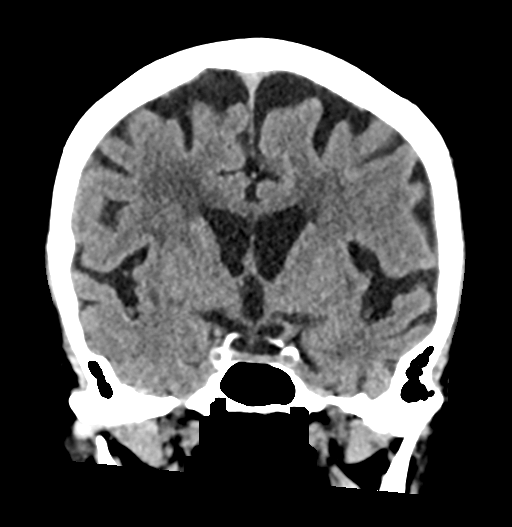

[Series 6: sag soft · sagittal · 0.32mm/px · 3 of 53 slices shown]
[im 18/53  brain]
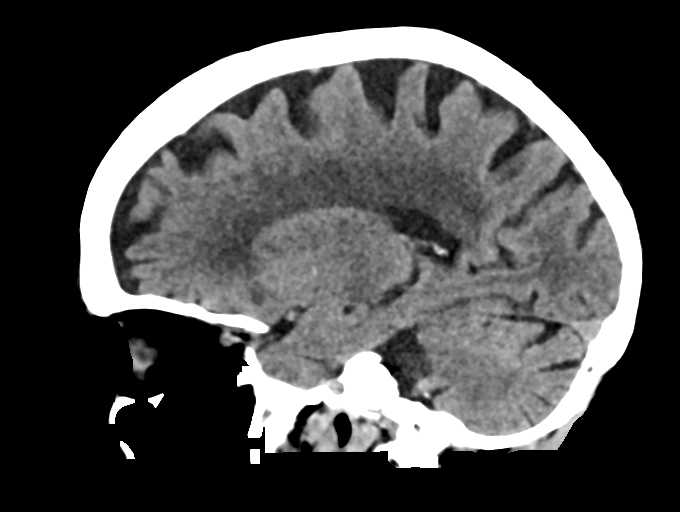
[im 27/53  brain]
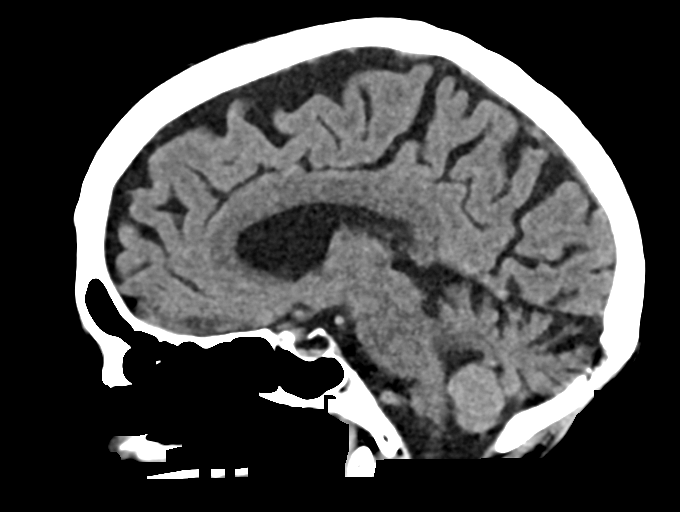
[im 35/53  brain]
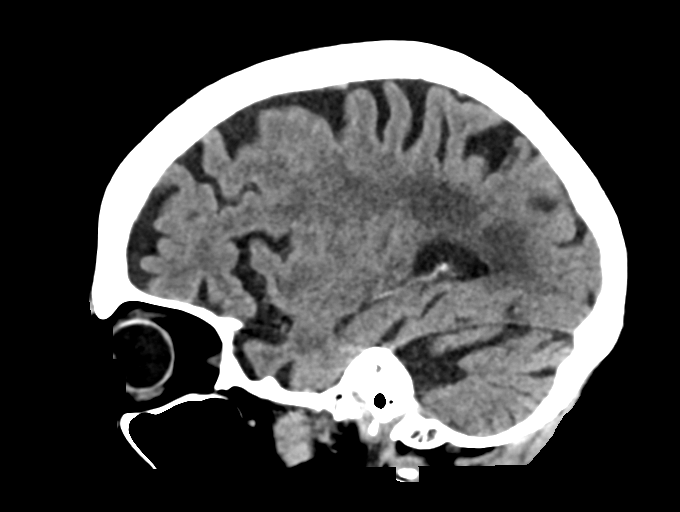

[16 of 47 positions shown; findings below may reference images not displayed]

FINDINGS: Brain: No acute CT finding. Generalized atrophy. Chronic
small-vessel ischemic changes of the white matter. Old left parietal
cortical and subcortical infarction. No mass, hemorrhage,
hydrocephalus or extra-axial collection.

Vascular: There is atherosclerotic calcification of the major
vessels at the base of the brain.

Skull: Negative

Sinuses/Orbits: Clear except for a few scattered retention cysts.

Other: None

ASPECTS (Alberta Stroke Program Early CT Score)

- Ganglionic level infarction (caudate, lentiform nuclei, internal
capsule, insula, M1-M3 cortex): 7

- Supraganglionic infarction (M4-M6 cortex): 3, allowing for the old
infarction.

Total score (0-10 with 10 being normal): 10, allowing for the old
left parietal infarction.
IMPRESSION: 1. No acute CT finding. Atrophy and chronic small-vessel ischemic
changes. Old left parietal cortical infarction.
2. ASPECTS is 10, allowing for the old infarction.
3. These results were communicated to [REDACTED] at [DATE] on
08/17/2021 by text page via the [employer] messaging system.

## 2022-10-31 IMAGING — CR DG CHEST 1V
1 series · 1 of 1 positions shown · non-contrast
Comparison: Chest radiograph dated 04/06/2021.

CLINICAL DATA: Code stroke.

EXAM:
CHEST  1 VIEW

[chest ap]
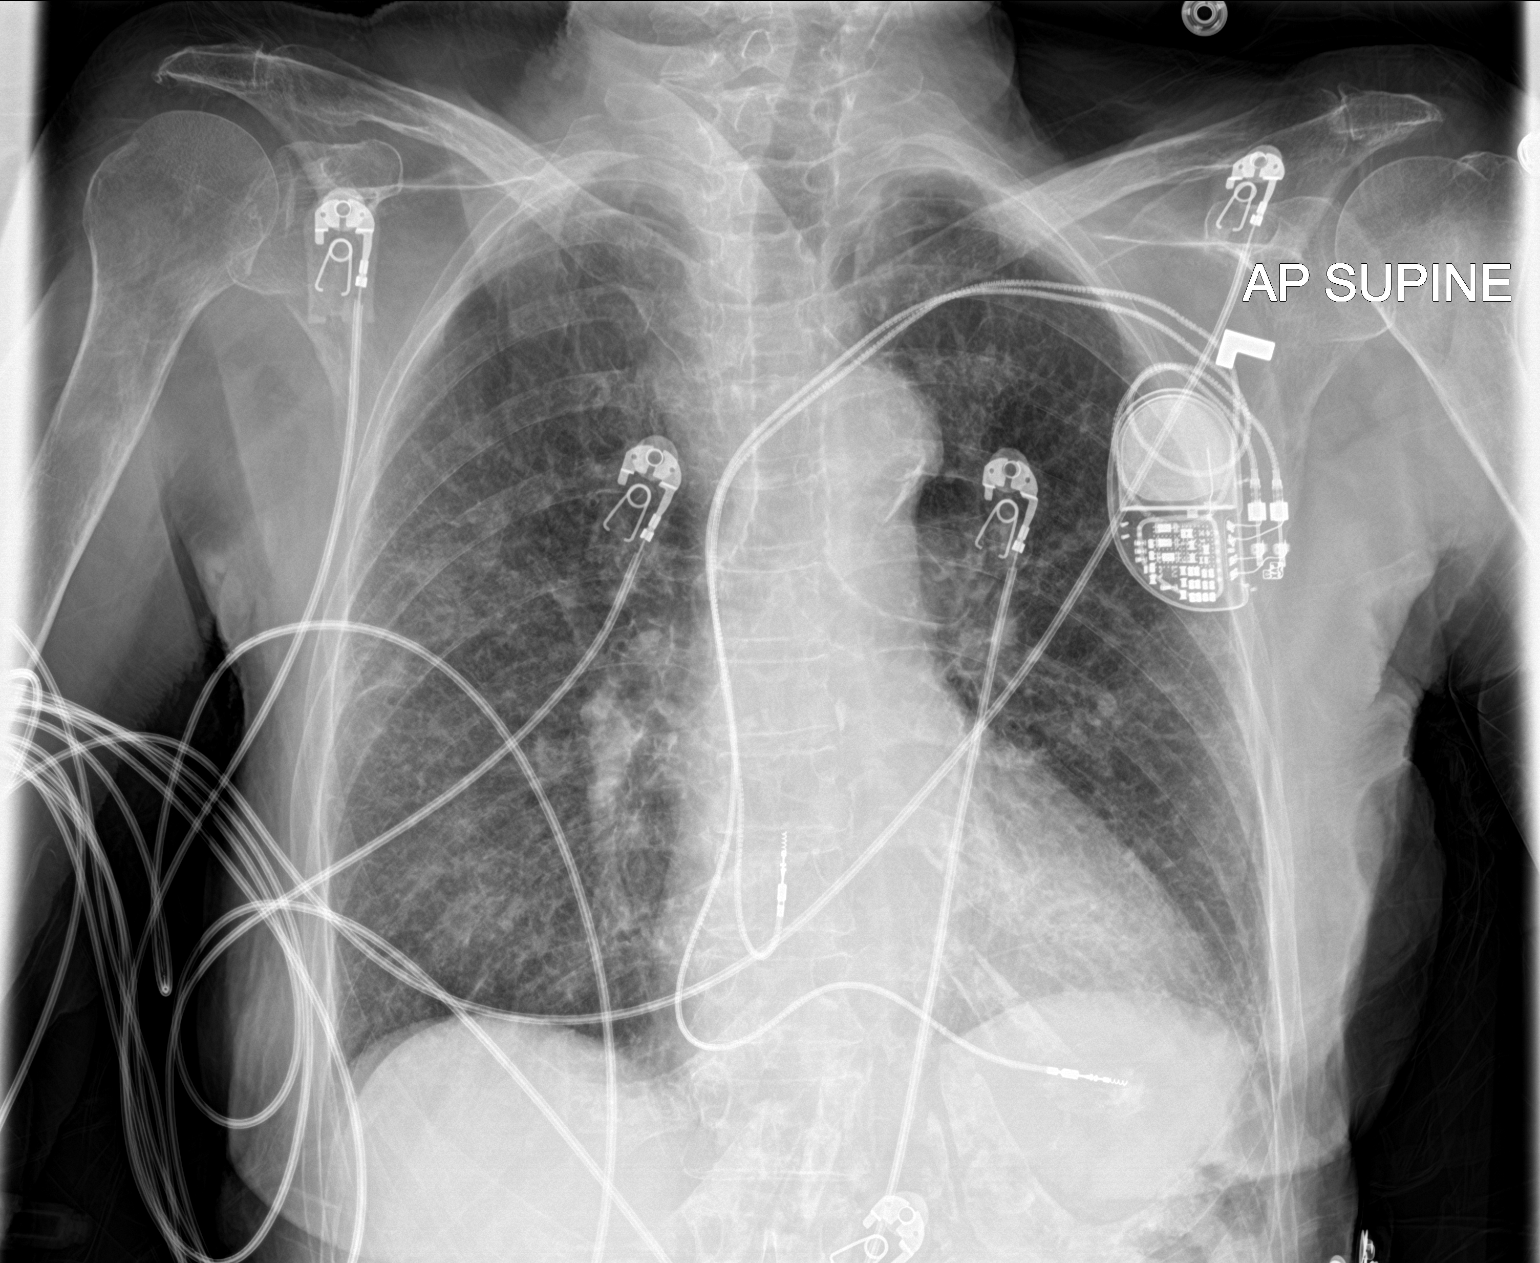

[1 of 1 positions shown; findings below may reference images not displayed]

FINDINGS: No focal consolidation, pleural effusion, pneumothorax. There is
diffuse chronic interstitial coarsening and bronchitic changes.
Faint densities in the lower lung fields, likely atelectasis. The
cardiac silhouette is within normal limits. Atherosclerotic
calcification of the aorta. Left pectoral pacemaker device.
Degenerative changes of the spine. No acute osseous pathology.
IMPRESSION: No acute cardiopulmonary process.

## 2022-10-31 IMAGING — CR DG HIP (WITH OR WITHOUT PELVIS) 2-3V*R*
3 series · 3 of 3 positions shown · non-contrast
Comparison: CT scan 08/02/2021

CLINICAL DATA: Fell.  Right hip pain.

EXAM:
DG HIP (WITH OR WITHOUT PELVIS) 2-3V RIGHT

[pelvis ap]
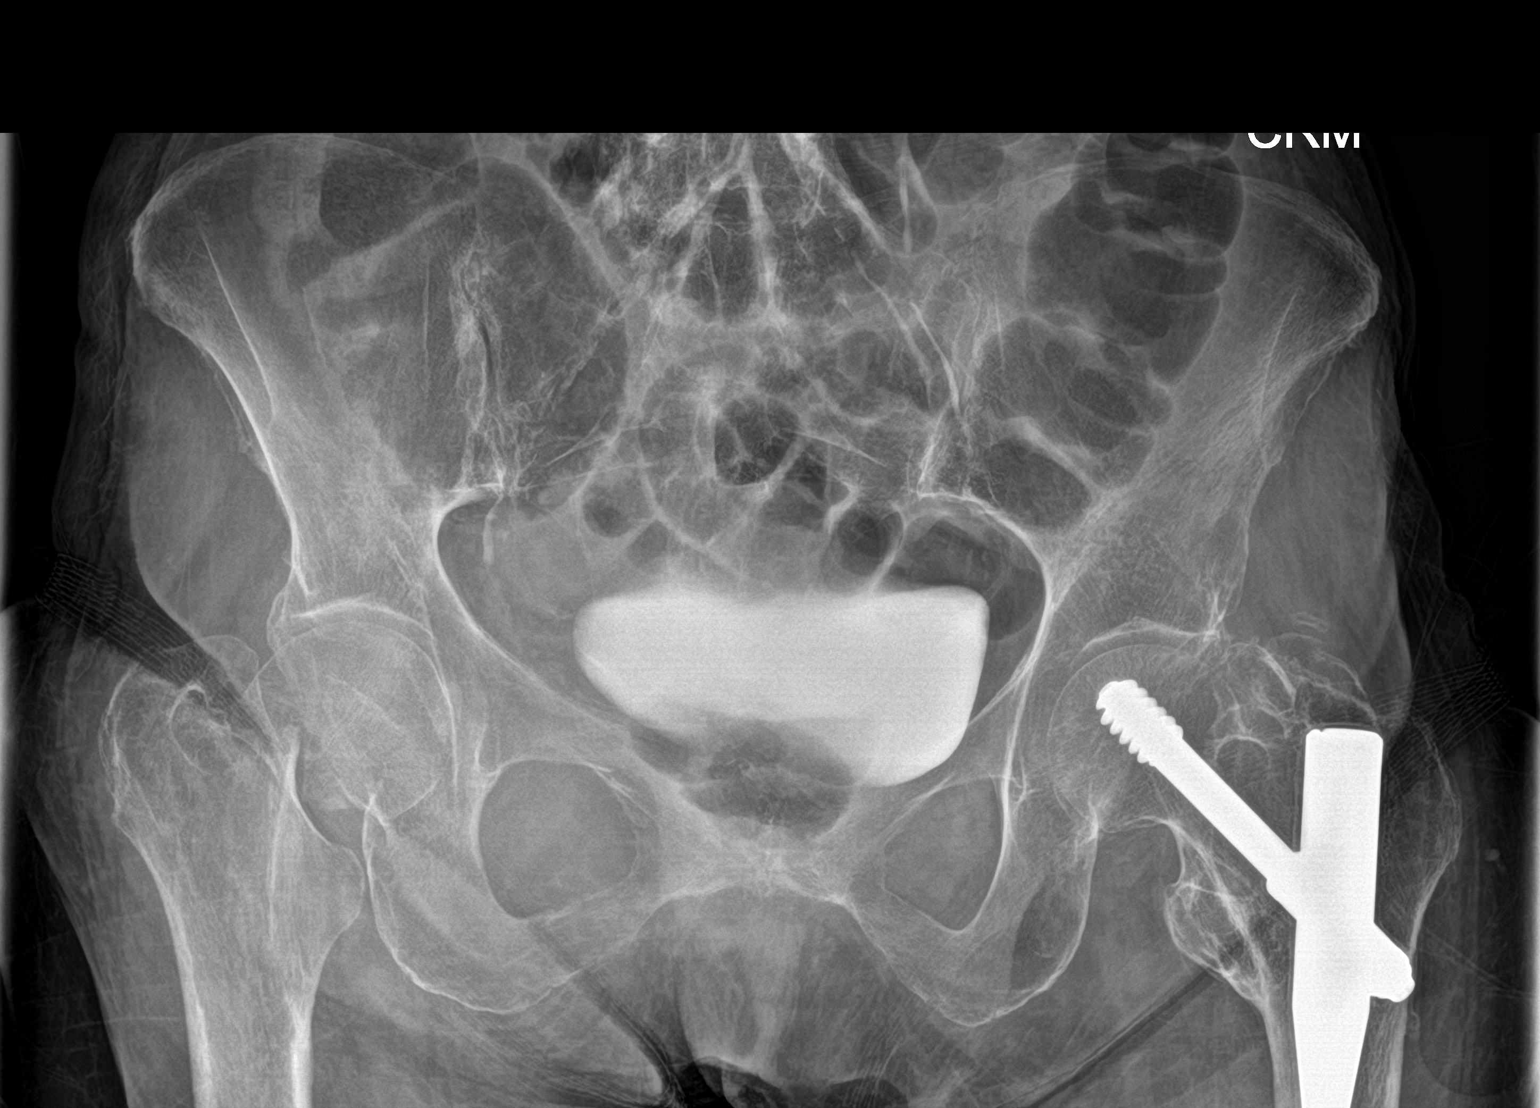

[hip ap]
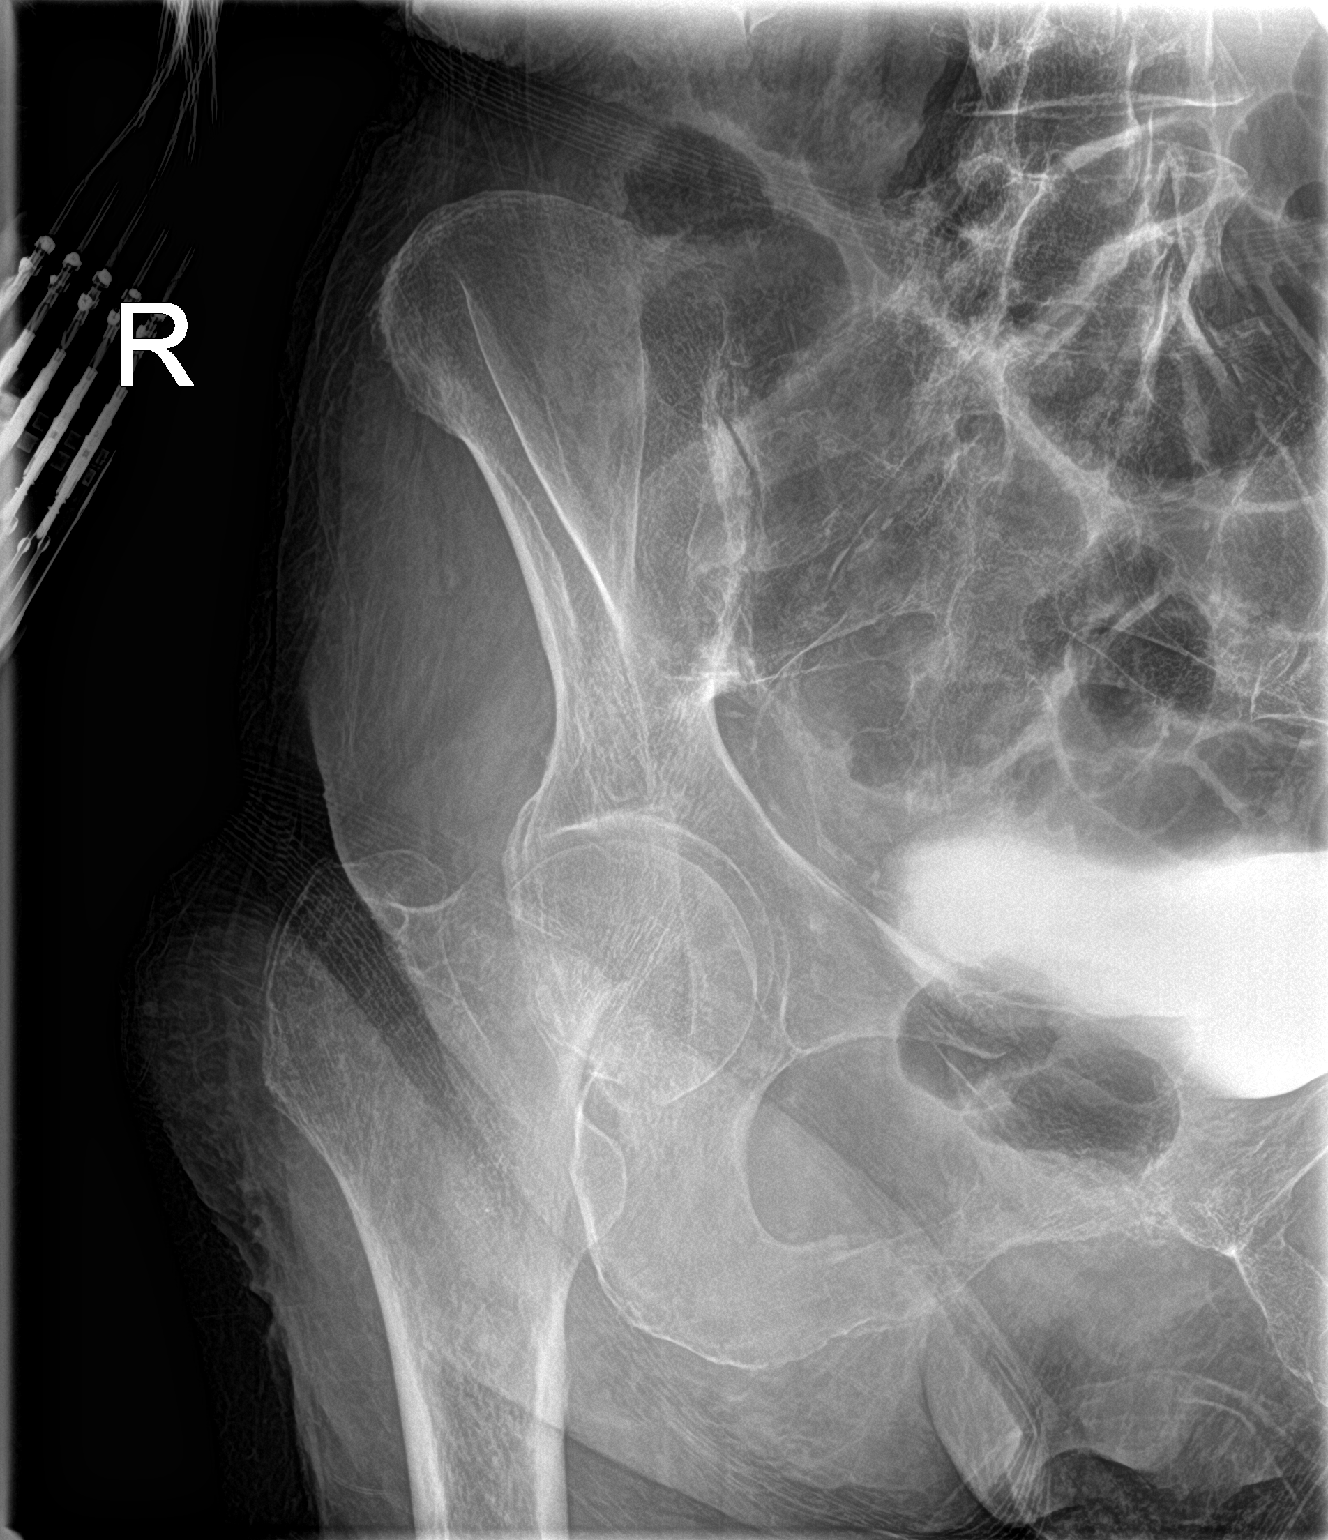

[hip lat]
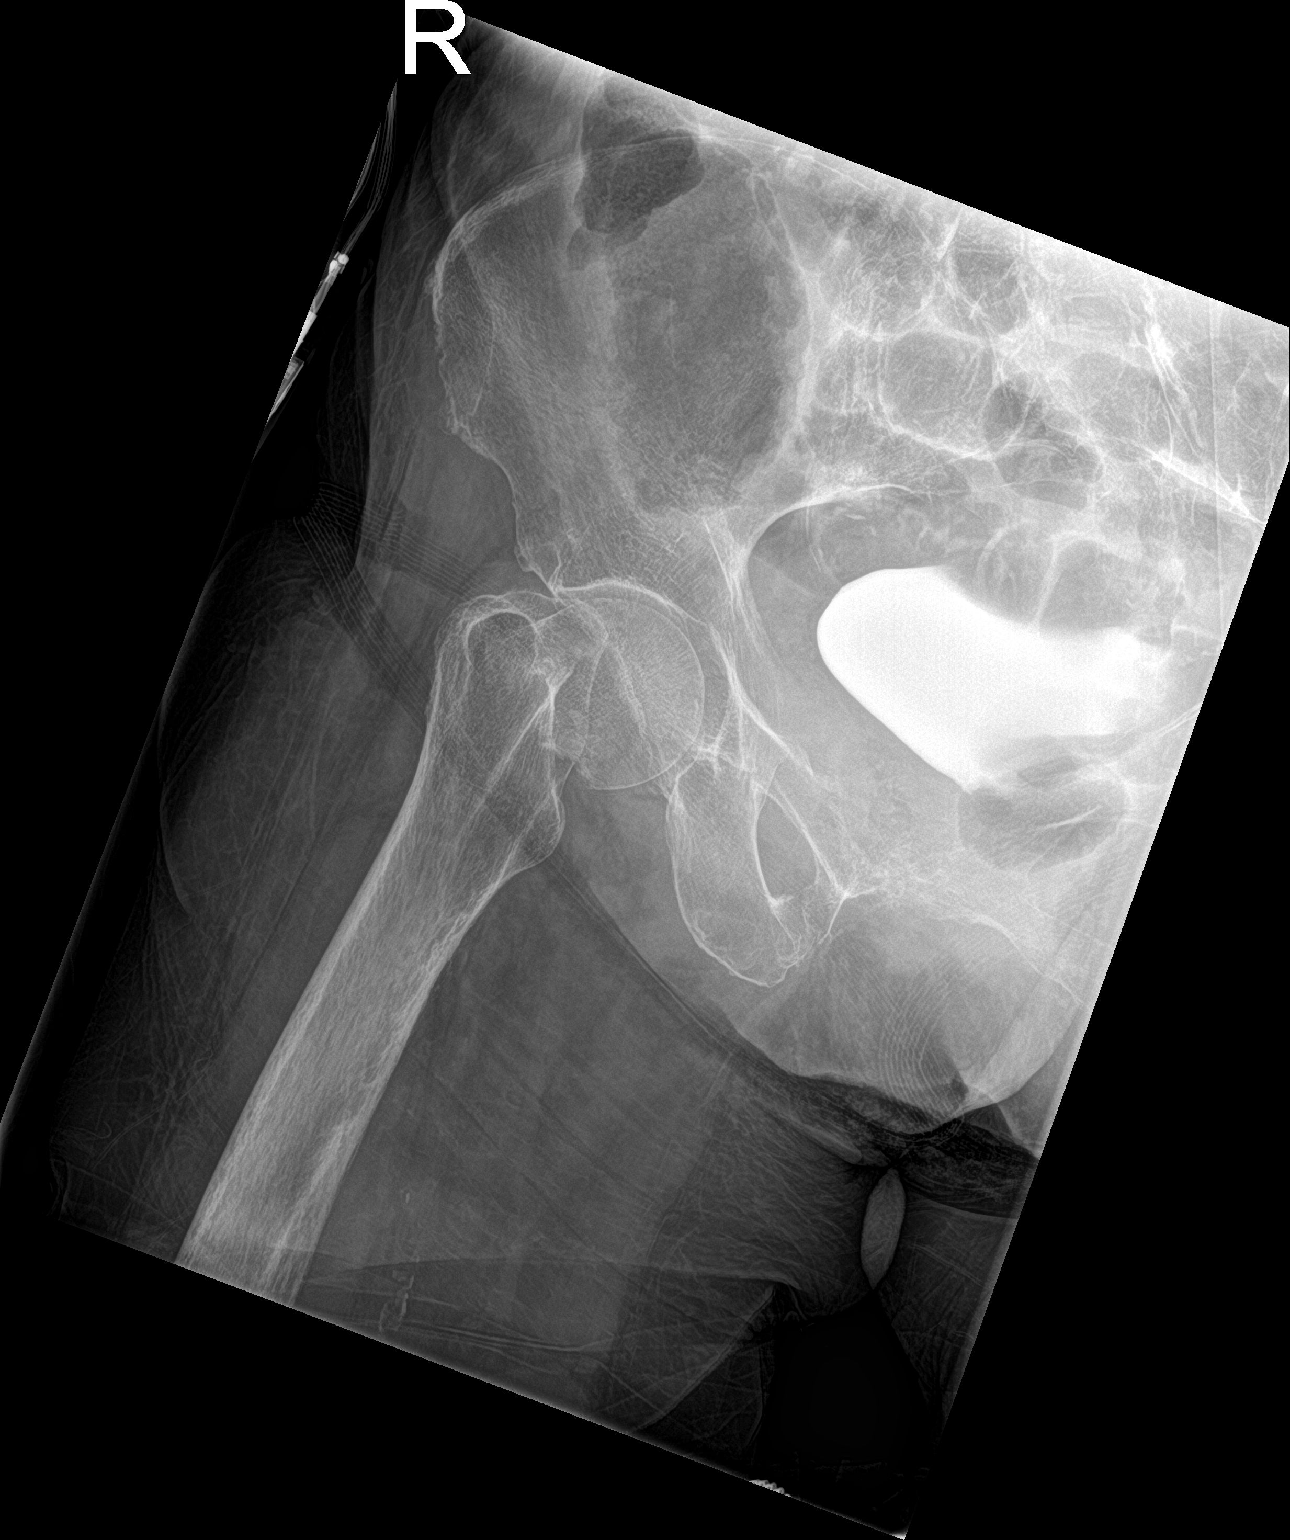

[3 of 3 positions shown; findings below may reference images not displayed]

FINDINGS: Acute displaced right femoral neck fracture.

Stable left hip hardware. The pubic symphysis and SI joints are
intact. The bony pelvis is grossly intact.
IMPRESSION: Acute displaced right femoral neck fracture.

## 2022-11-06 ENCOUNTER — Encounter: Payer: Self-pay | Admitting: Adult Health

## 2022-11-06 ENCOUNTER — Non-Acute Institutional Stay (INDEPENDENT_AMBULATORY_CARE_PROVIDER_SITE_OTHER): Payer: Medicare Other | Admitting: Adult Health

## 2022-11-06 DIAGNOSIS — Z0000 Encounter for general adult medical examination without abnormal findings: Secondary | ICD-10-CM

## 2022-11-06 NOTE — Patient Instructions (Signed)
  [name] Small , Thank you for taking time to come for your Medicare Wellness Visit. I appreciate your ongoing commitment to your health goals. Please review the following plan we discussed and let me know if I can assist you in the future.   These are the goals we discussed:  Goals      Absence of Fall and Fall-Related Injury     Evidence-based guidance:  Assess fall risk using a validated tool when available. Consider balance and gait impairment, muscle weakness, diminished vision or hearing, environmental hazards, presence of urinary or bowel urgency and/or incontinence.  Communicate fall injury risk to interprofessional healthcare team.  Develop a fall prevention plan with the patient and family.  Promote use of personal vision and auditory aids.  Promote reorientation, appropriate sensory stimulation, and routines to decrease risk of fall when changes in mental status are present.  Assess assistance level required for safe and effective self-care; consider referral for home care.  Encourage physical activity, such as performance of self-care at highest level of ability, strength and balance exercise program, and provision of appropriate assistive devices; refer to rehabilitation therapy.  Refer to community-based fall prevention program where available.  If fall occurs, determine the cause and revise fall injury prevention plan.  Regularly review medication contribution to fall risk; consider risk related to polypharmacy and age.  Refer to pharmacist for consultation when concerns about medications are revealed.  Balance adequate pain management with potential for oversedation.  Provide guidance related to environmental modifications.  Consider supplementation with Vitamin D.   Notes:      DIET - INCREASE WATER INTAKE     General - Client will not be readmitted within 30 days (C-SNP)        This is a list of the screening recommended for you and due dates:  Health Maintenance  Topic  Date Due   COVID-19 Vaccine (5 - 2023-24 season) 05/10/2022   Flu Shot  12/21/2022   Medicare Annual Wellness Visit  11/06/2023   DTaP/Tdap/Td vaccine (3 - Td or Tdap) 11/12/2031   Pneumonia Vaccine  Completed   DEXA scan (bone density measurement)  Completed   Zoster (Shingles) Vaccine  Completed   HPV Vaccine  Aged Out   Mammogram  Discontinued

## 2022-11-06 NOTE — Progress Notes (Signed)
Subjective:   [name] Small is a 89 y.o. female who presents for Medicare Annual (Subsequent) preventive examination.  Review of Systems    Review of Systems  Constitutional:  Negative for malaise/fatigue.  Respiratory:  Negative for cough and shortness of breath.   Cardiovascular:  Negative for chest pain, palpitations and leg swelling.  Gastrointestinal:  Negative for abdominal pain, constipation and heartburn.  Musculoskeletal:  Negative for back pain, joint pain and myalgias.  Skin: Negative.   Neurological:  Negative for dizziness.  Psychiatric/Behavioral:  The patient is not nervous/anxious.     Cardiac Risk Factors include: advanced age (>40men, >58 women);sedentary lifestyle;hypertension     Objective:    Today's Vitals   11/06/22 1054  BP: 128/61  Pulse: 61  Resp: 20  Temp: 98.4 F (36.9 C)  SpO2: 94%  Weight: 114 lb (51.7 kg)  Height: 5\' 5"  (1.651 m)   Body mass index is 18.97 kg/m.     10/27/2022   11:31 AM 07/18/2022    2:45 PM 07/05/2022    9:59 AM 06/12/2022   11:31 AM 05/19/2022    3:31 PM 05/05/2022    9:32 AM 11/24/2021   11:14 AM  Advanced Directives  Does Patient Have a Medical Advance Directive? Yes Yes Yes Yes Yes Yes Yes  Type of Estate agent of [place];Out of facility DNR (pink MOST or yellow form) Healthcare Power of [place];Out of facility DNR (pink MOST or yellow form) Healthcare Power of [place];Out of facility DNR (pink MOST or yellow form) Healthcare Power of [place];Out of facility DNR (pink MOST or yellow form) Healthcare Power of [place];Out of facility DNR (pink MOST or yellow form) Out of facility DNR (pink MOST or yellow form) Healthcare Power of [place];Out of facility DNR (pink MOST or yellow form)  Does patient want to make changes to medical advance directive? No - Patient declined No - Patient declined No - Patient declined No - Patient declined No - Patient declined No - Patient declined No - Patient  declined  Copy of Healthcare Power of Attorney in Chart? Yes - validated most recent copy scanned in chart (See row information) Yes - validated most recent copy scanned in chart (See row information) Yes - validated most recent copy scanned in chart (See row information) Yes - validated most recent copy scanned in chart (See row information) Yes - validated most recent copy scanned in chart (See row information)  Yes - validated most recent copy scanned in chart (See row information)  Pre-existing out of facility DNR order (yellow form or pink MOST form) Pink MOST form placed in chart (order not valid for inpatient use) Pink MOST form placed in chart (order not valid for inpatient use) Pink MOST form placed in chart (order not valid for inpatient use) Pink MOST form placed in chart (order not valid for inpatient use) Pink MOST form placed in chart (order not valid for inpatient use)  Pink MOST form placed in chart (order not valid for inpatient use)    Current Medications (verified) Outpatient Encounter Medications as of 11/06/2022  Medication Sig   Acetaminophen (TYLENOL ARTHRITIS PAIN PO) Take 650 mg by mouth 3 (three) times daily.   amiodarone (PACERONE) 200 MG tablet Take 1 tablet (200 mg total) by mouth daily.   aspirin 81 MG chewable tablet Chew 1 tablet (81 mg total) by mouth daily.   Camphor-Menthol-Methyl Sal (SALONPAS) 3.05-27-08 % PTCH Apply 1 patch topically daily. At 9 am   feeding  supplement (BOOST HIGH PROTEIN) LIQD Take 1 Container by mouth in the morning and at bedtime. 0.06 gram- 1 kcal/mL; amt:   methocarbamol (ROBAXIN) 500 MG tablet Take 500 mg by mouth every 6 (six) hours as needed for muscle spasms.   NON FORMULARY Diet - Dysphagia 3   ondansetron (ZOFRAN-ODT) 4 MG disintegrating tablet Take 4 mg by mouth every 8 (eight) hours as needed for nausea or vomiting.   senna-docusate (SENNA S) 8.6-50 MG tablet Take 1 tablet by mouth 2 (two) times daily.   No facility-administered  encounter medications on file as of 11/06/2022.    Allergies (verified) Septra ds [sulfamethoxazole-trimethoprim] and Lisinopril   History: Past Medical History:  Diagnosis Date   Acute metabolic encephalopathy 07/2021   Atrial fibrillation (HCC)    Bleeding stomach ulcer 1980s?   CVA (cerebral vascular accident) (HCC) 07/2021   DIVERTICULOSIS, COLON 11/22/2006   Headache(784.0)    "very often; not regular" (11/25/2015)   Heart block    hx of   Hip fracture (HCC) 07/2021   History of blood transfusion 1980s   "when I had the bleeding ulcer"   HYPERLIPIDEMIA 11/22/2006   Hypertension    Melanoma (HCC)    LLE   Menopausal syndrome (hot flashes)    OSTEOPOROSIS 11/22/2006   Presence of permanent cardiac pacemaker    Vitamin D deficiency 08/21/2021   Past Surgical History:  Procedure Laterality Date   APPENDECTOMY     BUNIONECTOMY Right    CATARACT EXTRACTION W/ INTRAOCULAR LENS  IMPLANT, BILATERAL Bilateral 2000s   CHOLECYSTECTOMY OPEN     EP IMPLANTABLE DEVICE N/A 11/25/2015   Procedure: Pacemaker Implant;  Surgeon: Will Jorja Loa, MD;  Location: [facility];  Service: Cardiovascular;  Laterality: N/A;   FEMUR IM NAIL Left 07/08/2014   Procedure: INTRAMEDULLARY (IM) NAIL ;  Surgeon: Cammy Copa, MD;  Location: [facility] ORS;  Service: Orthopedics;  Laterality: Left;   FRACTURE SURGERY     HIP ARTHROPLASTY Right 08/20/2021   Procedure: ARTHROPLASTY BIPOLAR HIP (HEMIARTHROPLASTY);  Surgeon: Myrene Galas, MD;  Location: [facility] OR;  Service: Orthopedics;  Laterality: Right;   IR KYPHO LUMBAR INC FX REDUCE BONE BX UNI/BIL CANNULATION INC/IMAGING  03/21/2021   PACEMAKER INSERTION     REVISION TOTAL HIP ARTHROPLASTY Left 06/2014   Family History  Problem Relation Age of Onset   Alzheimer's disease Sister    Cardiomyopathy Brother    Social History   Socioeconomic History   Marital status: Widowed    Spouse name: Not on file   Number of children: 2   Years of  education: 49   Highest education level: Not on file  Occupational History    Comment: retired  Tobacco Use   Smoking status: Never    Passive exposure: Never   Smokeless tobacco: Never  Vaping Use   Vaping Use: Never used  Substance and Sexual Activity   Alcohol use: No    Alcohol/week: 0.0 standard drinks of alcohol   Drug use: No   Sexual activity: Never  Other Topics Concern   Not on file  Social History Narrative   09/21/21 resides at [facility]      Lives alone, son lives beside her   Caffeine - none   Social Determinants of Health   Financial Resource Strain: Low Risk  (05/13/2020)   Overall Financial Resource Strain (CARDIA)    Difficulty of Paying Living Expenses: Not hard at all  Food Insecurity: No Food Insecurity (05/13/2020)  Hunger Vital Sign    Worried About Running Out of Food in the Last Year: Never true    Ran Out of Food in the Last Year: Never true  Transportation Needs: No Transportation Needs (05/13/2020)   PRAPARE - Administrator, Civil Service (Medical): No    Lack of Transportation (Non-Medical): No  Physical Activity: Inactive (05/13/2020)   Exercise Vital Sign    Days of Exercise per Week: 0 days    Minutes of Exercise per Session: 0 min  Stress: No Stress Concern Present (05/13/2020)   [employer] of Occupational Health - Occupational Stress Questionnaire    Feeling of Stress : Not at all  Social Connections: Socially Isolated (05/13/2020)   Social Connection and Isolation Panel [NHANES]    Frequency of Communication with Friends and Family: More than three times a week    Frequency of Social Gatherings with Friends and Family: More than three times a week    Attends Religious Services: Never    Database administrator or Organizations: No    Attends Banker Meetings: Never    Marital Status: Widowed    Tobacco Counseling Counseling given: Not Answered   Clinical Intake:  Pre-visit preparation completed:  Yes  Pain : No/denies pain     BMI - recorded: 18.97 Nutritional Status: BMI <19  Underweight Nutritional Risks: Unintentional weight loss, Failure to thrive Diabetes: No  How often do you need to have someone help you when you read instructions, pamphlets, or other written materials from your doctor or pharmacy?: 5 - Always  Diabetic?no  Interpreter Needed?: No  Comments: long term resident of [facility]   Activities of Daily Living    11/06/2022   11:00 AM  In your present state of health, do you have any difficulty performing the following activities:  Hearing? 0  Vision? 0  Difficulty concentrating or making decisions? 1  Walking or climbing stairs? 1  Dressing or bathing? 1  Doing errands, shopping? 1  Preparing Food and eating ? Y  Using the Toilet? Y  In the past six months, have you accidently leaked urine? Y  Do you have problems with loss of bowel control? Y  Managing your Medications? Y  Managing your Finances? Y  Housekeeping or managing your Housekeeping? Y    Patient Care Team: Sharee Holster, NP as PCP - General (Geriatric Medicine) Wendall Stade, MD as PCP - Cardiology (Cardiology) Regan Lemming, MD as PCP - Electrophysiology (Cardiology)  Indicate any recent Medical Services you may have received from other than [facility] providers in the past year (date may be approximate).     Assessment:   This is a routine wellness examination for [place].  Hearing/Vision screen No results found.  Dietary issues and exercise activities discussed: Exercise limited by: None identified   Goals Addressed             This Visit's Progress    Absence of Fall and Fall-Related Injury   On track    Evidence-based guidance:  Assess fall risk using a validated tool when available. Consider balance and gait impairment, muscle weakness, diminished vision or hearing, environmental hazards, presence of urinary or bowel urgency and/or incontinence.  Communicate fall  injury risk to interprofessional healthcare team.  Develop a fall prevention plan with the patient and family.  Promote use of personal vision and auditory aids.  Promote reorientation, appropriate sensory stimulation, and routines to decrease risk of fall when changes in  mental status are present.  Assess assistance level required for safe and effective self-care; consider referral for home care.  Encourage physical activity, such as performance of self-care at highest level of ability, strength and balance exercise program, and provision of appropriate assistive devices; refer to rehabilitation therapy.  Refer to community-based fall prevention program where available.  If fall occurs, determine the cause and revise fall injury prevention plan.  Regularly review medication contribution to fall risk; consider risk related to polypharmacy and age.  Refer to pharmacist for consultation when concerns about medications are revealed.  Balance adequate pain management with potential for oversedation.  Provide guidance related to environmental modifications.  Consider supplementation with Vitamin D.   Notes:      DIET - INCREASE WATER INTAKE   On track    General - Client will not be readmitted within 30 days (C-SNP)   On track      Depression Screen    11/06/2022   10:57 AM 07/18/2022    2:42 PM 06/19/2022    9:07 AM 02/27/2022   11:36 AM 11/02/2021    2:32 PM 05/13/2020    9:56 AM 10/31/2016    8:53 AM  PHQ 2/9 Scores  PHQ - 2 Score 0 0 0 0 0 0 0  PHQ- 9 Score 0  0 0  0     Fall Risk    11/06/2022   10:56 AM 07/18/2022    2:41 PM 06/12/2022   11:31 AM 05/05/2022    9:32 AM 02/27/2022   11:35 AM  Fall Risk   Falls in the past year? 1 0 1 0 0  Number falls in past yr: 0 0 1 0 0  Injury with Fall? 0 0 1 0 0  Risk for fall due to : History of fall(s);Impaired balance/gait;Impaired mobility History of fall(s);Impaired balance/gait;Impaired mobility Impaired balance/gait;Impaired  mobility;Impaired vision No Fall Risks Impaired balance/gait;Impaired mobility  Follow up  Falls evaluation completed Falls evaluation completed Falls evaluation completed     FALL RISK PREVENTION PERTAINING TO THE HOME:  Any stairs in or around the home? Yes  If so, are there any without handrails? No  Home free of loose throw rugs in walkways, pet beds, electrical cords, etc? No  Adequate lighting in your home to reduce risk of falls? Yes   ASSISTIVE DEVICES UTILIZED TO PREVENT FALLS:  Life alert? No  Use of a cane, walker or w/c? Yes  Grab bars in the bathroom? Yes  Shower chair or bench in shower? Yes  Elevated toilet seat or a handicapped toilet? Yes   TIMED UP AND GO:  Was the test performed? No .  Nonambulatory   Cognitive Function:    11/06/2022   11:01 AM 11/02/2021    2:34 PM  MMSE - Mini Mental State Exam  Not completed:  Unable to complete  Orientation to time 2   Orientation to Place 3   Registration 3   Attention/ Calculation 3   Recall 2   Language- name 2 objects 2   Language- repeat 1   Language- follow 3 step command 2   Language- read & follow direction 1   Write a sentence 0   Copy design 0   Total score 19         11/06/2022   11:01 AM 05/13/2020    9:57 AM  6CIT Screen  What Year? 0 points 0 points  What month? 0 points 0 points  What time? 0 points 0  points  Count back from 20 4 points 0 points  Months in reverse 4 points 4 points  Repeat phrase 4 points 2 points  Total Score 12 points 6 points    Immunizations Immunization History  Administered Date(s) Administered   Fluad Quad(high Dose 65+) 03/05/2019, 03/23/2021   Influenza Split 03/02/2011, 03/14/2012   Influenza Whole 03/21/2007, 02/24/2008, 02/18/2009, 01/28/2010   Influenza, High Dose Seasonal PF 04/03/2013, 03/10/2014, 03/30/2015, 02/29/2016, 02/06/2017, 02/27/2018   Influenza-Unspecified 03/15/2022   Moderna Sars-Covid-2 Vaccination 03/15/2022   PFIZER(Purple  Top)SARS-COV-2 Vaccination 07/04/2019, 07/27/2019   Pfizer Covid-19 Vaccine Bivalent Booster 44yrs & up 10/11/2021   Pneumococcal Conjugate-13 04/07/2014   Pneumococcal Polysaccharide-23 10/18/2010   Rsv, Bivalent, Protein Subunit Rsvpref,pf Verdis Frederickson) 04/24/2022   Tdap 10/18/2010, 11/11/2021   Zoster Recombinat (Shingrix) 09/16/2021, 12/14/2021    TDAP status: Up to date  Flu Vaccine status: Up to date  Pneumococcal vaccine status: Up to date  Covid-19 vaccine status: Completed vaccines  Qualifies for Shingles Vaccine? Yes   Zostavax completed Yes   Shingrix Completed?: No.    Education has been provided regarding the importance of this vaccine. Patient has been advised to call insurance company to determine out of pocket expense if they have not yet received this vaccine. Advised may also receive vaccine at local pharmacy or Health Dept. Verbalized acceptance and understanding.  Screening Tests Health Maintenance  Topic Date Due   COVID-19 Vaccine (5 - 2023-24 season) 05/10/2022   INFLUENZA VACCINE  12/21/2022   Medicare Annual Wellness (AWV)  11/06/2023   DTaP/Tdap/Td (3 - Td or Tdap) 11/12/2031   Pneumonia Vaccine 35+ Years old  Completed   DEXA SCAN  Completed   Zoster Vaccines- Shingrix  Completed   HPV VACCINES  Aged Out   MAMMOGRAM  Discontinued    Health Maintenance  Health Maintenance Due  Topic Date Due   COVID-19 Vaccine (5 - 2023-24 season) 05/10/2022    Colorectal cancer screening: No longer required.   Mammogram status: No longer required due to age.  Bone Density status: Completed 11-06-21. Results reflect: Bone density results: OSTEOPOROSIS. Repeat every 2 years.  Lung Cancer Screening: (Low Dose CT Chest recommended if Age 24-80 years, 30 pack-year currently smoking OR have quit w/in 15years.) does not qualify.   Lung Cancer Screening Referral: no  Additional Screening:  Hepatitis C Screening: does not qualify; Completed   Vision Screening:  Recommended annual ophthalmology exams for early detection of glaucoma and other disorders of the eye. Is the patient up to date with their annual eye exam?  No  Who is the provider or what is the name of the office in which the patient attends annual eye exams?  If pt is not established with a provider, would they like to be referred to a provider to establish care? No .   Dental Screening: Recommended annual dental exams for proper oral hygiene  Community Resource Referral / Chronic Care Management: CRR required this visit?  No   CCM required this visit?  No      Plan:     I have personally reviewed and noted the following in the patient's chart:   Medical and social history Use of alcohol, tobacco or illicit drugs  Current medications and supplements including opioid prescriptions. Patient is not currently taking opioid prescriptions. Functional ability and status Nutritional status Physical activity Advanced directives List of other physicians Hospitalizations, surgeries, and ER visits in previous 12 months Vitals Screenings to include cognitive, depression, and falls Referrals and  appointments  In addition, I have reviewed and discussed with patient certain preventive protocols, quality metrics, and best practice recommendations. A written personalized care plan for preventive services as well as general preventive health recommendations were provided to patient.     Sharee Holster, NP   11/06/2022   Nurse Notes: this exam was completed by myself at this facility

## 2022-11-20 ENCOUNTER — Encounter: Payer: Self-pay | Admitting: Adult Health

## 2022-11-20 ENCOUNTER — Non-Acute Institutional Stay (SKILLED_NURSING_FACILITY): Payer: Medicare Other | Admitting: Adult Health

## 2022-11-20 DIAGNOSIS — E782 Mixed hyperlipidemia: Secondary | ICD-10-CM

## 2022-11-20 DIAGNOSIS — D631 Anemia in chronic kidney disease: Secondary | ICD-10-CM | POA: Diagnosis not present

## 2022-11-20 DIAGNOSIS — N1832 Chronic kidney disease, stage 3b: Secondary | ICD-10-CM | POA: Diagnosis not present

## 2022-11-20 NOTE — Progress Notes (Unsigned)
Location:  [facility] Room Number: 121 Place of Service:  SNF (31)   CODE STATUS: dnr  Allergies  Allergen Reactions   Septra Ds [Sulfamethoxazole-Trimethoprim]    Lisinopril Other (See Comments)    Cough    Chief Complaint  Patient presents with   Medical Management of Chronic Issues          Chronic kidney disease stage 3b: Anemia due to chronic kidney disease: Mixed hyperlipidemia     HPI:  She is a 89 year old long term resident of this facility being seen for the management of her chronic illnesses:  Chronic kidney disease stage 3b: Anemia due to chronic kidney disease: Mixed hyperlipidemia. There are no reports of uncontrolled pain. Her weight is stable. There are no reports of anxiety or depressive thoughts.   Past Medical History:  Diagnosis Date   Acute metabolic encephalopathy 07/2021   Atrial fibrillation (HCC)    Bleeding stomach ulcer 1980s?   CVA (cerebral vascular accident) (HCC) 07/2021   DIVERTICULOSIS, COLON 11/22/2006   Headache(784.0)    "very often; not regular" (11/25/2015)   Heart block    hx of   Hip fracture (HCC) 07/2021   History of blood transfusion 1980s   "when I had the bleeding ulcer"   HYPERLIPIDEMIA 11/22/2006   Hypertension    Melanoma (HCC)    LLE   Menopausal syndrome (hot flashes)    OSTEOPOROSIS 11/22/2006   Presence of permanent cardiac pacemaker    Vitamin D deficiency 08/21/2021    Past Surgical History:  Procedure Laterality Date   APPENDECTOMY     BUNIONECTOMY Right    CATARACT EXTRACTION W/ INTRAOCULAR LENS  IMPLANT, BILATERAL Bilateral 2000s   CHOLECYSTECTOMY OPEN     EP IMPLANTABLE DEVICE N/A 11/25/2015   Procedure: Pacemaker Implant;  Surgeon: Will Jorja Loa, MD;  [facility]: [facility];  Service: Cardiovascular;  Laterality: N/A;   FEMUR IM NAIL Left 07/08/2014   Procedure: INTRAMEDULLARY (IM) NAIL ;  Surgeon: Cammy Copa, MD;  Location: [facility] ORS;  Service: Orthopedics;   Laterality: Left;   FRACTURE SURGERY     HIP ARTHROPLASTY Right 08/20/2021   Procedure: ARTHROPLASTY BIPOLAR HIP (HEMIARTHROPLASTY);  Surgeon: Myrene Galas, MD;  Location: [facility] OR;  Service: Orthopedics;  Laterality: Right;   IR KYPHO LUMBAR INC FX REDUCE BONE BX UNI/BIL CANNULATION INC/IMAGING  03/21/2021   PACEMAKER INSERTION     REVISION TOTAL HIP ARTHROPLASTY Left 06/2014    Social History   Socioeconomic History   Marital status: Widowed    Spouse name: Not on file   Number of children: 2   Years of education: 65   Highest education level: Not on file  Occupational History    Comment: retired  Tobacco Use   Smoking status: Never    Passive exposure: Never   Smokeless tobacco: Never  Vaping Use   Vaping Use: Never used  Substance and Sexual Activity   Alcohol use: No    Alcohol/week: 0.0 standard drinks of alcohol   Drug use: No   Sexual activity: Never  Other Topics Concern   Not on file  Social History Narrative   09/21/21 resides at [facility]      Lives alone, son lives beside her   Caffeine - none   Social Determinants of Health   Financial Resource Strain: Low Risk  (05/13/2020)   Overall Financial Resource Strain (CARDIA)    Difficulty of Paying Living Expenses: Not hard  at all  Food Insecurity: No Food Insecurity (05/13/2020)   Hunger Vital Sign    Worried About Running Out of Food in the Last Year: Never true    Ran Out of Food in the Last Year: Never true  Transportation Needs: No Transportation Needs (05/13/2020)   PRAPARE - Administrator, Civil Service (Medical): No    Lack of Transportation (Non-Medical): No  Physical Activity: Inactive (05/13/2020)   Exercise Vital Sign    Days of Exercise per Week: 0 days    Minutes of Exercise per Session: 0 min  Stress: No Stress Concern Present (05/13/2020)   [employer] of Occupational Health - Occupational Stress Questionnaire    Feeling of Stress : Not at all  Social Connections:  Socially Isolated (05/13/2020)   Social Connection and Isolation Panel [NHANES]    Frequency of Communication with Friends and Family: More than three times a week    Frequency of Social Gatherings with Friends and Family: More than three times a week    Attends Religious Services: Never    Database administrator or Organizations: No    Attends Banker Meetings: Never    Marital Status: Widowed  Intimate Partner Violence: Not At Risk (05/13/2020)   Humiliation, Afraid, Rape, and Kick questionnaire    Fear of Current or Ex-Partner: No    Emotionally Abused: No    Physically Abused: No    Sexually Abused: No   Family History  Problem Relation Age of Onset   Alzheimer's disease Sister    Cardiomyopathy Brother       VITAL SIGNS BP (!) 142/76   Pulse 62   Temp 98.1 F (36.7 C)   Resp 20   Ht 5\' 5"  (1.651 m)   Wt 114 lb (51.7 kg)   SpO2 91%   BMI 18.97 kg/m   Outpatient Encounter Medications as of 11/20/2022  Medication Sig   Acetaminophen (TYLENOL ARTHRITIS PAIN PO) Take 650 mg by mouth 3 (three) times daily.   amiodarone (PACERONE) 200 MG tablet Take 1 tablet (200 mg total) by mouth daily.   aspirin 81 MG chewable tablet Chew 1 tablet (81 mg total) by mouth daily.   Camphor-Menthol-Methyl Sal (SALONPAS) 3.05-27-08 % PTCH Apply 1 patch topically daily. At 9 am   feeding supplement (BOOST HIGH PROTEIN) LIQD Take 1 Container by mouth in the morning and at bedtime. 0.06 gram- 1 kcal/mL; amt:   methocarbamol (ROBAXIN) 500 MG tablet Take 500 mg by mouth every 6 (six) hours as needed for muscle spasms.   NON FORMULARY Diet - Dysphagia 3   ondansetron (ZOFRAN-ODT) 4 MG disintegrating tablet Take 4 mg by mouth every 8 (eight) hours as needed for nausea or vomiting.   senna-docusate (SENNA S) 8.6-50 MG tablet Take 1 tablet by mouth 2 (two) times daily.   No facility-administered encounter medications on file as of 11/20/2022.     SIGNIFICANT DIAGNOSTIC  EXAMS   PREVIOUS  01-04-22: dexa scan: t score -4.184   NO NEW EXAMS   LABS REVIEWED; PREVIOUS   01-16-22; glucose 62; bun 19; creat 0.91; k+ 4.6; na++ 139; ca 8.7; gfr 60; vitamin D 29.35  04-20-22: wbc 5.9; hgb 12.4; hct 39.8; mcv 97.5 plt 204; glucose 86; bun 14; creat 0.83; k+ 4.0; na++ 140; ca 8.4; gfr >60; protein 6.3; albumin 3.4; tsh 3.839; iron 58; tibc 304; vitamin D 45.06     TODAY  10-09-22: wbc 5.8; hgb 11.1; hct 35.4;  mcv 100; plt 257; glucose 80; bun 21; creat 0.95; k+ 4.0; na++ 138; ca 8.5; gfr 57; protein 5.8; protein 3.1    Review of Systems  Constitutional:  Negative for malaise/fatigue.  Respiratory:  Negative for cough and shortness of breath.   Cardiovascular:  Negative for chest pain, palpitations and leg swelling.  Gastrointestinal:  Negative for abdominal pain, constipation and heartburn.  Musculoskeletal:  Negative for back pain, joint pain and myalgias.  Skin: Negative.   Neurological:  Negative for dizziness.  Psychiatric/Behavioral:  The patient is not nervous/anxious.    Physical Exam Constitutional:      General: She is not in acute distress.    Appearance: She is well-developed. She is not diaphoretic.  Neck:     Thyroid: No thyromegaly.  Cardiovascular:     Rate and Rhythm: Normal rate and regular rhythm.     Pulses: Normal pulses.     Heart sounds: Murmur heard.  Pulmonary:     Effort: Pulmonary effort is normal. No respiratory distress.     Breath sounds: Normal breath sounds.  Abdominal:     General: Bowel sounds are normal. There is no distension.     Palpations: Abdomen is soft.     Tenderness: There is no abdominal tenderness.  Musculoskeletal:        General: Normal range of motion.     Cervical back: Neck supple.     Right lower leg: Edema present.     Left lower leg: Edema present.  Lymphadenopathy:     Cervical: No cervical adenopathy.  Skin:    General: Skin is warm and dry.  Neurological:     Mental Status: She is alert.  Mental status is at baseline.  Psychiatric:        Mood and Affect: Mood normal.         ASSESSMENT/ PLAN:  TODAY  Chronic kidney disease stage 3b: bun 21; creat 0.95; gfr 57  2. Anemia due to chronic kidney disease: hgb 11.1  3. Mixed hyperlipidemia: is off statin due to advanced age.    PREVIOUS   4. Vitamin D deficiency: level 45.06  5. Vitamin B 12 deficiency: level 204  6. Aortic atherosclerosis (ct 08-02-21) is on asa off statin due to advanced age   61. Post menopausal osteoporosis t score -4.184 will monitor  8. Protein calorie malnutrition: albumin 3.1 will continue supplements  9. History of CVA (cerebrovascular accident) is on asa 81 mg daily   10. Atrial fibrillation/sick sinus syndrome: s/p pacemaker will continue amiodarone 200 mg daily; asa 81 mg daily   11. Primary hypertension: b/p 142/76  12. Gastroesophageal reflux disease without esophagitis; will monitor      Synthia Innocent NP [facility]  call [phone]

## 2022-12-07 ENCOUNTER — Non-Acute Institutional Stay (SKILLED_NURSING_FACILITY): Payer: Medicare Other | Admitting: Adult Health

## 2022-12-07 ENCOUNTER — Encounter: Payer: Self-pay | Admitting: Adult Health

## 2022-12-07 DIAGNOSIS — I482 Chronic atrial fibrillation, unspecified: Secondary | ICD-10-CM

## 2022-12-07 NOTE — Progress Notes (Signed)
Location:  [facility] Room Number: 121 Place of Service:  SNF (31)   CODE STATUS: dnr   Allergies  Allergen Reactions   Septra Ds [Sulfamethoxazole-Trimethoprim]    Lisinopril Other (See Comments)    Cough    Chief Complaint  Patient presents with   Acute Visit    Bradycardia      HPI:  She is taking amiodarone 200 mg daily her pulse rate is in the 50-60's. She has a history of atrial fibrillation. She denies any palpitations. No chest pain.   Past Medical History:  Diagnosis Date   Acute metabolic encephalopathy 07/2021   Atrial fibrillation (HCC)    Bleeding stomach ulcer 1980s?   CVA (cerebral vascular accident) (HCC) 07/2021   DIVERTICULOSIS, COLON 11/22/2006   Headache(784.0)    "very often; not regular" (11/25/2015)   Heart block    hx of   Hip fracture (HCC) 07/2021   History of blood transfusion 1980s   "when I had the bleeding ulcer"   HYPERLIPIDEMIA 11/22/2006   Hypertension    Melanoma (HCC)    LLE   Menopausal syndrome (hot flashes)    OSTEOPOROSIS 11/22/2006   Presence of permanent cardiac pacemaker    Vitamin D deficiency 08/21/2021    Past Surgical History:  Procedure Laterality Date   APPENDECTOMY     BUNIONECTOMY Right    CATARACT EXTRACTION W/ INTRAOCULAR LENS  IMPLANT, BILATERAL Bilateral 2000s   CHOLECYSTECTOMY OPEN     EP IMPLANTABLE DEVICE N/A 11/25/2015   Procedure: Pacemaker Implant;  Surgeon: Will Jorja Loa, MD;  Location: [facility];  Service: Cardiovascular;  Laterality: N/A;   FEMUR IM NAIL Left 07/08/2014   Procedure: INTRAMEDULLARY (IM) NAIL ;  Surgeon: Cammy Copa, MD;  Location: [facility] ORS;  Service: Orthopedics;  Laterality: Left;   FRACTURE SURGERY     HIP ARTHROPLASTY Right 08/20/2021   Procedure: ARTHROPLASTY BIPOLAR HIP (HEMIARTHROPLASTY);  Surgeon: Myrene Galas, MD;  Location: [facility] OR;  Service: Orthopedics;  Laterality: Right;   IR KYPHO LUMBAR INC FX REDUCE BONE BX UNI/BIL  CANNULATION INC/IMAGING  03/21/2021   PACEMAKER INSERTION     REVISION TOTAL HIP ARTHROPLASTY Left 06/2014    Social History   Socioeconomic History   Marital status: Widowed    Spouse name: Not on file   Number of children: 2   Years of education: 75   Highest education level: Not on file  Occupational History    Comment: retired  Tobacco Use   Smoking status: Never    Passive exposure: Never   Smokeless tobacco: Never  Vaping Use   Vaping status: Never Used  Substance and Sexual Activity   Alcohol use: No    Alcohol/week: 0.0 standard drinks of alcohol   Drug use: No   Sexual activity: Never  Other Topics Concern   Not on file  Social History Narrative   09/21/21 resides at [facility]      Lives alone, son lives beside her   Caffeine - none   Social Determinants of Health   Financial Resource Strain: Low Risk  (05/13/2020)   Overall Financial Resource Strain (CARDIA)    Difficulty of Paying Living Expenses: Not hard at all  Food Insecurity: No Food Insecurity (05/13/2020)   Hunger Vital Sign    Worried About Running Out of Food in the Last Year: Never true    Ran Out of Food in the Last Year: Never true  Transportation Needs: No  Transportation Needs (05/13/2020)   PRAPARE - Administrator, Civil Service (Medical): No    Lack of Transportation (Non-Medical): No  Physical Activity: Inactive (05/13/2020)   Exercise Vital Sign    Days of Exercise per Week: 0 days    Minutes of Exercise per Session: 0 min  Stress: No Stress Concern Present (05/13/2020)   [employer] of Occupational Health - Occupational Stress Questionnaire    Feeling of Stress : Not at all  Social Connections: Socially Isolated (05/13/2020)   Social Connection and Isolation Panel [NHANES]    Frequency of Communication with Friends and Family: More than three times a week    Frequency of Social Gatherings with Friends and Family: More than three times a week    Attends Religious  Services: Never    Database administrator or Organizations: No    Attends Banker Meetings: Never    Marital Status: Widowed  Intimate Partner Violence: Not At Risk (05/13/2020)   Humiliation, Afraid, Rape, and Kick questionnaire    Fear of Current or Ex-Partner: No    Emotionally Abused: No    Physically Abused: No    Sexually Abused: No   Family History  Problem Relation Age of Onset   Alzheimer's disease Sister    Cardiomyopathy Brother       VITAL SIGNS BP (!) 140/73   Pulse (!) 59   Temp 97.6 F (36.4 C)   Resp 16   Ht 5\' 5"  (1.651 m)   Wt 114 lb 6.4 oz (51.9 kg)   SpO2 100%   BMI 19.04 kg/m   Outpatient Encounter Medications as of 12/07/2022  Medication Sig   Acetaminophen (TYLENOL ARTHRITIS PAIN PO) Take 650 mg by mouth 3 (three) times daily.   amiodarone (PACERONE) 200 MG tablet Take 1 tablet (200 mg total) by mouth daily.   aspirin 81 MG chewable tablet Chew 1 tablet (81 mg total) by mouth daily.   Camphor-Menthol-Methyl Sal (SALONPAS) 3.05-27-08 % PTCH Apply 1 patch topically daily. At 9 am   feeding supplement (BOOST HIGH PROTEIN) LIQD Take 1 Container by mouth in the morning and at bedtime. 0.06 gram- 1 kcal/mL; amt:   NON FORMULARY Diet - Dysphagia 3   ondansetron (ZOFRAN-ODT) 4 MG disintegrating tablet Take 4 mg by mouth every 8 (eight) hours as needed for nausea or vomiting.   senna-docusate (SENNA S) 8.6-50 MG tablet Take 1 tablet by mouth 2 (two) times daily.   No facility-administered encounter medications on file as of 12/07/2022.     SIGNIFICANT DIAGNOSTIC EXAMS  PREVIOUS   01-04-22: dexa scan: t score -4.184   NO NEW EXAMS   LABS REVIEWED; PREVIOUS   01-16-22; glucose 62; bun 19; creat 0.91; k+ 4.6; na++ 139; ca 8.7; gfr 60; vitamin D 29.35  04-20-22: wbc 5.9; hgb 12.4; hct 39.8; mcv 97.5 plt 204; glucose 86; bun 14; creat 0.83; k+ 4.0; na++ 140; ca 8.4; gfr >60; protein 6.3; albumin 3.4; tsh 3.839; iron 58; tibc 304; vitamin  D 45.06    10-09-22: wbc 5.8; hgb 11.1; hct 35.4; mcv 100; plt 257; glucose 80; bun 21; creat 0.95; k+ 4.0; na++ 138; ca 8.5; gfr 57; protein 5.8; protein 3.1  NO NEW LABS.     Review of Systems  Constitutional:  Negative for malaise/fatigue.  Respiratory:  Negative for cough and shortness of breath.   Cardiovascular:  Negative for chest pain, palpitations and leg swelling.  Gastrointestinal:  Negative for abdominal  pain, constipation and heartburn.  Musculoskeletal:  Negative for back pain, joint pain and myalgias.  Skin: Negative.   Neurological:  Negative for dizziness.  Psychiatric/Behavioral:  The patient is not nervous/anxious.     Physical Exam Constitutional:      General: She is not in acute distress.    Appearance: She is well-developed. She is not diaphoretic.  Neck:     Thyroid: No thyromegaly.  Cardiovascular:     Rate and Rhythm: Regular rhythm. Bradycardia present.     Pulses: Normal pulses.     Heart sounds: Murmur heard.  Pulmonary:     Effort: Pulmonary effort is normal. No respiratory distress.     Breath sounds: Normal breath sounds.  Abdominal:     General: Bowel sounds are normal. There is no distension.     Palpations: Abdomen is soft.     Tenderness: There is no abdominal tenderness.  Musculoskeletal:        General: Normal range of motion.     Cervical back: Neck supple.     Right lower leg: Edema present.     Left lower leg: Edema present.  Lymphadenopathy:     Cervical: No cervical adenopathy.  Skin:    General: Skin is warm and dry.  Neurological:     Mental Status: She is alert. Mental status is at baseline.  Psychiatric:        Mood and Affect: Mood normal.       ASSESSMENT/ PLAN:  TODAY  Atrial fibrillation, chronic: she is bradycardic will lower amiodarone to 100 mg daily and will monitor her status.    Synthia Innocent NP [facility]   call [phone]

## 2023-01-09 ENCOUNTER — Non-Acute Institutional Stay (SKILLED_NURSING_FACILITY): Payer: Medicare Other | Admitting: Internal Medicine

## 2023-01-09 ENCOUNTER — Encounter: Payer: Self-pay | Admitting: Internal Medicine

## 2023-01-09 DIAGNOSIS — J9811 Atelectasis: Secondary | ICD-10-CM | POA: Diagnosis not present

## 2023-01-09 DIAGNOSIS — R29818 Other symptoms and signs involving the nervous system: Secondary | ICD-10-CM

## 2023-01-09 DIAGNOSIS — M546 Pain in thoracic spine: Secondary | ICD-10-CM

## 2023-01-09 DIAGNOSIS — I700 Atherosclerosis of aorta: Secondary | ICD-10-CM | POA: Diagnosis not present

## 2023-01-09 DIAGNOSIS — G8929 Other chronic pain: Secondary | ICD-10-CM

## 2023-01-09 DIAGNOSIS — E43 Unspecified severe protein-calorie malnutrition: Secondary | ICD-10-CM | POA: Diagnosis not present

## 2023-01-09 DIAGNOSIS — R4189 Other symptoms and signs involving cognitive functions and awareness: Secondary | ICD-10-CM

## 2023-01-09 DIAGNOSIS — Z950 Presence of cardiac pacemaker: Secondary | ICD-10-CM | POA: Diagnosis not present

## 2023-01-09 NOTE — Assessment & Plan Note (Signed)
Protein/caloric malnutrition has progressed somewhat with current albumin of 3.1 and total protein of 5.8.  She has limb atrophy and interosseous wasting on exam.  Nutritionist to consult.

## 2023-01-09 NOTE — Progress Notes (Unsigned)
NURSING HOME LOCATION:  [facility] ROOM NUMBER:  [id number]  CODE STATUS:  DNR  PCP:  Synthia Innocent NP  This is a nursing facility follow up visit of chronic medical diagnoses & to document compliance with Regulation 483.30 (c) in The Long Term Care Survey Manual Phase 2 which mandates caregiver visit ( visits can alternate among physician, PA or NP as per statutes) within 10 days of 30 days / 60 days/ 90 days post admission to SNF date    Interim medical record and care since last SNF visit was updated with review of diagnostic studies and change in clinical status since last visit were documented.  HPI: She is a permanent resident of this facility with medical diagnoses of A-fib, history of gastric ulcer, history of stroke, history of diverticulosis, dyslipidemia, essential hypertension, history of melanoma, and history of heart block for which permanent cardiac pacemaker has been inserted. Most recent labs were performed 10/09/2022.  Hypocalcemia is essentially stable with a value of 8.5.  Protein/caloric malnutrition has progressed somewhat as albumin was 3.1 and total protein 5.8.  CKD stage IIIa was present with a creatinine of 0.95 and GFR of 57 down from prior GFR greater than 60.  Mild anemia was present with H/H of 11.1/35.4.  MCV was high normal at 100.0.  TSH was therapeutic at 3.81.  Review of systems: Dementia invalidated responses. Date given as January 13, 2019 "something."  Her only complaint is back pain which she localizes to the right infrascapular area of the posterior thorax.  She does have a history of lumbar compression fracture sustained in a mechanical fall in 2023.  She cannot tell me how long she has had the chest pain and she cannot tell me how long she has been here at this facility.  She is wearing 2 Salonpas patches 1 in this area one of the lumbosacral spine.  She describes the pain as throbbing and constant and nonradiating.  There are no definite  triggers and there is been no specific injury.  It is not affected by deep breathing or position change.  She denies any associated radicular character.  She has no stool or urinary incontinence.  She does state that Tylenol helps "after a while."  Constitutional: No fever, significant weight change, fatigue  Eyes: No redness, discharge, pain, vision change ENT/mouth: No nasal congestion,  purulent discharge, earache, change in hearing, sore throat  Cardiovascular: No chest pain, palpitations, paroxysmal nocturnal dyspnea, claudication, edema  Respiratory: No cough, sputum production, hemoptysis, DOE, significant snoring, apnea   Gastrointestinal: No heartburn, dysphagia, abdominal pain, nausea /vomiting, rectal bleeding, melena, change in bowels Genitourinary: No dysuria, hematuria, pyuria, incontinence, nocturia Musculoskeletal: No joint stiffness, joint swelling, weakness, pain Dermatologic: No rash, pruritus, change in appearance of skin Neurologic: No dizziness, headache, syncope, seizures, numbness, tingling Psychiatric: No significant anxiety, depression, insomnia, anorexia Endocrine: No change in hair/skin/nails, excessive thirst, excessive hunger, excessive urination  Hematologic/lymphatic: No significant bruising, lymphadenopathy, abnormal bleeding Allergy/immunology: No itchy/watery eyes, significant sneezing, urticaria, angioedema  Physical exam:  Pertinent or positive findings: She initially seemed to be asleep seeing in the wheelchair.  She was not exhibiting hypopnea, snoring, or frank apnea.  She roused easily and immediately be began complaining of the "back pain."  She appears her age and suboptimally nourished.  She has sticky rales mainly at the right base which clear with deep inspiration.  There is no exacerbation of pain to light percussion or chest compression.  No rubs  are auscultated.  Pedal pulses are not palpable.  Limbs are atrophic and she has marked interosseous  wasting.  General appearance: Adequately nourished; no acute distress, increased work of breathing is present.   Lymphatic: No lymphadenopathy about the head, neck, axilla. Eyes: No conjunctival inflammation or lid edema is present. There is no scleral icterus. Ears:  External ear exam shows no significant lesions or deformities.   Nose:  External nasal examination shows no deformity or inflammation. Nasal mucosa are pink and moist without lesions, exudates Oral exam:  Lips and gums are healthy appearing. There is no oropharyngeal erythema or exudate. Neck:  No thyromegaly, masses, tenderness noted.    Heart:  Normal rate and regular rhythm. S1 and S2 normal without gallop, murmur, click, rub .  Lungs: Chest clear to auscultation without wheezes, rhonchi, rales, rubs. Abdomen: Bowel sounds are normal. Abdomen is soft and nontender with no organomegaly, hernias, masses. GU: Deferred  Extremities:  No cyanosis, clubbing, edema  Neurologic exam : Cn 2-7 intact Strength equal  in upper & lower extremities Balance, Rhomberg, finger to nose testing could not be completed due to clinical state Deep tendon reflexes are equal Skin: Warm & dry w/o tenting. No significant lesions or rash.  See summary under each active problem in the Problem List with associated updated therapeutic plan

## 2023-01-09 NOTE — Assessment & Plan Note (Signed)
She gave the date is January 13, 2019 "something."  She cannot tell me how long she has been here at the facility.

## 2023-01-09 NOTE — Patient Instructions (Signed)
See assessment and plan under each diagnosis in the problem list and acutely for this visit 

## 2023-01-10 ENCOUNTER — Encounter: Payer: Self-pay | Admitting: Internal Medicine

## 2023-01-10 DIAGNOSIS — M546 Pain in thoracic spine: Secondary | ICD-10-CM | POA: Insufficient documentation

## 2023-01-10 NOTE — Assessment & Plan Note (Addendum)
Calcium in aortic knob on PCXR.Chronic thoracic pain is non anginal. Check D - dimer although PTE unlikely.

## 2023-01-11 ENCOUNTER — Other Ambulatory Visit (HOSPITAL_COMMUNITY)
Admission: RE | Admit: 2023-01-11 | Discharge: 2023-01-11 | Disposition: A | Discharge status: Home / Self Care | Payer: Medicare Other | Source: Skilled Nursing Facility | Attending: Adult Health | Admitting: Adult Health

## 2023-01-11 DIAGNOSIS — I482 Chronic atrial fibrillation, unspecified: Secondary | ICD-10-CM | POA: Diagnosis not present

## 2023-01-11 DIAGNOSIS — Z950 Presence of cardiac pacemaker: Secondary | ICD-10-CM | POA: Diagnosis not present

## 2023-01-11 LAB — D-DIMER, QUANTITATIVE: D-Dimer, Quant: 0.4 ug{FEU}/mL (ref 0.00–0.50)

## 2023-02-08 ENCOUNTER — Encounter: Payer: Self-pay | Admitting: Adult Health

## 2023-02-08 ENCOUNTER — Non-Acute Institutional Stay (SKILLED_NURSING_FACILITY): Payer: Medicare Other | Admitting: Adult Health

## 2023-02-08 DIAGNOSIS — I700 Atherosclerosis of aorta: Secondary | ICD-10-CM | POA: Diagnosis not present

## 2023-02-08 DIAGNOSIS — E559 Vitamin D deficiency, unspecified: Secondary | ICD-10-CM

## 2023-02-08 DIAGNOSIS — E538 Deficiency of other specified B group vitamins: Secondary | ICD-10-CM

## 2023-02-08 NOTE — Progress Notes (Signed)
Location:  [facility] Room Number: 11 D Place of Service:  SNF (31)   CODE STATUS: DNR  Allergies  Allergen Reactions   Septra Ds [Sulfamethoxazole-Trimethoprim]    Lisinopril Other (See Comments)    Cough    Chief Complaint  Patient presents with   Medical Management of Chronic Issues             Vitamin D deficiency:    Vitamin B12:    Aortic atherosclerosis      HPI:  She is a 89 year old long term resident of this facility being seen for the management of her chronic illnesses:  Vitamin D deficiency:    Vitamin B12:    Aortic atherosclerosis. There are no reports of uncontrolled pain present. Her weight is stable. She denies any anxiety or depressive thoughts.   Past Medical History:  Diagnosis Date   Acute metabolic encephalopathy 07/2021   Atrial fibrillation (HCC)    Bleeding stomach ulcer 1980s?   CVA (cerebral vascular accident) (HCC) 07/2021   DIVERTICULOSIS, COLON 11/22/2006   Headache(784.0)    "very often; not regular" (11/25/2015)   Heart block    hx of   Hip fracture (HCC) 07/2021   History of blood transfusion 1980s   "when I had the bleeding ulcer"   HYPERLIPIDEMIA 11/22/2006   Hypertension    Melanoma (HCC)    LLE   Menopausal syndrome (hot flashes)    OSTEOPOROSIS 11/22/2006   Presence of permanent cardiac pacemaker    Vitamin D deficiency 08/21/2021    Past Surgical History:  Procedure Laterality Date   APPENDECTOMY     BUNIONECTOMY Right    CATARACT EXTRACTION W/ INTRAOCULAR LENS  IMPLANT, BILATERAL Bilateral 2000s   CHOLECYSTECTOMY OPEN     EP IMPLANTABLE DEVICE N/A 11/25/2015   Procedure: Pacemaker Implant;  Surgeon: Will Jorja Loa, MD;  Location: [facility];  Service: Cardiovascular;  Laterality: N/A;   FEMUR IM NAIL Left 07/08/2014   Procedure: INTRAMEDULLARY (IM) NAIL ;  Surgeon: Cammy Copa, MD;  Location: [facility] ORS;  Service: Orthopedics;  Laterality: Left;   FRACTURE SURGERY     HIP  ARTHROPLASTY Right 08/20/2021   Procedure: ARTHROPLASTY BIPOLAR HIP (HEMIARTHROPLASTY);  Surgeon: Myrene Galas, MD;  Location: [facility] OR;  Service: Orthopedics;  Laterality: Right;   IR KYPHO LUMBAR INC FX REDUCE BONE BX UNI/BIL CANNULATION INC/IMAGING  03/21/2021   PACEMAKER INSERTION     REVISION TOTAL HIP ARTHROPLASTY Left 06/2014    Social History   Socioeconomic History   Marital status: Widowed    Spouse name: Not on file   Number of children: 2   Years of education: 22   Highest education level: Not on file  Occupational History    Comment: retired  Tobacco Use   Smoking status: Never    Passive exposure: Never   Smokeless tobacco: Never  Vaping Use   Vaping status: Never Used  Substance and Sexual Activity   Alcohol use: No    Alcohol/week: 0.0 standard drinks of alcohol   Drug use: No   Sexual activity: Never  Other Topics Concern   Not on file  Social History Narrative   09/21/21 resides at [facility]      Lives alone, son lives beside her   Caffeine - none   Social Determinants of Health   Financial Resource Strain: Low Risk  (05/13/2020)   Overall Financial Resource Strain (CARDIA)    Difficulty of Paying Living  Expenses: Not hard at all  Food Insecurity: No Food Insecurity (05/13/2020)   Hunger Vital Sign    Worried About Running Out of Food in the Last Year: Never true    Ran Out of Food in the Last Year: Never true  Transportation Needs: No Transportation Needs (05/13/2020)   PRAPARE - Administrator, Civil Service (Medical): No    Lack of Transportation (Non-Medical): No  Physical Activity: Inactive (05/13/2020)   Exercise Vital Sign    Days of Exercise per Week: 0 days    Minutes of Exercise per Session: 0 min  Stress: No Stress Concern Present (05/13/2020)   [employer] of Occupational Health - Occupational Stress Questionnaire    Feeling of Stress : Not at all  Social Connections: Socially Isolated (05/13/2020)   Social Connection  and Isolation Panel [NHANES]    Frequency of Communication with Friends and Family: More than three times a week    Frequency of Social Gatherings with Friends and Family: More than three times a week    Attends Religious Services: Never    Database administrator or Organizations: No    Attends Banker Meetings: Never    Marital Status: Widowed  Intimate Partner Violence: Not At Risk (05/13/2020)   Humiliation, Afraid, Rape, and Kick questionnaire    Fear of Current or Ex-Partner: No    Emotionally Abused: No    Physically Abused: No    Sexually Abused: No   Family History  Problem Relation Age of Onset   Alzheimer's disease Sister    Cardiomyopathy Brother       VITAL SIGNS BP (!) 120/96   Pulse 66   Temp 98.8 F (37.1 C)   Resp 20   Ht 5\' 5"  (1.651 m)   Wt 114 lb 6.4 oz (51.9 kg)   SpO2 97%   BMI 19.04 kg/m   Outpatient Encounter Medications as of 02/08/2023  Medication Sig   Acetaminophen (TYLENOL ARTHRITIS PAIN PO) Take 650 mg by mouth 3 (three) times daily.   amiodarone (PACERONE) 100 MG tablet Take 100 mg by mouth daily.   aspirin 81 MG chewable tablet Chew 1 tablet (81 mg total) by mouth daily.   Camphor-Menthol-Methyl Sal (SALONPAS) 3.05-27-08 % PTCH Apply 1 patch topically daily. At 9 am   DULoxetine (CYMBALTA) 30 MG capsule Take 30 mg by mouth daily.   feeding supplement (BOOST HIGH PROTEIN) LIQD Take 1 Container by mouth in the morning and at bedtime. 0.06 gram- 1 kcal/mL; amt:   NON FORMULARY Diet - Dysphagia 3   ondansetron (ZOFRAN-ODT) 4 MG disintegrating tablet Take 4 mg by mouth every 8 (eight) hours as needed for nausea or vomiting.   senna-docusate (SENNA S) 8.6-50 MG tablet Take 1 tablet by mouth 2 (two) times daily.   No facility-administered encounter medications on file as of 02/08/2023.     SIGNIFICANT DIAGNOSTIC EXAMS  PREVIOUS  01-04-22: dexa scan: t score -4.184   NO NEW EXAMS   LABS REVIEWED; PREVIOUS   04-20-22: wbc  5.9; hgb 12.4; hct 39.8; mcv 97.5 plt 204; glucose 86; bun 14; creat 0.83; k+ 4.0; na++ 140; ca 8.4; gfr >60; protein 6.3; albumin 3.4; tsh 3.839; iron 58; tibc 304; vitamin D 45.06    10-09-22: wbc 5.8; hgb 11.1; hct 35.4; mcv 100; plt 257; glucose 80; bun 21; creat 0.95; k+ 4.0; na++ 138; ca 8.5; gfr 57; protein 5.8; protein 3.1   01-11-23: d-dimer: 0.40  NO  NEW LABS.    Review of Systems  Constitutional:  Negative for malaise/fatigue.  Respiratory:  Negative for cough and shortness of breath.   Cardiovascular:  Negative for chest pain, palpitations and leg swelling.  Gastrointestinal:  Negative for abdominal pain, constipation and heartburn.  Musculoskeletal:  Negative for back pain, joint pain and myalgias.  Skin: Negative.   Neurological:  Negative for dizziness.  Psychiatric/Behavioral:  The patient is not nervous/anxious.     Physical Exam Constitutional:      General: She is not in acute distress.    Appearance: She is well-developed. She is not diaphoretic.  Neck:     Thyroid: No thyromegaly.  Cardiovascular:     Rate and Rhythm: Normal rate and regular rhythm.     Pulses: Normal pulses.     Heart sounds: Normal heart sounds.  Pulmonary:     Effort: Pulmonary effort is normal. No respiratory distress.     Breath sounds: Normal breath sounds.  Abdominal:     General: Bowel sounds are normal. There is no distension.     Palpations: Abdomen is soft.     Tenderness: There is no abdominal tenderness.  Musculoskeletal:        General: Normal range of motion.     Cervical back: Neck supple.     Right lower leg: No edema.     Left lower leg: No edema.  Lymphadenopathy:     Cervical: No cervical adenopathy.  Skin:    General: Skin is warm and dry.  Neurological:     Mental Status: She is alert. Mental status is at baseline.  Psychiatric:        Mood and Affect: Mood normal.       ASSESSMENT/ PLAN:  TODAY  Vitamin D deficiency: level 45.06  2. Vitamin B12:  deficiency: level 204  3. Aortic atherosclerosis (ct 08-02-21) is on asa; off statin due to advanced age.    PREVIOUS   4. Post menopausal osteoporosis t score -4.184 will monitor  5. Protein calorie malnutrition: albumin 3.1 will continue supplements  6. History of CVA (cerebrovascular accident) is on asa 81 mg daily   7. Atrial fibrillation/sick sinus syndrome: s/p pacemaker will continue amiodarone 100 mg daily; asa 81 mg daily   8. Primary hypertension: b/p 120/96  9. Gastroesophageal reflux disease without esophagitis; will monitor   10. Chronic kidney disease stage 3b: bun 21; creat 0.95; gfr 57  11. Anemia due to chronic kidney disease: hgb 11.1  12. Mixed hyperlipidemia: is off statin due to advanced age.   13. Chronic low back pain: will continue cymbalta 30 mg daily    Synthia Innocent NP [facility]  call [phone]

## 2023-02-27 DIAGNOSIS — Z23 Encounter for immunization: Secondary | ICD-10-CM | POA: Diagnosis not present

## 2023-03-07 ENCOUNTER — Encounter: Payer: Self-pay | Admitting: Adult Health

## 2023-03-07 ENCOUNTER — Non-Acute Institutional Stay (SKILLED_NURSING_FACILITY): Payer: Medicare Other | Admitting: Adult Health

## 2023-03-07 DIAGNOSIS — Z8673 Personal history of transient ischemic attack (TIA), and cerebral infarction without residual deficits: Secondary | ICD-10-CM | POA: Diagnosis not present

## 2023-03-07 DIAGNOSIS — M810 Age-related osteoporosis without current pathological fracture: Secondary | ICD-10-CM | POA: Diagnosis not present

## 2023-03-07 DIAGNOSIS — E43 Unspecified severe protein-calorie malnutrition: Secondary | ICD-10-CM | POA: Diagnosis not present

## 2023-03-07 NOTE — Progress Notes (Unsigned)
Location:  [facility] Room Number: 121 Place of Service:  SNF (31)   CODE STATUS: dnr   Allergies  Allergen Reactions   Septra Ds [Sulfamethoxazole-Trimethoprim]    Lisinopril Other (See Comments)    Cough    Chief Complaint  Patient presents with   Medical Management of Chronic Issues         Post menopausal osteoporosis:      Protein calorie malnutrition:  History of CVA (cerebrovascular accident)     HPI:  She is a 89 year old long term resident of this facility being seen for the management of her chronic illnesses:  Post menopausal osteoporosis:      Protein calorie malnutrition:  History of CVA (cerebrovascular accident). There are no reports of uncontrolled pain. There are no reports of anxiety or depressive thoughts.   Past Medical History:  Diagnosis Date   Acute metabolic encephalopathy 07/2021   Atrial fibrillation (HCC)    Bleeding stomach ulcer 1980s?   CVA (cerebral vascular accident) (HCC) 07/2021   DIVERTICULOSIS, COLON 11/22/2006   Headache(784.0)    "very often; not regular" (11/25/2015)   Heart block    hx of   Hip fracture (HCC) 07/2021   History of blood transfusion 1980s   "when I had the bleeding ulcer"   HYPERLIPIDEMIA 11/22/2006   Hypertension    Melanoma (HCC)    LLE   Menopausal syndrome (hot flashes)    OSTEOPOROSIS 11/22/2006   Presence of permanent cardiac pacemaker    Vitamin D deficiency 08/21/2021    Past Surgical History:  Procedure Laterality Date   APPENDECTOMY     BUNIONECTOMY Right    CATARACT EXTRACTION W/ INTRAOCULAR LENS  IMPLANT, BILATERAL Bilateral 2000s   CHOLECYSTECTOMY OPEN     EP IMPLANTABLE DEVICE N/A 11/25/2015   Procedure: Pacemaker Implant;  Surgeon: Will Jorja Loa, MD;  Location: [facility];  Service: Cardiovascular;  Laterality: N/A;   FEMUR IM NAIL Left 07/08/2014   Procedure: INTRAMEDULLARY (IM) NAIL ;  Surgeon: Cammy Copa, MD;  Location: [facility] ORS;  Service:  Orthopedics;  Laterality: Left;   FRACTURE SURGERY     HIP ARTHROPLASTY Right 08/20/2021   Procedure: ARTHROPLASTY BIPOLAR HIP (HEMIARTHROPLASTY);  Surgeon: Myrene Galas, MD;  Location: [facility] OR;  Service: Orthopedics;  Laterality: Right;   IR KYPHO LUMBAR INC FX REDUCE BONE BX UNI/BIL CANNULATION INC/IMAGING  03/21/2021   PACEMAKER INSERTION     REVISION TOTAL HIP ARTHROPLASTY Left 06/2014    Social History   Socioeconomic History   Marital status: Widowed    Spouse name: Not on file   Number of children: 2   Years of education: 31   Highest education level: Not on file  Occupational History    Comment: retired  Tobacco Use   Smoking status: Never    Passive exposure: Never   Smokeless tobacco: Never  Vaping Use   Vaping status: Never Used  Substance and Sexual Activity   Alcohol use: No    Alcohol/week: 0.0 standard drinks of alcohol   Drug use: No   Sexual activity: Never  Other Topics Concern   Not on file  Social History Narrative   09/21/21 resides at [facility]      Lives alone, son lives beside her   Caffeine - none   Social Determinants of Health   Financial Resource Strain: Low Risk  (05/13/2020)   Overall Financial Resource Strain (CARDIA)    Difficulty of Paying  Living Expenses: Not hard at all  Food Insecurity: No Food Insecurity (05/13/2020)   Hunger Vital Sign    Worried About Running Out of Food in the Last Year: Never true    Ran Out of Food in the Last Year: Never true  Transportation Needs: No Transportation Needs (05/13/2020)   PRAPARE - Administrator, Civil Service (Medical): No    Lack of Transportation (Non-Medical): No  Physical Activity: Inactive (05/13/2020)   Exercise Vital Sign    Days of Exercise per Week: 0 days    Minutes of Exercise per Session: 0 min  Stress: No Stress Concern Present (05/13/2020)   [employer] of Occupational Health - Occupational Stress Questionnaire    Feeling of Stress : Not at all  Social  Connections: Socially Isolated (05/13/2020)   Social Connection and Isolation Panel [NHANES]    Frequency of Communication with Friends and Family: More than three times a week    Frequency of Social Gatherings with Friends and Family: More than three times a week    Attends Religious Services: Never    Database administrator or Organizations: No    Attends Banker Meetings: Never    Marital Status: Widowed  Intimate Partner Violence: Not At Risk (05/13/2020)   Humiliation, Afraid, Rape, and Kick questionnaire    Fear of Current or Ex-Partner: No    Emotionally Abused: No    Physically Abused: No    Sexually Abused: No   Family History  Problem Relation Age of Onset   Alzheimer's disease Sister    Cardiomyopathy Brother       VITAL SIGNS BP 128/76   Pulse 80   Temp (!) 97.2 F (36.2 C)   Resp 20   Ht 5\' 5"  (1.651 m)   Wt 113 lb 9.6 oz (51.5 kg)   SpO2 98%   BMI 18.90 kg/m   Outpatient Encounter Medications as of 03/07/2023  Medication Sig   Acetaminophen (TYLENOL ARTHRITIS PAIN PO) Take 650 mg by mouth 3 (three) times daily.   amiodarone (PACERONE) 100 MG tablet Take 100 mg by mouth daily.   aspirin 81 MG chewable tablet Chew 1 tablet (81 mg total) by mouth daily.   Camphor-Menthol-Methyl Sal (SALONPAS) 3.05-27-08 % PTCH Apply 1 patch topically daily. At 9 am   DULoxetine (CYMBALTA) 30 MG capsule Take 30 mg by mouth daily.   feeding supplement (BOOST HIGH PROTEIN) LIQD Take 1 Container by mouth in the morning and at bedtime. 0.06 gram- 1 kcal/mL; amt:   NON FORMULARY Diet - Dysphagia 3   ondansetron (ZOFRAN-ODT) 4 MG disintegrating tablet Take 4 mg by mouth every 8 (eight) hours as needed for nausea or vomiting.   senna-docusate (SENNA S) 8.6-50 MG tablet Take 1 tablet by mouth 2 (two) times daily.   No facility-administered encounter medications on file as of 03/07/2023.     SIGNIFICANT DIAGNOSTIC EXAMS  PREVIOUS  01-04-22: dexa scan: t score  -4.184   NO NEW EXAMS   LABS REVIEWED; PREVIOUS   04-20-22: wbc 5.9; hgb 12.4; hct 39.8; mcv 97.5 plt 204; glucose 86; bun 14; creat 0.83; k+ 4.0; na++ 140; ca 8.4; gfr >60; protein 6.3; albumin 3.4; tsh 3.839; iron 58; tibc 304; vitamin D 45.06    10-09-22: wbc 5.8; hgb 11.1; hct 35.4; mcv 100; plt 257; glucose 80; bun 21; creat 0.95; k+ 4.0; na++ 138; ca 8.5; gfr 57; protein 5.8; protein 3.1   01-11-23: d-dimer: 0.40  NO NEW LABS.    Review of Systems  Constitutional:  Negative for malaise/fatigue.  Respiratory:  Negative for cough and shortness of breath.   Cardiovascular:  Negative for chest pain, palpitations and leg swelling.  Gastrointestinal:  Negative for abdominal pain, constipation and heartburn.  Musculoskeletal:  Negative for back pain, joint pain and myalgias.  Skin: Negative.   Neurological:  Negative for dizziness.  Psychiatric/Behavioral:  The patient is not nervous/anxious.    Physical Exam Constitutional:      General: She is not in acute distress.    Appearance: She is well-developed. She is not diaphoretic.  Neck:     Thyroid: No thyromegaly.  Cardiovascular:     Rate and Rhythm: Normal rate and regular rhythm.     Pulses: Normal pulses.     Heart sounds: Normal heart sounds.  Pulmonary:     Effort: Pulmonary effort is normal. No respiratory distress.     Breath sounds: Normal breath sounds.  Abdominal:     General: Bowel sounds are normal. There is no distension.     Palpations: Abdomen is soft.     Tenderness: There is no abdominal tenderness.  Musculoskeletal:        General: Normal range of motion.     Cervical back: Neck supple.     Right lower leg: No edema.     Left lower leg: No edema.  Lymphadenopathy:     Cervical: No cervical adenopathy.  Skin:    General: Skin is warm and dry.  Neurological:     Mental Status: She is alert. Mental status is at baseline.     Comments: 10-20-22: SLUMS 10/30  Psychiatric:        Mood and Affect: Mood  normal.       ASSESSMENT/ PLAN:  TODAY  Post menopausal osteoporosis: t score -4.184 will monitor   2. Protein calorie malnutrition: albumin 3.1; will continue supplements as directed.   3. History of CVA (cerebrovascular accident) is on asa 81 mg daily   PREVIOUS   4. Atrial fibrillation/sick sinus syndrome: s/p pacemaker will continue amiodarone 100 mg daily; asa 81 mg daily   5. Primary hypertension: b/p 128/72  6. Gastroesophageal reflux disease without esophagitis; will monitor   7. Chronic kidney disease stage 3b: bun 21; creat 0.95; gfr 57  8. Anemia due to chronic kidney disease: hgb 11.1  9. Mixed hyperlipidemia: is off statin due to advanced age.   10. Chronic low back pain: will continue cymbalta 30 mg daily   11. Vitamin D deficiency: level 45.06  12. Vitamin B12: deficiency: level 204  13. Aortic atherosclerosis (ct 08-02-21) is on asa; off statin due to advanced age.      Synthia Innocent NP [facility]  call [phone]

## 2023-03-12 DIAGNOSIS — Z23 Encounter for immunization: Secondary | ICD-10-CM | POA: Diagnosis not present

## 2023-03-30 DIAGNOSIS — L602 Onychogryphosis: Secondary | ICD-10-CM | POA: Diagnosis not present

## 2023-03-30 DIAGNOSIS — I739 Peripheral vascular disease, unspecified: Secondary | ICD-10-CM | POA: Diagnosis not present

## 2023-03-30 DIAGNOSIS — L603 Nail dystrophy: Secondary | ICD-10-CM | POA: Diagnosis not present

## 2023-04-03 ENCOUNTER — Other Ambulatory Visit (HOSPITAL_COMMUNITY)
Admission: RE | Admit: 2023-04-03 | Discharge: 2023-04-03 | Disposition: A | Discharge status: Home / Self Care | Payer: Medicare Other | Source: Skilled Nursing Facility | Attending: Adult Health | Admitting: Adult Health

## 2023-04-03 ENCOUNTER — Encounter: Payer: Self-pay | Admitting: Adult Health

## 2023-04-03 ENCOUNTER — Non-Acute Institutional Stay (SKILLED_NURSING_FACILITY): Payer: Medicare Other | Admitting: Adult Health

## 2023-04-03 DIAGNOSIS — N1832 Chronic kidney disease, stage 3b: Secondary | ICD-10-CM | POA: Insufficient documentation

## 2023-04-03 DIAGNOSIS — E782 Mixed hyperlipidemia: Secondary | ICD-10-CM | POA: Insufficient documentation

## 2023-04-03 DIAGNOSIS — R410 Disorientation, unspecified: Secondary | ICD-10-CM | POA: Diagnosis not present

## 2023-04-03 DIAGNOSIS — R8290 Unspecified abnormal findings in urine: Secondary | ICD-10-CM | POA: Diagnosis not present

## 2023-04-03 DIAGNOSIS — M5459 Other low back pain: Secondary | ICD-10-CM | POA: Insufficient documentation

## 2023-04-03 LAB — BASIC METABOLIC PANEL
Anion gap: 13 (ref 5–15)
BUN: 11 mg/dL (ref 8–23)
CO2: 26 mmol/L (ref 22–32)
Calcium: 9.2 mg/dL (ref 8.9–10.3)
Chloride: 98 mmol/L (ref 98–111)
Creatinine, Ser: 0.82 mg/dL (ref 0.44–1.00)
GFR, Estimated: >60 mL/min (ref >60)
Glucose, Bld: 124 mg/dL — ABNORMAL HIGH (ref 70–99)
Potassium: 3.3 mmol/L — ABNORMAL LOW (ref 3.5–5.1)
Sodium: 137 mmol/L (ref 135–145)

## 2023-04-03 LAB — CBC WITH DIFFERENTIAL/PLATELET
Abs Immature Granulocytes: 0.05 10*3/uL (ref 0.00–0.07)
Basophils Absolute: 0 10*3/uL (ref 0.0–0.1)
Basophils Relative: 0 %
Eosinophils Absolute: 0 10*3/uL (ref 0.0–0.5)
Eosinophils Relative: 0 %
HCT: 39.6 % (ref 36.0–46.0)
Hemoglobin: 12.7 g/dL (ref 12.0–15.0)
Immature Granulocytes: 1 %
Lymphocytes Relative: 10 %
Lymphs Abs: 1.1 10*3/uL (ref 0.7–4.0)
MCH: 31 pg (ref 26.0–34.0)
MCHC: 32.1 g/dL (ref 30.0–36.0)
MCV: 96.6 fL (ref 80.0–100.0)
Monocytes Absolute: 1 10*3/uL (ref 0.1–1.0)
Monocytes Relative: 10 %
Neutro Abs: 8.3 10*3/uL — ABNORMAL HIGH (ref 1.7–7.7)
Neutrophils Relative %: 79 %
Platelets: 292 10*3/uL (ref 150–400)
RBC: 4.1 MIL/uL (ref 3.87–5.11)
RDW: 13.1 % (ref 11.5–15.5)
WBC: 10.5 10*3/uL (ref 4.0–10.5)
nRBC: 0 % (ref 0.0–0.2)

## 2023-04-03 LAB — URINALYSIS, W/ REFLEX TO CULTURE (INFECTION SUSPECTED)
Bilirubin Urine: NEGATIVE
Glucose, UA: NEGATIVE mg/dL
Ketones, ur: 5 mg/dL — AB
Nitrite: POSITIVE — AB
Protein, ur: 30 mg/dL — AB
Specific Gravity, Urine: 1.016 (ref 1.005–1.030)
pH: 5 (ref 5.0–8.0)

## 2023-04-03 NOTE — Progress Notes (Unsigned)
Location:  [facility] Room Number: 121 Place of Service:  SNF (31)   CODE STATUS: dnr  Allergies  Allergen Reactions   Septra Ds [Sulfamethoxazole-Trimethoprim]    Lisinopril Other (See Comments)    Cough    Chief Complaint  Patient presents with   Acute Visit    AMS     HPI:  Staff report that she has had altered mental status. Over this past weekend she was cutting her ice cream like a sandwich. Today she emptied out her drawers; but could not put the items back as there was no space. She is requiring increased assistance with her meals. There are no reports of fevers; no reports of dysuria; back pain; nausea or vomiting.   Past Medical History:  Diagnosis Date   Acute metabolic encephalopathy 07/2021   Atrial fibrillation (HCC)    Bleeding stomach ulcer 1980s?   CVA (cerebral vascular accident) (HCC) 07/2021   DIVERTICULOSIS, COLON 11/22/2006   Headache(784.0)    "very often; not regular" (11/25/2015)   Heart block    hx of   Hip fracture (HCC) 07/2021   History of blood transfusion 1980s   "when I had the bleeding ulcer"   HYPERLIPIDEMIA 11/22/2006   Hypertension    Melanoma (HCC)    LLE   Menopausal syndrome (hot flashes)    OSTEOPOROSIS 11/22/2006   Presence of permanent cardiac pacemaker    Vitamin D deficiency 08/21/2021    Past Surgical History:  Procedure Laterality Date   APPENDECTOMY     BUNIONECTOMY Right    CATARACT EXTRACTION W/ INTRAOCULAR LENS  IMPLANT, BILATERAL Bilateral 2000s   CHOLECYSTECTOMY OPEN     EP IMPLANTABLE DEVICE N/A 11/25/2015   Procedure: Pacemaker Implant;  Surgeon: Will Jorja Loa, MD;  Location: [facility] INVASIVE CV LAB;  Service: Cardiovascular;  Laterality: N/A;   FEMUR IM NAIL Left 07/08/2014   Procedure: INTRAMEDULLARY (IM) NAIL ;  Surgeon: Cammy Copa, MD;  Location: [facility] ORS;  Service: Orthopedics;  Laterality: Left;   FRACTURE SURGERY     HIP ARTHROPLASTY Right 08/20/2021   Procedure:  ARTHROPLASTY BIPOLAR HIP (HEMIARTHROPLASTY);  Surgeon: Myrene Galas, MD;  Location: [facility] OR;  Service: Orthopedics;  Laterality: Right;   IR KYPHO LUMBAR INC FX REDUCE BONE BX UNI/BIL CANNULATION INC/IMAGING  03/21/2021   PACEMAKER INSERTION     REVISION TOTAL HIP ARTHROPLASTY Left 06/2014    Social History   Socioeconomic History   Marital status: Widowed    Spouse name: Not on file   Number of children: 2   Years of education: 42   Highest education level: Not on file  Occupational History    Comment: retired  Tobacco Use   Smoking status: Never    Passive exposure: Never   Smokeless tobacco: Never  Vaping Use   Vaping status: Never Used  Substance and Sexual Activity   Alcohol use: No    Alcohol/week: 0.0 standard drinks of alcohol   Drug use: No   Sexual activity: Never  Other Topics Concern   Not on file  Social History Narrative   09/21/21 resides at [facility]      Lives alone, son lives beside her   Caffeine - none   Social Determinants of Health   Financial Resource Strain: Low Risk  (05/13/2020)   Overall Financial Resource Strain (CARDIA)    Difficulty of Paying Living Expenses: Not hard at all  Food Insecurity: No Food Insecurity (05/13/2020)   Hunger Vital  Sign    Worried About Programme researcher, broadcasting/film/video in the Last Year: Never true    Ran Out of Food in the Last Year: Never true  Transportation Needs: No Transportation Needs (05/13/2020)   PRAPARE - Administrator, Civil Service (Medical): No    Lack of Transportation (Non-Medical): No  Physical Activity: Inactive (05/13/2020)   Exercise Vital Sign    Days of Exercise per Week: 0 days    Minutes of Exercise per Session: 0 min  Stress: No Stress Concern Present (05/13/2020)   [employer] of Occupational Health - Occupational Stress Questionnaire    Feeling of Stress : Not at all  Social Connections: Socially Isolated (05/13/2020)   Social Connection and Isolation Panel [NHANES]    Frequency  of Communication with Friends and Family: More than three times a week    Frequency of Social Gatherings with Friends and Family: More than three times a week    Attends Religious Services: Never    Database administrator or Organizations: No    Attends Banker Meetings: Never    Marital Status: Widowed  Intimate Partner Violence: Not At Risk (05/13/2020)   Humiliation, Afraid, Rape, and Kick questionnaire    Fear of Current or Ex-Partner: No    Emotionally Abused: No    Physically Abused: No    Sexually Abused: No   Family History  Problem Relation Age of Onset   Alzheimer's disease Sister    Cardiomyopathy Brother       VITAL SIGNS BP 136/76   Pulse 74   Temp (!) 97.2 F (36.2 C)   Resp 18   Ht 5\' 5"  (1.651 m)   Wt 110 lb (49.9 kg)   SpO2 97%   BMI 18.30 kg/m   Outpatient Encounter Medications as of 04/03/2023  Medication Sig   Acetaminophen (TYLENOL ARTHRITIS PAIN PO) Take 650 mg by mouth 3 (three) times daily.   amiodarone (PACERONE) 100 MG tablet Take 100 mg by mouth daily.   aspirin 81 MG chewable tablet Chew 1 tablet (81 mg total) by mouth daily.   Camphor-Menthol-Methyl Sal (SALONPAS) 3.05-27-08 % PTCH Apply 1 patch topically daily. At 9 am   DULoxetine (CYMBALTA) 30 MG capsule Take 30 mg by mouth daily.   feeding supplement (BOOST HIGH PROTEIN) LIQD Take 1 Container by mouth in the morning and at bedtime. 0.06 gram- 1 kcal/mL; amt:   NON FORMULARY Diet - Dysphagia 3   ondansetron (ZOFRAN-ODT) 4 MG disintegrating tablet Take 4 mg by mouth every 8 (eight) hours as needed for nausea or vomiting.   senna-docusate (SENNA S) 8.6-50 MG tablet Take 1 tablet by mouth 2 (two) times daily.   No facility-administered encounter medications on file as of 04/03/2023.     SIGNIFICANT DIAGNOSTIC EXAMS  PREVIOUS  01-04-22: dexa scan: t score -4.184   NO NEW EXAMS   LABS REVIEWED; PREVIOUS   04-20-22: wbc 5.9; hgb 12.4; hct 39.8; mcv 97.5 plt 204;  glucose 86; bun 14; creat 0.83; k+ 4.0; na++ 140; ca 8.4; gfr >60; protein 6.3; albumin 3.4; tsh 3.839; iron 58; tibc 304; vitamin D 45.06    10-09-22: wbc 5.8; hgb 11.1; hct 35.4; mcv 100; plt 257; glucose 80; bun 21; creat 0.95; k+ 4.0; na++ 138; ca 8.5; gfr 57; protein 5.8; protein 3.1   01-11-23: d-dimer: 0.40  NO NEW LABS.    Review of Systems  Unable to perform ROS: Mental acuity   Physical  Exam Constitutional:      General: She is not in acute distress.    Appearance: She is well-developed. She is not diaphoretic.  Neck:     Thyroid: No thyromegaly.  Cardiovascular:     Rate and Rhythm: Normal rate and regular rhythm.     Pulses: Normal pulses.     Heart sounds: Normal heart sounds.  Pulmonary:     Effort: Pulmonary effort is normal. No respiratory distress.     Breath sounds: Normal breath sounds.  Abdominal:     General: Bowel sounds are normal. There is no distension.     Palpations: Abdomen is soft.     Tenderness: There is no abdominal tenderness.  Musculoskeletal:        General: Normal range of motion.     Cervical back: Neck supple.     Right lower leg: No edema.     Left lower leg: No edema.  Lymphadenopathy:     Cervical: No cervical adenopathy.  Skin:    General: Skin is warm and dry.  Neurological:     Mental Status: She is alert.     Comments: 10-20-22: SLUMS 10/30  Does have increased disorientation   Psychiatric:        Mood and Affect: Mood normal.      ASSESSMENT/ PLAN:  TODAY  Altered mental status; these behaviors could be related to use of cymbalta; will wean off this medication. A urine culture has been collected; will get cbc; bmp; will monitor her status and will further treat as indicated.    Synthia Innocent NP [facility]  call [phone]

## 2023-04-05 ENCOUNTER — Non-Acute Institutional Stay (SKILLED_NURSING_FACILITY): Payer: Medicare Other | Admitting: Internal Medicine

## 2023-04-05 ENCOUNTER — Encounter: Payer: Self-pay | Admitting: Internal Medicine

## 2023-04-05 DIAGNOSIS — N390 Urinary tract infection, site not specified: Secondary | ICD-10-CM | POA: Diagnosis not present

## 2023-04-05 DIAGNOSIS — B9620 Unspecified Escherichia coli [E. coli] as the cause of diseases classified elsewhere: Secondary | ICD-10-CM

## 2023-04-05 DIAGNOSIS — R4189 Other symptoms and signs involving cognitive functions and awareness: Secondary | ICD-10-CM

## 2023-04-05 DIAGNOSIS — I482 Chronic atrial fibrillation, unspecified: Secondary | ICD-10-CM

## 2023-04-05 DIAGNOSIS — E876 Hypokalemia: Secondary | ICD-10-CM | POA: Diagnosis not present

## 2023-04-05 DIAGNOSIS — R29818 Other symptoms and signs involving the nervous system: Secondary | ICD-10-CM

## 2023-04-05 NOTE — Assessment & Plan Note (Addendum)
She had acute mental status changes manifested as attempting to cut ice cream in a cup, thinking it was a hamburger.  Labs reveal hypokalemia.  Urine culture reveals greater than 100,000 colonies of E. coli .Pending the sensitivities nitrofurantoin will be prescribed.  If mental status changes persist or progress; TSH will be updated as she is on amiodarone.

## 2023-04-05 NOTE — Progress Notes (Signed)
NURSING HOME LOCATION:  [facility] ROOM NUMBER:  121 D  CODE STATUS:  DNR  PCP:  Synthia Innocent NP  This is a nursing facility follow up visit for specific acute issue of acute mental status changes in context of positive urine culture . The acute mental status changes were manifest as confusion; [name] Small was cutting her cup of ice cream thinking it was a hamburger.  Also [name] Small is due for routine follow-up for medical management issues to document compliance with Regulation 483.30 (c) in The Long Term Care Survey Manual Phase 2 which mandates caregiver visit ( visits can alternate among physician, PA or NP as per statutes) within 10 days of 30 days / 60 days/ 90 days post admission to SNF date    Interim medical record and care since last SNF visit was updated with review of diagnostic studies and change in clinical status since last visit were documented.  HPI: [name] Small is a permanent resident of this facility with medical diagnoses of chronic atrial fibrillation, history of bleeding stomach ulcer, history of stroke, history of diverticulosis, dyslipidemia, history of melanoma, and history of vitamin D deficiency.  Labs were performed 11/12 to assess the acute mental status changes. Hypokalemia was present with a potassium of 3.3.  Her CKD has improved from high stage IIIa to stage II with creatinine of 0.82 & GFR of greater than 60.  Anemia had resolved with H/H rising from 11.1/35.7/39.6 to 12.7/39.6.  White blood count was high normal at 10,500 with left shift.  Urinalysis had revealed trace leukocytes  with positive nitrites.  Culture grew > 100,000 colonies of E. coli.  It is to be noted that [name] Small is allergic to sulfa. In reference to acute mental status changes; the most recent TSH was 3.081, relatively stable over the prior 6 months.  It should be noted that [name] Small is on amiodarone which can impact thyroid function.  Review of systems: Dementia invalidated responses.  Her only  complaint is chronic back pain.  The remaining review of systems was negative with special reference to GU symptoms. In reference to the hypokalemia, [name] Small denied diarrhea or nausea and vomiting.  Additionally med list was reviewed and there are no meds present which typically are associated with hypokalemia  Constitutional: No fever, significant weight change, fatigue  Eyes: No redness, discharge, pain, vision change ENT/mouth: No nasal congestion,  purulent discharge, earache, change in hearing, sore throat  Cardiovascular: No chest pain, palpitations, paroxysmal nocturnal dyspnea, claudication, edema  Respiratory: No cough, sputum production, hemoptysis, DOE, significant snoring, apnea   Gastrointestinal: No heartburn, dysphagia, abdominal pain, nausea /vomiting, rectal bleeding, melena, change in bowels Genitourinary: No dysuria, hematuria, pyuria, incontinence, nocturia Dermatologic: No rash, pruritus, change in appearance of skin Neurologic: No dizziness, headache, syncope, seizures, numbness, tingling Psychiatric: No significant anxiety, depression, insomnia, anorexia Endocrine: No change in hair/skin/nails, excessive thirst, excessive hunger, excessive urination  Hematologic/lymphatic: No significant bruising, lymphadenopathy, abnormal bleeding Allergy/immunology: No itchy/watery eyes, significant sneezing, urticaria, angioedema  Physical exam:  Pertinent or positive findings: [name] Small appears her age and chronically ill and suboptimally nourished. Her hair is disheveled. There is a faint scar across her forehead.  [name] Small has a flat hyperpigmented lesion @ the right temple.  Clinically the rhythm is regular.Pedal pulses are decreased.  [name] Small has marked IOW and limb atrophy.  [name] Small has a coarse tremor of the hands, greater on the right.  [name] Small had difficulty following commands.  General appearance:  no acute distress, increased  work of breathing is present.   Lymphatic: No lymphadenopathy about the head,  neck, axilla. Eyes: No conjunctival inflammation or lid edema is present. There is no scleral icterus. Ears:  External ear exam shows no significant lesions or deformities.   Nose:  External nasal examination shows no deformity or inflammation. Nasal mucosa are pink and moist without lesions, exudates Oral exam:  Lips and gums are healthy appearing. There is no oropharyngeal erythema or exudate. Neck:  No thyromegaly, masses, tenderness noted.    Heart:  No gallop, murmur, click, rub .   Lungs: Chest clear to auscultation without wheezes, rhonchi, rales, rubs. Abdomen: Bowel sounds are normal. Abdomen is soft and nontender with no organomegaly, hernias, masses. GU: Deferred  Extremities:  No cyanosis, clubbing, edema  Neurologic exam :Balance, Rhomberg, finger to nose testing could not be completed due to clinical state Deep tendon reflexes are equal Skin: Warm & dry w/o tenting. No significant rash.  See summary under each active problem in the Problem List with associated updated therapeutic plan

## 2023-04-05 NOTE — Progress Notes (Signed)
NURSING HOME LOCATION:  [facility] ROOM NUMBER:  121 D  CODE STATUS:  DNR  PCP:  Synthia Innocent NP  This is a nursing facility follow up visit for specific acute issue of acute mental status changes manifested as dyspnea on abnormal urine culture and sensitivity.  The acute mental status changes with confusion; she was cutting her cup of ice cream thinking Also she is due for routine follow-up for medical management issues to document compliance with Regulation 483.30 (c) in The Long Term Care Survey Manual Phase 2 which mandates caregiver visit ( visits can alternate among physician, PA or NP as per statutes) within 10 days of 30 days / 60 days/ 90 days post admission to SNF date    Interim medical record and care since last SNF visit was updated with review of diagnostic studies and change in clinical status since last visit were documented.  HPI: She is a permanent resident of this facility with medical diagnoses of chronic atrial fibrillation, history of bleeding stomach ulcer, history of stroke, history of diverticulosis, dyslipidemia, history of melanoma, and history of vitamin D deficiency. Labs were performed/24 to assess the acute mental status changes.  It was present with a potassium of 3.3.  Her CKD has improved from high stage IIIa to stage II with creatinine of 0 GFR of greater than 60.  Anemia had resolved with H/H rising from 11.1/35.7/39.6.  White blood count was high normal at 10,500 with left shift.  Urine had revealed with positive nitrites.  Culture grew thousand colonies of E. coli.  It is to be noted that she is allergic to sulfa. In reference to his mental status changes; the most recent TSH was 3.081, relatively stable over the prior 6 months.  Be noted that she is on amiodarone which can impact thyroid function.  Review of systems: Dementia invalidated responses. Date given as   Constitutional: No fever, significant weight change, fatigue  Eyes: No  redness, discharge, pain, vision change ENT/mouth: No nasal congestion,  purulent discharge, earache, change in hearing, sore throat  Cardiovascular: No chest pain, palpitations, paroxysmal nocturnal dyspnea, claudication, edema  Respiratory: No cough, sputum production, hemoptysis, DOE, significant snoring, apnea   Gastrointestinal: No heartburn, dysphagia, abdominal pain, nausea /vomiting, rectal bleeding, melena, change in bowels Genitourinary: No dysuria, hematuria, pyuria, incontinence, nocturia Musculoskeletal: No joint stiffness, joint swelling, weakness, pain Dermatologic: No rash, pruritus, change in appearance of skin Neurologic: No dizziness, headache, syncope, seizures, numbness, tingling Psychiatric: No significant anxiety, depression, insomnia, anorexia Endocrine: No change in hair/skin/nails, excessive thirst, excessive hunger, excessive urination  Hematologic/lymphatic: No significant bruising, lymphadenopathy, abnormal bleeding Allergy/immunology: No itchy/watery eyes, significant sneezing, urticaria, angioedema  Physical exam:  Pertinent or positive findings: General appearance: Adequately nourished; no acute distress, increased work of breathing is present.   Lymphatic: No lymphadenopathy about the head, neck, axilla. Eyes: No conjunctival inflammation or lid edema is present. There is no scleral icterus. Ears:  External ear exam shows no significant lesions or deformities.   Nose:  External nasal examination shows no deformity or inflammation. Nasal mucosa are pink and moist without lesions, exudates Oral exam:  Lips and gums are healthy appearing. There is no oropharyngeal erythema or exudate. Neck:  No thyromegaly, masses, tenderness noted.    Heart:  Normal rate and regular rhythm. S1 and S2 normal without gallop, murmur, click, rub .  Lungs: Chest clear to auscultation without wheezes, rhonchi, rales, rubs. Abdomen: Bowel sounds are normal. Abdomen is soft  and  nontender with no organomegaly, hernias, masses. GU: Deferred  Extremities:  No cyanosis, clubbing, edema  Neurologic exam : Cn 2-7 intact Strength equal  in upper & lower extremities Balance, Rhomberg, finger to nose testing could not be completed due to clinical state Deep tendon reflexes are equal Skin: Warm & dry w/o tenting. No significant lesions or rash.  See summary under each active problem in the Problem List with associated updated therapeutic plan

## 2023-04-05 NOTE — Patient Instructions (Signed)
See assessment and plan under each diagnosis in the problem list and acutely for this visit 

## 2023-04-05 NOTE — Assessment & Plan Note (Addendum)
Clinically rhythm is regular and rate well-controlled.  Last 2 EKGs on record revealed NSR. She remains on amiodarone  prophylaxis.   Last TSH was therapeutic and relatively stable. If AMS changes fail to resolve with UTI treatment ; TSH will be rechecked.

## 2023-04-06 LAB — URINE CULTURE: Culture: >=100000 — AB

## 2023-04-09 ENCOUNTER — Other Ambulatory Visit (HOSPITAL_COMMUNITY)
Admission: RE | Admit: 2023-04-09 | Discharge: 2023-04-09 | Disposition: A | Discharge status: Home / Self Care | Payer: Medicare Other | Source: Skilled Nursing Facility | Attending: Internal Medicine | Admitting: Internal Medicine

## 2023-04-09 DIAGNOSIS — I10 Essential (primary) hypertension: Secondary | ICD-10-CM | POA: Diagnosis not present

## 2023-04-09 LAB — POTASSIUM: Potassium: 3.9 mmol/L (ref 3.5–5.1)

## 2023-04-10 ENCOUNTER — Other Ambulatory Visit (HOSPITAL_COMMUNITY)
Admission: RE | Admit: 2023-04-10 | Discharge: 2023-04-10 | Disposition: A | Discharge status: Home / Self Care | Payer: Medicare Other | Source: Skilled Nursing Facility | Attending: Adult Health | Admitting: Adult Health

## 2023-04-10 ENCOUNTER — Non-Acute Institutional Stay (SKILLED_NURSING_FACILITY): Payer: Medicare Other | Admitting: Adult Health

## 2023-04-10 DIAGNOSIS — J189 Pneumonia, unspecified organism: Secondary | ICD-10-CM | POA: Diagnosis not present

## 2023-04-10 DIAGNOSIS — Z950 Presence of cardiac pacemaker: Secondary | ICD-10-CM | POA: Diagnosis not present

## 2023-04-10 DIAGNOSIS — J8410 Pulmonary fibrosis, unspecified: Secondary | ICD-10-CM | POA: Diagnosis not present

## 2023-04-10 DIAGNOSIS — I10 Essential (primary) hypertension: Secondary | ICD-10-CM | POA: Insufficient documentation

## 2023-04-10 DIAGNOSIS — J9811 Atelectasis: Secondary | ICD-10-CM | POA: Diagnosis not present

## 2023-04-10 LAB — CBC WITH DIFFERENTIAL/PLATELET
Abs Immature Granulocytes: 0.02 10*3/uL (ref 0.00–0.07)
Basophils Absolute: 0 10*3/uL (ref 0.0–0.1)
Basophils Relative: 0 %
Eosinophils Absolute: 0.1 10*3/uL (ref 0.0–0.5)
Eosinophils Relative: 1 %
HCT: 44.1 % (ref 36.0–46.0)
Hemoglobin: 14 g/dL (ref 12.0–15.0)
Immature Granulocytes: 0 %
Lymphocytes Relative: 16 %
Lymphs Abs: 1.2 10*3/uL (ref 0.7–4.0)
MCH: 30.5 pg (ref 26.0–34.0)
MCHC: 31.7 g/dL (ref 30.0–36.0)
MCV: 96.1 fL (ref 80.0–100.0)
Monocytes Absolute: 0.6 10*3/uL (ref 0.1–1.0)
Monocytes Relative: 9 %
Neutro Abs: 5.4 10*3/uL (ref 1.7–7.7)
Neutrophils Relative %: 74 %
Platelets: 256 10*3/uL (ref 150–400)
RBC: 4.59 MIL/uL (ref 3.87–5.11)
RDW: 13.2 % (ref 11.5–15.5)
WBC: 7.4 10*3/uL (ref 4.0–10.5)
nRBC: 0 % (ref 0.0–0.2)

## 2023-04-10 LAB — BASIC METABOLIC PANEL WITH GFR
Anion gap: 12 (ref 5–15)
BUN: 18 mg/dL (ref 8–23)
CO2: 26 mmol/L (ref 22–32)
Calcium: 8.7 mg/dL — ABNORMAL LOW (ref 8.9–10.3)
Chloride: 96 mmol/L — ABNORMAL LOW (ref 98–111)
Creatinine, Ser: 0.97 mg/dL (ref 0.44–1.00)
GFR, Estimated: 55 mL/min — ABNORMAL LOW
Glucose, Bld: 126 mg/dL — ABNORMAL HIGH (ref 70–99)
Potassium: 3.6 mmol/L (ref 3.5–5.1)
Sodium: 134 mmol/L — ABNORMAL LOW (ref 135–145)

## 2023-04-10 NOTE — Progress Notes (Unsigned)
Location:  [facility] Room Number: North/121/D Place of Service:  SNF (31)   CODE STATUS: DNR  Allergies  Allergen Reactions   Septra Ds [Sulfamethoxazole-Trimethoprim]    Lisinopril Other (See Comments)    Cough    Chief Complaint  Patient presents with   Acute Visit     sinus congestion     HPI:  She is presently being treated for an uti. She is listless has a cough; poor po intake; urine remains foul. She appears volume depleted; dry mouth; dry lips. No tenting of skin present. There are no reports of fevers present. She did have one vomiting episode over the weekend. The staff is concerned about aspiration.   Past Medical History:  Diagnosis Date   Acute metabolic encephalopathy 07/2021   Atrial fibrillation (HCC)    Bleeding stomach ulcer 1980s?   CVA (cerebral vascular accident) (HCC) 07/2021   DIVERTICULOSIS, COLON 11/22/2006   Headache(784.0)    "very often; not regular" (11/25/2015)   Heart block    hx of   Hip fracture (HCC) 07/2021   History of blood transfusion 1980s   "when I had the bleeding ulcer"   HYPERLIPIDEMIA 11/22/2006   Hypertension    Melanoma (HCC)    LLE   Menopausal syndrome (hot flashes)    OSTEOPOROSIS 11/22/2006   Presence of permanent cardiac pacemaker    Vitamin D deficiency 08/21/2021    Past Surgical History:  Procedure Laterality Date   APPENDECTOMY     BUNIONECTOMY Right    CATARACT EXTRACTION W/ INTRAOCULAR LENS  IMPLANT, BILATERAL Bilateral 2000s   CHOLECYSTECTOMY OPEN     EP IMPLANTABLE DEVICE N/A 11/25/2015   Procedure: Pacemaker Implant;  Surgeon: Will Jorja Loa, MD;  [facility]: [facility];  Service: Cardiovascular;  Laterality: N/A;   FEMUR IM NAIL Left 07/08/2014   Procedure: INTRAMEDULLARY (IM) NAIL ;  Surgeon: Cammy Copa, MD;  Location: [facility];  Service: Orthopedics;  Laterality: Left;   FRACTURE SURGERY     HIP ARTHROPLASTY Right 08/20/2021   Procedure: ARTHROPLASTY  BIPOLAR HIP (HEMIARTHROPLASTY);  Surgeon: Myrene Galas, MD;  Location: [facility] OR;  Service: Orthopedics;  Laterality: Right;   IR KYPHO LUMBAR INC FX REDUCE BONE BX UNI/BIL CANNULATION INC/IMAGING  03/21/2021   PACEMAKER INSERTION     REVISION TOTAL HIP ARTHROPLASTY Left 06/2014    Social History   Socioeconomic History   Marital status: Widowed    Spouse name: Not on file   Number of children: 2   Years of education: 81   Highest education level: Not on file  Occupational History    Comment: retired  Tobacco Use   Smoking status: Never    Passive exposure: Never   Smokeless tobacco: Never  Vaping Use   Vaping status: Never Used  Substance and Sexual Activity   Alcohol use: No    Alcohol/week: 0.0 standard drinks of alcohol   Drug use: No   Sexual activity: Never  Other Topics Concern   Not on file  Social History Narrative   09/21/21 resides at [facility]      Lives alone, son lives beside her   Caffeine - none   Social Determinants of Health   Financial Resource Strain: Low Risk  (05/13/2020)   Overall Financial Resource Strain (CARDIA)    Difficulty of Paying Living Expenses: Not hard at all  Food Insecurity: No Food Insecurity (05/13/2020)   Hunger Vital Sign    Worried About Running  Out of Food in the Last Year: Never true    Ran Out of Food in the Last Year: Never true  Transportation Needs: No Transportation Needs (05/13/2020)   PRAPARE - Administrator, Civil Service (Medical): No    Lack of Transportation (Non-Medical): No  Physical Activity: Inactive (05/13/2020)   Exercise Vital Sign    Days of Exercise per Week: 0 days    Minutes of Exercise per Session: 0 min  Stress: No Stress Concern Present (05/13/2020)   [employer] of Occupational Health - Occupational Stress Questionnaire    Feeling of Stress : Not at all  Social Connections: Socially Isolated (05/13/2020)   Social Connection and Isolation Panel [NHANES]    Frequency of  Communication with Friends and Family: More than three times a week    Frequency of Social Gatherings with Friends and Family: More than three times a week    Attends Religious Services: Never    Database administrator or Organizations: No    Attends Banker Meetings: Never    Marital Status: Widowed  Intimate Partner Violence: Not At Risk (05/13/2020)   Humiliation, Afraid, Rape, and Kick questionnaire    Fear of Current or Ex-Partner: No    Emotionally Abused: No    Physically Abused: No    Sexually Abused: No   Family History  Problem Relation Age of Onset   Alzheimer's disease Sister    Cardiomyopathy Brother       VITAL SIGNS BP 138/69   Pulse 75   Temp 97.6 F (36.4 C)   Resp 20   Ht 5\' 5"  (1.651 m)   Wt 110 lb (49.9 kg)   SpO2 98%   BMI 18.30 kg/m   Outpatient Encounter Medications as of 04/10/2023  Medication Sig   Acetaminophen (TYLENOL ARTHRITIS PAIN PO) Take 650 mg by mouth 3 (three) times daily.   amiodarone (PACERONE) 100 MG tablet Take 100 mg by mouth daily.   aspirin 81 MG chewable tablet Chew 1 tablet (81 mg total) by mouth daily.   Camphor-Menthol-Methyl Sal (SALONPAS) 3.05-27-08 % PTCH Apply 1 patch topically daily. At 9 am   DULoxetine (CYMBALTA) 30 MG capsule Take 30 mg by mouth daily.   feeding supplement (BOOST HIGH PROTEIN) LIQD Take 1 Container by mouth in the morning and at bedtime. 0.06 gram- 1 kcal/mL; amt:   NON FORMULARY Diet - Dysphagia 3   ondansetron (ZOFRAN-ODT) 4 MG disintegrating tablet Take 4 mg by mouth every 8 (eight) hours as needed for nausea or vomiting.   senna-docusate (SENNA S) 8.6-50 MG tablet Take 1 tablet by mouth 2 (two) times daily.   No facility-administered encounter medications on file as of 04/10/2023.     SIGNIFICANT DIAGNOSTIC EXAMS  PREVIOUS  01-04-22: dexa scan: t score -4.184   NO NEW EXAMS   LABS REVIEWED; PREVIOUS   04-20-22: wbc 5.9; hgb 12.4; hct 39.8; mcv 97.5 plt 204; glucose 86;  bun 14; creat 0.83; k+ 4.0; na++ 140; ca 8.4; gfr >60; protein 6.3; albumin 3.4; tsh 3.839; iron 58; tibc 304; vitamin D 45.06    10-09-22: wbc 5.8; hgb 11.1; hct 35.4; mcv 100; plt 257; glucose 80; bun 21; creat 0.95; k+ 4.0; na++ 138; ca 8.5; gfr 57; protein 5.8; protein 3.1   01-11-23: d-dimer: 0.40  TODAY  04-03-23: wbc 10.5; hgb 12.7; hct 39.6 plt 96.6 plt 292; glucose 124; bun 11; creat 0.82; k+ 3.3; na++ 137; ca 9.2; gfr >60;  urine culture: e-coli: macrobid 04-09-23: k+ 3.9 04-10-23: wbc 7.4; hgb 14.0; hct 44.1; mcv 96.1; plt 256; glucose 126; bun 18; creat 0.97; k+ 3.6; na++ 134; ca 8.7; gfr 55      Review of Systems  Unable to perform ROS: Mental acuity    Physical Exam Constitutional:      General: She is not in acute distress.    Appearance: She is well-developed. She is not diaphoretic.  Neck:     Thyroid: No thyromegaly.  Cardiovascular:     Rate and Rhythm: Normal rate and regular rhythm.     Pulses: Normal pulses.     Heart sounds: Normal heart sounds.  Pulmonary:     Effort: Pulmonary effort is normal. No respiratory distress.     Breath sounds: Rhonchi and rales present.  Abdominal:     General: Bowel sounds are normal. There is no distension.     Palpations: Abdomen is soft.     Tenderness: There is no abdominal tenderness.  Musculoskeletal:        General: Normal range of motion.     Cervical back: Neck supple.     Right lower leg: No edema.     Left lower leg: No edema.  Lymphadenopathy:     Cervical: No cervical adenopathy.  Skin:    General: Skin is warm and dry.  Neurological:     Mental Status: She is alert. Mental status is at baseline.     Comments: 10-20-22: SLUMS 10/30  Does have increased disorientation    Psychiatric:        Mood and Affect: Mood normal.      ASSESSMENT/ PLAN:  TODAY  HCAP (health care associated pneumonia): will begin augmentin 875 mg twice daily through 04-17-23; will begin mucinex 600 mg twice daily through  04-17-23; will begin IVF NS at 75 cc per hour for one liter.    Synthia Innocent NP [facility]   call [phone]

## 2023-04-11 ENCOUNTER — Encounter: Payer: Self-pay | Admitting: Adult Health

## 2023-04-11 DIAGNOSIS — J189 Pneumonia, unspecified organism: Secondary | ICD-10-CM | POA: Insufficient documentation

## 2023-04-17 ENCOUNTER — Encounter: Payer: Self-pay | Admitting: Adult Health

## 2023-04-17 NOTE — Progress Notes (Unsigned)
Location:  [facility] Room Number: 121 Place of Service:  SNF (31)   CODE STATUS: dnr   Allergies  Allergen Reactions   Septra Ds [Sulfamethoxazole-Trimethoprim]    Lisinopril Other (See Comments)    Cough    Chief Complaint  Patient presents with   Acute Visit    Care plan meeting     HPI:  We have come together for her care plan meeting. BIMS 12/15 mood 8/30: decreased appetite; decreased energy; nervous; some depression. She uses wheelchair; with one fall without injury. She requires supervision to dependent assist with her adl care. She is frequently incontinent of bladder and bowel. Dietary: regular diet appetite 25-100%; feeds self; weight 105.8 pounds. Therapy: none at this time. She continues to be followed for her chronic illnesses including:  Atrial fibrillation, chronic   Anemia due to stage 3 chronic kidney disease   Severe protein calorie malnutrition  Past Medical History:  Diagnosis Date   Acute metabolic encephalopathy 07/2021   Atrial fibrillation (HCC)    Bleeding stomach ulcer 1980s?   CVA (cerebral vascular accident) (HCC) 07/2021   DIVERTICULOSIS, COLON 11/22/2006   Headache(784.0)    "very often; not regular" (11/25/2015)   Heart block    hx of   Hip fracture (HCC) 07/2021   History of blood transfusion 1980s   "when I had the bleeding ulcer"   HYPERLIPIDEMIA 11/22/2006   Hypertension    Melanoma (HCC)    LLE   Menopausal syndrome (hot flashes)    OSTEOPOROSIS 11/22/2006   Presence of permanent cardiac pacemaker    Vitamin D deficiency 08/21/2021    Past Surgical History:  Procedure Laterality Date   APPENDECTOMY     BUNIONECTOMY Right    CATARACT EXTRACTION W/ INTRAOCULAR LENS  IMPLANT, BILATERAL Bilateral 2000s   CHOLECYSTECTOMY OPEN     EP IMPLANTABLE DEVICE N/A 11/25/2015   Procedure: Pacemaker Implant;  Surgeon: Will Jorja Loa, MD;  Location: [facility];  Service: Cardiovascular;  Laterality: N/A;    FEMUR IM NAIL Left 07/08/2014   Procedure: INTRAMEDULLARY (IM) NAIL ;  Surgeon: Cammy Copa, MD;  Location: [facility];  Service: Orthopedics;  Laterality: Left;   FRACTURE SURGERY     HIP ARTHROPLASTY Right 08/20/2021   Procedure: ARTHROPLASTY BIPOLAR HIP (HEMIARTHROPLASTY);  Surgeon: Myrene Galas, MD;  Location: [facility] OR;  Service: Orthopedics;  Laterality: Right;   IR KYPHO LUMBAR INC FX REDUCE BONE BX UNI/BIL CANNULATION INC/IMAGING  03/21/2021   PACEMAKER INSERTION     REVISION TOTAL HIP ARTHROPLASTY Left 06/2014    Social History   Socioeconomic History   Marital status: Widowed    Spouse name: Not on file   Number of children: 2   Years of education: 12   Highest education level: Not on file  Occupational History    Comment: retired  Tobacco Use   Smoking status: Never    Passive exposure: Never   Smokeless tobacco: Never  Vaping Use   Vaping status: Never Used  Substance and Sexual Activity   Alcohol use: No    Alcohol/week: 0.0 standard drinks of alcohol   Drug use: No   Sexual activity: Never  Other Topics Concern   Not on file  Social History Narrative   09/21/21 [facility]      Lives alone, son lives beside her   Caffeine - none   Social Determinants of Health   Financial Resource Strain: Low Risk  (05/13/2020)  Overall Financial Resource Strain (CARDIA)    Difficulty of Paying Living Expenses: Not hard at all  Food Insecurity: No Food Insecurity (05/13/2020)   Hunger Vital Sign    Worried About Running Out of Food in the Last Year: Never true    Ran Out of Food in the Last Year: Never true  Transportation Needs: No Transportation Needs (05/13/2020)   PRAPARE - Administrator, Civil Service (Medical): No    Lack of Transportation (Non-Medical): No  Physical Activity: Inactive (05/13/2020)   Exercise Vital Sign    Days of Exercise per Week: 0 days    Minutes of Exercise per Session: 0 min  Stress: No Stress Concern Present  (05/13/2020)   [employer] of Occupational Health - Occupational Stress Questionnaire    Feeling of Stress : Not at all  Social Connections: Socially Isolated (05/13/2020)   Social Connection and Isolation Panel [NHANES]    Frequency of Communication with Friends and Family: More than three times a week    Frequency of Social Gatherings with Friends and Family: More than three times a week    Attends Religious Services: Never    Database administrator or Organizations: No    Attends Banker Meetings: Never    Marital Status: Widowed  Intimate Partner Violence: Not At Risk (05/13/2020)   Humiliation, Afraid, Rape, and Kick questionnaire    Fear of Current or Ex-Partner: No    Emotionally Abused: No    Physically Abused: No    Sexually Abused: No   Family History  Problem Relation Age of Onset   Alzheimer's disease Sister    Cardiomyopathy Brother       VITAL SIGNS BP 132/74   Pulse 74   Temp (!) 97.4 F (36.3 C)   Resp 18   Ht 5\' 5"  (1.651 m)   Wt 105 lb 12.8 oz (48 kg)   SpO2 98%   BMI 17.61 kg/m   Outpatient Encounter Medications as of 04/18/2023  Medication Sig   Acetaminophen (TYLENOL ARTHRITIS PAIN PO) Take 650 mg by mouth 3 (three) times daily.   amiodarone (PACERONE) 100 MG tablet Take 100 mg by mouth daily.   [EXPIRED] amoxicillin-clavulanate (AUGMENTIN) 875-125 MG tablet Take 1 tablet by mouth 2 (two) times daily.   aspirin 81 MG chewable tablet Chew 1 tablet (81 mg total) by mouth daily.   Camphor-Menthol-Methyl Sal (SALONPAS) 3.05-27-08 % PTCH Apply 1 patch topically daily. At 9 am   DULoxetine (CYMBALTA) 30 MG capsule Take 30 mg by mouth daily.   feeding supplement (BOOST HIGH PROTEIN) LIQD Take 1 Container by mouth in the morning and at bedtime. 0.06 gram- 1 kcal/mL; amt:   [EXPIRED] guaiFENesin (MUCINEX) 600 MG 12 hr tablet Take 600 mg by mouth 2 (two) times daily.   NON FORMULARY Diet - Dysphagia 3   ondansetron (ZOFRAN-ODT) 4 MG  disintegrating tablet Take 4 mg by mouth every 8 (eight) hours as needed for nausea or vomiting.   senna-docusate (SENNA S) 8.6-50 MG tablet Take 1 tablet by mouth 2 (two) times daily.   No facility-administered encounter medications on file as of 04/18/2023.     SIGNIFICANT DIAGNOSTIC EXAMS  PREVIOUS  01-04-22: dexa scan: t score -4.184   NO NEW EXAMS   LABS REVIEWED; PREVIOUS   04-20-22: wbc 5.9; hgb 12.4; hct 39.8; mcv 97.5 plt 204; glucose 86; bun 14; creat 0.83; k+ 4.0; na++ 140; ca 8.4; gfr >60; protein 6.3; albumin 3.4;  tsh 3.839; iron 58; tibc 304; vitamin D 45.06    10-09-22: wbc 5.8; hgb 11.1; hct 35.4; mcv 100; plt 257; glucose 80; bun 21; creat 0.95; k+ 4.0; na++ 138; ca 8.5; gfr 57; protein 5.8; protein 3.1   01-11-23: d-dimer: 0.40 04-03-23: wbc 10.5; hgb 12.7; hct 39.6 plt 96.6 plt 292; glucose 124; bun 11; creat 0.82; k+ 3.3; na++ 137; ca 9.2; gfr >60; urine culture: e-coli: macrobid 04-09-23: k+ 3.9 04-10-23: wbc 7.4; hgb 14.0; hct 44.1; mcv 96.1; plt 256; glucose 126; bun 18; creat 0.97; k+ 3.6; na++ 134; ca 8.7; gfr 55   NO NEW LABS.     Review of Systems  Constitutional:  Negative for malaise/fatigue.  Respiratory:  Negative for cough and shortness of breath.   Cardiovascular:  Negative for chest pain, palpitations and leg swelling.  Gastrointestinal:  Negative for abdominal pain, constipation and heartburn.  Musculoskeletal:  Negative for back pain, joint pain and myalgias.  Skin: Negative.   Neurological:  Negative for dizziness.  Psychiatric/Behavioral:  The patient is not nervous/anxious.    Physical Exam Constitutional:      General: She is not in acute distress.    Appearance: She is well-developed. She is not diaphoretic.  Neck:     Thyroid: No thyromegaly.  Cardiovascular:     Rate and Rhythm: Normal rate and regular rhythm.     Pulses: Normal pulses.     Heart sounds: Normal heart sounds.  Pulmonary:     Effort: Pulmonary effort is normal. No  respiratory distress.     Breath sounds: Normal breath sounds.  Abdominal:     General: Bowel sounds are normal. There is no distension.     Palpations: Abdomen is soft.     Tenderness: There is no abdominal tenderness.  Musculoskeletal:        General: Normal range of motion.     Cervical back: Neck supple.     Right lower leg: No edema.     Left lower leg: No edema.  Lymphadenopathy:     Cervical: No cervical adenopathy.  Skin:    General: Skin is warm and dry.  Neurological:     Mental Status: She is alert. Mental status is at baseline.     Comments: 10-20-22: SLUMS 10/30   Psychiatric:        Mood and Affect: Mood normal.      ASSESSMENT/ PLAN:  TODAY  Atrial fibrillation, chronic Anemia due to stage 3 chronic kidney disease Severe protein calorie malnutrition  Will continue current medications Will continue current plan of care Will continue to monitor her status.   Time spent with patient: 40 minutes: medications; recovering from infections; dietary    Synthia Innocent NP [facility]  call [phone]

## 2023-04-18 ENCOUNTER — Non-Acute Institutional Stay (SKILLED_NURSING_FACILITY): Payer: Medicare Other | Admitting: Adult Health

## 2023-04-18 DIAGNOSIS — I482 Chronic atrial fibrillation, unspecified: Secondary | ICD-10-CM

## 2023-04-18 DIAGNOSIS — D631 Anemia in chronic kidney disease: Secondary | ICD-10-CM

## 2023-04-18 DIAGNOSIS — N1832 Chronic kidney disease, stage 3b: Secondary | ICD-10-CM | POA: Diagnosis not present

## 2023-04-18 DIAGNOSIS — E43 Unspecified severe protein-calorie malnutrition: Secondary | ICD-10-CM | POA: Diagnosis not present

## 2023-05-07 DIAGNOSIS — Z961 Presence of intraocular lens: Secondary | ICD-10-CM | POA: Diagnosis not present

## 2023-05-07 DIAGNOSIS — H26493 Other secondary cataract, bilateral: Secondary | ICD-10-CM | POA: Diagnosis not present

## 2023-05-07 DIAGNOSIS — H524 Presbyopia: Secondary | ICD-10-CM | POA: Diagnosis not present

## 2023-05-09 DIAGNOSIS — S9032XA Contusion of left foot, initial encounter: Secondary | ICD-10-CM | POA: Diagnosis not present

## 2023-05-09 DIAGNOSIS — S9002XA Contusion of left ankle, initial encounter: Secondary | ICD-10-CM | POA: Diagnosis not present

## 2023-05-14 ENCOUNTER — Non-Acute Institutional Stay (SKILLED_NURSING_FACILITY): Payer: Medicare Other | Admitting: Adult Health

## 2023-05-14 ENCOUNTER — Encounter: Payer: Self-pay | Admitting: Adult Health

## 2023-05-14 DIAGNOSIS — I482 Chronic atrial fibrillation, unspecified: Secondary | ICD-10-CM | POA: Diagnosis not present

## 2023-05-14 DIAGNOSIS — I10 Essential (primary) hypertension: Secondary | ICD-10-CM

## 2023-05-14 DIAGNOSIS — K219 Gastro-esophageal reflux disease without esophagitis: Secondary | ICD-10-CM | POA: Diagnosis not present

## 2023-05-14 DIAGNOSIS — I495 Sick sinus syndrome: Secondary | ICD-10-CM | POA: Diagnosis not present

## 2023-05-14 NOTE — Progress Notes (Signed)
Location:  [facility] Room Number: 118 Place of Service:  SNF (31)   CODE STATUS: dnr   Allergies  Allergen Reactions   Septra Ds [Sulfamethoxazole-Trimethoprim]    Lisinopril Other (See Comments)    Cough    Chief Complaint  Patient presents with   Medical Management of Chronic Issues        Atrial fibrillation/sick sinus syndrome: Primary hypertension: Gastroesophageal reflux disease without esophagitis     HPI:  She is a 89 year old long term resident of this facility being seen for the management of her chronic illnesses;  Atrial fibrillation/sick sinus syndrome: Primary hypertension: Gastroesophageal reflux disease without esophagitis. There are no reports of heart burn present. No reports of shortness of breath or palpitations.   Past Medical History:  Diagnosis Date   Acute metabolic encephalopathy 07/2021   Atrial fibrillation (HCC)    Bleeding stomach ulcer 1980s?   CVA (cerebral vascular accident) (HCC) 07/2021   DIVERTICULOSIS, COLON 11/22/2006   Headache(784.0)    "very often; not regular" (11/25/2015)   Heart block    hx of   Hip fracture (HCC) 07/2021   History of blood transfusion 1980s   "when I had the bleeding ulcer"   HYPERLIPIDEMIA 11/22/2006   Hypertension    Melanoma (HCC)    LLE   Menopausal syndrome (hot flashes)    OSTEOPOROSIS 11/22/2006   Presence of permanent cardiac pacemaker    Vitamin D deficiency 08/21/2021    Past Surgical History:  Procedure Laterality Date   APPENDECTOMY     BUNIONECTOMY Right    CATARACT EXTRACTION W/ INTRAOCULAR LENS  IMPLANT, BILATERAL Bilateral 2000s   CHOLECYSTECTOMY OPEN     EP IMPLANTABLE DEVICE N/A 11/25/2015   Procedure: Pacemaker Implant;  Surgeon: Will Jorja Loa, MD;  Location: [facility];  Service: Cardiovascular;  Laterality: N/A;   FEMUR IM NAIL Left 07/08/2014   Procedure: INTRAMEDULLARY (IM) NAIL ;  Surgeon: Cammy Copa, MD;  Location: [facility] ORS;   Service: Orthopedics;  Laterality: Left;   FRACTURE SURGERY     HIP ARTHROPLASTY Right 08/20/2021   Procedure: ARTHROPLASTY BIPOLAR HIP (HEMIARTHROPLASTY);  Surgeon: Myrene Galas, MD;  Location: [facility] OR;  Service: Orthopedics;  Laterality: Right;   IR KYPHO LUMBAR INC FX REDUCE BONE BX UNI/BIL CANNULATION INC/IMAGING  03/21/2021   PACEMAKER INSERTION     REVISION TOTAL HIP ARTHROPLASTY Left 06/2014    Social History   Socioeconomic History   Marital status: Widowed    Spouse name: Not on file   Number of children: 2   Years of education: 44   Highest education level: Not on file  Occupational History    Comment: retired  Tobacco Use   Smoking status: Never    Passive exposure: Never   Smokeless tobacco: Never  Vaping Use   Vaping status: Never Used  Substance and Sexual Activity   Alcohol use: No    Alcohol/week: 0.0 standard drinks of alcohol   Drug use: No   Sexual activity: Never  Other Topics Concern   Not on file  Social History Narrative   09/21/21 resides at [facility]      Lives alone, son lives beside her   Caffeine - none   Social Drivers of Health   Financial Resource Strain: Low Risk  (05/13/2020)   Overall Financial Resource Strain (CARDIA)    Difficulty of Paying Living Expenses: Not hard at all  Food Insecurity: No Food Insecurity (05/13/2020)  Hunger Vital Sign    Worried About Running Out of Food in the Last Year: Never true    Ran Out of Food in the Last Year: Never true  Transportation Needs: No Transportation Needs (05/13/2020)   PRAPARE - Administrator, Civil Service (Medical): No    Lack of Transportation (Non-Medical): No  Physical Activity: Inactive (05/13/2020)   Exercise Vital Sign    Days of Exercise per Week: 0 days    Minutes of Exercise per Session: 0 min  Stress: No Stress Concern Present (05/13/2020)   [employer] of Occupational Health - Occupational Stress Questionnaire    Feeling of Stress : Not at all  Social  Connections: Socially Isolated (05/13/2020)   Social Connection and Isolation Panel [NHANES]    Frequency of Communication with Friends and Family: More than three times a week    Frequency of Social Gatherings with Friends and Family: More than three times a week    Attends Religious Services: Never    Database administrator or Organizations: No    Attends Banker Meetings: Never    Marital Status: Widowed  Intimate Partner Violence: Not At Risk (05/13/2020)   Humiliation, Afraid, Rape, and Kick questionnaire    Fear of Current or Ex-Partner: No    Emotionally Abused: No    Physically Abused: No    Sexually Abused: No   Family History  Problem Relation Age of Onset   Alzheimer's disease Sister    Cardiomyopathy Brother       VITAL SIGNS BP 118/64   Pulse 74   Temp 98.4 F (36.9 C)   Resp 20   Ht 4\' 11"  (1.499 m)   Wt 107 lb 3.2 oz (48.6 kg)   SpO2 95%   BMI 21.65 kg/m   Outpatient Encounter Medications as of 05/14/2023  Medication Sig   Acetaminophen (TYLENOL ARTHRITIS PAIN PO) Take 650 mg by mouth 3 (three) times daily.   amiodarone (PACERONE) 100 MG tablet Take 100 mg by mouth daily.   aspirin 81 MG chewable tablet Chew 1 tablet (81 mg total) by mouth daily.   Camphor-Menthol-Methyl Sal (SALONPAS) 3.05-27-08 % PTCH Apply 1 patch topically daily. At 9 am   feeding supplement (BOOST HIGH PROTEIN) LIQD Take 1 Container by mouth in the morning and at bedtime. 0.06 gram- 1 kcal/mL; amt:   NON FORMULARY Diet - Dysphagia 3   ondansetron (ZOFRAN-ODT) 4 MG disintegrating tablet Take 4 mg by mouth every 8 (eight) hours as needed for nausea or vomiting.   senna-docusate (SENNA S) 8.6-50 MG tablet Take 1 tablet by mouth 2 (two) times daily.   [DISCONTINUED] DULoxetine (CYMBALTA) 30 MG capsule Take 30 mg by mouth daily.   No facility-administered encounter medications on file as of 05/14/2023.     SIGNIFICANT DIAGNOSTIC EXAMS  PREVIOUS  01-04-22: dexa scan:  t score -4.184   NO NEW EXAMS   LABS REVIEWED; PREVIOUS    10-09-22: wbc 5.8; hgb 11.1; hct 35.4; mcv 100; plt 257; glucose 80; bun 21; creat 0.95; k+ 4.0; na++ 138; ca 8.5; gfr 57; protein 5.8; protein 3.1   01-11-23: d-dimer: 0.40  TODAY  04-03-23: wbc 10.5; hgb 12.7; hct 39.6; mcv 96.6 plt 292; glucose 124; bun 11; creat 0.82; k+ 3.3; na++ 137; ca 9.2; gfr >60; urine culture: e-coli: augmentin  04-09-23: k+ 3.9 04-10-23: wbc 7.4; hgb 14.0; hct 44.1; mcv 96.1 plt 256; glucose 126; bun 18; creat 0.97; k+ 3.6; na++  134; ca 8.7; gfr 55     Review of Systems  Constitutional:  Negative for malaise/fatigue.  Respiratory:  Negative for cough and shortness of breath.   Cardiovascular:  Negative for chest pain, palpitations and leg swelling.  Gastrointestinal:  Negative for abdominal pain, constipation and heartburn.  Musculoskeletal:  Negative for back pain, joint pain and myalgias.  Skin: Negative.   Neurological:  Negative for dizziness.  Psychiatric/Behavioral:  The patient is not nervous/anxious.    Physical Exam Constitutional:      General: She is not in acute distress.    Appearance: She is well-developed. She is not diaphoretic.  Neck:     Thyroid: No thyromegaly.  Cardiovascular:     Rate and Rhythm: Normal rate and regular rhythm.     Pulses: Normal pulses.     Heart sounds: Normal heart sounds.  Pulmonary:     Effort: Pulmonary effort is normal. No respiratory distress.     Breath sounds: Normal breath sounds.  Abdominal:     General: Bowel sounds are normal. There is no distension.     Palpations: Abdomen is soft.     Tenderness: There is no abdominal tenderness.  Musculoskeletal:        General: Normal range of motion.     Cervical back: Neck supple.     Right lower leg: No edema.     Left lower leg: No edema.  Lymphadenopathy:     Cervical: No cervical adenopathy.  Skin:    General: Skin is warm and dry.  Neurological:     Mental Status: She is alert. Mental  status is at baseline.     Comments:  10-20-22: SLUMS 10/30    Psychiatric:        Mood and Affect: Mood normal.         ASSESSMENT/ PLAN:  TODAY  Atrial fibrillation/sick sinus syndrome: is status post pacemaker will continue amiodarone 100 mg for rate control and asa 81 mg daily   2. Primary hypertension: b/p 118/64  3. Gastroesophageal reflux disease without esophagitis: will monitor   PREVIOUS   4. Chronic kidney disease stage 3b: bun 18; creat 0.97; gfr 55  5. Anemia due to chronic kidney disease: hgb 14.0  6. Mixed hyperlipidemia: is off statin due to advanced age.   7. Chronic low back pain: will continue to monitor    8. Vitamin D deficiency: level 45.06  9. Vitamin B12: deficiency: level 204  10. Aortic atherosclerosis (ct 08-02-21) is on asa; off statin due to advanced age.   11. Post menopausal osteoporosis: t score -4.184 will monitor   12. Protein calorie malnutrition: albumin 3.1; will continue supplements as directed.   13. History of CVA (cerebrovascular accident) is on asa 81 mg daily     Synthia Innocent NP [facility]  call [phone]

## 2023-05-17 ENCOUNTER — Encounter: Payer: Self-pay | Admitting: Adult Health

## 2023-05-23 DIAGNOSIS — S9032XA Contusion of left foot, initial encounter: Secondary | ICD-10-CM | POA: Diagnosis not present

## 2023-05-23 DIAGNOSIS — S82832A Other fracture of upper and lower end of left fibula, initial encounter for closed fracture: Secondary | ICD-10-CM | POA: Diagnosis not present

## 2023-05-31 ENCOUNTER — Ambulatory Visit (INDEPENDENT_AMBULATORY_CARE_PROVIDER_SITE_OTHER): Payer: Medicare Other | Admitting: Orthopaedic Surgery

## 2023-05-31 ENCOUNTER — Other Ambulatory Visit (INDEPENDENT_AMBULATORY_CARE_PROVIDER_SITE_OTHER): Payer: Medicare Other

## 2023-05-31 ENCOUNTER — Encounter: Payer: Self-pay | Admitting: Orthopaedic Surgery

## 2023-05-31 VITALS — BP 154/74 | HR 63

## 2023-05-31 DIAGNOSIS — M25572 Pain in left ankle and joints of left foot: Secondary | ICD-10-CM

## 2023-05-31 DIAGNOSIS — S8265XA Nondisplaced fracture of lateral malleolus of left fibula, initial encounter for closed fracture: Secondary | ICD-10-CM | POA: Diagnosis not present

## 2023-05-31 DIAGNOSIS — S8262XA Displaced fracture of lateral malleolus of left fibula, initial encounter for closed fracture: Secondary | ICD-10-CM | POA: Diagnosis not present

## 2023-05-31 NOTE — Progress Notes (Signed)
 Subjective:    Patient ID: [name] Small, female    DOB: [birth year], 89 y.o.   MRN: [id number]  HPI She has had swelling of the left lateral ankle for a week or so.  She is a resident at [facility].  She brings no notes, no information with her.  Fortunately, she can give a good history.  She denies any trauma. She has had no X-rays she says.  She has been in an Ace bandage but the foot still hurts and swells.  She has bruising of the lateral ankle.  She has no other injury or complaints.   Review of Systems  Constitutional:  Positive for activity change.  Musculoskeletal:  Positive for arthralgias, gait problem, joint swelling and myalgias.  All other systems reviewed and are negative. For Review of Systems, all other systems reviewed and are negative.  The following is a summary of the past history medically, past history surgically, known current medicines, social history and family history.  This information is gathered electronically by the computer from prior information and documentation.  I review this each visit and have found including this information at this point in the chart is beneficial and informative.   Past Medical History:  Diagnosis Date   Acute metabolic encephalopathy 07/2021   Atrial fibrillation (HCC)    Bleeding stomach ulcer 1980s?   CVA (cerebral vascular accident) (HCC) 07/2021   DIVERTICULOSIS, COLON 11/22/2006   Headache(784.0)    very often; not regular (11/25/2015)   Heart block    hx of   Hip fracture (HCC) 07/2021   History of blood transfusion 1980s   when I had the bleeding ulcer   HYPERLIPIDEMIA 11/22/2006   Hypertension    Melanoma (HCC)    LLE   Menopausal syndrome (hot flashes)    OSTEOPOROSIS 11/22/2006   Presence of permanent cardiac pacemaker    Vitamin D  deficiency 08/21/2021    Past Surgical History:  Procedure Laterality Date   APPENDECTOMY     BUNIONECTOMY Right    CATARACT EXTRACTION W/ INTRAOCULAR LENS  IMPLANT,  BILATERAL Bilateral 2000s   CHOLECYSTECTOMY OPEN     EP IMPLANTABLE DEVICE N/A 11/25/2015   Procedure: Pacemaker Implant;  Surgeon: Will Gladis Norton, MD;  Location: [facility];  Service: Cardiovascular;  Laterality: N/A;   FEMUR IM NAIL Left 07/08/2014   Procedure: INTRAMEDULLARY (IM) NAIL ;  Surgeon: Cordella Glendia Hutchinson, MD;  Location: [facility] OR[facility];  Service: Orthopedics;  Laterality: Left;   FRACTURE SURGERY     HIP ARTHROPLASTY Right 08/20/2021   Procedure: ARTHROPLASTY BIPOLAR HIP (HEMIARTHROPLASTY);  Surgeon: Celena Sharper, MD;  Location: [facility] OR;  Service: Orthopedics;  Laterality: Right;   IR KYPHO LUMBAR INC FX REDUCE BONE BX UNI/BIL CANNULATION INC/IMAGING  03/21/2021   PACEMAKER INSERTION     REVISION TOTAL HIP ARTHROPLASTY Left 06/2014    Current Outpatient Medications on File Prior to Visit  Medication Sig Dispense Refill   Acetaminophen  (TYLENOL  ARTHRITIS PAIN PO) Take 650 mg by mouth 3 (three) times daily.     amiodarone  (PACERONE ) 100 MG tablet Take 100 mg by mouth daily.     aspirin  81 MG chewable tablet Chew 1 tablet (81 mg total) by mouth daily. 30 tablet 0   Camphor-Menthol -Methyl Sal (SALONPAS) 3.05-27-08 % PTCH Apply 1 patch topically daily. At 9 am     feeding supplement (BOOST HIGH PROTEIN) LIQD Take 1 Container by mouth in the morning and at bedtime. 0.06 gram- 1 kcal/mL;  amt:     NON FORMULARY Diet - Dysphagia 3     ondansetron  (ZOFRAN -ODT) 4 MG disintegrating tablet Take 4 mg by mouth every 8 (eight) hours as needed for nausea or vomiting.     senna-docusate (SENNA S) 8.6-50 MG tablet Take 1 tablet by mouth 2 (two) times daily.     No current facility-administered medications on file prior to visit.    Social History   Socioeconomic History   Marital status: Widowed    Spouse name: Not on file   Number of children: 2   Years of education: 57   Highest education level: Not on file  Occupational History    Comment: retired  Tobacco Use   Smoking  status: Never    Passive exposure: Never   Smokeless tobacco: Never  Vaping Use   Vaping status: Never Used  Substance and Sexual Activity   Alcohol  use: No    Alcohol /week: 0.0 standard drinks of alcohol    Drug use: No   Sexual activity: Never  Other Topics Concern   Not on file  Social History Narrative   09/21/21 resides at [facility]      Lives alone, son lives beside her   Caffeine - none   Social Drivers of Health   Financial Resource Strain: Low Risk  (05/13/2020)   Overall Financial Resource Strain (CARDIA)    Difficulty of Paying Living Expenses: Not hard at all  Food Insecurity: No Food Insecurity (05/13/2020)   Hunger Vital Sign    Worried About Running Out of Food in the Last Year: Never true    Ran Out of Food in the Last Year: Never true  Transportation Needs: No Transportation Needs (05/13/2020)   PRAPARE - Administrator, Civil Service (Medical): No    Lack of Transportation (Non-Medical): No  Physical Activity: Inactive (05/13/2020)   Exercise Vital Sign    Days of Exercise per Week: 0 days    Minutes of Exercise per Session: 0 min  Stress: No Stress Concern Present (05/13/2020)   [employer] of Occupational Health - Occupational Stress Questionnaire    Feeling of Stress : Not at all  Social Connections: Socially Isolated (05/13/2020)   Social Connection and Isolation Panel [NHANES]    Frequency of Communication with Friends and Family: More than three times a week    Frequency of Social Gatherings with Friends and Family: More than three times a week    Attends Religious Services: Never    Database Administrator or Organizations: No    Attends Banker Meetings: Never    Marital Status: Widowed  Intimate Partner Violence: Not At Risk (05/13/2020)   Humiliation, Afraid, Rape, and Kick questionnaire    Fear of Current or Ex-Partner: No    Emotionally Abused: No    Physically Abused: No    Sexually Abused: No    Family  History  Problem Relation Age of Onset   Alzheimer's disease Sister    Cardiomyopathy Brother     BP (!) 154/74   Pulse 63   There is no height or weight on file to calculate BMI.      Objective:   Physical Exam Vitals and nursing note reviewed. Exam conducted with a chaperone present.  Constitutional:      Appearance: She is well-developed.  HENT:     Head: Normocephalic and atraumatic.  Eyes:     Conjunctiva/sclera: Conjunctivae normal.     Pupils: Pupils are equal,  round, and reactive to light.  Cardiovascular:     Rate and Rhythm: Normal rate and regular rhythm.  Pulmonary:     Effort: Pulmonary effort is normal.  Abdominal:     Palpations: Abdomen is soft.  Musculoskeletal:     Cervical back: Normal range of motion and neck supple.       Feet:  Skin:    General: Skin is warm and dry.  Neurological:     Mental Status: She is alert and oriented to person, place, and time.     Cranial Nerves: No cranial nerve deficit.     Motor: No abnormal muscle tone.     Coordination: Coordination normal.     Deep Tendon Reflexes: Reflexes are normal and symmetric. Reflexes normal.  Psychiatric:        Behavior: Behavior normal.        Thought Content: Thought content normal.        Judgment: Judgment normal.   X-rays were done of the left ankle, reported separately.        Assessment & Plan:   Encounter Diagnoses  Name Primary?   Pain in left ankle and joints of left foot Yes   Nondisplaced fracture of lateral malleolus of left fibula, initial encounter for closed fracture    She is placed in [facility] walker, instructions given.  Forms for nursing home completed.    Return in two weeks.  Call if any problem.  Precautions discussed.  Electronically Signed Lemond Stable, MD 1/9/20258:54 AM

## 2023-06-05 ENCOUNTER — Telehealth: Payer: Self-pay | Admitting: Orthopaedic Surgery

## 2023-06-05 NOTE — Telephone Encounter (Signed)
 Dr. Sanjuan Dame pt - spoke w/Tammy at [facility] [phone], they need he pt's weight bearing status

## 2023-06-05 NOTE — Telephone Encounter (Signed)
 No weightbearing instructions on pt form to [facility]. Please advise.

## 2023-06-05 NOTE — Telephone Encounter (Signed)
 Dr. Sanjuan Dame pt - spoke w/Tammy at the [facility] [phone] again, she stated they really need her weight bearing status as she can't get out of the bed, go to PT or do anything until they know.

## 2023-06-06 NOTE — Telephone Encounter (Signed)
 I called the [name] and spoke w/Tammy and advised, she verbalized understanding

## 2023-06-08 ENCOUNTER — Non-Acute Institutional Stay (SKILLED_NURSING_FACILITY): Payer: Medicare Other | Admitting: Adult Health

## 2023-06-08 ENCOUNTER — Encounter: Payer: Self-pay | Admitting: Adult Health

## 2023-06-08 DIAGNOSIS — D631 Anemia in chronic kidney disease: Secondary | ICD-10-CM | POA: Diagnosis not present

## 2023-06-08 DIAGNOSIS — N1832 Chronic kidney disease, stage 3b: Secondary | ICD-10-CM | POA: Diagnosis not present

## 2023-06-08 DIAGNOSIS — I700 Atherosclerosis of aorta: Secondary | ICD-10-CM

## 2023-06-08 DIAGNOSIS — I495 Sick sinus syndrome: Secondary | ICD-10-CM | POA: Diagnosis not present

## 2023-06-08 NOTE — Progress Notes (Signed)
Location:  [facility] Room Number: 118 Place of Service:  SNF (31)   CODE STATUS: dnr   Allergies  Allergen Reactions   Septra Ds [Sulfamethoxazole-Trimethoprim]    Lisinopril Other (See Comments)    Cough    Chief Complaint  Patient presents with   Acute Visit    Care plan meeting.     HPI:  We have come together for her care plan meeting. Family present. BIMS 13/15 mood 6/30: decreased energy, nervous. She is wheelchair bound without falls. She did twist her left foot while in the bathroom. She requires moderate to maximum assist with her adls. She is frequently incontinent of bladder and frequently incontinent of bowel. Dietary: requires setup for meals. Regular diet appetite 50-100%; weight is 100.8 pounds. Therapy: none at this time. She will continues to be followed for her chronic illnesses:   Aortic atherosclerosis    Sick sinus syndrome   Anemia due to stage 3b chronic kidney disease   Past Medical History:  Diagnosis Date   Acute metabolic encephalopathy 07/2021   Atrial fibrillation (HCC)    Bleeding stomach ulcer 1980s?   CVA (cerebral vascular accident) (HCC) 07/2021   DIVERTICULOSIS, COLON 11/22/2006   Headache(784.0)    "very often; not regular" (11/25/2015)   Heart block    hx of   Hip fracture (HCC) 07/2021   History of blood transfusion 1980s   "when I had the bleeding ulcer"   HYPERLIPIDEMIA 11/22/2006   Hypertension    Melanoma (HCC)    LLE   Menopausal syndrome (hot flashes)    OSTEOPOROSIS 11/22/2006   Presence of permanent cardiac pacemaker    Vitamin D deficiency 08/21/2021    Past Surgical History:  Procedure Laterality Date   APPENDECTOMY     BUNIONECTOMY Right    CATARACT EXTRACTION W/ INTRAOCULAR LENS  IMPLANT, BILATERAL Bilateral 2000s   CHOLECYSTECTOMY OPEN     EP IMPLANTABLE DEVICE N/A 11/25/2015   Procedure: Pacemaker Implant;  Surgeon: Will Jorja Loa, MD;  [facility]: [facility];  Service:  Cardiovascular;  Laterality: N/A;   FEMUR IM NAIL Left 07/08/2014   Procedure: INTRAMEDULLARY (IM) NAIL ;  Surgeon: Cammy Copa, MD;  Location: [facility] ORS;  Service: Orthopedics;  Laterality: Left;   FRACTURE SURGERY     HIP ARTHROPLASTY Right 08/20/2021   Procedure: ARTHROPLASTY BIPOLAR HIP (HEMIARTHROPLASTY);  Surgeon: Myrene Galas, MD;  Location: [facility];  Service: Orthopedics;  Laterality: Right;   IR KYPHO LUMBAR INC FX REDUCE BONE BX UNI/BIL CANNULATION INC/IMAGING  03/21/2021   PACEMAKER INSERTION     REVISION TOTAL HIP ARTHROPLASTY Left 06/2014    Social History   Socioeconomic History   Marital status: Widowed    Spouse name: Not on file   Number of children: 2   Years of education: 52   Highest education level: Not on file  Occupational History    Comment: retired  Tobacco Use   Smoking status: Never    Passive exposure: Never   Smokeless tobacco: Never  Vaping Use   Vaping status: Never Used  Substance and Sexual Activity   Alcohol use: No    Alcohol/week: 0.0 standard drinks of alcohol   Drug use: No   Sexual activity: Never  Other Topics Concern   Not on file  Social History Narrative   09/21/21 resides at [facility]      Lives alone, son lives beside her   Caffeine - none   Social  Drivers of Health   Financial Resource Strain: Low Risk  (05/13/2020)   Overall Financial Resource Strain (CARDIA)    Difficulty of Paying Living Expenses: Not hard at all  Food Insecurity: No Food Insecurity (05/13/2020)   Hunger Vital Sign    Worried About Running Out of Food in the Last Year: Never true    Ran Out of Food in the Last Year: Never true  Transportation Needs: No Transportation Needs (05/13/2020)   PRAPARE - Administrator, Civil Service (Medical): No    Lack of Transportation (Non-Medical): No  Physical Activity: Inactive (05/13/2020)   Exercise Vital Sign    Days of Exercise per Week: 0 days    Minutes of Exercise per Session: 0 min  Stress:  No Stress Concern Present (05/13/2020)   [employer] of Occupational Health - Occupational Stress Questionnaire    Feeling of Stress : Not at all  Social Connections: Socially Isolated (05/13/2020)   Social Connection and Isolation Panel [NHANES]    Frequency of Communication with Friends and Family: More than three times a week    Frequency of Social Gatherings with Friends and Family: More than three times a week    Attends Religious Services: Never    Database administrator or Organizations: No    Attends Banker Meetings: Never    Marital Status: Widowed  Intimate Partner Violence: Not At Risk (05/13/2020)   Humiliation, Afraid, Rape, and Kick questionnaire    Fear of Current or Ex-Partner: No    Emotionally Abused: No    Physically Abused: No    Sexually Abused: No   Family History  Problem Relation Age of Onset   Alzheimer's disease Sister    Cardiomyopathy Brother       VITAL SIGNS BP 138/78   Pulse (!) 50   Temp (!) 97.5 F (36.4 C)   Resp 20   Ht 4\' 11"  (1.499 m)   Wt 110 lb 12.8 oz (50.3 kg)   SpO2 100%   BMI 22.38 kg/m   Outpatient Encounter Medications as of 06/08/2023  Medication Sig   Acetaminophen (TYLENOL ARTHRITIS PAIN PO) Take 650 mg by mouth 3 (three) times daily.   amiodarone (PACERONE) 100 MG tablet Take 100 mg by mouth daily.   aspirin 81 MG chewable tablet Chew 1 tablet (81 mg total) by mouth daily.   Camphor-Menthol-Methyl Sal (SALONPAS) 3.05-27-08 % PTCH Apply 1 patch topically daily. At 9 am   feeding supplement (BOOST HIGH PROTEIN) LIQD Take 1 Container by mouth in the morning and at bedtime. 0.06 gram- 1 kcal/mL; amt:   NON FORMULARY Diet - Dysphagia 3   ondansetron (ZOFRAN-ODT) 4 MG disintegrating tablet Take 4 mg by mouth every 8 (eight) hours as needed for nausea or vomiting.   senna-docusate (SENNA S) 8.6-50 MG tablet Take 1 tablet by mouth 2 (two) times daily.   No facility-administered encounter medications on  file as of 06/08/2023.     SIGNIFICANT DIAGNOSTIC EXAMS  01-04-22: dexa scan: t score -4.184   NO NEW EXAMS   LABS REVIEWED; PREVIOUS    10-09-22: wbc 5.8; hgb 11.1; hct 35.4; mcv 100; plt 257; glucose 80; bun 21; creat 0.95; k+ 4.0; na++ 138; ca 8.5; gfr 57; protein 5.8; protein 3.1   01-11-23: d-dimer: 0.40 04-03-23: wbc 10.5; hgb 12.7; hct 39.6; mcv 96.6 plt 292; glucose 124; bun 11; creat 0.82; k+ 3.3; na++ 137; ca 9.2; gfr >60; urine culture: e-coli: augmentin  04-09-23: k+ 3.9 04-10-23: wbc 7.4; hgb 14.0; hct 44.1; mcv 96.1 plt 256; glucose 126; bun 18; creat 0.97; k+ 3.6; na++ 134; ca 8.7; gfr 55   NO NEW LABS.     Review of Systems  Constitutional:  Negative for malaise/fatigue.  Respiratory:  Negative for cough and shortness of breath.   Cardiovascular:  Negative for chest pain, palpitations and leg swelling.  Gastrointestinal:  Negative for abdominal pain, constipation and heartburn.  Musculoskeletal:  Negative for back pain, joint pain and myalgias.  Skin: Negative.   Neurological:  Negative for dizziness.  Psychiatric/Behavioral:  The patient is not nervous/anxious.    Physical Exam Constitutional:      General: She is not in acute distress.    Appearance: She is well-developed. She is not diaphoretic.  Neck:     Thyroid: No thyromegaly.  Cardiovascular:     Rate and Rhythm: Normal rate and regular rhythm.     Pulses: Normal pulses.     Heart sounds: Normal heart sounds.  Pulmonary:     Effort: Pulmonary effort is normal. No respiratory distress.     Breath sounds: Normal breath sounds.  Abdominal:     General: Bowel sounds are normal. There is no distension.     Palpations: Abdomen is soft.     Tenderness: There is no abdominal tenderness.  Musculoskeletal:        General: Normal range of motion.     Cervical back: Neck supple.     Right lower leg: No edema.     Left lower leg: No edema.     Comments: Left foot in CAM boot   Lymphadenopathy:      Cervical: No cervical adenopathy.  Skin:    General: Skin is warm and dry.  Neurological:     Mental Status: She is alert. Mental status is at baseline.     Comments: 10-20-22: SLUMS 10/30  Psychiatric:        Mood and Affect: Mood normal.      ASSESSMENT/ PLAN:  TODAY  Aortic atherosclerosis Sick sinus syndrome Anemia due to stage 3b chronic kidney disease  Will continue current medications Will continue current plan of care Will continue to monitor her status  Time spent with patient: 40 minutes: medications; dietary; health status.   Synthia Innocent NP [facility]  call [phone]

## 2023-06-14 ENCOUNTER — Ambulatory Visit: Payer: Medicare Other | Admitting: Orthopaedic Surgery

## 2023-06-14 ENCOUNTER — Encounter: Payer: Self-pay | Admitting: Orthopaedic Surgery

## 2023-06-14 ENCOUNTER — Telehealth: Payer: Self-pay | Admitting: Orthopaedic Surgery

## 2023-06-14 ENCOUNTER — Other Ambulatory Visit (INDEPENDENT_AMBULATORY_CARE_PROVIDER_SITE_OTHER): Payer: Medicare Other

## 2023-06-14 DIAGNOSIS — S8265XD Nondisplaced fracture of lateral malleolus of left fibula, subsequent encounter for closed fracture with routine healing: Secondary | ICD-10-CM

## 2023-06-14 DIAGNOSIS — S8265XA Nondisplaced fracture of lateral malleolus of left fibula, initial encounter for closed fracture: Secondary | ICD-10-CM

## 2023-06-14 NOTE — Telephone Encounter (Signed)
Yes, it's in the scan basket up front.  I can't really make out what it says, but it looks like it says continue CAM Walker???

## 2023-06-14 NOTE — Telephone Encounter (Signed)
His written note says healing fracture wear cam walker but he doesn't advise on WB status will ask him to clarify   Dr Kirtland Bouchard what is WB status?

## 2023-06-14 NOTE — Telephone Encounter (Signed)
Dr. Sanjuan Dame pt - [name] Small from the [facility] [phone] lvm stating they need the pt's weight bearing status.

## 2023-06-14 NOTE — Telephone Encounter (Signed)
It is not in his note, did we keep copy of what he sent to the nursing home

## 2023-06-14 NOTE — Progress Notes (Signed)
I feel OK.  She has tenderness of the left ankle but no new trauma.  She has been using the CAM walker.  X-rays were done of the left ankle, reported separately.  NV intact. She has resolving ecchymosis of the left ankle.  Encounter Diagnosis  Name Primary?   Nondisplaced fracture of lateral malleolus of left fibula, subsequent encounter for closed fracture with routine healing Yes   Return in three weeks.  Xray the left ankle then out of the CAM walker.  Forms completed for the nursing home.  Call if any problem.  Precautions discussed.  Electronically Signed Darreld Mclean, MD 1/23/202510:10 AM

## 2023-06-18 ENCOUNTER — Non-Acute Institutional Stay (SKILLED_NURSING_FACILITY): Payer: Medicare Other | Admitting: Adult Health

## 2023-06-18 DIAGNOSIS — D631 Anemia in chronic kidney disease: Secondary | ICD-10-CM | POA: Diagnosis not present

## 2023-06-18 DIAGNOSIS — E782 Mixed hyperlipidemia: Secondary | ICD-10-CM | POA: Diagnosis not present

## 2023-06-18 DIAGNOSIS — N1832 Chronic kidney disease, stage 3b: Secondary | ICD-10-CM

## 2023-06-18 NOTE — Telephone Encounter (Signed)
Called the number provided by Victorino Dike at [facility] and no answer

## 2023-06-21 ENCOUNTER — Encounter: Payer: Self-pay | Admitting: Internal Medicine

## 2023-06-21 ENCOUNTER — Non-Acute Institutional Stay (SKILLED_NURSING_FACILITY): Payer: Medicare Other | Admitting: Internal Medicine

## 2023-06-21 DIAGNOSIS — I482 Chronic atrial fibrillation, unspecified: Secondary | ICD-10-CM | POA: Diagnosis not present

## 2023-06-21 DIAGNOSIS — I10 Essential (primary) hypertension: Secondary | ICD-10-CM | POA: Diagnosis not present

## 2023-06-21 DIAGNOSIS — Z95811 Presence of heart assist device: Secondary | ICD-10-CM | POA: Diagnosis not present

## 2023-06-21 DIAGNOSIS — R4189 Other symptoms and signs involving cognitive functions and awareness: Secondary | ICD-10-CM

## 2023-06-21 DIAGNOSIS — R29818 Other symptoms and signs involving the nervous system: Secondary | ICD-10-CM | POA: Diagnosis not present

## 2023-06-21 DIAGNOSIS — N1832 Chronic kidney disease, stage 3b: Secondary | ICD-10-CM

## 2023-06-21 NOTE — Assessment & Plan Note (Signed)
Pacemaker present; rate appears to be paced as rhythm is regular.

## 2023-06-21 NOTE — Progress Notes (Signed)
NURSING HOME LOCATION:  [facility] ROOM NUMBER:  [id number]  CODE STATUS:  DNR  PCP:  Synthia Innocent NP  This is a nursing facility follow up visit of chronic medical diagnoses & to document compliance with Regulation 483.30 (c) in The Long Term Care Survey Manual Phase 2 which mandates caregiver visit ( visits can alternate among physician, PA or NP as per statutes) within 10 days of 30 days / 60 days/ 90 days post admission to SNF date    Interim medical record and care since last SNF visit was updated with review of diagnostic studies and change in clinical status since last visit were documented.  HPI: She is a permanent resident of this facility with diagnoses of chronic atrial fibrillation, history of prior gastric ulcer, history of stroke, history of diverticulosis, dyslipidemia, essential hypertension, history of melanoma, vitamin D deficiency, and osteoporosis.  Significant surgeries or procedures include placement of a permanent cardiac pacemaker. She saw Dr. Darreld Mclean 06/14/2023 for follow-up of nondisplaced fracture of the lateral malleolus of the left fibula.  She has tenderness of the left ankle; she has been using a CAM walker.  She is to return in 3 weeks for repeat imaging with consideration for discontinuing the CAM walker at that time. Labs were most recently performed 04/13/2023.  Mild hyponatremia was present value 134 and mild hyperglycemia of 126.  She developed mild hypocalcemia with a value of 8.7.  CKD high stage IIIa is present with a creatinine of 0.97 and GFR 55, down from prior GFR values of greater than 60. She is on amiodarone; the most recent TSH was therapeutic at 3.081 on 10/09/2022.  Review of systems: Dementia invalidated responses.  She could not identify Dr. Hilda Lias & thought his office was in [place], not [place].  She actually gave my name as her Orthopedist .  She states that she is doing "ok."  She describes the ankle issue as  "aggravating"; she stated that she would have to wear the boot "2 more weeks."  She validates some anxiety and depression.  Constitutional: No fever, significant weight change, fatigue  Eyes: No redness, discharge, pain, vision change ENT/mouth: No nasal congestion,  purulent discharge, earache, change in hearing, sore throat  Cardiovascular: No chest pain, palpitations, paroxysmal nocturnal dyspnea, edema  Respiratory: No cough, sputum production, hemoptysis, DOE, significant snoring, apnea   Gastrointestinal: No heartburn, dysphagia, abdominal pain, nausea /vomiting, rectal bleeding, melena, change in bowels Genitourinary: No dysuria, hematuria, pyuria, incontinence, nocturia Dermatologic: No rash, pruritus, change in appearance of skin Neurologic: No dizziness, headache, syncope, seizures, numbness, tingling Endocrine: No change in hair/skin/nails, excessive thirst, excessive hunger, excessive urination  Hematologic/lymphatic: No significant bruising, lymphadenopathy, abnormal bleeding Allergy/immunology: No itchy/watery eyes, significant sneezing, urticaria, angioedema  Physical exam:  Pertinent or positive findings: She appears her age , chronically ill, & suboptimally nourished.  Hair is disheveled.  Pacer is present.  There is increase in the first and second heart sounds.  Rhythm is regular and rate relatively slow.  Pedal pulses are not palpable on the right.CAM boot present on the LLE.  Interosseous wasting is present.  She has limb atrophy.  She exhibits coarse flexion contracture tremor in the hands.  There is a keratotic lesion over the right temple.  General appearance:  no acute distress, increased work of breathing is present.   Lymphatic: No lymphadenopathy about the head, neck, axilla. Eyes: No conjunctival inflammation or lid edema is present. There is no scleral icterus. Ears:  External ear exam shows no significant lesions or deformities.   Nose:  External nasal examination  shows no deformity or inflammation. Nasal mucosa are pink and moist without lesions, exudates Oral exam:  Lips and gums are healthy appearing. There is no oropharyngeal erythema or exudate. Neck:  No thyromegaly, masses, tenderness noted.    Heart:  No gallop, murmur, click, rub .  Lungs: Chest clear to auscultation without wheezes, rhonchi, rales, rubs. Abdomen: Bowel sounds are normal. Abdomen is soft and nontender with no organomegaly, hernias, masses. GU: Deferred  Extremities:  No cyanosis, clubbing, edema  Neurologic exam :Balance, Rhomberg, finger to nose testing could not be completed due to clinical state Skin: Warm & dry w/o tenting. No significant rash.  See summary under each active problem in the Problem List with associated updated therapeutic plan

## 2023-06-21 NOTE — Progress Notes (Signed)
Location:  [facility] Room Number: 118 Place of Service:  SNF (31)   CODE STATUS: dnr   Allergies  Allergen Reactions   Septra Ds [Sulfamethoxazole-Trimethoprim]    Lisinopril Other (See Comments)    Cough    Chief Complaint  Patient presents with   Medical Management of Chronic Issues       Chronic kidney disease stage 3b:   Anemia due to chronic kidney disease: Mixed hyperlipidemia:     HPI:  She is a 89 year old long term resident of this facility being seen for the management of her chronic illnesses: Chronic kidney disease stage 3b:   Anemia due to chronic kidney disease: Mixed hyperlipidemia. There are no reports of unmanaged pain. Her weight is without significant change. No reports of anxiety or depressive thoughts.   Past Medical History:  Diagnosis Date   Acute metabolic encephalopathy 07/2021   Atrial fibrillation (HCC)    Bleeding stomach ulcer 1980s?   CVA (cerebral vascular accident) (HCC) 07/2021   DIVERTICULOSIS, COLON 11/22/2006   Headache(784.0)    "very often; not regular" (11/25/2015)   Heart block    hx of   Hip fracture (HCC) 07/2021   History of blood transfusion 1980s   "when I had the bleeding ulcer"   HYPERLIPIDEMIA 11/22/2006   Hypertension    Melanoma (HCC)    LLE   Menopausal syndrome (hot flashes)    OSTEOPOROSIS 11/22/2006   Presence of permanent cardiac pacemaker    Vitamin D deficiency 08/21/2021    Past Surgical History:  Procedure Laterality Date   APPENDECTOMY     BUNIONECTOMY Right    CATARACT EXTRACTION W/ INTRAOCULAR LENS  IMPLANT, BILATERAL Bilateral 2000s   CHOLECYSTECTOMY OPEN     EP IMPLANTABLE DEVICE N/A 11/25/2015   Procedure: Pacemaker Implant;  Surgeon: Will Jorja Loa, MD;  Location: [facility] INVASIVE CV LAB;  Service: Cardiovascular;  Laterality: N/A;   FEMUR IM NAIL Left 07/08/2014   Procedure: INTRAMEDULLARY (IM) NAIL ;  Surgeon: Cammy Copa, MD;  Location: [facility] ORS;  Service:  Orthopedics;  Laterality: Left;   FRACTURE SURGERY     HIP ARTHROPLASTY Right 08/20/2021   Procedure: ARTHROPLASTY BIPOLAR HIP (HEMIARTHROPLASTY);  Surgeon: Myrene Galas, MD;  Location: [facility] OR;  Service: Orthopedics;  Laterality: Right;   IR KYPHO LUMBAR INC FX REDUCE BONE BX UNI/BIL CANNULATION INC/IMAGING  03/21/2021   PACEMAKER INSERTION     REVISION TOTAL HIP ARTHROPLASTY Left 06/2014    Social History   Socioeconomic History   Marital status: Widowed    Spouse name: Not on file   Number of children: 2   Years of education: 57   Highest education level: Not on file  Occupational History    Comment: retired  Tobacco Use   Smoking status: Never    Passive exposure: Never   Smokeless tobacco: Never  Vaping Use   Vaping status: Never Used  Substance and Sexual Activity   Alcohol use: No    Alcohol/week: 0.0 standard drinks of alcohol   Drug use: No   Sexual activity: Never  Other Topics Concern   Not on file  Social History Narrative   09/21/21 resides at [facility]      Lives alone, son lives beside her   Caffeine - none   Social Drivers of Health   Financial Resource Strain: Low Risk  (05/13/2020)   Overall Financial Resource Strain (CARDIA)    Difficulty of Paying Living Expenses: Not  hard at all  Food Insecurity: No Food Insecurity (05/13/2020)   Hunger Vital Sign    Worried About Running Out of Food in the Last Year: Never true    Ran Out of Food in the Last Year: Never true  Transportation Needs: No Transportation Needs (05/13/2020)   PRAPARE - Administrator, Civil Service (Medical): No    Lack of Transportation (Non-Medical): No  Physical Activity: Inactive (05/13/2020)   Exercise Vital Sign    Days of Exercise per Week: 0 days    Minutes of Exercise per Session: 0 min  Stress: No Stress Concern Present (05/13/2020)   [employer] of Occupational Health - Occupational Stress Questionnaire    Feeling of Stress : Not at all  Social  Connections: Socially Isolated (05/13/2020)   Social Connection and Isolation Panel [NHANES]    Frequency of Communication with Friends and Family: More than three times a week    Frequency of Social Gatherings with Friends and Family: More than three times a week    Attends Religious Services: Never    Database administrator or Organizations: No    Attends Banker Meetings: Never    Marital Status: Widowed  Intimate Partner Violence: Not At Risk (05/13/2020)   Humiliation, Afraid, Rape, and Kick questionnaire    Fear of Current or Ex-Partner: No    Emotionally Abused: No    Physically Abused: No    Sexually Abused: No   Family History  Problem Relation Age of Onset   Alzheimer's disease Sister    Cardiomyopathy Brother       VITAL SIGNS BP 138/85   Pulse 70   Temp 97.6 F (36.4 C)   Resp 18   Ht 5\' 6"  (1.676 m)   Wt 111 lb 6.4 oz (50.5 kg)   SpO2 98%   BMI 17.98 kg/m   Outpatient Encounter Medications as of 06/18/2023  Medication Sig   Acetaminophen (TYLENOL ARTHRITIS PAIN PO) Take 650 mg by mouth 3 (three) times daily.   amiodarone (PACERONE) 100 MG tablet Take 100 mg by mouth daily.   aspirin 81 MG chewable tablet Chew 1 tablet (81 mg total) by mouth daily.   Camphor-Menthol-Methyl Sal (SALONPAS) 3.05-27-08 % PTCH Apply 1 patch topically daily. At 9 am   feeding supplement (BOOST HIGH PROTEIN) LIQD Take 1 Container by mouth in the morning and at bedtime. 0.06 gram- 1 kcal/mL; amt:   NON FORMULARY Diet - Dysphagia 3   ondansetron (ZOFRAN-ODT) 4 MG disintegrating tablet Take 4 mg by mouth every 8 (eight) hours as needed for nausea or vomiting.   senna-docusate (SENNA S) 8.6-50 MG tablet Take 1 tablet by mouth 2 (two) times daily.   No facility-administered encounter medications on file as of 06/18/2023.     SIGNIFICANT DIAGNOSTIC EXAMS  PREVIOUS  01-04-22: dexa scan: t score -4.184   NO NEW EXAMS   LABS REVIEWED; PREVIOUS    10-09-22: wbc 5.8;  hgb 11.1; hct 35.4; mcv 100; plt 257; glucose 80; bun 21; creat 0.95; k+ 4.0; na++ 138; ca 8.5; gfr 57; protein 5.8; protein 3.1   01-11-23: d-dimer: 0.40 04-03-23: wbc 10.5; hgb 12.7; hct 39.6; mcv 96.6 plt 292; glucose 124; bun 11; creat 0.82; k+ 3.3; na++ 137; ca 9.2; gfr >60; urine culture: e-coli: augmentin  04-09-23: k+ 3.9 04-10-23: wbc 7.4; hgb 14.0; hct 44.1; mcv 96.1 plt 256; glucose 126; bun 18; creat 0.97; k+ 3.6; na++ 134; ca 8.7; gfr 55  NO NEW LABS      Review of Systems  Constitutional:  Negative for malaise/fatigue.  Respiratory:  Negative for cough and shortness of breath.   Cardiovascular:  Negative for chest pain, palpitations and leg swelling.  Gastrointestinal:  Negative for abdominal pain, constipation and heartburn.  Musculoskeletal:  Negative for back pain, joint pain and myalgias.  Skin: Negative.   Neurological:  Negative for dizziness.  Psychiatric/Behavioral:  The patient is not nervous/anxious.    Physical Exam Constitutional:      General: She is not in acute distress.    Appearance: She is well-developed. She is not diaphoretic.  Neck:     Thyroid: No thyromegaly.  Cardiovascular:     Rate and Rhythm: Normal rate and regular rhythm.     Pulses: Normal pulses.     Heart sounds: Normal heart sounds.  Pulmonary:     Effort: Pulmonary effort is normal. No respiratory distress.     Breath sounds: Normal breath sounds.  Abdominal:     General: Bowel sounds are normal. There is no distension.     Palpations: Abdomen is soft.     Tenderness: There is no abdominal tenderness.  Musculoskeletal:        General: Normal range of motion.     Cervical back: Neck supple.     Right lower leg: No edema.     Left lower leg: No edema.  Lymphadenopathy:     Cervical: No cervical adenopathy.  Skin:    General: Skin is warm and dry.  Neurological:     Mental Status: She is alert. Mental status is at baseline.     Comments: SLUMS 10/30  Psychiatric:        Mood  and Affect: Mood normal.            ASSESSMENT/ PLAN:  TODAY  Chronic kidney disease stage 3b: bun 18; creat 0.97; gfr 55  2. Anemia due to chronic kidney disease: hgb 14.0  3. Mixed hyperlipidemia: off statin due to advanced age.   PREVIOUS   4. Chronic low back pain: will continue to monitor    5. Vitamin D deficiency: level 45.06  6. Vitamin B12: deficiency: level 204  7. Aortic atherosclerosis (ct 08-02-21) is on asa; off statin due to advanced age.   8. Post menopausal osteoporosis: t score -4.184 will monitor   9. Protein calorie malnutrition: albumin 3.1; will continue supplements as directed.   10. History of CVA (cerebrovascular accident) is on asa 81 mg daily   11. Atrial fibrillation/sick sinus syndrome: is status post pacemaker will continue amiodarone 100 mg for rate control and asa 81 mg daily   12. Primary hypertension: b/p 138/85  13. Gastroesophageal reflux disease without esophagitis: will monitor     Synthia Innocent NP [facility] Adult Medicine  call [phone]

## 2023-06-21 NOTE — Patient Instructions (Signed)
See assessment and plan under each diagnosis in the problem list and acutely for this visit

## 2023-06-21 NOTE — Assessment & Plan Note (Signed)
Renal function is CKD high stage IIIa with a creatinine of 0.97 and GFR 55, down from GFR greater than 60.  Med list reviewed; no indication for change in meds or dosage at this time.

## 2023-06-21 NOTE — Assessment & Plan Note (Addendum)
Today she cannot give me the name of the Orthopedic specialist he saw &  in fact gave my name. She also thought that he was in [place], not here in [place]. She does describe some anxiety and depression.  If this is progressive & clinically significant; low dose antidepressant will be initiated.

## 2023-06-21 NOTE — Assessment & Plan Note (Signed)
Today's blood pressure is an outlier.  On recheck blood pressure was 138/75.  Continue to monitor on present management.

## 2023-06-21 NOTE — Assessment & Plan Note (Addendum)
Clinically the rhythm is paced and regular. She is on amiodarone; the TSH was therapeutic in May 2024.  This will need to be monitored at least annually or as clinically indicated.

## 2023-07-05 ENCOUNTER — Other Ambulatory Visit (INDEPENDENT_AMBULATORY_CARE_PROVIDER_SITE_OTHER): Payer: Medicare Other

## 2023-07-05 ENCOUNTER — Ambulatory Visit: Payer: Medicare Other | Admitting: Orthopaedic Surgery

## 2023-07-05 ENCOUNTER — Encounter: Payer: Self-pay | Admitting: Orthopaedic Surgery

## 2023-07-05 DIAGNOSIS — S8265XD Nondisplaced fracture of lateral malleolus of left fibula, subsequent encounter for closed fracture with routine healing: Secondary | ICD-10-CM | POA: Diagnosis not present

## 2023-07-05 NOTE — Progress Notes (Signed)
She is doing well with the CAM walker.  X-rays were done of the left ankle today, reported separately.  Fracture is healing well. NV intact.  Encounter Diagnosis  Name Primary?   Nondisplaced fracture of lateral malleolus of left fibula, subsequent encounter for closed fracture with routine healing Yes   Come out of the CAM walker at night.  Wear CAM walker during the day.  Forms completed for nursing home.  Return in two weeks.  Call if any problem.  Precautions discussed.  Electronically Signed Darreld Mclean, MD 2/13/20259:51 AM

## 2023-07-06 DIAGNOSIS — I739 Peripheral vascular disease, unspecified: Secondary | ICD-10-CM | POA: Diagnosis not present

## 2023-07-06 DIAGNOSIS — L602 Onychogryphosis: Secondary | ICD-10-CM | POA: Diagnosis not present

## 2023-07-06 DIAGNOSIS — L603 Nail dystrophy: Secondary | ICD-10-CM | POA: Diagnosis not present

## 2023-07-19 ENCOUNTER — Encounter: Payer: Self-pay | Admitting: Orthopaedic Surgery

## 2023-07-19 ENCOUNTER — Ambulatory Visit: Payer: Medicare Other | Admitting: Orthopaedic Surgery

## 2023-07-19 ENCOUNTER — Other Ambulatory Visit (INDEPENDENT_AMBULATORY_CARE_PROVIDER_SITE_OTHER): Payer: Self-pay

## 2023-07-19 DIAGNOSIS — S8265XD Nondisplaced fracture of lateral malleolus of left fibula, subsequent encounter for closed fracture with routine healing: Secondary | ICD-10-CM

## 2023-07-19 NOTE — Progress Notes (Signed)
 I feel good.  Her left ankle is doing well. She has been using the CAM walker.    NV intact. ROM is good.  X-rays were done of the left ankle, reported separately.  Encounter Diagnosis  Name Primary?   Nondisplaced fracture of lateral malleolus of left fibula, subsequent encounter for closed fracture with routine healing Yes   I will see her as needed.  Stop the CAM walker.  Resume normal activities.  Forms completed for the nursing home.  Call if any problem.  Precautions discussed.  Electronically Signed Darreld Mclean, MD 2/27/20258:56 AM

## 2023-08-02 ENCOUNTER — Other Ambulatory Visit (HOSPITAL_COMMUNITY)
Admission: RE | Admit: 2023-08-02 | Discharge: 2023-08-02 | Disposition: A | Discharge status: Home / Self Care | Source: Skilled Nursing Facility | Attending: Adult Health | Admitting: Adult Health

## 2023-08-02 DIAGNOSIS — Z1159 Encounter for screening for other viral diseases: Secondary | ICD-10-CM | POA: Diagnosis not present

## 2023-08-02 DIAGNOSIS — R197 Diarrhea, unspecified: Secondary | ICD-10-CM | POA: Insufficient documentation

## 2023-08-02 LAB — RESP PANEL BY RT-PCR (RSV, FLU A&B, COVID)  RVPGX2
Influenza A by PCR: NEGATIVE
Influenza B by PCR: NEGATIVE
Resp Syncytial Virus by PCR: NEGATIVE
SARS Coronavirus 2 by RT PCR: NEGATIVE

## 2023-08-07 ENCOUNTER — Encounter: Payer: Self-pay | Admitting: Adult Health

## 2023-08-07 ENCOUNTER — Non-Acute Institutional Stay (SKILLED_NURSING_FACILITY): Admitting: Adult Health

## 2023-08-07 DIAGNOSIS — M546 Pain in thoracic spine: Secondary | ICD-10-CM

## 2023-08-07 DIAGNOSIS — G8929 Other chronic pain: Secondary | ICD-10-CM | POA: Diagnosis not present

## 2023-08-07 DIAGNOSIS — I10 Essential (primary) hypertension: Secondary | ICD-10-CM | POA: Diagnosis not present

## 2023-08-07 NOTE — Progress Notes (Unsigned)
 Location:  [facility] Room Number: 119 Place of Service:  SNF (31)   CODE STATUS: dnr   Allergies  Allergen Reactions   Septra Ds [Sulfamethoxazole-Trimethoprim]    Lisinopril Other (See Comments)    Cough    Chief Complaint  Patient presents with   Acute Visit    Right lower back pain     HPI:  She is having worsening right side back pain. The pain does not radiate. She states that she is having increased difficulty sitting in the wheelchair. She states that the tylenol is not effective in managing her pain. She has taken the robaxin less than 5 times this month. Her blood pressure readings have been elevated.   Past Medical History:  Diagnosis Date   Acute metabolic encephalopathy 07/2021   Atrial fibrillation (HCC)    Bleeding stomach ulcer 1980s?   CVA (cerebral vascular accident) (HCC) 07/2021   DIVERTICULOSIS, COLON 11/22/2006   Headache(784.0)    "very often; not regular" (11/25/2015)   Heart block    hx of   Hip fracture (HCC) 07/2021   History of blood transfusion 1980s   "when I had the bleeding ulcer"   HYPERLIPIDEMIA 11/22/2006   Hypertension    Melanoma (HCC)    LLE   Menopausal syndrome (hot flashes)    OSTEOPOROSIS 11/22/2006   Presence of permanent cardiac pacemaker    Vitamin D deficiency 08/21/2021    Past Surgical History:  Procedure Laterality Date   APPENDECTOMY     BUNIONECTOMY Right    CATARACT EXTRACTION W/ INTRAOCULAR LENS  IMPLANT, BILATERAL Bilateral 2000s   CHOLECYSTECTOMY OPEN     EP IMPLANTABLE DEVICE N/A 11/25/2015   Procedure: Pacemaker Implant;  Surgeon: Will Jorja Loa, MD;  Location: [facility];  Service: Cardiovascular;  Laterality: N/A;   FEMUR IM NAIL Left 07/08/2014   Procedure: INTRAMEDULLARY (IM) NAIL ;  Surgeon: Cammy Copa, MD;  Location: [facility] ORS;  Service: Orthopedics;  Laterality: Left;   FRACTURE SURGERY     HIP ARTHROPLASTY Right 08/20/2021   Procedure: ARTHROPLASTY  BIPOLAR HIP (HEMIARTHROPLASTY);  Surgeon: Myrene Galas, MD;  Location: [facility];  Service: Orthopedics;  Laterality: Right;   IR KYPHO LUMBAR INC FX REDUCE BONE BX UNI/BIL CANNULATION INC/IMAGING  03/21/2021   PACEMAKER INSERTION     REVISION TOTAL HIP ARTHROPLASTY Left 06/2014    Social History   Socioeconomic History   Marital status: Widowed    Spouse name: Not on file   Number of children: 2   Years of education: 73   Highest education level: Not on file  Occupational History    Comment: retired  Tobacco Use   Smoking status: Never    Passive exposure: Never   Smokeless tobacco: Never  Vaping Use   Vaping status: Never Used  Substance and Sexual Activity   Alcohol use: No    Alcohol/week: 0.0 standard drinks of alcohol   Drug use: No   Sexual activity: Never  Other Topics Concern   Not on file  Social History Narrative   09/21/21 resides at [facility]      Lives alone, son lives beside her   Caffeine - none   Social Drivers of Health   Financial Resource Strain: Low Risk  (05/13/2020)   Overall Financial Resource Strain (CARDIA)    Difficulty of Paying Living Expenses: Not hard at all  Food Insecurity: No Food Insecurity (05/13/2020)   Hunger Vital Sign  Worried About Programme researcher, broadcasting/film/video in the Last Year: Never true    Ran Out of Food in the Last Year: Never true  Transportation Needs: No Transportation Needs (05/13/2020)   PRAPARE - Administrator, Civil Service (Medical): No    Lack of Transportation (Non-Medical): No  Physical Activity: Inactive (05/13/2020)   Exercise Vital Sign    Days of Exercise per Week: 0 days    Minutes of Exercise per Session: 0 min  Stress: No Stress Concern Present (05/13/2020)   [employer] of Occupational Health - Occupational Stress Questionnaire    Feeling of Stress : Not at all  Social Connections: Socially Isolated (05/13/2020)   Social Connection and Isolation Panel [NHANES]    Frequency of Communication  with Friends and Family: More than three times a week    Frequency of Social Gatherings with Friends and Family: More than three times a week    Attends Religious Services: Never    Database administrator or Organizations: No    Attends Banker Meetings: Never    Marital Status: Widowed  Intimate Partner Violence: Not At Risk (05/13/2020)   Humiliation, Afraid, Rape, and Kick questionnaire    Fear of Current or Ex-Partner: No    Emotionally Abused: No    Physically Abused: No    Sexually Abused: No   Family History  Problem Relation Age of Onset   Alzheimer's disease Sister    Cardiomyopathy Brother       VITAL SIGNS BP (!) 177/68   Pulse 64   Temp 98.2 F (36.8 C)   Resp 16   Ht 4\' 11"  (1.499 m)   Wt 110 lb 9.6 oz (50.2 kg)   SpO2 98%   BMI 22.34 kg/m   Outpatient Encounter Medications as of 08/07/2023  Medication Sig   Acetaminophen (TYLENOL ARTHRITIS PAIN PO) Take 650 mg by mouth 3 (three) times daily.   amiodarone (PACERONE) 100 MG tablet Take 100 mg by mouth daily.   aspirin 81 MG chewable tablet Chew 1 tablet (81 mg total) by mouth daily.   Camphor-Menthol-Methyl Sal (SALONPAS) 3.05-27-08 % PTCH Apply 1 patch topically daily. At 9 am   feeding supplement (BOOST HIGH PROTEIN) LIQD Take 1 Container by mouth in the morning and at bedtime. 0.06 gram- 1 kcal/mL; amt:   NON FORMULARY Diet - Dysphagia 3   ondansetron (ZOFRAN-ODT) 4 MG disintegrating tablet Take 4 mg by mouth every 8 (eight) hours as needed for nausea or vomiting.   senna-docusate (SENNA S) 8.6-50 MG tablet Take 1 tablet by mouth 2 (two) times daily.   No facility-administered encounter medications on file as of 08/07/2023.     SIGNIFICANT DIAGNOSTIC EXAMS  LABS REVIEWED; PREVIOUS    10-09-22: wbc 5.8; hgb 11.1; hct 35.4; mcv 100; plt 257; glucose 80; bun 21; creat 0.95; k+ 4.0; na++ 138; ca 8.5; gfr 57; protein 5.8; protein 3.1   01-11-23: d-dimer: 0.40 04-03-23: wbc 10.5; hgb 12.7;  hct 39.6; mcv 96.6 plt 292; glucose 124; bun 11; creat 0.82; k+ 3.3; na++ 137; ca 9.2; gfr >60; urine culture: e-coli: augmentin  04-09-23: k+ 3.9 04-10-23: wbc 7.4; hgb 14.0; hct 44.1; mcv 96.1 plt 256; glucose 126; bun 18; creat 0.97; k+ 3.6; na++ 134; ca 8.7; gfr 55  NO NEW LABS      Review of Systems  Constitutional:  Negative for malaise/fatigue.  Respiratory:  Negative for cough and shortness of breath.   Cardiovascular:  Negative for chest pain, palpitations and leg swelling.  Gastrointestinal:  Negative for abdominal pain, constipation and heartburn.  Musculoskeletal:  Positive for back pain. Negative for joint pain and myalgias.  Skin: Negative.   Neurological:  Negative for dizziness.  Psychiatric/Behavioral:  The patient is not nervous/anxious.    Physical Exam Constitutional:      General: She is not in acute distress.    Appearance: She is well-developed. She is not diaphoretic.  Neck:     Thyroid: No thyromegaly.  Cardiovascular:     Rate and Rhythm: Normal rate and regular rhythm.     Heart sounds: Normal heart sounds.  Pulmonary:     Effort: Pulmonary effort is normal. No respiratory distress.     Breath sounds: Normal breath sounds.  Abdominal:     General: Bowel sounds are normal. There is no distension.     Palpations: Abdomen is soft.     Tenderness: There is no abdominal tenderness.  Musculoskeletal:        General: Normal range of motion.     Cervical back: Neck supple.     Right lower leg: No edema.     Left lower leg: No edema.     Comments: Has some mild tenderness to right lower back   Lymphadenopathy:     Cervical: No cervical adenopathy.  Skin:    General: Skin is warm and dry.  Neurological:     Mental Status: She is alert. Mental status is at baseline.  Psychiatric:        Mood and Affect: Mood normal.      ASSESSMENT/ PLAN:  TODAY  Chronic right sided thoracic back pain: not being adequately managed: will change the robaxin to 250  mg three times daily on a routine basis   2. Essential hypertension: is not well controlled; will begin norvasc 5 mg daily. Will monitor    Synthia Innocent NP [facility]   call [phone]

## 2023-08-20 ENCOUNTER — Non-Acute Institutional Stay (SKILLED_NURSING_FACILITY): Admitting: Adult Health

## 2023-08-20 ENCOUNTER — Encounter: Payer: Self-pay | Admitting: Adult Health

## 2023-08-20 DIAGNOSIS — E538 Deficiency of other specified B group vitamins: Secondary | ICD-10-CM

## 2023-08-20 DIAGNOSIS — G8929 Other chronic pain: Secondary | ICD-10-CM | POA: Diagnosis not present

## 2023-08-20 DIAGNOSIS — M545 Low back pain, unspecified: Secondary | ICD-10-CM

## 2023-08-20 DIAGNOSIS — E559 Vitamin D deficiency, unspecified: Secondary | ICD-10-CM

## 2023-08-20 NOTE — Progress Notes (Unsigned)
 Location:  [facility] Room Number: 118 Place of Service:  SNF (31)   CODE STATUS: dnr   Allergies  Allergen Reactions   Septra Ds [Sulfamethoxazole-Trimethoprim]    Lisinopril Other (See Comments)    Cough    Chief Complaint  Patient presents with   Medical Management of Chronic Issues         Chronic low back pain: Vitamin D deficiency:  Vitamin B 12 deficiency:     HPI:  She is a 89 year old long term resident of this facility being seen for the management of her chronic illnesses: Chronic low back pain: Vitamin D deficiency:  Vitamin B 12 deficiency. Her back pain is presently being managed with routine robaxin. Her weight is without significant change. She denies any anxiety.   Past Medical History:  Diagnosis Date   Acute metabolic encephalopathy 07/2021   Atrial fibrillation (HCC)    Bleeding stomach ulcer 1980s?   CVA (cerebral vascular accident) (HCC) 07/2021   DIVERTICULOSIS, COLON 11/22/2006   Headache(784.0)    "very often; not regular" (11/25/2015)   Heart block    hx of   Hip fracture (HCC) 07/2021   History of blood transfusion 1980s   "when I had the bleeding ulcer"   HYPERLIPIDEMIA 11/22/2006   Hypertension    Melanoma (HCC)    LLE   Menopausal syndrome (hot flashes)    OSTEOPOROSIS 11/22/2006   Presence of permanent cardiac pacemaker    Vitamin D deficiency 08/21/2021    Past Surgical History:  Procedure Laterality Date   APPENDECTOMY     BUNIONECTOMY Right    CATARACT EXTRACTION W/ INTRAOCULAR LENS  IMPLANT, BILATERAL Bilateral 2000s   CHOLECYSTECTOMY OPEN     EP IMPLANTABLE DEVICE N/A 11/25/2015   Procedure: Pacemaker Implant;  Surgeon: Will Jorja Loa, MD;  Location: [facility];  Service: Cardiovascular;  Laterality: N/A;   FEMUR IM NAIL Left 07/08/2014   Procedure: INTRAMEDULLARY (IM) NAIL ;  Surgeon: Cammy Copa, MD;  Location: [facility];  Service: Orthopedics;  Laterality: Left;   FRACTURE  SURGERY     HIP ARTHROPLASTY Right 08/20/2021   Procedure: ARTHROPLASTY BIPOLAR HIP (HEMIARTHROPLASTY);  Surgeon: Myrene Galas, MD;  Location: [facility] OR;  Service: Orthopedics;  Laterality: Right;   IR KYPHO LUMBAR INC FX REDUCE BONE BX UNI/BIL CANNULATION INC/IMAGING  03/21/2021   PACEMAKER INSERTION     REVISION TOTAL HIP ARTHROPLASTY Left 06/2014    Social History   Socioeconomic History   Marital status: Widowed    Spouse name: Not on file   Number of children: 2   Years of education: 67   Highest education level: Not on file  Occupational History    Comment: retired  Tobacco Use   Smoking status: Never    Passive exposure: Never   Smokeless tobacco: Never  Vaping Use   Vaping status: Never Used  Substance and Sexual Activity   Alcohol use: No    Alcohol/week: 0.0 standard drinks of alcohol   Drug use: No   Sexual activity: Never  Other Topics Concern   Not on file  Social History Narrative   09/21/21 resides at [facility]      Lives alone, son lives beside her   Caffeine - none   Social Drivers of Health   Financial Resource Strain: Low Risk  (05/13/2020)   Overall Financial Resource Strain (CARDIA)    Difficulty of Paying Living Expenses: Not hard at all  Food Insecurity: No Food Insecurity (05/13/2020)   Hunger Vital Sign    Worried About Running Out of Food in the Last Year: Never true    Ran Out of Food in the Last Year: Never true  Transportation Needs: No Transportation Needs (05/13/2020)   PRAPARE - Administrator, Civil Service (Medical): No    Lack of Transportation (Non-Medical): No  Physical Activity: Inactive (05/13/2020)   Exercise Vital Sign    Days of Exercise per Week: 0 days    Minutes of Exercise per Session: 0 min  Stress: No Stress Concern Present (05/13/2020)   [employer] of Occupational Health - Occupational Stress Questionnaire    Feeling of Stress : Not at all  Social Connections: Socially Isolated (05/13/2020)    Social Connection and Isolation Panel [NHANES]    Frequency of Communication with Friends and Family: More than three times a week    Frequency of Social Gatherings with Friends and Family: More than three times a week    Attends Religious Services: Never    Database administrator or Organizations: No    Attends Banker Meetings: Never    Marital Status: Widowed  Intimate Partner Violence: Not At Risk (05/13/2020)   Humiliation, Afraid, Rape, and Kick questionnaire    Fear of Current or Ex-Partner: No    Emotionally Abused: No    Physically Abused: No    Sexually Abused: No   Family History  Problem Relation Age of Onset   Alzheimer's disease Sister    Cardiomyopathy Brother       VITAL SIGNS BP (!) 159/60   Pulse 80   Temp (!) 97.4 F (36.3 C)   Resp 18   Ht 4\' 11"  (1.499 m)   Wt 110 lb 9.6 oz (50.2 kg)   SpO2 100%   BMI 22.34 kg/m   Outpatient Encounter Medications as of 08/20/2023  Medication Sig   Acetaminophen (TYLENOL ARTHRITIS PAIN PO) Take 650 mg by mouth 3 (three) times daily.   amiodarone (PACERONE) 100 MG tablet Take 100 mg by mouth daily.   aspirin 81 MG chewable tablet Chew 1 tablet (81 mg total) by mouth daily.   Camphor-Menthol-Methyl Sal (SALONPAS) 3.05-27-08 % PTCH Apply 1 patch topically daily. At 9 am   feeding supplement (BOOST HIGH PROTEIN) LIQD Take 1 Container by mouth in the morning and at bedtime. 0.06 gram- 1 kcal/mL; amt:   methocarbamol (ROBAXIN) 500 MG tablet Take 250 mg by mouth 3 (three) times daily.   NON FORMULARY Diet - Dysphagia 3   ondansetron (ZOFRAN-ODT) 4 MG disintegrating tablet Take 4 mg by mouth every 8 (eight) hours as needed for nausea or vomiting.   senna-docusate (SENNA S) 8.6-50 MG tablet Take 1 tablet by mouth 2 (two) times daily.   No facility-administered encounter medications on file as of 08/20/2023.     SIGNIFICANT DIAGNOSTIC EXAMS  LABS REVIEWED; PREVIOUS    10-09-22: wbc 5.8; hgb 11.1; hct 35.4;  mcv 100; plt 257; glucose 80; bun 21; creat 0.95; k+ 4.0; na++ 138; ca 8.5; gfr 57; protein 5.8; protein 3.1   01-11-23: d-dimer: 0.40 04-03-23: wbc 10.5; hgb 12.7; hct 39.6; mcv 96.6 plt 292; glucose 124; bun 11; creat 0.82; k+ 3.3; na++ 137; ca 9.2; gfr >60; urine culture: e-coli: augmentin  04-09-23: k+ 3.9 04-10-23: wbc 7.4; hgb 14.0; hct 44.1; mcv 96.1 plt 256; glucose 126; bun 18; creat 0.97; k+ 3.6; na++ 134; ca 8.7; gfr 55  NO NEW LABS      Review of Systems  Constitutional:  Negative for malaise/fatigue.  Respiratory:  Negative for cough and shortness of breath.   Cardiovascular:  Negative for chest pain, palpitations and leg swelling.  Gastrointestinal:  Negative for abdominal pain, constipation and heartburn.  Musculoskeletal:  Negative for back pain, joint pain and myalgias.  Skin: Negative.   Neurological:  Negative for dizziness.  Psychiatric/Behavioral:  The patient is not nervous/anxious.    Physical Exam Constitutional:      General: She is not in acute distress.    Appearance: She is underweight. She is not diaphoretic.  Neck:     Thyroid: No thyromegaly.  Cardiovascular:     Rate and Rhythm: Normal rate and regular rhythm.     Pulses: Normal pulses.     Heart sounds: Normal heart sounds.  Pulmonary:     Effort: Pulmonary effort is normal. No respiratory distress.     Breath sounds: Normal breath sounds.  Abdominal:     General: Bowel sounds are normal. There is no distension.     Palpations: Abdomen is soft.     Tenderness: There is no abdominal tenderness.  Musculoskeletal:        General: Normal range of motion.     Cervical back: Neck supple.     Right lower leg: No edema.     Left lower leg: No edema.  Lymphadenopathy:     Cervical: No cervical adenopathy.  Skin:    General: Skin is warm and dry.  Neurological:     Mental Status: She is alert. Mental status is at baseline.     Comments: SLUMS 10/30  Psychiatric:        Mood and Affect: Mood  normal.             ASSESSMENT/ PLAN:  TODAY  Chronic low back pain: will continue robaxin 250 mg three times daily   2. Vitamin D deficiency: level 45.06  3. Vitamin B 12 deficiency: level 204   PREVIOUS   4. Aortic atherosclerosis (ct 08-02-21) is on asa; off statin due to advanced age.   5. Post menopausal osteoporosis: t score -4.184 will monitor   6. Protein calorie malnutrition: albumin 3.1; will continue supplements as directed.   7. History of CVA (cerebrovascular accident) is on asa 81 mg daily   8. Atrial fibrillation/sick sinus syndrome: is status post pacemaker will continue amiodarone 100 mg for rate control and asa 81 mg daily   9. Primary hypertension: b/p 159/60 will continue norvasc 5 mg daily   10. Gastroesophageal reflux disease without esophagitis: will monitor   11. Chronic kidney disease stage 3b: bun 18; creat 0.97; gfr 55  12. Anemia due to chronic kidney disease: hgb 14.0  13. Mixed hyperlipidemia: off statin due to advanced age.     Synthia Innocent NP [facility]   call [phone]

## 2023-08-23 ENCOUNTER — Encounter: Payer: Self-pay | Admitting: Adult Health

## 2023-08-23 ENCOUNTER — Other Ambulatory Visit (HOSPITAL_COMMUNITY)
Admission: RE | Admit: 2023-08-23 | Discharge: 2023-08-23 | Disposition: A | Discharge status: Home / Self Care | Source: Skilled Nursing Facility | Attending: Adult Health | Admitting: Adult Health

## 2023-08-23 ENCOUNTER — Non-Acute Institutional Stay (SKILLED_NURSING_FACILITY): Payer: Self-pay | Admitting: Adult Health

## 2023-08-23 DIAGNOSIS — E559 Vitamin D deficiency, unspecified: Secondary | ICD-10-CM

## 2023-08-23 DIAGNOSIS — I10 Essential (primary) hypertension: Secondary | ICD-10-CM | POA: Diagnosis not present

## 2023-08-23 DIAGNOSIS — E039 Hypothyroidism, unspecified: Secondary | ICD-10-CM | POA: Insufficient documentation

## 2023-08-23 LAB — VITAMIN D 25 HYDROXY (VIT D DEFICIENCY, FRACTURES): Vit D, 25-Hydroxy: 25.99 ng/mL — ABNORMAL LOW (ref 30–100)

## 2023-08-23 LAB — TSH: TSH: 1.577 u[IU]/mL (ref 0.350–4.500)

## 2023-08-23 NOTE — Progress Notes (Signed)
 Location:  [facility] Room Number: 118 Place of Service:  SNF (31)   CODE STATUS: dnr   Allergies  Allergen Reactions   Septra Ds [Sulfamethoxazole-Trimethoprim]    Lisinopril Other (See Comments)    Cough    Chief Complaint  Patient presents with   Acute Visit    Follow up labs and hypertension     HPI:  Her vitamin D level is low at 25.99. her blood pressure readings are elevated as well. She denies any chest pain; no headaches; no visual changes.   Past Medical History:  Diagnosis Date   Acute metabolic encephalopathy 07/2021   Atrial fibrillation (HCC)    Bleeding stomach ulcer 1980s?   CVA (cerebral vascular accident) (HCC) 07/2021   DIVERTICULOSIS, COLON 11/22/2006   Headache(784.0)    "very often; not regular" (11/25/2015)   Heart block    hx of   Hip fracture (HCC) 07/2021   History of blood transfusion 1980s   "when I had the bleeding ulcer"   HYPERLIPIDEMIA 11/22/2006   Hypertension    Melanoma (HCC)    LLE   Menopausal syndrome (hot flashes)    OSTEOPOROSIS 11/22/2006   Presence of permanent cardiac pacemaker    Vitamin D deficiency 08/21/2021    Past Surgical History:  Procedure Laterality Date   APPENDECTOMY     BUNIONECTOMY Right    CATARACT EXTRACTION W/ INTRAOCULAR LENS  IMPLANT, BILATERAL Bilateral 2000s   CHOLECYSTECTOMY OPEN     EP IMPLANTABLE DEVICE N/A 11/25/2015   Procedure: Pacemaker Implant;  Surgeon: Will Jorja Loa, MD;  [facility]: [facility];  Service: Cardiovascular;  Laterality: N/A;   FEMUR IM NAIL Left 07/08/2014   Procedure: INTRAMEDULLARY (IM) NAIL ;  Surgeon: Cammy Copa, MD;  Location: [facility] ORS;  Service: Orthopedics;  Laterality: Left;   FRACTURE SURGERY     HIP ARTHROPLASTY Right 08/20/2021   Procedure: ARTHROPLASTY BIPOLAR HIP (HEMIARTHROPLASTY);  Surgeon: Myrene Galas, MD;  Location: [facility] OR;  Service: Orthopedics;  Laterality: Right;   IR KYPHO LUMBAR INC FX REDUCE BONE BX  UNI/BIL CANNULATION INC/IMAGING  03/21/2021   PACEMAKER INSERTION     REVISION TOTAL HIP ARTHROPLASTY Left 06/2014    Social History   Socioeconomic History   Marital status: Widowed    Spouse name: Not on file   Number of children: 2   Years of education: 17   Highest education level: Not on file  Occupational History    Comment: retired  Tobacco Use   Smoking status: Never    Passive exposure: Never   Smokeless tobacco: Never  Vaping Use   Vaping status: Never Used  Substance and Sexual Activity   Alcohol use: No    Alcohol/week: 0.0 standard drinks of alcohol   Drug use: No   Sexual activity: Never  Other Topics Concern   Not on file  Social History Narrative   09/21/21 resides at [facility]      Lives alone, son lives beside her   Caffeine - none   Social Drivers of Health   Financial Resource Strain: Low Risk  (05/13/2020)   Overall Financial Resource Strain (CARDIA)    Difficulty of Paying Living Expenses: Not hard at all  Food Insecurity: No Food Insecurity (05/13/2020)   Hunger Vital Sign    Worried About Running Out of Food in the Last Year: Never true    Ran Out of Food in the Last Year: Never true  Transportation Needs:  No Transportation Needs (05/13/2020)   PRAPARE - Administrator, Civil Service (Medical): No    Lack of Transportation (Non-Medical): No  Physical Activity: Inactive (05/13/2020)   Exercise Vital Sign    Days of Exercise per Week: 0 days    Minutes of Exercise per Session: 0 min  Stress: No Stress Concern Present (05/13/2020)   [employer] of Occupational Health - Occupational Stress Questionnaire    Feeling of Stress : Not at all  Social Connections: Socially Isolated (05/13/2020)   Social Connection and Isolation Panel [NHANES]    Frequency of Communication with Friends and Family: More than three times a week    Frequency of Social Gatherings with Friends and Family: More than three times a week    Attends Religious  Services: Never    Database administrator or Organizations: No    Attends Banker Meetings: Never    Marital Status: Widowed  Intimate Partner Violence: Not At Risk (05/13/2020)   Humiliation, Afraid, Rape, and Kick questionnaire    Fear of Current or Ex-Partner: No    Emotionally Abused: No    Physically Abused: No    Sexually Abused: No   Family History  Problem Relation Age of Onset   Alzheimer's disease Sister    Cardiomyopathy Brother       VITAL SIGNS BP (!) 159/60   Pulse 80   Temp (!) 97.4 F (36.3 C)   Resp 18   Ht 4\' 11"  (1.499 m)   Wt 110 lb (49.9 kg)   SpO2 100%   BMI 22.22 kg/m   Outpatient Encounter Medications as of 08/23/2023  Medication Sig   Acetaminophen (TYLENOL ARTHRITIS PAIN PO) Take 650 mg by mouth 3 (three) times daily.   amiodarone (PACERONE) 100 MG tablet Take 100 mg by mouth daily.   aspirin 81 MG chewable tablet Chew 1 tablet (81 mg total) by mouth daily.   Camphor-Menthol-Methyl Sal (SALONPAS) 3.05-27-08 % PTCH Apply 1 patch topically daily. At 9 am   feeding supplement (BOOST HIGH PROTEIN) LIQD Take 1 Container by mouth in the morning and at bedtime. 0.06 gram- 1 kcal/mL; amt:   methocarbamol (ROBAXIN) 500 MG tablet Take 250 mg by mouth 3 (three) times daily.   NON FORMULARY Diet - Dysphagia 3   ondansetron (ZOFRAN-ODT) 4 MG disintegrating tablet Take 4 mg by mouth every 8 (eight) hours as needed for nausea or vomiting.   senna-docusate (SENNA S) 8.6-50 MG tablet Take 1 tablet by mouth 2 (two) times daily.   No facility-administered encounter medications on file as of 08/23/2023.     SIGNIFICANT DIAGNOSTIC EXAMS  LABS REVIEWED; PREVIOUS    10-09-22: wbc 5.8; hgb 11.1; hct 35.4; mcv 100; plt 257; glucose 80; bun 21; creat 0.95; k+ 4.0; na++ 138; ca 8.5; gfr 57; protein 5.8; protein 3.1   01-11-23: d-dimer: 0.40 04-03-23: wbc 10.5; hgb 12.7; hct 39.6; mcv 96.6 plt 292; glucose 124; bun 11; creat 0.82; k+ 3.3; na++ 137; ca  9.2; gfr >60; urine culture: e-coli: augmentin  04-09-23: k+ 3.9 04-10-23: wbc 7.4; hgb 14.0; hct 44.1; mcv 96.1 plt 256; glucose 126; bun 18; creat 0.97; k+ 3.6; na++ 134; ca 8.7; gfr 55  TODAY  08-23-23: tsh 1.577; vitamin D 25.99    Review of Systems  Constitutional:  Negative for malaise/fatigue.  Respiratory:  Negative for cough and shortness of breath.   Cardiovascular:  Negative for chest pain, palpitations and leg swelling.  Gastrointestinal:  Negative for abdominal pain, constipation and heartburn.  Musculoskeletal:  Negative for back pain, joint pain and myalgias.  Skin: Negative.   Neurological:  Negative for dizziness.  Psychiatric/Behavioral:  The patient is not nervous/anxious.    Physical Exam Constitutional:      General: She is not in acute distress.    Appearance: She is well-developed. She is not diaphoretic.  Neck:     Thyroid: No thyromegaly.  Cardiovascular:     Rate and Rhythm: Normal rate and regular rhythm.     Pulses: Normal pulses.     Heart sounds: Normal heart sounds.  Pulmonary:     Effort: Pulmonary effort is normal. No respiratory distress.     Breath sounds: Normal breath sounds.  Abdominal:     General: Bowel sounds are normal. There is no distension.     Palpations: Abdomen is soft.     Tenderness: There is no abdominal tenderness.  Musculoskeletal:        General: Normal range of motion.     Cervical back: Neck supple.     Right lower leg: No edema.     Left lower leg: No edema.  Lymphadenopathy:     Cervical: No cervical adenopathy.  Skin:    General: Skin is warm and dry.  Neurological:     Mental Status: She is alert. Mental status is at baseline.  Psychiatric:        Mood and Affect: Mood normal.     ASSESSMENT/ PLAN:  TODAY  Essential hypertension Vitamin  D deficits  Will begin vitamin D 50,000 units weekly Will increase norvasc to 10 mg daily    Synthia Innocent NP [facility]   call [phone]

## 2023-09-03 ENCOUNTER — Non-Acute Institutional Stay (SKILLED_NURSING_FACILITY): Payer: Self-pay | Admitting: Adult Health

## 2023-09-03 DIAGNOSIS — I700 Atherosclerosis of aorta: Secondary | ICD-10-CM

## 2023-09-03 DIAGNOSIS — E43 Unspecified severe protein-calorie malnutrition: Secondary | ICD-10-CM | POA: Diagnosis not present

## 2023-09-03 DIAGNOSIS — M810 Age-related osteoporosis without current pathological fracture: Secondary | ICD-10-CM | POA: Diagnosis not present

## 2023-09-03 NOTE — Progress Notes (Signed)
 Location:  [facility] Room Number: 118 Place of Service:  SNF (31)   CODE STATUS: dnr   Allergies  Allergen Reactions   Septra  Ds [Sulfamethoxazole -Trimethoprim ]    Lisinopril  Other (See Comments)    Cough    Chief Complaint  Patient presents with   Medical Management of Chronic Issues        Aortic atherosclerosis (ct 08-02-21) Post menopausal osteoporosis: Protein calorie malnutrition     HPI:  She is a 89 y.o. long term resident of this facility being seen for the management of her chronic illnesses:Aortic atherosclerosis (ct 08-02-21) Post menopausal osteoporosis: Protein calorie malnutrition. There are no reports of uncontrolled pain. Weight is stable. No reports of anxiety or depressive thoughts.    Past Medical History:  Diagnosis Date   Acute metabolic encephalopathy 07/2021   Atrial fibrillation (HCC)    Bleeding stomach ulcer 1980s?   CVA (cerebral vascular accident) (HCC) 07/2021   DIVERTICULOSIS, COLON 11/22/2006   Headache(784.0)    "very often; not regular" (11/25/2015)   Heart block    hx of   Hip fracture (HCC) 07/2021   History of blood transfusion 1980s   "when I had the bleeding ulcer"   HYPERLIPIDEMIA 11/22/2006   Hypertension    Melanoma (HCC)    LLE   Menopausal syndrome (hot flashes)    OSTEOPOROSIS 11/22/2006   Presence of permanent cardiac pacemaker    Vitamin D  deficiency 08/21/2021    Past Surgical History:  Procedure Laterality Date   APPENDECTOMY     BUNIONECTOMY Right    CATARACT EXTRACTION W/ INTRAOCULAR LENS  IMPLANT, BILATERAL Bilateral 2000s   CHOLECYSTECTOMY OPEN     EP IMPLANTABLE DEVICE N/A 11/25/2015   Procedure: Pacemaker Implant;  Surgeon: Will Cortland Ding, MD;  Location: [facility];  Service: Cardiovascular;  Laterality: N/A;   FEMUR IM NAIL Left 07/08/2014   Procedure: INTRAMEDULLARY (IM) NAIL ;  Surgeon: Jasmine Mesi, MD;  Location: [facility] ORS;  Service: Orthopedics;  Laterality:  Left;   FRACTURE SURGERY     HIP ARTHROPLASTY Right 08/20/2021   Procedure: ARTHROPLASTY BIPOLAR HIP (HEMIARTHROPLASTY);  Surgeon: Hardy Lia, MD;  Location: [facility] OR;  Service: Orthopedics;  Laterality: Right;   IR KYPHO LUMBAR INC FX REDUCE BONE BX UNI/BIL CANNULATION INC/IMAGING  03/21/2021   PACEMAKER INSERTION     REVISION TOTAL HIP ARTHROPLASTY Left 06/2014    Social History   Socioeconomic History   Marital status: Widowed    Spouse name: Not on file   Number of children: 2   Years of education: 53   Highest education level: Not on file  Occupational History    Comment: retired  Tobacco Use   Smoking status: Never    Passive exposure: Never   Smokeless tobacco: Never  Vaping Use   Vaping status: Never Used  Substance and Sexual Activity   Alcohol  use: No    Alcohol /week: 0.0 standard drinks of alcohol    Drug use: No   Sexual activity: Never  Other Topics Concern   Not on file  Social History Narrative   09/21/21 resides at [facility]      Lives alone, son lives beside her   Caffeine - none   Social Drivers of Health   Financial Resource Strain: Low Risk  (05/13/2020)   Overall Financial Resource Strain (CARDIA)    Difficulty of Paying Living Expenses: Not hard at all  Food Insecurity: No Food Insecurity (05/13/2020)   Hunger Vital  Sign    Worried About Programme researcher, broadcasting/film/video in the Last Year: Never true    Ran Out of Food in the Last Year: Never true  Transportation Needs: No Transportation Needs (05/13/2020)   PRAPARE - Administrator, Civil Service (Medical): No    Lack of Transportation (Non-Medical): No  Physical Activity: Inactive (05/13/2020)   Exercise Vital Sign    Days of Exercise per Week: 0 days    Minutes of Exercise per Session: 0 min  Stress: No Stress Concern Present (05/13/2020)   [employer] of Occupational Health - Occupational Stress Questionnaire    Feeling of Stress : Not at all  Social Connections: Socially Isolated  (05/13/2020)   Social Connection and Isolation Panel [NHANES]    Frequency of Communication with Friends and Family: More than three times a week    Frequency of Social Gatherings with Friends and Family: More than three times a week    Attends Religious Services: Never    Database administrator or Organizations: No    Attends Banker Meetings: Never    Marital Status: Widowed  Intimate Partner Violence: Not At Risk (05/13/2020)   Humiliation, Afraid, Rape, and Kick questionnaire    Fear of Current or Ex-Partner: No    Emotionally Abused: No    Physically Abused: No    Sexually Abused: No   Family History  Problem Relation Age of Onset   Alzheimer's disease Sister    Cardiomyopathy Brother       VITAL SIGNS BP (!) 149/96   Pulse 64   Temp 97.7 F (36.5 C)   Resp 19   Ht 4\' 11"  (1.499 m)   Wt 113 lb 12.8 oz (51.6 kg)   SpO2 93%   BMI 22.98 kg/m   Outpatient Encounter Medications as of 09/03/2023  Medication Sig   Acetaminophen  (TYLENOL  ARTHRITIS PAIN PO) Take 650 mg by mouth 3 (three) times daily.   amiodarone  (PACERONE ) 100 MG tablet Take 100 mg by mouth daily.   amLODipine (NORVASC) 10 MG tablet Take 10 mg by mouth daily.   aspirin  81 MG chewable tablet Chew 1 tablet (81 mg total) by mouth daily.   Camphor-Menthol -Methyl Sal (SALONPAS) 3.05-27-08 % PTCH Apply 1 patch topically daily. At 9 am   ergocalciferol  (VITAMIN D2) 1.25 MG (50000 UT) capsule Take 50,000 Units by mouth once a week.   feeding supplement (BOOST HIGH PROTEIN) LIQD Take 1 Container by mouth in the morning and at bedtime. 0.06 gram- 1 kcal/mL; amt:   methocarbamol  (ROBAXIN ) 500 MG tablet Take 250 mg by mouth 3 (three) times daily.   NON FORMULARY Diet - Dysphagia 3   ondansetron  (ZOFRAN -ODT) 4 MG disintegrating tablet Take 4 mg by mouth every 8 (eight) hours as needed for nausea or vomiting.   senna-docusate (SENNA S) 8.6-50 MG tablet Take 1 tablet by mouth 2 (two) times daily.   No  facility-administered encounter medications on file as of 09/03/2023.     SIGNIFICANT DIAGNOSTIC EXAMS   LABS REVIEWED; PREVIOUS    10-09-22: wbc 5.8; hgb 11.1; hct 35.4; mcv 100; plt 257; glucose 80; bun 21; creat 0.95; k+ 4.0; na++ 138; ca 8.5; gfr 57; protein 5.8; protein 3.1   01-11-23: d-dimer: 0.40 04-03-23: wbc 10.5; hgb 12.7; hct 39.6; mcv 96.6 plt 292; glucose 124; bun 11; creat 0.82; k+ 3.3; na++ 137; ca 9.2; gfr >60; urine culture: e-coli: augmentin  04-09-23: k+ 3.9 04-10-23: wbc 7.4; hgb 14.0;  hct 44.1; mcv 96.1 plt 256; glucose 126; bun 18; creat 0.97; k+ 3.6; na++ 134; ca 8.7; gfr 55 08-23-23: tsh 1.577; vitamin D  25.99    NO NEW LABS   Review of Systems  Constitutional:  Negative for malaise/fatigue.  Respiratory:  Negative for cough and shortness of breath.   Cardiovascular:  Negative for chest pain, palpitations and leg swelling.  Gastrointestinal:  Negative for abdominal pain, constipation and heartburn.  Musculoskeletal:  Negative for back pain, joint pain and myalgias.  Skin: Negative.   Neurological:  Negative for dizziness.  Psychiatric/Behavioral:  The patient is not nervous/anxious.     Physical Exam Constitutional:      General: She is not in acute distress.    Appearance: She is underweight. She is not diaphoretic.  Neck:     Thyroid : No thyromegaly.  Cardiovascular:     Rate and Rhythm: Normal rate and regular rhythm.     Heart sounds: Normal heart sounds.  Pulmonary:     Effort: Pulmonary effort is normal. No respiratory distress.     Breath sounds: Normal breath sounds.  Abdominal:     General: Bowel sounds are normal. There is no distension.     Palpations: Abdomen is soft.     Tenderness: There is no abdominal tenderness.  Musculoskeletal:        General: Normal range of motion.     Cervical back: Neck supple.     Right lower leg: No edema.     Left lower leg: No edema.  Lymphadenopathy:     Cervical: No cervical adenopathy.  Skin:     General: Skin is warm and dry.  Neurological:     Mental Status: She is alert. Mental status is at baseline.     Comments:  SLUMS 10/30   Psychiatric:        Mood and Affect: Mood normal.           ASSESSMENT/ PLAN:  TODAY  Aortic atherosclerosis (ct 08-02-21) is on asa; off statin due to advanced age  72. Post menopausal osteoporosis: t score -4.184; will monitor   3. Protein calorie malnutrition: albumin 3.1 will continue supplements  as directed.   PREVIOUS   4. History of CVA (cerebrovascular accident) is on asa 81 mg daily   5. Atrial fibrillation/sick sinus syndrome: is status post pacemaker will continue amiodarone  100 mg for rate control and asa 81 mg daily   6. Primary hypertension: b/p 149/96 will continue norvasc 5 mg daily   7. Gastroesophageal reflux disease without esophagitis: will monitor   8. Chronic kidney disease stage 3b: bun 18; creat 0.97; gfr 55  9. Anemia due to chronic kidney disease: hgb 14.0  10. Mixed hyperlipidemia: off statin due to advanced age.   11. Chronic low back pain: will continue robaxin  250 mg three times daily   12. Vitamin D  deficiency: level 25.99; on 50,000 units weekly   13. Vitamin B 12 deficiency: level 204     Britt Candle NP [facility]  call [phone]

## 2023-09-04 ENCOUNTER — Non-Acute Institutional Stay (SKILLED_NURSING_FACILITY): Admitting: Adult Health

## 2023-09-04 ENCOUNTER — Encounter: Payer: Self-pay | Admitting: Adult Health

## 2023-09-04 DIAGNOSIS — N1832 Chronic kidney disease, stage 3b: Secondary | ICD-10-CM

## 2023-09-04 DIAGNOSIS — I495 Sick sinus syndrome: Secondary | ICD-10-CM

## 2023-09-04 DIAGNOSIS — E43 Unspecified severe protein-calorie malnutrition: Secondary | ICD-10-CM

## 2023-09-04 NOTE — Progress Notes (Signed)
 Location:  [facility] Room Number: 120 Place of Service:  SNF (31)   CODE STATUS: dnr   Allergies  Allergen Reactions   Septra Ds [Sulfamethoxazole-Trimethoprim]    Lisinopril Other (See Comments)    Cough    Chief Complaint  Patient presents with   Acute Visit    Care plan meeting     HPI:  We have come together for her care plan meeting. BIMS 8/15 mood 2/30: decreased appetite; trouble sleeping at times. Uses wheelchair has had one fall without injury. She requires maximum to dependent assist with adl care. She is incontinent of bladder and frequently incontinent of bowel. Dietary: regular diet feeds self, appetite >50%; weight is 114.6 pounds. Therapy: none at this time. She continues to be followed for her chronic illnesses including: Sick sinus syndrome  Chronic kidney disease stage 3b   Severe protein calorie malnutrition  Past Medical History:  Diagnosis Date   Acute metabolic encephalopathy 07/2021   Atrial fibrillation (HCC)    Bleeding stomach ulcer 1980s?   CVA (cerebral vascular accident) (HCC) 07/2021   DIVERTICULOSIS, COLON 11/22/2006   Headache(784.0)    "very often; not regular" (11/25/2015)   Heart block    hx of   Hip fracture (HCC) 07/2021   History of blood transfusion 1980s   "when I had the bleeding ulcer"   HYPERLIPIDEMIA 11/22/2006   Hypertension    Melanoma (HCC)    LLE   Menopausal syndrome (hot flashes)    OSTEOPOROSIS 11/22/2006   Presence of permanent cardiac pacemaker    Vitamin D deficiency 08/21/2021    Past Surgical History:  Procedure Laterality Date   APPENDECTOMY     BUNIONECTOMY Right    CATARACT EXTRACTION W/ INTRAOCULAR LENS  IMPLANT, BILATERAL Bilateral 2000s   CHOLECYSTECTOMY OPEN     EP IMPLANTABLE DEVICE N/A 11/25/2015   Procedure: Pacemaker Implant;  Surgeon: Will Cortland Ding, MD;  [facility]: [facility];  Service: Cardiovascular;  Laterality: N/A;   FEMUR IM NAIL Left 07/08/2014    Procedure: INTRAMEDULLARY (IM) NAIL ;  Surgeon: Jasmine Mesi, MD;  Location: [facility] ORS;  Service: Orthopedics;  Laterality: Left;   FRACTURE SURGERY     HIP ARTHROPLASTY Right 08/20/2021   Procedure: ARTHROPLASTY BIPOLAR HIP (HEMIARTHROPLASTY);  Surgeon: Hardy Lia, MD;  Location: [facility];  Service: Orthopedics;  Laterality: Right;   IR KYPHO LUMBAR INC FX REDUCE BONE BX UNI/BIL CANNULATION INC/IMAGING  03/21/2021   PACEMAKER INSERTION     REVISION TOTAL HIP ARTHROPLASTY Left 06/2014    Social History   Socioeconomic History   Marital status: Widowed    Spouse name: Not on file   Number of children: 2   Years of education: 31   Highest education level: Not on file  Occupational History    Comment: retired  Tobacco Use   Smoking status: Never    Passive exposure: Never   Smokeless tobacco: Never  Vaping Use   Vaping status: Never Used  Substance and Sexual Activity   Alcohol use: No    Alcohol/week: 0.0 standard drinks of alcohol   Drug use: No   Sexual activity: Never  Other Topics Concern   Not on file  Social History Narrative   09/21/21 resides at [facility]      Lives alone, son lives beside her   Caffeine - none   Social Drivers of Health   Financial Resource Strain: Low Risk  (05/13/2020)   Overall Financial Resource  Strain (CARDIA)    Difficulty of Paying Living Expenses: Not hard at all  Food Insecurity: No Food Insecurity (05/13/2020)   Hunger Vital Sign    Worried About Running Out of Food in the Last Year: Never true    Ran Out of Food in the Last Year: Never true  Transportation Needs: No Transportation Needs (05/13/2020)   PRAPARE - Administrator, Civil Service (Medical): No    Lack of Transportation (Non-Medical): No  Physical Activity: Inactive (05/13/2020)   Exercise Vital Sign    Days of Exercise per Week: 0 days    Minutes of Exercise per Session: 0 min  Stress: No Stress Concern Present (05/13/2020)   [employer] of  Occupational Health - Occupational Stress Questionnaire    Feeling of Stress : Not at all  Social Connections: Socially Isolated (05/13/2020)   Social Connection and Isolation Panel [NHANES]    Frequency of Communication with Friends and Family: More than three times a week    Frequency of Social Gatherings with Friends and Family: More than three times a week    Attends Religious Services: Never    Database administrator or Organizations: No    Attends Banker Meetings: Never    Marital Status: Widowed  Intimate Partner Violence: Not At Risk (05/13/2020)   Humiliation, Afraid, Rape, and Kick questionnaire    Fear of Current or Ex-Partner: No    Emotionally Abused: No    Physically Abused: No    Sexually Abused: No   Family History  Problem Relation Age of Onset   Alzheimer's disease Sister    Cardiomyopathy Brother       VITAL SIGNS BP 139/80   Pulse 64   Temp 97.7 F (36.5 C)   Resp 18   Ht 4\' 11"  (1.499 m)   Wt 114 lb 9.6 oz (52 kg)   SpO2 93%   BMI 23.15 kg/m   Outpatient Encounter Medications as of 09/04/2023  Medication Sig   Acetaminophen (TYLENOL ARTHRITIS PAIN PO) Take 650 mg by mouth 3 (three) times daily.   amiodarone (PACERONE) 100 MG tablet Take 100 mg by mouth daily.   amLODipine (NORVASC) 10 MG tablet Take 10 mg by mouth daily.   aspirin 81 MG chewable tablet Chew 1 tablet (81 mg total) by mouth daily.   Camphor-Menthol-Methyl Sal (SALONPAS) 3.05-27-08 % PTCH Apply 1 patch topically daily. At 9 am   ergocalciferol (VITAMIN D2) 1.25 MG (50000 UT) capsule Take 50,000 Units by mouth once a week.   feeding supplement (BOOST HIGH PROTEIN) LIQD Take 1 Container by mouth in the morning and at bedtime. 0.06 gram- 1 kcal/mL; amt: 237ml   methocarbamol (ROBAXIN) 500 MG tablet Take 250 mg by mouth 3 (three) times daily.   NON FORMULARY Diet - Dysphagia 3   ondansetron (ZOFRAN-ODT) 4 MG disintegrating tablet Take 4 mg by mouth every 8 (eight) hours as  needed for nausea or vomiting.   senna-docusate (SENNA S) 8.6-50 MG tablet Take 1 tablet by mouth 2 (two) times daily.   No facility-administered encounter medications on file as of 09/04/2023.     SIGNIFICANT DIAGNOSTIC EXAMS  LABS REVIEWED; PREVIOUS    10-09-22: wbc 5.8; hgb 11.1; hct 35.4; mcv 100; plt 257; glucose 80; bun 21; creat 0.95; k+ 4.0; na++ 138; ca 8.5; gfr 57; protein 5.8; protein 3.1   01-11-23: d-dimer: 0.40 04-03-23: wbc 10.5; hgb 12.7; hct 39.6; mcv 96.6 plt 292; glucose 124; bun 11;  creat 0.82; k+ 3.3; na++ 137; ca 9.2; gfr >60; urine culture: e-coli: augmentin  04-09-23: k+ 3.9 04-10-23: wbc 7.4; hgb 14.0; hct 44.1; mcv 96.1 plt 256; glucose 126; bun 18; creat 0.97; k+ 3.6; na++ 134; ca 8.7; gfr 55  TODAY  08-23-23: tsh 1.577; vitamin D 25.99    Review of Systems  Constitutional:  Negative for malaise/fatigue.  Respiratory:  Negative for cough and shortness of breath.   Cardiovascular:  Negative for chest pain, palpitations and leg swelling.  Gastrointestinal:  Negative for abdominal pain, constipation and heartburn.  Musculoskeletal:  Negative for back pain, joint pain and myalgias.  Skin: Negative.   Neurological:  Negative for dizziness.  Psychiatric/Behavioral:  The patient is not nervous/anxious.    Physical Exam Constitutional:      General: She is not in acute distress.    Appearance: She is well-developed. She is not diaphoretic.  Neck:     Thyroid: No thyromegaly.  Cardiovascular:     Rate and Rhythm: Normal rate and regular rhythm.     Heart sounds: Normal heart sounds.  Pulmonary:     Effort: Pulmonary effort is normal. No respiratory distress.     Breath sounds: Normal breath sounds.  Abdominal:     General: Bowel sounds are normal. There is no distension.     Palpations: Abdomen is soft.     Tenderness: There is no abdominal tenderness.  Musculoskeletal:        General: Normal range of motion.     Cervical back: Neck supple.     Right  lower leg: No edema.     Left lower leg: No edema.  Lymphadenopathy:     Cervical: No cervical adenopathy.  Skin:    General: Skin is warm and dry.  Neurological:     Mental Status: She is alert. Mental status is at baseline.  Psychiatric:        Mood and Affect: Mood normal.      ASSESSMENT/ PLAN:  TODAY  Sick sinus syndrome Chronic kidney disease stage 3b Severe protein calorie malnutrition  Will continue current medications Will continue current plan of care Will continue to monitor her status   Time spent with patient: 40 minutes: medications; dietary; plan of care.    Britt Candle NP [facility]   call [phone]

## 2023-09-09 DIAGNOSIS — K567 Ileus, unspecified: Secondary | ICD-10-CM | POA: Diagnosis not present

## 2023-10-02 ENCOUNTER — Encounter: Payer: Self-pay | Admitting: Internal Medicine

## 2023-10-02 ENCOUNTER — Non-Acute Institutional Stay (SKILLED_NURSING_FACILITY): Payer: Self-pay | Admitting: Internal Medicine

## 2023-10-02 DIAGNOSIS — I10 Essential (primary) hypertension: Secondary | ICD-10-CM | POA: Diagnosis not present

## 2023-10-02 DIAGNOSIS — E559 Vitamin D deficiency, unspecified: Secondary | ICD-10-CM | POA: Diagnosis not present

## 2023-10-02 DIAGNOSIS — R600 Localized edema: Secondary | ICD-10-CM | POA: Diagnosis not present

## 2023-10-02 DIAGNOSIS — I482 Chronic atrial fibrillation, unspecified: Secondary | ICD-10-CM

## 2023-10-02 NOTE — Assessment & Plan Note (Addendum)
 Systolic BP range 139- 177 on vasodilating CCB. Rate is slow.  Most recent GFR indicates CKD stage IIIa.  Assess BP & edema response to hydrochlorothiazide.  If no response; recheck renal function.

## 2023-10-02 NOTE — Progress Notes (Unsigned)
 NURSING HOME LOCATION:  [facility] ROOM NUMBER:  [id number] D  CODE STATUS:  DNR  PCP: Marilyne Shu, NP   This is a nursing facility follow up visit of chronic medical diagnoses to document compliance with Regulation 483.30 (c) in The Long Term Care Survey Manual Phase 2 which mandates caregiver visit ( visits can alternate among physician, PA or NP as per statutes) within 10 days of 30 days / 60 days/ 90 days post admission to SNF date  .  Interim medical record and care since last SNF visit was updated with review of diagnostic studies and change in clinical status since last visit were documented.  HPI: She is a permanent resident of this facility with medical diagnoses of essential hypertension, chronic atrial fibrillation, vitamin D  deficiency, peripheral edema, dyslipidemia, history of diverticulosis, osteoporosis, chronic back pain, GERD, normochromic/normocytic anemia, history of B12 deficiency, CKD stage IIIb, aortic atherosclerosis, history of stroke, and vascular dementia.  Review of systems: Dementia invalidated responses.  She initially said that she was "okay."  She then began to express concerns about swelling in her feet.  She stated that she was unsure why the feet were swelling "whether it is a heart monitor or what."  Apparently she elevates them with some positive response.  She denies any exertional dyspnea or paroxysmal nocturnal dyspnea.  Chronically she has intermittent right lower back pain for which topical medications are employed.  Constitutional: No fever, significant weight change, fatigue  Eyes: No redness, discharge, pain, vision change ENT/mouth: No nasal congestion,  purulent discharge, earache, change in hearing, sore throat  Cardiovascular: No chest pain, palpitations, claudication  Respiratory: No cough, sputum production, hemoptysis,  significant snoring, apnea   Gastrointestinal: No heartburn, dysphagia, abdominal pain, nausea /vomiting,  rectal bleeding, melena, change in bowels Genitourinary: No dysuria, hematuria, pyuria, incontinence, nocturia Dermatologic: No rash, pruritus, change in appearance of skin Neurologic: No dizziness, headache, syncope, seizures, numbness, tingling Psychiatric: No significant anxiety, depression, insomnia, anorexia Endocrine: No change in hair/skin/nails, excessive thirst, excessive hunger, excessive urination  Hematologic/lymphatic: No significant bruising, lymphadenopathy, abnormal bleeding Allergy/immunology: No itchy/watery eyes, significant sneezing, urticaria, angioedema  Physical exam:  Pertinent or positive findings: She appears suboptimally nourished.  She is missing multiple maxillary teeth.  Rhythm is slow and slightly irregular.  1st and 2nd heart sounds are slightly accentuated.  She has coarse rales in the right lower lobe with minimal rales in the left lower lobe.  Pedal pulses are not palpable.  There is 1+ edema of the LLE and 1/2+ at the right lower extremity.  Limb atrophy is present.  There is ecchymosis of the dorsum of the left hand.  She exhibits a fine tremor of both hands.  General appearance:  no acute distress, increased work of breathing is present.   Lymphatic: No lymphadenopathy about the head, neck, axilla. Eyes: No conjunctival inflammation or lid edema is present. There is no scleral icterus. Ears:  External ear exam shows no significant lesions or deformities.   Nose:  External nasal examination shows no deformity or inflammation. Nasal mucosa are pink and moist without lesions, exudates Neck:  No thyromegaly, masses, tenderness noted.    Heart:  No gallop, click, rub .  Lungs:  without wheezes, rhonchi,  rubs. Abdomen: Bowel sounds are normal. Abdomen is soft and nontender with no organomegaly, hernias, masses. GU: Deferred  Extremities:  No cyanosis, clubbing  Neurologic exam :Balance, Rhomberg, finger to nose testing could not be completed due to  clinical  state Skin: Warm & dry w/o tenting. No significant lesions or rash.  See summary under each active problem in the Problem List with associated updated therapeutic plan

## 2023-10-02 NOTE — Assessment & Plan Note (Signed)
 Vitamin D  level 25.99; 50,000 units supplement weekly.

## 2023-10-02 NOTE — Assessment & Plan Note (Addendum)
 Rhythm intermittently slightly irregular ; rate is slow mitigating against beta blocker for HTN. TSH is therapeutic on current Amiodarone  dose.

## 2023-10-02 NOTE — Assessment & Plan Note (Signed)
 Assess response to trial of hydrochlorothiazide.

## 2023-10-02 NOTE — Patient Instructions (Signed)
 See assessment and plan under each diagnosis in the problem list and acutely for this visit:  Essential hypertension Systolic BP range 139- 177 on vasodilating CCB. Rate is slow.  Most recent GFR indicates CKD stage IIIa.  Assess BP & edema response to hydrochlorothiazide.  If no response; recheck renal function.  Atrial fibrillation, chronic (HCC) Rhythm intermittently slightly irregular ; rate is slow mitigating against beta blocker for HTN. TSH is therapeutic on current Amiodarone  dose.  Bilateral edema of lower extremity Assess response to trial of hydrochlorothiazide.  Vitamin D  deficiency Vitamin D  level 25.99(30-100); 50,000 units supplement weekly ordered.

## 2023-10-11 DIAGNOSIS — I10 Essential (primary) hypertension: Secondary | ICD-10-CM | POA: Diagnosis not present

## 2023-10-17 DIAGNOSIS — L84 Corns and callosities: Secondary | ICD-10-CM | POA: Diagnosis not present

## 2023-10-17 DIAGNOSIS — L602 Onychogryphosis: Secondary | ICD-10-CM | POA: Diagnosis not present

## 2023-10-18 ENCOUNTER — Other Ambulatory Visit (HOSPITAL_COMMUNITY)
Admission: RE | Admit: 2023-10-18 | Discharge: 2023-10-18 | Disposition: A | Discharge status: Home / Self Care | Source: Skilled Nursing Facility | Attending: Adult Health | Admitting: Adult Health

## 2023-10-18 DIAGNOSIS — I10 Essential (primary) hypertension: Secondary | ICD-10-CM | POA: Diagnosis not present

## 2023-10-18 LAB — BASIC METABOLIC PANEL WITH GFR
Anion gap: 10 (ref 5–15)
BUN: 19 mg/dL (ref 8–23)
CO2: 26 mmol/L (ref 22–32)
Calcium: 9 mg/dL (ref 8.9–10.3)
Chloride: 103 mmol/L (ref 98–111)
Creatinine, Ser: 0.88 mg/dL (ref 0.44–1.00)
GFR, Estimated: >60 mL/min (ref >60)
Glucose, Bld: 79 mg/dL (ref 70–99)
Potassium: 3.5 mmol/L (ref 3.5–5.1)
Sodium: 139 mmol/L (ref 135–145)

## 2023-10-29 ENCOUNTER — Encounter: Payer: Self-pay | Admitting: Adult Health

## 2023-10-29 ENCOUNTER — Non-Acute Institutional Stay (SKILLED_NURSING_FACILITY): Payer: Self-pay | Admitting: Adult Health

## 2023-10-29 DIAGNOSIS — Z8673 Personal history of transient ischemic attack (TIA), and cerebral infarction without residual deficits: Secondary | ICD-10-CM | POA: Diagnosis not present

## 2023-10-29 DIAGNOSIS — I129 Hypertensive chronic kidney disease with stage 1 through stage 4 chronic kidney disease, or unspecified chronic kidney disease: Secondary | ICD-10-CM | POA: Diagnosis not present

## 2023-10-29 DIAGNOSIS — I482 Chronic atrial fibrillation, unspecified: Secondary | ICD-10-CM

## 2023-10-29 DIAGNOSIS — I495 Sick sinus syndrome: Secondary | ICD-10-CM | POA: Diagnosis not present

## 2023-10-29 DIAGNOSIS — N183 Chronic kidney disease, stage 3 unspecified: Secondary | ICD-10-CM

## 2023-10-29 NOTE — Progress Notes (Signed)
 Location:  [facility] Room Number: 118 Place of Service:  SNF (31)   CODE STATUS: dnr   Allergies  Allergen Reactions   Septra  Ds [Sulfamethoxazole -Trimethoprim ]    Lisinopril  Other (See Comments)    Cough    Chief Complaint  Patient presents with   Medical Management of Chronic Issues         History of CVA (cerebrovascular accident)  Atrial fibrillation/sick sinus syndrome: Hypertension associated with stage 3 chronic kidney disease    HPI:  She is a 89 y.o. long term resident of this facility being seen for the management of her chronic illnesses:History of CVA (cerebrovascular accident)  Atrial fibrillation/sick sinus syndrome: Hypertension associated with stage 3 chronic kidney disease. She is telling me today that her back is feeling good without pain. She states that she has a good appetite and is sleeping well at night.    Past Medical History:  Diagnosis Date   Acute metabolic encephalopathy 07/2021   Atrial fibrillation (HCC)    Bleeding stomach ulcer 1980s?   CVA (cerebral vascular accident) (HCC) 07/2021   DIVERTICULOSIS, COLON 11/22/2006   Headache(784.0)    "very often; not regular" (11/25/2015)   Heart block    hx of   Hip fracture (HCC) 07/2021   History of blood transfusion 1980s   "when I had the bleeding ulcer"   HYPERLIPIDEMIA 11/22/2006   Hypertension    Melanoma (HCC)    LLE   Menopausal syndrome (hot flashes)    OSTEOPOROSIS 11/22/2006   Presence of permanent cardiac pacemaker    Vitamin D  deficiency 08/21/2021    Past Surgical History:  Procedure Laterality Date   APPENDECTOMY     BUNIONECTOMY Right    CATARACT EXTRACTION W/ INTRAOCULAR LENS  IMPLANT, BILATERAL Bilateral 2000s   CHOLECYSTECTOMY OPEN     EP IMPLANTABLE DEVICE N/A 11/25/2015   Procedure: Pacemaker Implant;  Surgeon: Will Cortland Ding, MD;  Location: [facility] INVASIVE CV LAB;  Service: Cardiovascular;  Laterality: N/A;   FEMUR IM NAIL Left 07/08/2014    Procedure: INTRAMEDULLARY (IM) NAIL ;  Surgeon: Jasmine Mesi, MD;  Location: [facility] ORS;  Service: Orthopedics;  Laterality: Left;   FRACTURE SURGERY     HIP ARTHROPLASTY Right 08/20/2021   Procedure: ARTHROPLASTY BIPOLAR HIP (HEMIARTHROPLASTY);  Surgeon: Hardy Lia, MD;  Location: [facility] OR;  Service: Orthopedics;  Laterality: Right;   IR KYPHO LUMBAR INC FX REDUCE BONE BX UNI/BIL CANNULATION INC/IMAGING  03/21/2021   PACEMAKER INSERTION     REVISION TOTAL HIP ARTHROPLASTY Left 06/2014    Social History   Socioeconomic History   Marital status: Widowed    Spouse name: Not on file   Number of children: 2   Years of education: 69   Highest education level: Not on file  Occupational History    Comment: retired  Tobacco Use   Smoking status: Never    Passive exposure: Never   Smokeless tobacco: Never  Vaping Use   Vaping status: Never Used  Substance and Sexual Activity   Alcohol  use: No    Alcohol /week: 0.0 standard drinks of alcohol    Drug use: No   Sexual activity: Never  Other Topics Concern   Not on file  Social History Narrative   09/21/21 resides at [facility]      Lives alone, son lives beside her   Caffeine - none   Social Drivers of Health   Financial Resource Strain: Low Risk  (05/13/2020)   Overall Financial  Resource Strain (CARDIA)    Difficulty of Paying Living Expenses: Not hard at all  Food Insecurity: No Food Insecurity (05/13/2020)   Hunger Vital Sign    Worried About Running Out of Food in the Last Year: Never true    Ran Out of Food in the Last Year: Never true  Transportation Needs: No Transportation Needs (05/13/2020)   PRAPARE - Administrator, Civil Service (Medical): No    Lack of Transportation (Non-Medical): No  Physical Activity: Inactive (05/13/2020)   Exercise Vital Sign    Days of Exercise per Week: 0 days    Minutes of Exercise per Session: 0 min  Stress: No Stress Concern Present (05/13/2020)   [employer] of  Occupational Health - Occupational Stress Questionnaire    Feeling of Stress : Not at all  Social Connections: Socially Isolated (05/13/2020)   Social Connection and Isolation Panel [NHANES]    Frequency of Communication with Friends and Family: More than three times a week    Frequency of Social Gatherings with Friends and Family: More than three times a week    Attends Religious Services: Never    Database administrator or Organizations: No    Attends Banker Meetings: Never    Marital Status: Widowed  Intimate Partner Violence: Not At Risk (05/13/2020)   Humiliation, Afraid, Rape, and Kick questionnaire    Fear of Current or Ex-Partner: No    Emotionally Abused: No    Physically Abused: No    Sexually Abused: No   Family History  Problem Relation Age of Onset   Alzheimer's disease Sister    Cardiomyopathy Brother       VITAL SIGNS BP (!) 116/51   Pulse 60   Temp (!) 97 F (36.1 C)   Resp 18   Ht 4\' 11"  (1.499 m)   Wt 117 lb 6.4 oz (53.3 kg)   SpO2 97%   BMI 23.71 kg/m   Outpatient Encounter Medications as of 10/29/2023  Medication Sig   Acetaminophen  (TYLENOL  ARTHRITIS PAIN PO) Take 650 mg by mouth 3 (three) times daily.   amiodarone  (PACERONE ) 100 MG tablet Take 100 mg by mouth daily.   amLODipine (NORVASC) 10 MG tablet Take 10 mg by mouth daily.   aspirin  81 MG chewable tablet Chew 1 tablet (81 mg total) by mouth daily.   Camphor-Menthol -Methyl Sal (SALONPAS) 3.05-27-08 % PTCH Apply 1 patch topically daily. At 9 am   ergocalciferol  (VITAMIN D2) 1.25 MG (50000 UT) capsule Take 50,000 Units by mouth once a week.   feeding supplement (BOOST HIGH PROTEIN) LIQD Take 1 Container by mouth in the morning and at bedtime. 0.06 gram- 1 kcal/mL; amt:   methocarbamol  (ROBAXIN ) 500 MG tablet Take 250 mg by mouth 3 (three) times daily.   NON FORMULARY Diet - Dysphagia 3   ondansetron  (ZOFRAN -ODT) 4 MG disintegrating tablet Take 4 mg by mouth every 8 (eight) hours  as needed for nausea or vomiting.   senna-docusate (SENNA S) 8.6-50 MG tablet Take 1 tablet by mouth 2 (two) times daily.   No facility-administered encounter medications on file as of 10/29/2023.     SIGNIFICANT DIAGNOSTIC EXAMS  LABS REVIEWED; PREVIOUS    01-11-23: d-dimer: 0.40 04-03-23: wbc 10.5; hgb 12.7; hct 39.6; mcv 96.6 plt 292; glucose 124; bun 11; creat 0.82; k+ 3.3; na++ 137; ca 9.2; gfr >60; urine culture: e-coli: augmentin  04-09-23: k+ 3.9 04-10-23: wbc 7.4; hgb 14.0; hct 44.1; mcv 96.1 plt  256; glucose 126; bun 18; creat 0.97; k+ 3.6; na++ 134; ca 8.7; gfr 55 08-23-23: tsh 1.577; vitamin D  25.99    TODAY  10-18-23: glucose 79; bun 19; creat 0.88; k+ 3.5; na++ 139; ca 9.0; gfr >60    Review of Systems  Constitutional:  Negative for malaise/fatigue.  Respiratory:  Negative for cough and shortness of breath.   Cardiovascular:  Negative for chest pain, palpitations and leg swelling.  Gastrointestinal:  Negative for abdominal pain, constipation and heartburn.  Musculoskeletal:  Negative for back pain, joint pain and myalgias.  Skin: Negative.   Neurological:  Negative for dizziness.  Psychiatric/Behavioral:  The patient is not nervous/anxious.    Physical Exam Constitutional:      General: She is not in acute distress.    Appearance: She is well-developed. She is not diaphoretic.  Neck:     Thyroid : No thyromegaly.  Cardiovascular:     Rate and Rhythm: Normal rate and regular rhythm.     Pulses: Normal pulses.     Heart sounds: Normal heart sounds.  Pulmonary:     Effort: Pulmonary effort is normal. No respiratory distress.     Breath sounds: Normal breath sounds.  Abdominal:     General: Bowel sounds are normal. There is no distension.     Palpations: Abdomen is soft.     Tenderness: There is no abdominal tenderness.  Musculoskeletal:        General: Normal range of motion.     Cervical back: Neck supple.     Right lower leg: No edema.     Left lower leg: No  edema.  Lymphadenopathy:     Cervical: No cervical adenopathy.  Skin:    General: Skin is warm and dry.  Neurological:     Mental Status: She is alert. Mental status is at baseline.     Comments: SLUMS 10/30    Psychiatric:        Mood and Affect: Mood normal.    ASSESSMENT/ PLAN:  TODAY  History of CVA (cerebrovascular accident) is on asa 81 mg daily   2. Atrial fibrillation/sick sinus syndrome: is status post pasce maker. Will continue amiodarone  100 mg daily for rate control and asa 81 mg daily   3. Hypertension associated with stage 3 chronic kidney disease: b/p 116/51 will continue norvasc 10 mg daily   PREVIOUS   4. Gastroesophageal reflux disease without esophagitis: will monitor   5. Chronic kidney disease stage 3b: bun 19; creat 0.88; gfr >60  6. Anemia due to chronic kidney disease: hgb 14.0  7. Mixed hyperlipidemia: off statin due to advanced age.   8. Chronic low back pain: will continue robaxin  250 mg three times daily   9. Vitamin D  deficiency: level 25.99; on 50,000 units weekly   10. Vitamin B 12 deficiency: level 204   11. Aortic atherosclerosis (ct 08-02-21) is on asa; off statin due to advanced age  78. Post menopausal osteoporosis: t score -4.184; will monitor   13. Protein calorie malnutrition: albumin 3.1 will continue supplements  as directed.   Will check cbc; cmp   Britt Candle NP [facility]   call [phone]

## 2023-11-01 ENCOUNTER — Other Ambulatory Visit (HOSPITAL_COMMUNITY)
Admission: RE | Admit: 2023-11-01 | Discharge: 2023-11-01 | Disposition: A | Discharge status: Home / Self Care | Source: Skilled Nursing Facility | Attending: Adult Health | Admitting: Adult Health

## 2023-11-01 DIAGNOSIS — N1832 Chronic kidney disease, stage 3b: Secondary | ICD-10-CM | POA: Diagnosis not present

## 2023-11-01 LAB — COMPREHENSIVE METABOLIC PANEL WITH GFR
ALT: 9 U/L (ref 0–44)
AST: 14 U/L — ABNORMAL LOW (ref 15–41)
Albumin: 3.2 g/dL — ABNORMAL LOW (ref 3.5–5.0)
Alkaline Phosphatase: 79 U/L (ref 38–126)
Anion gap: 8 (ref 5–15)
BUN: 19 mg/dL (ref 8–23)
CO2: 24 mmol/L (ref 22–32)
Calcium: 8.9 mg/dL (ref 8.9–10.3)
Chloride: 107 mmol/L (ref 98–111)
Creatinine, Ser: 0.96 mg/dL (ref 0.44–1.00)
GFR, Estimated: 56 mL/min — ABNORMAL LOW (ref >60)
Glucose, Bld: 82 mg/dL (ref 70–99)
Potassium: 3.7 mmol/L (ref 3.5–5.1)
Sodium: 139 mmol/L (ref 135–145)
Total Bilirubin: 0.5 mg/dL (ref 0.0–1.2)
Total Protein: 6.1 g/dL — ABNORMAL LOW (ref 6.5–8.1)

## 2023-11-01 LAB — CBC
HCT: 35.2 % — ABNORMAL LOW (ref 36.0–46.0)
Hemoglobin: 11 g/dL — ABNORMAL LOW (ref 12.0–15.0)
MCH: 30.1 pg (ref 26.0–34.0)
MCHC: 31.3 g/dL (ref 30.0–36.0)
MCV: 96.2 fL (ref 80.0–100.0)
Platelets: 245 10*3/uL (ref 150–400)
RBC: 3.66 MIL/uL — ABNORMAL LOW (ref 3.87–5.11)
RDW: 13.2 % (ref 11.5–15.5)
WBC: 4.8 10*3/uL (ref 4.0–10.5)
nRBC: 0 % (ref 0.0–0.2)

## 2023-11-08 ENCOUNTER — Non-Acute Institutional Stay (SKILLED_NURSING_FACILITY): Payer: Self-pay | Admitting: Adult Health

## 2023-11-08 ENCOUNTER — Encounter: Payer: Self-pay | Admitting: Adult Health

## 2023-11-08 DIAGNOSIS — Z0000 Encounter for general adult medical examination without abnormal findings: Secondary | ICD-10-CM

## 2023-11-12 NOTE — Progress Notes (Signed)
 Subjective:   [name] Small is a 89 y.o. female who presents for Medicare Annual (Subsequent) preventive examination.  Visit Complete: In person  Patient Medicare AWV questionnaire was completed by the patient on 11/08/2023; I have confirmed that all information answered by patient is correct and no changes since this date.  Cardiac Risk Factors include: advanced age (>22men, >53 women);sedentary lifestyle     Objective:    Today's Vitals   11/08/23 0916  BP: (!) 137/56  Pulse: (!) 52  Resp: 18  Temp: 98.7 F (37.1 C)  SpO2: 96%  Weight: 116 lb 9.6 oz (52.9 kg)  Height: 4' 11 (1.499 m)   Body mass index is 23.55 kg/m.     04/10/2023    3:06 PM 02/08/2023   11:23 AM 10/27/2022   11:31 AM 07/18/2022    2:45 PM 07/05/2022    9:59 AM 06/12/2022   11:31 AM 05/19/2022    3:31 PM  Advanced Directives  Does Patient Have a Medical Advance Directive? Yes No Yes Yes Yes Yes Yes  Type of Forensic scientist of [place];Out of facility DNR (pink MOST or yellow form) Healthcare Power of [place];Out of facility DNR (pink MOST or yellow form) Healthcare Power of [place];Out of facility DNR (pink MOST or yellow form) Healthcare Power of [place];Out of facility DNR (pink MOST or yellow form) Healthcare Power of [place];Out of facility DNR (pink MOST or yellow form)  Does patient want to make changes to medical advance directive? No - Patient declined  No - Patient declined No - Patient declined No - Patient declined No - Patient declined No - Patient declined  Copy of Healthcare Power of Attorney in Chart? Yes - validated most recent copy scanned in chart (See row information)  Yes - validated most recent copy scanned in chart (See row information) Yes - validated most recent copy scanned in chart (See row information) Yes - validated most recent copy scanned in chart (See row information) Yes - validated most recent copy scanned in chart (See row  information) Yes - validated most recent copy scanned in chart (See row information)  Would patient like information on creating a medical advance directive?  No - Patient declined       Pre-existing out of facility DNR order (yellow form or pink MOST form)   [place]ink MOST form placed in chart (order not valid for inpatient use) [place] MOST form placed in chart (order not valid for inpatient use) Pink MOST form placed in chart (order not valid for inpatient use) Pink MOST form placed in chart (order not valid for inpatient use) Pink MOST form placed in chart (order not valid for inpatient use)    Current Medications (verified) Outpatient Encounter Medications as of 11/08/2023  Medication Sig   Acetaminophen  (TYLENOL  ARTHRITIS PAIN PO) Take 650 mg by mouth 3 (three) times daily.   amiodarone  (PACERONE ) 100 MG tablet Take 100 mg by mouth daily.   amLODipine (NORVASC) 10 MG tablet Take 10 mg by mouth daily.   aspirin  81 MG chewable tablet Chew 1 tablet (81 mg total) by mouth daily.   Camphor-Menthol -Methyl Sal (SALONPAS) 3.05-27-08 % PTCH Apply 1 patch topically daily. At 9 am   ergocalciferol  (VITAMIN D2) 1.25 MG (50000 UT) capsule Take 50,000 Units by mouth once a week.   feeding supplement (BOOST HIGH PROTEIN) LIQD Take 1 Container by mouth in the morning and at bedtime. 0.06 gram- 1 kcal/mL; amt:  methocarbamol  (ROBAXIN ) 500 MG tablet Take 250 mg by mouth 3 (three) times daily.   NON FORMULARY Diet - Dysphagia 3   ondansetron  (ZOFRAN -ODT) 4 MG disintegrating tablet Take 4 mg by mouth every 8 (eight) hours as needed for nausea or vomiting.   senna-docusate (SENNA S) 8.6-50 MG tablet Take 1 tablet by mouth 2 (two) times daily.   No facility-administered encounter medications on file as of 11/08/2023.    Allergies (verified) Septra  ds [sulfamethoxazole -trimethoprim ] and Lisinopril    History: Past Medical History:  Diagnosis Date   Acute metabolic encephalopathy 07/2021   Atrial fibrillation  (HCC)    Bleeding stomach ulcer 1980s?   CVA (cerebral vascular accident) (HCC) 07/2021   DIVERTICULOSIS, COLON 11/22/2006   Headache(784.0)    very often; not regular (11/25/2015)   Heart block    hx of   Hip fracture (HCC) 07/2021   History of blood transfusion 1980s   when I had the bleeding ulcer   HYPERLIPIDEMIA 11/22/2006   Hypertension    Melanoma (HCC)    LLE   Menopausal syndrome (hot flashes)    OSTEOPOROSIS 11/22/2006   Presence of permanent cardiac pacemaker    Vitamin D  deficiency 08/21/2021   Past Surgical History:  Procedure Laterality Date   APPENDECTOMY     BUNIONECTOMY Right    CATARACT EXTRACTION W/ INTRAOCULAR LENS  IMPLANT, BILATERAL Bilateral 2000s   CHOLECYSTECTOMY OPEN     EP IMPLANTABLE DEVICE N/A 11/25/2015   Procedure: Pacemaker Implant;  Surgeon: Will Gladis Norton, MD;  Location: [facility];  Service: Cardiovascular;  Laterality: N/A;   FEMUR IM NAIL Left 07/08/2014   Procedure: INTRAMEDULLARY (IM) NAIL ;  Surgeon: Cordella Glendia Hutchinson, MD;  Location: [facility] ORS;  Service: Orthopedics;  Laterality: Left;   FRACTURE SURGERY     HIP ARTHROPLASTY Right 08/20/2021   Procedure: ARTHROPLASTY BIPOLAR HIP (HEMIARTHROPLASTY);  Surgeon: Celena Sharper, MD;  Location: [facility];  Service: Orthopedics;  Laterality: Right;   IR KYPHO LUMBAR INC FX REDUCE BONE BX UNI/BIL CANNULATION INC/IMAGING  03/21/2021   PACEMAKER INSERTION     REVISION TOTAL HIP ARTHROPLASTY Left 06/2014   Family History  Problem Relation Age of Onset   Alzheimer's disease Sister    Cardiomyopathy Brother    Social History   Socioeconomic History   Marital status: Widowed    Spouse name: Not on file   Number of children: 2   Years of education: 110   Highest education level: Not on file  Occupational History    Comment: retired  Tobacco Use   Smoking status: Never    Passive exposure: Never   Smokeless tobacco: Never  Vaping Use   Vaping status: Never Used  Substance and  Sexual Activity   Alcohol  use: No    Alcohol /week: 0.0 standard drinks of alcohol    Drug use: No   Sexual activity: Never  Other Topics Concern   Not on file  Social History Narrative   09/21/21 resides at [facility]      Lives alone, son lives beside her   Caffeine - none   Social Drivers of Health   Financial Resource Strain: Low Risk  (05/13/2020)   Overall Financial Resource Strain (CARDIA)    Difficulty of Paying Living Expenses: Not hard at all  Food Insecurity: No Food Insecurity (05/13/2020)   Hunger Vital Sign    Worried About Running Out of Food in the Last Year: Never true    Ran Out of Food in the  Last Year: Never true  Transportation Needs: No Transportation Needs (05/13/2020)   PRAPARE - Administrator, Civil Service (Medical): No    Lack of Transportation (Non-Medical): No  Physical Activity: Inactive (05/13/2020)   Exercise Vital Sign    Days of Exercise per Week: 0 days    Minutes of Exercise per Session: 0 min  Stress: No Stress Concern Present (05/13/2020)   [employer] of Occupational Health - Occupational Stress Questionnaire    Feeling of Stress : Not at all  Social Connections: Socially Isolated (05/13/2020)   Social Connection and Isolation Panel    Frequency of Communication with Friends and Family: More than three times a week    Frequency of Social Gatherings with Friends and Family: More than three times a week    Attends Religious Services: Never    Database administrator or Organizations: No    Attends Banker Meetings: Never    Marital Status: Widowed    Tobacco Counseling Counseling given: Not Answered   Clinical Intake:  Pre-visit preparation completed: Yes  Pain : No/denies pain     BMI - recorded: 23.55 Nutritional Status: BMI of 19-24  Normal Nutritional Risks: Unintentional weight loss, Failure to thrive Diabetes: No  How often do you need to have someone help you when you read instructions,  pamphlets, or other written materials from your doctor or pharmacy?: 5 - Always  Interpreter Needed?: No      Activities of Daily Living    11/12/2023    2:16 PM  In your present state of health, do you have any difficulty performing the following activities:  Hearing? 1  Vision? 0  Difficulty concentrating or making decisions? 1  Walking or climbing stairs? 1  Dressing or bathing? 1  Doing errands, shopping? 1  Preparing Food and eating ? Y  Using the Toilet? Y  In the past six months, have you accidently leaked urine? Y  Do you have problems with loss of bowel control? Y  Managing your Medications? Y  Managing your Finances? Y  Housekeeping or managing your Housekeeping? Y    Patient Care Team: Landy Barnie RAMAN, NP as PCP - General (Geriatric Medicine) Delford Maude BROCKS, MD as PCP - Cardiology (Cardiology) Inocencio Soyla Lunger, MD as PCP - Electrophysiology (Cardiology)  Indicate any recent Medical Services you may have received from other than [facility] providers in the past year (date may be approximate).     Assessment:   This is a routine wellness examination for Gracianna.  Hearing/Vision screen No results found.   Goals Addressed             This Visit's Progress    Absence of Fall and Fall-Related Injury   On track    Evidence-based guidance:  Assess fall risk using a validated tool when available. Consider balance and gait impairment, muscle weakness, diminished vision or hearing, environmental hazards, presence of urinary or bowel urgency and/or incontinence.  Communicate fall injury risk to interprofessional healthcare team.  Develop a fall prevention plan with the patient and family.  Promote use of personal vision and auditory aids.  Promote reorientation, appropriate sensory stimulation, and routines to decrease risk of fall when changes in mental status are present.  Assess assistance level required for safe and effective self-care; consider referral for home  care.  Encourage physical activity, such as performance of self-care at highest level of ability, strength and balance exercise program, and provision of appropriate assistive devices;  refer to rehabilitation therapy.  Refer to community-based fall prevention program where available.  If fall occurs, determine the cause and revise fall injury prevention plan.  Regularly review medication contribution to fall risk; consider risk related to polypharmacy and age.  Refer to pharmacist for consultation when concerns about medications are revealed.  Balance adequate pain management with potential for oversedation.  Provide guidance related to environmental modifications.  Consider supplementation with Vitamin D .   Notes:      DIET - INCREASE WATER INTAKE   On track    General - Client will not be readmitted within 30 days (C-SNP)   On track      Depression Screen    11/12/2023    2:18 PM 06/08/2023   12:31 PM 05/14/2023    2:39 PM 05/14/2023    2:37 PM 11/06/2022   10:57 AM 07/18/2022    2:42 PM 06/19/2022    9:07 AM  PHQ 2/9 Scores  PHQ - 2 Score 0 0 0 0 0 0 0  PHQ- 9 Score  0 0  0  0    Fall Risk    11/12/2023    2:18 PM 06/08/2023   12:31 PM 05/14/2023    2:37 PM 11/06/2022   10:56 AM 07/18/2022    2:41 PM  Fall Risk   Falls in the past year? 1 0 0 1 0  Number falls in past yr: 0 0 0 0 0  Injury with Fall? 0 0 0 0 0  Risk for fall due to : History of fall(s);Impaired balance/gait;Impaired mobility Impaired balance/gait;Impaired mobility Impaired balance/gait;Impaired mobility History of fall(s);Impaired balance/gait;Impaired mobility History of fall(s);Impaired balance/gait;Impaired mobility  Follow up   Falls evaluation completed  Falls evaluation completed    MEDICARE RISK AT HOME: Medicare Risk at Home Any stairs in or around the home?: Yes If so, are there any without handrails?: No Home free of loose throw rugs in walkways, pet beds, electrical cords, etc?: Yes Adequate  lighting in your home to reduce risk of falls?: Yes Life alert?: No Use of a cane, walker or w/c?: Yes Grab bars in the bathroom?: Yes Shower chair or bench in shower?: Yes Elevated toilet seat or a handicapped toilet?: Yes  TIMED UP AND GO:  Was the test performed?  No    Cognitive Function:    11/12/2023    2:19 PM 11/06/2022   11:01 AM 11/02/2021    2:34 PM  MMSE - Mini Mental State Exam  Not completed: Unable to complete  Unable to complete  Orientation to time  2   Orientation to Place  3   Registration  3   Attention/ Calculation  3   Recall  2   Language- name 2 objects  2   Language- repeat  1   Language- follow 3 step command  2   Language- read & follow direction  1   Write a sentence  0   Copy design  0   Total score  19         11/06/2022   11:01 AM 05/13/2020    9:57 AM  6CIT Screen  What Year? 0 points 0 points  What month? 0 points 0 points  What time? 0 points 0 points  Count back from 20 4 points 0 points  Months in reverse 4 points 4 points  Repeat phrase 4 points 2 points  Total Score 12 points 6 points    Immunizations Immunization History  Administered Date(s)  Administered   Fluad Quad(high Dose 65+) 03/05/2019, 03/23/2021, 03/12/2023   Influenza Split 03/02/2011, 03/14/2012   Influenza Whole 03/21/2007, 02/24/2008, 02/18/2009, 01/28/2010   Influenza, High Dose Seasonal PF 04/03/2013, 03/10/2014, 03/30/2015, 02/29/2016, 02/06/2017, 02/27/2018   Influenza-Unspecified 03/15/2022   Moderna Covid-19 Fall Seasonal Vaccine 43yrs & older 02/27/2023   Moderna Sars-Covid-2 Vaccination 03/15/2022   PFIZER(Purple Top)SARS-COV-2 Vaccination 07/04/2019, 07/27/2019   Pfizer Covid-19 Vaccine Bivalent Booster 53yrs & up 10/11/2021   Pneumococcal Conjugate-13 04/07/2014   Pneumococcal Polysaccharide-23 10/18/2010   Rsv, Bivalent, Protein Subunit Rsvpref,pf [place]) 04/24/2022   Tdap 10/18/2010, 11/11/2021   Zoster Recombinant(Shingrix) 09/16/2021,  12/14/2021    TDAP status: Up to date  Flu Vaccine status: Up to date  Pneumococcal vaccine status: Up to date  Covid-19 vaccine status: Completed vaccines  Qualifies for Shingles Vaccine? No   Zostavax completed Yes   Shingrix Completed?: Yes  Screening Tests Health Maintenance  Topic Date Due   Medicare Annual Wellness (AWV)  11/06/2023   COVID-19 Vaccine (6 - 2024-25 season) 05/13/2024 (Originally 08/28/2023)   INFLUENZA VACCINE  12/21/2023   DTaP/Tdap/Td (3 - Td or Tdap) 11/12/2031   Pneumococcal Vaccine: 50+ Years  Completed   DEXA SCAN  Completed   Zoster Vaccines- Shingrix  Completed   HPV VACCINES  Aged Out   Meningococcal B Vaccine  Aged Out   MAMMOGRAM  Discontinued    Health Maintenance  Health Maintenance Due  Topic Date Due   Medicare Annual Wellness (AWV)  11/06/2023    Colorectal cancer screening: No longer required.   Mammogram status: No longer required due to  .    Lung Cancer Screening: (Low Dose CT Chest recommended if Age 44-80 years, 20 pack-year currently smoking OR have quit w/in 15years.) does not qualify.   Lung Cancer Screening Referral:   Additional Screening:  Hepatitis C Screening: does not qualify; Completed   Vision Screening: Recommended annual ophthalmology exams for early detection of glaucoma and other disorders of the eye. Is the patient up to date with their annual eye exam?  Yes  Who is the provider or what is the name of the office in which the patient attends annual eye exams?  If pt is not established with a provider, would they like to be referred to a provider to establish care? Yes .   Dental Screening: Recommended annual dental exams for proper oral hygiene  Diabetic Foot Exam:   Community Resource Referral / Chronic Care Management: CRR required this visit?  No   CCM required this visit?  No     Plan:     I have personally reviewed and noted the following in the patient's chart:   Medical and social  history Use of alcohol , tobacco or illicit drugs  Current medications and supplements including opioid prescriptions. Patient is not currently taking opioid prescriptions. Functional ability and status Nutritional status Physical activity Advanced directives List of other physicians Hospitalizations, surgeries, and ER visits in previous 12 months Vitals Screenings to include cognitive, depression, and falls Referrals and appointments  In addition, I have reviewed and discussed with patient certain preventive protocols, quality metrics, and best practice recommendations. A written personalized care plan for preventive services as well as general preventive health recommendations were provided to patient.     Barnie GORMAN Seip, NP   11/12/2023   After Visit Summary: (In Person-Declined) Patient declined AVS at this time.  Nurse Notes: this exam was performed by myself at this facility

## 2023-11-12 NOTE — Patient Instructions (Signed)
  [name] Small , Thank you for taking time to come for your Medicare Wellness Visit. I appreciate your ongoing commitment to your health goals. Please review the following plan we discussed and let me know if I can assist you in the future.   These are the goals we discussed:  Goals      Absence of Fall and Fall-Related Injury     Evidence-based guidance:  Assess fall risk using a validated tool when available. Consider balance and gait impairment, muscle weakness, diminished vision or hearing, environmental hazards, presence of urinary or bowel urgency and/or incontinence.  Communicate fall injury risk to interprofessional healthcare team.  Develop a fall prevention plan with the patient and family.  Promote use of personal vision and auditory aids.  Promote reorientation, appropriate sensory stimulation, and routines to decrease risk of fall when changes in mental status are present.  Assess assistance level required for safe and effective self-care; consider referral for home care.  Encourage physical activity, such as performance of self-care at highest level of ability, strength and balance exercise program, and provision of appropriate assistive devices; refer to rehabilitation therapy.  Refer to community-based fall prevention program where available.  If fall occurs, determine the cause and revise fall injury prevention plan.  Regularly review medication contribution to fall risk; consider risk related to polypharmacy and age.  Refer to pharmacist for consultation when concerns about medications are revealed.  Balance adequate pain management with potential for oversedation.  Provide guidance related to environmental modifications.  Consider supplementation with Vitamin D .   Notes:      DIET - INCREASE WATER INTAKE     General - Client will not be readmitted within 30 days (C-SNP)        This is a list of the screening recommended for you and due dates:  Health Maintenance  Topic  Date Due   Medicare Annual Wellness Visit  11/06/2023   COVID-19 Vaccine (6 - 2024-25 season) 05/13/2024*   Flu Shot  12/21/2023   DTaP/Tdap/Td vaccine (3 - Td or Tdap) 11/12/2031   Pneumococcal Vaccine for age over 27  Completed   DEXA scan (bone density measurement)  Completed   Zoster (Shingles) Vaccine  Completed   HPV Vaccine  Aged Out   Meningitis B Vaccine  Aged Out   Mammogram  Discontinued  *Topic was postponed. The date shown is not the original due date.

## 2023-11-19 DIAGNOSIS — I10 Essential (primary) hypertension: Secondary | ICD-10-CM | POA: Diagnosis not present

## 2023-11-26 ENCOUNTER — Non-Acute Institutional Stay (SKILLED_NURSING_FACILITY): Payer: Self-pay | Admitting: Adult Health

## 2023-11-26 DIAGNOSIS — D631 Anemia in chronic kidney disease: Secondary | ICD-10-CM

## 2023-11-26 DIAGNOSIS — N1832 Chronic kidney disease, stage 3b: Secondary | ICD-10-CM | POA: Diagnosis not present

## 2023-11-26 DIAGNOSIS — K219 Gastro-esophageal reflux disease without esophagitis: Secondary | ICD-10-CM | POA: Diagnosis not present

## 2023-11-30 ENCOUNTER — Encounter: Payer: Self-pay | Admitting: Adult Health

## 2023-11-30 NOTE — Progress Notes (Unsigned)
 Location:  [facility] Room Number: 118 Place of Service:  SNF (31)   CODE STATUS: dnr   Allergies  Allergen Reactions   Septra  Ds [Sulfamethoxazole -Trimethoprim ]    Lisinopril  Other (See Comments)    Cough    Chief Complaint  Patient presents with   Medical Management of Chronic Issues           Gastroesophageal reflux disease without esophagitis: Chronic kidney disease stage 3b: Anemia due to chronic kidney disease    HPI:  She is a 89 y.o. long term resident of this facility being seen for the management of her chronic illnesses: Gastroesophageal reflux disease without esophagitis: Chronic kidney disease stage 3b: Anemia due to chronic kidney disease. There are no reports of uncontrolled back pain. She continues to socialize with others. Her weight remains stable.    Past Medical History:  Diagnosis Date   Acute metabolic encephalopathy 07/2021   Atrial fibrillation (HCC)    Bleeding stomach ulcer 1980s?   CVA (cerebral vascular accident) (HCC) 07/2021   DIVERTICULOSIS, COLON 11/22/2006   Headache(784.0)    very often; not regular (11/25/2015)   Heart block    hx of   Hip fracture (HCC) 07/2021   History of blood transfusion 1980s   when I had the bleeding ulcer   HYPERLIPIDEMIA 11/22/2006   Hypertension    Melanoma (HCC)    LLE   Menopausal syndrome (hot flashes)    OSTEOPOROSIS 11/22/2006   Presence of permanent cardiac pacemaker    Vitamin D  deficiency 08/21/2021    Past Surgical History:  Procedure Laterality Date   APPENDECTOMY     BUNIONECTOMY Right    CATARACT EXTRACTION W/ INTRAOCULAR LENS  IMPLANT, BILATERAL Bilateral 2000s   CHOLECYSTECTOMY OPEN     EP IMPLANTABLE DEVICE N/A 11/25/2015   Procedure: Pacemaker Implant;  Surgeon: Will Gladis Norton, MD;  [facility]: [facility];  Service: Cardiovascular;  Laterality: N/A;   FEMUR IM NAIL Left 07/08/2014   Procedure: INTRAMEDULLARY (IM) NAIL ;  Surgeon: Cordella Glendia Hutchinson, MD;  Location: [facility] ORS;  Service: Orthopedics;  Laterality: Left;   FRACTURE SURGERY     HIP ARTHROPLASTY Right 08/20/2021   Procedure: ARTHROPLASTY BIPOLAR HIP (HEMIARTHROPLASTY);  Surgeon: Celena Sharper, MD;  Location: [facility];  Service: Orthopedics;  Laterality: Right;   IR KYPHO LUMBAR INC FX REDUCE BONE BX UNI/BIL CANNULATION INC/IMAGING  03/21/2021   PACEMAKER INSERTION     REVISION TOTAL HIP ARTHROPLASTY Left 06/2014    Social History   Socioeconomic History   Marital status: Widowed    Spouse name: Not on file   Number of children: 2   Years of education: 88   Highest education level: Not on file  Occupational History    Comment: retired  Tobacco Use   Smoking status: Never    Passive exposure: Never   Smokeless tobacco: Never  Vaping Use   Vaping status: Never Used  Substance and Sexual Activity   Alcohol  use: No    Alcohol /week: 0.0 standard drinks of alcohol    Drug use: No   Sexual activity: Never  Other Topics Concern   Not on file  Social History Narrative   09/21/21 resides at [facility]      Lives alone, son lives beside her   Caffeine - none   Social Drivers of Health   Financial Resource Strain: Low Risk  (05/13/2020)   Overall Financial Resource Strain (CARDIA)    Difficulty of Paying Living  Expenses: Not hard at all  Food Insecurity: No Food Insecurity (05/13/2020)   Hunger Vital Sign    Worried About Running Out of Food in the Last Year: Never true    Ran Out of Food in the Last Year: Never true  Transportation Needs: No Transportation Needs (05/13/2020)   PRAPARE - Administrator, Civil Service (Medical): No    Lack of Transportation (Non-Medical): No  Physical Activity: Inactive (05/13/2020)   Exercise Vital Sign    Days of Exercise per Week: 0 days    Minutes of Exercise per Session: 0 min  Stress: No Stress Concern Present (05/13/2020)   [employer] of Occupational Health - Occupational Stress Questionnaire    Feeling  of Stress : Not at all  Social Connections: Socially Isolated (05/13/2020)   Social Connection and Isolation Panel    Frequency of Communication with Friends and Family: More than three times a week    Frequency of Social Gatherings with Friends and Family: More than three times a week    Attends Religious Services: Never    Database administrator or Organizations: No    Attends Banker Meetings: Never    Marital Status: Widowed  Intimate Partner Violence: Not At Risk (05/13/2020)   Humiliation, Afraid, Rape, and Kick questionnaire    Fear of Current or Ex-Partner: No    Emotionally Abused: No    Physically Abused: No    Sexually Abused: No   Family History  Problem Relation Age of Onset   Alzheimer's disease Sister    Cardiomyopathy Brother       VITAL SIGNS BP 138/70   Pulse 66   Temp (!) 97.4 F (36.3 C)   Resp 18   Ht 4' 11 (1.499 m)   Wt 118 lb 8 oz (53.8 kg)   SpO2 100%   BMI 23.93 kg/m   Outpatient Encounter Medications as of 11/26/2023  Medication Sig   Acetaminophen  (TYLENOL  ARTHRITIS PAIN PO) Take 650 mg by mouth 3 (three) times daily.   amiodarone  (PACERONE ) 100 MG tablet Take 100 mg by mouth daily.   amLODipine (NORVASC) 10 MG tablet Take 10 mg by mouth daily.   aspirin  81 MG chewable tablet Chew 1 tablet (81 mg total) by mouth daily.   Camphor-Menthol -Methyl Sal (SALONPAS) 3.05-27-08 % PTCH Apply 1 patch topically daily. At 9 am   ergocalciferol  (VITAMIN D2) 1.25 MG (50000 UT) capsule Take 50,000 Units by mouth once a week.   feeding supplement (BOOST HIGH PROTEIN) LIQD Take 1 Container by mouth in the morning and at bedtime. 0.06 gram- 1 kcal/mL; amt:   methocarbamol  (ROBAXIN ) 500 MG tablet Take 250 mg by mouth 3 (three) times daily.   NON FORMULARY Diet - Dysphagia 3   ondansetron  (ZOFRAN -ODT) 4 MG disintegrating tablet Take 4 mg by mouth every 8 (eight) hours as needed for nausea or vomiting.   senna-docusate (SENNA S) 8.6-50 MG tablet  Take 1 tablet by mouth 2 (two) times daily.   No facility-administered encounter medications on file as of 11/26/2023.     SIGNIFICANT DIAGNOSTIC EXAMS  LABS REVIEWED; PREVIOUS    01-11-23: d-dimer: 0.40 04-03-23: wbc 10.5; hgb 12.7; hct 39.6; mcv 96.6 plt 292; glucose 124; bun 11; creat 0.82; k+ 3.3; na++ 137; ca 9.2; gfr >60; urine culture: e-coli: augmentin  04-09-23: k+ 3.9 04-10-23: wbc 7.4; hgb 14.0; hct 44.1; mcv 96.1 plt 256; glucose 126; bun 18; creat 0.97; k+ 3.6; na++ 134; ca  8.7; gfr 55 08-23-23: tsh 1.577; vitamin D  25.99    TODAY  10-18-23: glucose 79; bun 19; creat 0.88; k+ 3.5; na++ 139; ca 9.0; gfr >60  11-01-23: wbc 4.8; hgb 11.0; hct 35.2; mcv 96.2 plt 245; glucose 82; bun 19; creat 0.96; k+ 3.7; na++ 139; ca 8.9; gfr 56; protein 6.1 albumin 3.2    Review of Systems  Constitutional:  Negative for malaise/fatigue.  Respiratory:  Negative for cough and shortness of breath.   Cardiovascular:  Negative for chest pain, palpitations and leg swelling.  Gastrointestinal:  Negative for abdominal pain, constipation and heartburn.  Musculoskeletal:  Negative for back pain, joint pain and myalgias.  Skin: Negative.   Neurological:  Negative for dizziness.  Psychiatric/Behavioral:  The patient is not nervous/anxious.    Physical Exam Constitutional:      General: She is not in acute distress.    Appearance: She is well-developed. She is not diaphoretic.  Neck:     Thyroid : No thyromegaly.  Cardiovascular:     Rate and Rhythm: Normal rate and regular rhythm.     Pulses: Normal pulses.     Heart sounds: Normal heart sounds.  Pulmonary:     Effort: Pulmonary effort is normal. No respiratory distress.     Breath sounds: Normal breath sounds.  Abdominal:     General: Bowel sounds are normal. There is no distension.     Palpations: Abdomen is soft.     Tenderness: There is no abdominal tenderness.  Musculoskeletal:        General: Normal range of motion.     Cervical back:  Neck supple.     Right lower leg: No edema.     Left lower leg: No edema.  Lymphadenopathy:     Cervical: No cervical adenopathy.  Skin:    General: Skin is warm and dry.  Neurological:     Mental Status: She is alert. Mental status is at baseline.     Comments: SLUMS 10/30   Psychiatric:        Mood and Affect: Mood normal.      ASSESSMENT/ PLAN:  TODAY  Gastroesophageal reflux disease without esophagitis: will monitor   2. Chronic kidney disease stage 3b: bun 19; creat 0.96; gfr 56  3. Anemia due to chronic kidney disease: hgb 11.0   PREVIOUS   4. Mixed hyperlipidemia: off statin due to advanced age.   5. Chronic low back pain: will continue robaxin  250 mg three times daily   6. Vitamin D  deficiency: level 25.99; on 50,000 units weekly   7. Vitamin B 12 deficiency: level 204   8. Aortic atherosclerosis (ct 08-02-21) is on asa; off statin due to advanced age  33. Post menopausal osteoporosis: t score -4.184; will monitor   10. Protein calorie malnutrition: albumin 3.1 will continue supplements  as directed.   11. History of CVA (cerebrovascular accident) is on asa 81 mg daily   12. Atrial fibrillation/sick sinus syndrome: is status post pace maker. Will continue amiodarone  100 mg daily for rate control and asa 81 mg daily   13. Hypertension associated with stage 3 chronic kidney disease: b/p 138/70 will continue norvasc 10 mg daily      Barnie Seip NP [facility]   call [phone]

## 2023-12-07 ENCOUNTER — Non-Acute Institutional Stay (SKILLED_NURSING_FACILITY): Payer: Self-pay | Admitting: Adult Health

## 2023-12-07 ENCOUNTER — Encounter: Payer: Self-pay | Admitting: Adult Health

## 2023-12-07 DIAGNOSIS — R4189 Other symptoms and signs involving cognitive functions and awareness: Secondary | ICD-10-CM

## 2023-12-07 DIAGNOSIS — R29818 Other symptoms and signs involving the nervous system: Secondary | ICD-10-CM

## 2023-12-07 DIAGNOSIS — N1832 Chronic kidney disease, stage 3b: Secondary | ICD-10-CM | POA: Diagnosis not present

## 2023-12-07 DIAGNOSIS — I700 Atherosclerosis of aorta: Secondary | ICD-10-CM | POA: Diagnosis not present

## 2023-12-07 NOTE — Progress Notes (Signed)
 Location:  [facility] Room Number: 121/D Place of Service:  SNF (31)   CODE STATUS: dnr   Allergies  Allergen Reactions   Septra  Ds [Sulfamethoxazole -Trimethoprim ]    Lisinopril  Other (See Comments)    Cough    Chief Complaint  Patient presents with   Care plan meeting        HPI:  We have come together for her care plan meeting. BIMS 8/15 mood 0/30. Is out of bed to wheelchair without falls. She requires moderate to dependent assist with her adl care. She is frequently incontinent of bladder and bowel. Dietary: feeds self appetite >50% of meals regular diet weight is 120.6 pounds. Therapy: none at this time. Activities: does participate. She will continue to be followed for her chronic illnesses including: Aortic atherosclerosis   Chronic kidney disease stage 3b   Neurocognitive deficits  Past Medical History:  Diagnosis Date   Acute metabolic encephalopathy 07/2021   Atrial fibrillation (HCC)    Bleeding stomach ulcer 1980s?   CVA (cerebral vascular accident) (HCC) 07/2021   DIVERTICULOSIS, COLON 11/22/2006   Headache(784.0)    very often; not regular (11/25/2015)   Heart block    hx of   Hip fracture (HCC) 07/2021   History of blood transfusion 1980s   when I had the bleeding ulcer   HYPERLIPIDEMIA 11/22/2006   Hypertension    Melanoma (HCC)    LLE   Menopausal syndrome (hot flashes)    OSTEOPOROSIS 11/22/2006   Presence of permanent cardiac pacemaker    Vitamin D  deficiency 08/21/2021    Past Surgical History:  Procedure Laterality Date   APPENDECTOMY     BUNIONECTOMY Right    CATARACT EXTRACTION W/ INTRAOCULAR LENS  IMPLANT, BILATERAL Bilateral 2000s   CHOLECYSTECTOMY OPEN     EP IMPLANTABLE DEVICE N/A 11/25/2015   Procedure: Pacemaker Implant;  Surgeon: Will Gladis Norton, MD;  Location: [facility] INVASIVE CV LAB;  Service: Cardiovascular;  Laterality: N/A;   FEMUR IM NAIL Left 07/08/2014   Procedure: INTRAMEDULLARY (IM) NAIL ;   Surgeon: Cordella Glendia Hutchinson, MD;  Location: [facility] ORS;  Service: Orthopedics;  Laterality: Left;   FRACTURE SURGERY     HIP ARTHROPLASTY Right 08/20/2021   Procedure: ARTHROPLASTY BIPOLAR HIP (HEMIARTHROPLASTY);  Surgeon: Celena Sharper, MD;  Location: [facility] OR;  Service: Orthopedics;  Laterality: Right;   IR KYPHO LUMBAR INC FX REDUCE BONE BX UNI/BIL CANNULATION INC/IMAGING  03/21/2021   PACEMAKER INSERTION     REVISION TOTAL HIP ARTHROPLASTY Left 06/2014    Social History   Socioeconomic History   Marital status: Widowed    Spouse name: Not on file   Number of children: 2   Years of education: 14   Highest education level: Not on file  Occupational History    Comment: retired  Tobacco Use   Smoking status: Never    Passive exposure: Never   Smokeless tobacco: Never  Vaping Use   Vaping status: Never Used  Substance and Sexual Activity   Alcohol  use: No    Alcohol /week: 0.0 standard drinks of alcohol    Drug use: No   Sexual activity: Never  Other Topics Concern   Not on file  Social History Narrative   09/21/21 resides at [facility]      Lives alone, son lives beside her   Caffeine - none   Social Drivers of Health   Financial Resource Strain: Low Risk  (05/13/2020)   Overall Financial Resource Strain (CARDIA)  Difficulty of Paying Living Expenses: Not hard at all  Food Insecurity: No Food Insecurity (05/13/2020)   Hunger Vital Sign    Worried About Running Out of Food in the Last Year: Never true    Ran Out of Food in the Last Year: Never true  Transportation Needs: No Transportation Needs (05/13/2020)   PRAPARE - Administrator, Civil Service (Medical): No    Lack of Transportation (Non-Medical): No  Physical Activity: Inactive (05/13/2020)   Exercise Vital Sign    Days of Exercise per Week: 0 days    Minutes of Exercise per Session: 0 min  Stress: No Stress Concern Present (05/13/2020)   [employer] of Occupational Health - Occupational Stress  Questionnaire    Feeling of Stress : Not at all  Social Connections: Socially Isolated (05/13/2020)   Social Connection and Isolation Panel    Frequency of Communication with Friends and Family: More than three times a week    Frequency of Social Gatherings with Friends and Family: More than three times a week    Attends Religious Services: Never    Database administrator or Organizations: No    Attends Banker Meetings: Never    Marital Status: Widowed  Intimate Partner Violence: Not At Risk (05/13/2020)   Humiliation, Afraid, Rape, and Kick questionnaire    Fear of Current or Ex-Partner: No    Emotionally Abused: No    Physically Abused: No    Sexually Abused: No   Family History  Problem Relation Age of Onset   Alzheimer's disease Sister    Cardiomyopathy Brother       VITAL SIGNS BP 138/70   Pulse 75   Ht 4' 11 (1.499 m)   Wt 117 lb 12.8 oz (53.4 kg)   SpO2 98%   BMI 23.79 kg/m   Outpatient Encounter Medications as of 12/07/2023  Medication Sig   Acetaminophen  (TYLENOL  ARTHRITIS PAIN PO) Take 650 mg by mouth 3 (three) times daily.   amiodarone  (PACERONE ) 100 MG tablet Take 100 mg by mouth daily.   amLODipine (NORVASC) 10 MG tablet Take 10 mg by mouth daily.   aspirin  81 MG chewable tablet Chew 1 tablet (81 mg total) by mouth daily.   Camphor-Menthol -Methyl Sal (SALONPAS) 3.05-27-08 % PTCH Apply 1 patch topically daily. At 9 am (Patient taking differently: Apply 2 patches topically daily.)   ergocalciferol  (VITAMIN D2) 1.25 MG (50000 UT) capsule Take 50,000 Units by mouth once a week.   feeding supplement (BOOST HIGH PROTEIN) LIQD Take 1 Container by mouth in the morning and at bedtime. 0.06 gram- 1 kcal/mL; amt: 237ml   hydrochlorothiazide (MICROZIDE) 12.5 MG capsule Take 12.5 mg by mouth daily.   magnesium hydroxide (MILK OF MAGNESIA) 400 MG/5ML suspension Take 30 mLs by mouth daily as needed for mild constipation.   methocarbamol  (ROBAXIN ) 500 MG tablet  Take 250 mg by mouth 3 (three) times daily.   NON FORMULARY Diet - Dysphagia 3   ondansetron  (ZOFRAN -ODT) 4 MG disintegrating tablet Take 4 mg by mouth every 8 (eight) hours as needed for nausea or vomiting.   senna-docusate (SENNA S) 8.6-50 MG tablet Take 1 tablet by mouth 2 (two) times daily. (Patient taking differently: Take 1 tablet by mouth daily.)   No facility-administered encounter medications on file as of 12/07/2023.     SIGNIFICANT DIAGNOSTIC EXAMS   LABS REVIEWED; PREVIOUS    01-11-23: d-dimer: 0.40 04-03-23: wbc 10.5; hgb 12.7; hct 39.6; mcv 96.6 plt 292;  glucose 124; bun 11; creat 0.82; k+ 3.3; na++ 137; ca 9.2; gfr >60; urine culture: e-coli: augmentin  04-09-23: k+ 3.9 04-10-23: wbc 7.4; hgb 14.0; hct 44.1; mcv 96.1 plt 256; glucose 126; bun 18; creat 0.97; k+ 3.6; na++ 134; ca 8.7; gfr 55 08-23-23: tsh 1.577; vitamin D  25.99   10-18-23: glucose 79; bun 19; creat 0.88; k+ 3.5; na++ 139; ca 9.0; gfr >60  11-01-23: wbc 4.8; hgb 11.0; hct 35.2; mcv 96.2 plt 245; glucose 82; bun 19; creat 0.96; k+ 3.7; na++ 139; ca 8.9; gfr 56; protein 6.1 albumin 3.2  NO NEW LABS.     Review of Systems  Constitutional:  Negative for malaise/fatigue.  Respiratory:  Negative for cough and shortness of breath.   Cardiovascular:  Negative for chest pain, palpitations and leg swelling.  Gastrointestinal:  Negative for abdominal pain, constipation and heartburn.  Musculoskeletal:  Negative for back pain, joint pain and myalgias.  Skin: Negative.   Neurological:  Negative for dizziness.  Psychiatric/Behavioral:  The patient is not nervous/anxious.    Physical Exam Constitutional:      General: She is not in acute distress.    Appearance: She is well-developed. She is not diaphoretic.  Neck:     Thyroid : No thyromegaly.  Cardiovascular:     Rate and Rhythm: Normal rate and regular rhythm.     Pulses: Normal pulses.     Heart sounds: Normal heart sounds.  Pulmonary:     Effort: Pulmonary  effort is normal. No respiratory distress.     Breath sounds: Normal breath sounds.  Abdominal:     General: Bowel sounds are normal. There is no distension.     Palpations: Abdomen is soft.     Tenderness: There is no abdominal tenderness.  Musculoskeletal:        General: Normal range of motion.     Cervical back: Neck supple.     Right lower leg: No edema.     Left lower leg: No edema.  Lymphadenopathy:     Cervical: No cervical adenopathy.  Skin:    General: Skin is warm and dry.  Neurological:     Mental Status: She is alert. Mental status is at baseline.     Comments: SLUMS 10/30   Psychiatric:        Mood and Affect: Mood normal.      ASSESSMENT/ PLAN:  TODAY  Aortic atherosclerosis Chronic kidney disease stage 3b Neurocognitive deficits  Will continue current medications Will continue current plan of care Will continue to monitor her status.   Time spent with patient: 40 minutes: medications; plan of care; dietary.      Barnie Seip NP [facility]   call [phone]

## 2023-12-17 DIAGNOSIS — I10 Essential (primary) hypertension: Secondary | ICD-10-CM | POA: Diagnosis not present

## 2023-12-19 DIAGNOSIS — I739 Peripheral vascular disease, unspecified: Secondary | ICD-10-CM | POA: Diagnosis not present

## 2023-12-19 DIAGNOSIS — L602 Onychogryphosis: Secondary | ICD-10-CM | POA: Diagnosis not present

## 2023-12-28 ENCOUNTER — Non-Acute Institutional Stay (SKILLED_NURSING_FACILITY): Payer: Self-pay | Admitting: Internal Medicine

## 2023-12-28 ENCOUNTER — Encounter: Payer: Self-pay | Admitting: Internal Medicine

## 2023-12-28 DIAGNOSIS — D649 Anemia, unspecified: Secondary | ICD-10-CM | POA: Diagnosis not present

## 2023-12-28 DIAGNOSIS — N1832 Chronic kidney disease, stage 3b: Secondary | ICD-10-CM | POA: Diagnosis not present

## 2023-12-28 DIAGNOSIS — E538 Deficiency of other specified B group vitamins: Secondary | ICD-10-CM | POA: Diagnosis not present

## 2023-12-28 DIAGNOSIS — E440 Moderate protein-calorie malnutrition: Secondary | ICD-10-CM | POA: Diagnosis not present

## 2023-12-28 DIAGNOSIS — E559 Vitamin D deficiency, unspecified: Secondary | ICD-10-CM | POA: Diagnosis not present

## 2023-12-28 NOTE — Assessment & Plan Note (Addendum)
 Chronic albumin is 3.2 and total protein is 6.1.  Limb atrophy &  IOW are present.  Nutritionist to monitor at [facility].

## 2023-12-28 NOTE — Assessment & Plan Note (Addendum)
 Indices on the anemia are normochromic, normocytic; but B12 level has been markedly low at 204 (08/23/2021).  This will be checked with repeat CBC.

## 2023-12-28 NOTE — Assessment & Plan Note (Addendum)
 Repeat vitamin D  to verify correction.

## 2023-12-28 NOTE — Assessment & Plan Note (Signed)
 11/01/2023 H/H 11/35.2, down from values of 14/44.1 on 04/10/2023.  No bleeding dyscrasias reported by staff.  Recheck CBC.

## 2023-12-28 NOTE — Patient Instructions (Signed)
 See assessment and plan under each diagnosis in the problem list and acutely for this visit

## 2023-12-28 NOTE — Assessment & Plan Note (Addendum)
 Current creatinine is 0.96 and GFR 56 compatible with CKD stage III AA  This is in the context of protein/caloric malnutrition.

## 2023-12-28 NOTE — Progress Notes (Signed)
 NURSING HOME LOCATION:  [facility] ROOM NUMBER:  [id number]  CODE STATUS:  DNR  PCP: Landy Barnie RAMAN, NP   This is a nursing facility follow up visit for of chronic medical diagnoses to document compliance with Regulation 483.30 (c) in The Long Term Care Survey Manual Phase 2 which mandates caregiver visit ( visits can alternate among physician, PA or NP as per statutes) within 10 days of 30 days / 60 days/ 90 days post admission to SNF date  .  Interim medical record and care since last SNF visit was updated with review of diagnostic studies and change in clinical status since last visit were documented.  HPI: She is a permanent resident of this facility with medical diagnoses of history of atrial fibrillation, history of bleeding gastric ulcer, history of CVA, history of diverticulosis, history of heart block S/P pacemaker insertion, dyslipidemia, essential hypertension, history of melanoma, osteoporosis, and vitamin D  deficiency. Most recent labs were performed 11/01/2023.  Protein/caloric malnutrition is suggested by an albumin of 3.2 and total protein of 6.1.  Serially there has been some progression of CKD with creatinine rising from 0.88 up to current values of 0.96.  GFR has dropped from greater than 60 down to 56.  There has been progression of anemia with H/H dropping from 14/44.1 to 11/35.2.  Indices were normochromic normocytic.  On 08/23/2023 TSH was therapeutic; this is in the context of amiodarone  therapy.  On that date vit D was 25.99.  Vitamin D  supplementation has been ordered.    Review of systems: Dementia invalidated responses.  She complains of chronic right flank/ back pain for which topical medications are employed with some benefit subjectively.  She relates this to a fall several weeks ago . She also states my feet swell bad, maybe due to my heart monitor.  Despite the anemia she denies any bleeding dyscrasias.  Constitutional: No fever, significant  weight change, fatigue  Eyes: No redness, discharge, pain, vision change ENT/mouth: No nasal congestion,  purulent discharge, earache, change in hearing, sore throat  Cardiovascular: No chest pain, palpitations, paroxysmal nocturnal dyspnea, claudication  Respiratory: No cough, sputum production, hemoptysis, DOE, significant snoring, apnea   Gastrointestinal: No heartburn, dysphagia, abdominal pain, nausea /vomiting, rectal bleeding, melena, change in bowels Genitourinary: No dysuria, hematuria, pyuria, incontinence, nocturia Dermatologic: No rash, pruritus, change in appearance of skin Neurologic: No dizziness, headache, syncope, seizures, numbness, tingling Psychiatric: No significant anxiety, depression, insomnia, anorexia Endocrine: No change in hair/skin/nails, excessive thirst, excessive hunger, excessive urination  Hematologic/lymphatic: No significant bruising, lymphadenopathy, abnormal bleeding Allergy/immunology: No itchy/watery eyes, significant sneezing, urticaria, angioedema  Physical exam:  Pertinent or positive findings: She appears her age and suboptimally nourished.  There is malalignment of the mandibular teeth.  She has a grade 1/2 systolic murmur and slight increase in S2.  There was an inspiratory pop in the right lower lobe on initial deep breath.  There is trace edema at the sock line and 1/2+ at the ankle.  Limb atrophy and interosseous wasting are present.  General appearance:  no acute distress, increased work of breathing is present.   Lymphatic: No lymphadenopathy about the head, neck, axilla. Eyes: No conjunctival inflammation or lid edema is present. There is no scleral icterus. Ears:  External ear exam shows no significant lesions or deformities.   Nose:  External nasal examination shows no deformity or inflammation. Nasal mucosa are pink and moist without lesions, exudates Neck:  No thyromegaly, masses, tenderness noted.  Heart:  No gallop, click, rub .   Lungs:  without wheezes, rhonchi, rales, rubs. Abdomen: Bowel sounds are normal. Abdomen is soft and nontender with no organomegaly, hernias, masses. GU: Deferred  Extremities:  No cyanosis, clubbing  Neurologic exam :Balance, Rhomberg, finger to nose testing could not be completed due to clinical state Skin: Warm & dry w/o tenting. No significant lesions or rash.  See summary under each active problem in the Problem List with associated updated therapeutic plan

## 2023-12-31 ENCOUNTER — Other Ambulatory Visit (HOSPITAL_COMMUNITY)
Admission: RE | Admit: 2023-12-31 | Discharge: 2023-12-31 | Disposition: A | Discharge status: Home / Self Care | Source: Skilled Nursing Facility | Attending: Internal Medicine | Admitting: Internal Medicine

## 2023-12-31 DIAGNOSIS — E559 Vitamin D deficiency, unspecified: Secondary | ICD-10-CM | POA: Insufficient documentation

## 2023-12-31 DIAGNOSIS — D510 Vitamin B12 deficiency anemia due to intrinsic factor deficiency: Secondary | ICD-10-CM | POA: Insufficient documentation

## 2023-12-31 DIAGNOSIS — D649 Anemia, unspecified: Secondary | ICD-10-CM | POA: Diagnosis not present

## 2023-12-31 LAB — VITAMIN D 25 HYDROXY (VIT D DEFICIENCY, FRACTURES): Vit D, 25-Hydroxy: 89.1 ng/mL (ref 30–100)

## 2023-12-31 LAB — CBC
HCT: 36.2 % (ref 36.0–46.0)
Hemoglobin: 11.7 g/dL — ABNORMAL LOW (ref 12.0–15.0)
MCH: 30.7 pg (ref 26.0–34.0)
MCHC: 32.3 g/dL (ref 30.0–36.0)
MCV: 95 fL (ref 80.0–100.0)
Platelets: 232 K/uL (ref 150–400)
RBC: 3.81 MIL/uL — ABNORMAL LOW (ref 3.87–5.11)
RDW: 13.3 % (ref 11.5–15.5)
WBC: 6.1 K/uL (ref 4.0–10.5)
nRBC: 0 % (ref 0.0–0.2)

## 2023-12-31 LAB — VITAMIN B12: Vitamin B-12: 196 pg/mL (ref 180–914)

## 2024-01-01 ENCOUNTER — Other Ambulatory Visit (HOSPITAL_COMMUNITY)
Admission: RE | Admit: 2024-01-01 | Discharge: 2024-01-01 | Disposition: A | Discharge status: Home / Self Care | Source: Skilled Nursing Facility | Attending: Internal Medicine | Admitting: Internal Medicine

## 2024-01-01 ENCOUNTER — Other Ambulatory Visit (HOSPITAL_COMMUNITY): Payer: Self-pay | Admitting: Internal Medicine

## 2024-01-01 DIAGNOSIS — D510 Vitamin B12 deficiency anemia due to intrinsic factor deficiency: Secondary | ICD-10-CM | POA: Diagnosis not present

## 2024-01-01 DIAGNOSIS — E559 Vitamin D deficiency, unspecified: Secondary | ICD-10-CM | POA: Diagnosis not present

## 2024-01-01 DIAGNOSIS — D649 Anemia, unspecified: Secondary | ICD-10-CM | POA: Insufficient documentation

## 2024-01-01 LAB — CBC
HCT: 35.3 % — ABNORMAL LOW (ref 36.0–46.0)
Hemoglobin: 11.4 g/dL — ABNORMAL LOW (ref 12.0–15.0)
MCH: 30.8 pg (ref 26.0–34.0)
MCHC: 32.3 g/dL (ref 30.0–36.0)
MCV: 95.4 fL (ref 80.0–100.0)
Platelets: 245 K/uL (ref 150–400)
RBC: 3.7 MIL/uL — ABNORMAL LOW (ref 3.87–5.11)
RDW: 13.4 % (ref 11.5–15.5)
WBC: 6 K/uL (ref 4.0–10.5)
nRBC: 0 % (ref 0.0–0.2)

## 2024-01-01 LAB — VITAMIN D 25 HYDROXY (VIT D DEFICIENCY, FRACTURES): Vit D, 25-Hydroxy: 90.85 ng/mL (ref 30–100)

## 2024-01-01 LAB — VITAMIN B12: Vitamin B-12: 180 pg/mL (ref 180–914)

## 2024-01-02 ENCOUNTER — Non-Acute Institutional Stay (SKILLED_NURSING_FACILITY): Payer: Self-pay | Admitting: Adult Health

## 2024-01-02 ENCOUNTER — Encounter: Payer: Self-pay | Admitting: Adult Health

## 2024-01-02 DIAGNOSIS — R627 Adult failure to thrive: Secondary | ICD-10-CM

## 2024-01-02 DIAGNOSIS — F0150 Vascular dementia without behavioral disturbance: Secondary | ICD-10-CM | POA: Insufficient documentation

## 2024-01-02 DIAGNOSIS — I10 Essential (primary) hypertension: Secondary | ICD-10-CM | POA: Diagnosis not present

## 2024-01-02 NOTE — Progress Notes (Signed)
 Location:  [facility] Room Number: 108 Place of Service:  SNF (31)   CODE STATUS: dnr   Allergies  Allergen Reactions   Septra  Ds [Sulfamethoxazole -Trimethoprim ]    Lisinopril  Other (See Comments)    Cough    Chief Complaint  Patient presents with   Acute Visit    Advanced directives     HPI:  We have had a discussion with her son regarding her advanced directives. Her family does not want aggressive care. Her pain is presently being managed; there are no reports of anxiety or depressive thoughts.   Past Medical History:  Diagnosis Date   Acute metabolic encephalopathy 07/2021   Atrial fibrillation (HCC)    Bleeding stomach ulcer 1980s?   CVA (cerebral vascular accident) (HCC) 07/2021   DIVERTICULOSIS, COLON 11/22/2006   Headache(784.0)    very often; not regular (11/25/2015)   Heart block    hx of   Hip fracture (HCC) 07/2021   History of blood transfusion 1980s   when I had the bleeding ulcer   HYPERLIPIDEMIA 11/22/2006   Hypertension    Melanoma (HCC)    LLE   Menopausal syndrome (hot flashes)    OSTEOPOROSIS 11/22/2006   Presence of permanent cardiac pacemaker    Vitamin D  deficiency 08/21/2021    Past Surgical History:  Procedure Laterality Date   APPENDECTOMY     BUNIONECTOMY Right    CATARACT EXTRACTION W/ INTRAOCULAR LENS  IMPLANT, BILATERAL Bilateral 2000s   CHOLECYSTECTOMY OPEN     EP IMPLANTABLE DEVICE N/A 11/25/2015   Procedure: Pacemaker Implant;  Surgeon: Will Gladis Norton, MD;  [facility]: [facility];  Service: Cardiovascular;  Laterality: N/A;   FEMUR IM NAIL Left 07/08/2014   Procedure: INTRAMEDULLARY (IM) NAIL ;  Surgeon: Cordella Glendia Hutchinson, MD;  Location: [facility] ORS;  Service: Orthopedics;  Laterality: Left;   FRACTURE SURGERY     HIP ARTHROPLASTY Right 08/20/2021   Procedure: ARTHROPLASTY BIPOLAR HIP (HEMIARTHROPLASTY);  Surgeon: Celena Sharper, MD;  Location: [facility] OR;  Service: Orthopedics;  Laterality:  Right;   IR KYPHO LUMBAR INC FX REDUCE BONE BX UNI/BIL CANNULATION INC/IMAGING  03/21/2021   PACEMAKER INSERTION     REVISION TOTAL HIP ARTHROPLASTY Left 06/2014    Social History   Socioeconomic History   Marital status: Widowed    Spouse name: Not on file   Number of children: 2   Years of education: 12   Highest education level: Not on file  Occupational History    Comment: retired  Tobacco Use   Smoking status: Never    Passive exposure: Never   Smokeless tobacco: Never  Vaping Use   Vaping status: Never Used  Substance and Sexual Activity   Alcohol  use: No    Alcohol /week: 0.0 standard drinks of alcohol    Drug use: No   Sexual activity: Never  Other Topics Concern   Not on file  Social History Narrative   09/21/21 resides at [facility]      Lives alone, son lives beside her   Caffeine - none   Social Drivers of Health   Financial Resource Strain: Low Risk  (05/13/2020)   Overall Financial Resource Strain (CARDIA)    Difficulty of Paying Living Expenses: Not hard at all  Food Insecurity: No Food Insecurity (05/13/2020)   Hunger Vital Sign    Worried About Running Out of Food in the Last Year: Never true    Ran Out of Food in the Last Year: Never  true  Transportation Needs: No Transportation Needs (05/13/2020)   PRAPARE - Administrator, Civil Service (Medical): No    Lack of Transportation (Non-Medical): No  Physical Activity: Inactive (05/13/2020)   Exercise Vital Sign    Days of Exercise per Week: 0 days    Minutes of Exercise per Session: 0 min  Stress: No Stress Concern Present (05/13/2020)   [employer] of Occupational Health - Occupational Stress Questionnaire    Feeling of Stress : Not at all  Social Connections: Socially Isolated (05/13/2020)   Social Connection and Isolation Panel    Frequency of Communication with Friends and Family: More than three times a week    Frequency of Social Gatherings with Friends and Family: More than  three times a week    Attends Religious Services: Never    Database administrator or Organizations: No    Attends Banker Meetings: Never    Marital Status: Widowed  Intimate Partner Violence: Not At Risk (05/13/2020)   Humiliation, Afraid, Rape, and Kick questionnaire    Fear of Current or Ex-Partner: No    Emotionally Abused: No    Physically Abused: No    Sexually Abused: No   Family History  Problem Relation Age of Onset   Alzheimer's disease Sister    Cardiomyopathy Brother       VITAL SIGNS BP 138/68   Pulse (!) 58   Temp 97.8 F (36.6 C)   Resp 18   Ht 4' 11 (1.499 m)   Wt 119 lb 9.6 oz (54.3 kg)   SpO2 100%   BMI 24.16 kg/m   Outpatient Encounter Medications as of 01/02/2024  Medication Sig   Acetaminophen  (TYLENOL  ARTHRITIS PAIN PO) Take 650 mg by mouth 3 (three) times daily.   amiodarone  (PACERONE ) 100 MG tablet Take 100 mg by mouth daily.   amLODipine (NORVASC) 10 MG tablet Take 10 mg by mouth daily.   aspirin  81 MG chewable tablet Chew 1 tablet (81 mg total) by mouth daily.   Camphor-Menthol -Methyl Sal (SALONPAS) 3.05-27-08 % PTCH Apply 1 patch topically daily. At 9 am (Patient taking differently: Apply 2 patches topically daily.)   ergocalciferol  (VITAMIN D2) 1.25 MG (50000 UT) capsule Take 50,000 Units by mouth once a week.   feeding supplement (BOOST HIGH PROTEIN) LIQD Take 1 Container by mouth in the morning and at bedtime. 0.06 gram- 1 kcal/mL; amt: 237ml   hydrochlorothiazide (MICROZIDE) 12.5 MG capsule Take 12.5 mg by mouth daily.   magnesium hydroxide (MILK OF MAGNESIA) 400 MG/5ML suspension Take 30 mLs by mouth daily as needed for mild constipation.   methocarbamol  (ROBAXIN ) 500 MG tablet Take 250 mg by mouth 3 (three) times daily.   NON FORMULARY Diet - Dysphagia 3   ondansetron  (ZOFRAN -ODT) 4 MG disintegrating tablet Take 4 mg by mouth every 8 (eight) hours as needed for nausea or vomiting.   senna-docusate (SENNA S) 8.6-50 MG tablet  Take 1 tablet by mouth 2 (two) times daily. (Patient taking differently: Take 1 tablet by mouth daily.)   No facility-administered encounter medications on file as of 01/02/2024.     SIGNIFICANT DIAGNOSTIC EXAMS  LABS REVIEWED; PREVIOUS    01-11-23: d-dimer: 0.40 04-03-23: wbc 10.5; hgb 12.7; hct 39.6; mcv 96.6 plt 292; glucose 124; bun 11; creat 0.82; k+ 3.3; na++ 137; ca 9.2; gfr >60; urine culture: e-coli: augmentin  04-09-23: k+ 3.9 04-10-23: wbc 7.4; hgb 14.0; hct 44.1; mcv 96.1 plt 256; glucose 126; bun 18;  creat 0.97; k+ 3.6; na++ 134; ca 8.7; gfr 55 08-23-23: tsh 1.577; vitamin D  25.99   10-18-23: glucose 79; bun 19; creat 0.88; k+ 3.5; na++ 139; ca 9.0; gfr >60  11-01-23: wbc 4.8; hgb 11.0; hct 35.2; mcv 96.2 plt 245; glucose 82; bun 19; creat 0.96; k+ 3.7; na++ 139; ca 8.9; gfr 56; protein 6.1 albumin 3.2  NO NEW LABS.     Review of Systems  Constitutional:  Negative for malaise/fatigue.  Respiratory:  Negative for cough and shortness of breath.   Cardiovascular:  Negative for chest pain, palpitations and leg swelling.  Gastrointestinal:  Negative for abdominal pain, constipation and heartburn.  Musculoskeletal:  Negative for back pain, joint pain and myalgias.  Skin: Negative.   Neurological:  Negative for dizziness.  Psychiatric/Behavioral:  The patient is not nervous/anxious.     Physical Exam Constitutional:      General: She is not in acute distress.    Appearance: She is well-developed. She is not diaphoretic.  Neck:     Thyroid : No thyromegaly.  Cardiovascular:     Rate and Rhythm: Normal rate and regular rhythm.     Heart sounds: Normal heart sounds.  Pulmonary:     Effort: Pulmonary effort is normal. No respiratory distress.     Breath sounds: Normal breath sounds.  Abdominal:     General: Bowel sounds are normal. There is no distension.     Palpations: Abdomen is soft.     Tenderness: There is no abdominal tenderness.  Musculoskeletal:        General:  Normal range of motion.     Cervical back: Neck supple.     Right lower leg: No edema.     Left lower leg: No edema.  Lymphadenopathy:     Cervical: No cervical adenopathy.  Skin:    General: Skin is warm and dry.  Neurological:     Mental Status: She is alert. Mental status is at baseline.     Comments: SLUMS 10/30  Psychiatric:        Mood and Affect: Mood normal.     ASSESSMENT/ PLAN:  TODAY  Adult failure to thrive Vascular dementia without behavioral disturbance.   MOST Form is filled out: dnr; no hospitalization; no ivf; no tube feeding; ok for abt No further blood work; no further weights.   Time spent with patient: 40 minutes (20 minutes with advanced directives)    Barnie Seip NP [facility]   call [phone]

## 2024-01-29 ENCOUNTER — Encounter: Payer: Self-pay | Admitting: Adult Health

## 2024-01-29 ENCOUNTER — Non-Acute Institutional Stay (SKILLED_NURSING_FACILITY): Payer: Self-pay | Admitting: Adult Health

## 2024-01-29 DIAGNOSIS — G8929 Other chronic pain: Secondary | ICD-10-CM | POA: Diagnosis not present

## 2024-01-29 DIAGNOSIS — M545 Low back pain, unspecified: Secondary | ICD-10-CM | POA: Diagnosis not present

## 2024-01-29 DIAGNOSIS — E782 Mixed hyperlipidemia: Secondary | ICD-10-CM

## 2024-01-29 DIAGNOSIS — E559 Vitamin D deficiency, unspecified: Secondary | ICD-10-CM

## 2024-01-29 NOTE — Progress Notes (Signed)
 Location:  [facility] Room Number: 54- W Place of Service:  SNF (31)   CODE STATUS: DNR  Allergies  Allergen Reactions   Septra  Ds [Sulfamethoxazole -Trimethoprim ]    Lisinopril  Other (See Comments)    Cough    Chief Complaint  Patient presents with   Medical Management of Chronic Issues          Mixed hyperlipidemia: Chronic low back pain:   Vitamin D  deficiency:    HPI:  She is a 89 y.o. long term resident of this facility being seen for the management of her chronic illnesses:Mixed hyperlipidemia: Chronic low back pain:   Vitamin D  deficiency. There are no reports of uncontrolled pain. She tells me today that she is feeling good without any pain.    Past Medical History:  Diagnosis Date   Acute metabolic encephalopathy 07/2021   Atrial fibrillation (HCC)    Bleeding stomach ulcer 1980s?   CVA (cerebral vascular accident) (HCC) 07/2021   DIVERTICULOSIS, COLON 11/22/2006   Headache(784.0)    very often; not regular (11/25/2015)   Heart block    hx of   Hip fracture (HCC) 07/2021   History of blood transfusion 1980s   when I had the bleeding ulcer   HYPERLIPIDEMIA 11/22/2006   Hypertension    Melanoma (HCC)    LLE   Menopausal syndrome (hot flashes)    OSTEOPOROSIS 11/22/2006   Presence of permanent cardiac pacemaker    Vitamin D  deficiency 08/21/2021    Past Surgical History:  Procedure Laterality Date   APPENDECTOMY     BUNIONECTOMY Right    CATARACT EXTRACTION W/ INTRAOCULAR LENS  IMPLANT, BILATERAL Bilateral 2000s   CHOLECYSTECTOMY OPEN     EP IMPLANTABLE DEVICE N/A 11/25/2015   Procedure: Pacemaker Implant;  Surgeon: Will Gladis Norton, MD;  Location: [facility];  Service: Cardiovascular;  Laterality: N/A;   FEMUR IM NAIL Left 07/08/2014   Procedure: INTRAMEDULLARY (IM) NAIL ;  Surgeon: Cordella Glendia Hutchinson, MD;  Location: [facility];  Service: Orthopedics;  Laterality: Left;   FRACTURE SURGERY     HIP ARTHROPLASTY Right  08/20/2021   Procedure: ARTHROPLASTY BIPOLAR HIP (HEMIARTHROPLASTY);  Surgeon: Celena Sharper, MD;  Location: [facility] OR;  Service: Orthopedics;  Laterality: Right;   IR KYPHO LUMBAR INC FX REDUCE BONE BX UNI/BIL CANNULATION INC/IMAGING  03/21/2021   PACEMAKER INSERTION     REVISION TOTAL HIP ARTHROPLASTY Left 06/2014    Social History   Socioeconomic History   Marital status: Widowed    Spouse name: Not on file   Number of children: 2   Years of education: 41   Highest education level: Not on file  Occupational History    Comment: retired  Tobacco Use   Smoking status: Never    Passive exposure: Never   Smokeless tobacco: Never  Vaping Use   Vaping status: Never Used  Substance and Sexual Activity   Alcohol  use: No    Alcohol /week: 0.0 standard drinks of alcohol    Drug use: No   Sexual activity: Never  Other Topics Concern   Not on file  Social History Narrative   09/21/21 resides at [facility]      Lives alone, son lives beside her   Caffeine - none   Social Drivers of Health   Financial Resource Strain: Low Risk  (05/13/2020)   Overall Financial Resource Strain (CARDIA)    Difficulty of Paying Living Expenses: Not hard at all  Food Insecurity: No Food Insecurity (  05/13/2020)   Hunger Vital Sign    Worried About Running Out of Food in the Last Year: Never true    Ran Out of Food in the Last Year: Never true  Transportation Needs: No Transportation Needs (05/13/2020)   PRAPARE - Administrator, Civil Service (Medical): No    Lack of Transportation (Non-Medical): No  Physical Activity: Inactive (05/13/2020)   Exercise Vital Sign    Days of Exercise per Week: 0 days    Minutes of Exercise per Session: 0 min  Stress: No Stress Concern Present (05/13/2020)   [employer] of Occupational Health - Occupational Stress Questionnaire    Feeling of Stress : Not at all  Social Connections: Socially Isolated (05/13/2020)   Social Connection and Isolation Panel     Frequency of Communication with Friends and Family: More than three times a week    Frequency of Social Gatherings with Friends and Family: More than three times a week    Attends Religious Services: Never    Database administrator or Organizations: No    Attends Banker Meetings: Never    Marital Status: Widowed  Intimate Partner Violence: Not At Risk (05/13/2020)   Humiliation, Afraid, Rape, and Kick questionnaire    Fear of Current or Ex-Partner: No    Emotionally Abused: No    Physically Abused: No    Sexually Abused: No   Family History  Problem Relation Age of Onset   Alzheimer's disease Sister    Cardiomyopathy Brother       VITAL SIGNS BP 138/68   Pulse 65   Ht 4' 11 (1.499 m)   Wt 122 lb 3.2 oz (55.4 kg)   SpO2 95%   BMI 24.68 kg/m   Outpatient Encounter Medications as of 01/29/2024  Medication Sig   Acetaminophen  (TYLENOL  ARTHRITIS PAIN PO) Take 650 mg by mouth 3 (three) times daily.   aspirin  81 MG chewable tablet Chew 1 tablet (81 mg total) by mouth daily.   Camphor-Menthol -Methyl Sal (SALONPAS) 3.05-27-08 % PTCH Apply 1 patch topically daily. Special Instructions: apply in am and remove at hs [DX: Other low back pain]   cyanocobalamin  (VITAMIN B12) 1000 MCG tablet Take 1,000 mcg by mouth daily.   Ensure (ENSURE) Take 237 mLs by mouth 2 (two) times daily.   magnesium hydroxide (MILK OF MAGNESIA) 400 MG/5ML suspension Take 30 mLs by mouth daily as needed for mild constipation.   methocarbamol  (ROBAXIN ) 500 MG tablet Take 250 mg by mouth 3 (three) times daily.   NON FORMULARY Diet - Dysphagia 3   ondansetron  (ZOFRAN -ODT) 4 MG disintegrating tablet Take 4 mg by mouth every 8 (eight) hours as needed for nausea or vomiting.   senna-docusate (SENNA S) 8.6-50 MG tablet Take 1 tablet by mouth daily.   [DISCONTINUED] feeding supplement (BOOST HIGH PROTEIN) LIQD Take 1 Container by mouth in the morning and at bedtime. 0.06 gram- 1 kcal/mL; amt:   No  facility-administered encounter medications on file as of 01/29/2024.     SIGNIFICANT DIAGNOSTIC EXAMS  LABS REVIEWED; PREVIOUS    04-03-23: wbc 10.5; hgb 12.7; hct 39.6; mcv 96.6 plt 292; glucose 124; bun 11; creat 0.82; k+ 3.3; na++ 137; ca 9.2; gfr >60; urine culture: e-coli: augmentin  04-09-23: k+ 3.9 04-10-23: wbc 7.4; hgb 14.0; hct 44.1; mcv 96.1 plt 256; glucose 126; bun 18; creat 0.97; k+ 3.6; na++ 134; ca 8.7; gfr 55 08-23-23: tsh 1.577; vitamin D  25.99   10-18-23: glucose  79; bun 19; creat 0.88; k+ 3.5; na++ 139; ca 9.0; gfr >60  11-01-23: wbc 4.8; hgb 11.0; hct 35.2; mcv 96.2 plt 245; glucose 82; bun 19; creat 0.96; k+ 3.7; na++ 139; ca 8.9; gfr 56; protein 6.1 albumin 3.2   NO FURTHER LAB WORK   Review of Systems  Constitutional:  Negative for malaise/fatigue.  Respiratory:  Negative for cough and shortness of breath.   Cardiovascular:  Negative for chest pain, palpitations and leg swelling.  Gastrointestinal:  Negative for abdominal pain, constipation and heartburn.  Musculoskeletal:  Negative for back pain, joint pain and myalgias.  Skin: Negative.   Neurological:  Negative for dizziness.  Psychiatric/Behavioral:  The patient is not nervous/anxious.    Physical Exam Constitutional:      General: She is not in acute distress.    Appearance: She is well-developed. She is not diaphoretic.  Neck:     Thyroid : No thyromegaly.  Cardiovascular:     Rate and Rhythm: Normal rate and regular rhythm.     Heart sounds: Normal heart sounds.  Pulmonary:     Effort: Pulmonary effort is normal. No respiratory distress.     Breath sounds: Normal breath sounds.  Abdominal:     General: Bowel sounds are normal. There is no distension.     Palpations: Abdomen is soft.     Tenderness: There is no abdominal tenderness.  Musculoskeletal:        General: Normal range of motion.     Cervical back: Neck supple.     Right lower leg: No edema.     Left lower leg: No edema.   Lymphadenopathy:     Cervical: No cervical adenopathy.  Skin:    General: Skin is warm and dry.  Neurological:     Mental Status: She is alert. Mental status is at baseline.     Comments: SLUMS 10/30  Psychiatric:        Mood and Affect: Mood normal.     ASSESSMENT/ PLAN:  TODAY  Mixed hyperlipidemia: is off statin due to advanced age  2. Chronic low back pain: will continue robaxin  250 mg three times daily; tylenol  650 mg three times daily    3. Vitamin D  deficiency: level 25.99 will monitor   PREVIOUS   4. Vitamin B 12 deficiency: level 204   5. Aortic atherosclerosis (ct 08-02-21) is on asa; off statin due to advanced age  75. Post menopausal osteoporosis: t score -4.184; will monitor   7. Protein calorie malnutrition: albumin 3.1 will continue supplements  as directed.   8. History of CVA (cerebrovascular accident) is on asa 81 mg daily   9. Atrial fibrillation/sick sinus syndrome: is status post pace maker. Will continue amiodarone  100 mg daily for rate control and asa 81 mg daily   10. Hypertension associated with stage 3 chronic kidney disease: b/p 138/68 will continue norvasc 10 mg daily   11. Gastroesophageal reflux disease without esophagitis: will monitor   12. Chronic kidney disease stage 3b: bun 19; creat 0.96; gfr 56  13. Anemia due to chronic kidney disease: hgb 11.0      Barnie Seip NP [facility]  call [phone]

## 2024-02-05 DIAGNOSIS — I10 Essential (primary) hypertension: Secondary | ICD-10-CM | POA: Diagnosis not present

## 2024-02-06 ENCOUNTER — Encounter

## 2024-02-19 DIAGNOSIS — Z9181 History of falling: Secondary | ICD-10-CM | POA: Diagnosis not present

## 2024-02-19 DIAGNOSIS — M6281 Muscle weakness (generalized): Secondary | ICD-10-CM | POA: Diagnosis not present

## 2024-02-19 DIAGNOSIS — I639 Cerebral infarction, unspecified: Secondary | ICD-10-CM | POA: Diagnosis not present

## 2024-02-19 DIAGNOSIS — M810 Age-related osteoporosis without current pathological fracture: Secondary | ICD-10-CM | POA: Diagnosis not present

## 2024-02-19 DIAGNOSIS — R279 Unspecified lack of coordination: Secondary | ICD-10-CM | POA: Diagnosis not present

## 2024-03-22 DEATH — deceased

## 2024-05-07 ENCOUNTER — Encounter

## 2024-08-06 ENCOUNTER — Encounter

## 2024-11-05 ENCOUNTER — Encounter
